# Patient Record
Sex: Male | Born: 1950 | Race: White | Hispanic: No | Marital: Single | Smoking: Never smoker
Health system: Southern US, Community
[De-identification: ages and names within clinical notes are randomized; demographics above are authoritative.]

## PROBLEM LIST (undated history)

## (undated) DIAGNOSIS — R7303 Prediabetes: Secondary | ICD-10-CM

## (undated) DIAGNOSIS — F329 Major depressive disorder, single episode, unspecified: Secondary | ICD-10-CM

## (undated) DIAGNOSIS — I9589 Other hypotension: Secondary | ICD-10-CM

## (undated) DIAGNOSIS — F988 Other specified behavioral and emotional disorders with onset usually occurring in childhood and adolescence: Secondary | ICD-10-CM

## (undated) DIAGNOSIS — M179 Osteoarthritis of knee, unspecified: Secondary | ICD-10-CM

## (undated) DIAGNOSIS — F32A Depression, unspecified: Secondary | ICD-10-CM

## (undated) DIAGNOSIS — I4891 Unspecified atrial fibrillation: Secondary | ICD-10-CM

## (undated) DIAGNOSIS — M1710 Unilateral primary osteoarthritis, unspecified knee: Secondary | ICD-10-CM

## (undated) DIAGNOSIS — G4730 Sleep apnea, unspecified: Secondary | ICD-10-CM

## (undated) DIAGNOSIS — I350 Nonrheumatic aortic (valve) stenosis: Secondary | ICD-10-CM

## (undated) DIAGNOSIS — I4892 Unspecified atrial flutter: Secondary | ICD-10-CM

## (undated) DIAGNOSIS — E7800 Pure hypercholesterolemia, unspecified: Secondary | ICD-10-CM

## (undated) DIAGNOSIS — J189 Pneumonia, unspecified organism: Secondary | ICD-10-CM

## (undated) DIAGNOSIS — J45909 Unspecified asthma, uncomplicated: Secondary | ICD-10-CM

## (undated) DIAGNOSIS — E010 Iodine-deficiency related diffuse (endemic) goiter: Secondary | ICD-10-CM

## (undated) DIAGNOSIS — Z87442 Personal history of urinary calculi: Secondary | ICD-10-CM

## (undated) DIAGNOSIS — I499 Cardiac arrhythmia, unspecified: Secondary | ICD-10-CM

## (undated) DIAGNOSIS — D696 Thrombocytopenia, unspecified: Secondary | ICD-10-CM

## (undated) DIAGNOSIS — K8020 Calculus of gallbladder without cholecystitis without obstruction: Secondary | ICD-10-CM

## (undated) DIAGNOSIS — I8390 Asymptomatic varicose veins of unspecified lower extremity: Secondary | ICD-10-CM

## (undated) DIAGNOSIS — I951 Orthostatic hypotension: Secondary | ICD-10-CM

## (undated) DIAGNOSIS — R918 Other nonspecific abnormal finding of lung field: Secondary | ICD-10-CM

## (undated) DIAGNOSIS — Z8711 Personal history of peptic ulcer disease: Secondary | ICD-10-CM

## (undated) HISTORY — DX: Unspecified atrial fibrillation: I48.91

## (undated) HISTORY — DX: Asymptomatic varicose veins of unspecified lower extremity: I83.90

## (undated) HISTORY — PX: KNEE ARTHROSCOPY: SHX127

## (undated) HISTORY — DX: Sleep apnea, unspecified: G47.30

## (undated) HISTORY — DX: Iodine-deficiency related diffuse (endemic) goiter: E01.0

## (undated) HISTORY — DX: Other specified behavioral and emotional disorders with onset usually occurring in childhood and adolescence: F98.8

## (undated) HISTORY — DX: Pure hypercholesterolemia, unspecified: E78.00

## (undated) HISTORY — DX: Thrombocytopenia, unspecified: D69.6

## (undated) HISTORY — PX: GASTRIC ROUX-EN-Y: SHX5262

## (undated) HISTORY — DX: Osteoarthritis of knee, unspecified: M17.9

## (undated) HISTORY — DX: Major depressive disorder, single episode, unspecified: F32.9

## (undated) HISTORY — PX: ULNAR NERVE TRANSPOSITION: SHX2595

## (undated) HISTORY — DX: Nonrheumatic aortic (valve) stenosis: I35.0

## (undated) HISTORY — DX: Unilateral primary osteoarthritis, unspecified knee: M17.10

## (undated) HISTORY — DX: Prediabetes: R73.03

## (undated) HISTORY — DX: Orthostatic hypotension: I95.1

## (undated) HISTORY — DX: Depression, unspecified: F32.A

## (undated) HISTORY — DX: Unspecified atrial flutter: I48.92

## (undated) HISTORY — PX: TONSILLECTOMY: SUR1361

## (undated) HISTORY — DX: Other nonspecific abnormal finding of lung field: R91.8

## (undated) HISTORY — DX: Calculus of gallbladder without cholecystitis without obstruction: K80.20

---

## 2005-07-24 DIAGNOSIS — F419 Anxiety disorder, unspecified: Secondary | ICD-10-CM

## 2005-07-24 HISTORY — DX: Anxiety disorder, unspecified: F41.9

## 2006-04-04 ENCOUNTER — Encounter: Admission: RE | Admit: 2006-04-04 | Discharge: 2006-04-04 | Payer: Self-pay | Admitting: Geriatric Medicine

## 2006-09-06 ENCOUNTER — Ambulatory Visit (HOSPITAL_COMMUNITY): Admission: RE | Admit: 2006-09-06 | Discharge: 2006-09-06 | Payer: Self-pay | Admitting: Geriatric Medicine

## 2006-12-12 ENCOUNTER — Encounter: Admission: RE | Admit: 2006-12-12 | Discharge: 2006-12-12 | Payer: Self-pay | Admitting: Geriatric Medicine

## 2007-05-21 ENCOUNTER — Encounter: Admission: RE | Admit: 2007-05-21 | Discharge: 2007-05-21 | Payer: Self-pay | Admitting: Geriatric Medicine

## 2008-09-23 ENCOUNTER — Ambulatory Visit: Payer: Self-pay | Admitting: Vascular Surgery

## 2008-11-11 ENCOUNTER — Ambulatory Visit: Payer: Self-pay | Admitting: Vascular Surgery

## 2008-11-18 ENCOUNTER — Ambulatory Visit: Payer: Self-pay | Admitting: Vascular Surgery

## 2008-11-25 ENCOUNTER — Ambulatory Visit: Payer: Self-pay | Admitting: Vascular Surgery

## 2008-12-02 ENCOUNTER — Ambulatory Visit: Payer: Self-pay | Admitting: Vascular Surgery

## 2009-05-20 ENCOUNTER — Inpatient Hospital Stay (HOSPITAL_COMMUNITY): Admission: EM | Admit: 2009-05-20 | Discharge: 2009-05-24 | Payer: Self-pay | Admitting: Emergency Medicine

## 2009-05-20 ENCOUNTER — Ambulatory Visit: Payer: Self-pay | Admitting: Cardiovascular Disease

## 2009-05-20 DIAGNOSIS — J939 Pneumothorax, unspecified: Secondary | ICD-10-CM

## 2009-05-20 HISTORY — DX: Pneumothorax, unspecified: J93.9

## 2009-05-21 ENCOUNTER — Encounter (INDEPENDENT_AMBULATORY_CARE_PROVIDER_SITE_OTHER): Payer: Self-pay | Admitting: General Surgery

## 2009-05-26 ENCOUNTER — Ambulatory Visit: Payer: Self-pay | Admitting: Cardiovascular Disease

## 2009-05-28 ENCOUNTER — Telehealth: Payer: Self-pay | Admitting: Cardiovascular Disease

## 2009-05-31 ENCOUNTER — Ambulatory Visit: Payer: Self-pay | Admitting: Cardiovascular Disease

## 2009-05-31 ENCOUNTER — Encounter (INDEPENDENT_AMBULATORY_CARE_PROVIDER_SITE_OTHER): Payer: Self-pay | Admitting: *Deleted

## 2009-05-31 DIAGNOSIS — G4733 Obstructive sleep apnea (adult) (pediatric): Secondary | ICD-10-CM | POA: Insufficient documentation

## 2009-05-31 DIAGNOSIS — Z8639 Personal history of other endocrine, nutritional and metabolic disease: Secondary | ICD-10-CM

## 2009-05-31 DIAGNOSIS — Z862 Personal history of diseases of the blood and blood-forming organs and certain disorders involving the immune mechanism: Secondary | ICD-10-CM | POA: Insufficient documentation

## 2009-05-31 DIAGNOSIS — F988 Other specified behavioral and emotional disorders with onset usually occurring in childhood and adolescence: Secondary | ICD-10-CM | POA: Insufficient documentation

## 2009-05-31 DIAGNOSIS — E785 Hyperlipidemia, unspecified: Secondary | ICD-10-CM | POA: Insufficient documentation

## 2009-05-31 DIAGNOSIS — K219 Gastro-esophageal reflux disease without esophagitis: Secondary | ICD-10-CM | POA: Insufficient documentation

## 2009-05-31 DIAGNOSIS — F32A Depression, unspecified: Secondary | ICD-10-CM | POA: Insufficient documentation

## 2009-05-31 DIAGNOSIS — F341 Dysthymic disorder: Secondary | ICD-10-CM | POA: Insufficient documentation

## 2009-10-21 ENCOUNTER — Inpatient Hospital Stay (HOSPITAL_COMMUNITY): Admission: EM | Admit: 2009-10-21 | Discharge: 2009-10-23 | Payer: Self-pay | Admitting: Emergency Medicine

## 2010-06-29 ENCOUNTER — Encounter (INDEPENDENT_AMBULATORY_CARE_PROVIDER_SITE_OTHER): Payer: Self-pay | Admitting: *Deleted

## 2010-08-23 NOTE — Miscellaneous (Signed)
Summary: update med  Clinical Lists Changes  Medications: Changed medication from ATENOLOL 50 MG TABS (ATENOLOL) Take one tablet by mouth daily to ATENOLOL 25 MG TABS (ATENOLOL) Take one tablet by mouth daily

## 2010-10-12 LAB — CBC
HCT: 31.1 % — ABNORMAL LOW (ref 39.0–52.0)
HCT: 33.3 % — ABNORMAL LOW (ref 39.0–52.0)
Hemoglobin: 10.4 g/dL — ABNORMAL LOW (ref 13.0–17.0)
Hemoglobin: 10.6 g/dL — ABNORMAL LOW (ref 13.0–17.0)
Hemoglobin: 11.2 g/dL — ABNORMAL LOW (ref 13.0–17.0)
Hemoglobin: 11.4 g/dL — ABNORMAL LOW (ref 13.0–17.0)
MCHC: 34.1 g/dL (ref 30.0–36.0)
MCHC: 34.2 g/dL (ref 30.0–36.0)
MCHC: 34.3 g/dL (ref 30.0–36.0)
MCV: 94.6 fL (ref 78.0–100.0)
MCV: 94.8 fL (ref 78.0–100.0)
Platelets: 202 10*3/uL (ref 150–400)
Platelets: 210 10*3/uL (ref 150–400)
Platelets: 218 10*3/uL (ref 150–400)
RBC: 3.21 MIL/uL — ABNORMAL LOW (ref 4.22–5.81)
RBC: 3.58 MIL/uL — ABNORMAL LOW (ref 4.22–5.81)
RDW: 14.2 % (ref 11.5–15.5)
RDW: 14.3 % (ref 11.5–15.5)
WBC: 6.3 10*3/uL (ref 4.0–10.5)
WBC: 6.6 10*3/uL (ref 4.0–10.5)
WBC: 6.7 10*3/uL (ref 4.0–10.5)
WBC: 6.9 10*3/uL (ref 4.0–10.5)

## 2010-10-12 LAB — BASIC METABOLIC PANEL
GFR calc non Af Amer: >60 mL/min (ref >60)
Potassium: 4.4 mEq/L (ref 3.5–5.1)
Sodium: 142 mEq/L (ref 135–145)

## 2010-10-12 LAB — COMPREHENSIVE METABOLIC PANEL
ALT: 20 U/L (ref 0–53)
CO2: 29 mEq/L (ref 19–32)
Calcium: 9.6 mg/dL (ref 8.4–10.5)
Chloride: 104 mEq/L (ref 96–112)
Creatinine, Ser: 0.9 mg/dL (ref 0.4–1.5)
GFR calc non Af Amer: >60 mL/min (ref >60)
Glucose, Bld: 87 mg/dL (ref 70–99)
Potassium: 4.8 mEq/L (ref 3.5–5.1)
Sodium: 138 mEq/L (ref 135–145)
Total Bilirubin: 0.7 mg/dL (ref 0.3–1.2)

## 2010-10-12 LAB — GLUCOSE, CAPILLARY
Glucose-Capillary: 80 mg/dL (ref 70–99)
Glucose-Capillary: 85 mg/dL (ref 70–99)

## 2010-10-12 LAB — CORTISOL: Cortisol, Plasma: 9.8 ug/dL

## 2010-10-12 LAB — PROTIME-INR: INR: 1.2 (ref 0.00–1.49)

## 2010-10-12 LAB — TSH
TSH: 1.492 u[IU]/mL (ref 0.350–4.500)
TSH: 1.736 u[IU]/mL (ref 0.350–4.500)

## 2010-10-16 LAB — HEMOCCULT GUIAC POC 1CARD (OFFICE): Fecal Occult Bld: POSITIVE

## 2010-10-16 LAB — ABO/RH: ABO/RH(D): O POS

## 2010-10-16 LAB — POCT I-STAT, CHEM 8
HCT: 39 % (ref 39.0–52.0)
Hemoglobin: 13.3 g/dL (ref 13.0–17.0)
Potassium: 4.2 mEq/L (ref 3.5–5.1)
Sodium: 139 mEq/L (ref 135–145)
TCO2: 30 mmol/L (ref 0–100)

## 2010-10-16 LAB — TYPE AND SCREEN
ABO/RH(D): O POS
Antibody Screen: NEGATIVE

## 2010-10-16 LAB — HEPATIC FUNCTION PANEL
ALT: 21 U/L (ref 0–53)
AST: 23 U/L (ref 0–37)
Albumin: 4.6 g/dL (ref 3.5–5.2)
Bilirubin, Direct: 0.1 mg/dL (ref 0.0–0.3)
Total Bilirubin: 0.6 mg/dL (ref 0.3–1.2)

## 2010-10-16 LAB — URINALYSIS, ROUTINE W REFLEX MICROSCOPIC
Nitrite: NEGATIVE
Specific Gravity, Urine: 1.024 (ref 1.005–1.030)
pH: 5.5 (ref 5.0–8.0)

## 2010-10-16 LAB — URINE MICROSCOPIC-ADD ON

## 2010-10-16 LAB — DIFFERENTIAL
Basophils Absolute: 0 10*3/uL (ref 0.0–0.1)
Basophils Relative: 0 % (ref 0–1)
Lymphocytes Relative: 24 % (ref 12–46)
Monocytes Absolute: 0.5 10*3/uL (ref 0.1–1.0)
Monocytes Relative: 6 % (ref 3–12)
Neutro Abs: 5.1 10*3/uL (ref 1.7–7.7)
Neutrophils Relative %: 69 % (ref 43–77)

## 2010-10-16 LAB — POCT CARDIAC MARKERS
CKMB, poc: <1 ng/mL — ABNORMAL LOW (ref 1.0–8.0)
Troponin i, poc: <0.05 ng/mL (ref 0.00–0.09)

## 2010-10-16 LAB — CBC
Hemoglobin: 12.2 g/dL — ABNORMAL LOW (ref 13.0–17.0)
RBC: 3.77 MIL/uL — ABNORMAL LOW (ref 4.22–5.81)

## 2010-10-16 LAB — PROTIME-INR: INR: 1.04 (ref 0.00–1.49)

## 2010-10-16 LAB — URINE CULTURE

## 2010-10-27 LAB — BASIC METABOLIC PANEL
BUN: 16 mg/dL (ref 6–23)
CO2: 29 mEq/L (ref 19–32)
Calcium: 9 mg/dL (ref 8.4–10.5)
Chloride: 102 mEq/L (ref 96–112)
Chloride: 107 mEq/L (ref 96–112)
Creatinine, Ser: 0.79 mg/dL (ref 0.4–1.5)
GFR calc Af Amer: >60 mL/min (ref >60)
GFR calc Af Amer: >60 mL/min (ref >60)
GFR calc non Af Amer: >60 mL/min (ref >60)
Glucose, Bld: 103 mg/dL — ABNORMAL HIGH (ref 70–99)
Potassium: 4 mEq/L (ref 3.5–5.1)
Sodium: 138 mEq/L (ref 135–145)

## 2010-10-27 LAB — CBC
HCT: 41.7 % (ref 39.0–52.0)
Hemoglobin: 14.4 g/dL (ref 13.0–17.0)
MCHC: 34.5 g/dL (ref 30.0–36.0)
Platelets: 161 10*3/uL (ref 150–400)
RBC: 4.43 MIL/uL (ref 4.22–5.81)
RBC: 4.98 MIL/uL (ref 4.22–5.81)
WBC: 7.7 10*3/uL (ref 4.0–10.5)

## 2010-10-27 LAB — DIFFERENTIAL
Eosinophils Absolute: 0.2 10*3/uL (ref 0.0–0.7)
Lymphocytes Relative: 22 % (ref 12–46)
Lymphs Abs: 1.7 10*3/uL (ref 0.7–4.0)
Monocytes Relative: 8 % (ref 3–12)
Neutro Abs: 5.3 10*3/uL (ref 1.7–7.7)
Neutrophils Relative %: 68 % (ref 43–77)

## 2010-12-06 NOTE — Consult Note (Signed)
NEW PATIENT CONSULTATION   CAREY, JOHNDROW  DOB:  1950/10/07                                       09/23/2008  ZOXWR#:[id number]   The patient presents today for evaluation of bilateral severe varicose  veins.  He is a very pleasant 60 year old gentleman with progressively  severe bilateral lower extremity varicosities.  He reports that these  have been present since his teens to 89s.  He has had progressive pain  over the past several years with more pain specifically around his left  medial calf and ankle and has had two episodes of bleeding from his  varicosities at the level of his ankle on the left.  He does have very  strong family history of venous varicosities in his family, does have a  history of arterial insufficiency with eventual amputation in his  mother.  He reports that he does have pain specifically over the  varicosity and also a heavy crampy, achy sensation over his calves with  prolonged standing. His job in Therapist, sports requires prolonged  standing and walking, and these activities are very difficult due to  bilateral leg pain and swelling. Mr. Bautch also reports housework,  yardwork and exercise are also very difficult for him secondary to leg  pain and swelling.  He does have swelling and does have changes of  chronic venous hypertension, more particularly in his left than his  right leg.  He does wear compression garments and has so for 2 years.  These were prescribed by Dr. Aldean Baker.  He elevates legs when  possible and does take nonsteroidal inflammatories for discomfort when  possible.   PAST HISTORY:  Negative for diabetes, hypertension, elevated cholesterol  or cardiac disease.   SOCIAL HISTORY:  He is single.  He works Therapist, sports.  He does  not smoke, drink alcohol.  He did have a gastric bypass in the last 1-2  years and has lost weight, down to 260 pounds, he is 6 feet 4 inches  tall.  He denies any cardiac,  pulmonary or GI difficulties.  He does  have a history of kidney stones in the past.  He does have occasional  dizziness, arthritis and depression and nervousness.   ALLERGIES:  Aspirin.   PHYSICAL EXAMINATION:  A well-developed, well-nourished white male  appearing stated age 29.  His radial and dorsalis pedis pulses are 2+  bilaterally.  He has huge bilateral venous varicosities, these are most  prominent in his right thigh and of both calves.  He does have  thickening and a hemosiderin deposit at the level of his left ankle.   He underwent a noninvasive vascular laboratory study in our office that  reveals gross reflux through his saphenous vein bilaterally. He does  have mild reflux in common femoral veins bilaterally, and he does have  reflux in his left femoral vein.  I had a long discussion with the  patient regarding his venous pathology.  He clearly has failed  conservative therapy.  I have recommended staged bilateral laser  ablation of his great saphenous vein and stab phlebectomy of his  multiple varicosities.  He reports that this procedure had been denied  by his insurance carrier a year ago.  He had seen Dr. Chapman Moss at [facility].  I am quite surprised at this.  He clearly has  gross reflux  with progressive stage IV venous hypertension.  He has also had two  episodes of bleeding secondary to his varicosities.  He understands the  procedure and we will attempt to assure insurance coverage for him and  then proceed with staged bilateral left then right leg laser ablation  with stab phlebectomy.   Larina Earthly, M.D.  Electronically Signed   TFE/MEDQ  D:  09/23/2008  T:  09/24/2008  Job:  [id number]   cc:   Hal T. Stoneking, M.D.  Nadara Mustard, MD

## 2010-12-06 NOTE — Assessment & Plan Note (Signed)
OFFICE VISIT   RAJIV, PARLATO  DOB:  [birth year]                                       12/02/2008  CHART#:[id number]   The patient presents today in 1 week followup of his right great  saphenous vein laser ablation and stab phlebectomy of multiple very  large tributary varicosities throughout his thigh and calf.  He has the  usual amount of soreness and bruising, and this is resolving.  He  underwent a duplex today and this reveals closure of his great saphenous  vein.  He does have an anterior branch that was not treated that does  remain obviously with flow.  This does not appear to be causing any  difficulty currently.  He does have some known reflux in his small  saphenous veins bilaterally with some tributaries off of these, which  are also is not causing any pain currently.  He will continue his usual  activities and will see [place] again in 6 weeks for final followup.  He will  wear his compression garment right leg for 1 additional week.   Larina Earthly, M.D.  Electronically Signed   TFE/MEDQ  D:  12/02/2008  T:  12/03/2008  Job:  [id number]   cc:   Hal T. Stoneking, M.D.

## 2010-12-06 NOTE — Assessment & Plan Note (Signed)
OFFICE VISIT   ARYEH, BUTTERFIELD  DOB:  June 14, 1951                                       11/18/2008  BJYNW#:[id number]   Patient presents today for followup of his left greater saphenous vein  laser ablation and stab phlebectomy of multiple tributary varicosities 1  week ago.  He did well with the procedure and had minimal discomfort  following.  He looks quite good today with the usual amount of bruising.   He underwent repeat duplex, and this shows closure of the saphenous vein  from the proximal calf up to the groin with no evidence of deep venous  thrombosis.  I am quite pleased with his initial result, as is the  patient.  He has severe hypertension of the right leg and wishes to  proceed as soon as possible.  We have scheduled him for Wednesday, May  5th, for right leg ablation and stab phlebectomy.   He had very large tributary varicosities which were removed with stab  phlebectomy technique, and he did have prolonged oozing from this sites  last week.  Patient understands this, and I explained to him that I  expect he will have similar prolonged oozing with this procedure as  well.  He understands this is simply treated with leg elevation and  compression, as we did last week.  He wishes to proceed on the 5th for  his right leg.   Larina Earthly, M.D.  Electronically Signed   TFE/MEDQ  D:  11/18/2008  T:  11/19/2008  Job:  [id number]   cc:   Hal T. Stoneking, M.D.

## 2010-12-06 NOTE — Procedures (Signed)
DUPLEX DEEP VENOUS EXAM - LOWER EXTREMITY   INDICATION:  Followup evaluation post right greater saphenous vein  ablation.   HISTORY:  Edema:  Mild right leg edema.  Trauma/Surgery:  Right greater saphenous vein laser ablation on  11/25/2008.  Left greater saphenous vein laser ablation on 11/11/2008.  Pain:  Right leg pain.  PE:  No.  Previous DVT:  No.  Anticoagulants:  No.  Other:   DUPLEX EXAM:                CFV   SFV   PopV  PTV    GSV                R  L  R  L  R  L  R   L  R  L  Thrombosis    0  0  0     0     0      +  Spontaneous   +  +  +     +     +      P  Phasic        +  +  +     +     +      0  Augmentation  +  +  +     +     +      0  Compressible  +  +  +     +     +      0  Competent     P  P  +     +     +      P   Legend:  + - yes  o - no  p - partial  D - decreased   IMPRESSION:  1. No evidence of right leg deep vein thrombus.  2. 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  3. The right greater saphenous vein is mostly thrombosed, however,      there is a small patent competent flow channel through the      partially thrombosed proximal greater saphenous vein to a      perforator 7 cm distal to the saphenofemoral junction.  4. The anterior branch of the greater saphenous vein is still patent      and incompetent.    _____________________________  Larina Earthly, M.D.   MC/MEDQ  D:  12/02/2008  T:  12/02/2008  Job:  [id number]

## 2010-12-06 NOTE — Procedures (Signed)
DUPLEX DEEP VENOUS EXAM - LOWER EXTREMITY   INDICATION:  Followup left greater saphenous vein ablation.   HISTORY:  Edema:  Trauma/Surgery:  Left greater saphenous vein ablation 11/11/2008.  Pain:  Right lower extremity.  PE:  No.  Previous DVT:  No.  Anticoagulants:  No.  Other:   DUPLEX EXAM:                CFV   SFV   PopV   PTV   GSV                R  L  R  L  R  L   R  L  R  L  Thrombosis    0  0     0     0      0     +  Spontaneous   +  +     +     +      +     0  Phasic        +  +     +     +      +     0  Augmentation  +  +     +     +      +     0  Compressible  +  +     +     +      +     0  Competent     0  0     D     0            0   Legend:  + - yes  o - no  p - partial  D - decreased   IMPRESSION:  1. No evidence of DVT in right common femoral vein or left lower      extremity.  2. Evidence of ablation without flow from groin to calf in left      greater saphenous vein.  Evidence of minimal flow at saphenofemoral      junction into small proximal lateral branch.  3. Evidence of thrombus in varicose veins noted on left lower      extremity.  4. Evidence of deep venous insufficiency noted in right common femoral      vein and left lower extremity.       _____________________________  Larina Earthly, M.D.   AS/MEDQ  D:  11/18/2008  T:  11/18/2008  Job:  [id number]

## 2010-12-06 NOTE — Procedures (Signed)
LOWER EXTREMITY VENOUS REFLUX EXAM   INDICATION:  Varicose veins.   EXAM:  Using color-flow imaging and pulse Doppler spectral analysis, the  bilateral common femoral, superficial femoral, popliteal, posterior  tibial, greater and lesser saphenous veins are evaluated.  There is  evidence suggesting deep venous insufficiency in the bilateral lower  extremities.   The bilateral saphenofemoral junctions are not competent.  The bilateral  GSV is not competent with the caliber as described below.   The right proximal short saphenous vein demonstrates competency.  The  left proximal short saphenous vein was not adequately visualized.   GSV Diameter (used if found to be incompetent only)                                            Right    Left  Proximal Greater Saphenous Vein           0.71 cm  0.98 cm  Proximal-to-mid-thigh                     0.73 cm  0.95 cm  Mid thigh                                 0.48 cm  1.13 cm  Mid-distal thigh                          0.42 cm  0.98 cm  Distal thigh                              0.42 cm  0.98 cm  Knee                                      0.36 cm  0.86 cm   IMPRESSION:  1. Bilateral greater saphenous vein reflux is identified with the      calibers as described above.  2. The bilateral greater saphenous veins are not tortuous.  3. The deep venous system is not competent at the bilateral common      femoral and left superficial femoral vein levels.  4. An incompetent perforator is noted at the left medial mid-to-distal      calf level, which communicates with the posterior tibial vein.       ___________________________________________  Larina Earthly, M.D.   CH/MEDQ  D:  09/23/2008  T:  09/23/2008  Job:  [id number]

## 2011-01-17 ENCOUNTER — Other Ambulatory Visit: Payer: Self-pay | Admitting: Geriatric Medicine

## 2011-01-17 DIAGNOSIS — N281 Cyst of kidney, acquired: Secondary | ICD-10-CM

## 2011-01-17 DIAGNOSIS — R911 Solitary pulmonary nodule: Secondary | ICD-10-CM

## 2011-01-17 DIAGNOSIS — E049 Nontoxic goiter, unspecified: Secondary | ICD-10-CM

## 2011-01-20 ENCOUNTER — Ambulatory Visit
Admission: RE | Admit: 2011-01-20 | Discharge: 2011-01-20 | Disposition: A | Discharge status: Home / Self Care | Payer: BC Managed Care – PPO | Source: Ambulatory Visit | Attending: Geriatric Medicine | Admitting: Geriatric Medicine

## 2011-01-20 DIAGNOSIS — N281 Cyst of kidney, acquired: Secondary | ICD-10-CM

## 2011-01-20 DIAGNOSIS — E049 Nontoxic goiter, unspecified: Secondary | ICD-10-CM

## 2011-01-20 DIAGNOSIS — R911 Solitary pulmonary nodule: Secondary | ICD-10-CM

## 2011-01-20 MED ORDER — IOHEXOL 300 MG/ML  SOLN
75.0000 mL | Freq: Once | INTRAMUSCULAR | Status: AC | PRN
  Administered 2011-01-20: 75 mL via INTRAVENOUS

## 2011-01-23 ENCOUNTER — Other Ambulatory Visit: Payer: Self-pay | Admitting: Geriatric Medicine

## 2011-01-23 DIAGNOSIS — D4100 Neoplasm of uncertain behavior of unspecified kidney: Secondary | ICD-10-CM

## 2011-01-26 ENCOUNTER — Other Ambulatory Visit: Payer: BC Managed Care – PPO

## 2011-01-26 ENCOUNTER — Ambulatory Visit
Admission: RE | Admit: 2011-01-26 | Discharge: 2011-01-26 | Disposition: A | Discharge status: Home / Self Care | Payer: BC Managed Care – PPO | Source: Ambulatory Visit | Attending: Geriatric Medicine | Admitting: Geriatric Medicine

## 2011-01-26 DIAGNOSIS — D4100 Neoplasm of uncertain behavior of unspecified kidney: Secondary | ICD-10-CM

## 2011-01-26 MED ORDER — IOHEXOL 300 MG/ML  SOLN
125.0000 mL | Freq: Once | INTRAMUSCULAR | Status: AC | PRN
  Administered 2011-01-26: 125 mL via INTRAVENOUS

## 2012-04-11 DIAGNOSIS — B009 Herpesviral infection, unspecified: Secondary | ICD-10-CM | POA: Insufficient documentation

## 2012-04-11 DIAGNOSIS — Z0000 Encounter for general adult medical examination without abnormal findings: Secondary | ICD-10-CM | POA: Insufficient documentation

## 2013-02-21 DIAGNOSIS — H40003 Preglaucoma, unspecified, bilateral: Secondary | ICD-10-CM | POA: Insufficient documentation

## 2013-02-21 DIAGNOSIS — H40009 Preglaucoma, unspecified, unspecified eye: Secondary | ICD-10-CM | POA: Insufficient documentation

## 2013-04-24 ENCOUNTER — Ambulatory Visit (HOSPITAL_COMMUNITY): Payer: BC Managed Care – PPO | Attending: Geriatric Medicine | Admitting: Radiology

## 2013-04-24 ENCOUNTER — Other Ambulatory Visit (HOSPITAL_COMMUNITY): Payer: Self-pay | Admitting: Interventional Cardiology

## 2013-04-24 DIAGNOSIS — G4733 Obstructive sleep apnea (adult) (pediatric): Secondary | ICD-10-CM | POA: Insufficient documentation

## 2013-04-24 DIAGNOSIS — I379 Nonrheumatic pulmonary valve disorder, unspecified: Secondary | ICD-10-CM | POA: Insufficient documentation

## 2013-04-24 DIAGNOSIS — I359 Nonrheumatic aortic valve disorder, unspecified: Secondary | ICD-10-CM

## 2013-04-24 DIAGNOSIS — I4891 Unspecified atrial fibrillation: Secondary | ICD-10-CM | POA: Insufficient documentation

## 2013-04-24 DIAGNOSIS — I080 Rheumatic disorders of both mitral and aortic valves: Secondary | ICD-10-CM | POA: Insufficient documentation

## 2013-04-24 DIAGNOSIS — R55 Syncope and collapse: Secondary | ICD-10-CM | POA: Insufficient documentation

## 2013-04-24 DIAGNOSIS — E785 Hyperlipidemia, unspecified: Secondary | ICD-10-CM | POA: Insufficient documentation

## 2013-04-24 NOTE — Progress Notes (Signed)
Echocardiogram performed.  

## 2013-04-25 NOTE — Progress Notes (Signed)
lmtrc

## 2014-08-03 DIAGNOSIS — K279 Peptic ulcer, site unspecified, unspecified as acute or chronic, without hemorrhage or perforation: Secondary | ICD-10-CM | POA: Insufficient documentation

## 2014-08-03 DIAGNOSIS — G4730 Sleep apnea, unspecified: Secondary | ICD-10-CM | POA: Insufficient documentation

## 2014-08-03 DIAGNOSIS — I480 Paroxysmal atrial fibrillation: Secondary | ICD-10-CM | POA: Insufficient documentation

## 2014-09-01 ENCOUNTER — Telehealth: Payer: Self-pay | Admitting: Interventional Cardiology

## 2014-09-01 NOTE — Telephone Encounter (Signed)
New Msg         Pt would like to speak with a nurse, no details given.      Please return call.

## 2014-09-01 NOTE — Telephone Encounter (Signed)
Spoke with pt and he stated that he would really like to be seen before his appointment in March. Pt concerned about Afib and states that he had an episode in October. Offered pt 09/02/14 at 2:30pm and pt was in agreement with this. Appt scheduled.

## 2014-09-02 ENCOUNTER — Ambulatory Visit (INDEPENDENT_AMBULATORY_CARE_PROVIDER_SITE_OTHER): Payer: BLUE CROSS/BLUE SHIELD | Admitting: Interventional Cardiology

## 2014-09-02 ENCOUNTER — Encounter: Payer: Self-pay | Admitting: Interventional Cardiology

## 2014-09-02 VITALS — BP 120/80 | HR 102 | Ht 76.0 in | Wt 305.0 lb

## 2014-09-02 DIAGNOSIS — I481 Persistent atrial fibrillation: Secondary | ICD-10-CM

## 2014-09-02 DIAGNOSIS — G4730 Sleep apnea, unspecified: Secondary | ICD-10-CM

## 2014-09-02 DIAGNOSIS — I4819 Other persistent atrial fibrillation: Secondary | ICD-10-CM

## 2014-09-02 DIAGNOSIS — I4891 Unspecified atrial fibrillation: Secondary | ICD-10-CM | POA: Insufficient documentation

## 2014-09-02 DIAGNOSIS — G4733 Obstructive sleep apnea (adult) (pediatric): Secondary | ICD-10-CM

## 2014-09-02 MED ORDER — METOPROLOL TARTRATE 25 MG PO TABS: 25.0000 mg | ORAL_TABLET | Freq: Two times a day (BID) | ORAL | Status: DC

## 2014-09-02 NOTE — Patient Instructions (Addendum)
Your physician has recommended you make the following change in your medication:  1) START taking Metoprolol Tartrate 25mg  twice daily  Your physician has requested that you have an echocardiogram. Echocardiography is a painless test that uses sound waves to create images of your heart. It provides your doctor with information about the size and shape of your heart and how well your heart's chambers and valves are working. This procedure takes approximately one hour. There are no restrictions for this procedure.  Your physician has recommended that you have a sleep study. This test records several body functions during sleep, including: brain activity, eye movement, oxygen and carbon dioxide blood levels, heart rate and rhythm, breathing rate and rhythm, the flow of air through your mouth and nose, snoring, body muscle movements, and chest and belly movement.  Your physician recommends that you schedule a follow-up appointment in: 2 weeks with EKG with Flex to check heart rhythm.

## 2014-09-02 NOTE — Progress Notes (Signed)
Patient ID: Francisco Padilla, male   DOB: 06/13/1951, 64 y.o.   MRN: [id number]    [place] [place], [place]  [address] Phone: [phone] Fax:  [phone]  Date:  09/02/2014   ID:  Francisco Padilla, DOB 19-Aug-1950, MRN [id number]  PCP:  Mathews Argyle, MD      History of Present Illness: Francisco Padilla is a 64 y.o. male who has a h/o PAF, at the time of GI bleed in 9/14.  He had a prior episode of GI bleeding years before.  He was not started on anticoagulation due to bleeding risk.  He has had gastric bypass in 2008, and was told to never take aspirin or NSAIDs.    The patient was told he was in AFib at the [facility] in 10/15.  He has had more episodes of AFib  He has seen Dr. Howell Rucks for GI issues.  The patient is hesitant to take a blood thinner due to the risk of bleeding.  His MD at [facility] Dr. Sharlette Dense, phone [phone], who did his surgery.  The patient has gained some weight back in the past few years.     Wt Readings from Last 3 Encounters:  09/02/14 305 lb (138.347 kg)  05/31/09 267 lb 8 oz (121.337 kg)     Past Medical History  Diagnosis Date  . Hypercholesterolemia   . Hypertension   . DJD (degenerative joint disease) of knee   . Varicose veins   . Depression   . ADD (attention deficit disorder)   . Coronary artery disease   . Nephrolithiasis   . Sleep apnea   . Atrial flutter   . Prediabetes   . Thrombocytopenia   . Pulmonary nodules   . Orthostatic hypotension   . Atrial fibrillation   . Thyromegaly   . Asymptomatic gallstones   . Aortic stenosis     Current Outpatient Prescriptions  Medication Sig Dispense Refill  . acetaminophen (TYLENOL) 500 MG tablet Take 500 mg by mouth every 6 (six) hours as needed for moderate pain.    Marland Kitchen atorvastatin (LIPITOR) 40 MG tablet Take 40 mg by mouth. ONCE A DAY    . buPROPion (WELLBUTRIN XL) 300 MG 24 hr tablet Take 300 mg by mouth. Q AM    . diphenhydramine-acetaminophen (TYLENOL  PM) 25-500 MG TABS Take 1 tablet by mouth at bedtime as needed.    Marland Kitchen LORazepam (ATIVAN) 0.5 MG tablet Take 0.5 mg by mouth every 6 (six) hours as needed.   0  . Multiple Vitamins-Minerals (ONE DAILY MULTIVITAMIN MEN) TABS Take by mouth. QD    . pantoprazole (PROTONIX) 40 MG tablet Take 40 mg by mouth 2 (two) times daily.     Marland Kitchen zolpidem (AMBIEN) 10 MG tablet Take 10 mg by mouth at bedtime.  0  . metoprolol tartrate (LOPRESSOR) 25 MG tablet Take 1 tablet (25 mg total) by mouth 2 (two) times daily. 60 tablet 6   No current facility-administered medications for this visit.    Allergies:    Allergies  Allergen Reactions  . Aspirin   . Nsaids   . Oxycodone Hcl     Social History:  The patient  reports that he has never smoked. He has never used smokeless tobacco.   Family History:  The patient's family history includes Heart disease in his father and mother.   ROS:  Please see the history of present illness.  No nausea,  vomiting.  No fevers, chills.  No focal weakness.  No dysuria.    All other systems reviewed and negative.   PHYSICAL EXAM: VS:  BP 120/80 mmHg  Pulse 102  Ht 6\' 4"  (1.93 m)  Wt 305 lb (138.347 kg)  BMI 37.14 kg/m2 General: Well developed, well nourished, in no acute distress HEENT: normal Neck: no JVD, no carotid bruits Cardiac:  normal S1, S2; RRR;  Lungs:  clear to auscultation bilaterally, no wheezing, rhonchi or rales Abd: soft, nontender, no hepatomegaly Ext: no edema Skin: warm and dry Neuro:   no focal abnormalities noted Psych: normal affect  EKG:  AFib with borderline rate control     ASSESSMENT AND PLAN:  1. AFib: Becoming more persistent.  Start metoprolol 25 mg BID.  He would benefit from anticoagulation for stroke reduction.  He cannot take aspirin.  I would like to check with his GI MD and weight loss surgeon whether or not they would approve Eliquis for stroke prevention.  Check reate control in 2 weeks.  Continue to use CPAP.  He would like a  sleep study since it has been 8 years since he was titrated.  Spoke with Dr. Laurena Spies at [facility] (on call with Dr. Erline Levine), who recommended consideration of EGD to r/o active ulcer prior to stating Eliquis.  Will check with Dr. Wynetta Emery.  2. Obesity: He has gained back a significant amount of weight after his gastric bypass surgery.   3. He does not have many RF for stroke in the setting of AFib. However, he will not take aspirin.  He would prefer to take something for stroke prevention if okay from a GI standpoint. 4. 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  Signed, Mina Marble, MD, [facility] 09/02/2014 5:30 PM

## 2014-09-07 ENCOUNTER — Ambulatory Visit (HOSPITAL_COMMUNITY): Payer: BLUE CROSS/BLUE SHIELD

## 2014-09-10 ENCOUNTER — Telehealth: Payer: Self-pay | Admitting: Interventional Cardiology

## 2014-09-10 NOTE — Telephone Encounter (Signed)
I left a message for the patient to call. 

## 2014-09-10 NOTE — Telephone Encounter (Signed)
F/U       Pt is returning call, states he doesn't need to have a call back.   He is aware of the procedure and knows how to prepare.

## 2014-09-10 NOTE — Telephone Encounter (Signed)
Noted  

## 2014-09-10 NOTE — Telephone Encounter (Signed)
New Msg         Pt calling to get instructions about Echo scheduled for tomorrow.   Pt request to speak with nurse.   Please return call.

## 2014-09-11 ENCOUNTER — Ambulatory Visit (HOSPITAL_COMMUNITY): Payer: BLUE CROSS/BLUE SHIELD | Attending: Interventional Cardiology | Admitting: Cardiology

## 2014-09-11 DIAGNOSIS — I10 Essential (primary) hypertension: Secondary | ICD-10-CM | POA: Diagnosis not present

## 2014-09-11 DIAGNOSIS — E785 Hyperlipidemia, unspecified: Secondary | ICD-10-CM | POA: Insufficient documentation

## 2014-09-11 DIAGNOSIS — E669 Obesity, unspecified: Secondary | ICD-10-CM | POA: Diagnosis not present

## 2014-09-11 DIAGNOSIS — I4891 Unspecified atrial fibrillation: Secondary | ICD-10-CM | POA: Diagnosis not present

## 2014-09-11 NOTE — Progress Notes (Signed)
Echo performed. 

## 2014-09-15 ENCOUNTER — Encounter (HOSPITAL_BASED_OUTPATIENT_CLINIC_OR_DEPARTMENT_OTHER): Payer: BLUE CROSS/BLUE SHIELD

## 2014-09-16 ENCOUNTER — Ambulatory Visit (INDEPENDENT_AMBULATORY_CARE_PROVIDER_SITE_OTHER): Payer: BLUE CROSS/BLUE SHIELD | Admitting: Physician Assistant

## 2014-09-16 ENCOUNTER — Encounter: Payer: Self-pay | Admitting: Physician Assistant

## 2014-09-16 VITALS — BP 102/78 | HR 71 | Ht 76.0 in | Wt 307.0 lb

## 2014-09-16 DIAGNOSIS — G4733 Obstructive sleep apnea (adult) (pediatric): Secondary | ICD-10-CM

## 2014-09-16 DIAGNOSIS — E785 Hyperlipidemia, unspecified: Secondary | ICD-10-CM

## 2014-09-16 DIAGNOSIS — I480 Paroxysmal atrial fibrillation: Secondary | ICD-10-CM

## 2014-09-16 NOTE — Patient Instructions (Signed)
FOLLOW UP WITH DR. VARANASI IN 1 MONTH  NO CHANGES WERE MADE TODAY

## 2014-09-16 NOTE — Progress Notes (Signed)
Cardiology Office Note   Date:  09/16/2014   ID:  Francisco Padilla, DOB [birth year], MRN [id number]  PCP:  Mathews Argyle, MD  Cardiologist:  Dr. Casandra Doffing     Chief Complaint  Patient presents with  . Atrial Fibrillation     History of Present Illness: Francisco Padilla is a 64 y.o. male with a hx of PAF, prior GI bleed, obesity s/p gastric bypass in 2008, HL, OSA.  Seen by Dr. Casandra Doffing 09/02/14.  He was in AFib.  Metoprolol was started.  Dr. Irish Lack was in touch with his GI MD and weight loss surgeon to see if anticoagulation can be considered.  He needs a FU EGD to r/o active ulcer prior to starting anticoagulation (Eliquis).    He returns for FU.  He is overall feeling better. He feels less rapid palpitations since starting metoprolol. He denies chest pain or significant shortness of breath. He denies syncope or near-syncope. He denies orthopnea or PND. He has chronic left ankle swelling. He has not spoken with his gastroenterologist yet.   Studies/Reports Reviewed Today:  Echocardiogram 09/11/14 - Left ventricle: EF 50% to 55%. Wall motion was normal - Left atrium: The atrium was severely dilated.    Past Medical History  Diagnosis Date  . Hypercholesterolemia   . Hypertension   . DJD (degenerative joint disease) of knee   . Varicose veins   . Depression   . ADD (attention deficit disorder)   . Coronary artery disease   . Nephrolithiasis   . Sleep apnea   . Atrial flutter   . Prediabetes   . Thrombocytopenia   . Pulmonary nodules   . Orthostatic hypotension   . Atrial fibrillation   . Thyromegaly   . Asymptomatic gallstones   . Aortic stenosis     History reviewed. No pertinent past surgical history.   Current Outpatient Prescriptions  Medication Sig Dispense Refill  . acetaminophen (TYLENOL) 500 MG tablet Take 500 mg by mouth every 6 (six) hours as needed for moderate pain.    Marland Kitchen atorvastatin (LIPITOR) 40 MG tablet Take 40 mg by mouth. ONCE A DAY     . buPROPion (WELLBUTRIN XL) 300 MG 24 hr tablet Take 300 mg by mouth. Q AM    . diphenhydramine-acetaminophen (TYLENOL PM) 25-500 MG TABS Take 1 tablet by mouth at bedtime as needed.    Marland Kitchen LORazepam (ATIVAN) 0.5 MG tablet Take 0.5 mg by mouth every 6 (six) hours as needed.   0  . metoprolol tartrate (LOPRESSOR) 25 MG tablet Take 1 tablet (25 mg total) by mouth 2 (two) times daily. 60 tablet 6  . Multiple Vitamins-Minerals (ONE DAILY MULTIVITAMIN MEN) TABS Take by mouth. QD    . pantoprazole (PROTONIX) 40 MG tablet Take 40 mg by mouth 2 (two) times daily.     Marland Kitchen zolpidem (AMBIEN) 10 MG tablet Take 10 mg by mouth at bedtime.  0   No current facility-administered medications for this visit.    Allergies:   Aspirin; Nsaids; and Oxycodone hcl    Social History:  The patient  reports that he has never smoked. He has never used smokeless tobacco.   Family History:  The patient's family history includes Heart attack in his father; Heart disease in his father and mother; Heart failure in his mother. There is no history of Stroke.    ROS:   Please see the history of present illness.   Review of Systems  Constitution: Positive for malaise/fatigue.  Psychiatric/Behavioral: Positive for depression.  All other systems reviewed and are negative.    PHYSICAL EXAM: VS:  BP 102/78 mmHg  Pulse 71  Ht 6\' 4"  (1.93 m)  Wt 307 lb (139.254 kg)  BMI 37.38 kg/m2    Wt Readings from Last 3 Encounters:  09/16/14 307 lb (139.254 kg)  09/02/14 305 lb (138.347 kg)  05/31/09 267 lb 8 oz (121.337 kg)     GEN: Well nourished, well developed, in no acute distress HEENT: normal Neck: no JVD, no masses Cardiac:  Normal S1/S2, irreg irreg rhythm; no murmur, no rubs or gallops, trace R and no L edema  Respiratory:  clear to auscultation bilaterally, no wheezing, rhonchi or rales. GI: soft, nontender, nondistended, + BS MS: no deformity or atrophy Skin: warm and dry  Neuro:  CNs II-XII intact, Strength and  sensation are intact Psych: Normal affect   EKG:  EKG is ordered today.  It demonstrates:   AFib, HR 71, no change from last tracing   Recent Labs: No results found for requested labs within last 365 days.    Lipid Panel No results found for: CHOL, TRIG, HDL, CHOLHDL, VLDL, LDLCALC, LDLDIRECT    ASSESSMENT AND PLAN:  1.  Persistent Atrial Fibrillation:  Heart rate is much better controlled.  CHADS2-VASc=0.  However, there was some concern about him not being able to take aspirin. Dr. Irish Lack was going to discuss with his gastroenterologist whether or not he could take Eliquis. He apparently needs to have a follow-up EGD. I will follow up with Dr. Irish Lack on the timing of all of this. Continue current dose of metoprolol.  Overall, he does not seem to be symptomatic with AFib. 2.  Hyperlipidemia: Continue taking Lipitor and follow-up with primary care, who follows this. 3.  OSA:  Continue CPAP.   Current medicines are reviewed at length with the patient today.  The patient does not have concerns regarding medicines.  The following changes have been made:  no change  Labs/ tests ordered today include:   Orders Placed This Encounter  Procedures  . EKG 12-Lead    Disposition:   FU with Dr. Casandra Doffing  in 1 month     Signed, Versie Starks, MHS 09/16/2014 2:48 PM    [address], [facility] [place], [place]  [address] Phone: [phone]; Fax: [phone]

## 2014-09-21 ENCOUNTER — Telehealth: Payer: Self-pay | Admitting: *Deleted

## 2014-09-21 NOTE — Telephone Encounter (Signed)
lmptcb to advise per Dr. Irish Lack and Richardson Dopp, PA to have pt see GI before Dr. Irish Lack 4/6.

## 2014-09-22 NOTE — Telephone Encounter (Signed)
ptcb and has been made aware per Dr. Irish Lack and Brynda Rim. PA to follow up with GI before his appt with Dr. Irish Lack 4/6. Pt verbalized understanding to this and is agreeable to Plan of care.

## 2014-09-29 ENCOUNTER — Ambulatory Visit: Payer: Self-pay | Admitting: Interventional Cardiology

## 2014-10-28 ENCOUNTER — Encounter: Payer: Self-pay | Admitting: Interventional Cardiology

## 2014-10-28 ENCOUNTER — Ambulatory Visit (INDEPENDENT_AMBULATORY_CARE_PROVIDER_SITE_OTHER): Payer: BLUE CROSS/BLUE SHIELD | Admitting: Interventional Cardiology

## 2014-10-28 DIAGNOSIS — I481 Persistent atrial fibrillation: Secondary | ICD-10-CM | POA: Diagnosis not present

## 2014-10-28 DIAGNOSIS — R031 Nonspecific low blood-pressure reading: Secondary | ICD-10-CM | POA: Insufficient documentation

## 2014-10-28 DIAGNOSIS — I4819 Other persistent atrial fibrillation: Secondary | ICD-10-CM

## 2014-10-28 NOTE — Patient Instructions (Signed)
Your physician recommends that you continue on your current medications as directed. Please refer to the Current Medication list given to you today.     

## 2014-10-29 NOTE — Progress Notes (Signed)
Patient ID: Francisco Padilla, male   DOB: 1951-07-01, 64 y.o.   MRN: [id number]     Cardiology Office Note   Date:  10/29/2014   ID:  Francisco Padilla, DOB April 05, 1951, MRN [id number]  PCP:  Mathews Argyle, MD    No chief complaint on file.  follow-up atrial fibrillation   Wt Readings from Last 3 Encounters:  10/28/14 300 lb (136.079 kg)  09/16/14 307 lb (139.254 kg)  09/02/14 305 lb (138.347 kg)       History of Present Illness: Francisco Padilla is a 64 y.o. male  with a hx of PAF, prior GI bleed, obesity s/p gastric bypass in 2008, HL, OSA. I spoke with his weight loss surgeon to see if anticoagulation can be considered. Per the [name] weight loss surgery program, He needs a FU EGD to r/o active ulcer prior to starting anticoagulation (Eliquis).    He returns for FU. He is overall feeling better since starting metoprolol. He denies chest pain or significant shortness of breath. He denies syncope or near-syncope. He denies orthopnea or PND. He has chronic left ankle swelling. He has not spoken with his gastroenterologist yet.  He will be seeing them in a couple of days. He is very reluctant to take any type of anticoagulation given his prior ulcers.  He reports issues with anxiety and stress due to his husband's stroke.  He is the primary caretaker.       Past Medical History  Diagnosis Date  . Hypercholesterolemia   . DJD (degenerative joint disease) of knee   . Varicose veins   . Depression   . ADD (attention deficit disorder)   . Nephrolithiasis   . Sleep apnea   . Atrial flutter   . Prediabetes   . Thrombocytopenia   . Pulmonary nodules   . Orthostatic hypotension   . Atrial fibrillation   . Thyromegaly   . Asymptomatic gallstones     No past surgical history on file.   Current Outpatient Prescriptions  Medication Sig Dispense Refill  . acetaminophen (TYLENOL) 500 MG tablet Take 500 mg by mouth every 6 (six) hours as needed for moderate pain.    Marland Kitchen atorvastatin  (LIPITOR) 40 MG tablet Take 40 mg by mouth. ONCE A DAY    . buPROPion (WELLBUTRIN XL) 300 MG 24 hr tablet Take 300 mg by mouth. Q AM    . diphenhydramine-acetaminophen (TYLENOL PM) 25-500 MG TABS Take 1 tablet by mouth at bedtime as needed.    Marland Kitchen LORazepam (ATIVAN) 0.5 MG tablet Take 0.5 mg by mouth every 6 (six) hours as needed.   0  . Melatonin 5 MG TABS Take 10 mg by mouth at bedtime.    . metoprolol tartrate (LOPRESSOR) 25 MG tablet Take 1 tablet (25 mg total) by mouth 2 (two) times daily. 60 tablet 6  . Multiple Vitamins-Minerals (ONE DAILY MULTIVITAMIN MEN) TABS Take by mouth. QD    . pantoprazole (PROTONIX) 40 MG tablet Take 40 mg by mouth 2 (two) times daily.     Marland Kitchen zolpidem (AMBIEN) 10 MG tablet Take 5 mg by mouth at bedtime.   0   No current facility-administered medications for this visit.    Allergies:   Aspirin; Nsaids; and Oxycodone hcl    Social History:  The patient  reports that he has never smoked. He has never used smokeless tobacco.   Family History:  The patient's family history includes Heart attack in his father; Heart disease in his  father and mother; Heart failure in his mother. There is no history of Stroke.    ROS:  Please see the history of present illness.   Otherwise, review of systems are positive for anxiety and stress.   All other systems are reviewed and negative.    PHYSICAL EXAM: VS:  BP 120/72 mmHg  Pulse 89  Ht 6\' 4"  (1.93 m)  Wt 300 lb (136.079 kg)  BMI 36.53 kg/m2  SpO2 99% , BMI Body mass index is 36.53 kg/(m^2). GEN: Well nourished, well developed, in no acute distress HEENT: normal Neck: no JVD, carotid bruits, or masses Cardiac: Irregularly irregular; no murmurs, rubs, or gallops,no edema  Respiratory:  clear to auscultation bilaterally, normal work of breathing GI: soft, nontender, nondistended, + BS MS: no deformity or atrophy Skin: warm and dry, no rash Neuro:  Strength and sensation are intact Psych: euthymic mood, full  affect    Recent Labs: No results found for requested labs within last 365 days.   Lipid Panel No results found for: CHOL, TRIG, HDL, CHOLHDL, VLDL, LDLCALC, LDLDIRECT   Other studies Reviewed: Additional studies/ records that were reviewed today with results demonstrating: none.   ASSESSMENT AND PLAN:  1. Atrial fibrillation: He is adequately rate controlled at this time. He does have a low chads score but given his obesity and the fact that he is approaching age 26, he is certainly at risk for more points on the chads vasc score.  We have no plans of restoring sinus rhythm given his lack of symptoms. He is very reluctant to try any type of anticoagulation or antiplatelet therapy. He is also very worried about potentially having a stroke from his atrial fibrillation. I have contacted Dr. Aline Brochure at [facility] regarding considering him for a watchman device, which can be used in patients who do not tolerate anticoagulation for stroke prevention in the setting of atrial fibrillation. We will refer the patient there. He has had other medical treatment at [facility] and is comfortable going there.   Current medicines are reviewed at length with the patient today.  The patient concerns regarding his medicines were addressed.  The following changes have been made:  No change  Labs/ tests ordered today include:  No orders of the defined types were placed in this encounter.    Recommend 150 minutes/week of aerobic exercise Low fat, low carb, high fiber diet recommended  Disposition:   FU in 3 months   Teresita Madura., MD  10/29/2014 5:48 PM    [facility] [address], [place], [place]  [address] Phone: [phone]; Fax: [phone]

## 2014-11-24 ENCOUNTER — Telehealth: Payer: Self-pay | Admitting: Interventional Cardiology

## 2014-11-24 NOTE — Telephone Encounter (Signed)
Francisco Padilla is calling about the referral to Dr Jerline Pain at [facility].  She will let me know what she finds out.

## 2014-11-24 NOTE — Telephone Encounter (Signed)
New Message  Pt called states that he was told that he would have a referral sent out to Dr. Kenton Kingfisher office with [facility] cardiology. Pt states that Dr. Kenton Kingfisher office states that they have not received the referral or records. Please assist.

## 2014-11-24 NOTE — Telephone Encounter (Signed)
Spoke with Collie Siad at Dr Harrison's office.  Per Collie Siad, the patient was offered several different appointment but he didn't want to be scheduled.  She also states Dr Aline Brochure doesn't do watchman devices. Spoke with patient who also states Dr Aline Brochure doesn't do watchman devices and he wants to know why he would be sent to be seen there.  Advised I will forward this to Dr Irish Lack for further clarification and he will receive a call back once Dr Irish Lack is back next week.

## 2014-11-27 NOTE — Telephone Encounter (Signed)
When I spoke to Dr. Aline Brochure, he told me that the Watchman device was done at [facility].  I am not sure if he does it or one of his partners does it.  THe point of the referral is to offer him stroke prevention since he is not a candidate for standard oral anticoagulation.

## 2014-11-30 NOTE — Telephone Encounter (Addendum)
Spoke with Collie Siad at Dr. Ruthe Mannan office and informed her of what Dr. Irish Lack had mentioned in the previous message. Collie Siad states that the problem is actually that the pt doesn't want to be seen. Collie Siad states that pt stated that he has to come to [name] to see a psychiatrist and was having some issues with a family member's health and that he didn't want to be seen right now if it wasn't urgent. Informed Collie Siad that I would call and speak with pt and she said that if pt agrees to come in to call her back and she will get him scheduled.

## 2014-11-30 NOTE — Telephone Encounter (Addendum)
Left message for Collie Siad from Dr. Ruthe Mannan office to call back- Number to Dr. Ruthe Mannan office [phone]

## 2014-11-30 NOTE — Telephone Encounter (Signed)
Spoke with pt in regards to seeing someone at [facility]. Pt states that the reason he was not making an appt at [facility] at this time was because he wanted to be sure that Dr. Irish Lack was aware of the fact that Dr. Aline Brochure doesn't do the Watchman devices and see if he still wanted him to proceed with this recommendation. Informed pt that Dr. Irish Lack spoke with Dr. Aline Brochure and that Dr. Aline Brochure had said the Watchman devices were done at [facility] but that he didn't know exactly which doctors did the procedures. Informed pt that Dr. Irish Lack would still like for pt to proceed with this. Pt verbalized understanding and was in agreement with this plan. Informed pt that I would contact Collie Siad at Dr. Ruthe Mannan office and let her know that he was agreeable and that she would contact him to make an appt. Called and spoke with Collie Siad and made her aware that pt is willing to proceed. Collie Siad states that she will speak with Dr. Aline Brochure and see if he wants to see the pt first or if he would like to go ahead and refer him over to someone that does the [place] devices. Collie Siad states that she will contact pt with information and to make an appt.

## 2014-11-30 NOTE — Telephone Encounter (Signed)
Follow Up       Returning [name]'s phone call.

## 2014-12-01 ENCOUNTER — Telehealth: Payer: Self-pay | Admitting: Interventional Cardiology

## 2014-12-01 NOTE — Telephone Encounter (Signed)
Spoke with Juliann Pulse at Dr. Leone Brand office and she states that she is aware of referral for pt and that she tried to call but had to leave a voicemail and pt had not returned call at this time. Informed Juliann Pulse that I had attempted as well and that if he called me back first that I would have him call her. Juliann Pulse verbalized understanding.

## 2014-12-01 NOTE — Telephone Encounter (Signed)
New message      The physician this pt needs to see is Dr Frederich Chick.  His number is [phone] and his assistant is Juliann Pulse.  She said you will know what this is about

## 2014-12-01 NOTE — Telephone Encounter (Signed)
Spoke with Francisco Padilla and informed him of Dr. Ruthe Mannan suggestion for him to see Dr. Meyer Cory. Informed Francisco Padilla that Juliann Pulse from Dr. Leone Brand office had attempted to reach him as well to schedule an appt. Francisco Padilla states that he did get that message and that he would call her right now to schedule an appt.

## 2014-12-01 NOTE — Telephone Encounter (Signed)
Follow Up     Pt returning [name]'s phone call.

## 2014-12-01 NOTE — Telephone Encounter (Signed)
Left message for pt to call back  °

## 2014-12-14 DIAGNOSIS — R7303 Prediabetes: Secondary | ICD-10-CM | POA: Insufficient documentation

## 2014-12-14 DIAGNOSIS — I10 Essential (primary) hypertension: Secondary | ICD-10-CM | POA: Insufficient documentation

## 2014-12-14 DIAGNOSIS — N200 Calculus of kidney: Secondary | ICD-10-CM | POA: Insufficient documentation

## 2014-12-14 DIAGNOSIS — Z8719 Personal history of other diseases of the digestive system: Secondary | ICD-10-CM | POA: Insufficient documentation

## 2014-12-14 DIAGNOSIS — I2510 Atherosclerotic heart disease of native coronary artery without angina pectoris: Secondary | ICD-10-CM | POA: Insufficient documentation

## 2014-12-14 DIAGNOSIS — D696 Thrombocytopenia, unspecified: Secondary | ICD-10-CM | POA: Insufficient documentation

## 2014-12-14 DIAGNOSIS — I350 Nonrheumatic aortic (valve) stenosis: Secondary | ICD-10-CM | POA: Insufficient documentation

## 2014-12-14 DIAGNOSIS — R918 Other nonspecific abnormal finding of lung field: Secondary | ICD-10-CM | POA: Insufficient documentation

## 2014-12-14 DIAGNOSIS — Y9389 Activity, other specified: Secondary | ICD-10-CM | POA: Insufficient documentation

## 2014-12-14 DIAGNOSIS — M1990 Unspecified osteoarthritis, unspecified site: Secondary | ICD-10-CM | POA: Insufficient documentation

## 2014-12-14 DIAGNOSIS — I4892 Unspecified atrial flutter: Secondary | ICD-10-CM | POA: Insufficient documentation

## 2014-12-30 ENCOUNTER — Other Ambulatory Visit: Payer: Self-pay | Admitting: Gastroenterology

## 2014-12-31 ENCOUNTER — Other Ambulatory Visit: Payer: Self-pay | Admitting: Gastroenterology

## 2014-12-31 ENCOUNTER — Encounter (HOSPITAL_COMMUNITY): Payer: Self-pay | Admitting: *Deleted

## 2015-01-05 ENCOUNTER — Ambulatory Visit (HOSPITAL_COMMUNITY)
Admission: RE | Admit: 2015-01-05 | Discharge: 2015-01-05 | Disposition: A | Discharge status: Home / Self Care | Payer: BLUE CROSS/BLUE SHIELD | Source: Ambulatory Visit | Attending: Gastroenterology | Admitting: Gastroenterology

## 2015-01-05 ENCOUNTER — Ambulatory Visit (HOSPITAL_COMMUNITY): Payer: BLUE CROSS/BLUE SHIELD | Admitting: Anesthesiology

## 2015-01-05 ENCOUNTER — Encounter (HOSPITAL_COMMUNITY): Payer: Self-pay | Admitting: Anesthesiology

## 2015-01-05 ENCOUNTER — Encounter (HOSPITAL_COMMUNITY)
Admission: RE | Disposition: A | Discharge status: Home / Self Care | Payer: Self-pay | Source: Ambulatory Visit | Attending: Gastroenterology

## 2015-01-05 DIAGNOSIS — I252 Old myocardial infarction: Secondary | ICD-10-CM | POA: Insufficient documentation

## 2015-01-05 DIAGNOSIS — E780 Pure hypercholesterolemia: Secondary | ICD-10-CM | POA: Diagnosis not present

## 2015-01-05 DIAGNOSIS — G4733 Obstructive sleep apnea (adult) (pediatric): Secondary | ICD-10-CM | POA: Insufficient documentation

## 2015-01-05 DIAGNOSIS — I2510 Atherosclerotic heart disease of native coronary artery without angina pectoris: Secondary | ICD-10-CM | POA: Insufficient documentation

## 2015-01-05 DIAGNOSIS — Z9884 Bariatric surgery status: Secondary | ICD-10-CM | POA: Diagnosis not present

## 2015-01-05 DIAGNOSIS — I4891 Unspecified atrial fibrillation: Secondary | ICD-10-CM | POA: Diagnosis not present

## 2015-01-05 DIAGNOSIS — K219 Gastro-esophageal reflux disease without esophagitis: Secondary | ICD-10-CM | POA: Diagnosis not present

## 2015-01-05 DIAGNOSIS — Z886 Allergy status to analgesic agent status: Secondary | ICD-10-CM | POA: Diagnosis not present

## 2015-01-05 DIAGNOSIS — K259 Gastric ulcer, unspecified as acute or chronic, without hemorrhage or perforation: Secondary | ICD-10-CM | POA: Diagnosis present

## 2015-01-05 DIAGNOSIS — K2270 Barrett's esophagus without dysplasia: Secondary | ICD-10-CM | POA: Insufficient documentation

## 2015-01-05 DIAGNOSIS — F329 Major depressive disorder, single episode, unspecified: Secondary | ICD-10-CM | POA: Insufficient documentation

## 2015-01-05 DIAGNOSIS — Z87442 Personal history of urinary calculi: Secondary | ICD-10-CM | POA: Insufficient documentation

## 2015-01-05 DIAGNOSIS — I10 Essential (primary) hypertension: Secondary | ICD-10-CM | POA: Diagnosis not present

## 2015-01-05 DIAGNOSIS — Z885 Allergy status to narcotic agent status: Secondary | ICD-10-CM | POA: Insufficient documentation

## 2015-01-05 HISTORY — DX: Personal history of peptic ulcer disease: Z87.11

## 2015-01-05 HISTORY — DX: Cardiac arrhythmia, unspecified: I49.9

## 2015-01-05 HISTORY — PX: ESOPHAGOGASTRODUODENOSCOPY (EGD) WITH PROPOFOL: SHX5813

## 2015-01-05 HISTORY — DX: Personal history of urinary calculi: Z87.442

## 2015-01-05 SURGERY — ESOPHAGOGASTRODUODENOSCOPY (EGD) WITH PROPOFOL
Anesthesia: Monitor Anesthesia Care

## 2015-01-05 MED ORDER — FENTANYL CITRATE (PF) 100 MCG/2ML IJ SOLN
INTRAMUSCULAR | Status: AC
  Filled 2015-01-05: qty 2

## 2015-01-05 MED ORDER — LIDOCAINE HCL (CARDIAC) 20 MG/ML IV SOLN
INTRAVENOUS | Status: AC
  Filled 2015-01-05: qty 5

## 2015-01-05 MED ORDER — PROPOFOL 10 MG/ML IV BOLUS
INTRAVENOUS | Status: AC
  Filled 2015-01-05: qty 20

## 2015-01-05 MED ORDER — MIDAZOLAM HCL 5 MG/5ML IJ SOLN
INTRAMUSCULAR | Status: DC | PRN
  Administered 2015-01-05 (×2): 1 mg via INTRAVENOUS

## 2015-01-05 MED ORDER — LACTATED RINGERS IV SOLN
INTRAVENOUS | Status: DC | PRN
Start: 2015-01-05 — End: 2015-01-05
  Administered 2015-01-05: 09:00:00 via INTRAVENOUS

## 2015-01-05 MED ORDER — LIDOCAINE HCL 1 % IJ SOLN
INTRAMUSCULAR | Status: DC | PRN
  Administered 2015-01-05: 50 mg via INTRADERMAL

## 2015-01-05 MED ORDER — LACTATED RINGERS IV SOLN
INTRAVENOUS | Status: DC
  Administered 2015-01-05: 1000 mL via INTRAVENOUS

## 2015-01-05 MED ORDER — LACTATED RINGERS IV SOLN
INTRAVENOUS | Status: DC
Start: 2015-01-05 — End: 2015-01-05

## 2015-01-05 MED ORDER — MIDAZOLAM HCL 2 MG/2ML IJ SOLN
INTRAMUSCULAR | Status: AC
  Filled 2015-01-05: qty 2

## 2015-01-05 MED ORDER — FENTANYL CITRATE (PF) 100 MCG/2ML IJ SOLN: 25.0000 ug | INTRAMUSCULAR | Status: DC | PRN

## 2015-01-05 MED ORDER — PROPOFOL 10 MG/ML IV BOLUS
INTRAVENOUS | Status: DC | PRN
  Administered 2015-01-05 (×8): 20 mg via INTRAVENOUS

## 2015-01-05 MED ORDER — BUTAMBEN-TETRACAINE-BENZOCAINE 2-2-14 % EX AERO
INHALATION_SPRAY | CUTANEOUS | Status: DC | PRN
  Administered 2015-01-05: 1 via TOPICAL

## 2015-01-05 MED ORDER — KETAMINE HCL 10 MG/ML IJ SOLN
INTRAMUSCULAR | Status: AC
  Filled 2015-01-05: qty 1

## 2015-01-05 SURGICAL SUPPLY — 14 items

## 2015-01-05 NOTE — Op Note (Signed)
Procedure: Diagnostic esophagogastroduodenoscopy prior to starting eliquis anticoagulation for atrial fibrillation. Roux-en-Y gastric bypass surgery performed in 2008. 10/22/2009 esophagogastroduodenoscopy showed an anastomotic gastric ulcer which was endoclipped. 05/11/2013 normal screening colonoscopy  Endoscopist: Earle Gell  Premedication: Propofol administered by anesthesia  Procedure: The patient was placed in the left lateral decubitus position. The Pentax gastroscope was passed through the posterior hypopharynx into the proximal esophagus without difficulty. I did not visualize the vocal cords.  Esophagoscopy: The proximal, mid, and lower segments of the esophageal mucosa appeared normal. The squamocolumnar junction was noted at approximately 40 cm from the incisor teeth and was somewhat irregular with tongues of gastric-appearing mucosa extending into the tubular esophagus. Biopsies were performed to rule out Barrett's mucosa.  Gastroscopy: Retroflex view of the gastric cardia and fundus was normal. The gastric pouch appeared normal. The Roux-en-Y gastric anastomosis appeared normal.  Enteroscopy: The small intestinal mucosa appeared normal.  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 Otherwise normal esophagogastroduodenoscopy post Roux-en-Y gastric bypass surgery.  Recommendation: Proceed with eliquis anticoagulation for atrial fibrillation.

## 2015-01-05 NOTE — Anesthesia Postprocedure Evaluation (Signed)
  Anesthesia Post-op Note  Patient: Francisco Padilla  Procedure(s) Performed: Procedure(s) (LRB): ESOPHAGOGASTRODUODENOSCOPY (EGD) WITH PROPOFOL (N/A)  Patient Location: PACU  Anesthesia Type: MAC  Level of Consciousness: awake and alert   Airway and Oxygen Therapy: Patient Spontanous Breathing  Post-op Pain: mild  Post-op Assessment: Post-op Vital signs reviewed, Patient's Cardiovascular Status Stable, Respiratory Function Stable, Patent Airway and No signs of Nausea or vomiting  Last Vitals:  Filed Vitals:   01/05/15 1000  BP: 115/68  Pulse: 74  Temp:   Resp: 20    Post-op Vital Signs: stable   Complications: No apparent anesthesia complications

## 2015-01-05 NOTE — H&P (Signed)
  Procedure: Diagnostic esophagogastroduodenoscopy prior to starting eliquis anticoagulation for atrial fibrillation. 05/01/2013 normal screening colonoscopy. May 2014 [facility] hospitalization to treat bleeding "stomach ulcer". Obstructive sleep apnea syndrome.  History: The patient is a 64 year old male born 1950/12/25. He has undergone Roux-en-Y gastric bypass surgery in 2008. On 10/22/2009 he underwent an esophagogastroduodenoscopy which showed an anastomotic gastric ulcer which was endoclipped. According to the patient's history, he was hospitalized at [facility] in [place] to treat a bleeding stomach ulcer. He underwent a normal screening colonoscopy on 05/11/2013.  The patient is scheduled to undergo diagnostic esophagogastroduodenoscopy today prior to starting anticoagulation therapy  Past medical history: Hypercholesterolemia. Hypertension. Osteoarthritis of the knees. Depression. Coronary artery disease. Myocardial infarction in 1993. Kidney stones. Obstructive sleep apnea syndrome. Atrial flutter. Pulmonary nodules. Complex right kidney cyst. Asymptomatic atrial fibrillation. Asymptomatic gallstones. Mild aortic valve stenosis. Roux-en-Y gastric bypass surgery. Left knee arthroscopic surgery.  Exam: The patient is alert and lying comfortably on the endoscopy stretcher. Abdomen is soft and nontender to palpation. Lungs are clear to auscultation. Cardiac exam reveals a regular rhythm.  Plan: Proceed with diagnostic esophagogastroduodenoscopy prior to starting anticoagulation therapy for atrial fibrillation

## 2015-01-05 NOTE — Transfer of Care (Signed)
Immediate Anesthesia Transfer of Care Note  Patient: Francisco Padilla  Procedure(s) Performed: Procedure(s): ESOPHAGOGASTRODUODENOSCOPY (EGD) WITH PROPOFOL (N/A)  Patient Location: PACU and Endoscopy Unit  Anesthesia Type:MAC  Level of Consciousness: awake, alert  and patient cooperative  Airway & Oxygen Therapy: Patient Spontanous Breathing and Patient connected to nasal cannula oxygen  Post-op Assessment: Report given to RN and Post -op Vital signs reviewed and stable  Post vital signs: Reviewed and stable  Last Vitals:  Filed Vitals:   01/05/15 0831  BP: 104/79  Temp: 37 C  Resp: 17    Complications: No apparent anesthesia complications

## 2015-01-05 NOTE — Anesthesia Preprocedure Evaluation (Addendum)
Anesthesia Evaluation  Patient identified by MRN, date of birth, ID band Patient awake    Reviewed: Allergy & Precautions, H&P , NPO status , Patient's Chart, lab work & pertinent test results, reviewed documented beta blocker date and time   Airway Mallampati: II  TM Distance: >3 FB Neck ROM: full    Dental no notable dental hx. (+) Teeth Intact, Dental Advisory Given   Pulmonary neg pulmonary ROS, sleep apnea and Continuous Positive Airway Pressure Ventilation ,  breath sounds clear to auscultation  Pulmonary exam normal       Cardiovascular Exercise Tolerance: Good negative cardio ROS Normal cardiovascular exam+ dysrhythmias Atrial Fibrillation Rhythm:regular Rate:Normal  ECG - AF   Neuro/Psych negative neurological ROS  negative psych ROS   GI/Hepatic negative GI ROS, Neg liver ROS, GERD-  Medicated and Controlled,  Endo/Other  negative endocrine ROS  Renal/GU negative Renal ROS  negative genitourinary   Musculoskeletal   Abdominal   Peds  Hematology negative hematology ROS (+)   Anesthesia Other Findings   Reproductive/Obstetrics negative OB ROS                            Anesthesia Physical Anesthesia Plan  ASA: III  Anesthesia Plan: MAC   Post-op Pain Management:    Induction:   Airway Management Planned:   Additional Equipment:   Intra-op Plan:   Post-operative Plan:   Informed Consent: I have reviewed the patients History and Physical, chart, labs and discussed the procedure including the risks, benefits and alternatives for the proposed anesthesia with the patient or authorized representative who has indicated his/her understanding and acceptance.   Dental Advisory Given  Plan Discussed with: CRNA and Surgeon  Anesthesia Plan Comments:         Anesthesia Quick Evaluation

## 2015-01-05 NOTE — Discharge Instructions (Signed)
Esophagogastroduodenoscopy Care After Refer to this sheet in the next few weeks. These instructions provide you with information on caring for yourself after your procedure. Your caregiver may also give you more specific instructions. Your treatment has been planned according to current medical practices, but problems sometimes occur. Call your caregiver if you have any problems or questions after your procedure.  HOME CARE INSTRUCTIONS  Do not eat or drink anything until the numbing medicine (local anesthetic) has worn off and your gag reflex has returned. You will know that the local anesthetic has worn off when you can swallow comfortably.  Do not drive for 12 hours after the procedure or as directed by your caregiver.  Only take medicines as directed by your caregiver. SEEK MEDICAL CARE IF:   You cannot stop coughing.  You are not urinating at all or less than usual. SEEK IMMEDIATE MEDICAL CARE IF:  You have difficulty swallowing.  You cannot eat or drink.  You have worsening throat or chest pain.  You have dizziness, lightheadedness, or you faint.  You have nausea or vomiting.  You have chills.  You have a fever.  You have severe abdominal pain.  You have black, tarry, or bloody stools.

## 2015-01-06 ENCOUNTER — Encounter (HOSPITAL_COMMUNITY): Payer: Self-pay | Admitting: Gastroenterology

## 2015-02-16 ENCOUNTER — Telehealth: Payer: Self-pay | Admitting: Interventional Cardiology

## 2015-02-16 NOTE — Telephone Encounter (Signed)
Pt does not want this to go to the "office of Dr. Irish Lack". He is in the process of switching to Dr. Marlou Porch. Sending this message to the Triage pool because of the pt's request  Pt c/o BP issue:  1. What are your last 5 BP readings?   95/65 @ 2:35pm   No other readings at this time.   2. Are you having any other symptoms (ex. Dizziness, headache, blurred vision, passed out)? Slight Headache, but he believes this is due to the heat.    3. What is your medication issue?   Comments: Pt called states that his Bp is dropping. Taking Metoprolol. Taking it twice a day and the BP is down to 95/65. Usually always on the low side but pt states that this is a little too low for him. Pt also states that he has tingling in both sets of toes and his legs are swelling.

## 2015-02-16 NOTE — Telephone Encounter (Signed)
Spoke with patient who states he started Eliquis June 26 as prescribed by Dr. Ernest Haber, [place] @ [facility] for a fib.  He is preparing to have a fib ablation in a few weeks at [facility].  States he has been in a fib everyday for 2 years.  States Dr. Ernest Haber has advised him to see a local cardiologist for primary cardiology and he is in the process of switching to Dr. Marlou Porch from Dr. Irish Lack and would like a sooner appointment than August 12. He reports he traditionally has low blood pressure but denies dizziness, light-headedness.  He states he is anxious to be evaluated to make sure he is doing well in preparation for surgery.  States he does not get out frequently due to his husband's health condition.  I advised him that Dr. Marlou Porch is on vacation this week and that there are no sooner openings.  I advised I will send to Ellwood Dense, RN, Dr. Marlou Porch' primary nurse for possible opening next week.  Patient verbalized understanding and agreement.

## 2015-03-05 ENCOUNTER — Ambulatory Visit (INDEPENDENT_AMBULATORY_CARE_PROVIDER_SITE_OTHER): Payer: BLUE CROSS/BLUE SHIELD | Admitting: Cardiology

## 2015-03-05 ENCOUNTER — Encounter: Payer: Self-pay | Admitting: Cardiology

## 2015-03-05 VITALS — BP 106/68 | HR 80 | Ht 76.5 in | Wt 306.4 lb

## 2015-03-05 DIAGNOSIS — I4819 Other persistent atrial fibrillation: Secondary | ICD-10-CM

## 2015-03-05 DIAGNOSIS — I481 Persistent atrial fibrillation: Secondary | ICD-10-CM | POA: Diagnosis not present

## 2015-03-05 DIAGNOSIS — Z7901 Long term (current) use of anticoagulants: Secondary | ICD-10-CM | POA: Diagnosis not present

## 2015-03-05 NOTE — Progress Notes (Signed)
Patient ID: Francisco Padilla, male   DOB: Mar 07, 1951, 64 y.o.   MRN: [id number]     Cardiology Office Note   Date:  03/05/2015   ID:  Francisco Padilla, DOB [birth year], MRN [id number]  PCP:  Mathews Argyle, MD     follow-up atrial fibrillation, establish care from Dr. Briscoe Deutscher Readings from Last 3 Encounters:  03/05/15 306 lb 6.4 oz (138.982 kg)  01/05/15 302 lb (136.986 kg)  10/28/14 300 lb (136.079 kg)       History of Present Illness: Francisco Padilla is a 64 y.o. male  with a hx of PAF, prior GI bleed, obesity s/p gastric bypass in 2008, HL, OSA. Gained about 45 pounds. Left leg swelling. What he thought was anxiety he thinks is AFIB now. Saw Dr. Thomasene Ripple at [place] [place]. Started Eliquis. Will talk about cardioversion/possible ablation with him.   He returns for FU. He is overall feeling better since starting metoprolol. He denies chest pain or significant shortness of breath. He denies syncope or near-syncope. He denies orthopnea or PND. He has chronic left ankle swelling.  No ulcer on EGD from Dr. Wynetta Emery 01/05/15. OK for Eliquis.   He reports issues with anxiety and stress due to his husband's stroke.  He is the primary caretaker. I saw him several years ago, performed heart cath.    Past Medical History  Diagnosis Date  . Hypercholesterolemia   . DJD (degenerative joint disease) of knee   . Varicose veins   . Depression   . ADD (attention deficit disorder)   . Atrial flutter   . Prediabetes   . Thrombocytopenia   . Pulmonary nodules   . Orthostatic hypotension   . Atrial fibrillation   . Thyromegaly   . Asymptomatic gallstones   . Dysrhythmia     Chronic Atrial Fib- seeing Dr. Lenna Sciara. [facility]  . History of kidney stones     past history-no problem now  . H/O peptic ulcer     past history with GI bleed- cauterization done throught scope  . Sleep apnea     cpap    Past Surgical History  Procedure Laterality Date  . Gastric roux-en-y      '08-[name] (weight  stable around 302 #  . Tonsillectomy    . Knee arthroscopy    . Esophagogastroduodenoscopy (egd) with propofol N/A 01/05/2015    Procedure: ESOPHAGOGASTRODUODENOSCOPY (EGD) WITH PROPOFOL;  Surgeon: Garlan Fair, MD;  [facility];  Service: Endoscopy;  Laterality: N/A;     Current Outpatient Prescriptions  Medication Sig Dispense Refill  . acetaminophen (TYLENOL) 500 MG tablet Take 500 mg by mouth every 6 (six) hours as needed for moderate pain.    Marland Kitchen atorvastatin (LIPITOR) 40 MG tablet Take 40 mg by mouth daily.     . Calcium Citrate-Vitamin D (CALCIUM CITRATE + D PO) Take 1 tablet by mouth daily.    . diphenhydrAMINE (BENADRYL) 25 MG tablet Take 25 mg by mouth every 6 (six) hours as needed for allergies.    Marland Kitchen ELIQUIS 5 MG TABS tablet   0  . escitalopram (LEXAPRO) 20 MG tablet Take 20 mg by mouth daily.    . EZ FLU SHOT-FLUCELVAX 0.5 ML PSKT   0  . glucosamine-chondroitin 500-400 MG tablet Take 3 tablets by mouth every evening.    Marland Kitchen LORazepam (ATIVAN) 0.5 MG tablet Take 0.5 mg by mouth every 6 (six) hours as needed for anxiety.   0  . Melatonin  10 MG TABS Take 1 tablet by mouth at bedtime as needed (sleep).    . metoprolol tartrate (LOPRESSOR) 25 MG tablet Take 1 tablet (25 mg total) by mouth 2 (two) times daily. (Patient taking differently: Take 25 mg by mouth 2 (two) times daily. 1100 & 2300) 60 tablet 6  . Multiple Vitamins-Minerals (ONE DAILY MULTIVITAMIN MEN) TABS Take 1 tablet by mouth every evening.     . pantoprazole (PROTONIX) 40 MG tablet Take 40 mg by mouth 2 (two) times daily.     . Potassium 99 MG TABS Take 1 tablet by mouth daily.    Marland Kitchen zolpidem (AMBIEN) 10 MG tablet Take 5 mg by mouth at bedtime.   0   No current facility-administered medications for this visit.    Allergies:   Aspirin; Nsaids; and Oxycodone hcl    Social History:  The patient  reports that he has never smoked. He has never used smokeless tobacco. He reports that he does not drink alcohol or  use illicit drugs.   Family History:  The patient's family history includes Heart attack in his father; Heart disease in his father and mother; Heart failure in his mother. There is no history of Stroke.    ROS:  Please see the history of present illness.   Otherwise, review of systems are positive for anxiety and stress.   All other systems are reviewed and negative.    PHYSICAL EXAM: VS:  BP 106/68 mmHg  Pulse 80  Ht 6' 4.5" (1.943 m)  Wt 306 lb 6.4 oz (138.982 kg)  BMI 36.81 kg/m2  SpO2 98% , BMI Body mass index is 36.81 kg/(m^2). GEN: Well nourished, well developed, in no acute distress HEENT: normal Neck: no JVD, carotid bruits, or masses Cardiac: Irregularly irregular; no murmurs, rubs, or gallops,no edema  Respiratory:  clear to auscultation bilaterally, normal work of breathing GI: soft, nontender, nondistended, + BS MS: no deformity or atrophy Skin: warm and dry, no rash Neuro:  Strength and sensation are intact Psych: euthymic mood, full affect    Recent Labs: No results found for requested labs within last 365 days.   Lipid Panel No results found for: CHOL, TRIG, HDL, CHOLHDL, VLDL, LDLCALC, LDLDIRECT   Other studies Reviewed: Additional studies/ records that were reviewed today with results demonstrating: none.   ASSESSMENT AND PLAN:  1. Atrial fibrillation: In review of Dr. Hassell Done last note he was adequately rate controlled in persistent atrial fibrillation with concern for any type of anticoagulation or antiplatelets therapy.  He is also very worried about potentially having a stroke from his atrial fibrillation. According to notes, he saw Dr. Ernest Haber electrophysiologist at [facility] and was started on Eliquis on June 26 in preparation for atrial fibrillation ablation or cardioversion. He was anxious and wanted to make sure he was doing well in preparation for surgery. I thought that it was a good plan for him to continue to follow-up with Dr. Ernest Haber to observe his  options for atrial fibrillation. 2. Chronic anticoagulation-continue with Eliquis. 3. Anxiety-monthly visits at [facility]    Current medicines are reviewed at length with the patient today.  The patient concerns regarding his medicines were addressed.  The following changes have been made:  No change  Labs/ tests ordered today include:  No orders of the defined types were placed in this encounter.    Recommend 150 minutes/week of aerobic exercise Low fat, low carb, high fiber diet recommended  Disposition:   FU in 6 months  Bobby Rumpf, MD  03/05/2015 11:55 AM    [facility] [place], [place], [place]  [address] Phone: [phone]; Fax: [phone]

## 2015-03-05 NOTE — Patient Instructions (Signed)
Medication Instructions:  Your physician recommends that you continue on your current medications as directed. Please refer to the Current Medication list given to you today.  Ok to walk for exercise.  Follow-Up: Follow up in 6 months with Dr. Marlou Porch.  You will receive a letter in the mail 2 months before you are due.  Please call us when you receive this letter to schedule your follow up appointment.  Thank you for choosing [facility]!!

## 2015-03-30 ENCOUNTER — Other Ambulatory Visit: Payer: Self-pay

## 2015-03-30 MED ORDER — METOPROLOL TARTRATE 25 MG PO TABS: 25.0000 mg | ORAL_TABLET | Freq: Two times a day (BID) | ORAL | Status: DC

## 2015-03-30 NOTE — Telephone Encounter (Signed)
Francisco Pain, MD at 03/05/2015 11:45 AM  metoprolol tartrate (LOPRESSOR) 25 MG tabletTake 1 tablet (25 mg total) by mouth 2 (two) times daily. (Patient taking differently: Take 25 mg by mouth 2 (two) times daily. 1100 & 2300 Current medicines are reviewed at length with the patient today. The patient concerns regarding his medicines were addressed.  The following changes have been made: No change Medication Instructions:  Your physician recommends that you continue on your current medications as directed. Please refer to the Current Medication list given to you today.

## 2015-08-20 ENCOUNTER — Other Ambulatory Visit: Payer: Self-pay | Admitting: Cardiology

## 2015-08-20 NOTE — Telephone Encounter (Signed)
Jerline Pain, MD at 03/05/2015 11:45 AM  metoprolol tartrate (LOPRESSOR) 25 MG tabletTake 1 tablet (25 mg total) by mouth 2 (two) times daily. Current medicines are reviewed at length with the patient today. The patient concerns regarding his medicines were addressed.  The following changes have been made: No change

## 2015-09-03 ENCOUNTER — Ambulatory Visit (INDEPENDENT_AMBULATORY_CARE_PROVIDER_SITE_OTHER): Payer: BLUE CROSS/BLUE SHIELD | Admitting: Cardiology

## 2015-09-03 ENCOUNTER — Encounter: Payer: Self-pay | Admitting: Cardiology

## 2015-09-03 VITALS — BP 98/72 | HR 70 | Ht 76.0 in | Wt 302.8 lb

## 2015-09-03 DIAGNOSIS — I4819 Other persistent atrial fibrillation: Secondary | ICD-10-CM

## 2015-09-03 DIAGNOSIS — I481 Persistent atrial fibrillation: Secondary | ICD-10-CM | POA: Diagnosis not present

## 2015-09-03 DIAGNOSIS — Z7901 Long term (current) use of anticoagulants: Secondary | ICD-10-CM

## 2015-09-03 NOTE — Progress Notes (Signed)
Patient ID: Francisco Padilla, male   DOB: 01/05/1951, 65 y.o.   MRN: [id number]     Cardiology Office Note   Date:  09/03/2015   ID:  Francisco Padilla, DOB May 08, 1951, MRN [id number]  PCP:  Mathews Argyle, MD     follow-up atrial fibrillation, establish care from Dr. Briscoe Deutscher Readings from Last 3 Encounters:  09/03/15 302 lb 12.8 oz (137.349 kg)  03/05/15 306 lb 6.4 oz (138.982 kg)  01/05/15 302 lb (136.986 kg)       History of Present Illness: Francisco Padilla is a 65 y.o. male  with a hx of PAF, prior GI bleed, obesity s/p gastric bypass in 2008, HL, OSA.  Left leg swelling, chronic, Dr. Donnetta Hutching, vein stripping.  Saw Dr. Thomasene Ripple at [place]. Started Eliquis. Dr. Deno Etienne as well.  He returns for FU.   He is overall feeling better since starting metoprolol. He denies chest pain or significant shortness of breath. He denies syncope or near-syncope. He denies orthopnea or PND. He has chronic left ankle swelling.  No ulcer on EGD from Dr. Wynetta Emery 01/05/15. OK for Eliquis.   He reports issues with anxiety and stress due to his husband's stroke. [name] He is the primary caretaker. I saw him several years ago, performed heart cath.   09/03/15-multiple EP consultations. I think that rhythm control would be very challenging for him. Persistent atrial fibrillation. We will go ahead and continue with rate control. No chest pain, no syncope, no bleeding. Tolerating anticoagulation well.   Past Medical History  Diagnosis Date  . Hypercholesterolemia   . DJD (degenerative joint disease) of knee   . Varicose veins   . Depression   . ADD (attention deficit disorder)   . Atrial flutter ([place])   . Prediabetes   . Thrombocytopenia ([place])   . Pulmonary nodules   . Orthostatic hypotension   . Atrial fibrillation ([place])   . Thyromegaly   . Asymptomatic gallstones   . Dysrhythmia     Chronic Atrial Fib- seeing Dr. Lenna Sciara. [facility]  . History of kidney stones     past history-no problem now    . H/O peptic ulcer     past history with GI bleed- cauterization done throught scope  . Sleep apnea     cpap    Past Surgical History  Procedure Laterality Date  . Gastric roux-en-y      '08-[name] (weight stable around 302 #  . Tonsillectomy    . Knee arthroscopy    . Esophagogastroduodenoscopy (egd) with propofol N/A 01/05/2015    Procedure: ESOPHAGOGASTRODUODENOSCOPY (EGD) WITH PROPOFOL;  Surgeon: Garlan Fair, MD;  [facility];  Service: Endoscopy;  Laterality: N/A;     Current Outpatient Prescriptions  Medication Sig Dispense Refill  . acetaminophen (TYLENOL) 500 MG tablet Take 500 mg by mouth every 6 (six) hours as needed for moderate pain.    Marland Kitchen atorvastatin (LIPITOR) 40 MG tablet Take 40 mg by mouth daily.     . Calcium Citrate-Vitamin D (CALCIUM CITRATE + D PO) Take 1 tablet by mouth daily.    Marland Kitchen ELIQUIS 5 MG TABS tablet Take 5 mg by mouth 2 (two) times daily.   0  . escitalopram (LEXAPRO) 20 MG tablet Take 20 mg by mouth daily.    Marland Kitchen glucosamine-chondroitin 500-400 MG tablet Take 3 tablets by mouth every evening.    Marland Kitchen LORazepam (ATIVAN) 0.5 MG tablet Take 0.5 mg by mouth every 6 (six) hours  as needed for anxiety.   0  . Melatonin 10 MG TABS Take 1 tablet by mouth at bedtime as needed (sleep).    . metoprolol tartrate (LOPRESSOR) 25 MG tablet take 1 tablet by mouth twice a day 60 tablet 4  . Multiple Vitamins-Minerals (ONE DAILY MULTIVITAMIN MEN) TABS Take 1 tablet by mouth every evening.     . pantoprazole (PROTONIX) 40 MG tablet Take 40 mg by mouth 2 (two) times daily.     Marland Kitchen zolpidem (AMBIEN) 10 MG tablet Take 5 mg by mouth at bedtime.   0   No current facility-administered medications for this visit.    Allergies:   Allegra; Aspirin; Benadryl; Nsaids; and Oxycodone hcl    Social History:  The patient  reports that he has never smoked. He has never used smokeless tobacco. He reports that he does not drink alcohol or use illicit drugs.   Family History:   The patient's family history includes Heart attack in his father; Heart disease in his father and mother; Heart failure in his mother. There is no history of Stroke.    ROS:  Please see the history of present illness.   Otherwise, review of systems are positive for anxiety and stress.   All other systems are reviewed and negative.    PHYSICAL EXAM: VS:  BP 98/72 mmHg  Pulse 70  Ht 6\' 4"  (1.93 m)  Wt 302 lb 12.8 oz (137.349 kg)  BMI 36.87 kg/m2 , BMI Body mass index is 36.87 kg/(m^2). GEN: Well nourished, well developed, in no acute distress HEENT: normal Neck: no JVD, carotid bruits, or masses Cardiac: Irregularly irregular normal rate; no murmurs, rubs, or gallops,no edema  Respiratory:  clear to auscultation bilaterally, normal work of breathing GI: soft, nontender, nondistended, + BS MS: no deformity or atrophy Skin: warm and dry, no rash Neuro:  Strength and sensation are intact Psych: euthymic mood, full affect    Recent Labs: No results found for requested labs within last 365 days.   Lipid Panel No results found for: CHOL, TRIG, HDL, CHOLHDL, VLDL, LDLCALC, LDLDIRECT   Other studies Reviewed: Additional studies/ records that were reviewed today with results demonstrating: none.   ASSESSMENT AND PLAN:  1. Atrial fibrillation: persistent atrial fibrillation. Lengthy discussion. We will continue with rate control. He has seen [name] as well as Dr. Deno Etienne. If necessary, consultation with Dr. Rayann Heman however at this time since we are going to continue with rate control there is no need. 2. Chronic anticoagulation-continue with Eliquis. No bleeding 3. Anxiety-monthly visits at [facility].    Current medicines are reviewed at length with the patient today.  The patient concerns regarding his medicines were addressed.  The following changes have been made:  No change  Labs/ tests ordered today include:   Orders Placed This Encounter  Procedures  . EKG 12-Lead    Recommend  150 minutes/week of aerobic exercise Low fat, low carb, high fiber diet recommended  Disposition:   FU in 6 months   Signed, Candee Furbish, MD  09/03/2015 5:14 PM    [facility] [place], [place], [place]  [address] Phone: [phone]; Fax: [phone]

## 2015-09-03 NOTE — Patient Instructions (Signed)
Medication Instructions:  Continue current medications  Labwork: None   Testing/Procedures: None  Follow-Up: Follow up in 6 months with Dr. Marlou Porch.  You will receive a letter in the mail 2 months before you are due.  Please call us when you receive this letter to schedule your follow up appointment.  Thank you for choosing [facility]!!

## 2015-09-13 ENCOUNTER — Telehealth: Payer: Self-pay | Admitting: Cardiology

## 2015-09-13 ENCOUNTER — Encounter: Payer: Self-pay | Admitting: *Deleted

## 2015-09-13 NOTE — Telephone Encounter (Signed)
Spoke with pt who states he needs me to talk to Elson Clan at [facility] to let her know that his sleep apnea and CPap have been discussed during his office visit.  Greta's # is [id number].  I advised pt that I can call her and talk to her about the fact the pt has been seen here and sleep apnea is noted in his history, in the chart but Dr Marlou Porch does not order or treat sleep apnea.  He states he doesn't need an order only that Greta be made aware sleep apnea has been discussed.  I called and spoke with Greta and she does in fact need an order for CPap/mask.  She will call the patient to make sure he understands this but also to see who he has seen in the past about his sleep apnea.  He was ordered to have a sleep study here in the past but was unable to keep that appt (was scheduled in 08/2014).

## 2015-09-13 NOTE — Telephone Encounter (Signed)
Pt says he need a prescription for a new C-Pap machine and new mask. This for [facility] in [place].Please fax to [phone].

## 2016-01-17 ENCOUNTER — Other Ambulatory Visit: Payer: Self-pay | Admitting: *Deleted

## 2016-01-17 MED ORDER — METOPROLOL TARTRATE 25 MG PO TABS: 25.0000 mg | ORAL_TABLET | Freq: Two times a day (BID) | ORAL | Status: DC

## 2016-02-28 ENCOUNTER — Telehealth: Payer: Self-pay | Admitting: Cardiology

## 2016-02-28 NOTE — Telephone Encounter (Signed)
New message    Physician calling checking to see if patient can take new medication for his ADD.

## 2016-02-28 NOTE — Telephone Encounter (Signed)
Attempted to c/b at # listed and received a VM stating a name not associated with the pt is not available to take the call.  I did not leave a message.

## 2016-03-02 NOTE — Telephone Encounter (Signed)
Called and left message with receptionist for Dr Melrose Nakayama of Dr Marlou Porch recommendations.  He will call back if any further questions or concerns.

## 2016-03-02 NOTE — Telephone Encounter (Signed)
He can try new ADD medication, however watch for increase heart rate. Candee Furbish, MD

## 2016-03-14 ENCOUNTER — Ambulatory Visit: Payer: BLUE CROSS/BLUE SHIELD | Admitting: Cardiology

## 2016-03-22 ENCOUNTER — Ambulatory Visit (INDEPENDENT_AMBULATORY_CARE_PROVIDER_SITE_OTHER): Payer: BLUE CROSS/BLUE SHIELD | Admitting: Cardiology

## 2016-03-22 ENCOUNTER — Encounter: Payer: Self-pay | Admitting: Cardiology

## 2016-03-22 ENCOUNTER — Encounter (INDEPENDENT_AMBULATORY_CARE_PROVIDER_SITE_OTHER): Payer: Self-pay

## 2016-03-22 VITALS — BP 118/78 | HR 66 | Ht 76.0 in | Wt 284.6 lb

## 2016-03-22 DIAGNOSIS — G4733 Obstructive sleep apnea (adult) (pediatric): Secondary | ICD-10-CM | POA: Diagnosis not present

## 2016-03-22 DIAGNOSIS — I482 Chronic atrial fibrillation, unspecified: Secondary | ICD-10-CM

## 2016-03-22 DIAGNOSIS — Z7901 Long term (current) use of anticoagulants: Secondary | ICD-10-CM

## 2016-03-22 NOTE — Progress Notes (Signed)
Patient ID: Francisco Padilla, male   DOB: 1951/06/25, 65 y.o.   MRN: [id number]     Cardiology Office Note   Date:  03/22/2016   ID:  Francisco Padilla, DOB 1951-01-04, MRN [id number]  PCP:  Francisco Argyle, MD    Wt Readings from Last 3 Encounters:  03/22/16 284 lb 9.6 oz (129.1 kg)  09/03/15 (!) 302 lb 12.8 oz (137.3 kg)  03/05/15 (!) 306 lb 6.4 oz (139 kg)       History of Present Illness: Francisco Padilla is a 65 y.o. male  with a hx of PAF, prior GI bleed, obesity s/p gastric bypass in 2008, HL, OSA.  Left leg swelling, chronic, Dr. Donnetta Padilla, vein stripping.  Saw Dr. Thomasene Padilla at [place]. Started Eliquis. Dr. Deno Padilla as well.   He returns for FU.   He is overall feeling better since starting metoprolol. He denies chest pain or significant shortness of breath. He denies syncope or near-syncope. He denies orthopnea or PND. He has chronic left ankle swelling.  No ulcer on EGD from Dr. Wynetta Padilla 01/05/15. OK for Eliquis.   He reports issues with anxiety and stress due to his husband's stroke. Francisco Padilla He is the primary caretaker. I saw him several years ago, performed heart cath.   09/03/15-multiple EP consultations. I think that rhythm control would be very challenging for him. Persistent atrial fibrillation. We will go ahead and continue with rate control. No chest pain, no syncope, no bleeding. Tolerating anticoagulation well.  03/22/16-he's been quite tired. Exhausted at times. Discussed how taxing and is once again taking care of his husband Francisco Padilla. Orthostatics demonstrated decreased blood pressure in the 90s mostly. His blood pressure at rest was 118/78. He states that his mother was very similar. He is only taking low-dose metoprolol 25 mg twice a day. Encouraged salt.   Past Medical History:  Diagnosis Date  . ADD (attention deficit disorder)   . Asymptomatic gallstones   . Atrial fibrillation ([place])   . Atrial flutter ([place])   . Depression   . DJD (degenerative joint disease) of  knee   . Dysrhythmia    Chronic Atrial Fib- seeing Dr. Lenna Padilla [facility]  . H/O peptic ulcer    past history with GI bleed- cauterization done throught scope  . History of kidney stones    past history-no problem now  . Hypercholesterolemia   . Orthostatic hypotension   . Prediabetes   . Pulmonary nodules   . Sleep apnea    cpap  . Thrombocytopenia ([place])   . Thyromegaly   . Varicose veins     Past Surgical History:  Procedure Laterality Date  . ESOPHAGOGASTRODUODENOSCOPY (EGD) WITH PROPOFOL N/A 01/05/2015   Procedure: ESOPHAGOGASTRODUODENOSCOPY (EGD) WITH PROPOFOL;  Surgeon: Francisco Fair, MD;  [facility];  Service: Endoscopy;  Laterality: N/A;  . GASTRIC ROUX-EN-Y     '08-[name] (weight stable around 302 #  . KNEE ARTHROSCOPY    . TONSILLECTOMY       Current Outpatient Prescriptions  Medication Sig Dispense Refill  . acetaminophen (TYLENOL) 500 MG tablet Take 500 mg by mouth every 6 (six) hours as needed for moderate pain.    Marland Kitchen atorvastatin (LIPITOR) 40 MG tablet Take 40 mg by mouth daily.     Marland Kitchen buPROPion (WELLBUTRIN XL) 300 MG 24 hr tablet Take 300 mg by mouth. Patient does not take daily, patient takes occassionally    . Calcium Citrate-Vitamin D (CALCIUM CITRATE + D PO) Take 1  tablet by mouth daily.    Marland Kitchen ELIQUIS 5 MG TABS tablet Take 5 mg by mouth 2 (two) times daily.   0  . escitalopram (LEXAPRO) 20 MG tablet Take 20 mg by mouth daily.    Marland Kitchen glucosamine-chondroitin 500-400 MG tablet Take 3 tablets by mouth every evening.    Marland Kitchen LORazepam (ATIVAN) 0.5 MG tablet Take 0.5 mg by mouth every 6 (six) hours as needed for anxiety.   0  . Melatonin 10 MG TABS Take 1 tablet by mouth at bedtime as needed (sleep).    . metoprolol tartrate (LOPRESSOR) 25 MG tablet Take 1 tablet (25 mg total) by mouth 2 (two) times daily. 180 tablet 1  . Multiple Vitamins-Minerals (ONE DAILY MULTIVITAMIN MEN) TABS Take 1 tablet by mouth every evening.     . pantoprazole (PROTONIX) 40  MG tablet Take 40 mg by mouth 2 (two) times daily.     Marland Kitchen zolpidem (AMBIEN) 10 MG tablet Take 5 mg by mouth at bedtime.   0   No current facility-administered medications for this visit.     Allergies:   Francisco Padilla [fexofenadine]; Aspirin; Benadryl [diphenhydramine hcl]; Nsaids; and Oxycodone hcl    Social History:  The patient  reports that he has never smoked. He has never used smokeless tobacco. He reports that he does not drink alcohol or use drugs.   Family History:  The patient's family history includes Heart attack in his father; Heart disease in his father and mother; Heart failure in his mother.    ROS:  Please see the history of present illness.   Otherwise, review of systems are positive for anxiety and stress.   All other systems are reviewed and negative.    PHYSICAL EXAM: VS:  BP 118/78   Pulse 66   Ht 6\' 4"  (1.93 m)   Wt 284 lb 9.6 oz (129.1 kg)   BMI 34.64 kg/m  , BMI Body mass index is 34.64 kg/m. GEN: Well nourished, well developed, in no acute distress  HEENT: normal  Neck: no JVD, carotid bruits, or masses Cardiac: Irregularly irregular normal rate; no murmurs, rubs, or gallops,no edema  Respiratory:  clear to auscultation bilaterally, normal work of breathing GI: soft, nontender, nondistended, + BS MS: no deformity or atrophy  Skin: warm and dry, no rash Neuro:  Strength and sensation are intact Psych: euthymic mood, full affect    Recent Labs: No results found for requested labs within last 8760 hours.   Lipid Panel No results found for: CHOL, TRIG, HDL, CHOLHDL, VLDL, LDLCALC, LDLDIRECT   Other studies Reviewed: Additional studies/ records that were reviewed today with results demonstrating: none.   ASSESSMENT AND PLAN:  1. Atrial fibrillation: persistent atrial fibrillation. Lengthy discussion. We will continue with rate control. He has seen [name] as well as Dr. Deno Padilla. If necessary, consultation with Dr. Rayann Padilla however at this time since we are  going to continue with rate control there is no need. Doing well with low-dose metoprolol. Continue. 2. Chronic anticoagulation-continue with Eliquis. No bleeding. Prior gastric ulcer. Doing well. PPI. 3. Anxiety-monthly visits at [facility].  4. Orthostatic hypotension - liberalize salt intake, fluids, pickles for instance. Interestingly, he remembers Dr. Felipa Eth giving his mother the same advice. 5. OSA - needs CPAP - Dr. Felipa Eth may wish to refer to Dr. Carlena Sax.    Current medicines are reviewed at length with the patient today.  The patient concerns regarding his medicines were addressed.  The following changes have been made:  No change  Labs/ tests ordered today include:   No orders of the defined types were placed in this encounter.   Disposition:   FU in 45months   Signed, Candee Furbish, MD  03/22/2016 5:04 PM    [facility] [place], [place], [place]  [address] Phone: [phone]; Fax: [phone]

## 2016-03-22 NOTE — Patient Instructions (Signed)
Medication Instructions:  The current medical regimen is effective;  continue present plan and medications.  Please increase your sodium and fluid intake to help with hypotension.  Follow-Up: Follow up in 1 year with Dr. Marlou Porch.  You will receive a letter in the mail 2 months before you are due.  Please call us when you receive this letter to schedule your follow up appointment.  If you need a refill on your cardiac medications before your next appointment, please call your pharmacy.  Thank you for choosing [facility]!!     Ask Dr Felipa Eth about seeing Dr Carlena Sax for sleep apnea

## 2016-04-23 ENCOUNTER — Other Ambulatory Visit: Payer: Self-pay | Admitting: Cardiology

## 2017-02-21 DIAGNOSIS — S3992XA Unspecified injury of lower back, initial encounter: Secondary | ICD-10-CM | POA: Diagnosis not present

## 2017-02-21 DIAGNOSIS — M5136 Other intervertebral disc degeneration, lumbar region: Secondary | ICD-10-CM | POA: Diagnosis not present

## 2017-02-21 DIAGNOSIS — M545 Low back pain: Secondary | ICD-10-CM | POA: Diagnosis not present

## 2017-02-21 DIAGNOSIS — M47896 Other spondylosis, lumbar region: Secondary | ICD-10-CM | POA: Diagnosis not present

## 2017-02-22 ENCOUNTER — Telehealth: Payer: Self-pay | Admitting: Cardiology

## 2017-02-22 NOTE — Telephone Encounter (Signed)
Returned call to patient. Patient refusing to talk about the symptoms noted below. Patient demanding to be seen by Dr. Marlou Porch in August for his yearly follow up. Patient refused to be seen by APP or waitlisted. I told the patient that I wanted more information about his symptoms and that I would try to find him with an appointment with APP on Dr. Marlou Porch team. Patient states that he is going to be seen by PCP tomorrow and that he will wait for next available with Dr. Marlou Porch. Offered 10/31. Patient did not want to be seen on Halloween. Offered 11/1 and patient agreed. Patient made aware that we would put him on the waitlist in case we can see him sooner.

## 2017-02-22 NOTE — Telephone Encounter (Signed)
New message    Pt is calling. Pt wants to see Dr. Marlou Porch only. Offered to schedule with APP and put on wait list. Pt said if he wanted to see APP he would see another doctor.  Pt c/o swelling: STAT is pt has developed SOB within 24 hours  1. How long have you been experiencing swelling? Months. Pt states his leg is getting bluer.  2. Where is the swelling located? Left leg  3.  Are you currently taking a "fluid pill"? No  4.  Are you currently SOB? No   5.  Have you traveled recently? No   Pt c/o Syncope: STAT if syncope occurred within 30 minutes and pt complains of lightheadedness High Priority if episode of passing out, completely, today or in last 24 hours   1. Did you pass out today? No    2. When is the last time you passed out? Last night  3. Has this occurred multiple times? Yes    4. Did you have any symptoms prior to passing out? Pt states he got up too quickly.

## 2017-02-26 DIAGNOSIS — I878 Other specified disorders of veins: Secondary | ICD-10-CM | POA: Diagnosis not present

## 2017-02-26 DIAGNOSIS — M549 Dorsalgia, unspecified: Secondary | ICD-10-CM | POA: Diagnosis not present

## 2017-02-26 DIAGNOSIS — I10 Essential (primary) hypertension: Secondary | ICD-10-CM | POA: Diagnosis not present

## 2017-02-26 DIAGNOSIS — I482 Chronic atrial fibrillation: Secondary | ICD-10-CM | POA: Diagnosis not present

## 2017-03-06 ENCOUNTER — Other Ambulatory Visit: Payer: Self-pay | Admitting: Geriatric Medicine

## 2017-03-06 ENCOUNTER — Ambulatory Visit
Admission: RE | Admit: 2017-03-06 | Discharge: 2017-03-06 | Disposition: A | Discharge status: Home / Self Care | Payer: BLUE CROSS/BLUE SHIELD | Source: Ambulatory Visit | Attending: Geriatric Medicine | Admitting: Geriatric Medicine

## 2017-03-06 DIAGNOSIS — M5441 Lumbago with sciatica, right side: Secondary | ICD-10-CM

## 2017-04-01 DIAGNOSIS — Z23 Encounter for immunization: Secondary | ICD-10-CM | POA: Diagnosis not present

## 2017-05-15 DIAGNOSIS — F410 Panic disorder [episodic paroxysmal anxiety] without agoraphobia: Secondary | ICD-10-CM | POA: Diagnosis not present

## 2017-05-15 DIAGNOSIS — F332 Major depressive disorder, recurrent severe without psychotic features: Secondary | ICD-10-CM | POA: Diagnosis not present

## 2017-05-15 DIAGNOSIS — F411 Generalized anxiety disorder: Secondary | ICD-10-CM | POA: Diagnosis not present

## 2017-05-15 DIAGNOSIS — F902 Attention-deficit hyperactivity disorder, combined type: Secondary | ICD-10-CM | POA: Diagnosis not present

## 2017-05-24 ENCOUNTER — Encounter: Payer: Self-pay | Admitting: Cardiology

## 2017-05-24 ENCOUNTER — Encounter (INDEPENDENT_AMBULATORY_CARE_PROVIDER_SITE_OTHER): Payer: Self-pay

## 2017-05-24 ENCOUNTER — Ambulatory Visit (INDEPENDENT_AMBULATORY_CARE_PROVIDER_SITE_OTHER): Payer: Medicare Other | Admitting: Cardiology

## 2017-05-24 VITALS — BP 116/68 | HR 76 | Ht 76.0 in | Wt 287.6 lb

## 2017-05-24 DIAGNOSIS — I482 Chronic atrial fibrillation, unspecified: Secondary | ICD-10-CM

## 2017-05-24 DIAGNOSIS — Z7901 Long term (current) use of anticoagulants: Secondary | ICD-10-CM | POA: Diagnosis not present

## 2017-05-24 DIAGNOSIS — I951 Orthostatic hypotension: Secondary | ICD-10-CM | POA: Diagnosis not present

## 2017-05-24 NOTE — Patient Instructions (Signed)

## 2017-05-24 NOTE — Progress Notes (Signed)
Patient ID: Francisco Padilla, male   DOB: 26-Jun-1951, 66 y.o.   MRN: [id number]     Cardiology Office Note   Date:  05/24/2017   ID:  Francisco Padilla, DOB 18-Oct-1950, MRN [id number]  PCP:  Lajean Manes, MD    Wt Readings from Last 3 Encounters:  05/24/17 287 lb 9.6 oz (130.5 kg)  03/22/16 284 lb 9.6 oz (129.1 kg)  09/03/15 (!) 302 lb 12.8 oz (137.3 kg)       History of Present Illness: Francisco Padilla is a 66 y.o. male  with a hx of PAF, prior GI bleed, obesity s/p gastric bypass in 2008, HL, OSA.  Left leg swelling, chronic, Dr. Donnetta Hutching, vein stripping.  Saw Dr. Thomasene Ripple at [place]. Started Eliquis. Dr. Deno Etienne as well.   He returns for FU.   He is overall feeling better since starting metoprolol. He denies chest pain or significant shortness of breath. He denies syncope or near-syncope. He denies orthopnea or PND. He has chronic left ankle swelling.  No ulcer on EGD from Dr. Wynetta Emery 01/05/15. OK for Eliquis.   He reports issues with anxiety and stress due to his husband's stroke. Francisco Padilla He is the primary caretaker. I saw him several years ago, performed heart cath.   09/03/15-multiple EP consultations. I think that rhythm control would be very challenging for him. Persistent atrial fibrillation. We will go ahead and continue with rate control. No chest pain, no syncope, no bleeding. Tolerating anticoagulation well.  03/22/16-he's been quite tired. Exhausted at times. Discussed how taxing and is once again taking care of his husband [name]. Orthostatics demonstrated decreased blood pressure in the 90s mostly. His blood pressure at rest was 118/78. He states that his mother was very similar. He is only taking low-dose metoprolol 25 mg twice a day. Encouraged salt.  05/24/17  - 3 months syncope, L1 fracture. 3 weeks ago husband broke hip. Afib is bad he states.   -Doing very well currently today.  No shortness of breath.  He does have chronic knee pain especially left.  Left leg swells more  because of inflammation.   Past Medical History:  Diagnosis Date  . ADD (attention deficit disorder)   . Asymptomatic gallstones   . Atrial fibrillation ([place])   . Atrial flutter ([place])   . Depression   . DJD (degenerative joint disease) of knee   . Dysrhythmia    Chronic Atrial Fib- seeing Dr. Lenna Sciara [facility]  . H/O peptic ulcer    past history with GI bleed- cauterization done throught scope  . History of kidney stones    past history-no problem now  . Hypercholesterolemia   . Orthostatic hypotension   . Prediabetes   . Pulmonary nodules   . Sleep apnea    cpap  . Thrombocytopenia ([place])   . Thyromegaly   . Varicose veins     Past Surgical History:  Procedure Laterality Date  . ESOPHAGOGASTRODUODENOSCOPY (EGD) WITH PROPOFOL N/A 01/05/2015   Procedure: ESOPHAGOGASTRODUODENOSCOPY (EGD) WITH PROPOFOL;  Surgeon: Garlan Fair, MD;  Location: [facility];  Service: Endoscopy;  Laterality: N/A;  . GASTRIC ROUX-EN-Y     '08-[name] (weight stable around 302 #  . KNEE ARTHROSCOPY    . TONSILLECTOMY       Current Outpatient Prescriptions  Medication Sig Dispense Refill  . acetaminophen (TYLENOL) 500 MG tablet Take 500 mg by mouth every 6 (six) hours as needed for moderate pain.    Marland Kitchen atorvastatin (LIPITOR) 40  MG tablet Take 40 mg by mouth daily.     . Calcium Citrate-Vitamin D (CALCIUM CITRATE + D PO) Take 1 tablet by mouth daily.    Marland Kitchen ELIQUIS 5 MG TABS tablet Take 5 mg by mouth 2 (two) times daily.   0  . escitalopram (LEXAPRO) 20 MG tablet Take 1.5 mg by mouth daily.     Marland Kitchen glucosamine-chondroitin 500-400 MG tablet Take 3 tablets by mouth every evening.    Marland Kitchen LORazepam (ATIVAN) 0.5 MG tablet Take 0.5 mg by mouth every 6 (six) hours as needed for anxiety.   0  . Melatonin 10 MG TABS Take 1 tablet by mouth at bedtime as needed (sleep).    . metoprolol tartrate (LOPRESSOR) 25 MG tablet take 1 tablet by mouth twice a day 180 tablet 3  . Multiple Vitamins-Minerals (ONE  DAILY MULTIVITAMIN MEN) TABS Take 1 tablet by mouth every evening.     . pantoprazole (PROTONIX) 40 MG tablet Take 40 mg by mouth 2 (two) times daily.     Marland Kitchen zolpidem (AMBIEN) 10 MG tablet Take 10 mg by mouth at bedtime.   0   No current facility-administered medications for this visit.     Allergies:   Allegra [fexofenadine]; Aspirin; Benadryl [diphenhydramine hcl]; Nsaids; and Oxycodone hcl    Social History:  The patient  reports that he has never smoked. He has never used smokeless tobacco. He reports that he does not drink alcohol or use drugs.   Family History:  The patient's family history includes Heart attack in his father; Heart disease in his father and mother; Heart failure in his mother.    ROS:  Please see the history of present illness.   Otherwise, review of systems are positive for anxiety and stress.   All other systems are reviewed and negative.    PHYSICAL EXAM: VS:  BP 116/68   Pulse 76   Ht 6\' 4"  (1.93 m)   Wt 287 lb 9.6 oz (130.5 kg)   BMI 35.01 kg/m  , BMI Body mass index is 35.01 kg/m. GEN: Well nourished, well developed, in no acute distress  HEENT: normal  Neck: no JVD, carotid bruits, or masses Cardiac: irreg; no murmurs, rubs, or gallops,no edema  Respiratory:  clear to auscultation bilaterally, normal work of breathing GI: soft, nontender, nondistended, + BS MS: no deformity or atrophy  Skin: warm and dry, no rash Neuro:  Alert and Oriented x 3, Strength and sensation are intact Psych: euthymic mood, full affect     Recent Labs: No results found for requested labs within last 8760 hours.   Lipid Panel No results found for: CHOL, TRIG, HDL, CHOLHDL, VLDL, LDLCALC, LDLDIRECT   Other studies Reviewed: Additional studies/ records that were reviewed today with results demonstrating: none.  EKG: Today 05/24/17-atrial fibrillation 76 with no other abnormalities.  ASSESSMENT AND PLAN:  1. Atrial fibrillation: Permanent atrial fibrillation.  Lengthy discussion again today. We will continue with rate control.  Heart rate is in the 70s.  He has seen [name] as well as Dr. Deno Etienne. If necessary, consultation with Dr. Rayann Heman however at this time since we are going to continue with rate control there is no need. Doing well with low-dose metoprolol. Continue. 2. Chronic anticoagulation-continue with Eliquis. No bleeding. Prior gastric ulcer. Doing well. PPI. 3. Anxiety-monthly visits at [facility]  Stable. 4. Orthostatic hypotension - liberalize salt intake, fluids, pickles for instance. Interestingly, he remembers Dr. Felipa Eth giving his mother the same advice.  He did  have a brief near syncope episode fell, fractured his     Current medicines are reviewed at length with the patient today.  The patient concerns regarding his medicines were addressed.  The following changes have been made:  No change  Labs/ tests ordered today include:   Orders Placed This Encounter  Procedures  . EKG 12-Lead    Disposition:   FU in 19months   Signed, Candee Furbish, MD  05/24/2017 4:48 PM    [facility] [place], [place], [place]  [address] Phone: [phone]; Fax: [phone]

## 2017-05-25 DIAGNOSIS — S32010D Wedge compression fracture of first lumbar vertebra, subsequent encounter for fracture with routine healing: Secondary | ICD-10-CM | POA: Diagnosis not present

## 2017-05-25 DIAGNOSIS — I482 Chronic atrial fibrillation: Secondary | ICD-10-CM | POA: Diagnosis not present

## 2017-06-04 DIAGNOSIS — I482 Chronic atrial fibrillation: Secondary | ICD-10-CM | POA: Diagnosis not present

## 2017-06-04 DIAGNOSIS — I10 Essential (primary) hypertension: Secondary | ICD-10-CM | POA: Diagnosis not present

## 2017-06-04 DIAGNOSIS — I872 Venous insufficiency (chronic) (peripheral): Secondary | ICD-10-CM | POA: Diagnosis not present

## 2017-06-04 DIAGNOSIS — M79671 Pain in right foot: Secondary | ICD-10-CM | POA: Diagnosis not present

## 2017-06-07 DIAGNOSIS — S32010D Wedge compression fracture of first lumbar vertebra, subsequent encounter for fracture with routine healing: Secondary | ICD-10-CM | POA: Diagnosis not present

## 2017-06-09 ENCOUNTER — Telehealth: Payer: Self-pay | Admitting: Cardiology

## 2017-06-09 DIAGNOSIS — R42 Dizziness and giddiness: Secondary | ICD-10-CM | POA: Diagnosis not present

## 2017-06-09 DIAGNOSIS — I959 Hypotension, unspecified: Secondary | ICD-10-CM | POA: Diagnosis not present

## 2017-06-09 NOTE — Telephone Encounter (Signed)
I was notified by the operator to call the patient. He has AF and chronic orthostatic hypotension. He reports that he noticed low blood pressures with SBP in the 80s this AM. He liberalized the salt in his diet, and his SBP was 120s this afternoon. This evening his SBP was 109.   He states that he has chronic lightheadedness. Yesterday he had a fall but did not hit his head. He has no current symptoms. He asks what to do about his current management.  He was encouraged to hold his BP medications this evening. He was instructed to come to the ED with any worsening lightheadedness or any other new or severe symptoms.

## 2017-06-12 ENCOUNTER — Ambulatory Visit (INDEPENDENT_AMBULATORY_CARE_PROVIDER_SITE_OTHER): Payer: Self-pay

## 2017-06-12 ENCOUNTER — Ambulatory Visit (INDEPENDENT_AMBULATORY_CARE_PROVIDER_SITE_OTHER): Payer: Medicare Other | Admitting: Orthopedic Surgery

## 2017-06-12 ENCOUNTER — Telehealth: Payer: Self-pay | Admitting: Cardiology

## 2017-06-12 ENCOUNTER — Encounter (INDEPENDENT_AMBULATORY_CARE_PROVIDER_SITE_OTHER): Payer: Self-pay | Admitting: Orthopedic Surgery

## 2017-06-12 DIAGNOSIS — G4733 Obstructive sleep apnea (adult) (pediatric): Secondary | ICD-10-CM | POA: Diagnosis not present

## 2017-06-12 DIAGNOSIS — M79671 Pain in right foot: Secondary | ICD-10-CM | POA: Diagnosis not present

## 2017-06-12 DIAGNOSIS — G8929 Other chronic pain: Secondary | ICD-10-CM

## 2017-06-12 DIAGNOSIS — M25552 Pain in left hip: Secondary | ICD-10-CM

## 2017-06-12 DIAGNOSIS — M25562 Pain in left knee: Secondary | ICD-10-CM

## 2017-06-12 DIAGNOSIS — M25561 Pain in right knee: Secondary | ICD-10-CM

## 2017-06-12 NOTE — Progress Notes (Signed)
Office Visit Note   Patient: Francisco Padilla           Date of Birth: 11/09/1950           MRN: [id number] Visit Date: 06/12/2017              Requested by: Lajean Manes, MD [address] [place], [place] [address] PCP: Lajean Manes, MD  Chief Complaint  Patient presents with  . Right Foot - Pain  . Left Knee - Pain  . Left Hip - Pain  . Right Knee - Pain      HPI: Patient is a 66 year old gentleman with multiple medical problems patient states that recently he fell and had a L1 compression fracture back in September.  Patient states that he is taking care of his husband has to do a lot of heavy lifting due to his husband having a stroke and he being the only caregiver.  Patient complains of knee pain back pain and left hip pain from a fall.  Patient states that his hip is feeling better he states his blood pressure is 76/56 and this has improved since he has discontinued his beta-blocker today.  Patient states that his heel pain is doing better after wearing new balance sneakers.  Assessment & Plan: Visit Diagnoses:  1. Chronic pain of right knee   2. Chronic pain of left knee   3. Pain in left hip   4. Pain of right heel     Plan: Both knees were injected without complications discussed we could continue with injections as needed.  Recommended that he use a ThermaCare heating belt for his lower back flareups.  Follow-Up Instructions: Return if symptoms worsen or fail to improve.   Ortho Exam  Patient is alert, oriented, no adenopathy, well-dressed, normal affect, normal respiratory effort. Examination patient has an antalgic gait.  He has decreased range of motion of both hips but this is not painful.  His external rotation bilaterally of 30 degrees internal rotation of 0 degrees.  He has a negative straight leg raise bilaterally.  He is maximally tender to palpation of medial joint line both knees collaterals and cruciates are stable in both knees.  His to  palpation.  Imaging: No results found. No images are attached to the encounter.  Labs: Lab Results  Component Value Date   REPTSTATUS 10/23/2009 FINAL 10/21/2009   CULT NO GROWTH 10/21/2009    @LABSALLVALUES (HGBA1)@  @BMI1 @  Orders:  Orders Placed This Encounter  Procedures  . XR Knee 1-2 Views Right  . XR Knee 1-2 Views Left  . XR HIP UNILAT W OR W/O PELVIS 2-3 VIEWS LEFT  . XR Os Calcis Right   No orders of the defined types were placed in this encounter.    Procedures: Large Joint Inj: bilateral knee on 06/12/2017 5:48 PM Indications: pain and diagnostic evaluation Details: 22 G 1.5 in needle, anteromedial approach  Arthrogram: No  Outcome: tolerated well, no immediate complications Procedure, treatment alternatives, risks and benefits explained, specific risks discussed. Consent was given by the patient. Immediately prior to procedure a time out was called to verify the correct patient, procedure, equipment, support staff and site/side marked as required. Patient was prepped and draped in the usual sterile fashion.      Clinical Data: No additional findings.  ROS:  All other systems negative, except as noted in the HPI. Review of Systems  Objective: Vital Signs: There were no vitals taken for this visit.  Specialty Comments:  No specialty comments available.  PMFS History: Patient Active Problem List   Diagnosis Date Noted  . Chronic anticoagulation 03/05/2015  . Arterial blood pressure decreased 10/28/2014  . Atrial fibrillation ([place]) 09/02/2014  . Morbid obesity ([place]) 09/02/2014  . AF (paroxysmal atrial fibrillation) ([place]) 08/03/2014  . Gastroduodenal ulcer 08/03/2014  . Apnea, sleep 08/03/2014  . Glaucoma suspect 02/21/2013  . Herpes 04/11/2012  . Routine general medical examination at a health care facility 04/11/2012  . DYSLIPIDEMIA 05/31/2009  . DEPRESSION/ANXIETY 05/31/2009  . ATTENTION DEFICIT DISORDER 05/31/2009  . Obstructive sleep  apnea 05/31/2009  . GERD 05/31/2009  . GOUT, HX OF 05/31/2009   Past Medical History:  Diagnosis Date  . ADD (attention deficit disorder)   . Asymptomatic gallstones   . Atrial fibrillation ([place])   . Atrial flutter ([place])   . Depression   . DJD (degenerative joint disease) of knee   . Dysrhythmia    Chronic Atrial Fib- seeing Dr. Lenna Sciara [facility]  . H/O peptic ulcer    past history with GI bleed- cauterization done throught scope  . History of kidney stones    past history-no problem now  . Hypercholesterolemia   . Orthostatic hypotension   . Prediabetes   . Pulmonary nodules   . Sleep apnea    cpap  . Thrombocytopenia ([place])   . Thyromegaly   . Varicose veins     Family History  Problem Relation Age of Onset  . Heart disease Mother   . Heart failure Mother   . Heart disease Father   . Heart attack Father   . Stroke Neg Hx     Past Surgical History:  Procedure Laterality Date  . ESOPHAGOGASTRODUODENOSCOPY (EGD) WITH PROPOFOL N/A 01/05/2015   Procedure: ESOPHAGOGASTRODUODENOSCOPY (EGD) WITH PROPOFOL;  Surgeon: Garlan Fair, MD;  Location: [facility];  Service: Endoscopy;  Laterality: N/A;  . GASTRIC ROUX-EN-Y     '08-[name] (weight stable around 302 #  . KNEE ARTHROSCOPY    . TONSILLECTOMY     Social History   Occupational History  . Not on file  Tobacco Use  . Smoking status: Never Smoker  . Smokeless tobacco: Never Used  Substance and Sexual Activity  . Alcohol use: No    Alcohol/week: 0.0 oz  . Drug use: No  . Sexual activity: Yes

## 2017-06-12 NOTE — Telephone Encounter (Signed)
Left message to c/b to discuss.  Reviewed information with Dr Marlou Porch who gives orders to continue to hold Metoprolol as long as BP is low but he needs to restart Lipitor.

## 2017-06-12 NOTE — Telephone Encounter (Signed)
New message   Patient states he is not taking his Lipitor or Metoprolol because too low BP. Does not plan on taking until Dr. Marlou Porch confirms he should take. Saturday  BP 80/53 went to ED after passing out. Please call  Pt c/o BP issue: STAT if pt c/o blurred vision, one-sided weakness or slurred speech  1. What are your last 5 BP readings? 119/67, 97/58, 99/59, 96/54, 115/73,   2. Are you having any other symptoms (ex. Dizziness, headache, blurred vision, passed out)? Passed out Saturday went to ED.  3. What is your BP issue? BP keeps dropping

## 2017-06-12 NOTE — Telephone Encounter (Signed)
Spoke with patient who is aware to continue to hold Metoprolol and monitor BP.  He is aware to restart Lipitor.  He will c/b if further issues or concerns.

## 2017-06-25 ENCOUNTER — Other Ambulatory Visit: Payer: Self-pay | Admitting: Cardiology

## 2017-07-06 DIAGNOSIS — H1851 Endothelial corneal dystrophy: Secondary | ICD-10-CM | POA: Diagnosis not present

## 2017-07-06 DIAGNOSIS — H2513 Age-related nuclear cataract, bilateral: Secondary | ICD-10-CM | POA: Diagnosis not present

## 2017-07-06 DIAGNOSIS — H16223 Keratoconjunctivitis sicca, not specified as Sjogren's, bilateral: Secondary | ICD-10-CM | POA: Diagnosis not present

## 2017-07-06 DIAGNOSIS — H40013 Open angle with borderline findings, low risk, bilateral: Secondary | ICD-10-CM | POA: Diagnosis not present

## 2017-07-06 DIAGNOSIS — H527 Unspecified disorder of refraction: Secondary | ICD-10-CM | POA: Diagnosis not present

## 2017-07-23 DIAGNOSIS — M542 Cervicalgia: Secondary | ICD-10-CM | POA: Diagnosis not present

## 2017-07-23 DIAGNOSIS — R0789 Other chest pain: Secondary | ICD-10-CM | POA: Diagnosis not present

## 2017-07-23 DIAGNOSIS — G44209 Tension-type headache, unspecified, not intractable: Secondary | ICD-10-CM | POA: Diagnosis not present

## 2017-07-31 DIAGNOSIS — G4733 Obstructive sleep apnea (adult) (pediatric): Secondary | ICD-10-CM | POA: Diagnosis not present

## 2017-08-16 DIAGNOSIS — I482 Chronic atrial fibrillation: Secondary | ICD-10-CM | POA: Diagnosis not present

## 2017-08-16 DIAGNOSIS — G4733 Obstructive sleep apnea (adult) (pediatric): Secondary | ICD-10-CM | POA: Diagnosis not present

## 2017-08-16 DIAGNOSIS — I10 Essential (primary) hypertension: Secondary | ICD-10-CM | POA: Diagnosis not present

## 2017-08-16 DIAGNOSIS — F900 Attention-deficit hyperactivity disorder, predominantly inattentive type: Secondary | ICD-10-CM | POA: Diagnosis not present

## 2017-08-16 DIAGNOSIS — Z79899 Other long term (current) drug therapy: Secondary | ICD-10-CM | POA: Diagnosis not present

## 2017-09-20 DIAGNOSIS — N390 Urinary tract infection, site not specified: Secondary | ICD-10-CM | POA: Diagnosis not present

## 2017-09-20 DIAGNOSIS — W19XXXA Unspecified fall, initial encounter: Secondary | ICD-10-CM | POA: Diagnosis not present

## 2017-09-20 DIAGNOSIS — S81811A Laceration without foreign body, right lower leg, initial encounter: Secondary | ICD-10-CM | POA: Diagnosis not present

## 2017-10-10 DIAGNOSIS — I10 Essential (primary) hypertension: Secondary | ICD-10-CM | POA: Diagnosis not present

## 2017-10-10 DIAGNOSIS — F439 Reaction to severe stress, unspecified: Secondary | ICD-10-CM | POA: Diagnosis not present

## 2017-10-10 DIAGNOSIS — S81811A Laceration without foreign body, right lower leg, initial encounter: Secondary | ICD-10-CM | POA: Diagnosis not present

## 2017-10-10 DIAGNOSIS — F900 Attention-deficit hyperactivity disorder, predominantly inattentive type: Secondary | ICD-10-CM | POA: Diagnosis not present

## 2017-10-10 DIAGNOSIS — I482 Chronic atrial fibrillation: Secondary | ICD-10-CM | POA: Diagnosis not present

## 2017-12-11 DIAGNOSIS — I482 Chronic atrial fibrillation: Secondary | ICD-10-CM | POA: Diagnosis not present

## 2017-12-11 DIAGNOSIS — I10 Essential (primary) hypertension: Secondary | ICD-10-CM | POA: Diagnosis not present

## 2017-12-11 DIAGNOSIS — D500 Iron deficiency anemia secondary to blood loss (chronic): Secondary | ICD-10-CM | POA: Diagnosis not present

## 2018-01-08 DIAGNOSIS — B0089 Other herpesviral infection: Secondary | ICD-10-CM | POA: Diagnosis not present

## 2018-02-26 DIAGNOSIS — K2270 Barrett's esophagus without dysplasia: Secondary | ICD-10-CM | POA: Diagnosis not present

## 2018-02-26 DIAGNOSIS — I10 Essential (primary) hypertension: Secondary | ICD-10-CM | POA: Diagnosis not present

## 2018-02-26 DIAGNOSIS — Z125 Encounter for screening for malignant neoplasm of prostate: Secondary | ICD-10-CM | POA: Diagnosis not present

## 2018-02-26 DIAGNOSIS — F3340 Major depressive disorder, recurrent, in remission, unspecified: Secondary | ICD-10-CM | POA: Diagnosis not present

## 2018-02-26 DIAGNOSIS — Z0000 Encounter for general adult medical examination without abnormal findings: Secondary | ICD-10-CM | POA: Diagnosis not present

## 2018-02-26 DIAGNOSIS — Z23 Encounter for immunization: Secondary | ICD-10-CM | POA: Diagnosis not present

## 2018-02-26 DIAGNOSIS — E7800 Pure hypercholesterolemia, unspecified: Secondary | ICD-10-CM | POA: Diagnosis not present

## 2018-02-26 DIAGNOSIS — I482 Chronic atrial fibrillation: Secondary | ICD-10-CM | POA: Diagnosis not present

## 2018-02-26 DIAGNOSIS — F900 Attention-deficit hyperactivity disorder, predominantly inattentive type: Secondary | ICD-10-CM | POA: Diagnosis not present

## 2018-02-26 DIAGNOSIS — Z79899 Other long term (current) drug therapy: Secondary | ICD-10-CM | POA: Diagnosis not present

## 2018-03-16 DIAGNOSIS — Z23 Encounter for immunization: Secondary | ICD-10-CM | POA: Diagnosis not present

## 2018-03-22 ENCOUNTER — Other Ambulatory Visit: Payer: Self-pay | Admitting: Gastroenterology

## 2018-03-22 DIAGNOSIS — R1314 Dysphagia, pharyngoesophageal phase: Secondary | ICD-10-CM | POA: Diagnosis not present

## 2018-03-22 DIAGNOSIS — K2270 Barrett's esophagus without dysplasia: Secondary | ICD-10-CM | POA: Diagnosis not present

## 2018-03-26 ENCOUNTER — Telehealth: Payer: Self-pay

## 2018-03-26 NOTE — Telephone Encounter (Signed)
   [facility] Pre-operative Risk Assessment    Request for surgical clearance:  1. What type of surgery is being performed? Endoscopy   2. When is this surgery scheduled?  04/18/18   3. What type of clearance is required (medical clearance vs. Pharmacy clearance to hold med vs. Both)?  Both  4. Are there any medications that need to be held prior to surgery and how long? Eliquis 1 day   5. Practice name and name of physician performing surgery?  [facility]/Dr Therisa Doyne   6. What is your office phone number [phone]    7.   What is your office fax number [phone]  8.   Anesthesia type (None, local, MAC, general) ? MAC   Frederik Schmidt 03/26/2018, 2:14 PM  _________________________________________________________________   (provider comments below)

## 2018-03-27 NOTE — Telephone Encounter (Signed)
Spoke with pt and he has been made aware that he needs to be seen in the office before we can clear him for his Endoscopy scheduled 04/18/18. Dr. Marlou Porch had an opening 03/29/18 @ 4 so pt took that appt.  Will fax over to [facility] to let them know.  Pt verbalized understanding.

## 2018-03-27 NOTE — Telephone Encounter (Signed)
   Primary Cardiologist:Mark Marlou Porch, MD  (Last seen 05/2017)  Chart reviewed as part of pre-operative protocol coverage. Because of Ruxin Ransome Romey's past medical history and time since last visit, he/she will require a follow-up visit in order to better assess preoperative cardiovascular risk.  Pre-op covering staff: - Please schedule appointment and call patient to inform them. - Please contact requesting surgeon's office via preferred method (i.e, phone, fax) to inform them of need for appointment prior to surgery.  [place], [place]  03/27/2018, 1:50 PM

## 2018-03-28 ENCOUNTER — Encounter: Payer: Self-pay | Admitting: Cardiology

## 2018-03-29 ENCOUNTER — Encounter: Payer: Self-pay | Admitting: Cardiology

## 2018-03-29 ENCOUNTER — Ambulatory Visit (INDEPENDENT_AMBULATORY_CARE_PROVIDER_SITE_OTHER): Payer: Medicare Other | Admitting: Cardiology

## 2018-03-29 VITALS — BP 112/80 | HR 89 | Ht 76.0 in | Wt 290.6 lb

## 2018-03-29 DIAGNOSIS — I482 Chronic atrial fibrillation, unspecified: Secondary | ICD-10-CM

## 2018-03-29 DIAGNOSIS — Z7901 Long term (current) use of anticoagulants: Secondary | ICD-10-CM

## 2018-03-29 DIAGNOSIS — I951 Orthostatic hypotension: Secondary | ICD-10-CM

## 2018-03-29 NOTE — Patient Instructions (Signed)

## 2018-03-29 NOTE — Progress Notes (Signed)
Patient ID: Francisco Padilla, male   DOB: Mar 20, 1951, 67 y.o.   MRN: [id number]     Cardiology Office Note   Date:  03/29/2018   ID:  Francisco Padilla, DOB 22-Jun-1951, MRN [id number]  PCP:  Lajean Manes, MD    Wt Readings from Last 3 Encounters:  03/29/18 290 lb 9.6 oz (131.8 kg)  05/24/17 287 lb 9.6 oz (130.5 kg)  03/22/16 284 lb 9.6 oz (129.1 kg)       History of Present Illness: Francisco Padilla is a 67 y.o. male  with a hx of PAF, prior GI bleed, obesity s/p gastric bypass in 2008, HL, OSA.  Left leg swelling, chronic, Dr. Donnetta Hutching, vein stripping.  Saw Dr. Thomasene Ripple at [facility]. Started Eliquis. Dr. Deno Etienne as well.   He returns for FU.   He is overall feeling better since starting metoprolol. He denies chest pain or significant shortness of breath. He denies syncope or near-syncope. He denies orthopnea or PND. He has chronic left ankle swelling.  No ulcer on EGD from Dr. Wynetta Emery 01/05/15. OK for Eliquis.   He reports issues with anxiety and stress due to his husband's stroke. [name] He is the primary caretaker. I saw him several years ago, performed heart cath.   09/03/15-multiple EP consultations. I think that rhythm control would be very challenging for him. Persistent atrial fibrillation. We will go ahead and continue with rate control. No chest pain, no syncope, no bleeding. Tolerating anticoagulation well.  03/22/16-he's been quite tired. Exhausted at times. Discussed how taxing and is once again taking care of his husband [name]. Orthostatics demonstrated decreased blood pressure in the 90s mostly. His blood pressure at rest was 118/78. He states that his mother was very similar. He is only taking low-dose metoprolol 25 mg twice a day. Encouraged salt.  05/24/17  - 3 months syncope, L1 fracture. 3 weeks ago husband broke hip. Afib is bad he states.   -Doing very well currently today.  No shortness of breath.  He does have chronic knee pain especially left.  Left leg swells more  because of inflammation.  03/29/2018  - Off of metoprolol. Syncope 4 times. Increased salt.  Rate is very well controlled without metoprolol 89 bpm.  No changes made.  [name] unfortunately had a rough few months from medical standpoint, UTI, underwent circumcision, had thrombocytopenia at one point.  Was then [facility] for a while.  They are both very appreciative of Dr. Felipa Eth.   Past Medical History:  Diagnosis Date  . ADD (attention deficit disorder)   . Asymptomatic gallstones   . Atrial fibrillation ([place])   . Atrial flutter ([place])   . Depression   . DJD (degenerative joint disease) of knee   . Dysrhythmia    Chronic Atrial Fib- seeing Dr. Lenna Sciara [facility]  . H/O peptic ulcer    past history with GI bleed- cauterization done throught scope  . History of kidney stones    past history-no problem now  . Hypercholesterolemia   . Orthostatic hypotension   . Prediabetes   . Pulmonary nodules   . Sleep apnea    cpap  . Thrombocytopenia ([place])   . Thyromegaly   . Varicose veins     Past Surgical History:  Procedure Laterality Date  . ESOPHAGOGASTRODUODENOSCOPY (EGD) WITH PROPOFOL N/A 01/05/2015   Procedure: ESOPHAGOGASTRODUODENOSCOPY (EGD) WITH PROPOFOL;  Surgeon: Garlan Fair, MD;  Location: [facility] END[facility];  Service: Endoscopy;  Laterality: N/A;  . GASTRIC ROUX-EN-Y     '  08-[name] (weight stable around 302 #  . KNEE ARTHROSCOPY    . TONSILLECTOMY       Current Outpatient Medications  Medication Sig Dispense Refill  . acetaminophen (TYLENOL) 500 MG tablet Take 500 mg by mouth every 6 (six) hours as needed for moderate pain.    Marland Kitchen atorvastatin (LIPITOR) 40 MG tablet Take 40 mg by mouth daily.     . Calcium Citrate-Vitamin D (CALCIUM CITRATE + D PO) Take 1 tablet by mouth daily.    Marland Kitchen ELIQUIS 5 MG TABS tablet Take 5 mg by mouth 2 (two) times daily.   0  . escitalopram (LEXAPRO) 20 MG tablet Take 1.5 mg by mouth daily.     Marland Kitchen LORazepam (ATIVAN) 0.5 MG tablet Take 0.5 mg  by mouth every 6 (six) hours as needed for anxiety.   0  . Melatonin 10 MG TABS Take 1 tablet by mouth at bedtime as needed (sleep).    . Multiple Vitamins-Minerals (ONE DAILY MULTIVITAMIN MEN) TABS Take 1 tablet by mouth every evening.     . pantoprazole (PROTONIX) 40 MG tablet Take 40 mg by mouth 2 (two) times daily.     Marland Kitchen zolpidem (AMBIEN) 10 MG tablet Take 10 mg by mouth at bedtime.   0   No current facility-administered medications for this visit.     Allergies:   Allegra [fexofenadine]; Aspirin; Benadryl [diphenhydramine hcl]; Nsaids; and Oxycodone hcl    Social History:  The patient  reports that he has never smoked. He has never used smokeless tobacco. He reports that he does not drink alcohol or use drugs.   Family History:  The patient's family history includes Heart attack in his father; Heart disease in his father and mother; Heart failure in his mother.    ROS:  Please see the history of present illness.      PHYSICAL EXAM: VS:  BP 112/80   Pulse 89   Ht 6\' 4"  (1.93 m)   Wt 290 lb 9.6 oz (131.8 kg)   SpO2 95%   BMI 35.37 kg/m  , BMI Body mass index is 35.37 kg/m. GEN: Well nourished, well developed, in no acute distress  HEENT: normal  Neck: no JVD, carotid bruits, or masses Cardiac: irreg irreg; no murmurs, rubs, or gallops,no edema  Respiratory:  clear to auscultation bilaterally, normal work of breathing GI: soft, nontender, nondistended, + BS MS: no deformity or atrophy  Skin: warm and dry, no rash Neuro:  Alert and Oriented x 3, Strength and sensation are intact Psych: euthymic mood, full affect      Recent Labs: No results found for requested labs within last 8760 hours.   Lipid Panel No results found for: CHOL, TRIG, HDL, CHOLHDL, VLDL, LDLCALC, LDLDIRECT   Other studies Reviewed: Additional studies/ records that were reviewed today with results demonstrating: none.  EKG: Today 03/29/2018-atrial fibrillation 89 nonspecific ST-T wave changes  personally viewed 05/24/17-atrial fibrillation 76 with no other abnormalities.  ASSESSMENT AND PLAN:  1. Atrial fibrillation: Permanent atrial fibrillation. Lengthy discussion again today. We will continue with rate control.  Heart rate is in the 80s without low-dose metoprolol which he did not tolerate because of hypotension.  He has seen [name] as well as Dr. Deno Etienne.  2. Chronic anticoagulation-continue with Eliquis. No bleeding. Prior gastric ulcer.  Going to have endoscopy done soon.  See below.  Doing well. 3. Anxiety-monthly visits at [facility] have been put on hold because of Jerome's health status.  Stable. 4. Orthostatic hypotension -  liberalize salt intake, fluids, pickles for instance. Interestingly, he remembers Dr. Felipa Eth giving his mother the same advice.  No changes.  He is off of metoprolol.  Blood pressure today 112/80.  If this were to get worse, we could consider Midrin.  I would like to continue to treat this conservatively.  We have given him recommendations for compression hose, [employer] and [employer]. 5. Pre-op - Dr. Therisa Doyne.  EGD for Barrett's esophagus.  He may proceed with low overall cardiac risk.  He does have well-controlled atrial fibrillation.  I would advise holding the Eliquis for 2 days prior for biopsy.  Resume Eliquis day after procedure or when instructed.    Orders Placed This Encounter  Procedures  . EKG 12-Lead    Disposition:   FU in 68months   Signed, Candee Furbish, MD  03/29/2018 5:24 PM    [facility] [address], [place], [place]  [address] Phone: [phone]; Fax: [phone]

## 2018-04-15 ENCOUNTER — Other Ambulatory Visit: Payer: Self-pay

## 2018-04-15 ENCOUNTER — Encounter (HOSPITAL_COMMUNITY): Payer: Self-pay | Admitting: *Deleted

## 2018-04-17 DIAGNOSIS — G4733 Obstructive sleep apnea (adult) (pediatric): Secondary | ICD-10-CM | POA: Diagnosis not present

## 2018-04-17 NOTE — H&P (Signed)
  67 year old male was seen in the office on 03/22/18 for Barrett's esophagus and was advised a repeat EGD. EGD from 6/16 prior to starting Eliquis for A. Fib showed Barrett's esophagus, Roux-en-Y gastric bypass and no evidence of dysplasia or metaplasia. Colonoscopy from 2014 was normal and was recommended to repeat in 10 years.. Labs from 02/26/18 showed a normal CBC and CMP. He reports mild acid reflux, is on omeprazole twice a day and occasionally has heartburn, has been avoiding caffeinated products, denies smoking, avoid alcohol and denies recent weight loss. He feels tired and complains of chronic fatigue as he is the primary caregiver office 89 year old husband. He reports regular bowel movements and denies blood in stool or black stools. He complains of difficulty swallowing but denies pain on swallowing.  Weight 289 pounds Height 75.5 BMI 35.64  Gen. Appearance, well-developed, well-nourished, no active distress, pleasant, tall Eyes: sclerae are normal, no pallor Cardiovascular: no murmurs, no peripheral edema Respiratory:Breath sounds even and unlabored Neuro: normal strength, normal gait Psych: normal affect  Assessment and treatment Barrett's esophagus Pharyngoesophageal dysphagia  EGD with biopsies of esophagitis and possible balloon dilatation ASA of 3-4 with be performed as an outpatient at [facility]. We will need to hold Eliquis for at least 24 hours prior to procedure, we get clearance from his cardiologist Dr. Marlou Porch and PCP Dr. Felipa Eth.   Ronnette Juniper, M.D.

## 2018-04-18 ENCOUNTER — Other Ambulatory Visit: Payer: Self-pay

## 2018-04-18 ENCOUNTER — Encounter (HOSPITAL_COMMUNITY)
Admission: RE | Disposition: A | Discharge status: Home / Self Care | Payer: Self-pay | Source: Ambulatory Visit | Attending: Gastroenterology

## 2018-04-18 ENCOUNTER — Encounter (HOSPITAL_COMMUNITY): Payer: Self-pay

## 2018-04-18 ENCOUNTER — Ambulatory Visit (HOSPITAL_COMMUNITY)
Admission: RE | Admit: 2018-04-18 | Discharge: 2018-04-18 | Disposition: A | Discharge status: Home / Self Care | Payer: Medicare Other | Source: Ambulatory Visit | Attending: Gastroenterology | Admitting: Gastroenterology

## 2018-04-18 ENCOUNTER — Ambulatory Visit (HOSPITAL_COMMUNITY): Payer: Medicare Other | Admitting: Anesthesiology

## 2018-04-18 DIAGNOSIS — G4730 Sleep apnea, unspecified: Secondary | ICD-10-CM | POA: Insufficient documentation

## 2018-04-18 DIAGNOSIS — Z9989 Dependence on other enabling machines and devices: Secondary | ICD-10-CM | POA: Insufficient documentation

## 2018-04-18 DIAGNOSIS — K2270 Barrett's esophagus without dysplasia: Secondary | ICD-10-CM | POA: Insufficient documentation

## 2018-04-18 DIAGNOSIS — Z9884 Bariatric surgery status: Secondary | ICD-10-CM | POA: Insufficient documentation

## 2018-04-18 DIAGNOSIS — R1310 Dysphagia, unspecified: Secondary | ICD-10-CM | POA: Insufficient documentation

## 2018-04-18 HISTORY — PX: ESOPHAGOGASTRODUODENOSCOPY: SHX5428

## 2018-04-18 HISTORY — DX: Pneumonia, unspecified organism: J18.9

## 2018-04-18 HISTORY — PX: BIOPSY: SHX5522

## 2018-04-18 HISTORY — DX: Unspecified asthma, uncomplicated: J45.909

## 2018-04-18 SURGERY — EGD (ESOPHAGOGASTRODUODENOSCOPY)
Anesthesia: Monitor Anesthesia Care

## 2018-04-18 MED ORDER — PROPOFOL 10 MG/ML IV BOLUS
INTRAVENOUS | Status: AC
  Filled 2018-04-18: qty 40

## 2018-04-18 MED ORDER — SODIUM CHLORIDE 0.9 % IV SOLN: INTRAVENOUS | Status: DC

## 2018-04-18 MED ORDER — LACTATED RINGERS IV SOLN
INTRAVENOUS | Status: DC
  Administered 2018-04-18: 10:00:00 via INTRAVENOUS

## 2018-04-18 MED ORDER — LIDOCAINE 2% (20 MG/ML) 5 ML SYRINGE
INTRAMUSCULAR | Status: DC | PRN
  Administered 2018-04-18: 30 mg via INTRAVENOUS

## 2018-04-18 MED ORDER — PROPOFOL 10 MG/ML IV BOLUS
INTRAVENOUS | Status: DC | PRN
  Administered 2018-04-18 (×3): 20 mg via INTRAVENOUS
  Administered 2018-04-18: 30 mg via INTRAVENOUS
  Administered 2018-04-18 (×4): 20 mg via INTRAVENOUS
  Administered 2018-04-18: 30 mg via INTRAVENOUS

## 2018-04-18 NOTE — Transfer of Care (Signed)
Immediate Anesthesia Transfer of Care Note  Patient: Francisco Padilla  Procedure(s) Performed: ESOPHAGOGASTRODUODENOSCOPY (EGD) (N/A ) BIOPSY  Patient Location: PACU and Endoscopy Unit  Anesthesia Type:MAC  Level of Consciousness: awake, alert  and oriented  Airway & Oxygen Therapy: Patient Spontanous Breathing and Patient connected to nasal cannula oxygen  Post-op Assessment: Report given to RN and Post -op Vital signs reviewed and stable  Post vital signs: Reviewed and stable  Last Vitals:  Vitals Value Taken Time  BP    Temp    Pulse 84 04/18/2018 11:38 AM  Resp 13 04/18/2018 11:38 AM  SpO2 100 % 04/18/2018 11:38 AM  Vitals shown include unvalidated device data.  Last Pain:  Vitals:   04/18/18 0938  TempSrc: Oral  PainSc: 0-No pain         Complications: No apparent anesthesia complications

## 2018-04-18 NOTE — Anesthesia Postprocedure Evaluation (Signed)
Anesthesia Post Note  Patient: Francisco Padilla  Procedure(s) Performed: ESOPHAGOGASTRODUODENOSCOPY (EGD) (N/A ) BIOPSY     Patient location during evaluation: PACU Anesthesia Type: MAC Level of consciousness: awake and alert Pain management: pain level controlled Vital Signs Assessment: post-procedure vital signs reviewed and stable Respiratory status: spontaneous breathing, nonlabored ventilation, respiratory function stable and patient connected to nasal cannula oxygen Cardiovascular status: stable and blood pressure returned to baseline Postop Assessment: no apparent nausea or vomiting Anesthetic complications: no    Last Vitals:  Vitals:   04/18/18 1150 04/18/18 1200  BP: 108/72 107/75  Pulse: 62 62  Resp: 14 17  Temp:    SpO2: 96% 100%    Last Pain:  Vitals:   04/18/18 1139  TempSrc: Oral  PainSc: 0-No pain                 Tiajuana Amass

## 2018-04-18 NOTE — Anesthesia Preprocedure Evaluation (Signed)
Anesthesia Evaluation  Patient identified by MRN, date of birth, ID band Patient awake    Reviewed: Allergy & Precautions, NPO status , Patient's Chart, lab work & pertinent test results  Airway Mallampati: II  TM Distance: >3 FB Neck ROM: Full    Dental   Pulmonary asthma , sleep apnea and Continuous Positive Airway Pressure Ventilation ,    breath sounds clear to auscultation       Cardiovascular + dysrhythmias Atrial Fibrillation  Rhythm:Regular Rate:Normal     Neuro/Psych Depression negative neurological ROS     GI/Hepatic Neg liver ROS, PUD, GERD  ,  Endo/Other  negative endocrine ROS  Renal/GU negative Renal ROS     Musculoskeletal  (+) Arthritis ,   Abdominal   Peds  Hematology negative hematology ROS (+)   Anesthesia Other Findings   Reproductive/Obstetrics                             Anesthesia Physical Anesthesia Plan  ASA: III  Anesthesia Plan: MAC   Post-op Pain Management:    Induction: Intravenous  PONV Risk Score and Plan: 1 and Propofol infusion, Ondansetron and Treatment may vary due to age or medical condition  Airway Management Planned: Natural Airway and Nasal Cannula  Additional Equipment:   Intra-op Plan:   Post-operative Plan:   Informed Consent: I have reviewed the patients History and Physical, chart, labs and discussed the procedure including the risks, benefits and alternatives for the proposed anesthesia with the patient or authorized representative who has indicated his/her understanding and acceptance.     Plan Discussed with: CRNA  Anesthesia Plan Comments:         Anesthesia Quick Evaluation

## 2018-04-18 NOTE — Discharge Instructions (Signed)

## 2018-04-18 NOTE — Op Note (Signed)
[facility] Patient Name: Francisco Padilla Procedure Date: 04/18/2018 MRN: [id number] Attending MD: Ronnette Juniper , MD Date of Birth: 1950/07/30 CSN: [id number] Age: 67 Admit Type: Inpatient Procedure:                Upper GI endoscopy Indications:              Surveillance for malignancy due to personal history                            of Barrett's esophagus, Dysphagia, Status post                            gastric bypass Providers:                Ronnette Juniper, MD, Cleda Daub, RN, Cherylynn Ridges,                            Technician, Karis Juba, CRNA Referring MD:              Medicines:                Monitored Anesthesia Care Complications:            No immediate complications. Estimated blood loss:                            Minimal. Estimated Blood Loss:     Estimated blood loss was minimal. Procedure:                Pre-Anesthesia Assessment:                           - Prior to the procedure, a History and Physical                            was performed, and patient medications and                            allergies were reviewed. The patient's tolerance of                            previous anesthesia was also reviewed. The risks                            and benefits of the procedure and the sedation                            options and risks were discussed with the patient.                            All questions were answered, and informed consent                            was obtained. Prior Anticoagulants: The patient has                            taken Eliquis (  apixaban), last dose was 3 days                            prior to procedure. ASA Grade Assessment: II - A                            patient with mild systemic disease. After reviewing                            the risks and benefits, the patient was deemed in                            satisfactory condition to undergo the procedure.                           After obtaining  informed consent, the endoscope was                            passed under direct vision. Throughout the                            procedure, the patient's blood pressure, pulse, and                            oxygen saturations were monitored continuously. The                            [id number]-H190 ([id number]) Olympus adult endoscope was                            introduced through the mouth, and advanced to the                            second part of duodenum. The upper GI endoscopy was                            accomplished without difficulty. The patient                            tolerated the procedure well. Scope In: Scope Out: Findings:      No endoscopic abnormality was evident in the esophagus to explain the       patient's complaint of dysphagia.      There were esophageal mucosal changes consistent with short-segment       Barrett's esophagus present in the lower third of the esophagus. The       maximum longitudinal extent of these mucosal changes was 1 cm in length.       Mucosa was biopsied with a cold forceps for histology in a targeted       manner at intervals of 1 cm from 36 to 37 cm from the incisors. One       specimen bottle was sent to pathology.      The cardia and gastric fundus were normal on retroflexion.      Evidence of a gastric bypass  was found. A gastric pouch with a 10 cm       length from the GE junction to the gastrojejunal anastomosis was found.       The staple line appeared intact. The gastrojejunal anastomosis was       characterized by healthy appearing mucosa. This was traversed. The       pouch-to-jejunum limb was characterized by healthy appearing mucosa.      The examined jejunum was normal. Impression:               - No endoscopic esophageal abnormality to explain                            patient's dysphagia.                           - Esophageal mucosal changes consistent with                            short-segment Barrett's  esophagus. Biopsied.                           - Gastric bypass with a pouch 10 cm in length and                            intact staple line. Gastrojejunal anastomosis                            characterized by healthy appearing mucosa.                           - Normal examined jejunum. Moderate Sedation:      Patient did not receive moderate sedation for this procedure, but       instead received monitored anesthesia care. Recommendation:           - Patient has a contact number available for                            emergencies. The signs and symptoms of potential                            delayed complications were discussed with the                            patient. Return to normal activities tomorrow.                            Written discharge instructions were provided to the                            patient.                           - Resume regular diet.                           - Resume Eliquis (apixaban) at prior dose tomorrow.                           -  Await pathology results.                           - Aggressive anti reflux measures such as weight                            loss, elevate head end of the bed during sleep,                            avoid or limit caffeinated products and space last                            meal of the day and bedtime by at least 3 hours. Procedure Code(s):        --- Professional ---                           [phone], Esophagogastroduodenoscopy, flexible,                            transoral; with biopsy, single or multiple Diagnosis Code(s):        --- Professional ---                           R13.10, Dysphagia, unspecified                           K22.70, Barrett's esophagus without dysplasia                           Z98.84, Bariatric surgery status CPT copyright 2017 [employer] Association. All rights reserved. The codes documented in this report are preliminary and upon coder review may  be revised to meet  current compliance requirements. Ronnette Juniper, MD 04/18/2018 11:34:28 AM This report has been signed electronically. Number of Addenda: 0

## 2018-04-18 NOTE — Brief Op Note (Signed)
04/18/2018  11:34 AM  PATIENT:  Francisco Padilla  67 y.o. male  PRE-OPERATIVE DIAGNOSIS:  Pharyngoesophageal dysphagia  POST-OPERATIVE DIAGNOSIS:  barretts esophagus  PROCEDURE:  Procedure(s): ESOPHAGOGASTRODUODENOSCOPY (EGD) (N/A) BIOPSY  SURGEON:  Surgeon(s) and Role:    Ronnette Juniper, MD - Primary  PHYSICIAN ASSISTANT:   ASSISTANTS:Patricia Ford,RN, Janie Billups, Tech ANESTHESIA:   none  EBL:  0 mL   BLOOD ADMINISTERED:none  DRAINS: none   LOCAL MEDICATIONS USED:  NONE  SPECIMEN:  Biopsy / Limited Resection  DISPOSITION OF SPECIMEN:  PATHOLOGY  COUNTS:  YES  TOURNIQUET:  * No tourniquets in log *  DICTATION: .Dragon Dictation  PLAN OF CARE: Discharge to home after PACU  PATIENT DISPOSITION:  PACU - hemodynamically stable.   Delay start of Pharmacological VTE agent (>24hrs) due to surgical blood loss or risk of bleeding: yes

## 2018-04-19 ENCOUNTER — Encounter (HOSPITAL_COMMUNITY): Payer: Self-pay | Admitting: Gastroenterology

## 2018-05-28 ENCOUNTER — Ambulatory Visit: Payer: Medicare Other | Admitting: Cardiology

## 2018-06-21 DIAGNOSIS — H40023 Open angle with borderline findings, high risk, bilateral: Secondary | ICD-10-CM | POA: Diagnosis not present

## 2018-06-21 DIAGNOSIS — H527 Unspecified disorder of refraction: Secondary | ICD-10-CM | POA: Diagnosis not present

## 2018-06-21 DIAGNOSIS — H16223 Keratoconjunctivitis sicca, not specified as Sjogren's, bilateral: Secondary | ICD-10-CM | POA: Diagnosis not present

## 2018-06-21 DIAGNOSIS — H2513 Age-related nuclear cataract, bilateral: Secondary | ICD-10-CM | POA: Diagnosis not present

## 2018-06-21 DIAGNOSIS — H1851 Endothelial corneal dystrophy: Secondary | ICD-10-CM | POA: Diagnosis not present

## 2018-07-03 DIAGNOSIS — E669 Obesity, unspecified: Secondary | ICD-10-CM | POA: Diagnosis not present

## 2018-07-03 DIAGNOSIS — M25561 Pain in right knee: Secondary | ICD-10-CM | POA: Diagnosis not present

## 2018-07-03 DIAGNOSIS — M170 Bilateral primary osteoarthritis of knee: Secondary | ICD-10-CM | POA: Diagnosis not present

## 2018-07-03 DIAGNOSIS — I10 Essential (primary) hypertension: Secondary | ICD-10-CM | POA: Diagnosis not present

## 2018-07-03 DIAGNOSIS — I482 Chronic atrial fibrillation, unspecified: Secondary | ICD-10-CM | POA: Diagnosis not present

## 2018-07-03 DIAGNOSIS — F411 Generalized anxiety disorder: Secondary | ICD-10-CM | POA: Diagnosis not present

## 2018-07-26 DIAGNOSIS — F3340 Major depressive disorder, recurrent, in remission, unspecified: Secondary | ICD-10-CM | POA: Diagnosis not present

## 2018-07-26 DIAGNOSIS — I10 Essential (primary) hypertension: Secondary | ICD-10-CM | POA: Diagnosis not present

## 2018-07-26 DIAGNOSIS — F900 Attention-deficit hyperactivity disorder, predominantly inattentive type: Secondary | ICD-10-CM | POA: Diagnosis not present

## 2018-07-26 DIAGNOSIS — I482 Chronic atrial fibrillation, unspecified: Secondary | ICD-10-CM | POA: Diagnosis not present

## 2018-11-06 DIAGNOSIS — I10 Essential (primary) hypertension: Secondary | ICD-10-CM | POA: Diagnosis not present

## 2018-11-06 DIAGNOSIS — F3340 Major depressive disorder, recurrent, in remission, unspecified: Secondary | ICD-10-CM | POA: Diagnosis not present

## 2018-11-06 DIAGNOSIS — I482 Chronic atrial fibrillation, unspecified: Secondary | ICD-10-CM | POA: Diagnosis not present

## 2018-11-06 DIAGNOSIS — F411 Generalized anxiety disorder: Secondary | ICD-10-CM | POA: Diagnosis not present

## 2018-11-15 DIAGNOSIS — F3340 Major depressive disorder, recurrent, in remission, unspecified: Secondary | ICD-10-CM | POA: Diagnosis not present

## 2018-11-15 DIAGNOSIS — I482 Chronic atrial fibrillation, unspecified: Secondary | ICD-10-CM | POA: Diagnosis not present

## 2018-11-15 DIAGNOSIS — I10 Essential (primary) hypertension: Secondary | ICD-10-CM | POA: Diagnosis not present

## 2018-11-15 DIAGNOSIS — E7800 Pure hypercholesterolemia, unspecified: Secondary | ICD-10-CM | POA: Diagnosis not present

## 2018-12-18 DIAGNOSIS — F3340 Major depressive disorder, recurrent, in remission, unspecified: Secondary | ICD-10-CM | POA: Diagnosis not present

## 2018-12-18 DIAGNOSIS — E7800 Pure hypercholesterolemia, unspecified: Secondary | ICD-10-CM | POA: Diagnosis not present

## 2018-12-18 DIAGNOSIS — I482 Chronic atrial fibrillation, unspecified: Secondary | ICD-10-CM | POA: Diagnosis not present

## 2018-12-18 DIAGNOSIS — I10 Essential (primary) hypertension: Secondary | ICD-10-CM | POA: Diagnosis not present

## 2019-01-10 DIAGNOSIS — I482 Chronic atrial fibrillation, unspecified: Secondary | ICD-10-CM | POA: Diagnosis not present

## 2019-01-10 DIAGNOSIS — I10 Essential (primary) hypertension: Secondary | ICD-10-CM | POA: Diagnosis not present

## 2019-01-10 DIAGNOSIS — F3340 Major depressive disorder, recurrent, in remission, unspecified: Secondary | ICD-10-CM | POA: Diagnosis not present

## 2019-01-10 DIAGNOSIS — E7800 Pure hypercholesterolemia, unspecified: Secondary | ICD-10-CM | POA: Diagnosis not present

## 2019-02-20 ENCOUNTER — Ambulatory Visit (INDEPENDENT_AMBULATORY_CARE_PROVIDER_SITE_OTHER): Payer: Medicare Other | Admitting: Podiatry

## 2019-02-20 ENCOUNTER — Other Ambulatory Visit: Payer: Self-pay

## 2019-02-20 DIAGNOSIS — M79674 Pain in right toe(s): Secondary | ICD-10-CM | POA: Diagnosis not present

## 2019-02-20 DIAGNOSIS — G629 Polyneuropathy, unspecified: Secondary | ICD-10-CM | POA: Diagnosis not present

## 2019-02-20 DIAGNOSIS — M79675 Pain in left toe(s): Secondary | ICD-10-CM

## 2019-02-20 DIAGNOSIS — I951 Orthostatic hypotension: Secondary | ICD-10-CM | POA: Insufficient documentation

## 2019-02-20 DIAGNOSIS — B351 Tinea unguium: Secondary | ICD-10-CM

## 2019-02-20 DIAGNOSIS — I959 Hypotension, unspecified: Secondary | ICD-10-CM | POA: Insufficient documentation

## 2019-02-20 MED ORDER — PREGABALIN 75 MG PO CAPS: 75.0000 mg | ORAL_CAPSULE | Freq: Two times a day (BID) | ORAL | 2 refills | Status: DC

## 2019-02-20 NOTE — Progress Notes (Signed)
Subjective:   Patient ID: Francisco Padilla, male   DOB: 68 y.o.   MRN: [id number]   HPI 68 year old male presents the office today for concerns of burning to both of his feet.  This is been ongoing for least 5 to 6 years.  Mostly at nighttime.  He does feel at times he feels unsteady on his feet.  He does have a history of restless leg syndrome that was treated previously.  Has a history of vein stripping with Dr. Donnetta Hutching.  He has seen his primary care physician for the same as well as adequate circulation.  He is also here for his nails be trimmed.  Is causing irritation.  No recent injury to his feet.  He is on Eliquis   Review of Systems  All other systems reviewed and are negative.  Past Medical History:  Diagnosis Date  . ADD (attention deficit disorder)   . Asthma as child  . Asymptomatic gallstones   . Atrial fibrillation ([place])   . Atrial flutter ([place])   . Depression   . DJD (degenerative joint disease) of knee   . Dysrhythmia    Chronic Atrial Fib- seeing Dr. Lenna Sciara [facility]  . H/O peptic ulcer    past history with GI bleed- cauterization done throught scope  . History of kidney stones    past history-no problem now  . Hypercholesterolemia   . Orthostatic hypotension   . Pneumonia as child  . Prediabetes    none now  . Pulmonary nodules   . Sleep apnea    cpap set on 13  . Thrombocytopenia ([place])    pt denies  . Thyromegaly   . Varicose veins     Past Surgical History:  Procedure Laterality Date  . BIOPSY  04/18/2018   Procedure: BIOPSY;  Surgeon: Ronnette Juniper, MD;  Location: [facility];  Service: Gastroenterology;;  . ESOPHAGOGASTRODUODENOSCOPY N/A 04/18/2018   Procedure: ESOPHAGOGASTRODUODENOSCOPY (EGD);  Surgeon: Ronnette Juniper, MD;  Location: [facility];  Service: Gastroenterology;  Laterality: N/A;  . ESOPHAGOGASTRODUODENOSCOPY (EGD) WITH PROPOFOL N/A 01/05/2015   Procedure: ESOPHAGOGASTRODUODENOSCOPY (EGD) WITH PROPOFOL;  Surgeon: Garlan Fair, MD;   Location: [facility];  Service: Endoscopy;  Laterality: N/A;  . GASTRIC ROUX-EN-Y     '08-[name] (weight stable around 302 #  . KNEE ARTHROSCOPY    . TONSILLECTOMY    . ULNAR NERVE TRANSPOSITION Right 15 yrs ago     Current Outpatient Medications:  .  acetaminophen (TYLENOL) 500 MG tablet, Take 500 mg by mouth every 6 (six) hours as needed for moderate pain., Disp: , Rfl:  .  atorvastatin (LIPITOR) 40 MG tablet, Take 40 mg by mouth at bedtime. , Disp: , Rfl:  .  Calcium Citrate-Vitamin D (CALCIUM CITRATE + D PO), Take 1 tablet by mouth daily., Disp: , Rfl:  .  DULoxetine (CYMBALTA) 30 MG capsule, Take by mouth daily., Disp: , Rfl:  .  ELIQUIS 5 MG TABS tablet, Take 5 mg by mouth 2 (two) times daily. , Disp: , Rfl: 0 .  escitalopram (LEXAPRO) 20 MG tablet, Take 30 mg by mouth at bedtime. , Disp: , Rfl:  .  LORazepam (ATIVAN) 0.5 MG tablet, Take 0.5 mg by mouth every 6 (six) hours as needed for anxiety. , Disp: , Rfl: 0 .  Melatonin 10 MG TABS, Take 1 tablet by mouth at bedtime as needed (sleep)., Disp: , Rfl:  .  Multiple Vitamins-Minerals (ONE DAILY MULTIVITAMIN MEN) TABS, Take 1 tablet by mouth every  evening. , Disp: , Rfl:  .  pantoprazole (PROTONIX) 40 MG tablet, Take 80 mg by mouth at bedtime. , Disp: , Rfl:  .  pregabalin (LYRICA) 75 MG capsule, Take 1 capsule (75 mg total) by mouth 2 (two) times daily., Disp: 60 capsule, Rfl: 2 .  zolpidem (AMBIEN) 10 MG tablet, Take 10 mg by mouth at bedtime. , Disp: , Rfl: 0  Allergies  Allergen Reactions  . Aspirin     Scar stomach(internal)  . Benadryl [Diphenhydramine Hcl]     Memory loss  . Nsaids     Scar stomach(internal)  . Oxycodone Hcl     hallucinations         Objective:  Physical Exam  General: AAO x3, NAD  Dermatological: Nails are dystrophic, discolored with yellow-brown discoloration.  He can become irritated inside shoes.  There is no edema, erythema or signs of infection.  Vascular: Dorsalis Pedis artery and  Posterior Tibial artery pedal pulses are 2/4 bilateral with immedate capillary fill time. Pedal hair growth present. No varicosities and no lower extremity edema present bilateral. There is no pain with calf compression, swelling, warmth, erythema.   Neruologic: Sensation is absent with implants monofilament, vibratory sensation.  Becomes present towards the knee.  Musculoskeletal: No gross boney pedal deformities bilateral. No pain, crepitus, or limitation noted with foot and ankle range of motion bilateral. Muscular strength 5/5 in all groups tested bilateral.  Gait: Unassisted, Nonantalgic.       Assessment:  Onychomycosis, on Eliquis; neuropathy     Plan:  -Treatment options discussed including all alternatives, risks, and complications -Etiology of symptoms were discussed -No debrided P01 without complications or bleeding -Discussed their treatment options for neuropathy.  After discussion he elected proceed with oral therapy.  Prescribed Lyrica discussed side effects. -Follow-up 3 months or sooner if needed  Trula Slade DPM

## 2019-02-27 DIAGNOSIS — I482 Chronic atrial fibrillation, unspecified: Secondary | ICD-10-CM | POA: Diagnosis not present

## 2019-02-27 DIAGNOSIS — E7800 Pure hypercholesterolemia, unspecified: Secondary | ICD-10-CM | POA: Diagnosis not present

## 2019-02-27 DIAGNOSIS — F3340 Major depressive disorder, recurrent, in remission, unspecified: Secondary | ICD-10-CM | POA: Diagnosis not present

## 2019-02-27 DIAGNOSIS — I10 Essential (primary) hypertension: Secondary | ICD-10-CM | POA: Diagnosis not present

## 2019-02-27 DIAGNOSIS — F331 Major depressive disorder, recurrent, moderate: Secondary | ICD-10-CM | POA: Diagnosis not present

## 2019-03-05 DIAGNOSIS — G609 Hereditary and idiopathic neuropathy, unspecified: Secondary | ICD-10-CM | POA: Diagnosis not present

## 2019-03-05 DIAGNOSIS — Z79899 Other long term (current) drug therapy: Secondary | ICD-10-CM | POA: Diagnosis not present

## 2019-03-05 DIAGNOSIS — Z125 Encounter for screening for malignant neoplasm of prostate: Secondary | ICD-10-CM | POA: Diagnosis not present

## 2019-03-05 DIAGNOSIS — I950 Idiopathic hypotension: Secondary | ICD-10-CM | POA: Diagnosis not present

## 2019-03-05 DIAGNOSIS — D6869 Other thrombophilia: Secondary | ICD-10-CM | POA: Diagnosis not present

## 2019-03-05 DIAGNOSIS — E7800 Pure hypercholesterolemia, unspecified: Secondary | ICD-10-CM | POA: Diagnosis not present

## 2019-03-05 DIAGNOSIS — F900 Attention-deficit hyperactivity disorder, predominantly inattentive type: Secondary | ICD-10-CM | POA: Diagnosis not present

## 2019-03-05 DIAGNOSIS — I482 Chronic atrial fibrillation, unspecified: Secondary | ICD-10-CM | POA: Diagnosis not present

## 2019-03-05 DIAGNOSIS — F331 Major depressive disorder, recurrent, moderate: Secondary | ICD-10-CM | POA: Diagnosis not present

## 2019-03-05 DIAGNOSIS — Z0000 Encounter for general adult medical examination without abnormal findings: Secondary | ICD-10-CM | POA: Diagnosis not present

## 2019-03-05 DIAGNOSIS — G4733 Obstructive sleep apnea (adult) (pediatric): Secondary | ICD-10-CM | POA: Diagnosis not present

## 2019-03-12 DIAGNOSIS — Z23 Encounter for immunization: Secondary | ICD-10-CM | POA: Diagnosis not present

## 2019-03-27 DIAGNOSIS — Z713 Dietary counseling and surveillance: Secondary | ICD-10-CM | POA: Diagnosis not present

## 2019-03-27 DIAGNOSIS — Z6830 Body mass index (BMI) 30.0-30.9, adult: Secondary | ICD-10-CM | POA: Diagnosis not present

## 2019-03-27 DIAGNOSIS — E669 Obesity, unspecified: Secondary | ICD-10-CM | POA: Diagnosis not present

## 2019-03-27 DIAGNOSIS — R7303 Prediabetes: Secondary | ICD-10-CM | POA: Diagnosis not present

## 2019-03-27 DIAGNOSIS — Z9884 Bariatric surgery status: Secondary | ICD-10-CM | POA: Diagnosis not present

## 2019-03-27 DIAGNOSIS — Z48815 Encounter for surgical aftercare following surgery on the digestive system: Secondary | ICD-10-CM | POA: Diagnosis not present

## 2019-04-22 DIAGNOSIS — G4733 Obstructive sleep apnea (adult) (pediatric): Secondary | ICD-10-CM | POA: Diagnosis not present

## 2019-05-01 ENCOUNTER — Encounter: Payer: Self-pay | Admitting: Cardiology

## 2019-05-01 ENCOUNTER — Other Ambulatory Visit: Payer: Self-pay

## 2019-05-01 ENCOUNTER — Telehealth (INDEPENDENT_AMBULATORY_CARE_PROVIDER_SITE_OTHER): Payer: Medicare Other | Admitting: Cardiology

## 2019-05-01 VITALS — Ht 76.0 in | Wt 245.0 lb

## 2019-05-01 DIAGNOSIS — M7989 Other specified soft tissue disorders: Secondary | ICD-10-CM

## 2019-05-01 DIAGNOSIS — I482 Chronic atrial fibrillation, unspecified: Secondary | ICD-10-CM

## 2019-05-01 DIAGNOSIS — I951 Orthostatic hypotension: Secondary | ICD-10-CM

## 2019-05-01 DIAGNOSIS — Z7901 Long term (current) use of anticoagulants: Secondary | ICD-10-CM

## 2019-05-01 NOTE — Patient Instructions (Signed)
Medication Instructions:  The current medical regimen is effective;  continue present plan and medications.  If you need a refill on your cardiac medications before your next appointment, please call your pharmacy.   Testing/Procedures: Your physician has requested that you have a lower extremity arterial exercise duplex. During this test, exercise and ultrasound are used to evaluate arterial blood flow in the legs. Allow one hour for this exam. There are no restrictions or special instructions.  This will be completed at out [facility] office on the 2nd floor.  Follow-Up: .Follow up in 6 months  with Dr. Marlou Porch.  You will receive a letter in the mail 2 months before you are due.  Please call us when you receive this letter to schedule your follow up appointment.  Thank you for choosing [facility]!!

## 2019-05-01 NOTE — Progress Notes (Signed)
Patient ID: Francisco Padilla, male   DOB: Jun 17, 1951, 68 y.o.   MRN: [id number]     Cardiology Office Note   Date:  05/01/2019   ID:  [name], DOB [birth year], MRN [id number]  PCP:  Lajean Manes, MD    Wt Readings from Last 3 Encounters:  05/01/19 245 lb (111.1 kg)  04/18/18 290 lb 9.6 oz (131.8 kg)  03/29/18 290 lb 9.6 oz (131.8 kg)       History of Present Illness: Francisco Padilla is a 68 y.o. male  with a hx of PAF, prior GI bleed, obesity s/p gastric bypass in 2008, HL, OSA.  Left leg swelling, chronic, Dr. Donnetta Hutching, vein stripping.  Saw Dr. Thomasene Ripple at [place]. Started Eliquis. Dr. Deno Etienne as well.   He returns for FU.   He is overall feeling better since starting metoprolol. He denies chest pain or significant shortness of breath. He denies syncope or near-syncope. He denies orthopnea or PND. He has chronic left ankle swelling.  No ulcer on EGD from Dr. Wynetta Emery 01/05/15. OK for Eliquis.   He reports issues with anxiety and stress due to his husband's stroke. Francisco Padilla He is the primary caretaker. I saw him several years ago, performed heart cath.   09/03/15-multiple EP consultations. I think that rhythm control would be very challenging for him. Persistent atrial fibrillation. We will go ahead and continue with rate control. No chest pain, no syncope, no bleeding. Tolerating anticoagulation well.  03/22/16-he's been quite tired. Exhausted at times. Discussed how taxing and is once again taking care of his husband [name]. Orthostatics demonstrated decreased blood pressure in the 90s mostly. His blood pressure at rest was 118/78. He states that his mother was very similar. He is only taking low-dose metoprolol 25 mg twice a day. Encouraged salt.  05/24/17  - 3 months syncope, L1 fracture. 3 weeks ago husband broke hip. Afib is bad he states.   -Doing very well currently today.  No shortness of breath.  He does have chronic knee pain especially left.  Left leg swells more because of  inflammation.  03/29/2018  - Off of metoprolol. Syncope 4 times. Increased salt.  Rate is very well controlled without metoprolol 89 bpm.  No changes made.  Francisco Padilla unfortunately had a rough few months from medical standpoint, UTI, underwent circumcision, had thrombocytopenia at one point.  Was then [facility] for a while.  They are both very appreciative of Dr. Felipa Eth.  05/01/19 - here for follow up of AFIB. Francisco Padilla on Eliquis, LV clot, fell, UTI. Dont have a safe place. Non verbal. Good cognitive . 31 yesterday. Waiting for placement. Doing the best he can. More clumsy. ?neuropathy. Working on bike soon.   Past Medical History:  Diagnosis Date  . ADD (attention deficit disorder)   . Asthma as child  . Asymptomatic gallstones   . Atrial fibrillation ([place])   . Atrial flutter ([place])   . Depression   . DJD (degenerative joint disease) of knee   . Dysrhythmia    Chronic Atrial Fib- seeing Dr. Lenna Sciara [facility]  . H/O peptic ulcer    past history with GI bleed- cauterization done throught scope  . History of kidney stones    past history-no problem now  . Hypercholesterolemia   . Orthostatic hypotension   . Pneumonia as child  . Prediabetes    none now  . Pulmonary nodules   . Sleep apnea    cpap set on 13  .  Thrombocytopenia ([place])    pt denies  . Thyromegaly   . Varicose veins     Past Surgical History:  Procedure Laterality Date  . BIOPSY  04/18/2018   Procedure: BIOPSY;  Surgeon: Ronnette Juniper, MD;  [facility];  Service: Gastroenterology;;  . ESOPHAGOGASTRODUODENOSCOPY N/A 04/18/2018   Procedure: ESOPHAGOGASTRODUODENOSCOPY (EGD);  Surgeon: Ronnette Juniper, MD;  Location: [facility];  Service: Gastroenterology;  Laterality: N/A;  . ESOPHAGOGASTRODUODENOSCOPY (EGD) WITH PROPOFOL N/A 01/05/2015   Procedure: ESOPHAGOGASTRODUODENOSCOPY (EGD) WITH PROPOFOL;  Surgeon: Garlan Fair, MD;  Location[facility];  Service: Endoscopy;  Laterality: N/A;  . GASTRIC  ROUX-EN-Y     '08-[name] (weight stable around 302 #  . KNEE ARTHROSCOPY    . TONSILLECTOMY    . ULNAR NERVE TRANSPOSITION Right 15 yrs ago     Current Outpatient Medications  Medication Sig Dispense Refill  . acetaminophen (TYLENOL) 500 MG tablet Take 500 mg by mouth every 6 (six) hours as needed for moderate pain.    Marland Kitchen atorvastatin (LIPITOR) 40 MG tablet Take 20 mg by mouth at bedtime.     . Calcium Citrate-Vitamin D (CALCIUM CITRATE + D PO) Take 1 tablet by mouth daily.    . DULoxetine (CYMBALTA) 30 MG capsule Take by mouth daily.    Marland Kitchen ELIQUIS 5 MG TABS tablet Take 5 mg by mouth 2 (two) times daily.   0  . fludrocortisone (FLORINEF) 0.1 MG tablet Take 100 mcg by mouth daily.    Marland Kitchen LORazepam (ATIVAN) 0.5 MG tablet Take 0.5 mg by mouth every 6 (six) hours as needed for anxiety.   0  . Melatonin 10 MG TABS Take 1 tablet by mouth at bedtime as needed (sleep).    . Multiple Vitamins-Minerals (ONE DAILY MULTIVITAMIN MEN) TABS Take 1 tablet by mouth every evening.     . pantoprazole (PROTONIX) 40 MG tablet Take 80 mg by mouth at bedtime.     . pregabalin (LYRICA) 75 MG capsule Take 1 capsule (75 mg total) by mouth 2 (two) times daily. 60 capsule 2  . zolpidem (AMBIEN) 10 MG tablet Take 10 mg by mouth at bedtime.   0   No current facility-administered medications for this visit.     Allergies:   Aspirin, Benadryl [diphenhydramine hcl], Nsaids, and Oxycodone hcl    Social History:  The patient  reports that he has never smoked. He has never used smokeless tobacco. He reports that he does not drink alcohol or use drugs.   Family History:  The patient's family history includes Heart attack in his father; Heart disease in his father and mother; Heart failure in his mother.    ROS:  Please see the history of present illness.      PHYSICAL EXAM: VS:  Ht 6\' 4"  (1.93 m)   Wt 245 lb (111.1 kg)   BMI 29.82 kg/m  , BMI Body mass index is 29.82 kg/m. Left leg is more edema. Clumsiness. Knee  pain. Alert, pleasant.   Recent Labs: No results found for requested labs within last 8760 hours.   Lipid Panel No results found for: CHOL, TRIG, HDL, CHOLHDL, VLDL, LDLCALC, LDLDIRECT   Other studies Reviewed: Additional studies/ records that were reviewed today with results demonstrating: none.  EKG:  03/29/2018-atrial fibrillation 89 nonspecific ST-T wave changes personally viewed 05/24/17-atrial fibrillation 76 with no other abnormalities.  ASSESSMENT AND PLAN:  1. Atrial fibrillation: Can feel it a little more now that he lost weight.  Permanent atrial fibrillation. Lengthy  discussion again today. We will continue with rate control.  Heart rate is in the 80s without low-dose metoprolol which he did not tolerate because of hypotension.  He has seen [name] as well as Dr. Deno Etienne.  2. Chronic anticoagulation-continue with Eliquis. No bleeding. Still doing well.  3. Anxiety-[facility]ly visits at [facility] have been put on hold because of Francisco Padilla's health status.  Stable. 4. Orthostatic hypotension - occasional.  liberalize salt intake, fluids, pickles for instance. Interestingly, he remembers Dr. Felipa Eth giving his mother the same advice.  No changes.  He is off of metoprolol.  Blood pressure previously 90. Now on Florinef.  We have given him recommendations for compression hose, wholesale and [place] previously. 5. Gastric bypass - [name] 2008, lost 50 pounds. Feels better. Vitamins.  6. Left edema leg - likely dependent. But unilateral. On eliquis. Would be unusual for clotting. Likely arthritis. Cortisone on that leg.  We will check lower extremity Dopplers to be sure given his marked unilateral nature 7. Neuropathy - working on it.  Bike.    Orders Placed This Encounter  Procedures  . VAS [place] LOWER EXTREMITY ARTERIAL DUPLEX    Disposition:   FU in 42months   Signed, Candee Furbish, MD  05/01/2019 5:36 PM    [facility] [place], [place], [place]  [address] Phone: [phone]; Fax: [phone]

## 2019-05-02 ENCOUNTER — Ambulatory Visit
Admission: RE | Admit: 2019-05-02 | Discharge: 2019-05-02 | Disposition: A | Discharge status: Home / Self Care | Payer: Medicare Other | Source: Ambulatory Visit | Attending: Geriatric Medicine | Admitting: Geriatric Medicine

## 2019-05-02 ENCOUNTER — Other Ambulatory Visit: Payer: Self-pay | Admitting: Geriatric Medicine

## 2019-05-02 DIAGNOSIS — F439 Reaction to severe stress, unspecified: Secondary | ICD-10-CM | POA: Diagnosis not present

## 2019-05-02 DIAGNOSIS — R600 Localized edema: Secondary | ICD-10-CM | POA: Diagnosis not present

## 2019-05-02 DIAGNOSIS — I482 Chronic atrial fibrillation, unspecified: Secondary | ICD-10-CM | POA: Diagnosis not present

## 2019-05-02 DIAGNOSIS — D6869 Other thrombophilia: Secondary | ICD-10-CM | POA: Diagnosis not present

## 2019-05-02 DIAGNOSIS — M7989 Other specified soft tissue disorders: Secondary | ICD-10-CM

## 2019-05-02 DIAGNOSIS — I10 Essential (primary) hypertension: Secondary | ICD-10-CM | POA: Diagnosis not present

## 2019-05-02 DIAGNOSIS — G609 Hereditary and idiopathic neuropathy, unspecified: Secondary | ICD-10-CM | POA: Diagnosis not present

## 2019-05-09 ENCOUNTER — Telehealth: Payer: Self-pay | Admitting: *Deleted

## 2019-05-09 DIAGNOSIS — M7989 Other specified soft tissue disorders: Secondary | ICD-10-CM

## 2019-05-09 NOTE — Telephone Encounter (Signed)
Per Dr Marlou Porch.  Pt had been ordered to have upcoming testing for the evaluation of lower extremity edema.  In review of his chart, this testing had been completed 10/9 at [facility] demonstrating "negative for deep venous thrombosis.  Greater saphenous vein containing echogenic material compatible with thrombus.  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 [address] [place] [phone]) for further evaluation and recommendation.  Left message for pt to c/b to discuss.

## 2019-05-09 NOTE — Telephone Encounter (Signed)
New message:     Patient returning your call back. Please call patient.

## 2019-05-09 NOTE — Telephone Encounter (Signed)
Spoke with patient who is aware of the referral to [facility].  Pt prefers Dr Donnetta Hutching since he has seen him in the past.  Advised I will put the referral in and the office will contact him for scheduling.

## 2019-05-13 ENCOUNTER — Encounter (HOSPITAL_COMMUNITY): Payer: Medicare Other

## 2019-05-19 DIAGNOSIS — B009 Herpesviral infection, unspecified: Secondary | ICD-10-CM | POA: Diagnosis not present

## 2019-05-22 ENCOUNTER — Encounter: Payer: Self-pay | Admitting: Podiatry

## 2019-05-22 ENCOUNTER — Other Ambulatory Visit: Payer: Self-pay

## 2019-05-22 ENCOUNTER — Ambulatory Visit (INDEPENDENT_AMBULATORY_CARE_PROVIDER_SITE_OTHER): Payer: Medicare Other | Admitting: Podiatry

## 2019-05-22 DIAGNOSIS — G629 Polyneuropathy, unspecified: Secondary | ICD-10-CM

## 2019-05-22 DIAGNOSIS — M79675 Pain in left toe(s): Secondary | ICD-10-CM

## 2019-05-22 DIAGNOSIS — M79674 Pain in right toe(s): Secondary | ICD-10-CM

## 2019-05-22 DIAGNOSIS — B351 Tinea unguium: Secondary | ICD-10-CM

## 2019-05-23 ENCOUNTER — Telehealth: Payer: Self-pay | Admitting: *Deleted

## 2019-05-23 NOTE — Progress Notes (Signed)
Subjective: 68 y.o. returns the office today for painful, elongated, thickened toenails which hecannot trim himself. Denies any redness or drainage around the nails.  He is interested in the Lyrica but he did not get it because it needs a prior authorization.  Denies any acute changes since last appointment and no new complaints today. Denies any systemic complaints such as fevers, chills, nausea, vomiting.   PCP: Lajean Manes, MD   Objective: AAO 3, NAD DP/PT pulses palpable, CRT less than 3 seconds Nails hypertrophic, dystrophic, elongated, brittle, discolored 10. There is tenderness overlying the nails 1-5 bilaterally. There is no surrounding erythema or drainage along the nail sites. No open lesions or pre-ulcerative lesions are identified. No other areas of tenderness bilateral lower extremities. No overlying edema, erythema, increased warmth. No pain with calf compression, swelling, warmth, erythema.  Assessment: Patient presents with symptomatic onychomycosis  Plan: -Treatment options including alternatives, risks, complications were discussed -Nails sharply debrided 10 without complication/bleeding. -We will try to get the correlation for Lyrica.  Again discussed side effects of this medication.  Discussed we can start taking 1 pill a day and titrate up to 2 if tolerated. -Discussed daily foot inspection. If there are any changes, to call the office immediately.  -Follow-up in 9 weeks or sooner if any problems are to arise. In the meantime, encouraged to call the office with any questions, concerns, changes symptoms.  Celesta Gentile, DPM

## 2019-05-23 NOTE — Telephone Encounter (Signed)
Lattie Haw- do you know anything about this or how I can complete it?

## 2019-05-23 NOTE — Telephone Encounter (Signed)
-----   Message from Trula Slade, DPM sent at 05/23/2019  7:02 AM EDT ----- Patient states that the lyrica I had prescribed at the last visit needed a prior authorization. Do you know anything about this? I just wanted to follow up on it.   Thanks.

## 2019-05-29 NOTE — Telephone Encounter (Signed)
Spoke with the pharmacist at [facility]) and pharmacist stated that Lyrica did not need a prior authorization if it was generic but did need Prior authorization if it was a brand name and stated to [facility] to go ahead and refill the Lyrica and called the patient to let him know. Francisco Padilla

## 2019-06-03 ENCOUNTER — Other Ambulatory Visit: Payer: Self-pay | Admitting: Internal Medicine

## 2019-06-03 ENCOUNTER — Other Ambulatory Visit: Payer: Self-pay | Admitting: Geriatric Medicine

## 2019-06-03 DIAGNOSIS — R1310 Dysphagia, unspecified: Secondary | ICD-10-CM | POA: Diagnosis not present

## 2019-06-03 DIAGNOSIS — R0781 Pleurodynia: Secondary | ICD-10-CM | POA: Diagnosis not present

## 2019-06-03 DIAGNOSIS — D6869 Other thrombophilia: Secondary | ICD-10-CM | POA: Diagnosis not present

## 2019-06-03 DIAGNOSIS — I700 Atherosclerosis of aorta: Secondary | ICD-10-CM | POA: Diagnosis not present

## 2019-06-03 DIAGNOSIS — I482 Chronic atrial fibrillation, unspecified: Secondary | ICD-10-CM | POA: Diagnosis not present

## 2019-06-03 DIAGNOSIS — I10 Essential (primary) hypertension: Secondary | ICD-10-CM | POA: Diagnosis not present

## 2019-06-03 DIAGNOSIS — F411 Generalized anxiety disorder: Secondary | ICD-10-CM | POA: Diagnosis not present

## 2019-06-03 DIAGNOSIS — N200 Calculus of kidney: Secondary | ICD-10-CM | POA: Diagnosis not present

## 2019-06-03 DIAGNOSIS — R1319 Other dysphagia: Secondary | ICD-10-CM

## 2019-06-06 ENCOUNTER — Encounter: Payer: Self-pay | Admitting: Cardiology

## 2019-06-06 ENCOUNTER — Other Ambulatory Visit: Payer: Self-pay

## 2019-06-06 ENCOUNTER — Ambulatory Visit (INDEPENDENT_AMBULATORY_CARE_PROVIDER_SITE_OTHER): Payer: Medicare Other | Admitting: Cardiology

## 2019-06-06 VITALS — BP 96/60 | HR 81 | Ht 76.0 in | Wt 243.0 lb

## 2019-06-06 DIAGNOSIS — I482 Chronic atrial fibrillation, unspecified: Secondary | ICD-10-CM | POA: Diagnosis not present

## 2019-06-06 DIAGNOSIS — M7989 Other specified soft tissue disorders: Secondary | ICD-10-CM

## 2019-06-06 DIAGNOSIS — I951 Orthostatic hypotension: Secondary | ICD-10-CM

## 2019-06-06 DIAGNOSIS — Z7901 Long term (current) use of anticoagulants: Secondary | ICD-10-CM | POA: Diagnosis not present

## 2019-06-06 NOTE — Progress Notes (Signed)
Patient ID: JANELLE HATHORNE, male   DOB: [birth year], 68 y.o.   MRN: [id number]     Cardiology Office Note   Date:  06/06/2019   ID:  ANGIE FAVRET, DOB 14-Aug-1950, MRN [id number]  PCP:  Lajean Manes, MD    Wt Readings from Last 3 Encounters:  06/06/19 243 lb (110.2 kg)  05/01/19 245 lb (111.1 kg)  04/18/18 290 lb 9.6 oz (131.8 kg)       History of Present Illness: BARAA MEADERS is a 68 y.o. male  with a hx of PAF, prior GI bleed, obesity s/p gastric bypass in 2008, HL, OSA.  Left leg swelling, chronic, Dr. Donnetta Hutching, vein stripping.  Saw Dr. Thomasene Ripple at [facility]. Started Eliquis. Dr. Deno Etienne as well.   He returns for FU.   He is overall feeling better since starting metoprolol. He denies chest pain or significant shortness of breath. He denies syncope or near-syncope. He denies orthopnea or PND. He has chronic left ankle swelling.  No ulcer on EGD from Dr. Wynetta Emery 01/05/15. OK for Eliquis.   He reports issues with anxiety and stress due to his husband's stroke. [name] He is the primary caretaker. I saw him several years ago, performed heart cath.   09/03/15-multiple EP consultations. I think that rhythm control would be very challenging for him. Persistent atrial fibrillation. We will go ahead and continue with rate control. No chest pain, no syncope, no bleeding. Tolerating anticoagulation well.  03/22/16-he's been quite tired. Exhausted at times. Discussed how taxing and is once again taking care of his husband [name]. Orthostatics demonstrated decreased blood pressure in the 90s mostly. His blood pressure at rest was 118/78. He states that his mother was very similar. He is only taking low-dose metoprolol 25 mg twice a day. Encouraged salt.  05/24/17  - 3 months syncope, L1 fracture. 3 weeks ago husband broke hip. Afib is bad he states.   -Doing very well currently today.  No shortness of breath.  He does have chronic knee pain especially left.  Left leg swells more because of  inflammation.  03/29/2018  - Off of metoprolol. Syncope 4 times. Increased salt.  Rate is very well controlled without metoprolol 89 bpm.  No changes made.  [name] unfortunately had a rough few months from medical standpoint, UTI, underwent circumcision, had thrombocytopenia at one point.  Was then [facility] for a while.  They are both very appreciative of Dr. Felipa Eth.  06/06/2019-here for permanent atrial fibrillation follow-up.  He is doing well.  Eliquis no bleeding.  [name] unfortunately was in the hospital at [facility] long.  He is also sustained a fall.  He will be seeing me as well in follow-up.   He lost a significant amount of weight, 50 pounds in 2 months using the bariatric diet plan. No syncope with his hypotension.  On low-dose fludrocortisone.    Past Medical History:  Diagnosis Date  . ADD (attention deficit disorder)   . Asthma as child  . Asymptomatic gallstones   . Atrial fibrillation ([place])   . Atrial flutter ([place])   . Depression   . DJD (degenerative joint disease) of knee   . Dysrhythmia    Chronic Atrial Fib- seeing Dr. Lenna Sciara [facility]  . H/O peptic ulcer    past history with GI bleed- cauterization done throught scope  . History of kidney stones    past history-no problem now  . Hypercholesterolemia   . Orthostatic hypotension   . Pneumonia as  child  . Prediabetes    none now  . Pulmonary nodules   . Sleep apnea    cpap set on 13  . Thrombocytopenia ([place])    pt denies  . Thyromegaly   . Varicose veins     Past Surgical History:  Procedure Laterality Date  . BIOPSY  04/18/2018   Procedure: BIOPSY;  Surgeon: Ronnette Juniper, MD;  Location: [facility] ENDOSCOPY;  Service: Gastroenterology;;  . ESOPHAGOGASTRODUODENOSCOPY N/A 04/18/2018   Procedure: ESOPHAGOGASTRODUODENOSCOPY (EGD);  Surgeon: Ronnette Juniper, MD;  Location: [facility];  Service: Gastroenterology;  Laterality: N/A;  . ESOPHAGOGASTRODUODENOSCOPY (EGD) WITH PROPOFOL N/A 01/05/2015   Procedure:  ESOPHAGOGASTRODUODENOSCOPY (EGD) WITH PROPOFOL;  Surgeon: Garlan Fair, MD;  Location: [facility] ENDOSCOPY;  Service: Endoscopy;  Laterality: N/A;  . GASTRIC ROUX-EN-Y     '08-[name] (weight stable around 302 #  . KNEE ARTHROSCOPY    . TONSILLECTOMY    . ULNAR NERVE TRANSPOSITION Right 15 yrs ago     Current Outpatient Medications  Medication Sig Dispense Refill  . acetaminophen (TYLENOL) 500 MG tablet Take 500 mg by mouth every 6 (six) hours as needed for moderate pain.    Marland Kitchen atorvastatin (LIPITOR) 40 MG tablet Take 20 mg by mouth at bedtime.     . Calcium Citrate-Vitamin D (CALCIUM CITRATE + D PO) Take 1 tablet by mouth daily.    . DULoxetine (CYMBALTA) 30 MG capsule Take by mouth daily.    Marland Kitchen ELIQUIS 5 MG TABS tablet Take 5 mg by mouth 2 (two) times daily.   0  . fludrocortisone (FLORINEF) 0.1 MG tablet Take 100 mcg by mouth daily.    Marland Kitchen LORazepam (ATIVAN) 0.5 MG tablet Take 0.5 mg by mouth every 6 (six) hours as needed for anxiety.   0  . Melatonin 10 MG TABS Take 1 tablet by mouth at bedtime as needed (sleep).    . Multiple Vitamins-Minerals (ONE DAILY MULTIVITAMIN MEN) TABS Take 1 tablet by mouth every evening.     . pantoprazole (PROTONIX) 40 MG tablet Take 80 mg by mouth at bedtime.     . pregabalin (LYRICA) 75 MG capsule Take 1 capsule (75 mg total) by mouth 2 (two) times daily. 60 capsule 2  . zolpidem (AMBIEN) 10 MG tablet Take 10 mg by mouth at bedtime.   0   No current facility-administered medications for this visit.     Allergies:   Aspirin, Benadryl [diphenhydramine hcl], Nsaids, and Oxycodone hcl    Social History:  The patient  reports that he has never smoked. He has never used smokeless tobacco. He reports that he does not drink alcohol or use drugs.   Family History:  The patient's family history includes Heart attack in his father; Heart disease in his father and mother; Heart failure in his mother.    ROS:  Please see the history of present illness.      PHYSICAL  EXAM: VS:  BP 96/60   Pulse 81   Ht 6\' 4"  (1.93 m)   Wt 243 lb (110.2 kg)   SpO2 97%   BMI 29.58 kg/m  , BMI Body mass index is 29.58 kg/m. GEN: Well nourished, well developed, in no acute distress  HEENT: normal  Neck: no JVD, carotid bruits, or masses Cardiac: irreg irreg; no murmurs, rubs, or gallops,no edema  Respiratory:  clear to auscultation bilaterally, normal work of breathing GI: soft, nontender, nondistended, + BS MS: no deformity or atrophy  Skin: warm and dry, no rash Neuro:  Alert and Oriented x 3, Strength and sensation are intact Psych: euthymic mood, full affect      Recent Labs: No results found for requested labs within last 8760 hours.   Lipid Panel No results found for: CHOL, TRIG, HDL, CHOLHDL, VLDL, LDLCALC, LDLDIRECT   Other studies Reviewed: Additional studies/ records that were reviewed today with results demonstrating: none.  EKG: Today 06/06/2019-atrial fibrillation 81 no other significant abnormalities.  03/29/2018-atrial fibrillation 89 nonspecific ST-T wave changes personally viewed 05/24/17-atrial fibrillation 76 with no other abnormalities.  ASSESSMENT AND PLAN:  1. Atrial fibrillation: Permanent atrial fibrillation. Lengthy discussion again today. We will continue with rate control.  Heart rate is in the 80s without low-dose metoprolol which he did not tolerate because of hypotension.  Doing well right now.  He has seen [name] as well as Dr. Deno Etienne.  No changes made 2. Chronic anticoagulation-continue with Eliquis. No bleeding. Prior gastric ulcer.  No evidence of bleeding.  Doing well.  Has had endoscopies in the past. 3. Anxiety-lorazepam.  Previous monthly visits at [facility] have been put on hold because of Jerome's health status.  Stable. 4. Orthostatic hypotension -now on low-dose fludrocortisone once a day.  Liberalize salt intake, fluids, pickles for instance. Interestingly, he remembers Dr. Felipa Eth giving his mother the same advice. Blood  pressure today 96/60.   Orders Placed This Encounter  Procedures  . EKG 12-Lead    Disposition:   FU in 16months   Signed, Candee Furbish, MD  06/06/2019 5:23 PM    [facility] [address], [place], [place]  [address] Phone: [phone]; Fax: [phone]

## 2019-06-06 NOTE — Patient Instructions (Signed)
Medication Instructions:  The current medical regimen is effective;  continue present plan and medications.  *If you need a refill on your cardiac medications before your next appointment, please call your pharmacy*  Follow-Up: At [facility], you and your health needs are our priority.  As part of our continuing mission to provide you with exceptional heart care, we have created designated Provider Care Teams.  These Care Teams include your primary Cardiologist (physician) and Advanced Practice Providers (APPs -  Physician Assistants and Nurse Practitioners) who all work together to provide you with the care you need, when you need it.  Your next appointment:   12 month(s)  The format for your next appointment:   In Person  Provider:   Mark Skains, MD   Thank you for choosing [facility]!!     

## 2019-06-11 ENCOUNTER — Other Ambulatory Visit: Payer: Medicare Other

## 2019-06-13 DIAGNOSIS — E7800 Pure hypercholesterolemia, unspecified: Secondary | ICD-10-CM | POA: Diagnosis not present

## 2019-06-13 DIAGNOSIS — I482 Chronic atrial fibrillation, unspecified: Secondary | ICD-10-CM | POA: Diagnosis not present

## 2019-06-13 DIAGNOSIS — F331 Major depressive disorder, recurrent, moderate: Secondary | ICD-10-CM | POA: Diagnosis not present

## 2019-06-13 DIAGNOSIS — F3340 Major depressive disorder, recurrent, in remission, unspecified: Secondary | ICD-10-CM | POA: Diagnosis not present

## 2019-06-13 DIAGNOSIS — I10 Essential (primary) hypertension: Secondary | ICD-10-CM | POA: Diagnosis not present

## 2019-06-16 ENCOUNTER — Ambulatory Visit
Admission: RE | Admit: 2019-06-16 | Discharge: 2019-06-16 | Disposition: A | Discharge status: Home / Self Care | Payer: Medicare Other | Source: Ambulatory Visit | Attending: Geriatric Medicine | Admitting: Geriatric Medicine

## 2019-06-16 DIAGNOSIS — R1319 Other dysphagia: Secondary | ICD-10-CM

## 2019-06-16 DIAGNOSIS — R1310 Dysphagia, unspecified: Secondary | ICD-10-CM

## 2019-06-26 DIAGNOSIS — H2513 Age-related nuclear cataract, bilateral: Secondary | ICD-10-CM | POA: Diagnosis not present

## 2019-06-26 DIAGNOSIS — H18513 Endothelial corneal dystrophy, bilateral: Secondary | ICD-10-CM | POA: Diagnosis not present

## 2019-06-26 DIAGNOSIS — H16223 Keratoconjunctivitis sicca, not specified as Sjogren's, bilateral: Secondary | ICD-10-CM | POA: Diagnosis not present

## 2019-06-26 DIAGNOSIS — H40023 Open angle with borderline findings, high risk, bilateral: Secondary | ICD-10-CM | POA: Diagnosis not present

## 2019-06-26 DIAGNOSIS — H5213 Myopia, bilateral: Secondary | ICD-10-CM | POA: Diagnosis not present

## 2019-06-27 ENCOUNTER — Other Ambulatory Visit: Payer: Self-pay

## 2019-06-27 DIAGNOSIS — R600 Localized edema: Secondary | ICD-10-CM

## 2019-07-01 ENCOUNTER — Ambulatory Visit (HOSPITAL_COMMUNITY)
Admission: RE | Admit: 2019-07-01 | Discharge: 2019-07-01 | Disposition: A | Discharge status: Home / Self Care | Payer: Medicare Other | Source: Ambulatory Visit | Attending: Family | Admitting: Family

## 2019-07-01 ENCOUNTER — Ambulatory Visit (INDEPENDENT_AMBULATORY_CARE_PROVIDER_SITE_OTHER): Payer: Medicare Other | Admitting: Vascular Surgery

## 2019-07-01 ENCOUNTER — Encounter: Payer: Self-pay | Admitting: Vascular Surgery

## 2019-07-01 ENCOUNTER — Other Ambulatory Visit: Payer: Self-pay

## 2019-07-01 VITALS — BP 126/85 | HR 90 | Temp 98.0°F | Resp 20 | Ht 76.0 in | Wt 244.1 lb

## 2019-07-01 DIAGNOSIS — R600 Localized edema: Secondary | ICD-10-CM | POA: Insufficient documentation

## 2019-07-01 DIAGNOSIS — I83893 Varicose veins of bilateral lower extremities with other complications: Secondary | ICD-10-CM

## 2019-07-01 NOTE — Progress Notes (Signed)
Vascular and Vein Specialist of [facility]  Patient name: Francisco Padilla MRN: [id number] DOB: July 22, 1951 Sex: male  REASON FOR CONSULT: Evaluation swelling and discomfort left greater than right leg  HPI: Francisco Padilla is a 68 y.o. male, who is here today for evaluation of venous pathology.  He is known to me from staged bilateral great saphenous vein ablation and tributary varicosity stab phlebectomy 10-1/2 years ago.  He reports that over the last several years he has had progressive swelling in his left leg.  He is here today for further follow-up.  He reports that he has gained back a great deal of weight.  He had undergone prior gastric bypass for obesity.  He reports that he got back on his appropriate bariatric diet and has lost weight which is made the swelling some better.  He still does have swelling in his left with minimal in his right leg.  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  Past Medical History:  Diagnosis Date   ADD (attention deficit disorder)    Asthma as child   Asymptomatic gallstones    Atrial fibrillation (H[facility])    Atrial flutter (H[facility])    Depression    DJD (degenerative joint disease) of knee    Dysrhythmia    Chronic Atrial Fib- seeing Dr. Lenna Sciara [facility]   H/O peptic ulcer    past history with GI bleed- cauterization done throught scope   History of kidney stones    past history-no problem now   Hypercholesterolemia    Orthostatic hypotension    Pneumonia as child   Prediabetes    none now   Pulmonary nodules    Sleep apnea    cpap set on 13   Thrombocytopenia (HCC)    pt denies   Thyromegaly    Varicose veins     Family History  Problem Relation Age of Onset   Heart disease Mother    Heart failure Mother    Heart disease Father    Heart attack Father    Stroke  Neg Hx     SOCIAL HISTORY: Social History   Socioeconomic History   Marital status: Single    Spouse name: Not on file   Number of children: Not on file   Years of education: Not on file   Highest education level: Not on file  Occupational History   Not on file  Social Needs   Financial resource strain: Not on file   Food insecurity    Worry: Not on file    Inability: Not on file   Transportation needs    Medical: Not on file    Non-medical: Not on file  Tobacco Use   Smoking status: Never Smoker   Smokeless tobacco: Never Used  Substance and Sexual Activity   Alcohol use: No    Alcohol/week: 0.0 standard drinks   Drug use: No   Sexual activity: Yes  Lifestyle   Physical activity    Days per week: Not on file    Minutes per session: Not on file   Stress: Not on file  Relationships   Social connections    Talks on phone: Not on file    Gets together: Not on file    Attends religious service: Not on file    Active member of club or organization: Not on file  Attends meetings of clubs or organizations: Not on file    Relationship status: Not on file   Intimate partner violence    Fear of current or ex partner: Not on file    Emotionally abused: Not on file    Physically abused: Not on file    Forced sexual activity: Not on file  Other Topics Concern   Not on file  Social History Narrative   Not on file    Allergies  Allergen Reactions   Aspirin     Scar stomach(internal)   Benadryl [Diphenhydramine Hcl]     Memory loss   Nsaids     Scar stomach(internal)   Oxycodone Hcl     hallucinations    Current Outpatient Medications  Medication Sig Dispense Refill   acetaminophen (TYLENOL) 500 MG tablet Take 500 mg by mouth every 6 (six) hours as needed for moderate pain.     atorvastatin (LIPITOR) 40 MG tablet Take 20 mg by mouth at bedtime.      Calcium Citrate-Vitamin D (CALCIUM CITRATE + D PO) Take 1 tablet by mouth daily.      DULoxetine (CYMBALTA) 30 MG capsule Take by mouth daily.     ELIQUIS 5 MG TABS tablet Take 5 mg by mouth 2 (two) times daily.   0   fludrocortisone (FLORINEF) 0.1 MG tablet Take 100 mcg by mouth daily.     LORazepam (ATIVAN) 0.5 MG tablet Take 0.5 mg by mouth every 6 (six) hours as needed for anxiety.   0   Melatonin 10 MG TABS Take 1 tablet by mouth at bedtime as needed (sleep).     Multiple Vitamins-Minerals (ONE DAILY MULTIVITAMIN MEN) TABS Take 1 tablet by mouth every evening.      pantoprazole (PROTONIX) 40 MG tablet Take 80 mg by mouth at bedtime.      pregabalin (LYRICA) 75 MG capsule Take 1 capsule (75 mg total) by mouth 2 (two) times daily. 60 capsule 2   zolpidem (AMBIEN) 10 MG tablet Take 10 mg by mouth at bedtime.   0   No current facility-administered medications for this visit.     REVIEW OF SYSTEMS:  [X]  denotes positive finding, [ ]  denotes negative finding Cardiac  Comments:  Chest pain or chest pressure:    Shortness of breath upon exertion:    Short of breath when lying flat:    Irregular heart rhythm: xx       Vascular    Pain in calf, thigh, or hip brought on by ambulation:    Pain in feet at night that wakes you up from your sleep:     Blood clot in your veins:    Leg swelling:  x       Pulmonary    Oxygen at home:    Productive cough:     Wheezing:         Neurologic    Sudden weakness in arms or legs:     Sudden numbness in arms or legs:     Sudden onset of difficulty speaking or slurred speech:    Temporary loss of vision in one eye:     Problems with dizziness:         Gastrointestinal    Blood in stool:     Vomited blood:         Genitourinary    Burning when urinating:     Blood in urine:        Psychiatric    Major  depression:         Hematologic    Bleeding problems:    Problems with blood clotting too easily:        Skin    Rashes or ulcers:        Constitutional    Fever or chills:      PHYSICAL EXAM: Vitals:    07/01/19 1408  BP: 126/85  Pulse: 90  Resp: 20  Temp: 98 F (36.7 C)  SpO2: 99%  Weight: 244 lb 1.6 oz (110.7 kg)  Height: 6\' 4"  (1.93 m)    GENERAL: The patient is a well-nourished male, in no acute distress. The vital signs are documented above. CARDIOVASCULAR: 2+ dorsalis pedis pulses bilaterally.  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 NEUROLOGIC: No focal weakness or paresthesias are detected. SKIN: There are no ulcers or rashes noted. PSYCHIATRIC: The patient has a normal affect.  DATA:  Reflux study today reveals closure of his right great saphenous vein.  He has a large anterior accessory great saphenous vein arising from his common femoral vein which is leading into the varicosities in his right thigh.  On his left leg he has had recanalization of his prior treated great saphenous vein.  This is quite enlarged with diameters up to 1 cm.  MEDICAL ISSUES: Had long discussion with the patient regarding these findings.  He has not been wearing compression garments.  We have fitted him today with thigh-high graduated compression garments for his left leg and instructed him on elevation as well.  We will see him again in 3 months.  He would be a candidate for ablation of his recanalized great saphenous vein.  I imaged this with SonoSite and there is some webbing but does appear that though it is patent and a laser fiber appears as though it should pass from below his knee to his saphenofemoral junction.  He will see Dr. Scot Dock or fields in 3 months for continued discussion   Rosetta Posner, MD [facility] and Ve[facility] Specialists of [facility] Tel [phone] Pager [phone]

## 2019-07-02 DIAGNOSIS — E669 Obesity, unspecified: Secondary | ICD-10-CM | POA: Diagnosis not present

## 2019-07-02 DIAGNOSIS — Z713 Dietary counseling and surveillance: Secondary | ICD-10-CM | POA: Diagnosis not present

## 2019-07-02 DIAGNOSIS — Z48815 Encounter for surgical aftercare following surgery on the digestive system: Secondary | ICD-10-CM | POA: Diagnosis not present

## 2019-07-02 DIAGNOSIS — R7303 Prediabetes: Secondary | ICD-10-CM | POA: Diagnosis not present

## 2019-07-02 DIAGNOSIS — Z9884 Bariatric surgery status: Secondary | ICD-10-CM | POA: Diagnosis not present

## 2019-07-24 DIAGNOSIS — I482 Chronic atrial fibrillation, unspecified: Secondary | ICD-10-CM | POA: Diagnosis not present

## 2019-07-24 DIAGNOSIS — I10 Essential (primary) hypertension: Secondary | ICD-10-CM | POA: Diagnosis not present

## 2019-07-24 DIAGNOSIS — E7800 Pure hypercholesterolemia, unspecified: Secondary | ICD-10-CM | POA: Diagnosis not present

## 2019-07-24 DIAGNOSIS — F331 Major depressive disorder, recurrent, moderate: Secondary | ICD-10-CM | POA: Diagnosis not present

## 2019-07-24 DIAGNOSIS — F3340 Major depressive disorder, recurrent, in remission, unspecified: Secondary | ICD-10-CM | POA: Diagnosis not present

## 2019-07-31 ENCOUNTER — Other Ambulatory Visit: Payer: Self-pay

## 2019-07-31 ENCOUNTER — Ambulatory Visit (INDEPENDENT_AMBULATORY_CARE_PROVIDER_SITE_OTHER): Payer: Medicare Other | Admitting: Podiatry

## 2019-07-31 DIAGNOSIS — G629 Polyneuropathy, unspecified: Secondary | ICD-10-CM | POA: Diagnosis not present

## 2019-07-31 DIAGNOSIS — M79675 Pain in left toe(s): Secondary | ICD-10-CM

## 2019-07-31 DIAGNOSIS — M79674 Pain in right toe(s): Secondary | ICD-10-CM

## 2019-07-31 DIAGNOSIS — B351 Tinea unguium: Secondary | ICD-10-CM | POA: Diagnosis not present

## 2019-07-31 NOTE — Progress Notes (Signed)
Subjective: 69 y.o. returns the office today for painful, elongated, thickened toenails which hecannot trim himself. Denies any redness or drainage around the nails.  Has been taking Lyrica 1 tablet at nighttime without any side effects. Denies any acute changes since last appointment and no new complaints today. Denies any systemic complaints such as fevers, chills, nausea, vomiting.   PCP: Lajean Manes, MD   Objective: AAO 3, NAD DP/PT pulses palpable, CRT less than 3 seconds Nails hypertrophic, dystrophic, elongated, brittle, discolored 10. There is tenderness overlying the nails 1-5 bilaterally. There is no surrounding erythema or drainage along the nail sites. No open lesions or pre-ulcerative lesions are identified. No other areas of tenderness bilateral lower extremities. No overlying edema, erythema, increased warmth. No pain with calf compression, swelling, warmth, erythema.  Assessment: Patient presents with symptomatic onychomycosis  Plan: -Treatment options including alternatives, risks, complications were discussed -Nails sharply debrided 10 without complication/bleeding. -Continue Lyrica.  He is taking 1 tablet at nighttime and doing well with this.  Continue current dose. -Discussed daily foot inspection. If there are any changes, to call the office immediately.  -Follow-up in 9 weeks or sooner if any problems are to arise. In the meantime, encouraged to call the office with any questions, concerns, changes symptoms.  Celesta Gentile, DPM

## 2019-08-18 DIAGNOSIS — B009 Herpesviral infection, unspecified: Secondary | ICD-10-CM | POA: Diagnosis not present

## 2019-09-15 DIAGNOSIS — N522 Drug-induced erectile dysfunction: Secondary | ICD-10-CM | POA: Diagnosis not present

## 2019-09-15 DIAGNOSIS — N209 Urinary calculus, unspecified: Secondary | ICD-10-CM | POA: Diagnosis not present

## 2019-09-16 DIAGNOSIS — I482 Chronic atrial fibrillation, unspecified: Secondary | ICD-10-CM | POA: Diagnosis not present

## 2019-09-16 DIAGNOSIS — F331 Major depressive disorder, recurrent, moderate: Secondary | ICD-10-CM | POA: Diagnosis not present

## 2019-09-16 DIAGNOSIS — E7800 Pure hypercholesterolemia, unspecified: Secondary | ICD-10-CM | POA: Diagnosis not present

## 2019-09-16 DIAGNOSIS — I10 Essential (primary) hypertension: Secondary | ICD-10-CM | POA: Diagnosis not present

## 2019-09-16 DIAGNOSIS — F3340 Major depressive disorder, recurrent, in remission, unspecified: Secondary | ICD-10-CM | POA: Diagnosis not present

## 2019-09-29 ENCOUNTER — Ambulatory Visit (INDEPENDENT_AMBULATORY_CARE_PROVIDER_SITE_OTHER): Payer: Medicare Other | Admitting: Podiatry

## 2019-09-29 ENCOUNTER — Other Ambulatory Visit: Payer: Self-pay

## 2019-09-29 DIAGNOSIS — M79674 Pain in right toe(s): Secondary | ICD-10-CM

## 2019-09-29 DIAGNOSIS — B351 Tinea unguium: Secondary | ICD-10-CM

## 2019-09-29 DIAGNOSIS — M79675 Pain in left toe(s): Secondary | ICD-10-CM | POA: Diagnosis not present

## 2019-09-29 DIAGNOSIS — G629 Polyneuropathy, unspecified: Secondary | ICD-10-CM

## 2019-09-29 NOTE — Progress Notes (Signed)
Subjective: 70 y.o. returns the office today for painful, elongated, thickened toenails which hecannot trim himself.  Overall doing well.  Some some issues with neuropathy but wants to hold off on any further Lyrica changes.  Denies any systemic complaints such as fevers, chills, nausea, vomiting.   PCP: Lajean Manes, MD   Objective: AAO 3, NAD DP/PT pulses palpable, CRT less than 3 seconds Nails hypertrophic, dystrophic, elongated, brittle, discolored 10. There is tenderness overlying the nails 1-5 bilaterally. There is no surrounding erythema or drainage along the nail sites. No pain with calf compression, swelling, warmth, erythema.  Assessment: Patient presents with symptomatic onychomycosis  Plan: -Treatment options including alternatives, risks, complications were discussed -Nails sharply debrided 10 without complication/bleeding. -Discussed daily foot inspection. If there are any changes, to call the office immediately.  -Follow-up in 9 weeks or sooner if any problems are to arise. In the meantime, encouraged to call the office with any questions, concerns, changes symptoms.  Celesta Gentile, DPM

## 2019-10-01 DIAGNOSIS — I482 Chronic atrial fibrillation, unspecified: Secondary | ICD-10-CM | POA: Diagnosis not present

## 2019-10-01 DIAGNOSIS — F900 Attention-deficit hyperactivity disorder, predominantly inattentive type: Secondary | ICD-10-CM | POA: Diagnosis not present

## 2019-10-01 DIAGNOSIS — D6869 Other thrombophilia: Secondary | ICD-10-CM | POA: Diagnosis not present

## 2019-10-01 DIAGNOSIS — I700 Atherosclerosis of aorta: Secondary | ICD-10-CM | POA: Diagnosis not present

## 2019-10-01 DIAGNOSIS — I10 Essential (primary) hypertension: Secondary | ICD-10-CM | POA: Diagnosis not present

## 2019-10-08 ENCOUNTER — Ambulatory Visit: Payer: Medicare Other | Admitting: Vascular Surgery

## 2019-10-16 DIAGNOSIS — F332 Major depressive disorder, recurrent severe without psychotic features: Secondary | ICD-10-CM | POA: Diagnosis not present

## 2019-10-16 DIAGNOSIS — F410 Panic disorder [episodic paroxysmal anxiety] without agoraphobia: Secondary | ICD-10-CM | POA: Diagnosis not present

## 2019-10-16 DIAGNOSIS — F902 Attention-deficit hyperactivity disorder, combined type: Secondary | ICD-10-CM | POA: Diagnosis not present

## 2019-10-16 DIAGNOSIS — F411 Generalized anxiety disorder: Secondary | ICD-10-CM | POA: Diagnosis not present

## 2019-10-22 DIAGNOSIS — I10 Essential (primary) hypertension: Secondary | ICD-10-CM | POA: Diagnosis not present

## 2019-10-22 DIAGNOSIS — E7800 Pure hypercholesterolemia, unspecified: Secondary | ICD-10-CM | POA: Diagnosis not present

## 2019-10-22 DIAGNOSIS — F3340 Major depressive disorder, recurrent, in remission, unspecified: Secondary | ICD-10-CM | POA: Diagnosis not present

## 2019-10-22 DIAGNOSIS — F331 Major depressive disorder, recurrent, moderate: Secondary | ICD-10-CM | POA: Diagnosis not present

## 2019-10-22 DIAGNOSIS — I482 Chronic atrial fibrillation, unspecified: Secondary | ICD-10-CM | POA: Diagnosis not present

## 2019-10-24 ENCOUNTER — Ambulatory Visit: Payer: Medicare Other | Admitting: Vascular Surgery

## 2019-10-30 DIAGNOSIS — F410 Panic disorder [episodic paroxysmal anxiety] without agoraphobia: Secondary | ICD-10-CM | POA: Diagnosis not present

## 2019-10-30 DIAGNOSIS — F332 Major depressive disorder, recurrent severe without psychotic features: Secondary | ICD-10-CM | POA: Diagnosis not present

## 2019-10-30 DIAGNOSIS — F902 Attention-deficit hyperactivity disorder, combined type: Secondary | ICD-10-CM | POA: Diagnosis not present

## 2019-10-30 DIAGNOSIS — F411 Generalized anxiety disorder: Secondary | ICD-10-CM | POA: Diagnosis not present

## 2019-11-10 DIAGNOSIS — D6869 Other thrombophilia: Secondary | ICD-10-CM | POA: Diagnosis not present

## 2019-11-10 DIAGNOSIS — I10 Essential (primary) hypertension: Secondary | ICD-10-CM | POA: Diagnosis not present

## 2019-11-10 DIAGNOSIS — F411 Generalized anxiety disorder: Secondary | ICD-10-CM | POA: Diagnosis not present

## 2019-11-10 DIAGNOSIS — F900 Attention-deficit hyperactivity disorder, predominantly inattentive type: Secondary | ICD-10-CM | POA: Diagnosis not present

## 2019-11-10 DIAGNOSIS — I482 Chronic atrial fibrillation, unspecified: Secondary | ICD-10-CM | POA: Diagnosis not present

## 2019-11-13 DIAGNOSIS — F411 Generalized anxiety disorder: Secondary | ICD-10-CM | POA: Diagnosis not present

## 2019-11-13 DIAGNOSIS — F902 Attention-deficit hyperactivity disorder, combined type: Secondary | ICD-10-CM | POA: Diagnosis not present

## 2019-11-13 DIAGNOSIS — F332 Major depressive disorder, recurrent severe without psychotic features: Secondary | ICD-10-CM | POA: Diagnosis not present

## 2019-11-13 DIAGNOSIS — F410 Panic disorder [episodic paroxysmal anxiety] without agoraphobia: Secondary | ICD-10-CM | POA: Diagnosis not present

## 2019-11-18 DIAGNOSIS — I10 Essential (primary) hypertension: Secondary | ICD-10-CM | POA: Diagnosis not present

## 2019-11-18 DIAGNOSIS — I482 Chronic atrial fibrillation, unspecified: Secondary | ICD-10-CM | POA: Diagnosis not present

## 2019-11-18 DIAGNOSIS — E7800 Pure hypercholesterolemia, unspecified: Secondary | ICD-10-CM | POA: Diagnosis not present

## 2019-11-18 DIAGNOSIS — F331 Major depressive disorder, recurrent, moderate: Secondary | ICD-10-CM | POA: Diagnosis not present

## 2019-11-18 DIAGNOSIS — F3340 Major depressive disorder, recurrent, in remission, unspecified: Secondary | ICD-10-CM | POA: Diagnosis not present

## 2019-11-27 DIAGNOSIS — F332 Major depressive disorder, recurrent severe without psychotic features: Secondary | ICD-10-CM | POA: Diagnosis not present

## 2019-11-27 DIAGNOSIS — F410 Panic disorder [episodic paroxysmal anxiety] without agoraphobia: Secondary | ICD-10-CM | POA: Diagnosis not present

## 2019-11-27 DIAGNOSIS — F902 Attention-deficit hyperactivity disorder, combined type: Secondary | ICD-10-CM | POA: Diagnosis not present

## 2019-11-27 DIAGNOSIS — F411 Generalized anxiety disorder: Secondary | ICD-10-CM | POA: Diagnosis not present

## 2019-12-01 ENCOUNTER — Ambulatory Visit: Payer: Medicare Other | Admitting: Podiatry

## 2019-12-02 DIAGNOSIS — R7303 Prediabetes: Secondary | ICD-10-CM | POA: Diagnosis not present

## 2019-12-03 DIAGNOSIS — F411 Generalized anxiety disorder: Secondary | ICD-10-CM | POA: Diagnosis not present

## 2019-12-03 DIAGNOSIS — F902 Attention-deficit hyperactivity disorder, combined type: Secondary | ICD-10-CM | POA: Diagnosis not present

## 2019-12-03 DIAGNOSIS — F410 Panic disorder [episodic paroxysmal anxiety] without agoraphobia: Secondary | ICD-10-CM | POA: Diagnosis not present

## 2019-12-03 DIAGNOSIS — F332 Major depressive disorder, recurrent severe without psychotic features: Secondary | ICD-10-CM | POA: Diagnosis not present

## 2019-12-10 ENCOUNTER — Ambulatory Visit: Payer: Medicare Other | Admitting: Vascular Surgery

## 2019-12-11 DIAGNOSIS — F902 Attention-deficit hyperactivity disorder, combined type: Secondary | ICD-10-CM | POA: Diagnosis not present

## 2019-12-11 DIAGNOSIS — F410 Panic disorder [episodic paroxysmal anxiety] without agoraphobia: Secondary | ICD-10-CM | POA: Diagnosis not present

## 2019-12-11 DIAGNOSIS — F411 Generalized anxiety disorder: Secondary | ICD-10-CM | POA: Diagnosis not present

## 2019-12-11 DIAGNOSIS — F332 Major depressive disorder, recurrent severe without psychotic features: Secondary | ICD-10-CM | POA: Diagnosis not present

## 2019-12-12 DIAGNOSIS — F331 Major depressive disorder, recurrent, moderate: Secondary | ICD-10-CM | POA: Diagnosis not present

## 2019-12-12 DIAGNOSIS — I10 Essential (primary) hypertension: Secondary | ICD-10-CM | POA: Diagnosis not present

## 2019-12-12 DIAGNOSIS — F3340 Major depressive disorder, recurrent, in remission, unspecified: Secondary | ICD-10-CM | POA: Diagnosis not present

## 2019-12-12 DIAGNOSIS — E7800 Pure hypercholesterolemia, unspecified: Secondary | ICD-10-CM | POA: Diagnosis not present

## 2019-12-12 DIAGNOSIS — I482 Chronic atrial fibrillation, unspecified: Secondary | ICD-10-CM | POA: Diagnosis not present

## 2019-12-18 DIAGNOSIS — F902 Attention-deficit hyperactivity disorder, combined type: Secondary | ICD-10-CM | POA: Diagnosis not present

## 2019-12-18 DIAGNOSIS — F332 Major depressive disorder, recurrent severe without psychotic features: Secondary | ICD-10-CM | POA: Diagnosis not present

## 2019-12-18 DIAGNOSIS — F410 Panic disorder [episodic paroxysmal anxiety] without agoraphobia: Secondary | ICD-10-CM | POA: Diagnosis not present

## 2019-12-18 DIAGNOSIS — F411 Generalized anxiety disorder: Secondary | ICD-10-CM | POA: Diagnosis not present

## 2019-12-25 DIAGNOSIS — F902 Attention-deficit hyperactivity disorder, combined type: Secondary | ICD-10-CM | POA: Diagnosis not present

## 2019-12-25 DIAGNOSIS — F410 Panic disorder [episodic paroxysmal anxiety] without agoraphobia: Secondary | ICD-10-CM | POA: Diagnosis not present

## 2019-12-25 DIAGNOSIS — F411 Generalized anxiety disorder: Secondary | ICD-10-CM | POA: Diagnosis not present

## 2019-12-25 DIAGNOSIS — F332 Major depressive disorder, recurrent severe without psychotic features: Secondary | ICD-10-CM | POA: Diagnosis not present

## 2020-01-01 DIAGNOSIS — F410 Panic disorder [episodic paroxysmal anxiety] without agoraphobia: Secondary | ICD-10-CM | POA: Diagnosis not present

## 2020-01-01 DIAGNOSIS — F902 Attention-deficit hyperactivity disorder, combined type: Secondary | ICD-10-CM | POA: Diagnosis not present

## 2020-01-01 DIAGNOSIS — F332 Major depressive disorder, recurrent severe without psychotic features: Secondary | ICD-10-CM | POA: Diagnosis not present

## 2020-01-01 DIAGNOSIS — F411 Generalized anxiety disorder: Secondary | ICD-10-CM | POA: Diagnosis not present

## 2020-01-06 DIAGNOSIS — F3340 Major depressive disorder, recurrent, in remission, unspecified: Secondary | ICD-10-CM | POA: Diagnosis not present

## 2020-01-06 DIAGNOSIS — F331 Major depressive disorder, recurrent, moderate: Secondary | ICD-10-CM | POA: Diagnosis not present

## 2020-01-06 DIAGNOSIS — I10 Essential (primary) hypertension: Secondary | ICD-10-CM | POA: Diagnosis not present

## 2020-01-06 DIAGNOSIS — E7800 Pure hypercholesterolemia, unspecified: Secondary | ICD-10-CM | POA: Diagnosis not present

## 2020-01-06 DIAGNOSIS — I482 Chronic atrial fibrillation, unspecified: Secondary | ICD-10-CM | POA: Diagnosis not present

## 2020-01-06 DIAGNOSIS — D6869 Other thrombophilia: Secondary | ICD-10-CM | POA: Diagnosis not present

## 2020-02-05 DIAGNOSIS — F902 Attention-deficit hyperactivity disorder, combined type: Secondary | ICD-10-CM | POA: Diagnosis not present

## 2020-02-05 DIAGNOSIS — F410 Panic disorder [episodic paroxysmal anxiety] without agoraphobia: Secondary | ICD-10-CM | POA: Diagnosis not present

## 2020-02-05 DIAGNOSIS — F411 Generalized anxiety disorder: Secondary | ICD-10-CM | POA: Diagnosis not present

## 2020-02-05 DIAGNOSIS — F332 Major depressive disorder, recurrent severe without psychotic features: Secondary | ICD-10-CM | POA: Diagnosis not present

## 2020-02-19 DIAGNOSIS — E7800 Pure hypercholesterolemia, unspecified: Secondary | ICD-10-CM | POA: Diagnosis not present

## 2020-02-19 DIAGNOSIS — F331 Major depressive disorder, recurrent, moderate: Secondary | ICD-10-CM | POA: Diagnosis not present

## 2020-02-19 DIAGNOSIS — I10 Essential (primary) hypertension: Secondary | ICD-10-CM | POA: Diagnosis not present

## 2020-02-19 DIAGNOSIS — F3340 Major depressive disorder, recurrent, in remission, unspecified: Secondary | ICD-10-CM | POA: Diagnosis not present

## 2020-02-19 DIAGNOSIS — I482 Chronic atrial fibrillation, unspecified: Secondary | ICD-10-CM | POA: Diagnosis not present

## 2020-02-23 DIAGNOSIS — F410 Panic disorder [episodic paroxysmal anxiety] without agoraphobia: Secondary | ICD-10-CM | POA: Diagnosis not present

## 2020-02-23 DIAGNOSIS — F411 Generalized anxiety disorder: Secondary | ICD-10-CM | POA: Diagnosis not present

## 2020-02-23 DIAGNOSIS — F902 Attention-deficit hyperactivity disorder, combined type: Secondary | ICD-10-CM | POA: Diagnosis not present

## 2020-02-23 DIAGNOSIS — F332 Major depressive disorder, recurrent severe without psychotic features: Secondary | ICD-10-CM | POA: Diagnosis not present

## 2020-03-04 DIAGNOSIS — F332 Major depressive disorder, recurrent severe without psychotic features: Secondary | ICD-10-CM | POA: Diagnosis not present

## 2020-03-04 DIAGNOSIS — F410 Panic disorder [episodic paroxysmal anxiety] without agoraphobia: Secondary | ICD-10-CM | POA: Diagnosis not present

## 2020-03-04 DIAGNOSIS — F902 Attention-deficit hyperactivity disorder, combined type: Secondary | ICD-10-CM | POA: Diagnosis not present

## 2020-03-04 DIAGNOSIS — F411 Generalized anxiety disorder: Secondary | ICD-10-CM | POA: Diagnosis not present

## 2020-03-10 DIAGNOSIS — I10 Essential (primary) hypertension: Secondary | ICD-10-CM | POA: Diagnosis not present

## 2020-03-10 DIAGNOSIS — I700 Atherosclerosis of aorta: Secondary | ICD-10-CM | POA: Diagnosis not present

## 2020-03-10 DIAGNOSIS — G4733 Obstructive sleep apnea (adult) (pediatric): Secondary | ICD-10-CM | POA: Diagnosis not present

## 2020-03-10 DIAGNOSIS — I350 Nonrheumatic aortic (valve) stenosis: Secondary | ICD-10-CM | POA: Diagnosis not present

## 2020-03-10 DIAGNOSIS — G609 Hereditary and idiopathic neuropathy, unspecified: Secondary | ICD-10-CM | POA: Diagnosis not present

## 2020-03-10 DIAGNOSIS — E7800 Pure hypercholesterolemia, unspecified: Secondary | ICD-10-CM | POA: Diagnosis not present

## 2020-03-10 DIAGNOSIS — F900 Attention-deficit hyperactivity disorder, predominantly inattentive type: Secondary | ICD-10-CM | POA: Diagnosis not present

## 2020-03-10 DIAGNOSIS — Z1389 Encounter for screening for other disorder: Secondary | ICD-10-CM | POA: Diagnosis not present

## 2020-03-10 DIAGNOSIS — Z23 Encounter for immunization: Secondary | ICD-10-CM | POA: Diagnosis not present

## 2020-03-10 DIAGNOSIS — I482 Chronic atrial fibrillation, unspecified: Secondary | ICD-10-CM | POA: Diagnosis not present

## 2020-03-10 DIAGNOSIS — K2270 Barrett's esophagus without dysplasia: Secondary | ICD-10-CM | POA: Diagnosis not present

## 2020-03-10 DIAGNOSIS — Z0000 Encounter for general adult medical examination without abnormal findings: Secondary | ICD-10-CM | POA: Diagnosis not present

## 2020-03-10 DIAGNOSIS — Z79899 Other long term (current) drug therapy: Secondary | ICD-10-CM | POA: Diagnosis not present

## 2020-03-15 DIAGNOSIS — I482 Chronic atrial fibrillation, unspecified: Secondary | ICD-10-CM | POA: Diagnosis not present

## 2020-03-15 DIAGNOSIS — E7800 Pure hypercholesterolemia, unspecified: Secondary | ICD-10-CM | POA: Diagnosis not present

## 2020-03-15 DIAGNOSIS — I10 Essential (primary) hypertension: Secondary | ICD-10-CM | POA: Diagnosis not present

## 2020-03-17 ENCOUNTER — Other Ambulatory Visit: Payer: Self-pay

## 2020-03-17 ENCOUNTER — Ambulatory Visit (INDEPENDENT_AMBULATORY_CARE_PROVIDER_SITE_OTHER): Payer: Medicare Other | Admitting: Vascular Surgery

## 2020-03-17 ENCOUNTER — Encounter: Payer: Self-pay | Admitting: Vascular Surgery

## 2020-03-17 VITALS — BP 138/95 | HR 86 | Temp 97.5°F | Resp 18 | Ht 76.0 in | Wt 251.0 lb

## 2020-03-17 DIAGNOSIS — I8393 Asymptomatic varicose veins of bilateral lower extremities: Secondary | ICD-10-CM

## 2020-03-17 DIAGNOSIS — I83813 Varicose veins of bilateral lower extremities with pain: Secondary | ICD-10-CM | POA: Diagnosis not present

## 2020-03-17 NOTE — Progress Notes (Signed)
Patient is a 69 year old male who returns for follow-up today.  He was seen by Dr. Donnetta Hutching in December 2020.  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  Patient is a somewhat poor historian and seems to have various gaps in his memory.  He is able to recall long-term events but short-term memory is not very good.  It is difficult to ask him questions as he sometimes struggles with answers.  He does occasionally have swelling in his left leg.  He cannot remember if the swelling is worse or better in the morning versus the evening.  He also complains of numbness and tingling in his feet which sounds consistent with neuropathy.  Previously he had bleeding from his varicose veins and has not had any episodes of bleeding recently.  He is on Eliquis for atrial fibrillation.  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  Past Medical History:  Diagnosis Date  . ADD (attention deficit disorder)   . Asthma as child  . Asymptomatic gallstones   . Atrial fibrillation ([place])   . Atrial flutter ([place])   . Depression   . DJD (degenerative joint disease) of knee   . Dysrhythmia    Chronic Atrial Fib- seeing Dr. Lenna [facility]  . H/O peptic ulcer    past history with GI bleed- cauterization done throught scope  . History of kidney stones    past history-no problem now  . Hypercholesterolemia   . Orthostatic hypotension   . Pneumonia as child  . Prediabetes    none now  . Pulmonary nodules   . Sleep apnea    cpap set on 13  . Thrombocytopenia ([place])    pt denies  . Thyromegaly   . Varicose veins     Past Surgical History:  Procedure Laterality Date  . BIOPSY  04/18/2018   Procedure: BIOPSY;  Surgeon: Ronnette Juniper, MD;  Location: [facility];  Service: Gastroenterology;;  . ESOPHAGOGASTRODUODENOSCOPY N/A 04/18/2018   Procedure: ESOPHAGOGASTRODUODENOSCOPY (EGD);  Surgeon: Ronnette Juniper, MD;  [facility]: [facility];  Service: Gastroenterology;  Laterality: N/A;  . ESOPHAGOGASTRODUODENOSCOPY (EGD) WITH PROPOFOL N/A 01/05/2015   Procedure: ESOPHAGOGASTRODUODENOSCOPY (EGD) WITH PROPOFOL;  Surgeon: Garlan Fair, MD;  Location: [facility];  Service: Endoscopy;  Laterality: N/A;  . GASTRIC ROUX-EN-Y     '08-[name] (weight stable around 302 #  . KNEE ARTHROSCOPY    . TONSILLECTOMY    . ULNAR NERVE TRANSPOSITION Right 15 yrs ago    Current Outpatient Medications on File Prior to Visit  Medication Sig Dispense Refill  . acetaminophen (TYLENOL) 500 MG tablet Take 500 mg by mouth every 6 (six) hours as needed for moderate pain.    Marland Kitchen atorvastatin (LIPITOR) 40 MG tablet Take 20 mg by mouth at bedtime.     . Calcium Citrate-Vitamin D (CALCIUM CITRATE + D PO) Take 1 tablet by mouth daily.    . DULoxetine (CYMBALTA) 30 MG capsule Take by mouth daily.    Marland Kitchen ELIQUIS 5 MG TABS tablet Take 5 mg by mouth 2 (two) times daily.   0  . fludrocortisone (FLORINEF) 0.1 MG tablet Take 100 mcg by mouth daily.    Marland Kitchen LORazepam (ATIVAN) 0.5 MG tablet Take 0.5 mg by mouth every 6 (six) hours as needed for anxiety.   0  . Melatonin 10 MG TABS Take 1 tablet by mouth at bedtime as needed (sleep).    Marland Kitchen  Multiple Vitamins-Minerals (ONE DAILY MULTIVITAMIN MEN) TABS Take 1 tablet by mouth every evening.     . pantoprazole (PROTONIX) 40 MG tablet Take 80 mg by mouth at bedtime.     . pregabalin (LYRICA) 75 MG capsule Take 1 capsule (75 mg total) by mouth 2 (two) times daily. 60 capsule 2  . zolpidem (AMBIEN) 10 MG tablet Take 10 mg by mouth at bedtime.   0   No current facility-administered medications on file prior to visit.    Allergies  Allergen Reactions  . Aspirin     Scar stomach(internal)  . Benadryl [Diphenhydramine Hcl]     Memory loss  . Nsaids     Scar stomach(internal)  . Oxycodone Hcl     hallucinations   Physical exam:  Vitals:   03/17/20 1302  BP: (!) 138/95  Pulse: 86  Resp: 18  Temp: (!) 97.5 F  (36.4 C)  TempSrc: Temporal  SpO2: 100%  Weight: 251 lb (113.9 kg)  Height: 6\' 4"  (1.93 m)   Extremities: 2+ dorsalis pedis pulse bilaterally, he has varicosities consistent with Dr. Donnetta Hutching previous exam mainly in the right medial thigh and left calf.  He does have some hemosiderin staining around the ankle bilaterally.  There is trace edema.  Data: I reviewed the patient's previous reflux exam which showed recanalization of the left greater saphenous vein with a 10 mm greater saphenous.  He also has an accessory saphenous on the right side leading to varicosities in the right leg.  Also repeated portions of his exam today with the SonoSite at the bedside.  He did have a dilated saphenous vein with a few webs in it but I agree with Dr. Donnetta Hutching the laser probably would pass through this.  Assessment: Patient with recurrent recanalization of left greater saphenous vein with some symptoms of leg swelling and skin changes.  However, a lot of the patient's symptoms also related to neuropathy and degenerative joint disease.  I have some concerns about the patient being able to make informed consent decisions due to his severe memory deficits.  Plan: Patient was given a prescription today for bilateral lower extremity compression stockings he was fitted for these and given the stockings while he was here at the office visit today.  He stated that he forgot for never picked up the compression stockings previously when he saw Dr. Donnetta Hutching in December.  Patient will follow up with me in 3 months time to see if he has had improvement in symptoms.  If we are going to consider an intervention at some point in the future we will probably have to do a little more research on whether or not he has able to consent.  Ruta Hinds, MD [facility] Office: [phone]

## 2020-03-18 DIAGNOSIS — F332 Major depressive disorder, recurrent severe without psychotic features: Secondary | ICD-10-CM | POA: Diagnosis not present

## 2020-03-18 DIAGNOSIS — F410 Panic disorder [episodic paroxysmal anxiety] without agoraphobia: Secondary | ICD-10-CM | POA: Diagnosis not present

## 2020-03-18 DIAGNOSIS — F902 Attention-deficit hyperactivity disorder, combined type: Secondary | ICD-10-CM | POA: Diagnosis not present

## 2020-03-18 DIAGNOSIS — F411 Generalized anxiety disorder: Secondary | ICD-10-CM | POA: Diagnosis not present

## 2020-03-29 DIAGNOSIS — Z23 Encounter for immunization: Secondary | ICD-10-CM | POA: Diagnosis not present

## 2020-04-01 DIAGNOSIS — F332 Major depressive disorder, recurrent severe without psychotic features: Secondary | ICD-10-CM | POA: Diagnosis not present

## 2020-04-01 DIAGNOSIS — F410 Panic disorder [episodic paroxysmal anxiety] without agoraphobia: Secondary | ICD-10-CM | POA: Diagnosis not present

## 2020-04-01 DIAGNOSIS — F902 Attention-deficit hyperactivity disorder, combined type: Secondary | ICD-10-CM | POA: Diagnosis not present

## 2020-04-01 DIAGNOSIS — F411 Generalized anxiety disorder: Secondary | ICD-10-CM | POA: Diagnosis not present

## 2020-04-15 DIAGNOSIS — F902 Attention-deficit hyperactivity disorder, combined type: Secondary | ICD-10-CM | POA: Diagnosis not present

## 2020-04-15 DIAGNOSIS — F410 Panic disorder [episodic paroxysmal anxiety] without agoraphobia: Secondary | ICD-10-CM | POA: Diagnosis not present

## 2020-04-15 DIAGNOSIS — F411 Generalized anxiety disorder: Secondary | ICD-10-CM | POA: Diagnosis not present

## 2020-04-15 DIAGNOSIS — F332 Major depressive disorder, recurrent severe without psychotic features: Secondary | ICD-10-CM | POA: Diagnosis not present

## 2020-04-28 DIAGNOSIS — F332 Major depressive disorder, recurrent severe without psychotic features: Secondary | ICD-10-CM | POA: Diagnosis not present

## 2020-04-28 DIAGNOSIS — F411 Generalized anxiety disorder: Secondary | ICD-10-CM | POA: Diagnosis not present

## 2020-04-28 DIAGNOSIS — F902 Attention-deficit hyperactivity disorder, combined type: Secondary | ICD-10-CM | POA: Diagnosis not present

## 2020-04-28 DIAGNOSIS — F410 Panic disorder [episodic paroxysmal anxiety] without agoraphobia: Secondary | ICD-10-CM | POA: Diagnosis not present

## 2020-05-03 DIAGNOSIS — G4733 Obstructive sleep apnea (adult) (pediatric): Secondary | ICD-10-CM | POA: Diagnosis not present

## 2020-05-13 DIAGNOSIS — F902 Attention-deficit hyperactivity disorder, combined type: Secondary | ICD-10-CM | POA: Diagnosis not present

## 2020-05-13 DIAGNOSIS — F410 Panic disorder [episodic paroxysmal anxiety] without agoraphobia: Secondary | ICD-10-CM | POA: Diagnosis not present

## 2020-05-13 DIAGNOSIS — F411 Generalized anxiety disorder: Secondary | ICD-10-CM | POA: Diagnosis not present

## 2020-05-13 DIAGNOSIS — F332 Major depressive disorder, recurrent severe without psychotic features: Secondary | ICD-10-CM | POA: Diagnosis not present

## 2020-05-21 DIAGNOSIS — I10 Essential (primary) hypertension: Secondary | ICD-10-CM | POA: Diagnosis not present

## 2020-05-21 DIAGNOSIS — E7800 Pure hypercholesterolemia, unspecified: Secondary | ICD-10-CM | POA: Diagnosis not present

## 2020-05-21 DIAGNOSIS — I482 Chronic atrial fibrillation, unspecified: Secondary | ICD-10-CM | POA: Diagnosis not present

## 2020-05-27 DIAGNOSIS — F411 Generalized anxiety disorder: Secondary | ICD-10-CM | POA: Diagnosis not present

## 2020-05-27 DIAGNOSIS — F410 Panic disorder [episodic paroxysmal anxiety] without agoraphobia: Secondary | ICD-10-CM | POA: Diagnosis not present

## 2020-05-27 DIAGNOSIS — F902 Attention-deficit hyperactivity disorder, combined type: Secondary | ICD-10-CM | POA: Diagnosis not present

## 2020-05-27 DIAGNOSIS — F332 Major depressive disorder, recurrent severe without psychotic features: Secondary | ICD-10-CM | POA: Diagnosis not present

## 2020-06-01 DIAGNOSIS — E7800 Pure hypercholesterolemia, unspecified: Secondary | ICD-10-CM | POA: Diagnosis not present

## 2020-06-01 DIAGNOSIS — I10 Essential (primary) hypertension: Secondary | ICD-10-CM | POA: Diagnosis not present

## 2020-06-10 DIAGNOSIS — F902 Attention-deficit hyperactivity disorder, combined type: Secondary | ICD-10-CM | POA: Diagnosis not present

## 2020-06-10 DIAGNOSIS — F332 Major depressive disorder, recurrent severe without psychotic features: Secondary | ICD-10-CM | POA: Diagnosis not present

## 2020-06-10 DIAGNOSIS — F411 Generalized anxiety disorder: Secondary | ICD-10-CM | POA: Diagnosis not present

## 2020-06-10 DIAGNOSIS — F410 Panic disorder [episodic paroxysmal anxiety] without agoraphobia: Secondary | ICD-10-CM | POA: Diagnosis not present

## 2020-06-23 ENCOUNTER — Ambulatory Visit (INDEPENDENT_AMBULATORY_CARE_PROVIDER_SITE_OTHER): Payer: Medicare Other | Admitting: Vascular Surgery

## 2020-06-23 ENCOUNTER — Encounter: Payer: Self-pay | Admitting: Vascular Surgery

## 2020-06-23 ENCOUNTER — Other Ambulatory Visit: Payer: Self-pay

## 2020-06-23 VITALS — BP 115/72 | HR 90 | Temp 97.9°F | Resp 18 | Ht 76.0 in | Wt 254.0 lb

## 2020-06-23 DIAGNOSIS — I83813 Varicose veins of bilateral lower extremities with pain: Secondary | ICD-10-CM

## 2020-06-23 NOTE — Progress Notes (Signed)
Patient is a 69 year old male who returns for follow-up today.  He was last seen August 2021.  At that time we were seeing him for recanalization of his left greater saphenous vein.  He previously had a laser ablation by Dr. Donnetta Hutching in the remote past.  He occasionally has some swelling in his left leg but thinks this has been improved with compression stockings.  He is on Eliquis for atrial fibrillation.  He has degenerative joint disease in both knees.  He is not really interested in having any vein intervention currently as he is taking care of a family member and cannot really afford any downtime unless he has need for an urgent or emergent procedure.  Review of systems: He has no shortness of breath.  He has no chest pain.  Physical exam:  Vitals:   06/23/20 1414  BP: 115/72  Pulse: 90  Resp: 18  Temp: 97.9 F (36.6 C)  TempSrc: Temporal  SpO2: 98%  Weight: 254 lb (115.2 kg)  Height: 6\' 4"  (1.93 m)    Extremities: 2+ dorsalis pedis pulse bilaterally, varicose veins as depicted below      Assessment: Patient with recanalization left greater saphenous vein symptoms symptoms of leg swelling and skin changes.  He is currently okay with his symptom relief with compression stockings.  Plan: Patient will follow up in 6 months time.  He currently is caring for a family member and thinks that this family member may pass on in the next 6 months and he may consider an intervention at that point.  Otherwise he will continue to wear his compression stockings and elevate his legs.  Ruta Hinds, MD [facility] [place] Office: [phone]

## 2020-06-24 DIAGNOSIS — F410 Panic disorder [episodic paroxysmal anxiety] without agoraphobia: Secondary | ICD-10-CM | POA: Diagnosis not present

## 2020-06-24 DIAGNOSIS — F411 Generalized anxiety disorder: Secondary | ICD-10-CM | POA: Diagnosis not present

## 2020-06-24 DIAGNOSIS — F902 Attention-deficit hyperactivity disorder, combined type: Secondary | ICD-10-CM | POA: Diagnosis not present

## 2020-06-24 DIAGNOSIS — F332 Major depressive disorder, recurrent severe without psychotic features: Secondary | ICD-10-CM | POA: Diagnosis not present

## 2020-06-28 DIAGNOSIS — H5213 Myopia, bilateral: Secondary | ICD-10-CM | POA: Diagnosis not present

## 2020-06-28 DIAGNOSIS — H2513 Age-related nuclear cataract, bilateral: Secondary | ICD-10-CM | POA: Diagnosis not present

## 2020-06-28 DIAGNOSIS — H18513 Endothelial corneal dystrophy, bilateral: Secondary | ICD-10-CM | POA: Diagnosis not present

## 2020-06-28 DIAGNOSIS — H401132 Primary open-angle glaucoma, bilateral, moderate stage: Secondary | ICD-10-CM | POA: Diagnosis not present

## 2020-06-28 DIAGNOSIS — H16223 Keratoconjunctivitis sicca, not specified as Sjogren's, bilateral: Secondary | ICD-10-CM | POA: Diagnosis not present

## 2020-06-29 ENCOUNTER — Ambulatory Visit (INDEPENDENT_AMBULATORY_CARE_PROVIDER_SITE_OTHER): Payer: Medicare Other | Admitting: Cardiology

## 2020-06-29 ENCOUNTER — Other Ambulatory Visit: Payer: Self-pay

## 2020-06-29 ENCOUNTER — Encounter: Payer: Self-pay | Admitting: Cardiology

## 2020-06-29 VITALS — BP 120/70 | HR 86 | Ht 76.0 in | Wt 257.0 lb

## 2020-06-29 DIAGNOSIS — I482 Chronic atrial fibrillation, unspecified: Secondary | ICD-10-CM | POA: Diagnosis not present

## 2020-06-29 DIAGNOSIS — I951 Orthostatic hypotension: Secondary | ICD-10-CM

## 2020-06-29 DIAGNOSIS — R600 Localized edema: Secondary | ICD-10-CM

## 2020-06-29 NOTE — Patient Instructions (Signed)
Medication Instructions:  The current medical regimen is effective;  continue present plan and medications.  *If you need a refill on your cardiac medications before your next appointment, please call your pharmacy*  Follow-Up: At [facility], you and your health needs are our priority.  As part of our continuing mission to provide you with exceptional heart care, we have created designated Provider Care Teams.  These Care Teams include your primary Cardiologist (physician) and Advanced Practice Providers (APPs -  Physician Assistants and Nurse Practitioners) who all work together to provide you with the care you need, when you need it.  We recommend signing up for the patient portal called "MyChart".  Sign up information is provided on this After Visit Summary.  MyChart is used to connect with patients for Virtual Visits (Telemedicine).  Patients are able to view lab/test results, encounter notes, upcoming appointments, etc.  Non-urgent messages can be sent to your provider as well.   To learn more about what you can do with MyChart, go to [email or web address].    Your next appointment:   12 month(s)  The format for your next appointment:   In Person  Provider:   Mark Skains, MD   Thank you for choosing [facility]!!      

## 2020-06-29 NOTE — Progress Notes (Signed)
Cardiology Office Note:    Date:  06/30/2020   ID:  Francisco Padilla, DOB 1950-12-06, MRN [id number]  PCP:  Francisco Manes, MD  [facility] Cardiologist:  Francisco Furbish, MD  [facility] Electrophysiologist:  None   Referring MD: Francisco Manes, MD     History of Present Illness:    Francisco Padilla is a 69 y.o. male follow-up of atrial fibrillation persistent.  Has prior GI bleed Gastric bypass 2008 Obstructive sleep apnea Vein stripping Saw Dr. Thomasene Padilla at [place]. Started Eliquis. Dr. Deno Padilla as well.  Continuing with rate control.  Francisco Padilla is his husband, has had a stroke, 10 years ago.  Takes care of him. The 10-year Francisco Padilla was hard emotionally for him.  Off of metoprolol.  Doing well.  No bleeding.  Syncope secondary to hypotension, on low-dose fludrocortisone.  Liberalize the salt. He has not had any major episodes in quite some time.    Past Medical History:  Diagnosis Date  . ADD (attention deficit disorder)   . Asthma as child  . Asymptomatic gallstones   . Atrial fibrillation ([place])   . Atrial flutter ([place])   . Depression   . DJD (degenerative joint disease) of knee   . Dysrhythmia    Chronic Atrial Fib- seeing Dr. Lenna [facility]  . H/O peptic ulcer    past history with GI bleed- cauterization done throught scope  . History of kidney stones    past history-no problem now  . Hypercholesterolemia   . Orthostatic hypotension   . Pneumonia as child  . Prediabetes    none now  . Pulmonary nodules   . Sleep apnea    cpap set on 13  . Thrombocytopenia ([place])    pt denies  . Thyromegaly   . Varicose veins     Past Surgical History:  Procedure Laterality Date  . BIOPSY  04/18/2018   Procedure: BIOPSY;  Surgeon: Francisco Juniper, MD;  [facility];  Service: Gastroenterology;;  . ESOPHAGOGASTRODUODENOSCOPY N/A 04/18/2018   Procedure: ESOPHAGOGASTRODUODENOSCOPY (EGD);  Surgeon: Francisco Juniper, MD;  [facility];  Service: Gastroenterology;   Laterality: N/A;  . ESOPHAGOGASTRODUODENOSCOPY (EGD) WITH PROPOFOL N/A 01/05/2015   Procedure: ESOPHAGOGASTRODUODENOSCOPY (EGD) WITH PROPOFOL;  Surgeon: Francisco Fair, MD;  [facility];  Service: Endoscopy;  Laterality: N/A;  . GASTRIC ROUX-EN-Y     '08-[name] (weight stable around 302 #  . KNEE ARTHROSCOPY    . TONSILLECTOMY    . ULNAR NERVE TRANSPOSITION Right 15 yrs ago    Current Medications: Current Meds  Medication Sig  . acetaminophen (TYLENOL) 500 MG tablet Take 500 mg by mouth every 6 (six) hours as needed for moderate pain.  Marland Kitchen atorvastatin (LIPITOR) 40 MG tablet Take 20 mg by mouth at bedtime.   . Calcium Citrate-Vitamin D (CALCIUM CITRATE + D PO) Take 1 tablet by mouth daily.  . DULoxetine (CYMBALTA) 30 MG capsule Take by mouth daily.  Marland Kitchen ELIQUIS 5 MG TABS tablet Take 5 mg by mouth 2 (two) times daily.   . fludrocortisone (FLORINEF) 0.1 MG tablet Take 100 mcg by mouth daily.  Marland Kitchen LORazepam (ATIVAN) 0.5 MG tablet Take 0.5 mg by mouth every 6 (six) hours as needed for anxiety.   . Melatonin 10 MG TABS Take 1 tablet by mouth at bedtime as needed (sleep).  . Multiple Vitamins-Minerals (ONE DAILY MULTIVITAMIN MEN) TABS Take 1 tablet by mouth every evening.   . pantoprazole (PROTONIX) 40 MG tablet Take 80 mg by  mouth at bedtime.   . pregabalin (LYRICA) 75 MG capsule Take 1 capsule (75 mg total) by mouth 2 (two) times daily.  . sertraline (ZOLOFT) 50 MG tablet Take 50 mg by mouth daily.  Marland Kitchen zolpidem (AMBIEN) 10 MG tablet Take 10 mg by mouth at bedtime.      Allergies:   Aspirin, Benadryl [diphenhydramine hcl], Nsaids, and Oxycodone hcl   Social History   Socioeconomic History  . Marital status: Single    Spouse name: Not on file  . Number of children: Not on file  . Years of education: Not on file  . Highest education level: Not on file  Occupational History  . Not on file  Tobacco Use  . Smoking status: Never Smoker  . Smokeless tobacco: Never Used  Vaping Use   . Vaping Use: Never used  Substance and Sexual Activity  . Alcohol use: No    Alcohol/week: 0.0 standard drinks  . Drug use: No  . Sexual activity: Yes  Other Topics Concern  . Not on file  Social History Narrative  . Not on file   Social Determinants of Health   Financial Resource Strain:   . Difficulty of Paying Living Expenses: Not on file  Food Insecurity:   . Worried About Charity fundraiser in the Last Year: Not on file  . Ran Out of Food in the Last Year: Not on file  Transportation Needs:   . Lack of Transportation (Medical): Not on file  . Lack of Transportation (Non-Medical): Not on file  Physical Activity:   . Days of Exercise per Week: Not on file  . Minutes of Exercise per Session: Not on file  Stress:   . Feeling of Stress : Not on file  Social Connections:   . Frequency of Communication with Friends and Family: Not on file  . Frequency of Social Gatherings with Friends and Family: Not on file  . Attends Religious Services: Not on file  . Active Member of Clubs or Organizations: Not on file  . Attends Archivist Meetings: Not on file  . Marital Status: Not on file     Family History: The patient's family history includes Heart attack in his father; Heart disease in his father and mother; Heart failure in his mother. There is no history of Stroke.  ROS:   Please see the history of present illness. Denies any fevers chills nausea vomiting syncope bleeding.  All other systems reviewed and are negative.  EKGs/Labs/Other Studies Reviewed:      EKG:  EKG is  ordered today.  The ekg ordered today demonstrates atrial fibrillation 86 with  Recent Labs: No results found for requested labs within last 8760 hours.  Recent Lipid Panel No results found for: CHOL, TRIG, HDL, CHOLHDL, VLDL, LDLCALC, LDLDIRECT   Physical Exam:    VS:  BP 120/70 (BP Location: Left Arm, Patient Position: Sitting, Cuff Size: Normal)   Pulse 86   Ht 6\' 4"  (1.93 m)   Wt  257 lb (116.6 kg)   SpO2 98%   BMI 31.28 kg/m     Wt Readings from Last 3 Encounters:  06/29/20 257 lb (116.6 kg)  06/23/20 254 lb (115.2 kg)  03/17/20 251 lb (113.9 kg)     GEN:  Well nourished, well developed in no acute distress HEENT: Normal NECK: No JVD; No carotid bruits LYMPHATICS: No lymphadenopathy CARDIAC: irreg irreg, no murmurs, rubs, gallops RESPIRATORY:  Clear to auscultation without rales, wheezing or rhonchi  ABDOMEN: Soft, non-tender, non-distended MUSCULOSKELETAL:  No edema; No deformity  SKIN: Warm and dry NEUROLOGIC:  Alert and oriented x 3 PSYCHIATRIC:  Normal affect   ASSESSMENT:    1. Chronic atrial fibrillation ([place])   2. Lower extremity edema   3. Orthostatic hypotension    PLAN:    In order of problems listed above:  Longstanding persistent atrial fibrillation -Overall maintaining rate control without AV nodal blocking agent.  No longer on metoprolol.  Heart rate today 86.  Excellent.  Chronic anticoagulation -Continue with Eliquis.  No signs of bleeding.  Creatinine 0.9 hemoglobin 15.4 from outside labs.  Excellent.  Orthostatic hypotension -Continuing with low-dose fludrocortisone.  Last potassium 4.1.  Understands to liberalize salt. Be careful with sildenafil.  Blood pressure has been adequate.  Lower extremity edema -venous insufficiency. Compression stockings excellent. Sometimes feels aching in his legs when he takes his compression hose off. Continues to follow with vascular.  1 year follow-up.     Medication Adjustments/Labs and Tests Ordered: Current medicines are reviewed at length with the patient today.  Concerns regarding medicines are outlined above.  Orders Placed This Encounter  Procedures  . EKG 12-Lead   No orders of the defined types were placed in this encounter.   Patient Instructions  Medication Instructions:  The current medical regimen is effective;  continue present plan and medications.  *If you need  a refill on your cardiac medications before your next appointment, please call your pharmacy*  Follow-Up: At [facility], you and your health needs are our priority.  As part of our continuing mission to provide you with exceptional heart care, we have created designated Provider Care Teams.  These Care Teams include your primary Cardiologist (physician) and Advanced Practice Providers (APPs -  Physician Assistants and Nurse Practitioners) who all work together to provide you with the care you need, when you need it.  We recommend signing up for the patient portal called "MyChart".  Sign up information is provided on this After Visit Summary.  MyChart is used to connect with patients for Virtual Visits (Telemedicine).  Patients are able to view lab/test results, encounter notes, upcoming appointments, etc.  Non-urgent messages can be sent to your provider as well.   To learn more about what you can do with MyChart, go to [email or web address].    Your next appointment:   12 month(s)  The format for your next appointment:   In Person  Provider:   Candee Furbish, MD   Thank you for choosing [facility]!!        Signed, Francisco Furbish, MD  06/30/2020 6:45 AM    [place]

## 2020-07-05 DIAGNOSIS — G609 Hereditary and idiopathic neuropathy, unspecified: Secondary | ICD-10-CM | POA: Diagnosis not present

## 2020-07-05 DIAGNOSIS — I10 Essential (primary) hypertension: Secondary | ICD-10-CM | POA: Diagnosis not present

## 2020-07-05 DIAGNOSIS — F411 Generalized anxiety disorder: Secondary | ICD-10-CM | POA: Diagnosis not present

## 2020-07-05 DIAGNOSIS — I482 Chronic atrial fibrillation, unspecified: Secondary | ICD-10-CM | POA: Diagnosis not present

## 2020-07-08 DIAGNOSIS — F332 Major depressive disorder, recurrent severe without psychotic features: Secondary | ICD-10-CM | POA: Diagnosis not present

## 2020-07-08 DIAGNOSIS — F411 Generalized anxiety disorder: Secondary | ICD-10-CM | POA: Diagnosis not present

## 2020-07-08 DIAGNOSIS — F902 Attention-deficit hyperactivity disorder, combined type: Secondary | ICD-10-CM | POA: Diagnosis not present

## 2020-07-08 DIAGNOSIS — F410 Panic disorder [episodic paroxysmal anxiety] without agoraphobia: Secondary | ICD-10-CM | POA: Diagnosis not present

## 2020-07-13 DIAGNOSIS — I482 Chronic atrial fibrillation, unspecified: Secondary | ICD-10-CM | POA: Diagnosis not present

## 2020-07-13 DIAGNOSIS — E7800 Pure hypercholesterolemia, unspecified: Secondary | ICD-10-CM | POA: Diagnosis not present

## 2020-07-13 DIAGNOSIS — I10 Essential (primary) hypertension: Secondary | ICD-10-CM | POA: Diagnosis not present

## 2020-07-29 DIAGNOSIS — F332 Major depressive disorder, recurrent severe without psychotic features: Secondary | ICD-10-CM | POA: Diagnosis not present

## 2020-07-29 DIAGNOSIS — F411 Generalized anxiety disorder: Secondary | ICD-10-CM | POA: Diagnosis not present

## 2020-07-29 DIAGNOSIS — F410 Panic disorder [episodic paroxysmal anxiety] without agoraphobia: Secondary | ICD-10-CM | POA: Diagnosis not present

## 2020-07-29 DIAGNOSIS — F902 Attention-deficit hyperactivity disorder, combined type: Secondary | ICD-10-CM | POA: Diagnosis not present

## 2020-08-20 DIAGNOSIS — I10 Essential (primary) hypertension: Secondary | ICD-10-CM | POA: Diagnosis not present

## 2020-08-20 DIAGNOSIS — E7800 Pure hypercholesterolemia, unspecified: Secondary | ICD-10-CM | POA: Diagnosis not present

## 2020-09-03 DIAGNOSIS — H25813 Combined forms of age-related cataract, bilateral: Secondary | ICD-10-CM | POA: Diagnosis not present

## 2020-09-03 DIAGNOSIS — H401111 Primary open-angle glaucoma, right eye, mild stage: Secondary | ICD-10-CM | POA: Diagnosis not present

## 2020-09-03 DIAGNOSIS — H401121 Primary open-angle glaucoma, left eye, mild stage: Secondary | ICD-10-CM | POA: Diagnosis not present

## 2020-09-03 DIAGNOSIS — H353131 Nonexudative age-related macular degeneration, bilateral, early dry stage: Secondary | ICD-10-CM | POA: Diagnosis not present

## 2020-09-09 DIAGNOSIS — F902 Attention-deficit hyperactivity disorder, combined type: Secondary | ICD-10-CM | POA: Diagnosis not present

## 2020-09-09 DIAGNOSIS — F332 Major depressive disorder, recurrent severe without psychotic features: Secondary | ICD-10-CM | POA: Diagnosis not present

## 2020-09-09 DIAGNOSIS — F410 Panic disorder [episodic paroxysmal anxiety] without agoraphobia: Secondary | ICD-10-CM | POA: Diagnosis not present

## 2020-09-09 DIAGNOSIS — F411 Generalized anxiety disorder: Secondary | ICD-10-CM | POA: Diagnosis not present

## 2020-09-21 ENCOUNTER — Ambulatory Visit: Payer: Self-pay

## 2020-09-21 ENCOUNTER — Encounter: Payer: Self-pay | Admitting: Orthopedic Surgery

## 2020-09-21 ENCOUNTER — Ambulatory Visit (INDEPENDENT_AMBULATORY_CARE_PROVIDER_SITE_OTHER): Payer: Medicare Other | Admitting: Orthopedic Surgery

## 2020-09-21 ENCOUNTER — Ambulatory Visit (INDEPENDENT_AMBULATORY_CARE_PROVIDER_SITE_OTHER): Payer: Medicare Other

## 2020-09-21 DIAGNOSIS — G8929 Other chronic pain: Secondary | ICD-10-CM | POA: Diagnosis not present

## 2020-09-21 DIAGNOSIS — M25561 Pain in right knee: Secondary | ICD-10-CM | POA: Diagnosis not present

## 2020-09-21 DIAGNOSIS — M25562 Pain in left knee: Secondary | ICD-10-CM

## 2020-09-21 NOTE — Progress Notes (Signed)
Office Visit Note   Patient: Francisco Padilla           Date of Birth: 01/04/1951           MRN: [id number] Visit Date: 09/21/2020              Requested by: Lajean Manes, MD 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  Patient is status post knee arthroscopy in 2005.  Patient complains of his knee giving way.  Assessment & Plan: Visit Diagnoses:  1. Chronic pain of both knees     Plan: Recommended a cane to minimize risk of falling with his knee giving way.  Recommended Voltaren gel both knees were injected.  Patient states he would like to proceed with surgery once he does not have the responsibility for caring for his significant other.  Follow-Up Instructions: No follow-ups on file.   Ortho Exam  Patient is alert, oriented, no adenopathy, well-dressed, normal affect, normal respiratory effort. Examination patient has varus alignment of both knees he has an antalgic gait he has tenderness to palpation of the medial joint line bilaterally with crepitation with range of motion.  Imaging: XR Knee 1-2 Views Left  Result Date: 09/21/2020 2 view radiographs of the left knee shows bone-on-bone contact medial joint line periarticular bony spurs.  XR Knee 1-2 Views Right  Result Date: 09/21/2020 2 view radiographs of the right knee shows bone-on-bone contact medial joint line varus alignment with periarticular bony spurs  No images are attached to the encounter.  Labs: Lab Results  Component Value Date   REPTSTATUS 10/23/2009 FINAL 10/21/2009   CULT NO GROWTH 10/21/2009     Lab Results  Component Value Date   ALBUMIN  4.1 10/22/2009   ALBUMIN 4.6 10/21/2009    No results found for: MG No results found for: VD25OH  No results found for: PREALBUMIN CBC EXTENDED Latest Ref Rng & Units 10/23/2009 10/23/2009 10/22/2009  WBC 4.0 - 10.5 K/uL 6.2 6.3 6.4  RBC 4.22 - 5.81 MIL/uL 3.21(L) 3.29(L) 3.30(L)  HGB 13.0 - 17.0 g/dL 10.4(L) 10.6(L) 10.7(L)  HCT 39.0 - 52.0 % 30.3(L) 31.1(L) 30.9(L)  PLT 150 - 400 K/uL 194 202 218  NEUTROABS 1.7 - 7.7 K/uL - - -  LYMPHSABS 0.7 - 4.0 K/uL - - -     There is no height or weight on file to calculate BMI.  Orders:  Orders Placed This Encounter  Procedures   XR Knee 1-2 Views Right   XR Knee 1-2 Views Left   No orders of the defined types were placed in this encounter.    Procedures: Large Joint Inj: bilateral knee on 09/21/2020 5:10 PM Indications: pain and diagnostic evaluation Details: 22 G 1.5 in needle, anteromedial approach  Arthrogram: No  Outcome: tolerated well, no immediate complications Procedure, treatment alternatives, risks and benefits explained, specific risks discussed. Consent was given by the patient. Immediately prior to procedure a time out was called to verify the correct patient, procedure, equipment, support staff and site/side marked as required. Patient was prepped and draped in the usual sterile fashion.  Clinical Data: No additional findings.  ROS:  All other systems negative, except as noted in the HPI. Review of Systems  Objective: Vital Signs: There were no vitals taken for this visit.  Specialty Comments:  No specialty comments available.  PMFS History: Patient Active Problem List   Diagnosis Date Noted   Hypotension 02/20/2019   Chronic anticoagulation 03/05/2015   Abnormal chest x-ray with multiple lung nodules 12/14/2014   AS (aortic stenosis) 12/14/2014   Atrial flutter by electrocardiogram ([place]) 12/14/2014   CAD (coronary artery disease) 12/14/2014   DJD (degenerative joint disease) 12/14/2014    Essential hypertension 12/14/2014   History of GI bleed 12/14/2014   Nephrolithiasis 12/14/2014   Other activity(E029.9) 12/14/2014   Prediabetes 12/14/2014   Thrombocytopenia ([place]) 12/14/2014   Arterial blood pressure decreased 10/28/2014   Atrial fibrillation ([place]) 09/02/2014   Morbid obesity ([place]) 09/02/2014   AF (paroxysmal atrial fibrillation) ([place]) 08/03/2014   Gastroduodenal ulcer 08/03/2014   Apnea, sleep 08/03/2014   Glaucoma suspect 02/21/2013   Glaucoma suspect of both eyes 02/21/2013   Herpes 04/11/2012   Routine general medical examination at a health care facility 04/11/2012   DYSLIPIDEMIA 05/31/2009   DEPRESSION/ANXIETY 05/31/2009   ATTENTION DEFICIT DISORDER 05/31/2009   Obstructive sleep apnea 05/31/2009   GERD 05/31/2009   GOUT, HX OF 05/31/2009   Past Medical History:  Diagnosis Date   ADD (attention deficit disorder)    Asthma as child   Asymptomatic gallstones    Atrial fibrillation (HCC)    Atrial flutter (HCC)    Depression    DJD (degenerative joint disease) of knee    Dysrhythmia    Chronic Atrial Fib- seeing Dr. Lenna Sciara [facility]   H/O peptic ulcer    past history with GI bleed- cauterization done throught scope   History of kidney stones    past history-no problem now   Hypercholesterolemia    Orthostatic hypotension    Pneumonia as child   Prediabetes    none now   Pulmonary nodules    Sleep apnea    cpap set on 13   Thrombocytopenia (HCC)    pt denies   Thyromegaly    Varicose veins     Family History  Problem Relation Age of Onset   Heart disease Mother    Heart failure Mother    Heart disease Father    Heart attack Father    Stroke Neg Hx     Past Surgical History:  Procedure Laterality Date   BIOPSY  04/18/2018   Procedure: BIOPSY;  Surgeon: Ronnette Juniper, MD;  Location: [facility];  Service: Gastroenterology;;   ESOPHAGOGASTRODUODENOSCOPY N/A 04/18/2018   Procedure:  ESOPHAGOGASTRODUODENOSCOPY (EGD);  Surgeon: Ronnette Juniper, MD;  [facility];  Service: Gastroenterology;  Laterality: N/A;   ESOPHAGOGASTRODUODENOSCOPY (EGD) WITH PROPOFOL N/A 01/05/2015   Procedure: ESOPHAGOGASTRODUODENOSCOPY (EGD) WITH PROPOFOL;  Surgeon: Garlan Fair, MD;  [facility];  Service: Endoscopy;  Laterality: N/A;   GASTRIC ROUX-EN-Y     '08-[name] (weight stable around 302 #   KNEE ARTHROSCOPY     TONSILLECTOMY     ULNAR NERVE TRANSPOSITION Right 15 yrs ago   Social History   Occupational History   Not on file  Tobacco Use   Smoking status: Never Smoker   Smokeless tobacco: Never Used  Vaping Use   Vaping Use: Never used  Substance and Sexual Activity   Alcohol use: No    Alcohol/week: 0.0 standard drinks   Drug use: No  Sexual activity: Yes

## 2020-09-30 DIAGNOSIS — F410 Panic disorder [episodic paroxysmal anxiety] without agoraphobia: Secondary | ICD-10-CM | POA: Diagnosis not present

## 2020-09-30 DIAGNOSIS — F332 Major depressive disorder, recurrent severe without psychotic features: Secondary | ICD-10-CM | POA: Diagnosis not present

## 2020-09-30 DIAGNOSIS — F902 Attention-deficit hyperactivity disorder, combined type: Secondary | ICD-10-CM | POA: Diagnosis not present

## 2020-09-30 DIAGNOSIS — F411 Generalized anxiety disorder: Secondary | ICD-10-CM | POA: Diagnosis not present

## 2020-10-05 DIAGNOSIS — I482 Chronic atrial fibrillation, unspecified: Secondary | ICD-10-CM | POA: Diagnosis not present

## 2020-10-05 DIAGNOSIS — E7800 Pure hypercholesterolemia, unspecified: Secondary | ICD-10-CM | POA: Diagnosis not present

## 2020-10-05 DIAGNOSIS — I10 Essential (primary) hypertension: Secondary | ICD-10-CM | POA: Diagnosis not present

## 2020-10-06 DIAGNOSIS — H35433 Paving stone degeneration of retina, bilateral: Secondary | ICD-10-CM | POA: Diagnosis not present

## 2020-10-06 DIAGNOSIS — H43813 Vitreous degeneration, bilateral: Secondary | ICD-10-CM | POA: Diagnosis not present

## 2020-10-06 DIAGNOSIS — H33311 Horseshoe tear of retina without detachment, right eye: Secondary | ICD-10-CM | POA: Diagnosis not present

## 2020-10-06 DIAGNOSIS — H353131 Nonexudative age-related macular degeneration, bilateral, early dry stage: Secondary | ICD-10-CM | POA: Diagnosis not present

## 2020-10-14 DIAGNOSIS — F411 Generalized anxiety disorder: Secondary | ICD-10-CM | POA: Diagnosis not present

## 2020-10-14 DIAGNOSIS — F902 Attention-deficit hyperactivity disorder, combined type: Secondary | ICD-10-CM | POA: Diagnosis not present

## 2020-10-14 DIAGNOSIS — F410 Panic disorder [episodic paroxysmal anxiety] without agoraphobia: Secondary | ICD-10-CM | POA: Diagnosis not present

## 2020-10-14 DIAGNOSIS — F332 Major depressive disorder, recurrent severe without psychotic features: Secondary | ICD-10-CM | POA: Diagnosis not present

## 2020-10-25 DIAGNOSIS — H33311 Horseshoe tear of retina without detachment, right eye: Secondary | ICD-10-CM | POA: Diagnosis not present

## 2020-10-28 DIAGNOSIS — F902 Attention-deficit hyperactivity disorder, combined type: Secondary | ICD-10-CM | POA: Diagnosis not present

## 2020-10-28 DIAGNOSIS — F332 Major depressive disorder, recurrent severe without psychotic features: Secondary | ICD-10-CM | POA: Diagnosis not present

## 2020-10-28 DIAGNOSIS — F410 Panic disorder [episodic paroxysmal anxiety] without agoraphobia: Secondary | ICD-10-CM | POA: Diagnosis not present

## 2020-10-28 DIAGNOSIS — F411 Generalized anxiety disorder: Secondary | ICD-10-CM | POA: Diagnosis not present

## 2020-11-02 DIAGNOSIS — F439 Reaction to severe stress, unspecified: Secondary | ICD-10-CM | POA: Diagnosis not present

## 2020-11-11 DIAGNOSIS — F410 Panic disorder [episodic paroxysmal anxiety] without agoraphobia: Secondary | ICD-10-CM | POA: Diagnosis not present

## 2020-11-11 DIAGNOSIS — F902 Attention-deficit hyperactivity disorder, combined type: Secondary | ICD-10-CM | POA: Diagnosis not present

## 2020-11-11 DIAGNOSIS — F332 Major depressive disorder, recurrent severe without psychotic features: Secondary | ICD-10-CM | POA: Diagnosis not present

## 2020-11-11 DIAGNOSIS — F411 Generalized anxiety disorder: Secondary | ICD-10-CM | POA: Diagnosis not present

## 2020-11-12 DIAGNOSIS — I8393 Asymptomatic varicose veins of bilateral lower extremities: Secondary | ICD-10-CM

## 2020-11-12 DIAGNOSIS — I350 Nonrheumatic aortic (valve) stenosis: Secondary | ICD-10-CM | POA: Diagnosis not present

## 2020-11-12 DIAGNOSIS — E7800 Pure hypercholesterolemia, unspecified: Secondary | ICD-10-CM | POA: Diagnosis not present

## 2020-11-12 DIAGNOSIS — I482 Chronic atrial fibrillation, unspecified: Secondary | ICD-10-CM | POA: Diagnosis not present

## 2020-11-12 DIAGNOSIS — I10 Essential (primary) hypertension: Secondary | ICD-10-CM | POA: Diagnosis not present

## 2020-11-12 DIAGNOSIS — I700 Atherosclerosis of aorta: Secondary | ICD-10-CM | POA: Diagnosis not present

## 2020-11-18 DIAGNOSIS — F410 Panic disorder [episodic paroxysmal anxiety] without agoraphobia: Secondary | ICD-10-CM | POA: Diagnosis not present

## 2020-11-18 DIAGNOSIS — F332 Major depressive disorder, recurrent severe without psychotic features: Secondary | ICD-10-CM | POA: Diagnosis not present

## 2020-11-18 DIAGNOSIS — F411 Generalized anxiety disorder: Secondary | ICD-10-CM | POA: Diagnosis not present

## 2020-11-18 DIAGNOSIS — F902 Attention-deficit hyperactivity disorder, combined type: Secondary | ICD-10-CM | POA: Diagnosis not present

## 2020-11-23 DIAGNOSIS — H31091 Other chorioretinal scars, right eye: Secondary | ICD-10-CM | POA: Diagnosis not present

## 2020-11-23 DIAGNOSIS — H353131 Nonexudative age-related macular degeneration, bilateral, early dry stage: Secondary | ICD-10-CM | POA: Diagnosis not present

## 2020-11-23 DIAGNOSIS — H35433 Paving stone degeneration of retina, bilateral: Secondary | ICD-10-CM | POA: Diagnosis not present

## 2020-11-23 DIAGNOSIS — H43813 Vitreous degeneration, bilateral: Secondary | ICD-10-CM | POA: Diagnosis not present

## 2020-11-25 DIAGNOSIS — F411 Generalized anxiety disorder: Secondary | ICD-10-CM | POA: Diagnosis not present

## 2020-11-25 DIAGNOSIS — F410 Panic disorder [episodic paroxysmal anxiety] without agoraphobia: Secondary | ICD-10-CM | POA: Diagnosis not present

## 2020-11-25 DIAGNOSIS — F902 Attention-deficit hyperactivity disorder, combined type: Secondary | ICD-10-CM | POA: Diagnosis not present

## 2020-11-25 DIAGNOSIS — F332 Major depressive disorder, recurrent severe without psychotic features: Secondary | ICD-10-CM | POA: Diagnosis not present

## 2020-12-09 DIAGNOSIS — F410 Panic disorder [episodic paroxysmal anxiety] without agoraphobia: Secondary | ICD-10-CM | POA: Diagnosis not present

## 2020-12-09 DIAGNOSIS — F902 Attention-deficit hyperactivity disorder, combined type: Secondary | ICD-10-CM | POA: Diagnosis not present

## 2020-12-09 DIAGNOSIS — F411 Generalized anxiety disorder: Secondary | ICD-10-CM | POA: Diagnosis not present

## 2020-12-09 DIAGNOSIS — F332 Major depressive disorder, recurrent severe without psychotic features: Secondary | ICD-10-CM | POA: Diagnosis not present

## 2020-12-17 DIAGNOSIS — E7800 Pure hypercholesterolemia, unspecified: Secondary | ICD-10-CM | POA: Diagnosis not present

## 2020-12-17 DIAGNOSIS — I10 Essential (primary) hypertension: Secondary | ICD-10-CM | POA: Diagnosis not present

## 2020-12-21 ENCOUNTER — Ambulatory Visit (INDEPENDENT_AMBULATORY_CARE_PROVIDER_SITE_OTHER): Payer: Medicare Other | Admitting: Orthopedic Surgery

## 2020-12-21 DIAGNOSIS — G8929 Other chronic pain: Secondary | ICD-10-CM

## 2020-12-21 DIAGNOSIS — R5383 Other fatigue: Secondary | ICD-10-CM | POA: Diagnosis not present

## 2020-12-23 DIAGNOSIS — F411 Generalized anxiety disorder: Secondary | ICD-10-CM | POA: Diagnosis not present

## 2020-12-23 DIAGNOSIS — F332 Major depressive disorder, recurrent severe without psychotic features: Secondary | ICD-10-CM | POA: Diagnosis not present

## 2020-12-23 DIAGNOSIS — F902 Attention-deficit hyperactivity disorder, combined type: Secondary | ICD-10-CM | POA: Diagnosis not present

## 2020-12-23 DIAGNOSIS — F410 Panic disorder [episodic paroxysmal anxiety] without agoraphobia: Secondary | ICD-10-CM | POA: Diagnosis not present

## 2021-01-06 DIAGNOSIS — F902 Attention-deficit hyperactivity disorder, combined type: Secondary | ICD-10-CM | POA: Diagnosis not present

## 2021-01-06 DIAGNOSIS — F410 Panic disorder [episodic paroxysmal anxiety] without agoraphobia: Secondary | ICD-10-CM | POA: Diagnosis not present

## 2021-01-06 DIAGNOSIS — F332 Major depressive disorder, recurrent severe without psychotic features: Secondary | ICD-10-CM | POA: Diagnosis not present

## 2021-01-06 DIAGNOSIS — F411 Generalized anxiety disorder: Secondary | ICD-10-CM | POA: Diagnosis not present

## 2021-01-11 ENCOUNTER — Encounter: Payer: Self-pay | Admitting: Orthopedic Surgery

## 2021-01-11 DIAGNOSIS — M25562 Pain in left knee: Secondary | ICD-10-CM

## 2021-01-11 DIAGNOSIS — M25561 Pain in right knee: Secondary | ICD-10-CM | POA: Diagnosis not present

## 2021-01-11 DIAGNOSIS — G8929 Other chronic pain: Secondary | ICD-10-CM

## 2021-01-11 NOTE — Progress Notes (Signed)
Office Visit Note   Patient: Francisco Padilla           Date of Birth: [birth year]           MRN: [id number] Visit Date: 12/21/2020              Requested by: Lajean Manes, MD [address] [place],  [place] [address] PCP: Lajean Manes, MD  Chief Complaint  Patient presents with   Left Knee - Follow-up    S/p injection bilateral knees 09/21/20   Right Knee - Follow-up      HPI: Patient is a 70 year old gentleman who presents in follow-up for osteoarthritis both knees status post injections approximately 3 months ago.  Patient states he recently tripped on a curb and had blunt trauma to both knees with abrasions.  Patient states he has started exercising at the [facility].  He is currently wearing knee-high compression socks.  Assessment & Plan: Visit Diagnoses:  1. Chronic pain of both knees     Plan: Plan for repeat injection both knees continue with exercising follow-up in 2 months  Follow-Up Instructions: Return in about 2 months (around 02/20/2021).   Ortho Exam  Patient is alert, oriented, no adenopathy, well-dressed, normal affect, normal respiratory effort. Examination patient has healed abrasions on both knees he has crepitation with range of motion of both knees is tender to palpation over the medial and lateral joint line collaterals and cruciates are stable.  Imaging: No results found. No images are attached to the encounter.  Labs: Lab Results  Component Value Date   REPTSTATUS 10/23/2009 FINAL 10/21/2009   CULT NO GROWTH 10/21/2009     Lab Results  Component Value Date   ALBUMIN 4.1 10/22/2009   ALBUMIN 4.6 10/21/2009    No results found for: MG No results found for: VD25OH  No results found for: PREALBUMIN CBC EXTENDED Latest Ref Rng & Units 10/23/2009 10/23/2009 10/22/2009  WBC 4.0 - 10.5 K/uL 6.2 6.3 6.4  RBC 4.22 - 5.81 MIL/uL 3.21(L) 3.29(L) 3.30(L)  HGB 13.0 - 17.0 g/dL 10.4(L) 10.6(L) 10.7(L)  HCT 39.0 - 52.0 % 30.3(L) 31.1(L) 30.9(L)   PLT 150 - 400 K/uL 194 202 218  NEUTROABS 1.7 - 7.7 K/uL - - -  LYMPHSABS 0.7 - 4.0 K/uL - - -     There is no height or weight on file to calculate BMI.  Orders:  No orders of the defined types were placed in this encounter.  No orders of the defined types were placed in this encounter.    Procedures: Large Joint Inj: bilateral knee on 01/11/2021 12:35 PM Indications: pain and diagnostic evaluation Details: 22 G 1.5 in needle, anteromedial approach  Arthrogram: No  Outcome: tolerated well, no immediate complications Procedure, treatment alternatives, risks and benefits explained, specific risks discussed. Consent was given by the patient. Immediately prior to procedure a time out was called to verify the correct patient, procedure, equipment, support staff and site/side marked as required. Patient was prepped and draped in the usual sterile fashion.     Clinical Data: No additional findings.  ROS:  All other systems negative, except as noted in the HPI. Review of Systems  Objective: Vital Signs: There were no vitals taken for this visit.  Specialty Comments:  No specialty comments available.  PMFS History: Patient Active Problem List   Diagnosis Date Noted   Hypotension 02/20/2019   Chronic anticoagulation 03/05/2015   Abnormal chest x-ray with multiple lung nodules 12/14/2014  AS (aortic stenosis) 12/14/2014   Atrial flutter by electrocardiogram ([place]) 12/14/2014   CAD (coronary artery disease) 12/14/2014   DJD (degenerative joint disease) 12/14/2014   Essential hypertension 12/14/2014   History of GI bleed 12/14/2014   Nephrolithiasis 12/14/2014   Other activity(E029.9) 12/14/2014   Prediabetes 12/14/2014   Thrombocytopenia ([place]) 12/14/2014   Arterial blood pressure decreased 10/28/2014   Atrial fibrillation ([place]) 09/02/2014   Morbid obesity ([place]) 09/02/2014   AF (paroxysmal atrial fibrillation) ([place]) 08/03/2014   Gastroduodenal ulcer 08/03/2014    Apnea, sleep 08/03/2014   Glaucoma suspect 02/21/2013   Glaucoma suspect of both eyes 02/21/2013   Herpes 04/11/2012   Routine general medical examination at a health care facility 04/11/2012   DYSLIPIDEMIA 05/31/2009   DEPRESSION/ANXIETY 05/31/2009   ATTENTION DEFICIT DISORDER 05/31/2009   Obstructive sleep apnea 05/31/2009   GERD 05/31/2009   GOUT, HX OF 05/31/2009   Past Medical History:  Diagnosis Date   ADD (attention deficit disorder)    Asthma as child   Asymptomatic gallstones    Atrial fibrillation (HCC)    Atrial flutter (HCC)    Depression    DJD (degenerative joint disease) of knee    Dysrhythmia    Chronic Atrial Fib- seeing Dr. Lenna Sciara [facility]   H/O peptic ulcer    past history with GI bleed- cauterization done throught scope   History of kidney stones    past history-no problem now   Hypercholesterolemia    Orthostatic hypotension    Pneumonia as child   Prediabetes    none now   Pulmonary nodules    Sleep apnea    cpap set on 13   Thrombocytopenia (HCC)    pt denies   Thyromegaly    Varicose veins     Family History  Problem Relation Age of Onset   Heart disease Mother    Heart failure Mother    Heart disease Father    Heart attack Father    Stroke Neg Hx     Past Surgical History:  Procedure Laterality Date   BIOPSY  04/18/2018   Procedure: BIOPSY;  Surgeon: Ronnette Juniper, MD;  [facility];  Service: Gastroenterology;;   ESOPHAGOGASTRODUODENOSCOPY N/A 04/18/2018   Procedure: ESOPHAGOGASTRODUODENOSCOPY (EGD);  Surgeon: Ronnette Juniper, MD;  [facility];  Service: Gastroenterology;  Laterality: N/A;   ESOPHAGOGASTRODUODENOSCOPY (EGD) WITH PROPOFOL N/A 01/05/2015   Procedure: ESOPHAGOGASTRODUODENOSCOPY (EGD) WITH PROPOFOL;  Surgeon: Garlan Fair, MD;  [facility];  Service: Endoscopy;  Laterality: N/A;   GASTRIC ROUX-EN-Y     '08-[name] (weight stable around 302 #   KNEE ARTHROSCOPY     TONSILLECTOMY      ULNAR NERVE TRANSPOSITION Right 15 yrs ago   Social History   Occupational History   Not on file  Tobacco Use   Smoking status: Never   Smokeless tobacco: Never  Vaping Use   Vaping Use: Never used  Substance and Sexual Activity   Alcohol use: No    Alcohol/week: 0.0 standard drinks   Drug use: No   Sexual activity: Yes

## 2021-01-20 DIAGNOSIS — I10 Essential (primary) hypertension: Secondary | ICD-10-CM | POA: Diagnosis not present

## 2021-01-20 DIAGNOSIS — F411 Generalized anxiety disorder: Secondary | ICD-10-CM | POA: Diagnosis not present

## 2021-01-20 DIAGNOSIS — F410 Panic disorder [episodic paroxysmal anxiety] without agoraphobia: Secondary | ICD-10-CM | POA: Diagnosis not present

## 2021-01-20 DIAGNOSIS — F902 Attention-deficit hyperactivity disorder, combined type: Secondary | ICD-10-CM | POA: Diagnosis not present

## 2021-01-20 DIAGNOSIS — E7800 Pure hypercholesterolemia, unspecified: Secondary | ICD-10-CM | POA: Diagnosis not present

## 2021-01-20 DIAGNOSIS — F332 Major depressive disorder, recurrent severe without psychotic features: Secondary | ICD-10-CM | POA: Diagnosis not present

## 2021-01-28 DIAGNOSIS — R1084 Generalized abdominal pain: Secondary | ICD-10-CM | POA: Diagnosis not present

## 2021-01-28 DIAGNOSIS — Z87442 Personal history of urinary calculi: Secondary | ICD-10-CM | POA: Diagnosis not present

## 2021-01-28 DIAGNOSIS — N5201 Erectile dysfunction due to arterial insufficiency: Secondary | ICD-10-CM | POA: Diagnosis not present

## 2021-02-01 DIAGNOSIS — Z20822 Contact with and (suspected) exposure to covid-19: Secondary | ICD-10-CM | POA: Diagnosis not present

## 2021-02-03 DIAGNOSIS — F332 Major depressive disorder, recurrent severe without psychotic features: Secondary | ICD-10-CM | POA: Diagnosis not present

## 2021-02-03 DIAGNOSIS — F411 Generalized anxiety disorder: Secondary | ICD-10-CM | POA: Diagnosis not present

## 2021-02-03 DIAGNOSIS — F410 Panic disorder [episodic paroxysmal anxiety] without agoraphobia: Secondary | ICD-10-CM | POA: Diagnosis not present

## 2021-02-03 DIAGNOSIS — F902 Attention-deficit hyperactivity disorder, combined type: Secondary | ICD-10-CM | POA: Diagnosis not present

## 2021-02-04 DIAGNOSIS — N200 Calculus of kidney: Secondary | ICD-10-CM | POA: Diagnosis not present

## 2021-02-07 DIAGNOSIS — H401121 Primary open-angle glaucoma, left eye, mild stage: Secondary | ICD-10-CM | POA: Diagnosis not present

## 2021-02-07 DIAGNOSIS — H401111 Primary open-angle glaucoma, right eye, mild stage: Secondary | ICD-10-CM | POA: Diagnosis not present

## 2021-02-21 ENCOUNTER — Encounter: Payer: Self-pay | Admitting: Orthopedic Surgery

## 2021-02-21 ENCOUNTER — Other Ambulatory Visit: Payer: Self-pay

## 2021-02-21 ENCOUNTER — Ambulatory Visit (INDEPENDENT_AMBULATORY_CARE_PROVIDER_SITE_OTHER): Payer: Medicare Other | Admitting: Orthopedic Surgery

## 2021-02-21 VITALS — Ht 76.0 in | Wt 257.0 lb

## 2021-02-21 DIAGNOSIS — G8929 Other chronic pain: Secondary | ICD-10-CM

## 2021-02-21 DIAGNOSIS — M25561 Pain in right knee: Secondary | ICD-10-CM | POA: Diagnosis not present

## 2021-02-21 DIAGNOSIS — M25562 Pain in left knee: Secondary | ICD-10-CM | POA: Diagnosis not present

## 2021-02-21 NOTE — Progress Notes (Signed)
Office Visit Note   Patient: Francisco Padilla           Date of Birth: March 23, 1951           MRN: [id number] Visit Date: 02/21/2021              Requested by: Lajean Manes, MD [address] [place],  [place] [address] PCP: Lajean Manes, MD  Chief Complaint  Patient presents with   Right Knee - Follow-up, Pain   Left Knee - Follow-up, Pain      HPI: Patient is a 70 year old gentleman who presents in follow-up for osteoarthritis both knees he does complain of bowlegged deformity.  Patient states that with exercise at the [facility] with cycling pool therapy and weight therapy he is getting better.  Assessment & Plan: Visit Diagnoses:  1. Chronic pain of both knees     Plan: Discussed that he could try Voltaren gel on the knees topically as needed do not feel that this should affect his GI system.  Plan to follow-up if a injection is necessary or when he is ready to schedule surgery.  Discussed that he could follow-up with vascular vein surgery to see if any additional vascular procedures are necessary for the venous insufficiency.  Patient will need discharge to short-term skilled nursing.  Follow-Up Instructions: Return if symptoms worsen or fail to improve.   Ortho Exam  Patient is alert, oriented, no adenopathy, well-dressed, normal affect, normal respiratory effort. Examination patient has varus alignment of both knees there is minimal swelling he is wearing thigh-high compression stockings the swelling is worse in the left lower extremity than the right lower extremity secondary to the venous insufficiency.  Imaging: No results found. No images are attached to the encounter.  Labs: Lab Results  Component Value Date   REPTSTATUS 10/23/2009 FINAL 10/21/2009   CULT NO GROWTH 10/21/2009     Lab Results  Component Value Date   ALBUMIN 4.1 10/22/2009   ALBUMIN 4.6 10/21/2009    No results found for: MG No results found for: VD25OH  No results found for:  PREALBUMIN CBC EXTENDED Latest Ref Rng & Units 10/23/2009 10/23/2009 10/22/2009  WBC 4.0 - 10.5 K/uL 6.2 6.3 6.4  RBC 4.22 - 5.81 MIL/uL 3.21(L) 3.29(L) 3.30(L)  HGB 13.0 - 17.0 g/dL 10.4(L) 10.6(L) 10.7(L)  HCT 39.0 - 52.0 % 30.3(L) 31.1(L) 30.9(L)  PLT 150 - 400 K/uL 194 202 218  NEUTROABS 1.7 - 7.7 K/uL - - -  LYMPHSABS 0.7 - 4.0 K/uL - - -     Body mass index is 31.28 kg/m.  Orders:  No orders of the defined types were placed in this encounter.  No orders of the defined types were placed in this encounter.    Procedures: No procedures performed  Clinical Data: No additional findings.  ROS:  All other systems negative, except as noted in the HPI. Review of Systems  Objective: Vital Signs: Ht '6\' 4"'$  (1.93 m)   Wt 257 lb (116.6 kg)   BMI 31.28 kg/m   Specialty Comments:  No specialty comments available.  PMFS History: Patient Active Problem List   Diagnosis Date Noted   Hypotension 02/20/2019   Chronic anticoagulation 03/05/2015   Abnormal chest x-ray with multiple lung nodules 12/14/2014   AS (aortic stenosis) 12/14/2014   Atrial flutter by electrocardiogram ([place]) 12/14/2014   CAD (coronary artery disease) 12/14/2014   DJD (degenerative joint disease) 12/14/2014   Essential hypertension 12/14/2014   History of  GI bleed 12/14/2014   Nephrolithiasis 12/14/2014   Other activity(E029.9) 12/14/2014   Prediabetes 12/14/2014   Thrombocytopenia ([place]) 12/14/2014   Arterial blood pressure decreased 10/28/2014   Atrial fibrillation ([place]) 09/02/2014   Morbid obesity ([place]) 09/02/2014   AF (paroxysmal atrial fibrillation) ([place]) 08/03/2014   Gastroduodenal ulcer 08/03/2014   Apnea, sleep 08/03/2014   Glaucoma suspect 02/21/2013   Glaucoma suspect of both eyes 02/21/2013   Herpes 04/11/2012   Routine general medical examination at a health care facility 04/11/2012   DYSLIPIDEMIA 05/31/2009   DEPRESSION/ANXIETY 05/31/2009   ATTENTION DEFICIT DISORDER 05/31/2009    Obstructive sleep apnea 05/31/2009   GERD 05/31/2009   GOUT, HX OF 05/31/2009   Past Medical History:  Diagnosis Date   ADD (attention deficit disorder)    Asthma as child   Asymptomatic gallstones    Atrial fibrillation (HCC)    Atrial flutter (HCC)    Depression    DJD (degenerative joint disease) of knee    Dysrhythmia    Chronic Atrial Fib- seeing Dr. Lenna Sciara [facility]   H/O peptic ulcer    past history with GI bleed- cauterization done throught scope   History of kidney stones    past history-no problem now   Hypercholesterolemia    Orthostatic hypotension    Pneumonia as child   Prediabetes    none now   Pulmonary nodules    Sleep apnea    cpap set on 13   Thrombocytopenia (HCC)    pt denies   Thyromegaly    Varicose veins     Family History  Problem Relation Age of Onset   Heart disease Mother    Heart failure Mother    Heart disease Father    Heart attack Father    Stroke Neg Hx     Past Surgical History:  Procedure Laterality Date   BIOPSY  04/18/2018   Procedure: BIOPSY;  Surgeon: Ronnette Juniper, MD;  [facility];  Service: Gastroenterology;;   ESOPHAGOGASTRODUODENOSCOPY N/A 04/18/2018   Procedure: ESOPHAGOGASTRODUODENOSCOPY (EGD);  Surgeon: Ronnette Juniper, MD;  [facility];  Service: Gastroenterology;  Laterality: N/A;   ESOPHAGOGASTRODUODENOSCOPY (EGD) WITH PROPOFOL N/A 01/05/2015   Procedure: ESOPHAGOGASTRODUODENOSCOPY (EGD) WITH PROPOFOL;  Surgeon: Garlan Fair, MD;  [facility];  Service: Endoscopy;  Laterality: N/A;   GASTRIC ROUX-EN-Y     '08-[name] (weight stable around 302 #   KNEE ARTHROSCOPY     TONSILLECTOMY     ULNAR NERVE TRANSPOSITION Right 15 yrs ago   Social History   Occupational History   Not on file  Tobacco Use   Smoking status: Never   Smokeless tobacco: Never  Vaping Use   Vaping Use: Never used  Substance and Sexual Activity   Alcohol use: No    Alcohol/week: 0.0 standard drinks   Drug  use: No   Sexual activity: Yes

## 2021-02-24 DIAGNOSIS — F410 Panic disorder [episodic paroxysmal anxiety] without agoraphobia: Secondary | ICD-10-CM | POA: Diagnosis not present

## 2021-02-24 DIAGNOSIS — F332 Major depressive disorder, recurrent severe without psychotic features: Secondary | ICD-10-CM | POA: Diagnosis not present

## 2021-02-24 DIAGNOSIS — F411 Generalized anxiety disorder: Secondary | ICD-10-CM | POA: Diagnosis not present

## 2021-02-24 DIAGNOSIS — F902 Attention-deficit hyperactivity disorder, combined type: Secondary | ICD-10-CM | POA: Diagnosis not present

## 2021-03-03 DIAGNOSIS — F411 Generalized anxiety disorder: Secondary | ICD-10-CM | POA: Diagnosis not present

## 2021-03-03 DIAGNOSIS — F332 Major depressive disorder, recurrent severe without psychotic features: Secondary | ICD-10-CM | POA: Diagnosis not present

## 2021-03-03 DIAGNOSIS — F410 Panic disorder [episodic paroxysmal anxiety] without agoraphobia: Secondary | ICD-10-CM | POA: Diagnosis not present

## 2021-03-03 DIAGNOSIS — F902 Attention-deficit hyperactivity disorder, combined type: Secondary | ICD-10-CM | POA: Diagnosis not present

## 2021-03-10 DIAGNOSIS — F410 Panic disorder [episodic paroxysmal anxiety] without agoraphobia: Secondary | ICD-10-CM | POA: Diagnosis not present

## 2021-03-10 DIAGNOSIS — F332 Major depressive disorder, recurrent severe without psychotic features: Secondary | ICD-10-CM | POA: Diagnosis not present

## 2021-03-10 DIAGNOSIS — F902 Attention-deficit hyperactivity disorder, combined type: Secondary | ICD-10-CM | POA: Diagnosis not present

## 2021-03-10 DIAGNOSIS — F411 Generalized anxiety disorder: Secondary | ICD-10-CM | POA: Diagnosis not present

## 2021-03-13 IMAGING — RF DG ESOPHAGUS
2 series · 11 of 11 positions shown · non-contrast
Comparison: None.

CLINICAL DATA: Esophageal dysphagia, with sensation of food
sticking in throat and upper chest. Previous gastric bypass surgery.

EXAM:
ESOPHOGRAM / BARIUM SWALLOW / BARIUM TABLET STUDY
TECHNIQUE: Combined double contrast and single contrast examination performed
using effervescent crystals, thick barium liquid, and thin barium
liquid. The patient was observed with fluoroscopy swallowing a 13 mm
barium sulphate tablet.
FLUOROSCOPY TIME:  Fluoroscopy Time:  2 minutes 30 seconds
Radiation Exposure Index (if provided by the fluoroscopic device):
226 mGy
Number of Acquired Spot Images: 0

[Series 1: sequence · 4 of 21 frames shown]
[frame 4/21]
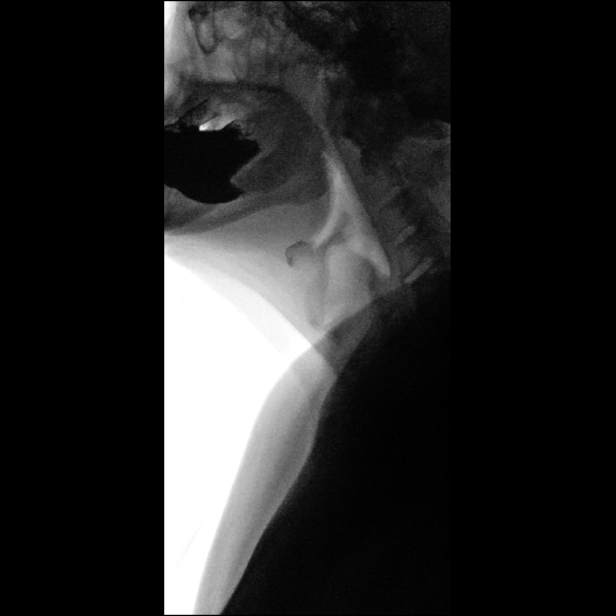
[frame 11/21]
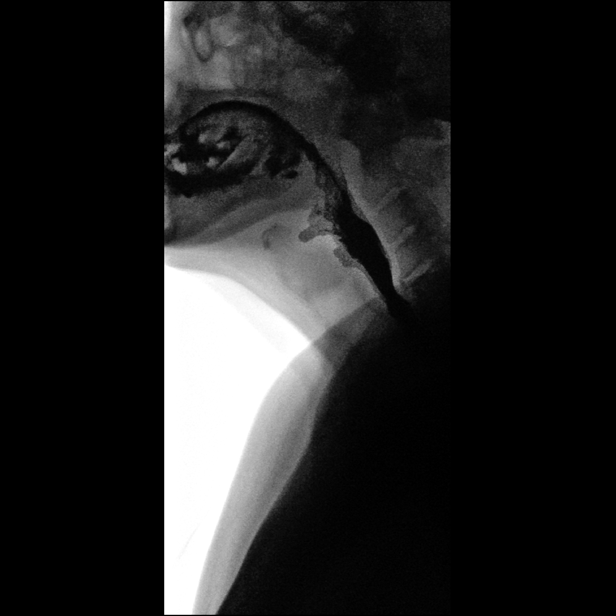
[frame 12/21]
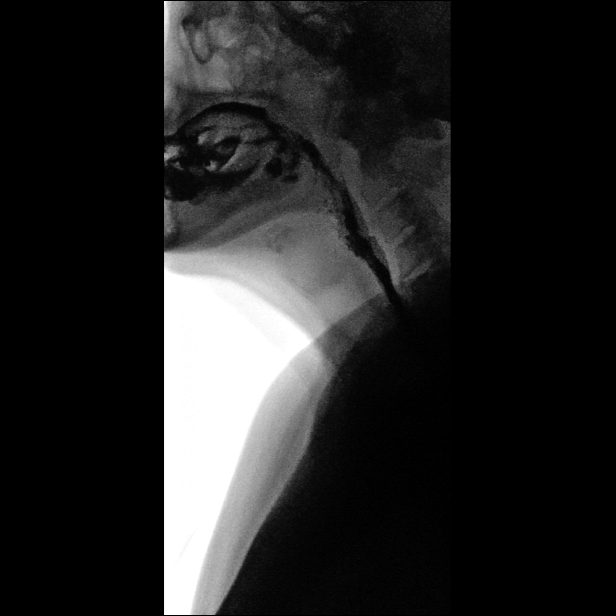
[frame 18/21]
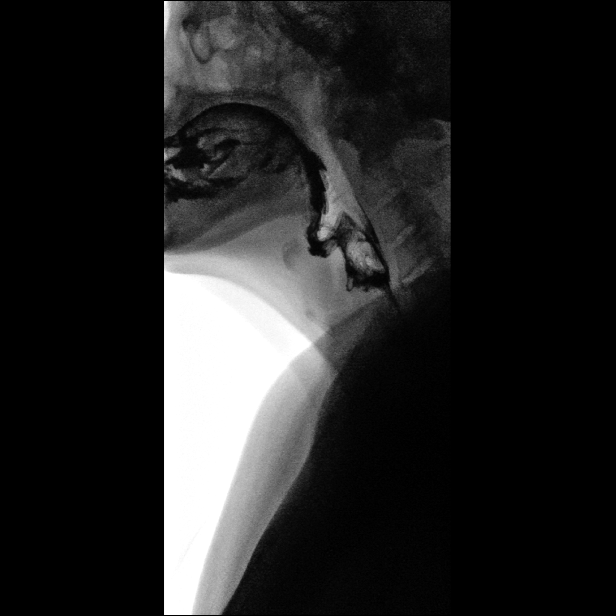

[Series 2: one shot · 7 of 7 slices shown]
[im 1/7]
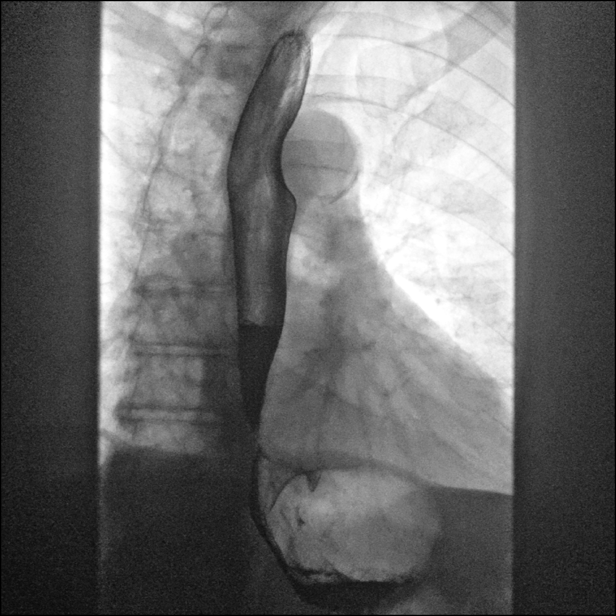
[im 2/7]
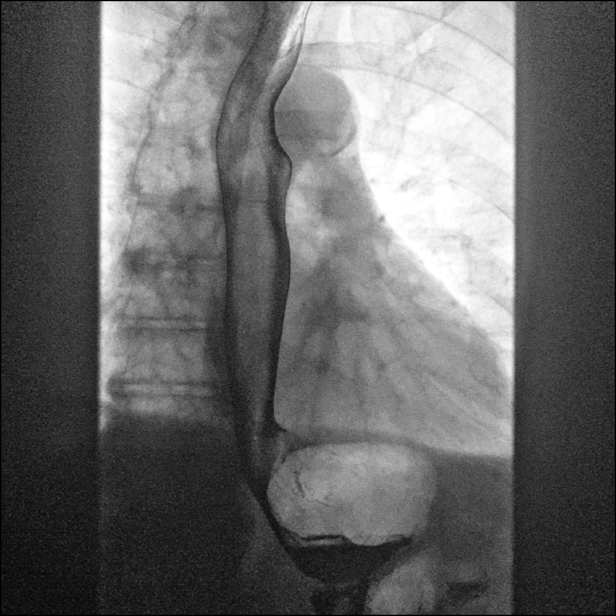
[im 3/7]
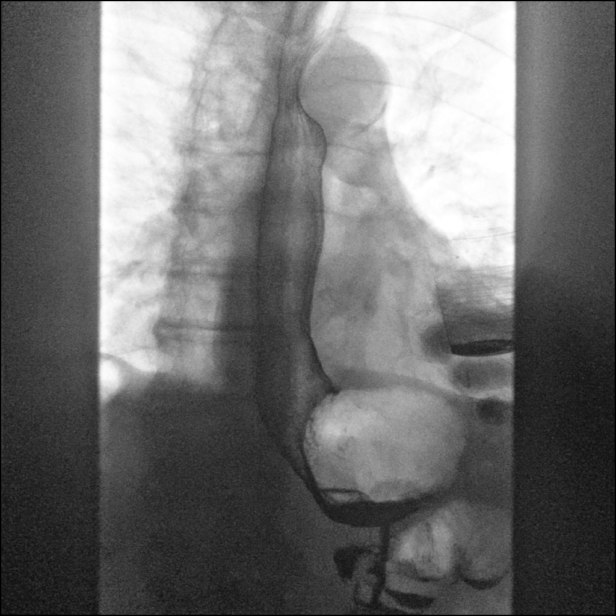
[im 4/7]
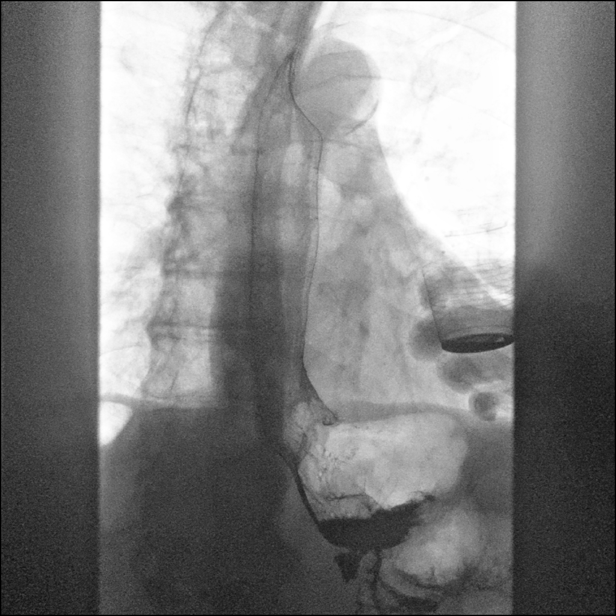
[im 5/7]
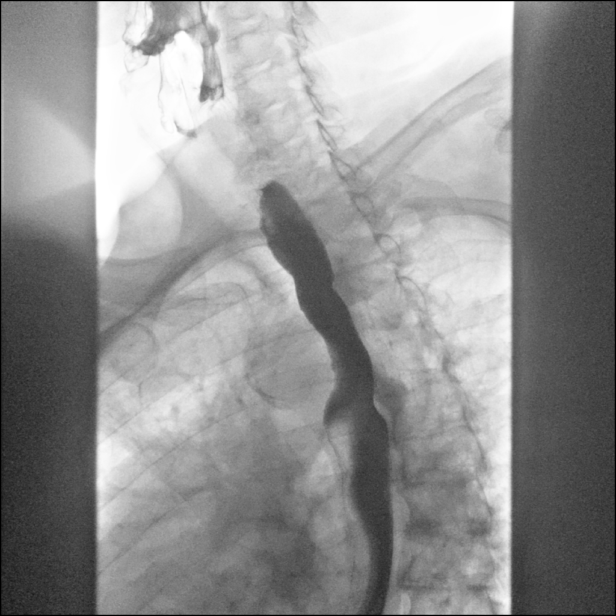
[im 6/7]
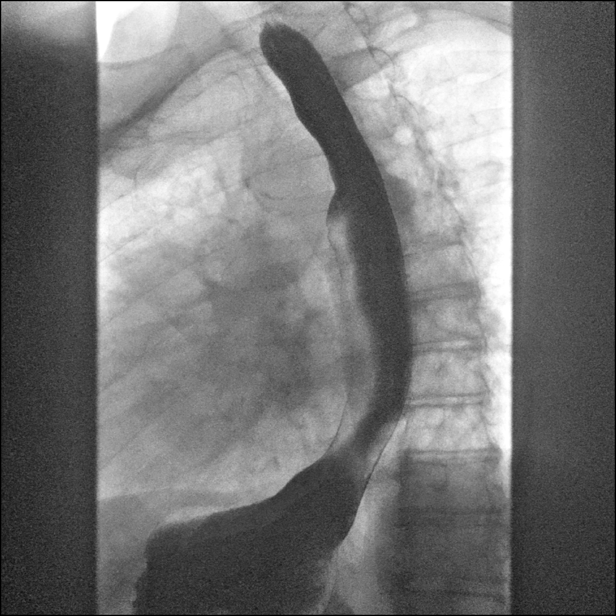
[im 7/7]
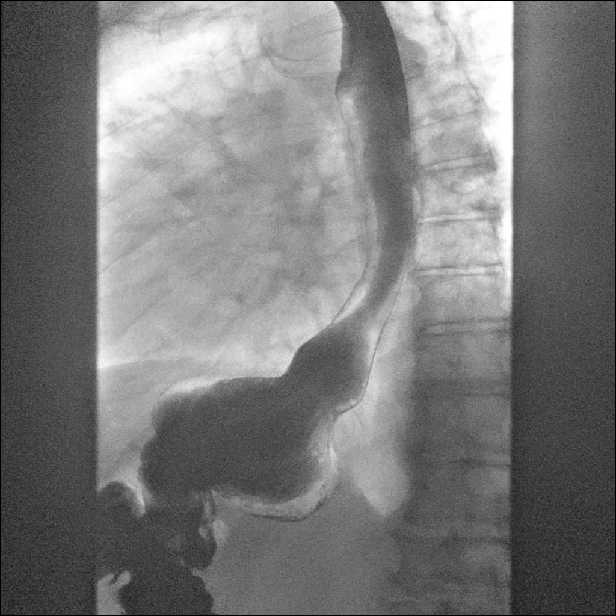

[11 of 11 positions shown; findings below may reference images not displayed]

FINDINGS: Swallowing appears normal. No evidence of vestibular penetration or
aspiration.

No evidence of esophageal mass or stricture. No findings of
esophagitis noted. Esophageal motility is within normal limits.

Postop changes from gastric bypass surgery noted. No evidence of
hiatal hernia or gastroesophgeal reflux. An ingested 13mm barium
tablet passed freely through the esophagus, and into the gastric
pouch.
IMPRESSION: Negative esophagram.

## 2021-03-14 DIAGNOSIS — Z0000 Encounter for general adult medical examination without abnormal findings: Secondary | ICD-10-CM | POA: Diagnosis not present

## 2021-03-14 DIAGNOSIS — E7800 Pure hypercholesterolemia, unspecified: Secondary | ICD-10-CM | POA: Diagnosis not present

## 2021-03-14 DIAGNOSIS — F411 Generalized anxiety disorder: Secondary | ICD-10-CM | POA: Diagnosis not present

## 2021-03-14 DIAGNOSIS — Z79899 Other long term (current) drug therapy: Secondary | ICD-10-CM | POA: Diagnosis not present

## 2021-03-14 DIAGNOSIS — G4733 Obstructive sleep apnea (adult) (pediatric): Secondary | ICD-10-CM | POA: Diagnosis not present

## 2021-03-14 DIAGNOSIS — G609 Hereditary and idiopathic neuropathy, unspecified: Secondary | ICD-10-CM | POA: Diagnosis not present

## 2021-03-14 DIAGNOSIS — F900 Attention-deficit hyperactivity disorder, predominantly inattentive type: Secondary | ICD-10-CM | POA: Diagnosis not present

## 2021-03-14 DIAGNOSIS — I482 Chronic atrial fibrillation, unspecified: Secondary | ICD-10-CM | POA: Diagnosis not present

## 2021-03-14 DIAGNOSIS — D6869 Other thrombophilia: Secondary | ICD-10-CM | POA: Diagnosis not present

## 2021-03-14 DIAGNOSIS — I10 Essential (primary) hypertension: Secondary | ICD-10-CM | POA: Diagnosis not present

## 2021-03-14 DIAGNOSIS — I350 Nonrheumatic aortic (valve) stenosis: Secondary | ICD-10-CM | POA: Diagnosis not present

## 2021-03-14 DIAGNOSIS — I700 Atherosclerosis of aorta: Secondary | ICD-10-CM | POA: Diagnosis not present

## 2021-03-14 DIAGNOSIS — Z1389 Encounter for screening for other disorder: Secondary | ICD-10-CM | POA: Diagnosis not present

## 2021-03-14 DIAGNOSIS — K2270 Barrett's esophagus without dysplasia: Secondary | ICD-10-CM | POA: Diagnosis not present

## 2021-03-23 DIAGNOSIS — N5201 Erectile dysfunction due to arterial insufficiency: Secondary | ICD-10-CM | POA: Diagnosis not present

## 2021-03-23 DIAGNOSIS — N400 Enlarged prostate without lower urinary tract symptoms: Secondary | ICD-10-CM | POA: Diagnosis not present

## 2021-03-23 DIAGNOSIS — N209 Urinary calculus, unspecified: Secondary | ICD-10-CM | POA: Diagnosis not present

## 2021-03-24 DIAGNOSIS — F411 Generalized anxiety disorder: Secondary | ICD-10-CM | POA: Diagnosis not present

## 2021-03-24 DIAGNOSIS — F332 Major depressive disorder, recurrent severe without psychotic features: Secondary | ICD-10-CM | POA: Diagnosis not present

## 2021-03-24 DIAGNOSIS — F410 Panic disorder [episodic paroxysmal anxiety] without agoraphobia: Secondary | ICD-10-CM | POA: Diagnosis not present

## 2021-03-30 DIAGNOSIS — F410 Panic disorder [episodic paroxysmal anxiety] without agoraphobia: Secondary | ICD-10-CM | POA: Diagnosis not present

## 2021-03-30 DIAGNOSIS — F411 Generalized anxiety disorder: Secondary | ICD-10-CM | POA: Diagnosis not present

## 2021-03-30 DIAGNOSIS — F332 Major depressive disorder, recurrent severe without psychotic features: Secondary | ICD-10-CM | POA: Diagnosis not present

## 2021-03-30 DIAGNOSIS — F902 Attention-deficit hyperactivity disorder, combined type: Secondary | ICD-10-CM | POA: Diagnosis not present

## 2021-04-07 DIAGNOSIS — F902 Attention-deficit hyperactivity disorder, combined type: Secondary | ICD-10-CM | POA: Diagnosis not present

## 2021-04-07 DIAGNOSIS — F411 Generalized anxiety disorder: Secondary | ICD-10-CM | POA: Diagnosis not present

## 2021-04-07 DIAGNOSIS — F332 Major depressive disorder, recurrent severe without psychotic features: Secondary | ICD-10-CM | POA: Diagnosis not present

## 2021-04-07 DIAGNOSIS — F410 Panic disorder [episodic paroxysmal anxiety] without agoraphobia: Secondary | ICD-10-CM | POA: Diagnosis not present

## 2021-04-13 DIAGNOSIS — F411 Generalized anxiety disorder: Secondary | ICD-10-CM | POA: Diagnosis not present

## 2021-04-13 DIAGNOSIS — F902 Attention-deficit hyperactivity disorder, combined type: Secondary | ICD-10-CM | POA: Diagnosis not present

## 2021-04-13 DIAGNOSIS — F410 Panic disorder [episodic paroxysmal anxiety] without agoraphobia: Secondary | ICD-10-CM | POA: Diagnosis not present

## 2021-04-13 DIAGNOSIS — F332 Major depressive disorder, recurrent severe without psychotic features: Secondary | ICD-10-CM | POA: Diagnosis not present

## 2021-04-15 ENCOUNTER — Telehealth: Payer: Self-pay | Admitting: Orthopedic Surgery

## 2021-04-15 NOTE — Telephone Encounter (Signed)
Patient called. He would like to go ahead with surgery. His call back number is [phone]

## 2021-04-18 NOTE — Telephone Encounter (Signed)
Please see message below. Wants to proceed with knee surgery will need blue sheet

## 2021-04-19 ENCOUNTER — Telehealth: Payer: Self-pay | Admitting: Orthopedic Surgery

## 2021-04-19 NOTE — Telephone Encounter (Signed)
Pt called wanting to go ahead with getting a knee replacement and he states both knees need to be done but whichever knee Dr.Duda thinks is worst off he can do first.Pt also states if Dr.Duda suggest doing both at the same time he would be ok with that. Pt also wanted to discuss being set up at a facility after surgery for recovery. Lastly the pt states he prefers [facility] but if there's anywhere Dr. Sharol Given prefers or can get him in sooner he would be fine with that.   [phone]

## 2021-04-19 NOTE — Telephone Encounter (Signed)
Please review this pt chart he needs bilateral knee replacement and states that whatever knee you decide to do first is fine and then Malachy Mood will need a blue sheet.

## 2021-04-20 NOTE — Telephone Encounter (Signed)
I called and sw pt to advise of below. He has cards appt next week and will seek clearance for this joint replacement surgery advised will call to schedule and call back with any other questions.

## 2021-04-21 DIAGNOSIS — B001 Herpesviral vesicular dermatitis: Secondary | ICD-10-CM | POA: Diagnosis not present

## 2021-04-21 DIAGNOSIS — F411 Generalized anxiety disorder: Secondary | ICD-10-CM | POA: Diagnosis not present

## 2021-04-21 DIAGNOSIS — F410 Panic disorder [episodic paroxysmal anxiety] without agoraphobia: Secondary | ICD-10-CM | POA: Diagnosis not present

## 2021-04-21 DIAGNOSIS — F332 Major depressive disorder, recurrent severe without psychotic features: Secondary | ICD-10-CM | POA: Diagnosis not present

## 2021-04-21 DIAGNOSIS — F902 Attention-deficit hyperactivity disorder, combined type: Secondary | ICD-10-CM | POA: Diagnosis not present

## 2021-04-26 ENCOUNTER — Other Ambulatory Visit: Payer: Self-pay

## 2021-04-26 ENCOUNTER — Ambulatory Visit (INDEPENDENT_AMBULATORY_CARE_PROVIDER_SITE_OTHER): Payer: Medicare Other | Admitting: Cardiology

## 2021-04-26 VITALS — BP 110/84 | HR 76 | Ht 76.0 in | Wt 227.0 lb

## 2021-04-26 DIAGNOSIS — Z7901 Long term (current) use of anticoagulants: Secondary | ICD-10-CM | POA: Diagnosis not present

## 2021-04-26 DIAGNOSIS — Z01810 Encounter for preprocedural cardiovascular examination: Secondary | ICD-10-CM

## 2021-04-26 DIAGNOSIS — I482 Chronic atrial fibrillation, unspecified: Secondary | ICD-10-CM | POA: Diagnosis not present

## 2021-04-26 DIAGNOSIS — I951 Orthostatic hypotension: Secondary | ICD-10-CM

## 2021-04-26 DIAGNOSIS — I4811 Longstanding persistent atrial fibrillation: Secondary | ICD-10-CM

## 2021-04-26 NOTE — Assessment & Plan Note (Signed)
He may proceed with knee replacement, vascular surgery with low overall cardiac risk.  Hold the Eliquis for 2 days prior to procedure and resume as soon as possible to help reduce overall stroke risk.  He is able to complete greater than 4 METS of activity without any difficulty.  Dr. Sharol Given, Dr. Oneida Alar.

## 2021-04-26 NOTE — Progress Notes (Signed)
Cardiology Office Note:    Date:  04/26/2021   ID:  Francisco Padilla, DOB [birth year], MRN [id number]  PCP:  Lajean Manes, MD  [facility] Cardiologist:  Candee Furbish, MD  [facility] Electrophysiologist:  None   Referring MD: Lajean Manes, MD   History of Present Illness:    Francisco Padilla is a 70 y.o. male here for the follow-up of atrial fibrillation, AS, coronary artery disease, and hypertension.  Has prior GI bleed Gastric bypass 2008 Obstructive sleep apnea Vein stripping Saw Dr. Thomasene Ripple at [place]. Started Eliquis. Dr. Deno Etienne as well.  Continuing with rate control.  Francisco Padilla is his husband, has had a stroke, 10 years ago.  Takes care of him. The 10-year Anu Stagner was hard emotionally for him.  Off of metoprolol.  Doing well.  No bleeding.  Syncope secondary to hypotension, on low-dose fludrocortisone.  Liberalize the salt. He had not had any major episodes in quite some time.  Today: Unfortunately, in September 2022 his husband, Francisco Padilla, passed away.  He shared with me his obituary.  He reports his left LE is still larger than his right due to edema.  He has seen Dr. Oneida Alar with vascular.  Prior to that he was seeing Dr. Donnetta Hutching.  For exercise he has been cycling regularly. He joined the [employer] this past June and is enjoying his workouts. Occasionally he feels an ache in his chest that he does not attribute to exercise. It may be due to stress.  He denies any palpitations, or shortness of breath. No lightheadedness, headaches, syncope, orthopnea, or PND. Also has no exertional symptoms. Endorses easy bleeding when it occurs.  Since his bariatric surgery, he has noticed that his abdominal skin has been increasingly less elastic and loose.  Past Medical History:  Diagnosis Date   ADD (attention deficit disorder)    Asthma as child   Asymptomatic gallstones    Atrial fibrillation (HCC)    Atrial flutter (HCC)    Depression    DJD (degenerative joint disease) of knee     Dysrhythmia    Chronic Atrial Fib- seeing Dr. Lenna [facility]   H/O peptic ulcer    past history with GI bleed- cauterization done throught scope   History of kidney stones    past history-no problem now   Hypercholesterolemia    Orthostatic hypotension    Pneumonia as child   Prediabetes    none now   Pulmonary nodules    Sleep apnea    cpap set on 13   Thrombocytopenia ([place])    pt denies   Thyromegaly    Varicose veins     Past Surgical History:  Procedure Laterality Date   BIOPSY  04/18/2018   Procedure: BIOPSY;  Surgeon: Ronnette Juniper, MD;  Location: [facility];  Service: Gastroenterology;;   ESOPHAGOGASTRODUODENOSCOPY N/A 04/18/2018   Procedure: ESOPHAGOGASTRODUODENOSCOPY (EGD);  Surgeon: Ronnette Juniper, MD;  Location: [facility];  Service: Gastroenterology;  Laterality: N/A;   ESOPHAGOGASTRODUODENOSCOPY (EGD) WITH PROPOFOL N/A 01/05/2015   Procedure: ESOPHAGOGASTRODUODENOSCOPY (EGD) WITH PROPOFOL;  Surgeon: Garlan Fair, MD;  Location: [facility] ENDOSCOPY;  Service: Endoscopy;  Laterality: N/A;   GASTRIC ROUX-EN-Y     '08-[name] (weight stable around 302 #   KNEE ARTHROSCOPY     TONSILLECTOMY     ULNAR NERVE TRANSPOSITION Right 15 yrs ago    Current Medications: Current Meds  Medication Sig   acetaminophen (TYLENOL) 500 MG tablet Take 500 mg by mouth every 6 (six) hours as  needed for moderate pain.   atorvastatin (LIPITOR) 20 MG tablet Take 20 mg by mouth daily.   Calcium Citrate-Vitamin D (CALCIUM CITRATE + D PO) Take 1 tablet by mouth daily.   DULoxetine (CYMBALTA) 30 MG capsule Take by mouth daily.   ELIQUIS 5 MG TABS tablet Take 5 mg by mouth 2 (two) times daily.    fludrocortisone (FLORINEF) 0.1 MG tablet Take 100 mcg by mouth daily.   gabapentin (NEURONTIN) 100 MG capsule Take 200 mg by mouth at bedtime.   latanoprost (XALATAN) 0.005 % ophthalmic solution Place 1 drop into both eyes at bedtime.   LORazepam (ATIVAN) 0.5 MG tablet Take 0.5 mg by mouth every 6  (six) hours as needed for anxiety.    Multiple Vitamins-Minerals (ONE DAILY MULTIVITAMIN MEN) TABS Take 1 tablet by mouth every evening.    pantoprazole (PROTONIX) 40 MG tablet Take 80 mg by mouth at bedtime.    pregabalin (LYRICA) 75 MG capsule Take 1 capsule (75 mg total) by mouth 2 (two) times daily.   sertraline (ZOLOFT) 50 MG tablet Take 50 mg by mouth daily.   zolpidem (AMBIEN) 10 MG tablet Take 10 mg by mouth at bedtime.    [DISCONTINUED] atorvastatin (LIPITOR) 40 MG tablet Take 20 mg by mouth at bedtime.      Allergies:   Aspirin, Benadryl [diphenhydramine hcl], Nsaids, and Oxycodone hcl   Social History   Socioeconomic History   Marital status: Single    Spouse name: Not on file   Number of children: Not on file   Years of education: Not on file   Highest education level: Not on file  Occupational History   Not on file  Tobacco Use   Smoking status: Never   Smokeless tobacco: Never  Vaping Use   Vaping Use: Never used  Substance and Sexual Activity   Alcohol use: No    Alcohol/week: 0.0 standard drinks   Drug use: No   Sexual activity: Yes  Other Topics Concern   Not on file  Social History Narrative   Not on file   Social Determinants of Health   Financial Resource Strain: Not on file  Food Insecurity: Not on file  Transportation Needs: Not on file  Physical Activity: Not on file  Stress: Not on file  Social Connections: Not on file     Family History: The patient's family history includes Heart attack in his father; Heart disease in his father and mother; Heart failure in his mother. There is no history of Stroke.  ROS:   Please see the history of present illness. (+) Left LE edema (+) Easy bleed All other systems are reviewed and negative.    EKGs/Labs/Other Studies Reviewed:    LE Venous Reflux 07/01/2019: Summary:  Right: No evidence of DVT, SVT, or Baker's cyst.  The sapheno-femoral junction is in competent.  The AAGSV is incompetent and  appears to give rise to the symptomatic  varicosities. The thigh segment of the GSV is non compressible in the  thigh but reconstitutes distally where it is incompetent.  No evidence of SSV reflux.  The common femoral vein is incompetent.  Left: No evidence of DVT, SVT, or Baker's cyst.  The sapheno-femoral junction is incompetent.  The GSV demonstrates reflux from the SFJ into the calf.  No evidence of SSV reflux.  The common femoral vein demonstrates reflux.  Left LE Doppler 05/02/2019: IMPRESSION: Negative for deep venous thrombosis.   Greater saphenous vein containing echogenic material compatible with thrombus.  Findings are suggestive of superficial thrombophlebitis.  EKG:  EKG is personally reviewed and interpreted.  04/26/2021: Atrial fibrillation. Rate 76 bpm. 06/29/2020: atrial fibrillation 86 with  Recent Labs: No results found for requested labs within last 8760 hours.  Recent Lipid Panel No results found for: CHOL, TRIG, HDL, CHOLHDL, VLDL, LDLCALC, LDLDIRECT   Physical Exam:     VS:  BP 110/84   Pulse 76   Ht 6\' 4"  (1.93 m)   Wt 227 lb (103 kg)   BMI 27.63 kg/m     Wt Readings from Last 3 Encounters:  04/26/21 227 lb (103 kg)  02/21/21 257 lb (116.6 kg)  06/29/20 257 lb (116.6 kg)     GEN: Well nourished, well developed in no acute distress HEENT: Normal NECK: No JVD; No carotid bruits LYMPHATICS: No lymphadenopathy CARDIAC: Irregularly irregular, no murmurs, rubs, gallops RESPIRATORY:  Clear to auscultation without rales, wheezing or rhonchi  ABDOMEN: Soft, non-tender, non-distended MUSCULOSKELETAL: Chronic lower extremity edema left greater than right, varicosities No deformity  SKIN: Warm and dry NEUROLOGIC:  Alert and oriented x 3 PSYCHIATRIC:  Normal affect   ASSESSMENT:    1. Chronic atrial fibrillation ([place])   2. Longstanding persistent atrial fibrillation ([place])   3. Orthostatic hypotension   4. Chronic anticoagulation   5. Preop  cardiovascular exam     PLAN:    In order of problems listed above: Longstanding persistent atrial fibrillation (HCC) Continue with good rate control.  He is not on an AV nodal blocking agent.  Heart rate today 76 bpm.  Overall doing well.  Continue with Eliquis.  Orthostatic hypotension Continue with low-dose fludrocortisone.  Prior potassium 3.9 creatinine 0.85.  Continue to liberalize salt intake, fluids.  Be very careful at the gym, [employer] when using the sauna.  He is felt a little bit "woozy ".  I would try to avoid.  Chronic anticoagulation Continue with Eliquis 5 mg twice a day.  Refills as needed for medical management chronic anticoagulation.  No bleeding complications.  He will obviously need to hold these medications prior to procedures for 2 days.  Preop cardiovascular exam He may proceed with knee replacement, vascular surgery with low overall cardiac risk.  Hold the Eliquis for 2 days prior to procedure and resume as soon as possible to help reduce overall stroke risk.  He is able to complete greater than 4 METS of activity without any difficulty.  Dr. Sharol Given, Dr. Oneida Alar.    1 year follow-up.  Medication Adjustments/Labs and Tests Ordered: Current medicines are reviewed at length with the patient today.  Concerns regarding medicines are outlined above.   Orders Placed This Encounter  Procedures   EKG 12-Lead    No orders of the defined types were placed in this encounter.  Patient Instructions  Medication Instructions:  The current medical regimen is effective;  continue present plan and medications.  *If you need a refill on your cardiac medications before your next appointment, please call your pharmacy*  Follow-Up: At [facility], you and your health needs are our priority.  As part of our continuing mission to provide you with exceptional heart care, we have created designated Provider Care Teams.  These Care Teams include your primary Cardiologist (physician)  and Advanced Practice Providers (APPs -  Physician Assistants and Nurse Practitioners) who all work together to provide you with the care you need, when you need it.  We recommend signing up for the patient portal called "MyChart".  Sign up information  is provided on this After Visit Summary.  MyChart is used to connect with patients for Virtual Visits (Telemedicine).  Patients are able to view lab/test results, encounter notes, upcoming appointments, etc.  Non-urgent messages can be sent to your provider as well.   To learn more about what you can do with MyChart, go to [email or web address].    Your next appointment:   1 year(s)  The format for your next appointment:   In Person  Provider:   Candee Furbish, MD   Thank you for choosing [facility]!!     I,Mathew Stumpf,acting as a scribe for Candee Furbish, MD.,have documented all relevant documentation on the behalf of Candee Furbish, MD,as directed by  Candee Furbish, MD while in the presence of Candee Furbish, MD.  I, Candee Furbish, MD, have reviewed all documentation for this visit. The documentation on 04/26/21 for the exam, diagnosis, procedures, and orders are all accurate and complete.   Signed, Candee Furbish, MD  04/26/2021 10:01 AM    [facility]

## 2021-04-26 NOTE — Patient Instructions (Signed)
Medication Instructions:  The current medical regimen is effective;  continue present plan and medications.  *If you need a refill on your cardiac medications before your next appointment, please call your pharmacy*  Follow-Up: At [facility], you and your health needs are our priority.  As part of our continuing mission to provide you with exceptional heart care, we have created designated Provider Care Teams.  These Care Teams include your primary Cardiologist (physician) and Advanced Practice Providers (APPs -  Physician Assistants and Nurse Practitioners) who all work together to provide you with the care you need, when you need it.  We recommend signing up for the patient portal called "MyChart".  Sign up information is provided on this After Visit Summary.  MyChart is used to connect with patients for Virtual Visits (Telemedicine).  Patients are able to view lab/test results, encounter notes, upcoming appointments, etc.  Non-urgent messages can be sent to your provider as well.   To learn more about what you can do with MyChart, go to [email or web address].    Your next appointment:   1 year(s)  The format for your next appointment:   In Person  Provider:   Mark Skains, MD   Thank you for choosing [facility]!!    

## 2021-04-26 NOTE — Assessment & Plan Note (Signed)
Continue with Eliquis 5 mg twice a day.  Refills as needed for medical management chronic anticoagulation.  No bleeding complications.  He will obviously need to hold these medications prior to procedures for 2 days.

## 2021-04-26 NOTE — Assessment & Plan Note (Signed)
Continue with good rate control.  He is not on an AV nodal blocking agent.  Heart rate today 76 bpm.  Overall doing well.  Continue with Eliquis.

## 2021-04-26 NOTE — Assessment & Plan Note (Signed)
Continue with low-dose fludrocortisone.  Prior potassium 3.9 creatinine 0.85.  Continue to liberalize salt intake, fluids.  Be very careful at the gym, [employer] when using the sauna.  He is felt a little bit "woozy ".  I would try to avoid.

## 2021-05-05 DIAGNOSIS — F902 Attention-deficit hyperactivity disorder, combined type: Secondary | ICD-10-CM | POA: Diagnosis not present

## 2021-05-05 DIAGNOSIS — F411 Generalized anxiety disorder: Secondary | ICD-10-CM | POA: Diagnosis not present

## 2021-05-05 DIAGNOSIS — F410 Panic disorder [episodic paroxysmal anxiety] without agoraphobia: Secondary | ICD-10-CM | POA: Diagnosis not present

## 2021-05-05 DIAGNOSIS — F332 Major depressive disorder, recurrent severe without psychotic features: Secondary | ICD-10-CM | POA: Diagnosis not present

## 2021-05-06 DIAGNOSIS — F331 Major depressive disorder, recurrent, moderate: Secondary | ICD-10-CM | POA: Diagnosis not present

## 2021-05-06 DIAGNOSIS — F411 Generalized anxiety disorder: Secondary | ICD-10-CM | POA: Diagnosis not present

## 2021-05-13 ENCOUNTER — Telehealth: Payer: Self-pay | Admitting: Orthopedic Surgery

## 2021-05-13 NOTE — Telephone Encounter (Signed)
Pt called asking for a CB to discuss when he may be getting scheduled for surgery.   [phone]

## 2021-05-17 ENCOUNTER — Telehealth: Payer: Self-pay | Admitting: Orthopedic Surgery

## 2021-05-17 NOTE — Telephone Encounter (Signed)
Pt called requesting a call back from Autumn F. Concerning his surgery date. Informed pt surgery scheduler was out last week and she is getting back to pt as soon as she can. Unsure of what he need from Autumn F. Please call pt at [phone].

## 2021-05-18 NOTE — Telephone Encounter (Signed)
This pt is sch for left total knee 06/03/21 please see below.

## 2021-05-19 ENCOUNTER — Ambulatory Visit: Payer: Medicare Other | Admitting: Orthopedic Surgery

## 2021-05-19 ENCOUNTER — Other Ambulatory Visit: Payer: Self-pay

## 2021-05-19 DIAGNOSIS — F332 Major depressive disorder, recurrent severe without psychotic features: Secondary | ICD-10-CM | POA: Diagnosis not present

## 2021-05-19 DIAGNOSIS — F410 Panic disorder [episodic paroxysmal anxiety] without agoraphobia: Secondary | ICD-10-CM | POA: Diagnosis not present

## 2021-05-19 DIAGNOSIS — F411 Generalized anxiety disorder: Secondary | ICD-10-CM | POA: Diagnosis not present

## 2021-05-19 DIAGNOSIS — F902 Attention-deficit hyperactivity disorder, combined type: Secondary | ICD-10-CM | POA: Diagnosis not present

## 2021-05-20 ENCOUNTER — Other Ambulatory Visit: Payer: Self-pay

## 2021-05-20 DIAGNOSIS — I83813 Varicose veins of bilateral lower extremities with pain: Secondary | ICD-10-CM

## 2021-05-25 DIAGNOSIS — I700 Atherosclerosis of aorta: Secondary | ICD-10-CM | POA: Diagnosis not present

## 2021-05-25 DIAGNOSIS — N209 Urinary calculus, unspecified: Secondary | ICD-10-CM | POA: Diagnosis not present

## 2021-05-25 DIAGNOSIS — K8020 Calculus of gallbladder without cholecystitis without obstruction: Secondary | ICD-10-CM | POA: Diagnosis not present

## 2021-05-25 DIAGNOSIS — N200 Calculus of kidney: Secondary | ICD-10-CM | POA: Diagnosis not present

## 2021-05-25 DIAGNOSIS — N522 Drug-induced erectile dysfunction: Secondary | ICD-10-CM | POA: Diagnosis not present

## 2021-05-25 DIAGNOSIS — K5730 Diverticulosis of large intestine without perforation or abscess without bleeding: Secondary | ICD-10-CM | POA: Diagnosis not present

## 2021-05-31 ENCOUNTER — Encounter (HOSPITAL_COMMUNITY)
Admission: RE | Admit: 2021-05-31 | Discharge: 2021-05-31 | Disposition: A | Discharge status: Home / Self Care | Payer: Medicare Other | Source: Ambulatory Visit | Attending: Orthopedic Surgery | Admitting: Orthopedic Surgery

## 2021-05-31 ENCOUNTER — Other Ambulatory Visit: Payer: Self-pay

## 2021-05-31 ENCOUNTER — Encounter (HOSPITAL_COMMUNITY): Payer: Self-pay

## 2021-05-31 VITALS — BP 128/92 | HR 82 | Temp 98.0°F | Resp 18 | Ht 75.0 in | Wt 234.1 lb

## 2021-05-31 DIAGNOSIS — E7800 Pure hypercholesterolemia, unspecified: Secondary | ICD-10-CM | POA: Diagnosis not present

## 2021-05-31 DIAGNOSIS — Z20822 Contact with and (suspected) exposure to covid-19: Secondary | ICD-10-CM | POA: Insufficient documentation

## 2021-05-31 DIAGNOSIS — I480 Paroxysmal atrial fibrillation: Secondary | ICD-10-CM

## 2021-05-31 DIAGNOSIS — Z01812 Encounter for preprocedural laboratory examination: Secondary | ICD-10-CM | POA: Insufficient documentation

## 2021-05-31 DIAGNOSIS — I4891 Unspecified atrial fibrillation: Secondary | ICD-10-CM | POA: Diagnosis not present

## 2021-05-31 DIAGNOSIS — Z01818 Encounter for other preprocedural examination: Secondary | ICD-10-CM

## 2021-05-31 LAB — BASIC METABOLIC PANEL
Anion gap: 9 (ref 5–15)
BUN: 28 mg/dL — ABNORMAL HIGH (ref 8–23)
CO2: 28 mmol/L (ref 22–32)
Calcium: 9.7 mg/dL (ref 8.9–10.3)
Chloride: 101 mmol/L (ref 98–111)
Creatinine, Ser: 0.79 mg/dL (ref 0.61–1.24)
GFR, Estimated: >60 mL/min (ref >60)
Glucose, Bld: 105 mg/dL — ABNORMAL HIGH (ref 70–99)
Potassium: 3.6 mmol/L (ref 3.5–5.1)
Sodium: 138 mmol/L (ref 135–145)

## 2021-05-31 LAB — SURGICAL PCR SCREEN
MRSA, PCR: NEGATIVE
Staphylococcus aureus: POSITIVE — AB

## 2021-05-31 LAB — CBC
HCT: 41.8 % (ref 39.0–52.0)
Hemoglobin: 14 g/dL (ref 13.0–17.0)
MCH: 30.8 pg (ref 26.0–34.0)
MCHC: 33.5 g/dL (ref 30.0–36.0)
MCV: 91.9 fL (ref 80.0–100.0)
Platelets: 188 10*3/uL (ref 150–400)
RBC: 4.55 MIL/uL (ref 4.22–5.81)
RDW: 13.2 % (ref 11.5–15.5)
WBC: 6.8 10*3/uL (ref 4.0–10.5)
nRBC: 0 % (ref 0.0–0.2)

## 2021-05-31 NOTE — Progress Notes (Signed)
PCP - Dr. Lajean Manes Cardiologist - Dr. Candee Furbish- clearance obtained  PPM/ICD - denies   Chest x-ray - 10/21/2009 EKG - 04/26/21 Stress Test - 07/11/06 ECHO - 09/11/14 Cardiac Cath - denies  Sleep Study - pt says he has had sleep apnea since he was a child CPAP - yes  DM- denies  Blood Thinner Instructions: pt instructed to hold Eliquis 11/9 Aspirin Instructions: n/a  ERAS Protcol - yes PRE-SURGERY Ensure- given  COVID TEST- 05/31/21 at PAT   Anesthesia review: yes, cardiac hx  Patient denies shortness of breath, fever, cough and chest pain at PAT appointment   All instructions explained to the patient, with a verbal understanding of the material. Patient agrees to go over the instructions while at home for a better understanding. Patient also instructed to wear a mask in public after being tested for COVID-19. The opportunity to ask questions was provided.

## 2021-05-31 NOTE — Pre-Procedure Instructions (Addendum)
Surgical Instructions    Your procedure is scheduled on Friday, November 11th.  Report to [facility] Main Entrance "A" at 05:30 A.M., then check in with the Admitting office.  Call this number if you have problems the morning of surgery:  [phone]   If you have any questions prior to your surgery date call [phone]: Open Monday-Friday 8am-4pm    Remember:  Do not eat after midnight the night before your surgery  You may drink clear liquids until 04:30 AM the morning of your surgery.   Clear liquids allowed are: Water, Non-Citrus Juices (without pulp), Carbonated Beverages, Clear Tea, Black Coffee Only, and Gatorade  Patient Instructions  The night before surgery:  No food after midnight. ONLY clear liquids after midnight  The day of surgery (if you do NOT have diabetes):  Drink ONE (1) Pre-Surgery Clear Ensure by 04:30 AM the morning of surgery. Drink in one sitting. Do not sip.  This drink was given to you during your hospital  pre-op appointment visit.  Nothing else to drink after completing the  Pre-Surgery Clear Ensure.         If you have questions, please contact your surgeon's office.     Take these medicines the morning of surgery with A SIP OF WATER  fludrocortisone (FLORINEF)  LORazepam (ATIVAN)   If needed: acetaminophen (TYLENOL) Polyethyl Glycol-Propyl Glycol (SYSTANE OP)  Follow your surgeon's instructions about when to stop taking Eliquis. If no instructions were given, contact your surgeon.    As of today, STOP taking any Aspirin (unless otherwise instructed by your surgeon) Aleve, Naproxen, Ibuprofen, Motrin, Advil, Goody's, BC's, all herbal medications, fish oil, and all vitamins.                     Do NOT Smoke (Tobacco/Vaping) or drink Alcohol 24 hours prior to your procedure.  If you use a CPAP at night, you may bring all equipment for your overnight stay.   Contacts, glasses, piercing's, hearing aid's, dentures or partials may not be  worn into surgery, please bring cases for these belongings.    For patients admitted to the hospital, discharge time will be determined by your treatment team.   Patients discharged the day of surgery will not be allowed to drive home, and someone needs to stay with them for 24 hours.  NO VISITORS WILL BE ALLOWED IN PRE-OP WHERE PATIENTS GET READY FOR SURGERY.  ONLY 1 SUPPORT PERSON MAY BE PRESENT IN THE WAITING ROOM WHILE YOU ARE IN SURGERY.  IF YOU ARE TO BE ADMITTED, ONCE YOU ARE IN YOUR ROOM YOU WILL BE ALLOWED TWO (2) VISITORS.  Minor children may have two parents present. Special consideration for safety and communication needs will be reviewed on a case by case basis.   Special instructions:   [facility]- Preparing For Surgery  Before surgery, you can play an important role. Because skin is not sterile, your skin needs to be as free of germs as possible. You can reduce the number of germs on your skin by washing with CHG (chlorahexidine gluconate) Soap before surgery.  CHG is an antiseptic cleaner which kills germs and bonds with the skin to continue killing germs even after washing.    Oral Hygiene is also important to reduce your risk of infection.  Remember - BRUSH YOUR TEETH THE MORNING OF SURGERY WITH YOUR REGULAR TOOTHPASTE  Please do not use if you have an allergy to CHG or antibacterial soaps. If your skin  becomes reddened/irritated stop using the CHG.  Do not shave (including legs and underarms) for at least 48 hours prior to first CHG shower. It is OK to shave your face.  Please follow these instructions carefully.   Shower the NIGHT BEFORE SURGERY and the MORNING OF SURGERY  If you chose to wash your hair, wash your hair first as usual with your normal shampoo.  After you shampoo, rinse your hair and body thoroughly to remove the shampoo.  Use CHG Soap as you would any other liquid soap. You can apply CHG directly to the skin and wash gently with a scrungie or a clean  washcloth.   Apply the CHG Soap to your body ONLY FROM THE NECK DOWN.  Do not use on open wounds or open sores. Avoid contact with your eyes, ears, mouth and genitals (private parts). Wash Face and genitals (private parts)  with your normal soap.   Wash thoroughly, paying special attention to the area where your surgery will be performed.  Thoroughly rinse your body with warm water from the neck down.  DO NOT shower/wash with your normal soap after using and rinsing off the CHG Soap.  Pat yourself dry with a CLEAN TOWEL.  Wear CLEAN PAJAMAS to bed the night before surgery  Place CLEAN SHEETS on your bed the night before your surgery  DO NOT SLEEP WITH PETS.   Day of Surgery: Shower with CHG soap. Do not wear jewelry Do not wear lotions, powders, colognes, or deodorant. Men may shave face and neck. Do not bring valuables to the hospital. [facility] is not responsible for any belongings or valuables. Wear Clean/Comfortable clothing the morning of surgery Remember to brush your teeth WITH YOUR REGULAR TOOTHPASTE.   Please read over the following fact sheets that you were given.   3 days prior to your procedure or After your COVID test   You are not required to quarantine however you are required to wear a well-fitting mask when you are out and around people not in your household. If your mask becomes wet or soiled, replace with a new one.   Wash your hands often with soap and water for 20 seconds or clean your hands with an alcohol-based hand sanitizer that contains at least 60% alcohol.   Do not share personal items.   Notify your provider:  o if you are in close contact with someone who has COVID  o or if you develop a fever of 100.4 or greater, sneezing, cough, sore throat, shortness of breath or body aches.

## 2021-06-01 ENCOUNTER — Ambulatory Visit (HOSPITAL_COMMUNITY)
Admission: RE | Admit: 2021-06-01 | Discharge: 2021-06-01 | Disposition: A | Discharge status: Home / Self Care | Payer: Medicare Other | Source: Ambulatory Visit | Attending: Vascular Surgery | Admitting: Vascular Surgery

## 2021-06-01 ENCOUNTER — Telehealth: Payer: Self-pay | Admitting: Orthopedic Surgery

## 2021-06-01 ENCOUNTER — Encounter: Payer: Self-pay | Admitting: Vascular Surgery

## 2021-06-01 ENCOUNTER — Telehealth: Payer: Self-pay | Admitting: *Deleted

## 2021-06-01 ENCOUNTER — Ambulatory Visit (INDEPENDENT_AMBULATORY_CARE_PROVIDER_SITE_OTHER): Payer: Medicare Other | Admitting: Vascular Surgery

## 2021-06-01 VITALS — BP 115/84 | HR 93 | Temp 98.2°F | Resp 20 | Ht 75.0 in | Wt 227.8 lb

## 2021-06-01 DIAGNOSIS — I83813 Varicose veins of bilateral lower extremities with pain: Secondary | ICD-10-CM | POA: Diagnosis not present

## 2021-06-01 LAB — SARS CORONAVIRUS 2 (TAT 6-24 HRS): SARS Coronavirus 2: NEGATIVE

## 2021-06-01 NOTE — Telephone Encounter (Signed)
Pt called and he need to speak to Dr. Sharol Given or Autumn F. As soon possible. Pt concerns of his facility he will be staying at after surgery. He received a concerning phone call and need to know. His surgery Friday and this done taken care of of tomorrow please. Pt is going to call 8 am and ask for me. Pt phone number is [phone]. Pt is asking to stay at [facility].

## 2021-06-01 NOTE — Progress Notes (Signed)
Anesthesia Chart Review:  Case: [id number] Date/Time: 06/03/21 0715   Procedure: LEFT TOTAL KNEE ARTHROPLASTY (Left: Knee)   Anesthesia type: Choice   Pre-op diagnosis: Osteoarthritis Left Knee   Location: [facility] OR ROOM 05 / [facility]   Surgeons: Newt Minion, MD       DISCUSSION: Patient is a 70 year old male scheduled for the above procedure.   History includes never smoker, hypercholesterolemia, afib/aflutter (new onset 04/2009), prediabetes (history of), pulmonary nodules (12/12/06 chest CTA, stable 01/20/11 chest CT and felt c/w benign etiology), OSA (uses CPAP), PUD (s/p endo clip anastomotic gastric ulcer 10/22/09 and on 12/15/12 for small bowel ulcer), left pneumothorax (following fall from ladder 05/20/09), thyromegaly, orthostatic hypotension (on Florinef), ADD, varicose veins (right GSV laser ablation 11/25/08, left GSV laser ablation 11/11/08 with recanalization). 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  He had mild intermittent thrombocytopenia in 2011, 2014.  Cardiology visit with preoperative evaluation by Dr. Marlou Porch on 04/26/21. Afib with good rate control without AV nodal blocking agent. Tolerating Eliquis. Note mentions patient's husband passed away recently in Apr 16, 2021. One year follow-up planned. For preoperative evaluation he wrote, "Preop cardiovascular exam He may proceed with knee replacement, vascular surgery with low overall cardiac risk.  Hold the Eliquis for 2 days prior to procedure and resume as soon as possible to help reduce overall stroke risk.  He is able to complete greater than 4 METS of activity without any difficulty."  Patient is on Elqius 5 mg BID for afib. Cardiology had requested to only hold for two days for surgery. Chart for anesthesia review on 06/01/21, and this is documented as start of Elqius hold for surgery. Anesthesia type is posted as Choice. Anesthesia team to evaluate on the day of surgery. He is on Florinef for orthostatic hypotension.  Presurgical COVID-19 test  negative on 05/31/21.   VS: BP (!) 128/92   Pulse 82   Temp 36.7 C (Oral)   Resp 18   Ht 6\' 3"  (1.905 m)   Wt 106.2 kg   SpO2 100%   BMI 29.26 kg/m    PROVIDERS: Lajean Manes, MD is PCP  - Candee Furbish, MD is cardiologist. He [facility] EP evaluation by Gabriel Carina, MD 06/11/15 and Orion Crook, MD on 12/16/14.  Ruta Hinds, MD (now retired) was Hydrographic surveyor. Last visit 06/23/20 for recanalization of left GSV. Patient was caring for a family member at that time, and opted for conservative management with compression stockings and may consider procedural intervention in the future. Six month follow-up recommended.    LABS: Labs reviewed: Acceptable for surgery. Last A1c seen 5.7% 07/08/19 ([place]). Random glucose 105 05/31/21.  (all labs ordered are listed, but only abnormal results are displayed)  Labs Reviewed  SURGICAL PCR SCREEN - Abnormal; Notable for the following components:      Result Value   Staphylococcus aureus POSITIVE (*)    All other components within normal limits  BASIC METABOLIC PANEL - Abnormal; Notable for the following components:   Glucose, Bld 105 (*)    BUN 28 (*)    All other components within normal limits  SARS CORONAVIRUS 2 (TAT 6-24 HRS)  CBC    EKG: 04/26/21: Afib at 76 bpm   CV: Echo 09/11/14: Study Conclusions  - Left ventricle: The cavity size was normal. Wall thickness was    normal. Systolic function was normal. The estimated ejection    fraction was in the range of 50% to 55%. Wall motion was normal;  there were no regional wall motion abnormalities.  - Left atrium: The atrium was severely dilated.  - Atrial septum: No defect or patent foramen ovale was identified.   Remote history of stress echo 07/11/06.   Past Medical History:  Diagnosis Date   ADD (attention deficit disorder)    Anxiety 2007   Asthma as child   Asymptomatic gallstones    Atrial fibrillation (HCC)    Atrial flutter  (HCC)    Depression    DJD (degenerative joint disease) of knee    Dysrhythmia    Chronic Atrial Fib- seeing Dr. Lenna Sciara [facility]   H/O peptic ulcer    past history with GI bleed- cauterization done throught scope   History of kidney stones    pt claims urology discovered a kidney stone last week 05/25/21   Hypercholesterolemia    Orthostatic hypotension    Pneumonia as child   Pneumothorax on left 05/20/2009   Prediabetes    none now   Pulmonary nodules    Sleep apnea    cpap set on 13   Thrombocytopenia ([place])    pt denies   Thyromegaly    Varicose veins     Past Surgical History:  Procedure Laterality Date   BIOPSY  04/18/2018   Procedure: BIOPSY;  Surgeon: Ronnette Juniper, MD;  Location: [facility];  Service: Gastroenterology;;   ESOPHAGOGASTRODUODENOSCOPY N/A 04/18/2018   Procedure: ESOPHAGOGASTRODUODENOSCOPY (EGD);  Surgeon: Ronnette Juniper, MD;  Location: [facility];  Service: Gastroenterology;  Laterality: N/A;   ESOPHAGOGASTRODUODENOSCOPY (EGD) WITH PROPOFOL N/A 01/05/2015   Procedure: ESOPHAGOGASTRODUODENOSCOPY (EGD) WITH PROPOFOL;  Surgeon: Garlan Fair, MD;  Location: [facility]OS[facility];  Service: Endoscopy;  Laterality: N/A;   GASTRIC ROUX-EN-Y     '08-[name] (weight stable around 302 #   KNEE ARTHROSCOPY     TONSILLECTOMY     ULNAR NERVE TRANSPOSITION Right 15 yrs ago    MEDICATIONS:  acetaminophen (TYLENOL) 650 MG CR tablet   atorvastatin (LIPITOR) 20 MG tablet   Calcium Citrate-Vitamin D (CALCIUM CITRATE + D PO)   Cyanocobalamin (B-12 SL)   diphenhydrAMINE (BENADRYL) 25 MG tablet   ELIQUIS 5 MG TABS tablet   fludrocortisone (FLORINEF) 0.1 MG tablet   gabapentin (NEURONTIN) 100 MG capsule   latanoprost (XALATAN) 0.005 % ophthalmic solution   LORazepam (ATIVAN) 1 MG tablet   Multiple Vitamins-Minerals (ONE DAILY MULTIVITAMIN MEN) TABS   pantoprazole (PROTONIX) 40 MG tablet   Polyethyl Glycol-Propyl Glycol (SYSTANE OP)   Probiotic Product (PROBIOTIC PO)    sertraline (ZOLOFT) 100 MG tablet   SUPER B COMPLEX/C PO   zolpidem (AMBIEN) 10 MG tablet   No current facility-administered medications for this encounter.    Myra Gianotti, PA-C Surgical Short Stay/Anesthesiology [facility] Phone [phone] [facility] Phone [phone] 06/01/2021 10:40 AM

## 2021-06-01 NOTE — Telephone Encounter (Signed)
RNCM call to patient to speak to him regarding his upcoming Left total knee arthroplasty with Dr. Sharol Given on this Friday, 06/03/21. Patient is an Ortho bundle patient through [facility]. He, at first, was agreeable to case management. After a lengthy conversation regarding post op needs and discharge plan, the patient informed he had no one to help him at home as his husband has recently passed away and no other family to assist. I explained that he could go to a SNF rehab for STR after surgery and he states he has been talking with someone at [facility] in [facility] and he would be going there for his rehab. Explained that to best of CM's knowledge, this is an Inpatient Rehab facility such as [facility], and a diagnosis of a single joint replacement does not always meet criteria for inpatient rehab hospitals. He became extremely frustrated during the conversation and began stating that he wouldn't have surgery if he couldn't go there for rehab. "Where am I supposed to go?" CM attempted to explain multiple times that patients usually go to a SNF for STR and he began screaming that I was just repeating myself. I informed I would reach out to an admission liaison there at [facility] to see if they have spoken to him about his pending surgery and desire to go to their facility. I was able to speak with an admission's coordinator, who confirmed a single joint replacement typically does not meet requirements for admission to their facility. Explained that patient is under impression he will be coming to their facility after his surgery. I attempted to reach back out to the patient, who answered the phone while at another facility. I asked if I could call him back when he was done and he screamed, "Am I having surgery or not". I did request he speak more respectfully as this CM is attempted to help him with his discharge needs. Patient then asked if CM would contact him back in a very different voice (sing-songy)  asking that CM call him back in 10 minutes. CM did attempt to contact patient back, but received his VM. Left VM on his cell number stating he would have to reach out to [facility] if he would like to confirm if he can go there after surgery. CM will not be following patient at this time as a bundle as this would not be a respectful working relationship. Patient updated that CM will not be contacting him again prior to surgery. Dr. Sharol Given is aware.

## 2021-06-01 NOTE — Progress Notes (Signed)
Patient ID: Francisco Padilla, male   DOB: [birth year], 70 y.o.   MRN: [id number]  Reason for Consult: No chief complaint on file.   Referred by Lajean Manes, MD  Subjective:     HPI:  Francisco Padilla is a 70 y.o. male has a history of recanalization of his left greater saphenous vein after previous laser ablation.  He also has treatment of his right greater saphenous vein in the past.  He does have degenerative joint disease of both knees and is currently scheduled for knee replacement on Friday of this week.  He does wear thigh-high compression stockings he has been very consistent with this.  He was previously taking care of his husband who recently passed away and recently had a funeral.  He has never had any known blood clots.  He does have a strong family history of varicosities including in his mother who had multiple procedures for stripping.  He has known atrial fibrillation.  He does have heaviness and pain of his bilateral legs particularly below the knees and tenderness of his varicosities.  He also has associated ankle swelling bilaterally.  He walks without assistance.  He is on Eliquis for atrial fibrillation.  Past Medical History:  Diagnosis Date   ADD (attention deficit disorder)    Anxiety 2007   Asthma as child   Asymptomatic gallstones    Atrial fibrillation (HCC)    Atrial flutter (HCC)    Depression    DJD (degenerative joint disease) of knee    Dysrhythmia    Chronic Atrial Fib- seeing Dr. Lenna Sciara [facility]   H/O peptic ulcer    past history with GI bleed- cauterization done throught scope   History of kidney stones    pt claims urology discovered a kidney stone last week 05/25/21   Hypercholesterolemia    Orthostatic hypotension    Pneumonia as child   Pneumothorax on left 05/20/2009   Prediabetes    none now   Pulmonary nodules    Sleep apnea    cpap set on 13   Thrombocytopenia (HCC)    pt denies   Thyromegaly    Varicose veins    Family History   Problem Relation Age of Onset   Heart disease Mother    Heart failure Mother    Heart disease Father    Heart attack Father    Stroke Neg Hx    Past Surgical History:  Procedure Laterality Date   BIOPSY  04/18/2018   Procedure: BIOPSY;  Surgeon: Ronnette Juniper, MD;  Location: [facility];  Service: Gastroenterology;;   ESOPHAGOGASTRODUODENOSCOPY N/A 04/18/2018   Procedure: ESOPHAGOGASTRODUODENOSCOPY (EGD);  Surgeon: Ronnette Juniper, MD;  Location: [facility];  Service: Gastroenterology;  Laterality: N/A;   ESOPHAGOGASTRODUODENOSCOPY (EGD) WITH PROPOFOL N/A 01/05/2015   Procedure: ESOPHAGOGASTRODUODENOSCOPY (EGD) WITH PROPOFOL;  Surgeon: Garlan Fair, MD;  Location: [facility];  Service: Endoscopy;  Laterality: N/A;   GASTRIC ROUX-EN-Y     '08-[name] (weight stable around 302 #   KNEE ARTHROSCOPY     TONSILLECTOMY     ULNAR NERVE TRANSPOSITION Right 15 yrs ago    Short Social History:  Social History   Tobacco Use   Smoking status: Never   Smokeless tobacco: Never  Substance Use Topics   Alcohol use: No    Alcohol/week: 0.0 standard drinks    Allergies  Allergen Reactions   Aspirin     Avoid due to bariatric surgery   Nsaids     Avoid  due to bariatric surgery   Oxycodone Hcl     Hallucinations, agitation     Current Outpatient Medications  Medication Sig Dispense Refill   acetaminophen (TYLENOL) 650 MG CR tablet Take 1,300 mg by mouth every 8 (eight) hours as needed for pain.     atorvastatin (LIPITOR) 20 MG tablet Take 20 mg by mouth at bedtime.     Calcium Citrate-Vitamin D (CALCIUM CITRATE + D PO) Take 1 tablet by mouth 2 (two) times daily.     Cyanocobalamin (B-12 SL) Place 1 tablet under the tongue daily.     diphenhydrAMINE (BENADRYL) 25 MG tablet Take 12.5-25 mg by mouth at bedtime.     ELIQUIS 5 MG TABS tablet Take 5 mg by mouth 2 (two) times daily.   0   fludrocortisone (FLORINEF) 0.1 MG tablet Take 0.1 mg by mouth daily.     gabapentin (NEURONTIN) 100 MG  capsule Take 200 mg by mouth at bedtime.     latanoprost (XALATAN) 0.005 % ophthalmic solution Place 1 drop into both eyes at bedtime.     LORazepam (ATIVAN) 1 MG tablet Take 0.5 mg by mouth every 4 (four) hours.     Multiple Vitamins-Minerals (ONE DAILY MULTIVITAMIN MEN) TABS Take 1 tablet by mouth 2 (two) times daily.     pantoprazole (PROTONIX) 40 MG tablet Take 80 mg by mouth at bedtime.      Polyethyl Glycol-Propyl Glycol (SYSTANE OP) Place 1 drop into both eyes daily as needed (dry eyes).     Probiotic Product (PROBIOTIC PO) Take 1 capsule by mouth daily.     sertraline (ZOLOFT) 100 MG tablet Take 100 mg by mouth at bedtime.     SUPER B COMPLEX/C PO Take 1 tablet by mouth daily.     zolpidem (AMBIEN) 10 MG tablet Take 5-10 mg by mouth at bedtime.  0   No current facility-administered medications for this visit.    Review of Systems  Constitutional:  Constitutional negative. HENT: HENT negative.  Eyes: Eyes negative.  Respiratory: Respiratory negative.  Cardiovascular: Positive for leg swelling.  GI: Gastrointestinal negative.  Musculoskeletal: Positive for leg pain and joint pain.  Skin: Skin negative.  Neurological: Neurological negative. Hematologic: Hematologic/lymphatic negative.  Psychiatric: Psychiatric negative.       Objective:  Objective  Vitals:   06/01/21 1558  BP: 115/84  Pulse: 93  Resp: 20  Temp: 98.2 F (36.8 C)  SpO2: 96%     Physical Exam HENT:     Head: Normocephalic.     Nose:     Comments: Wearing a mask Eyes:     Pupils: Pupils are equal, round, and reactive to light.  Cardiovascular:     Rate and Rhythm: Rhythm irregular.     Pulses: Normal pulses.  Abdominal:     General: Abdomen is flat.     Palpations: Abdomen is soft.  Musculoskeletal:     Cervical back: Normal range of motion.     Right lower leg: Edema present.     Left lower leg: Edema present.     Comments: Bilateral medial thighs and legs with significant varicosities.   Skin:    General: Skin is warm and dry.     Capillary Refill: Capillary refill takes less than 2 seconds.  Neurological:     General: No focal deficit present.     Mental Status: He is alert.  Psychiatric:        Mood and Affect: Mood normal.  Behavior: Behavior normal.    Data: LEFT          Reflux NoRefluxReflux TimeDiameter cmsComments                                        Yes                                                 +--------------+---------+------+-----------+------------+-----------------  ----+  CFV                     yes   >1 second                                     +--------------+---------+------+-----------+------------+-----------------  ----+  FV mid                  yes   >1 second                                     +--------------+---------+------+-----------+------------+-----------------  ----+  Popliteal     no                                                            +--------------+---------+------+-----------+------------+-----------------  ----+  GSV at [facility]              yes    >500 ms      1.15                            +--------------+---------+------+-----------+------------+-----------------  ----+  GSV prox thigh          yes    >500 ms      0.75                            +--------------+---------+------+-----------+------------+-----------------  ----+  GSV mid thigh           yes    >500 ms      1.46    small area of  chronic                                                      thrombus                +--------------+---------+------+-----------+------------+-----------------  ----+  GSV dist thigh          yes    >500 ms      1.11    small area of  chronic  thrombus                +--------------+---------+------+-----------+------------+-----------------  ----+  GSV at knee              yes    >500 ms      0.70                            +--------------+---------+------+-----------+------------+-----------------  ----+  GSV prox calf           yes    >500 ms      0.74                            +--------------+---------+------+-----------+------------+-----------------  ----+  SSV Pop Fossa no                            0.52                            +--------------+---------+------+-----------+------------+-----------------  ----+  SSV prox calf           yes    >500 ms      0.39                            +--------------+---------+------+-----------+------------+-----------------  ----+  SSV mid calf  no                            0.39                            +--------------+---------+------+-----------+------------+-----------------  ----+    Summary:  Left:  - No evidence of deep vein thrombosis seen in the left lower extremity,  from the common femoral through the popliteal veins.     - Venous reflux is noted in the left common femoral vein.  - Venous reflux is noted in the left sapheno-femoral junction.  - Venous reflux is noted in the left greater saphenous vein in the thigh.  - Venous reflux is noted in the left greater saphenous vein in the calf.  - Venous reflux is noted in the left femoral vein.  - Venous reflux is noted in the mid left short saphenous vein.      Assessment/Plan:     70 year old male with C3 venous disease with pain and discomfort in his bilateral legs but also needs left knee replacement.  I have discussed the importance of continuing his thigh-high compression stockings.  He will follow-up in 3 months for further evaluation at which time hopefully he has recovered from his knee replacement and we can discuss his options moving forward.     Waynetta Sandy MD [facility]

## 2021-06-01 NOTE — Anesthesia Preprocedure Evaluation (Addendum)
Anesthesia Evaluation  Patient identified by MRN, date of birth, ID band Patient awake    Reviewed: Allergy & Precautions, NPO status , Patient's Chart, lab work & pertinent test results  Airway Mallampati: II  TM Distance: >3 FB Neck ROM: Full    Dental no notable dental hx. (+) Teeth Intact, Dental Advisory Given,    Pulmonary sleep apnea and Continuous Positive Airway Pressure Ventilation ,    Pulmonary exam normal breath sounds clear to auscultation       Cardiovascular + CAD  Normal cardiovascular exam+ dysrhythmias Atrial Fibrillation  Rhythm:Regular Rate:Normal  EF55%   Neuro/Psych Anxiety negative neurological ROS     GI/Hepatic PUD, GERD  ,  Endo/Other    Renal/GU Lab Results      Component                Value               Date                      CREATININE               0.79                05/31/2021                BUN                      28 (H)              05/31/2021                NA                       138                 05/31/2021                K                        3.6                 05/31/2021                CL                       101                 05/31/2021                CO2                      28                  05/31/2021                Musculoskeletal  (+) Arthritis ,   Abdominal   Peds  Hematology Lab Results      Component                Value               Date                      WBC  6.8                 05/31/2021                HGB                      14.0                05/31/2021                HCT                      41.8                05/31/2021                MCV                      91.9                05/31/2021                PLT                      188                 05/31/2021              Anesthesia Other Findings All: NSAIDS   Reproductive/Obstetrics                            Anesthesia  Physical Anesthesia Plan  ASA: 2  Anesthesia Plan: General   Post-op Pain Management:  Regional for Post-op pain   Induction:   PONV Risk Score and Plan: 2 and Treatment may vary due to age or medical condition, Midazolam and Ondansetron  Airway Management Planned: Oral ETT  Additional Equipment: None  Intra-op Plan:   Post-operative Plan: Extubation in OR  Informed Consent: I have reviewed the patients History and Physical, chart, labs and discussed the procedure including the risks, benefits and alternatives for the proposed anesthesia with the patient or authorized representative who has indicated his/her understanding and acceptance.     Dental advisory given  Plan Discussed with: CRNA  Anesthesia Plan Comments: (See PAT note written 06/01/2021 by Myra Gianotti, PA-C. On Eliquis for afib, Florinef for orthostatic hypotension.   L adductor Canal  Block + Sp vs GA CHECK LAST ELIQUIS DOSE if > 72 Hours Then Spinal    )      Anesthesia Quick Evaluation

## 2021-06-02 ENCOUNTER — Encounter (HOSPITAL_COMMUNITY): Payer: Self-pay | Admitting: Orthopedic Surgery

## 2021-06-02 MED ORDER — TRANEXAMIC ACID 1000 MG/10ML IV SOLN
2000.0000 mg | INTRAVENOUS | Status: DC
  Filled 2021-06-02 (×2): qty 20

## 2021-06-02 NOTE — Telephone Encounter (Signed)
Per our conversation.

## 2021-06-03 ENCOUNTER — Ambulatory Visit (HOSPITAL_COMMUNITY): Payer: Medicare Other | Admitting: Vascular Surgery

## 2021-06-03 ENCOUNTER — Telehealth: Payer: Self-pay | Admitting: Orthopedic Surgery

## 2021-06-03 ENCOUNTER — Encounter (HOSPITAL_COMMUNITY)
Admission: AD | Disposition: A | Discharge status: Skilled Nursing Facility | Payer: Self-pay | Source: Home / Self Care | Attending: Orthopedic Surgery

## 2021-06-03 ENCOUNTER — Encounter (HOSPITAL_COMMUNITY): Payer: Self-pay | Admitting: Orthopedic Surgery

## 2021-06-03 ENCOUNTER — Ambulatory Visit (HOSPITAL_COMMUNITY): Payer: Medicare Other | Admitting: Certified Registered"

## 2021-06-03 ENCOUNTER — Inpatient Hospital Stay (HOSPITAL_COMMUNITY)
Admission: AD | Admit: 2021-06-03 | Discharge: 2021-06-10 | DRG: 470 | Disposition: A | Discharge status: Skilled Nursing Facility | Payer: Medicare Other | Attending: Orthopedic Surgery | Admitting: Orthopedic Surgery

## 2021-06-03 ENCOUNTER — Other Ambulatory Visit: Payer: Self-pay

## 2021-06-03 DIAGNOSIS — Z888 Allergy status to other drugs, medicaments and biological substances status: Secondary | ICD-10-CM | POA: Diagnosis not present

## 2021-06-03 DIAGNOSIS — G8918 Other acute postprocedural pain: Secondary | ICD-10-CM | POA: Diagnosis not present

## 2021-06-03 DIAGNOSIS — Z886 Allergy status to analgesic agent status: Secondary | ICD-10-CM

## 2021-06-03 DIAGNOSIS — I2510 Atherosclerotic heart disease of native coronary artery without angina pectoris: Secondary | ICD-10-CM | POA: Diagnosis not present

## 2021-06-03 DIAGNOSIS — Z885 Allergy status to narcotic agent status: Secondary | ICD-10-CM | POA: Diagnosis not present

## 2021-06-03 DIAGNOSIS — R031 Nonspecific low blood-pressure reading: Secondary | ICD-10-CM | POA: Diagnosis not present

## 2021-06-03 DIAGNOSIS — Z8249 Family history of ischemic heart disease and other diseases of the circulatory system: Secondary | ICD-10-CM | POA: Diagnosis not present

## 2021-06-03 DIAGNOSIS — Z96652 Presence of left artificial knee joint: Secondary | ICD-10-CM

## 2021-06-03 DIAGNOSIS — I4811 Longstanding persistent atrial fibrillation: Secondary | ICD-10-CM | POA: Diagnosis present

## 2021-06-03 DIAGNOSIS — Z8711 Personal history of peptic ulcer disease: Secondary | ICD-10-CM

## 2021-06-03 DIAGNOSIS — M1712 Unilateral primary osteoarthritis, left knee: Secondary | ICD-10-CM | POA: Diagnosis not present

## 2021-06-03 DIAGNOSIS — M25762 Osteophyte, left knee: Secondary | ICD-10-CM | POA: Diagnosis not present

## 2021-06-03 DIAGNOSIS — G4733 Obstructive sleep apnea (adult) (pediatric): Secondary | ICD-10-CM | POA: Diagnosis not present

## 2021-06-03 DIAGNOSIS — I480 Paroxysmal atrial fibrillation: Secondary | ICD-10-CM | POA: Diagnosis not present

## 2021-06-03 DIAGNOSIS — Z87442 Personal history of urinary calculi: Secondary | ICD-10-CM

## 2021-06-03 DIAGNOSIS — N201 Calculus of ureter: Secondary | ICD-10-CM | POA: Diagnosis not present

## 2021-06-03 DIAGNOSIS — D696 Thrombocytopenia, unspecified: Secondary | ICD-10-CM | POA: Diagnosis not present

## 2021-06-03 HISTORY — PX: TOTAL KNEE ARTHROPLASTY: SHX125

## 2021-06-03 SURGERY — ARTHROPLASTY, KNEE, TOTAL
Anesthesia: General | Site: Knee | Laterality: Left

## 2021-06-03 MED ORDER — TRANEXAMIC ACID 1000 MG/10ML IV SOLN
INTRAVENOUS | Status: DC | PRN
  Administered 2021-06-03: 2000 mg via TOPICAL

## 2021-06-03 MED ORDER — SUGAMMADEX SODIUM 200 MG/2ML IV SOLN
INTRAVENOUS | Status: DC | PRN
  Administered 2021-06-03: 200 mg via INTRAVENOUS

## 2021-06-03 MED ORDER — TRANEXAMIC ACID-NACL 1000-0.7 MG/100ML-% IV SOLN
1000.0000 mg | INTRAVENOUS | Status: AC
  Administered 2021-06-03: 1000 mg via INTRAVENOUS

## 2021-06-03 MED ORDER — PHENOL 1.4 % MT LIQD: 1.0000 | OROMUCOSAL | Status: DC | PRN

## 2021-06-03 MED ORDER — FENTANYL CITRATE (PF) 250 MCG/5ML IJ SOLN
INTRAMUSCULAR | Status: AC
  Filled 2021-06-03: qty 5

## 2021-06-03 MED ORDER — PROPOFOL 10 MG/ML IV BOLUS
INTRAVENOUS | Status: AC
  Filled 2021-06-03: qty 20

## 2021-06-03 MED ORDER — ORAL CARE MOUTH RINSE: 15.0000 mL | Freq: Once | OROMUCOSAL | Status: AC

## 2021-06-03 MED ORDER — SERTRALINE HCL 100 MG PO TABS
100.0000 mg | ORAL_TABLET | Freq: Every day | ORAL | Status: DC
  Administered 2021-06-03 – 2021-06-09 (×7): 100 mg via ORAL
  Filled 2021-06-03 (×7): qty 1

## 2021-06-03 MED ORDER — CEFAZOLIN SODIUM-DEXTROSE 2-4 GM/100ML-% IV SOLN
2.0000 g | Freq: Four times a day (QID) | INTRAVENOUS | Status: AC
  Administered 2021-06-03 (×2): 2 g via INTRAVENOUS
  Filled 2021-06-03 (×2): qty 100

## 2021-06-03 MED ORDER — SODIUM CHLORIDE 0.9 % IV SOLN: INTRAVENOUS | Status: DC

## 2021-06-03 MED ORDER — ONDANSETRON HCL 4 MG/2ML IJ SOLN
INTRAMUSCULAR | Status: DC | PRN
  Administered 2021-06-03: 4 mg via INTRAVENOUS

## 2021-06-03 MED ORDER — EPHEDRINE 5 MG/ML INJ
INTRAVENOUS | Status: AC
  Filled 2021-06-03: qty 5

## 2021-06-03 MED ORDER — DEXAMETHASONE SODIUM PHOSPHATE 10 MG/ML IJ SOLN
INTRAMUSCULAR | Status: DC | PRN
  Administered 2021-06-03: 4 mg via INTRAVENOUS

## 2021-06-03 MED ORDER — PANTOPRAZOLE SODIUM 40 MG PO TBEC
80.0000 mg | DELAYED_RELEASE_TABLET | Freq: Every day | ORAL | Status: DC
  Administered 2021-06-03 – 2021-06-09 (×7): 80 mg via ORAL
  Filled 2021-06-03 (×7): qty 2

## 2021-06-03 MED ORDER — LATANOPROST 0.005 % OP SOLN
1.0000 [drp] | Freq: Every day | OPHTHALMIC | Status: DC
  Administered 2021-06-04 – 2021-06-09 (×6): 1 [drp] via OPHTHALMIC
  Filled 2021-06-03 (×2): qty 2.5

## 2021-06-03 MED ORDER — TRANEXAMIC ACID-NACL 1000-0.7 MG/100ML-% IV SOLN
1000.0000 mg | Freq: Once | INTRAVENOUS | Status: AC
  Administered 2021-06-03: 1000 mg via INTRAVENOUS
  Filled 2021-06-03: qty 100

## 2021-06-03 MED ORDER — ACETAMINOPHEN 10 MG/ML IV SOLN
INTRAVENOUS | Status: DC | PRN
  Administered 2021-06-03: 1000 mg via INTRAVENOUS

## 2021-06-03 MED ORDER — LIDOCAINE 2% (20 MG/ML) 5 ML SYRINGE
INTRAMUSCULAR | Status: AC
  Filled 2021-06-03: qty 5

## 2021-06-03 MED ORDER — BISACODYL 10 MG RE SUPP: 10.0000 mg | Freq: Every day | RECTAL | Status: DC | PRN

## 2021-06-03 MED ORDER — FENTANYL CITRATE (PF) 250 MCG/5ML IJ SOLN
INTRAMUSCULAR | Status: DC | PRN
  Administered 2021-06-03: 100 ug via INTRAVENOUS
  Administered 2021-06-03 (×2): 50 ug via INTRAVENOUS

## 2021-06-03 MED ORDER — LACTATED RINGERS IV SOLN: INTRAVENOUS | Status: DC | PRN

## 2021-06-03 MED ORDER — CHLORHEXIDINE GLUCONATE 0.12 % MT SOLN
OROMUCOSAL | Status: AC
  Administered 2021-06-03: 15 mL via OROMUCOSAL
  Filled 2021-06-03: qty 15

## 2021-06-03 MED ORDER — APIXABAN 5 MG PO TABS: 5.0000 mg | ORAL_TABLET | Freq: Two times a day (BID) | ORAL | Status: DC

## 2021-06-03 MED ORDER — TRANEXAMIC ACID-NACL 1000-0.7 MG/100ML-% IV SOLN
INTRAVENOUS | Status: AC
  Filled 2021-06-03: qty 100

## 2021-06-03 MED ORDER — AMISULPRIDE (ANTIEMETIC) 5 MG/2ML IV SOLN: 10.0000 mg | Freq: Once | INTRAVENOUS | Status: DC | PRN

## 2021-06-03 MED ORDER — LACTATED RINGERS IV SOLN: INTRAVENOUS | Status: DC

## 2021-06-03 MED ORDER — SODIUM CHLORIDE 0.9 % IR SOLN
Status: DC | PRN
  Administered 2021-06-03: 3000 mL

## 2021-06-03 MED ORDER — LORAZEPAM 0.5 MG PO TABS
0.5000 mg | ORAL_TABLET | ORAL | Status: DC
  Administered 2021-06-03 – 2021-06-10 (×43): 0.5 mg via ORAL
  Filled 2021-06-03 (×43): qty 1

## 2021-06-03 MED ORDER — CHLORHEXIDINE GLUCONATE 0.12 % MT SOLN: 15.0000 mL | Freq: Once | OROMUCOSAL | Status: AC

## 2021-06-03 MED ORDER — PANTOPRAZOLE SODIUM 40 MG PO TBEC: 40.0000 mg | DELAYED_RELEASE_TABLET | Freq: Every day | ORAL | Status: DC

## 2021-06-03 MED ORDER — HYDROMORPHONE HCL 1 MG/ML IJ SOLN
0.2500 mg | INTRAMUSCULAR | Status: DC | PRN
  Administered 2021-06-03 (×4): 0.5 mg via INTRAVENOUS

## 2021-06-03 MED ORDER — ROCURONIUM BROMIDE 10 MG/ML (PF) SYRINGE
PREFILLED_SYRINGE | INTRAVENOUS | Status: AC
  Filled 2021-06-03: qty 10

## 2021-06-03 MED ORDER — KETOROLAC TROMETHAMINE 15 MG/ML IJ SOLN
15.0000 mg | Freq: Four times a day (QID) | INTRAMUSCULAR | Status: AC | PRN
  Administered 2021-06-03: 15 mg via INTRAVENOUS

## 2021-06-03 MED ORDER — ONDANSETRON HCL 4 MG/2ML IJ SOLN: 4.0000 mg | Freq: Four times a day (QID) | INTRAMUSCULAR | Status: DC | PRN

## 2021-06-03 MED ORDER — ACETAMINOPHEN 10 MG/ML IV SOLN
INTRAVENOUS | Status: AC
  Filled 2021-06-03: qty 100

## 2021-06-03 MED ORDER — MENTHOL 3 MG MT LOZG: 1.0000 | LOZENGE | OROMUCOSAL | Status: DC | PRN

## 2021-06-03 MED ORDER — ONDANSETRON HCL 4 MG PO TABS: 4.0000 mg | ORAL_TABLET | Freq: Four times a day (QID) | ORAL | Status: DC | PRN

## 2021-06-03 MED ORDER — MIDAZOLAM HCL 2 MG/2ML IJ SOLN
INTRAMUSCULAR | Status: DC | PRN
  Administered 2021-06-03: 2 mg via INTRAVENOUS

## 2021-06-03 MED ORDER — PROPOFOL 10 MG/ML IV BOLUS
INTRAVENOUS | Status: DC | PRN
  Administered 2021-06-03: 150 mg via INTRAVENOUS

## 2021-06-03 MED ORDER — POLYETHYLENE GLYCOL 3350 17 G PO PACK: 17.0000 g | PACK | Freq: Every day | ORAL | Status: DC | PRN

## 2021-06-03 MED ORDER — DOCUSATE SODIUM 100 MG PO CAPS
100.0000 mg | ORAL_CAPSULE | Freq: Two times a day (BID) | ORAL | Status: DC
  Administered 2021-06-03 – 2021-06-10 (×14): 100 mg via ORAL
  Filled 2021-06-03 (×14): qty 1

## 2021-06-03 MED ORDER — METHOCARBAMOL 500 MG PO TABS
500.0000 mg | ORAL_TABLET | Freq: Four times a day (QID) | ORAL | Status: DC | PRN
  Administered 2021-06-03 – 2021-06-09 (×10): 500 mg via ORAL
  Filled 2021-06-03 (×9): qty 1

## 2021-06-03 MED ORDER — ROCURONIUM BROMIDE 10 MG/ML (PF) SYRINGE
PREFILLED_SYRINGE | INTRAVENOUS | Status: DC | PRN
  Administered 2021-06-03: 80 mg via INTRAVENOUS

## 2021-06-03 MED ORDER — ATORVASTATIN CALCIUM 10 MG PO TABS
20.0000 mg | ORAL_TABLET | Freq: Every day | ORAL | Status: DC
  Administered 2021-06-03 – 2021-06-09 (×7): 20 mg via ORAL
  Filled 2021-06-03 (×7): qty 2

## 2021-06-03 MED ORDER — HYDROCODONE-ACETAMINOPHEN 5-325 MG PO TABS
ORAL_TABLET | ORAL | Status: AC
  Filled 2021-06-03: qty 2

## 2021-06-03 MED ORDER — ALUM & MAG HYDROXIDE-SIMETH 200-200-20 MG/5ML PO SUSP: 30.0000 mL | ORAL | Status: DC | PRN

## 2021-06-03 MED ORDER — METHOCARBAMOL 1000 MG/10ML IJ SOLN
500.0000 mg | Freq: Four times a day (QID) | INTRAVENOUS | Status: DC | PRN
  Filled 2021-06-03: qty 5

## 2021-06-03 MED ORDER — OXYCODONE HCL 5 MG/5ML PO SOLN: 5.0000 mg | Freq: Once | ORAL | Status: AC | PRN

## 2021-06-03 MED ORDER — METOCLOPRAMIDE HCL 5 MG PO TABS: 5.0000 mg | ORAL_TABLET | Freq: Three times a day (TID) | ORAL | Status: DC | PRN

## 2021-06-03 MED ORDER — ROPIVACAINE HCL 7.5 MG/ML IJ SOLN
INTRAMUSCULAR | Status: DC | PRN
  Administered 2021-06-03: 20 mL via PERINEURAL

## 2021-06-03 MED ORDER — LIDOCAINE 2% (20 MG/ML) 5 ML SYRINGE
INTRAMUSCULAR | Status: DC | PRN
  Administered 2021-06-03: 100 mg via INTRAVENOUS

## 2021-06-03 MED ORDER — METHOCARBAMOL 500 MG PO TABS
ORAL_TABLET | ORAL | Status: AC
  Filled 2021-06-03: qty 1

## 2021-06-03 MED ORDER — MORPHINE SULFATE (PF) 2 MG/ML IV SOLN
0.5000 mg | INTRAVENOUS | Status: DC | PRN
  Administered 2021-06-03: 1 mg via INTRAVENOUS
  Administered 2021-06-04: 0.5 mg via INTRAVENOUS
  Administered 2021-06-07: 1 mg via INTRAVENOUS
  Filled 2021-06-03 (×3): qty 1

## 2021-06-03 MED ORDER — KETOROLAC TROMETHAMINE 15 MG/ML IJ SOLN
INTRAMUSCULAR | Status: AC
  Filled 2021-06-03: qty 1

## 2021-06-03 MED ORDER — 0.9 % SODIUM CHLORIDE (POUR BTL) OPTIME
TOPICAL | Status: DC | PRN
  Administered 2021-06-03: 1000 mL

## 2021-06-03 MED ORDER — ONDANSETRON HCL 4 MG/2ML IJ SOLN
INTRAMUSCULAR | Status: AC
  Filled 2021-06-03: qty 2

## 2021-06-03 MED ORDER — HYDROMORPHONE HCL 1 MG/ML IJ SOLN
INTRAMUSCULAR | Status: AC
  Filled 2021-06-03: qty 1

## 2021-06-03 MED ORDER — METOCLOPRAMIDE HCL 5 MG/ML IJ SOLN: 5.0000 mg | Freq: Three times a day (TID) | INTRAMUSCULAR | Status: DC | PRN

## 2021-06-03 MED ORDER — POVIDONE-IODINE 10 % EX SWAB
2.0000 "application " | Freq: Once | CUTANEOUS | Status: AC
  Administered 2021-06-03: 2 via TOPICAL

## 2021-06-03 MED ORDER — HYDROCODONE-ACETAMINOPHEN 7.5-325 MG PO TABS
1.0000 | ORAL_TABLET | ORAL | Status: DC | PRN
  Administered 2021-06-03 – 2021-06-04 (×3): 2 via ORAL
  Administered 2021-06-04: 1 via ORAL
  Administered 2021-06-04 – 2021-06-10 (×27): 2 via ORAL
  Filled 2021-06-03 (×27): qty 2
  Filled 2021-06-03: qty 1
  Filled 2021-06-03 (×3): qty 2

## 2021-06-03 MED ORDER — APIXABAN 5 MG PO TABS
5.0000 mg | ORAL_TABLET | Freq: Two times a day (BID) | ORAL | Status: DC
  Administered 2021-06-04 – 2021-06-10 (×13): 5 mg via ORAL
  Filled 2021-06-03 (×13): qty 1

## 2021-06-03 MED ORDER — ONDANSETRON HCL 4 MG/2ML IJ SOLN: 4.0000 mg | Freq: Once | INTRAMUSCULAR | Status: DC | PRN

## 2021-06-03 MED ORDER — CEFAZOLIN SODIUM-DEXTROSE 2-4 GM/100ML-% IV SOLN
2.0000 g | INTRAVENOUS | Status: AC
  Administered 2021-06-03: 2 g via INTRAVENOUS

## 2021-06-03 MED ORDER — DEXMEDETOMIDINE (PRECEDEX) IN NS 20 MCG/5ML (4 MCG/ML) IV SYRINGE
PREFILLED_SYRINGE | INTRAVENOUS | Status: DC | PRN
  Administered 2021-06-03: 8 ug via INTRAVENOUS
  Administered 2021-06-03: 4 ug via INTRAVENOUS

## 2021-06-03 MED ORDER — FLUDROCORTISONE ACETATE 0.1 MG PO TABS
0.1000 mg | ORAL_TABLET | Freq: Every day | ORAL | Status: DC
  Administered 2021-06-04 – 2021-06-10 (×7): 0.1 mg via ORAL
  Filled 2021-06-03 (×8): qty 1

## 2021-06-03 MED ORDER — OXYCODONE HCL 5 MG PO TABS
5.0000 mg | ORAL_TABLET | Freq: Once | ORAL | Status: AC | PRN
  Administered 2021-06-03: 5 mg via ORAL

## 2021-06-03 MED ORDER — CEFAZOLIN SODIUM-DEXTROSE 2-4 GM/100ML-% IV SOLN
INTRAVENOUS | Status: AC
  Filled 2021-06-03: qty 100

## 2021-06-03 MED ORDER — ACETAMINOPHEN 325 MG PO TABS: 325.0000 mg | ORAL_TABLET | Freq: Four times a day (QID) | ORAL | Status: DC | PRN

## 2021-06-03 MED ORDER — MIDAZOLAM HCL 2 MG/2ML IJ SOLN
INTRAMUSCULAR | Status: AC
  Filled 2021-06-03: qty 2

## 2021-06-03 MED ORDER — GABAPENTIN 100 MG PO CAPS
200.0000 mg | ORAL_CAPSULE | Freq: Every day | ORAL | Status: DC
  Administered 2021-06-03 – 2021-06-09 (×7): 200 mg via ORAL
  Filled 2021-06-03 (×7): qty 2

## 2021-06-03 MED ORDER — OXYCODONE HCL 5 MG PO TABS
ORAL_TABLET | ORAL | Status: AC
  Filled 2021-06-03: qty 2

## 2021-06-03 MED ORDER — POLYETHYL GLYCOL-PROPYL GLYCOL 0.4-0.3 % OP GEL: Freq: Every day | OPHTHALMIC | Status: DC | PRN

## 2021-06-03 MED ORDER — DIPHENHYDRAMINE HCL 12.5 MG/5ML PO ELIX
12.5000 mg | ORAL_SOLUTION | ORAL | Status: DC | PRN
  Administered 2021-06-04: 25 mg via ORAL
  Filled 2021-06-03: qty 10

## 2021-06-03 MED ORDER — ACETAMINOPHEN 10 MG/ML IV SOLN: 1000.0000 mg | Freq: Once | INTRAVENOUS | Status: DC | PRN

## 2021-06-03 MED ORDER — DEXMEDETOMIDINE (PRECEDEX) IN NS 20 MCG/5ML (4 MCG/ML) IV SYRINGE
PREFILLED_SYRINGE | INTRAVENOUS | Status: AC
  Filled 2021-06-03: qty 5

## 2021-06-03 MED ORDER — HYDROCODONE-ACETAMINOPHEN 5-325 MG PO TABS
1.0000 | ORAL_TABLET | ORAL | Status: DC | PRN
  Administered 2021-06-03: 2 via ORAL
  Administered 2021-06-04 – 2021-06-08 (×2): 1 via ORAL
  Administered 2021-06-08 (×3): 2 via ORAL
  Administered 2021-06-08: 1 via ORAL
  Filled 2021-06-03: qty 1
  Filled 2021-06-03 (×2): qty 2
  Filled 2021-06-03: qty 1
  Filled 2021-06-03 (×2): qty 2

## 2021-06-03 MED ORDER — ZOLPIDEM TARTRATE 5 MG PO TABS
5.0000 mg | ORAL_TABLET | Freq: Every evening | ORAL | Status: DC | PRN
  Administered 2021-06-04 – 2021-06-09 (×6): 5 mg via ORAL
  Filled 2021-06-03 (×6): qty 1

## 2021-06-03 SURGICAL SUPPLY — 57 items
BAG COUNTER SPONGE SURGICOUNT (BAG) ×2 IMPLANT
BLADE SAGITTAL 25.0X1.19X90 (BLADE) ×2 IMPLANT
BLADE SAW SGTL 13X75X1.27 (BLADE) ×2 IMPLANT
BLADE SURG 21 STRL SS (BLADE) ×4 IMPLANT
BNDG COHESIVE 6X5 TAN NS LF (GAUZE/BANDAGES/DRESSINGS) ×2 IMPLANT
BNDG COHESIVE 6X5 TAN STRL LF (GAUZE/BANDAGES/DRESSINGS) ×4 IMPLANT
BNDG GAUZE ELAST 4 BULKY (GAUZE/BANDAGES/DRESSINGS) ×2 IMPLANT
BOWL SMART MIX CTS (DISPOSABLE) ×2 IMPLANT
COMP FEM CR PS 12 LT (Joint) ×2 IMPLANT
COMP PATELLAR 10X35 METAL (Joint) ×2 IMPLANT
COMP TIB TM PS H LT (Joint) ×2 IMPLANT
COMPONENT FEM CR PS 12 LT (Joint) ×1 IMPLANT
COMPONENT PATELLAR 10X35 METAL (Joint) ×1 IMPLANT
COMPONET TIB TM PS H LT (Joint) ×1 IMPLANT
COOLER ICEMAN CLASSIC (MISCELLANEOUS) ×2 IMPLANT
COVER SURGICAL LIGHT HANDLE (MISCELLANEOUS) ×2 IMPLANT
CUFF TOURN SGL QUICK 34 (TOURNIQUET CUFF) ×2
CUFF TOURN SGL QUICK 42 (TOURNIQUET CUFF) IMPLANT
CUFF TRNQT CYL 34X4.125X (TOURNIQUET CUFF) ×1 IMPLANT
DRAPE EXTREMITY T 121X128X90 (DISPOSABLE) ×2 IMPLANT
DRAPE HALF SHEET 40X57 (DRAPES) ×4 IMPLANT
DRAPE U-SHAPE 47X51 STRL (DRAPES) ×2 IMPLANT
DRSG ADAPTIC 3X8 NADH LF (GAUZE/BANDAGES/DRESSINGS) ×2 IMPLANT
DRSG EMULSION OIL 3X3 NADH (GAUZE/BANDAGES/DRESSINGS) ×2 IMPLANT
DRSG PAD ABDOMINAL 8X10 ST (GAUZE/BANDAGES/DRESSINGS) ×2 IMPLANT
DURAPREP 26ML APPLICATOR (WOUND CARE) ×2 IMPLANT
ELECT REM PT RETURN 9FT ADLT (ELECTROSURGICAL) ×2
ELECTRODE REM PT RTRN 9FT ADLT (ELECTROSURGICAL) ×1 IMPLANT
FACESHIELD WRAPAROUND (MASK) ×2 IMPLANT
GAUZE SPONGE 4X4 12PLY STRL (GAUZE/BANDAGES/DRESSINGS) ×2 IMPLANT
GAUZE SPONGE 4X4 12PLY STRL LF (GAUZE/BANDAGES/DRESSINGS) ×2 IMPLANT
GLOVE SURG ORTHO LTX SZ9 (GLOVE) ×2 IMPLANT
GLOVE SURG UNDER POLY LF SZ9 (GLOVE) ×2 IMPLANT
GOWN STRL REUS W/ TWL XL LVL3 (GOWN DISPOSABLE) ×2 IMPLANT
GOWN STRL REUS W/TWL XL LVL3 (GOWN DISPOSABLE) ×4
HANDPIECE INTERPULSE COAX TIP (DISPOSABLE) ×2
HDLS TROCR DRIL PIN KNEE 75 (PIN) ×2
KIT BASIN OR (CUSTOM PROCEDURE TRAY) ×2 IMPLANT
KIT TURNOVER KIT B (KITS) ×2 IMPLANT
LINER TIB ASF PS GH/12 LT (Liner) ×2 IMPLANT
MANIFOLD NEPTUNE II (INSTRUMENTS) ×2 IMPLANT
NS IRRIG 1000ML POUR BTL (IV SOLUTION) ×2 IMPLANT
PACK TOTAL JOINT (CUSTOM PROCEDURE TRAY) ×2 IMPLANT
PAD ARMBOARD 7.5X6 YLW CONV (MISCELLANEOUS) ×2 IMPLANT
PAD COLD SHLDR WRAP-ON (PAD) ×2 IMPLANT
PIN DRILL HDLS TROCAR 75 4PK (PIN) ×1 IMPLANT
SCREW FEMALE HEX FIX 25X2.5 (ORTHOPEDIC DISPOSABLE SUPPLIES) ×2 IMPLANT
SET HNDPC FAN SPRY TIP SCT (DISPOSABLE) ×1 IMPLANT
STAPLER VISISTAT 35W (STAPLE) ×2 IMPLANT
SUCTION FRAZIER HANDLE 10FR (MISCELLANEOUS)
SUCTION TUBE FRAZIER 10FR DISP (MISCELLANEOUS) IMPLANT
SUT VIC AB 0 CT1 27 (SUTURE) ×2
SUT VIC AB 0 CT1 27XBRD ANBCTR (SUTURE) ×1 IMPLANT
SUT VIC AB 1 CTX 36 (SUTURE)
SUT VIC AB 1 CTX36XBRD ANBCTR (SUTURE) IMPLANT
TOWEL GREEN STERILE (TOWEL DISPOSABLE) ×2 IMPLANT
TOWEL GREEN STERILE FF (TOWEL DISPOSABLE) ×2 IMPLANT

## 2021-06-03 NOTE — Discharge Instructions (Addendum)
INSTRUCTIONS AFTER JOINT REPLACEMENT   Remove items at home which could result in a fall. This includes throw rugs or furniture in walking pathways ICE to the affected joint every three hours while awake for 30 minutes at a time, for at least the first 3-5 days, and then as needed for pain and swelling.  Continue to use ice for pain and swelling. You may notice swelling that will progress down to the foot and ankle.  This is normal after surgery.  Elevate your leg when you are not up walking on it.   Continue to use the breathing machine you got in the hospital (incentive spirometer) which will help keep your temperature down.  It is common for your temperature to cycle up and down following surgery, especially at night when you are not up moving around and exerting yourself.  The breathing machine keeps your lungs expanded and your temperature down.   DIET:  As you were doing prior to hospitalization, we recommend a well-balanced diet.  DRESSING / WOUND CARE / SHOWERING  You may change your dressing 3-5 days after surgery.  Then change the dressing every day with sterile gauze.  Please use good hand washing techniques before changing the dressing.  Do not use any lotions or creams on the incision until instructed by your surgeon.  ACTIVITY  Increase activity slowly as tolerated, but follow the weight bearing instructions below.   No driving for 6 weeks or until further direction given by your physician.  You cannot drive while taking narcotics.  No lifting or carrying greater than 10 lbs. until further directed by your surgeon. Avoid periods of inactivity such as sitting longer than an hour when not asleep. This helps prevent blood clots.  You may return to work once you are authorized by your doctor.     WEIGHT BEARING   Weight bearing as tolerated with assist device (walker, cane, etc) as directed, use it as long as suggested by your surgeon or therapist, typically at least 4-6  weeks.   EXERCISES  Results after joint replacement surgery are often greatly improved when you follow the exercise, range of motion and muscle strengthening exercises prescribed by your doctor. Safety measures are also important to protect the joint from further injury. Any time any of these exercises cause you to have increased pain or swelling, decrease what you are doing until you are comfortable again and then slowly increase them. If you have problems or questions, call your caregiver or physical therapist for advice.   Rehabilitation is important following a joint replacement. After just a few days of immobilization, the muscles of the leg can become weakened and shrink (atrophy).  These exercises are designed to build up the tone and strength of the thigh and leg muscles and to improve motion. Often times heat used for twenty to thirty minutes before working out will loosen up your tissues and help with improving the range of motion but do not use heat for the first two weeks following surgery (sometimes heat can increase post-operative swelling).   These exercises can be done on a training (exercise) mat, on the floor, on a table or on a bed. Use whatever works the best and is most comfortable for you.    Use music or television while you are exercising so that the exercises are a pleasant break in your day. This will make your life better with the exercises acting as a break in your routine that you can look forward   to.   Perform all exercises about fifteen times, three times per day or as directed.  You should exercise both the operative leg and the other leg as well.  Exercises include:   Quad Sets - Tighten up the muscle on the front of the thigh (Quad) and hold for 5-10 seconds.   Straight Leg Raises - With your knee straight (if you were given a brace, keep it on), lift the leg to 60 degrees, hold for 3 seconds, and slowly lower the leg.  Perform this exercise against resistance later as  your leg gets stronger.  Leg Slides: Lying on your back, slowly slide your foot toward your buttocks, bending your knee up off the floor (only go as far as is comfortable). Then slowly slide your foot back down until your leg is flat on the floor again.  Angel Wings: Lying on your back spread your legs to the side as far apart as you can without causing discomfort.  Hamstring Strength:  Lying on your back, push your heel against the floor with your leg straight by tightening up the muscles of your buttocks.  Repeat, but this time bend your knee to a comfortable angle, and push your heel against the floor.  You may put a pillow under the heel to make it more comfortable if necessary.   A rehabilitation program following joint replacement surgery can speed recovery and prevent re-injury in the future due to weakened muscles. Contact your doctor or a physical therapist for more information on knee rehabilitation.    CONSTIPATION  Constipation is defined medically as fewer than three stools per week and severe constipation as less than one stool per week.  Even if you have a regular bowel pattern at home, your normal regimen is likely to be disrupted due to multiple reasons following surgery.  Combination of anesthesia, postoperative narcotics, change in appetite and fluid intake all can affect your bowels.   YOU MUST use at least one of the following options; they are listed in order of increasing strength to get the job done.  They are all available over the counter, and you may need to use some, POSSIBLY even all of these options:    Drink plenty of fluids (prune juice may be helpful) and high fiber foods Colace 100 mg by mouth twice a day  Senokot for constipation as directed and as needed Dulcolax (bisacodyl), take with full glass of water  Miralax (polyethylene glycol) once or twice a day as needed.  If you have tried all these things and are unable to have a bowel movement in the first 3-4 days  after surgery call either your surgeon or your primary doctor.    If you experience loose stools or diarrhea, hold the medications until you stool forms back up.  If your symptoms do not get better within 1 week or if they get worse, check with your doctor.  If you experience "the worst abdominal pain ever" or develop nausea or vomiting, please contact the office immediately for further recommendations for treatment.   ITCHING:  If you experience itching with your medications, try taking only a single pain pill, or even half a pain pill at a time.  You can also use Benadryl over the counter for itching or also to help with sleep.   TED HOSE STOCKINGS:  Use stockings on both legs until for at least 2 weeks or as directed by physician office. They may be removed at night for sleeping.  MEDICATIONS:  See your medication summary on the "After Visit Summary" that nursing will review with you.  You may have some home medications which will be placed on hold until you complete the course of blood thinner medication.  It is important for you to complete the blood thinner medication as prescribed.  PRECAUTIONS:  If you experience chest pain or shortness of breath - call 911 immediately for transfer to the hospital emergency department.   If you develop a fever greater that 101 F, purulent drainage from wound, increased redness or drainage from wound, foul odor from the wound/dressing, or calf pain - CONTACT YOUR SURGEON.                                                   FOLLOW-UP APPOINTMENTS:  If you do not already have a post-op appointment, please call the office for an appointment to be seen by your surgeon.  Guidelines for how soon to be seen are listed in your "After Visit Summary", but are typically between 1-4 weeks after surgery.  OTHER INSTRUCTIONS:   Knee Replacement:  Do not place pillow under knee, focus on keeping the knee straight while resting. CPM instructions: 0-90 degrees, 2 hours in the  morning, 2 hours in the afternoon, and 2 hours in the evening. Place foam block, curve side up under heel at all times except when in CPM or when walking.  DO NOT modify, tear, cut, or change the foam block in any way.  POST-OPERATIVE OPIOID TAPER INSTRUCTIONS: It is important to wean off of your opioid medication as soon as possible. If you do not need pain medication after your surgery it is ok to stop day one. Opioids include: Codeine, Hydrocodone(Norco, Vicodin), Oxycodone(Percocet, oxycontin) and hydromorphone amongst others.  Long term and even short term use of opiods can cause: Increased pain response Dependence Constipation Depression Respiratory depression And more.  Withdrawal symptoms can include Flu like symptoms Nausea, vomiting And more Techniques to manage these symptoms Hydrate well Eat regular healthy meals Stay active Use relaxation techniques(deep breathing, meditating, yoga) Do Not substitute Alcohol to help with tapering If you have been on opioids for less than two weeks and do not have pain than it is ok to stop all together.  Plan to wean off of opioids This plan should start within one week post op of your joint replacement. Maintain the same interval or time between taking each dose and first decrease the dose.  Cut the total daily intake of opioids by one tablet each day Next start to increase the time between doses. The last dose that should be eliminated is the evening dose.   MAKE SURE YOU:  Understand these instructions.  Get help right away if you are not doing well or get worse.    Thank you for letting us be a part of your medical care team.  It is a privilege we respect greatly.  We hope these instructions will help you stay on track for a fast and full recovery!    ==================================================================================================  Information on my medicine - ELIQUIS (apixaban)  Why was Eliquis  prescribed for you? Eliquis was prescribed for you to reduce the risk of a blood clot forming that can cause a stroke if you have a medical condition called atrial fibrillation (a type of irregular heartbeat).  What  do You need to know about Eliquis ? Take your Eliquis TWICE DAILY - one tablet in the morning and one tablet in the evening with or without food. If you have difficulty swallowing the tablet whole please discuss with your pharmacist how to take the medication safely.  Take Eliquis exactly as prescribed by your doctor and DO NOT stop taking Eliquis without talking to the doctor who prescribed the medication.  Stopping may increase your risk of developing a stroke.  Refill your prescription before you run out.  After discharge, you should have regular check-up appointments with your healthcare provider that is prescribing your Eliquis.  In the future your dose may need to be changed if your kidney function or weight changes by a significant amount or as you get older.  What do you do if you miss a dose? If you miss a dose, take it as soon as you remember on the same day and resume taking twice daily.  Do not take more than one dose of ELIQUIS at the same time to make up a missed dose.  Important Safety Information A possible side effect of Eliquis is bleeding. You should call your healthcare provider right away if you experience any of the following: Bleeding from an injury or your nose that does not stop. Unusual colored urine (red or dark brown) or unusual colored stools (red or black). Unusual bruising for unknown reasons. A serious fall or if you hit your head (even if there is no bleeding).  Some medicines may interact with Eliquis and might increase your risk of bleeding or clotting while on Eliquis. To help avoid this, consult your healthcare provider or pharmacist prior to using any new prescription or non-prescription medications, including herbals, vitamins,  non-steroidal anti-inflammatory drugs (NSAIDs) and supplements.  This website has more information on Eliquis (apixaban): http://www.eliquis.com/eliquis/home

## 2021-06-03 NOTE — Telephone Encounter (Signed)
Pt called requesting a call back. Pt states he spoke to [facility] and they are asking for a referral to be sent to their facility. Pt is asking for a call back at [phone].

## 2021-06-03 NOTE — Progress Notes (Addendum)
  Pt called because his BP was low.  He has not yet had florinef.  He has just been back to room for an hour.  He will resume eliquis tomorrow.  Reviewed this information with him.  He had no further questions.   I did ask RN to give florinef now.   Cecilie Kicks, FNP-C At [facility]   [phone] 06/03/2021.

## 2021-06-03 NOTE — Anesthesia Postprocedure Evaluation (Signed)
Anesthesia Post Note  Patient: Francisco Padilla  Procedure(s) Performed: LEFT TOTAL KNEE ARTHROPLASTY (Left: Knee)     Patient location during evaluation: PACU Anesthesia Type: General Level of consciousness: awake and alert Pain management: pain level controlled Vital Signs Assessment: post-procedure vital signs reviewed and stable Respiratory status: spontaneous breathing, nonlabored ventilation, respiratory function stable and patient connected to nasal cannula oxygen Cardiovascular status: blood pressure returned to baseline and stable Postop Assessment: no apparent nausea or vomiting Anesthetic complications: no   No notable events documented.  Last Vitals:  Vitals:   06/03/21 1330 06/03/21 1336  BP:  101/72  Pulse: 96 85  Resp: 15 10  Temp:    SpO2: 99% 97%    Last Pain:  Vitals:   06/03/21 1206  TempSrc:   PainSc: Anna

## 2021-06-03 NOTE — Op Note (Signed)
DATE OF SURGERY:  06/03/2021  TIME: 9:08 AM  PATIENT NAME:  Francisco Padilla    AGE: 70 y.o.    PRE-OPERATIVE DIAGNOSIS:  Osteoarthritis Left Knee  POST-OPERATIVE DIAGNOSIS:  Osteoarthritis Left Knee  PROCEDURE:  Procedure(s): LEFT TOTAL KNEE ARTHROPLASTY  SURGEON: Meridee Score    OPERATIVE IMPLANTS: Zimmer Persona  Femur size 12, Tibia size H  2- Peg fixed bearing, Patella size 35 1-peg oval button, with a 12 mm polyethylene insert medial congruent.  @ENCIMAGES @     PREOPERATIVE INDICATIONS:   Francisco Padilla is a 70 y.o. year old male with end stage degenerative arthritis of the knee who failed conservative treatment and elected for Total Knee Arthroplasty.   The risks, benefits, and alternatives were discussed at length including but not limited to the risks of infection, bleeding, nerve injury, stiffness, blood clots, the need for revision surgery, cardiopulmonary complications, among others, and they were willing to proceed.  OPERATIVE DESCRIPTION:  The patient was brought to the operative room and placed in a supine position.  Anesthesia was administered.  IV antibiotics were given.  IV TXA was initiated.  The lower extremity was prepped and draped in the usual sterile fashion.  Charlie Pitter was used to cover all exposed skin. Time out was performed.    Anterior quadriceps tendon splitting approach was performed.  The patella was everted and osteophytes were removed.  The anterior horn of the medial and lateral meniscus was removed.   The distal femur was opened with the drill and the intramedullary distal femoral cutting jig was utilized, set at 5 degrees valgus resecting 9 mm off the distal femur.  Care was taken to protect the collateral ligaments.  Then the extramedullary tibial cutting jig was utilized set for 3 degree posterior slope.  Care was taken during the cut to protect the medial and collateral ligaments.  The proximal tibia was removed along with the posterior horns  of the menisci.  The PCL was sacrificed.    The extensor gap was measured and was approximately 12 mm.    The distal femoral sizing jig was applied, taking care to avoid notching.  Then the 4-in-1 cutting jig was applied and the anterior and posterior femur was cut, along with the chamfer cuts.  All posterior osteophytes were removed.  The flexion gap was then measured and was symmetric with the extension gap.  The distal femoral preparation using the appropriate jig to prepare the box.  The patella was then measured, and cut with the saw.    The proximal tibia sized and prepared accordingly with the reamer and the punch, and then all components were trialed with the poly insert.  The knee was found to have stable balance and full motion.  The knee was irrigated with normal saline, topical 2 g TXA was used to soak the wound.  The above named components were then press fit into place.  The final polyethylene component was placed.  The knee was then taken through a range of motion and the patella tracked well and the knee irrigated copiously and the parapatellar and subcutaneous tissue closed with vicryl, and skin closed with staples..  A sterile dressing was applied and patient  was taken to the PACU in stable  condition.  There were no complications.  Total tourniquet time was 22 minutes.

## 2021-06-03 NOTE — Anesthesia Procedure Notes (Signed)
Procedure Name: Intubation Date/Time: 06/03/2021 7:42 AM Performed by: Annamary Carolin, CRNA Pre-anesthesia Checklist: Patient identified, Emergency Drugs available, Suction available and Patient being monitored Patient Re-evaluated:Patient Re-evaluated prior to induction Oxygen Delivery Method: Circle System Utilized Preoxygenation: Pre-oxygenation with 100% oxygen Induction Type: IV induction Ventilation: Mask ventilation without difficulty Laryngoscope Size: Mac and 4 Grade View: Grade I Tube type: Oral Number of attempts: 1 Airway Equipment and Method: Stylet Placement Confirmation: ETT inserted through vocal cords under direct vision, positive ETCO2 and breath sounds checked- equal and bilateral Secured at: 24 cm Tube secured with: Tape Dental Injury: Teeth and Oropharynx as per pre-operative assessment  Comments: With ease

## 2021-06-03 NOTE — Anesthesia Procedure Notes (Signed)
Anesthesia Regional Block: Adductor canal block   Pre-Anesthetic Checklist: , timeout performed,  Correct Patient, Correct Site, Correct Laterality,  Correct Procedure, Correct Position, site marked,  Risks and benefits discussed,  Surgical consent,  Pre-op evaluation,  At surgeon's request and post-op pain management  Laterality: Lower and Left  Prep: chloraprep       Needles:  Injection technique: Single-shot  Needle Type: Echogenic Needle     Needle Length: 9cm  Needle Gauge: 22     Additional Needles:   Procedures:,,,, ultrasound used (permanent image in chart),,    Narrative:  Start time: 06/03/2021 7:20 AM End time: 06/03/2021 7:25 AM Injection made incrementally with aspirations every 5 mL.  Performed by: Personally  Anesthesiologist: Barnet Glasgow, MD  Additional Notes: Block assessed prior to surgery. Pt tolerated procedure well.

## 2021-06-03 NOTE — Transfer of Care (Signed)
Immediate Anesthesia Transfer of Care Note  Patient: Francisco Padilla  Procedure(s) Performed: LEFT TOTAL KNEE ARTHROPLASTY (Left: Knee)  Patient Location: PACU  Anesthesia Type:General and Regional  Level of Consciousness: awake, alert , oriented and patient cooperative  Airway & Oxygen Therapy: Patient Spontanous Breathing and Patient connected to face mask oxygen  Post-op Assessment: Report given to RN, Post -op Vital signs reviewed and stable and Patient moving all extremities X 4  Post vital signs: Reviewed and stable  Last Vitals:  Vitals Value Taken Time  BP 96/71 06/03/21 0906  Temp    Pulse 91 06/03/21 0909  Resp 17 06/03/21 0909  SpO2 97 % 06/03/21 0909  Vitals shown include unvalidated device data.  Last Pain:  Vitals:   06/03/21 0627  TempSrc:   PainSc: 3       Patients Stated Pain Goal: 1 (90/30/14 9969)  Complications: No notable events documented.

## 2021-06-03 NOTE — Evaluation (Signed)
Physical Therapy Evaluation Patient Details Name: Francisco Padilla MRN: [id number] DOB: [birth year] Today's Date: 06/03/2021  History of Present Illness  70 y.o. male admitted on 06/03/21 for L TKA, WBAT post op.  Pt with significant PMH ofADD, anxiety, A fib, A flutter, DJD, orthostatic hypotension, pre diabetes, gastric bypass, R ulnar nerve transposition.  Clinical Impression  Pt was able to walk a short distance across the room to the recliner chair with min to min guard assist and RW.  He did get a bit lightheaded once seated with a touch of nausea BP 91/58, so I reclined him more and encouraged ankle pumps.  Knee exercise handout given and started to review.   PT to follow acutely for deficits listed below.        Recommendations for follow up therapy are one component of a multi-disciplinary discharge planning process, led by the attending physician.  Recommendations may be updated based on patient status, additional functional criteria and insurance authorization.  Follow Up Recommendations Follow physician's recommendations for discharge plan and follow up therapies    Assistance Recommended at Discharge Set up Supervision/Assistance  Functional Status Assessment Patient has had a recent decline in their functional status and demonstrates the ability to make significant improvements in function in a reasonable and predictable amount of time.  Equipment Recommendations  Rolling walker (2 wheels)    Recommendations for Other Services       Precautions / Restrictions Precautions Precautions: Knee Precaution Booklet Issued: Yes (comment) Precaution Comments: knee exercise handout given Restrictions Weight Bearing Restrictions: No LLE Weight Bearing: Weight bearing as tolerated      Mobility  Bed Mobility Overal bed mobility: Modified Independent                  Transfers Overall transfer level: Needs assistance Equipment used: Rolling walker (2 wheels) Transfers:  Sit to/from Stand;Bed to chair/wheelchair/BSC Sit to Stand: Min guard   Step pivot transfers: Min assist       General transfer comment: Min assist cues for WBAT, heavy reliance on hands and weight shift to the right to support painful L leg    Ambulation/Gait Ambulation/Gait assistance: Min guard Gait Distance (Feet): 8 Feet Assistive device: Rolling walker (2 wheels) Gait Pattern/deviations: Step-to pattern;Antalgic Gait velocity: decreased Gait velocity interpretation: <1.31 ft/sec, indicative of household ambulator   General Gait Details: pt with moderately antalgic gait pattern, able to support painful knee with arms and weight shift to the right.  Stairs            Wheelchair Mobility    Modified Rankin (Stroke Patients Only)       Balance Overall balance assessment: Needs assistance Sitting-balance support: Feet supported;No upper extremity supported Sitting balance-Leahy Scale: Good     Standing balance support: Bilateral upper extremity supported Standing balance-Leahy Scale: Poor Standing balance comment: needs support of RW and min guard assist from PT                             Pertinent Vitals/Pain Pain Assessment: Faces Faces Pain Scale: Hurts even more Pain Location: L knee Pain Descriptors / Indicators: Grimacing;Guarding Pain Intervention(s): Limited activity within patient's tolerance;Monitored during session;Repositioned    Home Living Family/patient expects to be discharged to:: Other (Comment) (post acute rehab) Living Arrangements: Alone                 Additional Comments: wears glasses for reading    Prior  Function Prior Level of Function : Independent/Modified Independent                     Hand Dominance   Dominant Hand: Right    Extremity/Trunk Assessment   Upper Extremity Assessment Upper Extremity Assessment: Overall WFL for tasks assessed    Lower Extremity Assessment Lower Extremity  Assessment: LLE deficits/detail LLE Deficits / Details: left leg with normal post op pain and weakness, ankle 3/5, knee 2/5 hip flexion 3-/5    Cervical / Trunk Assessment Cervical / Trunk Assessment: Normal  Communication   Communication: No difficulties  Cognition Arousal/Alertness: Awake/alert Behavior During Therapy: Anxious Overall Cognitive Status: Within Functional Limits for tasks assessed                                          General Comments General comments (skin integrity, edema, etc.): Pt a bit lightheaded in the chair at end of session BPs 91/58.  Leaned the chair back more and encouraged ankle pumps.    Exercises Total Joint Exercises Ankle Circles/Pumps: AROM;Both;20 reps Quad Sets: AROM;Left;10 reps Towel Squeeze: AROM;Both;10 reps Heel Slides: AAROM;Left;10 reps Long Arc Quad: AROM;AAROM;Left;10 reps   Assessment/Plan    PT Assessment Patient needs continued PT services  PT Problem List Decreased strength;Decreased range of motion;Decreased activity tolerance;Decreased balance;Decreased mobility;Decreased knowledge of use of DME;Pain       PT Treatment Interventions DME instruction;Stair training;Gait training;Functional mobility training;Therapeutic activities;Therapeutic exercise;Balance training;Patient/family education;Manual techniques;Modalities    PT Goals (Current goals can be found in the Care Plan section)  Acute Rehab PT Goals Patient Stated Goal: to go to post acute rehab for a couple of weeks to get mobile enough to feel safe to be home alone. PT Goal Formulation: With patient Time For Goal Achievement: 06/17/21 Potential to Achieve Goals: Good    Frequency 7X/week   Barriers to discharge Decreased caregiver support      Co-evaluation               AM-PAC PT "6 Clicks" Mobility  Outcome Measure Help needed turning from your back to your side while in a flat bed without using bedrails?: None Help needed moving  from lying on your back to sitting on the side of a flat bed without using bedrails?: None Help needed moving to and from a bed to a chair (including a wheelchair)?: A Little Help needed standing up from a chair using your arms (e.g., wheelchair or bedside chair)?: A Little Help needed to walk in hospital room?: A Little Help needed climbing 3-5 steps with a railing? : A Little 6 Click Score: 20    End of Session   Activity Tolerance: Patient limited by pain Patient left: in chair;with call bell/phone within reach   PT Visit Diagnosis: Muscle weakness (generalized) (M62.81);Difficulty in walking, not elsewhere classified (R26.2);Pain Pain - Right/Left: Left Pain - part of body: Knee    Time: 9357-0177 PT Time Calculation (min) (ACUTE ONLY): 42 min   Charges:   PT Evaluation $PT Eval Moderate Complexity: 1 Mod PT Treatments $Gait Training: 8-22 mins $Therapeutic Exercise: 8-22 mins   Verdene Lennert, PT, DPT  Acute Rehabilitation Ortho Tech Supervisor [phone] pager [phone]) [phone] office

## 2021-06-03 NOTE — H&P (Signed)
TOTAL KNEE ADMISSION H&P  Patient is being admitted for left total knee arthroplasty.  Subjective:  Chief Complaint:left knee pain.  HPI: Francisco Padilla, 70 y.o. male, has a history of pain and functional disability in the left knee due to arthritis and has failed non-surgical conservative treatments for greater than 12 weeks to includeNSAID's and/or analgesics, corticosteriod injections, flexibility and strengthening excercises, use of assistive devices, weight reduction as appropriate, and activity modification.  Onset of symptoms was gradual, starting 8 years ago with gradually worsening course since that time. The patient noted no past surgery on the left knee(s).  Patient currently rates pain in the left knee(s) at 8 out of 10 with activity. Patient has night pain, worsening of pain with activity and weight bearing, pain that interferes with activities of daily living, pain with passive range of motion, crepitus, and joint swelling.  Patient has evidence of subchondral cysts, subchondral sclerosis, periarticular osteophytes, joint subluxation, and joint space narrowing by imaging studies. This patient has had avascular necrosis of the knee. There is no active infection.  Patient Active Problem List   Diagnosis Date Noted   Longstanding persistent atrial fibrillation ([place]) 04/26/2021   Preop cardiovascular exam 04/26/2021   Orthostatic hypotension 02/20/2019   Chronic anticoagulation 03/05/2015   Abnormal chest x-ray with multiple lung nodules 12/14/2014   AS (aortic stenosis) 12/14/2014   Atrial flutter by electrocardiogram ([place]) 12/14/2014   CAD (coronary artery disease) 12/14/2014   DJD (degenerative joint disease) 12/14/2014   History of GI bleed 12/14/2014   Nephrolithiasis 12/14/2014   Other activity(E029.9) 12/14/2014   Prediabetes 12/14/2014   Thrombocytopenia ([place]) 12/14/2014   Arterial blood pressure decreased 10/28/2014   Atrial fibrillation ([place]) 09/02/2014   Morbid obesity  ([place]) 09/02/2014   AF (paroxysmal atrial fibrillation) ([place]) 08/03/2014   Gastroduodenal ulcer 08/03/2014   Apnea, sleep 08/03/2014   Glaucoma suspect 02/21/2013   Glaucoma suspect of both eyes 02/21/2013   Herpes 04/11/2012   Routine general medical examination at a health care facility 04/11/2012   DYSLIPIDEMIA 05/31/2009   DEPRESSION/ANXIETY 05/31/2009   ATTENTION DEFICIT DISORDER 05/31/2009   Obstructive sleep apnea 05/31/2009   GERD 05/31/2009   GOUT, HX OF 05/31/2009   Past Medical History:  Diagnosis Date   ADD (attention deficit disorder)    Anxiety 2007   Asthma as child   Asymptomatic gallstones    Atrial fibrillation ([place])    Atrial flutter ([place])    Depression    DJD (degenerative joint disease) of knee    Dysrhythmia    Chronic Atrial Fib- seeing Dr. Lenna Sciara [facility]   H/O peptic ulcer    past history with GI bleed- cauterization done throught scope   History of kidney stones    pt claims urology discovered a kidney stone last week 05/25/21   Hypercholesterolemia    Orthostatic hypotension    Pneumonia as child   Pneumothorax on left 05/20/2009   Prediabetes    none now   Pulmonary nodules    Sleep apnea    cpap set on 13   Thrombocytopenia ([place])    pt denies   Thyromegaly    Varicose veins     Past Surgical History:  Procedure Laterality Date   BIOPSY  04/18/2018   Procedure: BIOPSY;  Surgeon: Ronnette Juniper, MD;  Location: [facility]COPY;  Service: Gastroenterology;;   ESOPHAGOGASTRODUODENOSCOPY N/A 04/18/2018   Procedure: ESOPHAGOGASTRODUODENOSCOPY (EGD);  Surgeon: Ronnette Juniper, MD;  Location: [facility];  Service: Gastroenterology;  Laterality: N/A;  ESOPHAGOGASTRODUODENOSCOPY (EGD) WITH PROPOFOL N/A 01/05/2015   Procedure: ESOPHAGOGASTRODUODENOSCOPY (EGD) WITH PROPOFOL;  Surgeon: Garlan Fair, MD;  Location: [facility];  Service: Endoscopy;  Laterality: N/A;   GASTRIC ROUX-EN-Y     '08-[name] (weight stable around 302 #   KNEE ARTHROSCOPY      TONSILLECTOMY     ULNAR NERVE TRANSPOSITION Right 15 yrs ago    Current Facility-Administered Medications  Medication Dose Route Frequency Provider Last Rate Last Admin   ceFAZolin (ANCEF) 2-4 GM/100ML-% IVPB            ceFAZolin (ANCEF) IVPB 2g/100 mL premix  2 g Intravenous On Call to OR Suzan Slick, NP       lactated ringers infusion   Intravenous Continuous Hodierne, Adam, MD       tranexamic acid (CYKLOKAPRON) 1000MG /154mL IVPB            tranexamic acid (CYKLOKAPRON) 2,000 mg in sodium chloride 0.9 % 50 mL Topical Application  6,295 mg Topical To OR Newt Minion, MD       tranexamic acid (CYKLOKAPRON) IVPB 1,000 mg  1,000 mg Intravenous To OR Suzan Slick, NP       Allergies  Allergen Reactions   Aspirin     Avoid due to bariatric surgery   Nsaids     Avoid due to bariatric surgery   Oxycodone Hcl     Hallucinations, agitation     Social History   Tobacco Use   Smoking status: Never   Smokeless tobacco: Never  Substance Use Topics   Alcohol use: No    Alcohol/week: 0.0 standard drinks    Family History  Problem Relation Age of Onset   Heart disease Mother    Heart failure Mother    Heart disease Father    Heart attack Father    Stroke Neg Hx      Review of Systems  All other systems reviewed and are negative.  Objective:  Physical Exam  Vital signs in last 24 hours: Temp:  [98.4 F (36.9 C)] 98.4 F (36.9 C) (11/11 0623) Pulse Rate:  [78] 78 (11/11 0623) Resp:  [18] 18 (11/11 0623) BP: (126)/(81) 126/81 (11/11 0623) SpO2:  [98 %] 98 % (11/11 0623) Weight:  [103.4 kg] 103.4 kg (11/11 0623)  Labs:   Estimated body mass index is 28.5 kg/m as calculated from the following:   Height as of this encounter: 6\' 3"  (1.905 m).   Weight as of this encounter: 103.4 kg.   Imaging Review Plain radiographs demonstrate moderate degenerative joint disease of the left knee(s). The overall alignment ismild varus. The bone quality appears to be adequate  for age and reported activity level.      Assessment/Plan:  End stage arthritis, left knee   The patient history, physical examination, clinical judgment of the provider and imaging studies are consistent with end stage degenerative joint disease of the left knee(s) and total knee arthroplasty is deemed medically necessary. The treatment options including medical management, injection therapy arthroscopy and arthroplasty were discussed at length. The risks and benefits of total knee arthroplasty were presented and reviewed. The risks due to aseptic loosening, infection, stiffness, patella tracking problems, thromboembolic complications and other imponderables were discussed. The patient acknowledged the explanation, agreed to proceed with the plan and consent was signed. Patient is being admitted for inpatient treatment for surgery, pain control, PT, OT, prophylactic antibiotics, VTE prophylaxis, progressive ambulation and ADL's and discharge planning. The patient is planning to  be discharged to skilled nursing facility     Patient's anticipated LOS is less than 2 midnights, meeting these requirements: - Younger than 49 - Lives within 1 hour of care - Has a competent adult at home to recover with post-op recover - NO history of  - Chronic pain requiring opiods  - Diabetes  - Coronary Artery Disease  - Heart failure  - Heart attack  - Stroke  - DVT/VTE  - Cardiac arrhythmia  - Respiratory Failure/COPD  - Renal failure  - Anemia  - Advanced Liver disease

## 2021-06-04 MED ORDER — ENSURE MAX PROTEIN PO LIQD
11.0000 [oz_av] | Freq: Every day | ORAL | Status: DC
  Administered 2021-06-04 – 2021-06-10 (×6): 11 [oz_av] via ORAL
  Filled 2021-06-04: qty 330

## 2021-06-04 NOTE — Progress Notes (Signed)
Physical Therapy Treatment Patient Details Name: Francisco Padilla MRN: [id number] DOB: January 05, 1951 Today's Date: 06/04/2021   History of Present Illness 70 y.o. male admitted on 06/03/21 for L TKA, WBAT post op.  Pt with significant PMH ofADD, anxiety, A fib, A flutter, DJD, orthostatic hypotension, pre diabetes, gastric bypass, R ulnar nerve transposition.    PT Comments    Pt is progressing well with his gait and mobility, able to make it a short distance into the hallway with min assist overall and RW.  He was able to complete more of his HEP in the recliner chair at the end of his session and has ~55 degrees of knee flexion.  Propped with heel roll while up to encourage L knee extension.  PT will continue to follow acutely for safe mobility progression.  Recommendations for follow up therapy are one component of a multi-disciplinary discharge planning process, led by the attending physician.  Recommendations may be updated based on patient status, additional functional criteria and insurance authorization.  Follow Up Recommendations  Follow physician's recommendations for discharge plan and follow up therapies     Assistance Recommended at Discharge Set up Supervision/Assistance  Equipment Recommendations  Rolling walker (2 wheels)    Recommendations for Other Services       Precautions / Restrictions Precautions Precautions: Knee Precaution Booklet Issued: Yes (comment) Precaution Comments: knee exercise handout given, knee precaution and WBAT status reviewed Restrictions Weight Bearing Restrictions: No LLE Weight Bearing: Weight bearing as tolerated     Mobility  Bed Mobility Overal bed mobility: Needs Assistance Bed Mobility: Supine to Sit     Supine to sit: Min assist     General bed mobility comments: Min assist to help progress L leg over the side of the bed.    Transfers Overall transfer level: Needs assistance Equipment used: Rolling walker (2  wheels) Transfers: Sit to/from Stand Sit to Stand: Min assist;From elevated surface           General transfer comment: Min assist from elevated bed to RW, cues for safe hand placement during transitions, uncontrolled descent to sit down in lower recliner chair.    Ambulation/Gait Ambulation/Gait assistance: Min assist Gait Distance (Feet): 75 Feet Assistive device: Rolling walker (2 wheels) Gait Pattern/deviations: Step-to pattern;Antalgic Gait velocity: decreased Gait velocity interpretation: <1.8 ft/sec, indicate of risk for recurrent falls   General Gait Details: Pt needs min assist due to knee instability on L during stance, compensates well with bil UE support on RW and using correct/safest LE sequence (which requred occasional cues from PT to consistently perform).   Stairs             Wheelchair Mobility    Modified Rankin (Stroke Patients Only)       Balance Overall balance assessment: Needs assistance Sitting-balance support: No upper extremity supported;Feet supported Sitting balance-Leahy Scale: Good     Standing balance support: Bilateral upper extremity supported Standing balance-Leahy Scale: Poor Standing balance comment: needs support of RW and min guard assist from PT                            Cognition Arousal/Alertness: Awake/alert Behavior During Therapy: Anxious Overall Cognitive Status: Within Functional Limits for tasks assessed  Exercises Total Joint Exercises Ankle Circles/Pumps: AROM;Both;20 reps Quad Sets: AROM;Left;10 reps Towel Squeeze: AROM;Both;10 reps Short Arc QuadSinclair Ship;Left;10 reps Heel Slides: AAROM;Left;10 reps Hip ABduction/ADduction: AAROM;Left;10 reps Straight Leg Raises: AAROM;Left;10 reps    General Comments        Pertinent Vitals/Pain Pain Assessment: Faces Faces Pain Scale: Hurts whole lot Pain Location: L knee Pain Descriptors /  Indicators: Grimacing;Guarding Pain Intervention(s): Limited activity within patient's tolerance;Monitored during session;Repositioned    Home Living                          Prior Function            PT Goals (current goals can now be found in the care plan section) Acute Rehab PT Goals Patient Stated Goal: to go to post acute rehab for a couple of weeks to get mobile enough to feel safe to be home alone. Progress towards PT goals: Progressing toward goals    Frequency    7X/week      PT Plan Current plan remains appropriate    Co-evaluation              AM-PAC PT "6 Clicks" Mobility   Outcome Measure  Help needed turning from your back to your side while in a flat bed without using bedrails?: None Help needed moving from lying on your back to sitting on the side of a flat bed without using bedrails?: A Little Help needed moving to and from a bed to a chair (including a wheelchair)?: A Little Help needed standing up from a chair using your arms (e.g., wheelchair or bedside chair)?: A Little Help needed to walk in hospital room?: A Little Help needed climbing 3-5 steps with a railing? : A Lot 6 Click Score: 18    End of Session Equipment Utilized During Treatment: Gait belt Activity Tolerance: Patient limited by pain Patient left: in chair;with call bell/phone within reach   PT Visit Diagnosis: Muscle weakness (generalized) (M62.81);Difficulty in walking, not elsewhere classified (R26.2);Pain Pain - Right/Left: Left Pain - part of body: Knee     Time: [phone] (not charged one unit of time for chatting/social) PT Time Calculation (min) (ACUTE ONLY): 73 min  Charges:  $Gait Training: 23-37 mins $Therapeutic Exercise: 23-37 mins                    Verdene Lennert, PT, DPT  Acute Rehabilitation Ortho Tech Supervisor [phone] pager [phone]) [phone] office

## 2021-06-04 NOTE — NC FL2 (Deleted)
[place] LEVEL OF CARE SCREENING TOOL     IDENTIFICATION  Patient Name: Francisco Padilla Birthdate: March 24, 1951 Sex: male Admission Date (Current Location): 06/03/2021  [facility] and [place] Number:  Herbalist and Address:  [facility], [place] [address] [place], [place] [address]      Provider Number: [id number]  Attending Physician Name and Address:  Newt Minion, MD  Relative Name and Phone Number:  newsome,carol Denman George)   [phone]    Current Level of Care: Hospital Recommended Level of Care: [place] Prior Approval Number:    Date Approved/Denied:   PASRR Number: [id number]  Discharge Plan: SNF    Current Diagnoses: Patient Active Problem List   Diagnosis Date Noted   Total knee replacement status, 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   Unilateral primary osteoarthritis, left knee    Longstanding persistent atrial fibrillation ([place]) 04/26/2021   Preop cardiovascular exam 04/26/2021   Orthostatic hypotension 02/20/2019   Chronic anticoagulation 03/05/2015   Abnormal chest x-ray with multiple lung nodules 12/14/2014   AS (aortic stenosis) 12/14/2014   Atrial flutter by electrocardiogram ([place]) 12/14/2014   CAD (coronary artery disease) 12/14/2014   DJD (degenerative joint disease) 12/14/2014   History of GI bleed 12/14/2014   Nephrolithiasis 12/14/2014   Other activity(E029.9) 12/14/2014   Prediabetes 12/14/2014   Thrombocytopenia ([place]) 12/14/2014   Arterial blood pressure decreased 10/28/2014   Atrial fibrillation ([place]) 09/02/2014   Morbid obesity ([place]) 09/02/2014   AF (paroxysmal atrial fibrillation) ([place]) 08/03/2014   Gastroduodenal ulcer 08/03/2014   Apnea, sleep 08/03/2014   Glaucoma suspect 02/21/2013   Glaucoma suspect of both eyes 02/21/2013   Herpes 04/11/2012   Routine general medical examination at a health care facility 04/11/2012   DYSLIPIDEMIA 05/31/2009   DEPRESSION/ANXIETY 05/31/2009   ATTENTION  DEFICIT DISORDER 05/31/2009   Obstructive sleep apnea 05/31/2009   GERD 05/31/2009   GOUT, HX OF 05/31/2009    Orientation RESPIRATION BLADDER Height & Weight     Self, Time, Place, Situation  Normal Continent Weight: 228 lb (103.4 kg) Height:  6\' 3"  (190.5 cm)  BEHAVIORAL SYMPTOMS/MOOD NEUROLOGICAL BOWEL NUTRITION STATUS      Continent Diet (See DC Summary)  AMBULATORY STATUS COMMUNICATION OF NEEDS Skin   Limited Assist Verbally Surgical wounds (L Leg incision)                       Personal Care Assistance Level of Assistance  Bathing, Feeding, Dressing Bathing Assistance: Limited assistance Feeding assistance: Independent Dressing Assistance: Limited assistance     Functional Limitations Info  Sight, Hearing, Speech Sight Info: Adequate Hearing Info: Adequate Speech Info: Adequate    SPECIAL CARE FACTORS FREQUENCY  PT (By licensed PT), OT (By licensed OT)     PT Frequency: 5x a week OT Frequency: 5x a week            Contractures Contractures Info: Not present    Additional Factors Info  Code Status, Allergies Code Status Info: Full Allergies Info: Aspirin   Nsaids   Oxycodone Hcl           Current Medications (06/04/2021):  This is the current hospital active medication list Current Facility-Administered Medications  Medication Dose Route Frequency Provider Last Rate Last Admin   0.9 %  sodium chloride infusion   Intravenous Continuous Newt Minion, MD 10 mL/hr at 06/03/21 1704 New Bag at 06/03/21 1704   acetaminophen (TYLENOL) tablet 325-650 mg  325-650  mg Oral Q6H PRN Newt Minion, MD       alum & mag hydroxide-simeth (MAALOX/MYLANTA) 200-200-20 MG/5ML suspension 30 mL  30 mL Oral Q4H PRN Newt Minion, MD       apixaban Arne Cleveland) tablet 5 mg  5 mg Oral BID Skeet Simmer, RPH   5 mg at 06/04/21 1610   atorvastatin (LIPITOR) tablet 20 mg  20 mg Oral QHS Newt Minion, MD   20 mg at 06/03/21 2131   bisacodyl (DULCOLAX) suppository 10 mg  10  mg Rectal Daily PRN Newt Minion, MD       diphenhydrAMINE (BENADRYL) 12.5 MG/5ML elixir 12.5-25 mg  12.5-25 mg Oral Q4H PRN Newt Minion, MD       docusate sodium (COLACE) capsule 100 mg  100 mg Oral BID Newt Minion, MD   100 mg at 06/04/21 9604   fludrocortisone (FLORINEF) tablet 0.1 mg  0.1 mg Oral Daily Newt Minion, MD   0.1 mg at 06/04/21 5409   gabapentin (NEURONTIN) capsule 200 mg  200 mg Oral QHS Newt Minion, MD   200 mg at 06/03/21 2131   HYDROcodone-acetaminophen (NORCO) 7.5-325 MG per tablet 1-2 tablet  1-2 tablet Oral Q4H PRN Newt Minion, MD   1 tablet at 06/04/21 1217   HYDROcodone-acetaminophen (NORCO/VICODIN) 5-325 MG per tablet 1-2 tablet  1-2 tablet Oral Q4H PRN Newt Minion, MD   1 tablet at 06/04/21 1021   ketorolac (TORADOL) 15 MG/ML injection 15 mg  15 mg Intravenous Q6H PRN Newt Minion, MD   15 mg at 06/03/21 0937   latanoprost (XALATAN) 0.005 % ophthalmic solution 1 drop  1 drop Both Eyes QHS Newt Minion, MD   1 drop at 06/04/21 0026   LORazepam (ATIVAN) tablet 0.5 mg  0.5 mg Oral Q4H Newt Minion, MD   0.5 mg at 06/04/21 1217   menthol-cetylpyridinium (CEPACOL) lozenge 3 mg  1 lozenge Oral PRN Newt Minion, MD       Or   phenol (CHLORASEPTIC) mouth spray 1 spray  1 spray Mouth/Throat PRN Newt Minion, MD       methocarbamol (ROBAXIN) tablet 500 mg  500 mg Oral Q6H PRN Newt Minion, MD   500 mg at 06/03/21 0920   Or   methocarbamol (ROBAXIN) 500 mg in dextrose 5 % 50 mL IVPB  500 mg Intravenous Q6H PRN Newt Minion, MD       metoCLOPramide (REGLAN) tablet 5-10 mg  5-10 mg Oral Q8H PRN Newt Minion, MD       Or   metoCLOPramide (REGLAN) injection 5-10 mg  5-10 mg Intravenous Q8H PRN Newt Minion, MD       morphine 2 MG/ML injection 0.5-1 mg  0.5-1 mg Intravenous Q2H PRN Newt Minion, MD   1 mg at 06/03/21 2307   ondansetron (ZOFRAN) tablet 4 mg  4 mg Oral Q6H PRN Newt Minion, MD       Or   ondansetron [facility]) injection 4 mg  4 mg  Intravenous Q6H PRN Newt Minion, MD       pantoprazole (PROTONIX) EC tablet 80 mg  80 mg Oral QHS Newt Minion, MD   80 mg at 06/03/21 2131   polyethylene glycol (MIRALAX / GLYCOLAX) packet 17 g  17 g Oral Daily PRN Newt Minion, MD       sertraline (ZOLOFT) tablet 100 mg  100  mg Oral QHS Newt Minion, MD   100 mg at 06/03/21 2132   zolpidem (AMBIEN) tablet 5 mg  5 mg Oral QHS PRN Newt Minion, MD         Discharge Medications: Please see discharge summary for a list of discharge medications.  Relevant Imaging Results:  Relevant Lab Results:   Additional Information SSN: [id number]  Reece Agar, [place]

## 2021-06-04 NOTE — TOC Initial Note (Addendum)
Transition of Care [facility]) - Initial/Assessment Note    Patient Details  Name: Francisco Padilla MRN: [id number] Date of Birth: 05/16/1951  [facility]) CM/SW Contact:    Bartholomew Crews, RN Phone Number: [phone] 06/04/2021, 12:23 PM  Clinical Narrative:                  Spoke with patient at the bedside to discuss plans for transition home. PTA home alone.   Patient is familiar with [facility] inpatient rehab and is interested in going there for rehab for a couple weeks before returning home. Discussed the difference between inpatient rehab and skilled nursing facility. Patient verbalized understanding and is agreeable to being faxed out to SNFs in [facility] as an alternative to inpatient rehab.   Update: Spoke with [place] at [facility]. Patient had reached out to [facility] about rehab at their facilty earlier. However, per Medicare guidelines patient is not eligible for inpatient rehab.   TOC folowing for transition needs.    Expected Discharge Plan: IP Rehab Facility Barriers to Discharge: Continued Medical Work up   Patient Goals and CMS Choice Patient states their goals for this hospitalization and ongoing recovery are:: rehab for a couple weeks then return home CMS Medicare.gov Compare Post Acute Care list provided to:: Patient Choice offered to / list presented to : Patient  Expected Discharge Plan and Services Expected Discharge Plan: [place] In-house Referral: Clinical Social Work Discharge Planning Services: CM Consult Post Acute Care Choice: IP Rehab Living arrangements for the past 2 months: Single Family Home                                      Prior Living Arrangements/Services Living arrangements for the past 2 months: Single Family Home Lives with:: Self Patient language and need for interpreter reviewed:: Yes        Need for Family Participation in Patient Care: Yes (Comment) Care giver support system in  place?: No (comment)   Criminal Activity/Legal Involvement Pertinent to Current Situation/Hospitalization: No - Comment as needed  Activities of Daily Living Home Assistive Devices/Equipment: (P) Grab bars in shower ADL Screening (condition at time of admission) Patient's cognitive ability adequate to safely complete daily activities?: (P) Yes Is the patient deaf or have difficulty hearing?: (P) No Does the patient have difficulty seeing, even when wearing glasses/contacts?: (P) No Does the patient have difficulty concentrating, remembering, or making decisions?: (P) Yes Patient able to express need for assistance with ADLs?: (P) Yes Does the patient have difficulty dressing or bathing?: (P) No Independently performs ADLs?: (P) Yes (appropriate for developmental age) Does the patient have difficulty walking or climbing stairs?: (P) Yes Weakness of Legs: (P) Both Weakness of Arms/Hands: (P) None  Permission Sought/Granted                  Emotional Assessment Appearance:: Appears stated age Attitude/Demeanor/Rapport: Engaged Affect (typically observed): Accepting Orientation: : Oriented to Self, Oriented to Place, Oriented to  Time, Oriented to Situation Alcohol / Substance Use: Not Applicable Psych Involvement: No (comment)  Admission diagnosis:  Total knee replacement status, left [Z96.652] Patient Active Problem List   Diagnosis Date Noted   Total knee replacement status, 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   Unilateral primary osteoarthritis, left knee    Longstanding persistent atrial fibrillation ([place]) 04/26/2021   Preop cardiovascular exam 04/26/2021   Orthostatic hypotension 02/20/2019  Chronic anticoagulation 03/05/2015   Abnormal chest x-ray with multiple lung nodules 12/14/2014   AS (aortic stenosis) 12/14/2014   Atrial flutter by electrocardiogram ([place]) 12/14/2014   CAD (coronary artery disease) 12/14/2014   DJD (degenerative joint disease) 12/14/2014   History of GI bleed  12/14/2014   Nephrolithiasis 12/14/2014   Other activity(E029.9) 12/14/2014   Prediabetes 12/14/2014   Thrombocytopenia ([place]) 12/14/2014   Arterial blood pressure decreased 10/28/2014   Atrial fibrillation ([place]) 09/02/2014   Morbid obesity ([place]) 09/02/2014   AF (paroxysmal atrial fibrillation) ([place]) 08/03/2014   Gastroduodenal ulcer 08/03/2014   Apnea, sleep 08/03/2014   Glaucoma suspect 02/21/2013   Glaucoma suspect of both eyes 02/21/2013   Herpes 04/11/2012   Routine general medical examination at a health care facility 04/11/2012   DYSLIPIDEMIA 05/31/2009   DEPRESSION/ANXIETY 05/31/2009   ATTENTION DEFICIT DISORDER 05/31/2009   Obstructive sleep apnea 05/31/2009   GERD 05/31/2009   GOUT, HX OF 05/31/2009   PCP:  Lajean Manes, MD Pharmacy:   [facility], [place] [address] Phone: [phone] Fax: [phone]  [id number] [place], [place] - [address] [place] [address] Phone: [phone] Fax: [phone]     Social Determinants of Health (SDOH) Interventions    Readmission Risk Interventions No flowsheet data found.

## 2021-06-04 NOTE — Progress Notes (Signed)
Patient ID: Francisco Padilla, male   DOB: 12/01/1950, 70 y.o.   MRN: [id number] Patient is postoperative day 1 left total knee arthroplasty.  He appears to be progressing well with therapy.  Patient expressed interest for discharge to rehab facility.  Patient prefers [facility].

## 2021-06-04 NOTE — NC FL2 (Signed)
[place] LEVEL OF CARE SCREENING TOOL     IDENTIFICATION  Patient Name: Francisco Padilla Birthdate: 08/30/1950 Sex: male Admission Date (Current Location): 06/03/2021  [facility] and [place] Number:  Herbalist and Address:  The [place]. [facility], [place] [address], [place], [place] [address]      Provider Number: [id number]  Attending Physician Name and Address:  Newt Minion, MD  Relative Name and Phone Number:  newsome,carol Denman George)   [phone]    Current Level of Care: Hospital Recommended Level of Care: [place] Prior Approval Number:    Date Approved/Denied:   PASRR Number: [id number]  Discharge Plan: SNF    Current Diagnoses: Patient Active Problem List   Diagnosis Date Noted   Total knee replacement status, 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   Unilateral primary osteoarthritis, left knee    Longstanding persistent atrial fibrillation ([place]) 04/26/2021   Preop cardiovascular exam 04/26/2021   Orthostatic hypotension 02/20/2019   Chronic anticoagulation 03/05/2015   Abnormal chest x-ray with multiple lung nodules 12/14/2014   AS (aortic stenosis) 12/14/2014   Atrial flutter by electrocardiogram ([place]) 12/14/2014   CAD (coronary artery disease) 12/14/2014   DJD (degenerative joint disease) 12/14/2014   History of GI bleed 12/14/2014   Nephrolithiasis 12/14/2014   Other activity(E029.9) 12/14/2014   Prediabetes 12/14/2014   Thrombocytopenia ([place]) 12/14/2014   Arterial blood pressure decreased 10/28/2014   Atrial fibrillation ([place]) 09/02/2014   Morbid obesity ([place]) 09/02/2014   AF (paroxysmal atrial fibrillation) ([place]) 08/03/2014   Gastroduodenal ulcer 08/03/2014   Apnea, sleep 08/03/2014   Glaucoma suspect 02/21/2013   Glaucoma suspect of both eyes 02/21/2013   Herpes 04/11/2012   Routine general medical examination at a health care facility 04/11/2012   DYSLIPIDEMIA 05/31/2009   DEPRESSION/ANXIETY 05/31/2009   ATTENTION  DEFICIT DISORDER 05/31/2009   Obstructive sleep apnea 05/31/2009   GERD 05/31/2009   GOUT, HX OF 05/31/2009    Orientation RESPIRATION BLADDER Height & Weight     Self, Time, Place, Situation  Normal Continent Weight: 228 lb (103.4 kg) Height:  6\' 3"  (190.5 cm)  BEHAVIORAL SYMPTOMS/MOOD NEUROLOGICAL BOWEL NUTRITION STATUS      Continent Diet (See DC Summary)  AMBULATORY STATUS COMMUNICATION OF NEEDS Skin   Limited Assist Verbally Surgical wounds (L Leg incision)                       Personal Care Assistance Level of Assistance  Bathing, Feeding, Dressing Bathing Assistance: Limited assistance Feeding assistance: Independent Dressing Assistance: Limited assistance     Functional Limitations Info  Sight, Hearing, Speech Sight Info: Adequate Hearing Info: Adequate Speech Info: Adequate    SPECIAL CARE FACTORS FREQUENCY  PT (By licensed PT), OT (By licensed OT)     PT Frequency: 5x a week OT Frequency: 5x a week            Contractures Contractures Info: Not present    Additional Factors Info  Code Status, Allergies Code Status Info: Full Allergies Info: Aspirin   Nsaids   Oxycodone Hcl           Current Medications (06/04/2021):  This is the current hospital active medication list Current Facility-Administered Medications  Medication Dose Route Frequency Provider Last Rate Last Admin   0.9 %  sodium chloride infusion   Intravenous Continuous Newt Minion, MD 10 mL/hr at 06/03/21 1704 New Bag at 06/03/21 1704   acetaminophen (TYLENOL) tablet 325-650 mg  325-650  mg Oral Q6H PRN Newt Minion, MD       alum & mag hydroxide-simeth (MAALOX/MYLANTA) 200-200-20 MG/5ML suspension 30 mL  30 mL Oral Q4H PRN Newt Minion, MD       apixaban Arne Cleveland) tablet 5 mg  5 mg Oral BID Skeet Simmer, RPH   5 mg at 06/04/21 0165   atorvastatin (LIPITOR) tablet 20 mg  20 mg Oral QHS Newt Minion, MD   20 mg at 06/03/21 2131   bisacodyl (DULCOLAX) suppository 10 mg  10  mg Rectal Daily PRN Newt Minion, MD       diphenhydrAMINE (BENADRYL) 12.5 MG/5ML elixir 12.5-25 mg  12.5-25 mg Oral Q4H PRN Newt Minion, MD       docusate sodium (COLACE) capsule 100 mg  100 mg Oral BID Newt Minion, MD   100 mg at 06/04/21 5374   fludrocortisone (FLORINEF) tablet 0.1 mg  0.1 mg Oral Daily Newt Minion, MD   0.1 mg at 06/04/21 8270   gabapentin (NEURONTIN) capsule 200 mg  200 mg Oral QHS Newt Minion, MD   200 mg at 06/03/21 2131   HYDROcodone-acetaminophen (NORCO) 7.5-325 MG per tablet 1-2 tablet  1-2 tablet Oral Q4H PRN Newt Minion, MD   1 tablet at 06/04/21 1217   HYDROcodone-acetaminophen (NORCO/VICODIN) 5-325 MG per tablet 1-2 tablet  1-2 tablet Oral Q4H PRN Newt Minion, MD   1 tablet at 06/04/21 1021   ketorolac (TORADOL) 15 MG/ML injection 15 mg  15 mg Intravenous Q6H PRN Newt Minion, MD   15 mg at 06/03/21 0937   latanoprost (XALATAN) 0.005 % ophthalmic solution 1 drop  1 drop Both Eyes QHS Newt Minion, MD   1 drop at 06/04/21 0026   LORazepam (ATIVAN) tablet 0.5 mg  0.5 mg Oral Q4H Newt Minion, MD   0.5 mg at 06/04/21 1217   menthol-cetylpyridinium (CEPACOL) lozenge 3 mg  1 lozenge Oral PRN Newt Minion, MD       Or   phenol (CHLORASEPTIC) mouth spray 1 spray  1 spray Mouth/Throat PRN Newt Minion, MD       methocarbamol (ROBAXIN) tablet 500 mg  500 mg Oral Q6H PRN Newt Minion, MD   500 mg at 06/03/21 0920   Or   methocarbamol (ROBAXIN) 500 mg in dextrose 5 % 50 mL IVPB  500 mg Intravenous Q6H PRN Newt Minion, MD       metoCLOPramide (REGLAN) tablet 5-10 mg  5-10 mg Oral Q8H PRN Newt Minion, MD       Or   metoCLOPramide (REGLAN) injection 5-10 mg  5-10 mg Intravenous Q8H PRN Newt Minion, MD       morphine 2 MG/ML injection 0.5-1 mg  0.5-1 mg Intravenous Q2H PRN Newt Minion, MD   1 mg at 06/03/21 2307   ondansetron (ZOFRAN) tablet 4 mg  4 mg Oral Q6H PRN Newt Minion, MD       Or   ondansetron [facility]) injection 4 mg  4 mg  Intravenous Q6H PRN Newt Minion, MD       pantoprazole (PROTONIX) EC tablet 80 mg  80 mg Oral QHS Newt Minion, MD   80 mg at 06/03/21 2131   polyethylene glycol (MIRALAX / GLYCOLAX) packet 17 g  17 g Oral Daily PRN Newt Minion, MD       sertraline (ZOLOFT) tablet 100 mg  100  mg Oral QHS Newt Minion, MD   100 mg at 06/03/21 2132   zolpidem (AMBIEN) tablet 5 mg  5 mg Oral QHS PRN Newt Minion, MD         Discharge Medications: Please see discharge summary for a list of discharge medications.  Relevant Imaging Results:  Relevant Lab Results:   Additional Information SSN: [id number]  Reece Agar, [place]

## 2021-06-04 NOTE — TOC Progression Note (Signed)
Transition of Care [facility]) - Progression Note    Patient Details  Name: Francisco Padilla MRN: [id number] Date of Birth: 03-Jun-1951  Transition of Care [facility]) CM/SW [place], [place] Phone Number: 06/04/2021, 2:08 PM  Clinical Narrative:     CSW received a consult from [facility] to fax pt out to other SNF in [facility].  CSW faxed pt out to [place] ([facility]), [facility],  [facility], [place].  TOC will continue to assist with disposition planning.  Expected Discharge Plan: IP Rehab Facility Barriers to Discharge: Continued Medical Work up  Expected Discharge Plan and Services Expected Discharge Plan: [place] In-house Referral: Clinical Social Work Discharge Planning Services: CM Consult Post Acute Care Choice: IP Rehab Living arrangements for the past 2 months: Single Family Home                                       Social Determinants of Health (SDOH) Interventions    Readmission Risk Interventions No flowsheet data found.

## 2021-06-04 NOTE — TOC Progression Note (Signed)
Transition of Care [facility]) - Progression Note    Patient Details  Name: Francisco Padilla MRN: [id number] Date of Birth: [birth year]  Transition [facility]) CM/SW Contact  Bartholomew Crews, RN Phone Number: [phone] 06/04/2021, 3:45 PM  Clinical Narrative:     Spoke with patient at the bedside this afternoon. Discussed that a single joint does not meet medicare guidelines for inpatient rehabilitation. Patient considering paying out of pocket for rehabilitation.   Discussed choice for skilled nursing facilities. Reviewed skilled nursing compare on [email or web address] Additional facilities contacted for referrals - pending.   Expected Discharge Plan: IP Rehab Facility Barriers to Discharge: Continued Medical Work up  Expected Discharge Plan and Services Expected Discharge Plan: [place] In-house Referral: Clinical Social Work Discharge Planning Services: CM Consult Post Acute Care Choice: IP Rehab Living arrangements for the past 2 months: Single Family Home                                       Social Determinants of Health (SDOH) Interventions    Readmission Risk Interventions No flowsheet data found.

## 2021-06-04 NOTE — Care Management Obs Status (Signed)
[place] NOTIFICATION   Patient Details  Name: Francisco Padilla MRN: [id number] Date of Birth: [birth year]   Medicare Observation Status Notification Given:  Yes    Bartholomew Crews, RN 06/04/2021, 12:09 PM

## 2021-06-05 DIAGNOSIS — K5730 Diverticulosis of large intestine without perforation or abscess without bleeding: Secondary | ICD-10-CM | POA: Diagnosis not present

## 2021-06-05 DIAGNOSIS — I350 Nonrheumatic aortic (valve) stenosis: Secondary | ICD-10-CM | POA: Diagnosis not present

## 2021-06-05 DIAGNOSIS — J45909 Unspecified asthma, uncomplicated: Secondary | ICD-10-CM | POA: Diagnosis not present

## 2021-06-05 DIAGNOSIS — Z96652 Presence of left artificial knee joint: Secondary | ICD-10-CM | POA: Diagnosis not present

## 2021-06-05 DIAGNOSIS — M109 Gout, unspecified: Secondary | ICD-10-CM | POA: Diagnosis not present

## 2021-06-05 DIAGNOSIS — M1712 Unilateral primary osteoarthritis, left knee: Secondary | ICD-10-CM | POA: Diagnosis present

## 2021-06-05 DIAGNOSIS — G4733 Obstructive sleep apnea (adult) (pediatric): Secondary | ICD-10-CM | POA: Diagnosis present

## 2021-06-05 DIAGNOSIS — Z885 Allergy status to narcotic agent status: Secondary | ICD-10-CM | POA: Diagnosis not present

## 2021-06-05 DIAGNOSIS — N5089 Other specified disorders of the male genital organs: Secondary | ICD-10-CM | POA: Diagnosis not present

## 2021-06-05 DIAGNOSIS — N1330 Unspecified hydronephrosis: Secondary | ICD-10-CM | POA: Diagnosis not present

## 2021-06-05 DIAGNOSIS — R7303 Prediabetes: Secondary | ICD-10-CM | POA: Diagnosis not present

## 2021-06-05 DIAGNOSIS — F332 Major depressive disorder, recurrent severe without psychotic features: Secondary | ICD-10-CM | POA: Diagnosis not present

## 2021-06-05 DIAGNOSIS — F410 Panic disorder [episodic paroxysmal anxiety] without agoraphobia: Secondary | ICD-10-CM | POA: Diagnosis not present

## 2021-06-05 DIAGNOSIS — I959 Hypotension, unspecified: Secondary | ICD-10-CM | POA: Diagnosis not present

## 2021-06-05 DIAGNOSIS — H40009 Preglaucoma, unspecified, unspecified eye: Secondary | ICD-10-CM | POA: Diagnosis not present

## 2021-06-05 DIAGNOSIS — F900 Attention-deficit hyperactivity disorder, predominantly inattentive type: Secondary | ICD-10-CM | POA: Diagnosis not present

## 2021-06-05 DIAGNOSIS — Z8711 Personal history of peptic ulcer disease: Secondary | ICD-10-CM | POA: Diagnosis not present

## 2021-06-05 DIAGNOSIS — M1990 Unspecified osteoarthritis, unspecified site: Secondary | ICD-10-CM | POA: Diagnosis not present

## 2021-06-05 DIAGNOSIS — M25762 Osteophyte, left knee: Secondary | ICD-10-CM | POA: Diagnosis present

## 2021-06-05 DIAGNOSIS — N200 Calculus of kidney: Secondary | ICD-10-CM | POA: Diagnosis not present

## 2021-06-05 DIAGNOSIS — R031 Nonspecific low blood-pressure reading: Secondary | ICD-10-CM | POA: Diagnosis not present

## 2021-06-05 DIAGNOSIS — N201 Calculus of ureter: Secondary | ICD-10-CM | POA: Diagnosis present

## 2021-06-05 DIAGNOSIS — I4892 Unspecified atrial flutter: Secondary | ICD-10-CM | POA: Diagnosis not present

## 2021-06-05 DIAGNOSIS — I4811 Longstanding persistent atrial fibrillation: Secondary | ICD-10-CM | POA: Diagnosis present

## 2021-06-05 DIAGNOSIS — Z886 Allergy status to analgesic agent status: Secondary | ICD-10-CM | POA: Diagnosis not present

## 2021-06-05 DIAGNOSIS — F418 Other specified anxiety disorders: Secondary | ICD-10-CM | POA: Diagnosis not present

## 2021-06-05 DIAGNOSIS — K219 Gastro-esophageal reflux disease without esophagitis: Secondary | ICD-10-CM | POA: Diagnosis not present

## 2021-06-05 DIAGNOSIS — Z87442 Personal history of urinary calculi: Secondary | ICD-10-CM | POA: Diagnosis not present

## 2021-06-05 DIAGNOSIS — F411 Generalized anxiety disorder: Secondary | ICD-10-CM | POA: Diagnosis not present

## 2021-06-05 DIAGNOSIS — F5101 Primary insomnia: Secondary | ICD-10-CM | POA: Diagnosis not present

## 2021-06-05 DIAGNOSIS — E785 Hyperlipidemia, unspecified: Secondary | ICD-10-CM | POA: Diagnosis not present

## 2021-06-05 DIAGNOSIS — Z888 Allergy status to other drugs, medicaments and biological substances status: Secondary | ICD-10-CM | POA: Diagnosis not present

## 2021-06-05 DIAGNOSIS — I2510 Atherosclerotic heart disease of native coronary artery without angina pectoris: Secondary | ICD-10-CM | POA: Diagnosis present

## 2021-06-05 DIAGNOSIS — Z8249 Family history of ischemic heart disease and other diseases of the circulatory system: Secondary | ICD-10-CM | POA: Diagnosis not present

## 2021-06-05 DIAGNOSIS — Z471 Aftercare following joint replacement surgery: Secondary | ICD-10-CM | POA: Diagnosis not present

## 2021-06-05 DIAGNOSIS — F902 Attention-deficit hyperactivity disorder, combined type: Secondary | ICD-10-CM | POA: Diagnosis not present

## 2021-06-05 NOTE — Plan of Care (Signed)

## 2021-06-05 NOTE — Progress Notes (Signed)
Physical Therapy Treatment Patient Details Name: Francisco Padilla MRN: [id number] DOB: 07-27-1950 Today's Date: 06/05/2021   History of Present Illness 70 y.o. male admitted on 06/03/21 for L TKA, WBAT post op.  Pt with significant PMH ofADD, anxiety, A fib, A flutter, DJD, orthostatic hypotension, pre diabetes, gastric bypass, R ulnar nerve transposition.    PT Comments    Continuing work on functional mobility and activity tolerance;  Session focused on progressive amb, and pt showing improving L stance stability; reinforced knee precautions, pt receptive  Recommendations for follow up therapy are one component of a multi-disciplinary discharge planning process, led by the attending physician.  Recommendations may be updated based on patient status, additional functional criteria and insurance authorization.  Follow Up Recommendations  Follow physician's recommendations for discharge plan and follow up therapies     Assistance Recommended at Discharge Set up Supervision/Assistance  Equipment Recommendations  Rolling walker (2 wheels)    Recommendations for Other Services       Precautions / Restrictions Precautions Precautions: Knee Precaution Booklet Issued: Yes (comment) Precaution Comments: Pt educated to not allow any pillow or bolster under knee for healing with optimal range of motion.  Restrictions LLE Weight Bearing: Weight bearing as tolerated     Mobility  Bed Mobility Overal bed mobility: Needs Assistance Bed Mobility: Supine to Sit     Supine to sit: Min assist     General bed mobility comments: Min assist to help progress L leg over the side of the bed.    Transfers Overall transfer level: Needs assistance Equipment used: Rolling walker (2 wheels) Transfers: Sit to/from Stand Sit to Stand: Min assist;From elevated surface           General transfer comment: Min assist from elevated bed to RW, cues for safe hand placement during transitions,  uncontrolled descent to sit down in lower recliner chair.    Ambulation/Gait Ambulation/Gait assistance: Min assist Gait Distance (Feet): 100 Feet Assistive device: Rolling walker (2 wheels) Gait Pattern/deviations: Step-through pattern (emerging) Gait velocity: decreased     General Gait Details: Cues to stand tall on LLE, and focus on pushing heel into the floor during L loading response into stance to activate quad for better knee control   Stairs             Wheelchair Mobility    Modified Rankin (Stroke Patients Only)       Balance     Sitting balance-Leahy Scale: Good       Standing balance-Leahy Scale: Poor Standing balance comment: needs support of RW and min guard assist from PT                            Cognition Arousal/Alertness: Awake/alert Behavior During Therapy: [facility] for tasks assessed/performed;Anxious Overall Cognitive Status: Within Functional Limits for tasks assessed                                          Exercises Total Joint Exercises Ankle Circles/Pumps: AROM;Both;20 reps Quad Sets: AROM;Left;10 reps Straight Leg Raises: AAROM;Left;10 reps    General Comments        Pertinent Vitals/Pain Pain Assessment: Faces Faces Pain Scale: Hurts little more Pain Location: L knee Pain Descriptors / Indicators: Grimacing;Guarding Pain Intervention(s): Monitored during session;Premedicated before session    Home Living  Prior Function            PT Goals (current goals can now be found in the care plan section) Acute Rehab PT Goals Patient Stated Goal: to go to post acute rehab for a couple of weeks to get mobile enough to feel safe to be home alone. PT Goal Formulation: With patient Time For Goal Achievement: 06/17/21 Potential to Achieve Goals: Good Progress towards PT goals: Progressing toward goals    Frequency    7X/week      PT Plan Current plan  remains appropriate    Co-evaluation              AM-PAC PT "6 Clicks" Mobility   Outcome Measure  Help needed turning from your back to your side while in a flat bed without using bedrails?: None Help needed moving from lying on your back to sitting on the side of a flat bed without using bedrails?: A Little Help needed moving to and from a bed to a chair (including a wheelchair)?: A Little Help needed standing up from a chair using your arms (e.g., wheelchair or bedside chair)?: A Little Help needed to walk in hospital room?: A Little Help needed climbing 3-5 steps with a railing? : A Lot 6 Click Score: 18    End of Session Equipment Utilized During Treatment: Gait belt Activity Tolerance: Patient tolerated treatment well Patient left: in chair;with call bell/phone within reach;with chair alarm set Nurse Communication: Mobility status PT Visit Diagnosis: Muscle weakness (generalized) (M62.81);Difficulty in walking, not elsewhere classified (R26.2);Pain Pain - Right/Left: Left Pain - part of body: Knee     Time: 9292-4462 PT Time Calculation (min) (ACUTE ONLY): 47 min  Charges:  $Gait Training: 23-37 mins $Therapeutic Activity: 8-22 mins                     Roney Marion, PT  Acute Rehabilitation Services Pager [phone] Office [phone]    [name] 06/05/2021, 5:33 PM

## 2021-06-05 NOTE — Progress Notes (Addendum)
Subjective: 2 Days Post-Op Procedure(s) (LRB): LEFT TOTAL KNEE ARTHROPLASTY (Left) Patient reports pain as mild.    Objective: Vital signs in last 24 hours: Temp:  [97.8 F (36.6 C)-98.7 F (37.1 C)] 98.7 F (37.1 C) (11/13 0855) Pulse Rate:  [82-102] 95 (11/13 0855) Resp:  [18] 18 (11/13 0445) BP: (108-151)/(75-88) 108/75 (11/13 0855) SpO2:  [98 %-100 %] 100 % (11/13 0855)  Intake/Output from previous day: 11/12 0701 - 11/13 0700 In: 1019.3 [P.O.:600; I.V.:219.3; IV Piggyback:200] Out: 1750 [Urine:1750] Intake/Output this shift: No intake/output data recorded.  No results for input(s): HGB in the last 72 hours. No results for input(s): WBC, RBC, HCT, PLT in the last 72 hours. No results for input(s): NA, K, CL, CO2, BUN, CREATININE, GLUCOSE, CALCIUM in the last 72 hours. No results for input(s): LABPT, INR in the last 72 hours.  Neurologically intact Neurovascular intact Sensation intact distally Intact pulses distally Dorsiflexion/Plantar flexion intact Incision: dressing C/D/I No cellulitis present Compartment soft   Assessment/Plan: 2 Days Post-Op Procedure(s) (LRB): LEFT TOTAL KNEE ARTHROPLASTY (Left) Advance diet Up with therapy D/C IV fluids Discharge to SNF- once bed available.  He does not have any support at home.  [facility] [place] WBAT LLE    Anticipated LOS equal to or greater than 2 midnights due to - Age 24 and older with one or more of the following:  - Obesity  - Expected need for hospital services (PT, OT, Nursing) required for safe  discharge  - Anticipated need for postoperative skilled nursing care or inpatient rehab  - hx of a-fibb OR   - Unanticipated findings during/Post Surgery: Slow post-op progression: GI, pain control, mobility  - Patient is a high risk of re-admission due to: Barriers to post-acute care (logistical, no family support in home)   Aundra Dubin 06/05/2021, 9:48 AM

## 2021-06-06 ENCOUNTER — Inpatient Hospital Stay (HOSPITAL_COMMUNITY): Payer: Medicare Other

## 2021-06-06 ENCOUNTER — Encounter (HOSPITAL_COMMUNITY): Payer: Self-pay | Admitting: Orthopedic Surgery

## 2021-06-06 MED ORDER — IOHEXOL 9 MG/ML PO SOLN
500.0000 mL | ORAL | Status: AC
  Administered 2021-06-06 (×2): 500 mL via ORAL

## 2021-06-06 MED ORDER — PHENYLEPHRINE HCL-NACL 20-0.9 MG/250ML-% IV SOLN
INTRAVENOUS | Status: AC
  Filled 2021-06-06: qty 500

## 2021-06-06 MED ORDER — IOHEXOL 300 MG/ML  SOLN
100.0000 mL | Freq: Once | INTRAMUSCULAR | Status: AC | PRN
  Administered 2021-06-06: 100 mL via INTRAVENOUS

## 2021-06-06 NOTE — Progress Notes (Signed)
Patient ID: Francisco Padilla, male   DOB: 1950-08-07, 70 y.o.   MRN: [id number] Patient is postoperative day 3 left total knee arthroplasty.  Has had a CT scan ordered by Dr. Diona Fanti to follow-up for kidney stones.  Patient states that he would like to discharge to skilled nursing facility in [place].  Patient was able to ambulate to the end of the hall with therapy yesterday.

## 2021-06-06 NOTE — Telephone Encounter (Signed)
This message was from Friday. The pt has since spoken with social worker at the hospital and has been advised that [facility] is an IP rehab and his total joint replacement does not meet the qualifications of admission and that he can pay out of pocket for this is that is what he is wanting to do. This was discussed with pt in the hospital on saturday.

## 2021-06-06 NOTE — Progress Notes (Signed)
I am [facility] Urologist. He as a soft tissue mass associated w/ Lt seminal vesical--found on CT to evaluate Lt ureteral stone ~ 1 week ago. Radiologist recommended contrasted CT. I communicated my desire to obtain CT while pt in hospital for orthopedic surgery (pt request). I put order in.

## 2021-06-06 NOTE — TOC Progression Note (Addendum)
Transition of Care [facility]) - Progression Note    Patient Details  Name: Francisco Padilla MRN: [id number] Date of Birth: Feb 17, 1951  [facility] CM/SW Contact  Bartholomew Crews, RN Phone Number: [phone] 06/06/2021, 12:18 PM  Clinical Narrative:     Spoke with patient at the bedside to discuss transition planning. Patient stated that prior to admission he spoke with Ashok Norris at [facility] about getting rehab following his knee surgery. Patient stated that Ca[facility] Griffins requested referral be sent in order to review his case for medical necessity and bed availability. Patient requesting referral be sent for consideration. Spoke with rehab hospital liason, Denyse Amass, who requested referral be sent. After reviewing case, Denyse Amass confirmed that patient does not meet criteria/medical necessity for inpatient rehab.   Discussed SNF bed offers - patient asked about [facility]. Called [facility] for fax number for referral [phone], and left voice mail for admissions director, Baird Lyons, [phone].   Update 1415: Provided medicare.gov rating information for facilities offering beds. Patient asked about going to [facility] as private - spoke with Denyse Amass who stated that typically private pay still follows insurance guidelines; however, she will check to see if this might be an option.   Expected Discharge Plan: IP Rehab Facility Barriers to Discharge: Continued Medical Work up  Expected Discharge Plan and Services Expected Discharge Plan: [place] In-house Referral: Clinical Social Work Discharge Planning Services: CM Consult Post Acute Care Choice: IP Rehab Living arrangements for the past 2 months: Single Family Home                                       Social Determinants of Health (SDOH) Interventions    Readmission Risk Interventions No flowsheet data found.

## 2021-06-06 NOTE — Progress Notes (Signed)
Physical Therapy Treatment Patient Details Name: Francisco Padilla MRN: [id number] DOB: [birth year] Today's Date: 06/06/2021   History of Present Illness 70 y.o. male admitted on 06/03/21 for L TKA, WBAT post op.  Pt with significant PMH ofADD, anxiety, A fib, A flutter, DJD, orthostatic hypotension, pre diabetes, gastric bypass, R ulnar nerve transposition.    PT Comments    Continuing work on functional mobility and activity tolerance;  Noting improving smoothness of steps with smoother progression of RW during amb; very motivated to recover well   Recommendations for follow up therapy are one component of a multi-disciplinary discharge planning process, led by the attending physician.  Recommendations may be updated based on patient status, additional functional criteria and insurance authorization.  Follow Up Recommendations  Follow physician's recommendations for discharge plan and follow up therapies     Assistance Recommended at Discharge Set up Supervision/Assistance  Equipment Recommendations  Rolling walker (2 wheels)    Recommendations for Other Services       Precautions / Restrictions Precautions Precautions: Knee Precaution Booklet Issued: Yes (comment) Precaution Comments: Pt educated to not allow any pillow or bolster under knee for healing with optimal range of motion.  Restrictions LLE Weight Bearing: Weight bearing as tolerated     Mobility  Bed Mobility Overal bed mobility: Needs Assistance Bed Mobility: Supine to Sit     Supine to sit: Min assist     General bed mobility comments: Min assist to help progress L leg over the side of the bed.    Transfers Overall transfer level: Needs assistance Equipment used: Rolling walker (2 wheels) Transfers: Sit to/from Stand Sit to Stand: Min assist;From elevated surface           General transfer comment: Min assist from elevated bed to RW, cues for safe hand placement during transitions, better control of  descent to sit down in lower recliner chair.    Ambulation/Gait Ambulation/Gait assistance: Min assist Gait Distance (Feet): 100 Feet Assistive device: Rolling walker (2 wheels) Gait Pattern/deviations: Step-through pattern (emerging) Gait velocity: decreased     General Gait Details: Cues to stand tall on LLE, and focus on pushing heel into the floor during L loading response into stance to activate quad for better knee control   Stairs             Wheelchair Mobility    Modified Rankin (Stroke Patients Only)       Balance                                            Cognition Arousal/Alertness: Awake/alert Behavior During Therapy: WFL for tasks assessed/performed;Anxious Overall Cognitive Status: Within Functional Limits for tasks assessed                                          Exercises Total Joint Exercises Long Arc Quad: AROM;AAROM;Left;10 reps Knee Flexion: AROM;AAROM;10 reps;Seated (including self-AAROM with RLE assisting LLE into further knee flexion)    General Comments        Pertinent Vitals/Pain Pain Assessment: Faces Faces Pain Scale: Hurts even more Pain Location: L knee with ROM therex Pain Descriptors / Indicators: Grimacing;Guarding Pain Intervention(s): Monitored during session    Home Living  Prior Function            PT Goals (current goals can now be found in the care plan section) Acute Rehab PT Goals Patient Stated Goal: to go to post acute rehab for a couple of weeks to get mobile enough to feel safe to be home alone. PT Goal Formulation: With patient Time For Goal Achievement: 06/17/21 Potential to Achieve Goals: Good Progress towards PT goals: Progressing toward goals    Frequency    7X/week      PT Plan Current plan remains appropriate    Co-evaluation              AM-PAC PT "6 Clicks" Mobility   Outcome Measure  Help needed  turning from your back to your side while in a flat bed without using bedrails?: None Help needed moving from lying on your back to sitting on the side of a flat bed without using bedrails?: A Little Help needed moving to and from a bed to a chair (including a wheelchair)?: A Little Help needed standing up from a chair using your arms (e.g., wheelchair or bedside chair)?: A Little Help needed to walk in hospital room?: A Little Help needed climbing 3-5 steps with a railing? : A Lot 6 Click Score: 18    End of Session Equipment Utilized During Treatment: Gait belt Activity Tolerance: Patient tolerated treatment well Patient left: in chair;with call bell/phone within reach;with chair alarm set Nurse Communication: Mobility status PT Visit Diagnosis: Muscle weakness (generalized) (M62.81);Difficulty in walking, not elsewhere classified (R26.2);Pain Pain - Right/Left: Left Pain - part of body: Knee     Time: 1222-1303 PT Time Calculation (min) (ACUTE ONLY): 41 min  Charges:  $Gait Training: 8-22 mins $Therapeutic Exercise: 8-22 mins $Therapeutic Activity: 8-22 mins                     Roney Marion, PT  Acute Rehabilitation Services Pager [phone] Office [phone]    [name] 06/06/2021, 4:33 PM

## 2021-06-07 NOTE — Progress Notes (Signed)
Patient ID: Francisco Padilla, male   DOB: 21-Dec-1950, 70 y.o.   MRN: [id number] Patient is postoperative day 4 total knee arthroplasty he is ambulated down the hall he is done strength training he has not done stair training yet.  Patient wishes to pursue [facility] for rehab in [place].

## 2021-06-07 NOTE — Progress Notes (Signed)
Physical Therapy Treatment Patient Details Name: Francisco Padilla MRN: [id number] DOB: 02-07-1951 Today's Date: 06/07/2021   History of Present Illness 70 y.o. male admitted on 06/03/21 for L TKA, WBAT post op.  Pt with significant PMH ofADD, anxiety, A fib, A flutter, DJD, orthostatic hypotension, pre diabetes, gastric bypass, R ulnar nerve transposition.    PT Comments    Continuing work on functional mobility and activity tolerance;  Very nice progress, with very little L knee buckling in stance; Worked on stair training per pt request, and he managed a flight of steps with one rail on the R (both hands on rail), slow, but steady;   Took time for a conversation re: dc plans; pt recognizes the barriers we are running into re: getting to SNF for post-acute rehab; he has still consistently verbalized that post-acute rehab is his plan, and he would like to exhaust all options for SNF before switching gears and changing the plan to dc home; He reports he is nervous about "forgetting something" if he is alone at home; I emphasized his progress, and expressed confidence in his walking and overall mobility; Discussed with TOC RN as well; Will place OT consult for continuing work on ADLs  Recommendations for follow up therapy are one component of a multi-disciplinary discharge planning process, led by the attending physician.  Recommendations may be updated based on patient status, additional functional criteria and insurance authorization.  Follow Up Recommendations  Follow physician's recommendations for discharge plan and follow up therapies     Assistance Recommended at Discharge Set up Supervision/Assistance  Equipment Recommendations  Rolling walker (2 wheels)    Recommendations for Other Services       Precautions / Restrictions Precautions Precautions: Knee Precaution Booklet Issued: Yes (comment) Precaution Comments:Pt educated to not allow any pillow or bolster under knee for healing  with optimal range of motion.  Restrictions Weight Bearing Restrictions: No LLE Weight Bearing: Weight bearing as tolerated     Mobility  Bed Mobility Overal bed mobility: Needs Assistance Bed Mobility: Supine to Sit     Supine to sit: Min guard     General bed mobility comments: Cues for technique, but no physical assist needed    Transfers Overall transfer level: Needs assistance Equipment used: Rolling walker (2 wheels) Transfers: Sit to/from Stand Sit to Stand: Min assist;From elevated surface           General transfer comment: Min assist to steady RW during power up to stand (tends to push up with R hand from bed adn hold to RW with L hand); Needs cues to step LLE forwad a little before going to sit; otherwise, L knee hurts too much with descent, and he gets a bit "stuck" -- needed assist to reposition with L foot midway on teh way to sitting    Ambulation/Gait Ambulation/Gait assistance: Min guard (with physical contact) Gait Distance (Feet): 100 Feet (x2) Assistive device: Rolling walker (2 wheels) Gait Pattern/deviations: Step-through pattern (emerging)       General Gait Details: Cues to stand tall on LLE, and focus on pushing heel into the floor during L loading response into stance to activate quad for better knee control; no overt knee buckling with amb today   Stairs Stairs: Yes Stairs assistance: Min guard (with physical contact) Stair Management: One rail Right;Step to pattern;Forwards (both hands on rails) Number of Stairs: 12 General stair comments: Step-by-step cues for sequence and options for hand placement; moving slowly, but able to manage the  flight   Wheelchair Mobility    Modified Rankin (Stroke Patients Only)       Balance     Sitting balance-Leahy Scale: Good       Standing balance-Leahy Scale:  (approaching fair)                              Cognition Arousal/Alertness: Awake/alert Behavior During Therapy:  WFL for tasks assessed/performed;Anxious Overall Cognitive Status: Within Functional Limits for tasks assessed                                          Exercises Total Joint Exercises Quad Sets: AROM;Left;10 reps Straight Leg Raises: AROM;10 reps;Left    General Comments General comments (skin integrity, edema, etc.): Reported lightheadedness towards teh end of session, BP in recliner with feet up 104/86, HR 102, MAP 92; this looks improved from earlier session when he was dizzy and BP was 91/58; notified RN and NT, and they will consider getting orthostatic BPs later      Pertinent Vitals/Pain Pain Assessment: Faces Faces Pain Scale: Hurts little more Pain Location: L knee with ROM therex Pain Descriptors / Indicators: Grimacing;Guarding Pain Intervention(s): Monitored during session    Home Living                          Prior Function            PT Goals (current goals can now be found in the care plan section) Acute Rehab PT Goals Patient Stated Goal: to go to post acute rehab for a couple of weeks to get mobile enough to feel safe to be home alone. PT Goal Formulation: With patient Time For Goal Achievement: 06/17/21 Potential to Achieve Goals: Good Progress towards PT goals: Progressing toward goals    Frequency    7X/week      PT Plan Current plan remains appropriate (See discussion in PT comments)    Co-evaluation              AM-PAC PT "6 Clicks" Mobility   Outcome Measure  Help needed turning from your back to your side while in a flat bed without using bedrails?: None Help needed moving from lying on your back to sitting on the side of a flat bed without using bedrails?: None Help needed moving to and from a bed to a chair (including a wheelchair)?: A Little Help needed standing up from a chair using your arms (e.g., wheelchair or bedside chair)?: A Little Help needed to walk in hospital room?: A Little Help needed  climbing 3-5 steps with a railing? : A Little 6 Click Score: 20    End of Session Equipment Utilized During Treatment: Gait belt Activity Tolerance: Patient tolerated treatment well Patient left: in chair;with call bell/phone within reach;with chair alarm set Nurse Communication: Mobility status;Other (comment) (Reports of lightheadedness) PT Visit Diagnosis: Muscle weakness (generalized) (M62.81);Difficulty in walking, not elsewhere classified (R26.2);Pain Pain - Right/Left: Left Pain - part of body: Knee     Time: 2575-0518 PT Time Calculation (min) (ACUTE ONLY): 62 min  Charges:  $Gait Training: 23-37 mins $Therapeutic Exercise: 8-22 mins                     Roney Marion, [place] Pager [phone]  Office [place] 06/07/2021, 11:51 AM

## 2021-06-08 NOTE — Care Management Important Message (Signed)
Important Message  Patient Details  Name: Francisco Padilla MRN: [id number] Date of Birth: 21-Dec-1950   Medicare Important Message Given:        Orbie Pyo 06/08/2021, 3:17 PM

## 2021-06-08 NOTE — Evaluation (Signed)
Occupational Therapy Evaluation Patient Details Name: Francisco Padilla MRN: [id number] DOB: 09/28/1950 Today's Date: 06/08/2021   History of Present Illness 70 y.o. male admitted on 06/03/21 for L TKA, WBAT post op.  Pt with significant PMH ofADD, anxiety, A fib, A flutter, DJD, orthostatic hypotension, pre diabetes, gastric bypass, R ulnar nerve transposition.   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 Pt min guard for seated ADLs, mod-max A for standing ADLs. Requires mod A for bed mobility, unable to assist in bringing LLE to EOB. Pt with mild dizziness upon standing but quickly resided. Min A for transfers with RW. Pt very anxious throughout session requiring mod cuing and reassurance with mobility. Pt presenting with decreased activity tolerance, balance, strength, ROM, and pain at this time, will continue to follow acutely. Pt to benefit from SNF at d/c due to decreased support at home.     Recommendations for follow up therapy are one component of a multi-disciplinary discharge planning process, led by the attending physician.  Recommendations may be updated based on patient status, additional functional criteria and insurance authorization.   Follow Up Recommendations  Skilled nursing-short term rehab (<3 hours/day)    Assistance Recommended at Discharge Intermittent Supervision/Assistance  Functional Status Assessment  Patient has had a recent decline in their functional status and demonstrates the ability to make significant improvements in function in a reasonable and predictable amount of time.  Equipment Recommendations  None recommended by OT    Recommendations for Other Services PT consult     Precautions / Restrictions Precautions Precautions: Knee Restrictions Weight Bearing Restrictions: No LLE Weight Bearing: Weight bearing as tolerated      Mobility Bed Mobility Overal bed mobility: Needs  Assistance Bed Mobility: Supine to Sit     Supine to sit: Mod assist;HOB elevated     General bed mobility comments: cues for technique, needs assistance bringing LLE off of bed    Transfers Overall transfer level: Needs assistance Equipment used: Rolling walker (2 wheels) Transfers: Sit to/from Stand Sit to Stand: Min assist;From elevated surface           General transfer comment: pt reported mild dizziness, reported it has subsided when once standing for a few minutes      Balance Overall balance assessment: Needs assistance Sitting-balance support: No upper extremity supported;Feet supported Sitting balance-Leahy Scale: Good     Standing balance support: Bilateral upper extremity supported Standing balance-Leahy Scale: Poor                             ADL either performed or assessed with clinical judgement   ADL Overall ADL's : Needs assistance/impaired Eating/Feeding: Set up;Sitting   Grooming: Min guard;Standing   Upper Body Bathing: Min guard;Sitting   Lower Body Bathing: Sitting/lateral leans;Maximal assistance   Upper Body Dressing : Set up;Bed level Upper Body Dressing Details (indicate cue type and reason): long sitting in bed Lower Body Dressing: Maximal assistance;Sitting/lateral leans   Toilet Transfer: Minimal assistance;Rolling walker (2 wheels) Toilet Transfer Details (indicate cue type and reason): simulated at chair Toileting- Clothing Manipulation and Hygiene: Moderate assistance;Sit to/from stand   Tub/ Banker: Maximal assistance;+2 for physical assistance;Rolling walker (2 wheels)   Functional mobility during ADLs: Minimal assistance General ADL Comments: very anxious throughout, needing reminders/cuing     Vision   Vision Assessment?: No apparent visual deficits     Perception  Praxis      Pertinent Vitals/Pain Pain Assessment: Faces Pain Score: 3  Faces Pain Scale: Hurts little more Pain Location:  LLE Pain Descriptors / Indicators: Grimacing;Guarding Pain Intervention(s): Limited activity within patient's tolerance;Monitored during session;Premedicated before session;Repositioned     Hand Dominance     Extremity/Trunk Assessment Upper Extremity Assessment Upper Extremity Assessment: Overall WFL for tasks assessed   Lower Extremity Assessment Lower Extremity Assessment: Defer to PT evaluation   Cervical / Trunk Assessment Cervical / Trunk Assessment: Normal   Communication Communication Communication: No difficulties   Cognition Arousal/Alertness: Awake/alert Behavior During Therapy: WFL for tasks assessed/performed;Anxious Overall Cognitive Status: Within Functional Limits for tasks assessed                                       General Comments       Exercises     Shoulder Instructions      Home Living Family/patient expects to be discharged to:: Skilled nursing facility Living Arrangements: Alone                               Additional Comments: wears glasses for reading      Prior Functioning/Environment Prior Level of Function : Independent/Modified Independent                        OT Problem List: Decreased strength;Decreased range of motion;Decreased activity tolerance;Impaired balance (sitting and/or standing);Pain;Decreased coordination      OT Treatment/Interventions: Self-care/ADL training;Therapeutic exercise;Therapeutic activities;Balance training;Patient/family education;DME and/or AE instruction    OT Goals(Current goals can be found in the care plan section) Acute Rehab OT Goals Patient Stated Goal: go to rehab OT Goal Formulation: With patient Time For Goal Achievement: 06/22/21 Potential to Achieve Goals: Good ADL Goals Pt Will Perform Lower Body Dressing: with min assist;sitting/lateral leans;with adaptive equipment Pt Will Transfer to Toilet: with supervision;regular height toilet Pt Will  Perform Toileting - Clothing Manipulation and hygiene: with supervision;sit to/from stand;with adaptive equipment Pt Will Perform Tub/Shower Transfer: ambulating;rolling walker;with min guard assist  OT Frequency: Min 2X/week   Barriers to D/C: Decreased caregiver support  pt requesting postacute rehab       Co-evaluation              AM-PAC OT "6 Clicks" Daily Activity     Outcome Measure Help from another person eating meals?: None Help from another person taking care of personal grooming?: A Little Help from another person toileting, which includes using toliet, bedpan, or urinal?: A Little Help from another person bathing (including washing, rinsing, drying)?: A Lot Help from another person to put on and taking off regular upper body clothing?: None Help from another person to put on and taking off regular lower body clothing?: A Lot 6 Click Score: 18   End of Session Equipment Utilized During Treatment: Gait belt;Rolling walker (2 wheels) Nurse Communication: Mobility status;Other (comment) (notified nurse pt is up in chair)  Activity Tolerance: Patient tolerated treatment well;Patient limited by pain Patient left: in chair;with chair alarm set  OT Visit Diagnosis: Unsteadiness on feet (R26.81);Other abnormalities of gait and mobility (R26.89);Muscle weakness (generalized) (M62.81);Pain                Time: 5003-7048 OT Time Calculation (min): 63 min Charges:  OT General Charges $OT Visit: 1 Visit OT  Evaluation $OT Eval Low Complexity: 1 Low OT Treatments $Self Care/Home Management : 38-52 mins  Lynnda Child, OTD, OTR/L Acute Rehab [phone]) [phone]   Kaylyn Lim 06/08/2021, 10:13 AM

## 2021-06-08 NOTE — Progress Notes (Signed)
Physical Therapy Treatment Patient Details Name: Francisco Padilla MRN: [id number] DOB: [birth year] Today's Date: 06/08/2021   History of Present Illness 70 y.o. male admitted on 06/03/21 for L TKA, WBAT post op.  Pt with significant PMH ofADD, anxiety, A fib, A flutter, DJD, orthostatic hypotension, pre diabetes, gastric bypass, R ulnar nerve transposition.    PT Comments    Continuing work on functional mobility and activity tolerance;  Pt is engaged in session, participating well (even in pain), and clearly wants to recover well; session focused on therex (in addition to amb), and pt worked hard;   While his AMPAC score may seem indicative of aiming at home, it doesn't take into account the extra time and cueing for safety he needs; He demonstrates some carryover of education, but is taking more time and need for reinforcement of cueing; Discussed with OT, and both PT and OT heartily agree with pt's wishes for post-acute rehab for recovery and to maximize independence and safety with mobility and ADLs prior to dc home alone   Recommendations for follow up therapy are one component of a multi-disciplinary discharge planning process, led by the attending physician.  Recommendations may be updated based on patient status, additional functional criteria and insurance authorization.  Follow Up Recommendations  Follow physician's recommendations for discharge plan and follow up therapies     Assistance Recommended at Discharge Set up Supervision/Assistance  Equipment Recommendations  Rolling walker (2 wheels)    Recommendations for Other Services       Precautions / Restrictions Precautions Precautions: Knee Precaution Booklet Issued: Yes (comment) Precaution Comments: Pt educated to not allow any pillow or bolster under knee for healing with optimal range of motion.  Restrictions LLE Weight Bearing: Weight bearing as tolerated     Mobility  Bed Mobility Overal bed mobility: Needs  Assistance Bed Mobility: Supine to Sit     Supine to sit: Min guard     General bed mobility comments: Encouraged pt to problem-solve getting to EOB himself; "since you don't have motion restrictions, there's not really a wrong way to get up"    Transfers Overall transfer level: Needs assistance Equipment used: Rolling walker (2 wheels) Transfers: Sit to/from Stand Sit to Stand: Min guard           General transfer comment: pt reported mild dizziness, reported it has subsided when once standing for a few minutes; Once back inthe room, pt set the RW to the side before squaring off with recliner; cued for safer RW proximity, as well as to pre-position LLE in front for less pain with transition to sit    Ambulation/Gait Ambulation/Gait assistance: Min guard Gait Distance (Feet): 100 Feet Assistive device: Rolling walker (2 wheels) Gait Pattern/deviations: Step-through pattern (emerging) Gait velocity: decreased     General Gait Details: Cues to stand tall on LLE, and focus on pushing heel into the floor during L loading response into stance to activate quad for better knee control; no overt knee buckling with amb today   Stairs             Wheelchair Mobility    Modified Rankin (Stroke Patients Only)       Balance     Sitting balance-Leahy Scale: Good     Standing balance support: Bilateral upper extremity supported Standing balance-Leahy Scale: Poor Standing balance comment: needs support of RW and min guard assist from PT  Cognition Arousal/Alertness: Awake/alert Behavior During Therapy: WFL for tasks assessed/performed;Anxious Overall Cognitive Status: Within Functional Limits for tasks assessed                                          Exercises Total Joint Exercises Ankle Circles/Pumps: AROM;Both;20 reps Quad Sets: AROM;Left;10 reps Short Arc QuadSinclair Ship;Left;10 reps Heel Slides:  AAROM;Left;10 reps Straight Leg Raises: AROM;10 reps;Left Goniometric ROM: approx 1-79 deg    General Comments        Pertinent Vitals/Pain Pain Assessment: Faces Faces Pain Scale: Hurts whole lot Pain Location: L knee with flexion therex Pain Descriptors / Indicators: Grimacing;Guarding Pain Intervention(s): Monitored during session    Home Living                          Prior Function            PT Goals (current goals can now be found in the care plan section) Acute Rehab PT Goals Patient Stated Goal: to go to post acute rehab for a couple of weeks to get mobile enough to feel safe to be home alone. PT Goal Formulation: With patient Time For Goal Achievement: 06/17/21 Potential to Achieve Goals: Good Progress towards PT goals: Progressing toward goals    Frequency    7X/week      PT Plan Current plan remains appropriate    Co-evaluation              AM-PAC PT "6 Clicks" Mobility   Outcome Measure  Help needed turning from your back to your side while in a flat bed without using bedrails?: None Help needed moving from lying on your back to sitting on the side of a flat bed without using bedrails?: None Help needed moving to and from a bed to a chair (including a wheelchair)?: A Little Help needed standing up from a chair using your arms (e.g., wheelchair or bedside chair)?: A Little Help needed to walk in hospital room?: A Little Help needed climbing 3-5 steps with a railing? : A Little 6 Click Score: 20    End of Session Equipment Utilized During Treatment: Gait belt Activity Tolerance: Patient tolerated treatment well Patient left: in chair;with call bell/phone within reach;with chair alarm set Nurse Communication: Mobility status PT Visit Diagnosis: Muscle weakness (generalized) (M62.81);Difficulty in walking, not elsewhere classified (R26.2);Pain Pain - Right/Left: Left Pain - part of body: Knee     Time: 8563-1497 PT Time  Calculation (min) (ACUTE ONLY): 44 min  Charges:  $Gait Training: 8-22 mins $Therapeutic Exercise: 8-22 mins $Therapeutic Activity: 8-22 mins                     Roney Marion, PT  Acute Rehabilitation Services Pager [phone] Office [place] 06/08/2021, 3:59 PM

## 2021-06-09 MED ORDER — HYDROCODONE-ACETAMINOPHEN 5-325 MG PO TABS: 1.0000 | ORAL_TABLET | ORAL | 0 refills | Status: DC | PRN

## 2021-06-09 NOTE — Progress Notes (Signed)
Physical Therapy Treatment Patient Details Name: Francisco Padilla MRN: [id number] DOB: 1951/01/30 Today's Date: 06/09/2021   History of Present Illness 70 y.o. male admitted on 06/03/21 for L TKA, WBAT post op.  Pt with significant PMH ofADD, anxiety, A fib, A flutter, DJD, orthostatic hypotension, pre diabetes, gastric bypass, R ulnar nerve transposition.    PT Comments    Continuing work on functional mobility and activity tolerance, with good progress noted with L knee stability and ROM; Good participation in therex aimed at gravity assisted flexion, and knee extension against gravity;   With gait, noting pt tends to be quite hesitant at first, and quite concerned with sequence and tending to step-to; once out of room and a bit more warmed up, his gait pattern smooths out to more step-through; nice, stable knee in stance, with no knee buckling;   Took time to describe qualifiers for going to Comprehensive Acute Inpatient Rehab, and we discussed medical complexity as the sticking point; Pt asked if his vascular problems, urology problems, and other health issues might factor in -- the answer to this is more dependent on his doctors' opinions; Discussed with Marya Amsler, LCSW  Recommendations for follow up therapy are one component of a multi-disciplinary discharge planning process, led by the attending physician.  Recommendations may be updated based on patient status, additional functional criteria and insurance authorization.  Follow Up Recommendations  Follow physician's recommendations for discharge plan and follow up therapies     Assistance Recommended at Discharge Set up Supervision/Assistance  Equipment Recommendations  Rolling walker (2 wheels)    Recommendations for Other Services       Precautions / Restrictions Precautions Precautions: Knee Precaution Booklet Issued: Yes (comment) Precaution Comments: Pt educated to not allow any pillow or bolster under knee for healing with  optimal range of motion.  Restrictions LLE Weight Bearing: Weight bearing as tolerated     Mobility  Bed Mobility Overal bed mobility: Needs Assistance Bed Mobility: Supine to Sit     Supine to sit: Min guard     General bed mobility comments: Encouraged pt to problem-solve getting to EOB himself; "since you don't have motion restrictions, there's not really a wrong way to get up"    Transfers Overall transfer level: Needs assistance Equipment used: Rolling walker (2 wheels) Transfers: Sit to/from Stand Sit to Stand: Min guard           General transfer comment: Overall good rise, noting dependence on UEs on RW to push up to full stand; pt set the RW to the side before squaring off with recliner; cued for safer RW proximity, as well as to pre-position LLE in front for less pain with transition to sit    Ambulation/Gait Ambulation/Gait assistance: Min guard Gait Distance (Feet): 120 Feet Assistive device: Rolling walker (2 wheels) Gait Pattern/deviations: Step-through pattern (when cued to step-through) Gait velocity: decreased     General Gait Details: Cues to stand tall on LLE, and focus on pushing heel into the floor during L loading response into stance to activate quad for better knee control; no overt knee buckling with amb today   Stairs             Wheelchair Mobility    Modified Rankin (Stroke Patients Only)       Balance     Sitting balance-Leahy Scale: Good     Standing balance support: Bilateral upper extremity supported Standing balance-Leahy Scale: Poor  Cognition Arousal/Alertness: Awake/alert Behavior During Therapy: WFL for tasks assessed/performed;Anxious Overall Cognitive Status: Within Functional Limits for tasks assessed                                          Exercises Total Joint Exercises Long Arc Quad: AROM;AAROM;Left;10 reps Knee Flexion: AROM;AAROM;10  reps;Seated (including self-AAROM with RLE assisting LLE into further knee flexion) Goniometric ROM: approx 0-91 deg    General Comments General comments (skin integrity, edema, etc.): Pt was able to discuss his concerns re: dc planning while performing small tasks like getiing up to EOB, or performing long arc quad LLE; tried to address all/most of his concerns      Pertinent Vitals/Pain Pain Assessment: Faces Faces Pain Scale: Hurts even more Pain Location: L knee with flexion therex Pain Descriptors / Indicators: Grimacing;Guarding Pain Intervention(s): Monitored during session    Home Living                          Prior Function            PT Goals (current goals can now be found in the care plan section) Acute Rehab PT Goals Patient Stated Goal: to go to post acute rehab for a couple of weeks to get mobile enough to feel safe to be home alone. PT Goal Formulation: With patient Time For Goal Achievement: 06/17/21 Potential to Achieve Goals: Good Progress towards PT goals: Progressing toward goals    Frequency    7X/week      PT Plan Current plan remains appropriate    Co-evaluation              AM-PAC PT "6 Clicks" Mobility   Outcome Measure  Help needed turning from your back to your side while in a flat bed without using bedrails?: None Help needed moving from lying on your back to sitting on the side of a flat bed without using bedrails?: None Help needed moving to and from a bed to a chair (including a wheelchair)?: A Little Help needed standing up from a chair using your arms (e.g., wheelchair or bedside chair)?: A Little Help needed to walk in hospital room?: A Little Help needed climbing 3-5 steps with a railing? : A Little 6 Click Score: 20    End of Session Equipment Utilized During Treatment: Gait belt Activity Tolerance: Patient tolerated treatment well Patient left: in chair;with call bell/phone within reach;with chair alarm  set Nurse Communication: Mobility status PT Visit Diagnosis: Muscle weakness (generalized) (M62.81);Difficulty in walking, not elsewhere classified (R26.2);Pain Pain - Right/Left: Left Pain - part of body: Knee     Time: 9326-7124 PT Time Calculation (min) (ACUTE ONLY): 40 min  Charges:  $Gait Training: 8-22 mins $Therapeutic Exercise: 8-22 mins $Therapeutic Activity: 8-22 mins                     Roney Marion, PT  Acute Rehabilitation Services Pager [phone] Office [place] 06/09/2021, 12:39 PM

## 2021-06-09 NOTE — TOC Progression Note (Addendum)
Transition of Care [facility]) - Progression Note    Patient Details  Name: Francisco Padilla MRN: [id number] Date of Birth: June 17, 1951  Transition [facility] Care [facility]) CM/SW Contact  Joanne Chars, LCSW Phone Number: 06/09/2021, 11:15 AM  Clinical Narrative:   CSW spoke with pt again about bed offers.  Pt again very difficult to focus in conversation.  CSW shared that a decision will need to be made soon.  Pt states that he is in excellent shape and needs a facility that will provide 3 hours rehab per day.  CSW again reviewed notes in epic with pt, shared that pt is not being recommended for inpatient rehab where he would get 3 hours per day, pt again with multiple questions as to why this is so.  Holly from PT happened to be nearby and CSW was able to get info from her that pt is not eligible for CIR based on several factors including lack of medical complexity.  CSW shared this information with pt and he would like further details as to why this is so.  (CSW later communicated with Earnest Bailey who will discuss during her PT session.)  Pt called someone at [facility], asking about referral there.  Per TOC handoff, attempt was made to fax referral to [facility] but could not get the fax number.  While in the room, CSW received call from [facility], admissions at [place].  [phone].  She is able tor review a referral, please fax to [phone].  This was done, awaiting response.   1300: [place] can offer a bed, would be able to take pt tomorrow..  CSW spoke with pt who did agree to accept this bed offer.       Expected Discharge Plan: IP Rehab Facility Barriers to Discharge: Continued Medical Work up  Expected Discharge Plan and Services Expected Discharge Plan: [place] In-house Referral: Clinical Social Work Discharge Planning Services: CM Consult Post Acute Care Choice: IP Rehab Living arrangements for the past 2 months: Single Family Home                                        Social Determinants of Health (SDOH) Interventions    Readmission Risk Interventions No flowsheet data found.

## 2021-06-09 NOTE — TOC Progression Note (Signed)
Transition of Care [facility]) - Progression Note    Patient Details  Name: Francisco Padilla MRN: [id number] Date of Birth: [birth year]  Transition of [facility]) CM/SW Contact  Joanne Chars, LCSW Phone Number: 06/09/2021, 11:11 AM  Clinical Narrative:   CSW spoke to pt about bed offers for SNF.  Provided choice document and highlighted the 3 current offers.  Pt asking about referral to [facility] in [place].  CSW read epic note to pt from 11/14 where referral was sent and pt did not meet criteria for inpatient rehab.  Pt had multiple questions about why this was so.  CSW attempted to refocus pt back on current bed offers, pt also saying that he needs facility in [place], not [place].    Expected Discharge Plan: IP Rehab Facility Barriers to Discharge: Continued Medical Work up  Expected Discharge Plan and Services Expected Discharge Plan: [place] In-house Referral: Clinical Social Work Discharge Planning Services: CM Consult Post Acute Care Choice: IP Rehab Living arrangements for the past 2 months: Single Family Home                                       Social Determinants of Health (SDOH) Interventions    Readmission Risk Interventions No flowsheet data found.

## 2021-06-09 NOTE — Discharge Summary (Signed)
Discharge Diagnoses:  Active Problems:   Unilateral primary osteoarthritis, left knee   Total knee replacement status, left   Surgeries: Procedure(s): LEFT TOTAL KNEE ARTHROPLASTY on 06/03/2021    Consultants:   Discharged Condition: Improved  Hospital Course: Francisco Padilla is an 70 y.o. male who was admitted 06/03/2021 with a chief complaint of osteoarthritis left knee, with a final diagnosis of Osteoarthritis Left Knee.  Patient was brought to the operating room on 06/03/2021 and underwent Procedure(s): LEFT TOTAL KNEE ARTHROPLASTY.    Patient was given perioperative antibiotics:  Anti-infectives (From admission, onward)    Start     Dose/Rate Route Frequency Ordered Stop   06/03/21 1700  ceFAZolin (ANCEF) IVPB 2g/100 mL premix        2 g 200 mL/hr over 30 Minutes Intravenous Every 6 hours 06/03/21 1549 06/03/21 2342   06/03/21 0616  ceFAZolin (ANCEF) 2-4 GM/100ML-% IVPB       Note to Pharmacy: Odelia Gage   : cabinet override      06/03/21 0616 06/03/21 0751   06/03/21 0615  ceFAZolin (ANCEF) IVPB 2g/100 mL premix        2 g 200 mL/hr over 30 Minutes Intravenous On call to O.R. 06/03/21 1601 06/03/21 0744     .  Patient was given sequential compression devices, early ambulation, and aspirin for DVT prophylaxis.  Recent vital signs: Patient Vitals for the past 24 hrs:  BP Temp Temp src Pulse Resp SpO2  06/09/21 0739 (!) 138/96 97.9 F (36.6 C) Oral 89 16 100 %  06/09/21 0636 (!) 114/91 97.9 F (36.6 C) -- 84 17 --  06/09/21 0623 (!) 114/91 -- -- 84 17 --  06/09/21 0557 (!) 126/94 97.9 F (36.6 C) Oral (!) 111 16 100 %  06/09/21 0557 (!) 114/91 97.9 F (36.6 C) -- 84 17 --  06/08/21 2107 (!) 127/95 97.9 F (36.6 C) Oral (!) 102 16 100 %  06/08/21 1252 107/76 98 F (36.7 C) -- 98 18 99 %  .  Recent laboratory studies: No results found.  Discharge Medications:   Allergies as of 06/09/2021       Reactions   Aspirin    Avoid due to bariatric surgery    Nsaids    Avoid due to bariatric surgery   Oxycodone Hcl    Hallucinations, agitation         Medication List     TAKE these medications    acetaminophen 650 MG CR tablet Commonly known as: TYLENOL Take 1,300 mg by mouth every 8 (eight) hours as needed for pain.   atorvastatin 20 MG tablet Commonly known as: LIPITOR Take 20 mg by mouth at bedtime.   B-12 SL Place 1 tablet under the tongue daily.   CALCIUM CITRATE + D PO Take 1 tablet by mouth 2 (two) times daily.   diphenhydrAMINE 25 MG tablet Commonly known as: BENADRYL Take 12.5-25 mg by mouth at bedtime.   Eliquis 5 MG Tabs tablet Generic drug: apixaban Take 5 mg by mouth 2 (two) times daily.   fludrocortisone 0.1 MG tablet Commonly known as: FLORINEF Take 0.1 mg by mouth daily.   gabapentin 100 MG capsule Commonly known as: NEURONTIN Take 200 mg by mouth at bedtime.   HYDROcodone-acetaminophen 5-325 MG tablet Commonly known as: NORCO/VICODIN Take 1 tablet by mouth every 4 (four) hours as needed.   latanoprost 0.005 % ophthalmic solution Commonly known as: XALATAN Place 1 drop into both eyes at bedtime.   LORazepam  1 MG tablet Commonly known as: ATIVAN Take 0.5 mg by mouth every 4 (four) hours.   One Daily Multivitamin Men Tabs Take 1 tablet by mouth 2 (two) times daily.   pantoprazole 40 MG tablet Commonly known as: PROTONIX Take 80 mg by mouth at bedtime.   PROBIOTIC PO Take 1 capsule by mouth daily.   sertraline 100 MG tablet Commonly known as: ZOLOFT Take 100 mg by mouth at bedtime.   SUPER B COMPLEX/C PO Take 1 tablet by mouth daily.   SYSTANE OP Place 1 drop into both eyes daily as needed (dry eyes).   zolpidem 10 MG tablet Commonly known as: AMBIEN Take 5-10 mg by mouth at bedtime.        Diagnostic Studies: CT PELVIS W CONTRAST  Result Date: 06/06/2021 CLINICAL DATA:  Inpatient. Indeterminate left seminal vesicle region soft tissue mass on recent unenhanced CT. EXAM: CT  PELVIS WITH CONTRAST TECHNIQUE: Multidetector CT imaging of the pelvis was performed using the standard protocol following the bolus administration of intravenous contrast. CONTRAST:  165mL OMNIPAQUE IOHEXOL 300 MG/ML  SOLN COMPARISON:  05/25/2021 unenhanced CT abdomen/pelvis. FINDINGS: Urinary Tract: Normal bladder. Normal caliber ureters. Exophytic homogeneous 4.7 cm hypodense anterior interpolar left renal cortical lesion (series 3/image 1), not substantially changed since 01/26/2011 CT abdomen study, where it was characterized as a benign complex cyst. Nonobstructing stones in the visualized lower kidneys bilaterally, largest 6 mm in the lower left kidney. Previously visualized mild-to-moderate left hydronephrosis has improved and is now minimal. No UPJ or ureteral stones on today's scan. Bowel: Moderate sigmoid diverticulosis. Moderate diffuse colorectal stool. No dilated pelvic small bowel loops. No bowel wall thickening. Vascular/Lymphatic: Atherosclerotic nonaneurysmal visualized abdominal aorta. Mildly dilated 2.2 cm diameter left common iliac artery. No pathologically enlarged pelvic lymph nodes. Reproductive: Top-normal size prostate. Mildly asymmetrically enlarged homogeneous left seminal vesicle, which measures 3.5 x 1.9 cm (series 3/image 38), significantly decreased from 5.5 x 3.1 cm on 05/25/2021 unenhanced CT abdomen/pelvis study using similar measurement technique. The right seminal vesicle is asymmetrically atrophic, unchanged. Other: No pelvic ascites or pneumoperitoneum. No focal fluid collections. Musculoskeletal: No aggressive appearing focal osseous lesions. Marked degenerative disc disease in the visualized lower lumbar spine, most prominent at L3-4. IMPRESSION: 1. Mildly asymmetrically enlarged homogeneous left seminal vesicle, significantly decreased in size since 05/25/2021 unenhanced CT abdomen/pelvis study. Findings are currently within normal limits. No discrete mass or fluid  collection. 2. No pelvic lymphadenopathy. 3. Nonobstructing bilateral nephrolithiasis. Previously visualized left hydroureteronephrosis has improved since 05/25/2021 CT, with residual minimal left hydronephrosis in the visualized left kidney. No residual UPJ or ureteral stones on today's pelvic CT study. 4. Moderate sigmoid diverticulosis. Moderate colorectal stool volume, suggesting constipation. 5. Aortic Atherosclerosis (ICD10-I70.0). Electronically Signed   By: Ilona Sorrel M.D.   On: 06/06/2021 12:22   VAS [place] LOWER EXTREMITY VENOUS REFLUX  Result Date: 06/01/2021  Lower Venous Reflux Study Patient Name:  Francisco Padilla  Date of Exam:   06/01/2021 Medical Rec #: [id number]      Accession #:    [id number] Date of Birth: 1951/06/18      Patient Gender: M Patient Age:   74 years Exam Location:  [facility] Procedure:      VAS [place] LOWER EXTREMITY VENOUS REFLUX Referring Phys: Servando Snare --------------------------------------------------------------------------------  Indications: Varicosities, and Swelling.  Risk Factors: Previous bilateral great saphenous vein ablation: Right 11/25/2008, Left 11/11/2008. Comparison Study: 07/01/2019: Lt- No evidence of deep vein thrombosis. Common  femoral vein reflux. GSV reflux from the SFJ into the calf. No                   evidence of SSV reflux. Performing Technologist: Ivan Croft  Examination Guidelines: A complete evaluation includes B-mode imaging, spectral Doppler, color Doppler, and power Doppler as needed of all accessible portions of each vessel. Bilateral testing is considered an integral part of a complete examination. Limited examinations for reoccurring indications may be performed as noted. The reflux portion of the exam is performed with the patient in reverse Trendelenburg. Significant venous reflux is defined as >500 ms in the superficial venous system, and >1 second in the deep venous system.   +--------------+---------+------+-----------+------------+---------------------+ LEFT          Reflux NoRefluxReflux TimeDiameter cmsComments                                      Yes                                               +--------------+---------+------+-----------+------------+---------------------+ CFV                     yes   >1 second                                   +--------------+---------+------+-----------+------------+---------------------+ FV mid                  yes   >1 second                                   +--------------+---------+------+-----------+------------+---------------------+ Popliteal     no                                                          +--------------+---------+------+-----------+------------+---------------------+ GSV at SFJ              yes    >500 ms      1.15                          +--------------+---------+------+-----------+------------+---------------------+ GSV prox thigh          yes    >500 ms      0.75                          +--------------+---------+------+-----------+------------+---------------------+ GSV mid thigh           yes    >500 ms      1.46    small area of chronic                                                     thrombus              +--------------+---------+------+-----------+------------+---------------------+  GSV dist thigh          yes    >500 ms      1.11    small area of chronic                                                     thrombus              +--------------+---------+------+-----------+------------+---------------------+ GSV at knee             yes    >500 ms      0.70                          +--------------+---------+------+-----------+------------+---------------------+ GSV prox calf           yes    >500 ms      0.74                          +--------------+---------+------+-----------+------------+---------------------+ SSV  Pop Fossa no                            0.52                          +--------------+---------+------+-----------+------------+---------------------+ SSV prox calf           yes    >500 ms      0.39                          +--------------+---------+------+-----------+------------+---------------------+ SSV mid calf  no                            0.39                          +--------------+---------+------+-----------+------------+---------------------+   Summary: Left: - No evidence of deep vein thrombosis seen in the left lower extremity, from the common femoral through the popliteal veins.  - Venous reflux is noted in the left common femoral vein. - Venous reflux is noted in the left sapheno-femoral junction. - Venous reflux is noted in the left greater saphenous vein in the thigh. - Venous reflux is noted in the left greater saphenous vein in the calf. - Venous reflux is noted in the left femoral vein. - Venous reflux is noted in the mid left short saphenous vein.  *See table(s) above for measurements and observations. Electronically signed by Servando Snare MD on 06/01/2021 at 3:59:56 PM.    Final     Patient benefited maximally from their hospital stay and there were no complications.     Disposition: Discharge disposition: 03-Skilled Nursing Facility      Discharge Instructions     Call MD / Call 911   Complete by: As directed    If you experience chest pain or shortness of breath, CALL 911 and be transported to the hospital emergency room.  If you develope a fever above 101 F, pus (white drainage) or increased drainage or redness at the wound, or calf pain, call your surgeon's office.   Constipation Prevention   Complete by: As directed  Drink plenty of fluids.  Prune juice may be helpful.  You may use a stool softener, such as Colace (over the counter) 100 mg twice a day.  Use MiraLax (over the counter) for constipation as needed.   Diet - low sodium heart healthy    Complete by: As directed    Increase activity slowly as tolerated   Complete by: As directed    Post-operative opioid taper instructions:   Complete by: As directed    POST-OPERATIVE OPIOID TAPER INSTRUCTIONS: It is important to wean off of your opioid medication as soon as possible. If you do not need pain medication after your surgery it is ok to stop day one. Opioids include: Codeine, Hydrocodone(Norco, Vicodin), Oxycodone(Percocet, oxycontin) and hydromorphone amongst others.  Long term and even short term use of opiods can cause: Increased pain response Dependence Constipation Depression Respiratory depression And more.  Withdrawal symptoms can include Flu like symptoms Nausea, vomiting And more Techniques to manage these symptoms Hydrate well Eat regular healthy meals Stay active Use relaxation techniques(deep breathing, meditating, yoga) Do Not substitute Alcohol to help with tapering If you have been on opioids for less than two weeks and do not have pain than it is ok to stop all together.  Plan to wean off of opioids This plan should start within one week post op of your joint replacement. Maintain the same interval or time between taking each dose and first decrease the dose.  Cut the total daily intake of opioids by one tablet each day Next start to increase the time between doses. The last dose that should be eliminated is the evening dose.          Follow-up Information     Newt Minion, MD Follow up in 2 week(s).   Specialty: Orthopedic Surgery Contact information: [address] [place] [address] [phone]                  Signed: Newt Minion 06/09/2021, 12:28 PM

## 2021-06-09 NOTE — Care Management Important Message (Signed)
Important Message  Patient Details  Name: Francisco Padilla MRN: [id number] Date of Birth: Dec 03, 1950   Medicare Important Message Given:  Yes     Joetta Manners 06/09/2021, 2:24 PM

## 2021-06-09 NOTE — Care Management Important Message (Signed)
Important Message  Patient Details  Name: Francisco Padilla MRN: [id number] Date of Birth: [birth year]   Medicare Important Message Given:  Yes     Joetta Manners 06/09/2021, 2:14 PM

## 2021-06-10 DIAGNOSIS — Z96652 Presence of left artificial knee joint: Secondary | ICD-10-CM | POA: Diagnosis not present

## 2021-06-10 DIAGNOSIS — F900 Attention-deficit hyperactivity disorder, predominantly inattentive type: Secondary | ICD-10-CM | POA: Diagnosis not present

## 2021-06-10 DIAGNOSIS — F5101 Primary insomnia: Secondary | ICD-10-CM | POA: Diagnosis not present

## 2021-06-10 DIAGNOSIS — Z471 Aftercare following joint replacement surgery: Secondary | ICD-10-CM | POA: Diagnosis not present

## 2021-06-10 DIAGNOSIS — G629 Polyneuropathy, unspecified: Secondary | ICD-10-CM | POA: Diagnosis not present

## 2021-06-10 DIAGNOSIS — G4733 Obstructive sleep apnea (adult) (pediatric): Secondary | ICD-10-CM | POA: Diagnosis not present

## 2021-06-10 DIAGNOSIS — I4811 Longstanding persistent atrial fibrillation: Secondary | ICD-10-CM | POA: Diagnosis not present

## 2021-06-10 DIAGNOSIS — Z9884 Bariatric surgery status: Secondary | ICD-10-CM | POA: Diagnosis not present

## 2021-06-10 DIAGNOSIS — I4892 Unspecified atrial flutter: Secondary | ICD-10-CM | POA: Diagnosis not present

## 2021-06-10 DIAGNOSIS — I959 Hypotension, unspecified: Secondary | ICD-10-CM | POA: Diagnosis not present

## 2021-06-10 DIAGNOSIS — H40009 Preglaucoma, unspecified, unspecified eye: Secondary | ICD-10-CM | POA: Diagnosis not present

## 2021-06-10 DIAGNOSIS — M1990 Unspecified osteoarthritis, unspecified site: Secondary | ICD-10-CM | POA: Diagnosis not present

## 2021-06-10 DIAGNOSIS — Z7901 Long term (current) use of anticoagulants: Secondary | ICD-10-CM | POA: Diagnosis not present

## 2021-06-10 DIAGNOSIS — I350 Nonrheumatic aortic (valve) stenosis: Secondary | ICD-10-CM | POA: Diagnosis not present

## 2021-06-10 DIAGNOSIS — I4891 Unspecified atrial fibrillation: Secondary | ICD-10-CM | POA: Diagnosis not present

## 2021-06-10 DIAGNOSIS — E785 Hyperlipidemia, unspecified: Secondary | ICD-10-CM | POA: Diagnosis not present

## 2021-06-10 DIAGNOSIS — J45909 Unspecified asthma, uncomplicated: Secondary | ICD-10-CM | POA: Diagnosis not present

## 2021-06-10 DIAGNOSIS — F32A Depression, unspecified: Secondary | ICD-10-CM | POA: Diagnosis not present

## 2021-06-10 DIAGNOSIS — F419 Anxiety disorder, unspecified: Secondary | ICD-10-CM | POA: Diagnosis not present

## 2021-06-10 DIAGNOSIS — M109 Gout, unspecified: Secondary | ICD-10-CM | POA: Diagnosis not present

## 2021-06-10 DIAGNOSIS — I4819 Other persistent atrial fibrillation: Secondary | ICD-10-CM | POA: Diagnosis not present

## 2021-06-10 DIAGNOSIS — K219 Gastro-esophageal reflux disease without esophagitis: Secondary | ICD-10-CM | POA: Diagnosis not present

## 2021-06-10 DIAGNOSIS — F418 Other specified anxiety disorders: Secondary | ICD-10-CM | POA: Diagnosis not present

## 2021-06-10 DIAGNOSIS — R7303 Prediabetes: Secondary | ICD-10-CM | POA: Diagnosis not present

## 2021-06-10 DIAGNOSIS — I2510 Atherosclerotic heart disease of native coronary artery without angina pectoris: Secondary | ICD-10-CM | POA: Diagnosis not present

## 2021-06-10 NOTE — TOC Progression Note (Signed)
Transition of Care [facility]) - Initial/Assessment Note    Patient Details  Name: Francisco Padilla MRN: [id number] Date of Birth: Jun 02, 1951  Transition of [facility]) CM/SW Contact:    Milinda Antis, LCSWA Phone Number: 06/10/2021, 8:48 AM  Clinical Narrative:                 CSW faxed discharge summary to [facility].  Expected Discharge Plan: IP Rehab Facility Barriers to Discharge: Continued Medical Work up   Patient Goals and CMS Choice Patient states their goals for this hospitalization and ongoing recovery are:: rehab for a couple weeks then return home CMS Medicare.gov Compare Post Acute Care list provided to:: Patient Choice offered to / list presented to : Patient  Expected Discharge Plan and Services Expected Discharge Plan: [place] In-house Referral: Clinical Social Work Discharge Planning Services: CM Consult Post Acute Care Choice: IP Rehab Living arrangements for the past 2 months: Single Family Home Expected Discharge Date: 06/10/21                                    Prior Living Arrangements/Services Living arrangements for the past 2 months: Single Family Home Lives with:: Self Patient language and need for interpreter reviewed:: Yes        Need for Family Participation in Patient Care: Yes (Comment) Care giver support system in place?: No (comment)   Criminal Activity/Legal Involvement Pertinent to Current Situation/Hospitalization: No - Comment as needed  Activities of Daily Living Home Assistive Devices/Equipment: (P) Grab bars in shower ADL Screening (condition at time of admission) Patient's cognitive ability adequate to safely complete daily activities?: (P) Yes Is the patient deaf or have difficulty hearing?: (P) No Does the patient have difficulty seeing, even when wearing glasses/contacts?: (P) No Does the patient have difficulty concentrating, remembering, or making decisions?: (P) Yes Patient able to express need for  assistance with ADLs?: (P) Yes Does the patient have difficulty dressing or bathing?: (P) No Independently performs ADLs?: (P) Yes (appropriate for developmental age) Does the patient have difficulty walking or climbing stairs?: (P) Yes Weakness of Legs: (P) Both Weakness of Arms/Hands: (P) None  Permission Sought/Granted                  Emotional Assessment Appearance:: Appears stated age Attitude/Demeanor/Rapport: Engaged Affect (typically observed): Accepting Orientation: : Oriented to Self, Oriented to Place, Oriented to  Time, Oriented to Situation Alcohol / Substance Use: Not Applicable Psych Involvement: No (comment)  Admission diagnosis:  Total knee replacement status, left [Z96.652] Patient Active Problem List   Diagnosis Date Noted   Total knee replacement status, 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   Unilateral primary osteoarthritis, left knee    Longstanding persistent atrial fibrillation ([place]) 04/26/2021   Preop cardiovascular exam 04/26/2021   Orthostatic hypotension 02/20/2019   Chronic anticoagulation 03/05/2015   Abnormal chest x-ray with multiple lung nodules 12/14/2014   AS (aortic stenosis) 12/14/2014   Atrial flutter by electrocardiogram ([place]) 12/14/2014   CAD (coronary artery disease) 12/14/2014   DJD (degenerative joint disease) 12/14/2014   History of GI bleed 12/14/2014   Nephrolithiasis 12/14/2014   Other activity(E029.9) 12/14/2014   Prediabetes 12/14/2014   Thrombocytopenia ([place]) 12/14/2014   Arterial blood pressure decreased 10/28/2014   Atrial fibrillation ([place]) 09/02/2014   Morbid obesity ([place]) 09/02/2014   AF (paroxysmal atrial fibrillation) ([place]) 08/03/2014   Gastroduodenal ulcer 08/03/2014   Apnea, sleep 08/03/2014  Glaucoma suspect 02/21/2013   Glaucoma suspect of both eyes 02/21/2013   Herpes 04/11/2012   Routine general medical examination at a health care facility 04/11/2012   DYSLIPIDEMIA 05/31/2009   DEPRESSION/ANXIETY 05/31/2009    ATTENTION DEFICIT DISORDER 05/31/2009   Obstructive sleep apnea 05/31/2009   GERD 05/31/2009   GOUT, HX OF 05/31/2009   PCP:  Lajean Manes, MD Pharmacy:   [id number], [place] [address] Phone: [phone] Fax: [phone]  [id number] [place], [place] - [address] [place] [address] Phone: [phone] Fax: [phone]     Social Determinants of Health (SDOH) Interventions    Readmission Risk Interventions No flowsheet data found.

## 2021-06-10 NOTE — TOC Transition Note (Signed)
Transition of Care [facility]) - CM/SW Discharge Note   Patient Details  Name: IMANOL BIHL MRN: [id number] Date of Birth: 10/23/1950  [facility]) CM/SW Contact:  Milinda Antis, [place] Phone Number: 06/10/2021, 11:40 AM   Clinical Narrative:    Patient will DC to: [facility] Anticipated DC date: 06/10/2021 Transport by: Corey Harold   Per MD patient ready for DC to SNF. RN to call report prior to discharge [phone] Room 8136. RN, patient, and facility notified of DC. Discharge Summary and FL2 sent to facility. DC packet on chart. Ambulance transport requested for patient.   CSW will sign off for now as social work intervention is no longer needed. Please consult [place] again if new needs arise.       Barriers to Discharge: Continued Medical Work up   Patient Goals and CMS Choice Patient states their goals for this hospitalization and ongoing recovery are:: rehab for a couple weeks then return home CMS Medicare.gov Compare Post Acute Care list provided to:: Patient Choice offered to / list presented to : Patient  Discharge Placement                       Discharge Plan and Services In-house Referral: Clinical Social Work Discharge Planning Services: CM Consult Post Acute Care Choice: IP Rehab                               Social Determinants of Health (SDOH) Interventions     Readmission Risk Interventions No flowsheet data found.

## 2021-06-10 NOTE — Plan of Care (Deleted)
  Problem: Education: Goal: Knowledge of General Education information will improve Description: Including pain rating scale, medication(s)/side effects and non-pharmacologic comfort measures 06/10/2021 1314 by Cherisa Brucker L, LPN Outcome: Adequate for Discharge 06/10/2021 1313 by Eustace Quail, LPN Outcome: Adequate for Discharge   Problem: Health Behavior/Discharge Planning: Goal: Ability to manage health-related needs will improve 06/10/2021 1314 by Larwence Tu L, LPN Outcome: Adequate for Discharge 06/10/2021 1313 by Eustace Quail, LPN Outcome: Adequate for Discharge   Problem: Clinical Measurements: Goal: Ability to maintain clinical measurements within normal limits will improve 06/10/2021 1314 by Nastassja Witkop L, LPN Outcome: Adequate for Discharge 06/10/2021 1313 by Oris Drone L, LPN Outcome: Adequate for Discharge Goal: Will remain free from infection 06/10/2021 1314 by Lilyona Richner L, LPN Outcome: Adequate for Discharge 06/10/2021 1313 by Oris Drone L, LPN Outcome: Adequate for Discharge Goal: Diagnostic test results will improve 06/10/2021 1314 by Ellaina Schuler L, LPN Outcome: Adequate for Discharge 06/10/2021 1313 by Oris Drone L, LPN Outcome: Adequate for Discharge Goal: Respiratory complications will improve 06/10/2021 1314 by Oris Drone L, LPN Outcome: Adequate for Discharge 06/10/2021 1313 by Eustace Quail, LPN Outcome: Adequate for Discharge Goal: Cardiovascular complication will be avoided 06/10/2021 1314 by Tai Skelly L, LPN Outcome: Adequate for Discharge 06/10/2021 1313 by Cleburne Savini, Lawanna Kobus, LPN Outcome: Adequate for Discharge   Problem: Activity: Goal: Risk for activity intolerance will decrease 06/10/2021 1314 by Tymothy Cass L, LPN Outcome: Adequate for Discharge 06/10/2021 1313 by Oris Drone L, LPN Outcome: Adequate for Discharge   Problem: Nutrition: Goal: Adequate  nutrition will be maintained 06/10/2021 1314 by Oris Drone L, LPN Outcome: Adequate for Discharge 06/10/2021 1313 by Eustace Quail, LPN Outcome: Adequate for Discharge   Problem: Coping: Goal: Level of anxiety will decrease 06/10/2021 1314 by Katalin Colledge L, LPN Outcome: Adequate for Discharge 06/10/2021 1313 by Eustace Quail, LPN Outcome: Adequate for Discharge   Problem: Elimination: Goal: Will not experience complications related to bowel motility 06/10/2021 1314 by Oris Drone L, LPN Outcome: Adequate for Discharge 06/10/2021 1313 by Eustace Quail, LPN Outcome: Adequate for Discharge Goal: Will not experience complications related to urinary retention 06/10/2021 1314 by Darsh Vandevoort L, LPN Outcome: Adequate for Discharge 06/10/2021 1313 by Oris Drone L, LPN Outcome: Adequate for Discharge   Problem: Pain Managment: Goal: General experience of comfort will improve 06/10/2021 1314 by Chamille Werntz L, LPN Outcome: Adequate for Discharge 06/10/2021 1313 by Eustace Quail, LPN Outcome: Adequate for Discharge   Problem: Safety: Goal: Ability to remain free from injury will improve 06/10/2021 1314 by Clint Biello L, LPN Outcome: Adequate for Discharge 06/10/2021 1313 by Eustace Quail, LPN Outcome: Adequate for Discharge   Problem: Skin Integrity: Goal: Risk for impaired skin integrity will decrease 06/10/2021 1314 by Epifania Littrell L, LPN Outcome: Adequate for Discharge 06/10/2021 1313 by Nafeesah Lapaglia, Lawanna Kobus, LPN Outcome: Adequate for Discharge

## 2021-06-10 NOTE — Plan of Care (Deleted)
  Problem: Education: Goal: Knowledge of General Education information will improve Description: Including pain rating scale, medication(s)/side effects and non-pharmacologic comfort measures 06/10/2021 1314 by Jennifr Gaeta L, LPN Outcome: Adequate for Discharge 06/10/2021 1313 by Eustace Quail, LPN Outcome: Adequate for Discharge   Problem: Health Behavior/Discharge Planning: Goal: Ability to manage health-related needs will improve 06/10/2021 1314 by Clevester Helzer L, LPN Outcome: Adequate for Discharge 06/10/2021 1313 by Eustace Quail, LPN Outcome: Adequate for Discharge   Problem: Clinical Measurements: Goal: Ability to maintain clinical measurements within normal limits will improve 06/10/2021 1314 by Taziah Difatta L, LPN Outcome: Adequate for Discharge 06/10/2021 1313 by Oris Drone L, LPN Outcome: Adequate for Discharge Goal: Will remain free from infection 06/10/2021 1314 by Arriona Prest L, LPN Outcome: Adequate for Discharge 06/10/2021 1313 by Oris Drone L, LPN Outcome: Adequate for Discharge Goal: Diagnostic test results will improve 06/10/2021 1314 by Marletta Bousquet L, LPN Outcome: Adequate for Discharge 06/10/2021 1313 by Oris Drone L, LPN Outcome: Adequate for Discharge Goal: Respiratory complications will improve 06/10/2021 1314 by Oris Drone L, LPN Outcome: Adequate for Discharge 06/10/2021 1313 by Eustace Quail, LPN Outcome: Adequate for Discharge Goal: Cardiovascular complication will be avoided 06/10/2021 1314 by Shirin Echeverry L, LPN Outcome: Adequate for Discharge 06/10/2021 1313 by Alta Goding, Lawanna Kobus, LPN Outcome: Adequate for Discharge   Problem: Activity: Goal: Risk for activity intolerance will decrease 06/10/2021 1314 by Lashunta Frieden L, LPN Outcome: Adequate for Discharge 06/10/2021 1313 by Oris Drone L, LPN Outcome: Adequate for Discharge   Problem: Nutrition: Goal: Adequate  nutrition will be maintained 06/10/2021 1314 by Oris Drone L, LPN Outcome: Adequate for Discharge 06/10/2021 1313 by Eustace Quail, LPN Outcome: Adequate for Discharge   Problem: Coping: Goal: Level of anxiety will decrease 06/10/2021 1314 by Brylin Stanislawski L, LPN Outcome: Adequate for Discharge 06/10/2021 1313 by Eustace Quail, LPN Outcome: Adequate for Discharge   Problem: Elimination: Goal: Will not experience complications related to bowel motility 06/10/2021 1314 by Oris Drone L, LPN Outcome: Adequate for Discharge 06/10/2021 1313 by Eustace Quail, LPN Outcome: Adequate for Discharge Goal: Will not experience complications related to urinary retention 06/10/2021 1314 by Betsie Peckman L, LPN Outcome: Adequate for Discharge 06/10/2021 1313 by Oris Drone L, LPN Outcome: Adequate for Discharge   Problem: Pain Managment: Goal: General experience of comfort will improve 06/10/2021 1314 by Gao Mitnick L, LPN Outcome: Adequate for Discharge 06/10/2021 1313 by Eustace Quail, LPN Outcome: Adequate for Discharge   Problem: Safety: Goal: Ability to remain free from injury will improve 06/10/2021 1314 by Harrison Zetina L, LPN Outcome: Adequate for Discharge 06/10/2021 1313 by Eustace Quail, LPN Outcome: Adequate for Discharge   Problem: Skin Integrity: Goal: Risk for impaired skin integrity will decrease 06/10/2021 1314 by Amiree No L, LPN Outcome: Adequate for Discharge 06/10/2021 1313 by Ashliegh Parekh, Lawanna Kobus, LPN Outcome: Adequate for Discharge

## 2021-06-10 NOTE — Progress Notes (Signed)
PT Cancellation Note  Patient Details Name: Francisco Padilla MRN: [id number] DOB: 03/25/1951   Cancelled Treatment:    Reason Eval/Treat Not Completed: Patient declined, no reason specified (pt declined mobility at this time stating he has to get affairs in order for D/C)   [name] 06/10/2021, 7:24 AM [name], PT Acute Rehabilitation Services Pager: [phone] Office: [phone]

## 2021-06-10 NOTE — Progress Notes (Signed)
PT Cancellation Note  Patient Details Name: Francisco Padilla MRN: [id number] DOB: May 29, 1951   Cancelled Treatment:    Reason Eval/Treat Not Completed: Other (comment) (NT sought out this therapist as pt requesting return however, on arrival pt eating breakfast)   Francisco Padilla B Francisco Padilla 06/10/2021, 9:46 AM Francisco Padilla, PT Acute Rehabilitation Services Pager: [phone] Office: [phone]

## 2021-06-10 NOTE — Plan of Care (Signed)

## 2021-06-10 NOTE — Progress Notes (Signed)
Physical Therapy Treatment Patient Details Name: Francisco Padilla MRN: [id number] DOB: 1951/02/25 Today's Date: 06/10/2021   History of Present Illness 70 y.o. male admitted on 06/03/21 for L TKA, WBAT post op.  Pt with significant PMH ofADD, anxiety, A fib, A flutter, DJD, orthostatic hypotension, pre diabetes, gastric bypass, R ulnar nerve transposition.    PT Comments    Pt continues to progress with mobility and gait well with need for cues as pt easily distracted from task. Pt lives alone and continues to feel unsafe with mobility without assist. Pt educated for knee ROM, precautions, HEP and transfer progression. Encouraged OOB for meals and walking with staff.     Recommendations for follow up therapy are one component of a multi-disciplinary discharge planning process, led by the attending physician.  Recommendations may be updated based on patient status, additional functional criteria and insurance authorization.  Follow Up Recommendations  Follow physician's recommendations for discharge plan and follow up therapies     Assistance Recommended at Discharge Set up Supervision/Assistance  Equipment Recommendations  Rolling walker (2 wheels)    Recommendations for Other Services       Precautions / Restrictions Precautions Precautions: Knee Restrictions LLE Weight Bearing: Weight bearing as tolerated     Mobility  Bed Mobility Overal bed mobility: Modified Independent                  Transfers Overall transfer level: Needs assistance   Transfers: Sit to/from Stand Sit to Stand: Supervision           General transfer comment: cues for hand placement as well as working toward planted foot for increased squat with transitions rather than extending knee. Pt able to maintain knee flexion with extension only after completing transfer    Ambulation/Gait Ambulation/Gait assistance: Min guard Gait Distance (Feet): 240 Feet Assistive device: Rolling walker (2  wheels) Gait Pattern/deviations: Step-through pattern;Decreased stride length   Gait velocity interpretation: 1.31 - 2.62 ft/sec, indicative of limited community ambulator   General Gait Details: initial step to pattern for first 15 feet with cues for heel strike, terminal knee extension on left and swing through. Pt able to complete swing through with cues for looking up throughout remainder of gait   Stairs             Wheelchair Mobility    Modified Rankin (Stroke Patients Only)       Balance Overall balance assessment: Needs assistance   Sitting balance-Leahy Scale: Good Sitting balance - Comments: EOB without support   Standing balance support: Bilateral upper extremity supported Standing balance-Leahy Scale: Poor Standing balance comment: RW for standing and gait                            Cognition Arousal/Alertness: Awake/alert Behavior During Therapy: WFL for tasks assessed/performed Overall Cognitive Status: Within Functional Limits for tasks assessed                                          Exercises Total Joint Exercises Straight Leg Raises: AROM;10 reps;Left;Supine Long Arc Quad: AROM;Left;Seated;15 reps Knee Flexion: AROM;Left;Seated;15 reps Goniometric ROM: 4-90 Standing Hip Extension: AROM;Left;Standing;5 reps    General Comments        Pertinent Vitals/Pain Pain Score: 7  Pain Location: L knee with flexion Pain Descriptors / Indicators: Aching Pain Intervention(s): Limited  activity within patient's tolerance;Monitored during session;Premedicated before session;Repositioned    Home Living                          Prior Function            PT Goals (current goals can now be found in the care plan section) Progress towards PT goals: Progressing toward goals    Frequency    7X/week      PT Plan Current plan remains appropriate    Co-evaluation              AM-PAC PT "6 Clicks"  Mobility   Outcome Measure  Help needed turning from your back to your side while in a flat bed without using bedrails?: None Help needed moving from lying on your back to sitting on the side of a flat bed without using bedrails?: None Help needed moving to and from a bed to a chair (including a wheelchair)?: A Little Help needed standing up from a chair using your arms (e.g., wheelchair or bedside chair)?: A Little Help needed to walk in hospital room?: A Little Help needed climbing 3-5 steps with a railing? : A Little 6 Click Score: 20    End of Session   Activity Tolerance: Patient tolerated treatment well Patient left: in chair;with call bell/phone within reach Nurse Communication: Mobility status PT Visit Diagnosis: Muscle weakness (generalized) (M62.81);Difficulty in walking, not elsewhere classified (R26.2);Pain Pain - Right/Left: Left Pain - part of body: Knee     Time: 1007-1036 PT Time Calculation (min) (ACUTE ONLY): 29 min  Charges:  $Gait Training: 8-22 mins $Therapeutic Exercise: 8-22 mins                     Virdell Hoiland P, PT Acute Rehabilitation Services Pager: [phone] Office: [place] 06/10/2021, 10:48 AM

## 2021-06-10 NOTE — Discharge Summary (Signed)
Discharge Diagnoses:  Active Problems:   Unilateral primary osteoarthritis, left knee   Total knee replacement status, left   Surgeries: Procedure(s): LEFT TOTAL KNEE ARTHROPLASTY on 06/03/2021    Consultants:   Discharged Condition: Improved  Hospital Course: Francisco Padilla is an 70 y.o. male who was admitted 06/03/2021 with a chief complaint of osteoarthritis left knee, with a final diagnosis of Osteoarthritis Left Knee.  Patient was brought to the operating room on 06/03/2021 and underwent Procedure(s): LEFT TOTAL KNEE ARTHROPLASTY.    Patient was given perioperative antibiotics:  Anti-infectives (From admission, onward)    Start     Dose/Rate Route Frequency Ordered Stop   06/03/21 1700  ceFAZolin (ANCEF) IVPB 2g/100 mL premix        2 g 200 mL/hr over 30 Minutes Intravenous Every 6 hours 06/03/21 1549 06/03/21 2342   06/03/21 0616  ceFAZolin (ANCEF) 2-4 GM/100ML-% IVPB       Note to Pharmacy: Odelia Gage   : cabinet override      06/03/21 0616 06/03/21 0751   06/03/21 0615  ceFAZolin (ANCEF) IVPB 2g/100 mL premix        2 g 200 mL/hr over 30 Minutes Intravenous On call to O.R. 06/03/21 7494 06/03/21 0744     .  Patient was given sequential compression devices, early ambulation, and aspirin for DVT prophylaxis.  Recent vital signs: Patient Vitals for the past 24 hrs:  BP Temp Temp src Pulse Resp SpO2  06/10/21 0511 121/89 98.3 F (36.8 C) Oral 96 17 98 %  06/09/21 2134 118/80 98.2 F (36.8 C) Oral 79 18 98 %  06/09/21 1437 130/77 98.2 F (36.8 C) Oral 91 17 99 %  06/09/21 0739 (!) 138/96 97.9 F (36.6 C) Oral 89 16 100 %  .  Recent laboratory studies: No results found.  Discharge Medications:   Allergies as of 06/10/2021       Reactions   Aspirin    Avoid due to bariatric surgery   Nsaids    Avoid due to bariatric surgery   Oxycodone Hcl    Hallucinations, agitation         Medication List     TAKE these medications    acetaminophen 650 MG  CR tablet Commonly known as: TYLENOL Take 1,300 mg by mouth every 8 (eight) hours as needed for pain.   atorvastatin 20 MG tablet Commonly known as: LIPITOR Take 20 mg by mouth at bedtime.   B-12 SL Place 1 tablet under the tongue daily.   CALCIUM CITRATE + D PO Take 1 tablet by mouth 2 (two) times daily.   diphenhydrAMINE 25 MG tablet Commonly known as: BENADRYL Take 12.5-25 mg by mouth at bedtime.   Eliquis 5 MG Tabs tablet Generic drug: apixaban Take 5 mg by mouth 2 (two) times daily.   fludrocortisone 0.1 MG tablet Commonly known as: FLORINEF Take 0.1 mg by mouth daily.   gabapentin 100 MG capsule Commonly known as: NEURONTIN Take 200 mg by mouth at bedtime.   HYDROcodone-acetaminophen 5-325 MG tablet Commonly known as: NORCO/VICODIN Take 1 tablet by mouth every 4 (four) hours as needed.   latanoprost 0.005 % ophthalmic solution Commonly known as: XALATAN Place 1 drop into both eyes at bedtime.   LORazepam 1 MG tablet Commonly known as: ATIVAN Take 0.5 mg by mouth every 4 (four) hours.   One Daily Multivitamin Men Tabs Take 1 tablet by mouth 2 (two) times daily.   pantoprazole 40 MG tablet Commonly known  as: PROTONIX Take 80 mg by mouth at bedtime.   PROBIOTIC PO Take 1 capsule by mouth daily.   sertraline 100 MG tablet Commonly known as: ZOLOFT Take 100 mg by mouth at bedtime.   SUPER B COMPLEX/C PO Take 1 tablet by mouth daily.   SYSTANE OP Place 1 drop into both eyes daily as needed (dry eyes).   zolpidem 10 MG tablet Commonly known as: AMBIEN Take 5-10 mg by mouth at bedtime.        Diagnostic Studies: CT PELVIS W CONTRAST  Result Date: 06/06/2021 CLINICAL DATA:  Inpatient. Indeterminate left seminal vesicle region soft tissue mass on recent unenhanced CT. EXAM: CT PELVIS WITH CONTRAST TECHNIQUE: Multidetector CT imaging of the pelvis was performed using the standard protocol following the bolus administration of intravenous contrast.  CONTRAST:  114mL OMNIPAQUE IOHEXOL 300 MG/ML  SOLN COMPARISON:  05/25/2021 unenhanced CT abdomen/pelvis. FINDINGS: Urinary Tract: Normal bladder. Normal caliber ureters. Exophytic homogeneous 4.7 cm hypodense anterior interpolar left renal cortical lesion (series 3/image 1), not substantially changed since 01/26/2011 CT abdomen study, where it was characterized as a benign complex cyst. Nonobstructing stones in the visualized lower kidneys bilaterally, largest 6 mm in the lower left kidney. Previously visualized mild-to-moderate left hydronephrosis has improved and is now minimal. No UPJ or ureteral stones on today's scan. Bowel: Moderate sigmoid diverticulosis. Moderate diffuse colorectal stool. No dilated pelvic small bowel loops. No bowel wall thickening. Vascular/Lymphatic: Atherosclerotic nonaneurysmal visualized abdominal aorta. Mildly dilated 2.2 cm diameter left common iliac artery. No pathologically enlarged pelvic lymph nodes. Reproductive: Top-normal size prostate. Mildly asymmetrically enlarged homogeneous left seminal vesicle, which measures 3.5 x 1.9 cm (series 3/image 38), significantly decreased from 5.5 x 3.1 cm on 05/25/2021 unenhanced CT abdomen/pelvis study using similar measurement technique. The right seminal vesicle is asymmetrically atrophic, unchanged. Other: No pelvic ascites or pneumoperitoneum. No focal fluid collections. Musculoskeletal: No aggressive appearing focal osseous lesions. Marked degenerative disc disease in the visualized lower lumbar spine, most prominent at L3-4. IMPRESSION: 1. Mildly asymmetrically enlarged homogeneous left seminal vesicle, significantly decreased in size since 05/25/2021 unenhanced CT abdomen/pelvis study. Findings are currently within normal limits. No discrete mass or fluid collection. 2. No pelvic lymphadenopathy. 3. Nonobstructing bilateral nephrolithiasis. Previously visualized left hydroureteronephrosis has improved since 05/25/2021 CT, with  residual minimal left hydronephrosis in the visualized left kidney. No residual UPJ or ureteral stones on today's pelvic CT study. 4. Moderate sigmoid diverticulosis. Moderate colorectal stool volume, suggesting constipation. 5. Aortic Atherosclerosis (ICD10-I70.0). Electronically Signed   By: Ilona Sorrel M.D.   On: 06/06/2021 12:22   VAS [place] LOWER EXTREMITY VENOUS REFLUX  Result Date: 06/01/2021  Lower Venous Reflux Study Patient Name:  Francisco Padilla  Date of Exam:   06/01/2021 Medical Rec #: [id number]      Accession #:    [id number] Date of Birth: 05-Apr-1951      Patient Gender: M Patient Age:   40 years Exam Location:  [facility] Procedure:      VAS [place] LOWER EXTREMITY VENOUS REFLUX Referring Phys: Servando Snare --------------------------------------------------------------------------------  Indications: Varicosities, and Swelling.  Risk Factors: Previous bilateral great saphenous vein ablation: Right 11/25/2008, Left 11/11/2008. Comparison Study: 07/01/2019: Lt- No evidence of deep vein thrombosis. Common                   femoral vein reflux. GSV reflux from the SFJ into the calf. No  evidence of SSV reflux. Performing Technologist: Ivan Croft  Examination Guidelines: A complete evaluation includes B-mode imaging, spectral Doppler, color Doppler, and power Doppler as needed of all accessible portions of each vessel. Bilateral testing is considered an integral part of a complete examination. Limited examinations for reoccurring indications may be performed as noted. The reflux portion of the exam is performed with the patient in reverse Trendelenburg. Significant venous reflux is defined as >500 ms in the superficial venous system, and >1 second in the deep venous system.  +--------------+---------+------+-----------+------------+---------------------+ LEFT          Reflux NoRefluxReflux TimeDiameter cmsComments                                      Yes                                                +--------------+---------+------+-----------+------------+---------------------+ CFV                     yes   >1 second                                   +--------------+---------+------+-----------+------------+---------------------+ FV mid                  yes   >1 second                                   +--------------+---------+------+-----------+------------+---------------------+ Popliteal     no                                                          +--------------+---------+------+-----------+------------+---------------------+ GSV at SFJ              yes    >500 ms      1.15                          +--------------+---------+------+-----------+------------+---------------------+ GSV prox thigh          yes    >500 ms      0.75                          +--------------+---------+------+-----------+------------+---------------------+ GSV mid thigh           yes    >500 ms      1.46    small area of chronic                                                     thrombus              +--------------+---------+------+-----------+------------+---------------------+ GSV dist thigh          yes    >500 ms  1.11    small area of chronic                                                     thrombus              +--------------+---------+------+-----------+------------+---------------------+ GSV at knee             yes    >500 ms      0.70                          +--------------+---------+------+-----------+------------+---------------------+ GSV prox calf           yes    >500 ms      0.74                          +--------------+---------+------+-----------+------------+---------------------+ SSV Pop Fossa no                            0.52                          +--------------+---------+------+-----------+------------+---------------------+ SSV prox calf           yes    >500 ms       0.39                          +--------------+---------+------+-----------+------------+---------------------+ SSV mid calf  no                            0.39                          +--------------+---------+------+-----------+------------+---------------------+   Summary: Left: - No evidence of deep vein thrombosis seen in the left lower extremity, from the common femoral through the popliteal veins.  - Venous reflux is noted in the left common femoral vein. - Venous reflux is noted in the left sapheno-femoral junction. - Venous reflux is noted in the left greater saphenous vein in the thigh. - Venous reflux is noted in the left greater saphenous vein in the calf. - Venous reflux is noted in the left femoral vein. - Venous reflux is noted in the mid left short saphenous vein.  *See table(s) above for measurements and observations. Electronically signed by Servando Snare MD on 06/01/2021 at 3:59:56 PM.    Final     Patient benefited maximally from their hospital stay and there were no complications.     Disposition: Discharge disposition: 03-Skilled Nursing Facility      Discharge Instructions     Call MD / Call 911   Complete by: As directed    If you experience chest pain or shortness of breath, CALL 911 and be transported to the hospital emergency room.  If you develope a fever above 101 F, pus (white drainage) or increased drainage or redness at the wound, or calf pain, call your surgeon's office.   Call MD / Call 911   Complete by: As directed    If you experience chest pain or shortness of breath, CALL 911 and be transported to the hospital emergency room.  If you develope a fever above 101 F, pus (white drainage) or increased drainage or redness at the wound, or calf pain, call your surgeon's office.   Constipation Prevention   Complete by: As directed    Drink plenty of fluids.  Prune juice may be helpful.  You may use a stool softener, such as Colace (over the counter) 100 mg  twice a day.  Use MiraLax (over the counter) for constipation as needed.   Constipation Prevention   Complete by: As directed    Drink plenty of fluids.  Prune juice may be helpful.  You may use a stool softener, such as Colace (over the counter) 100 mg twice a day.  Use MiraLax (over the counter) for constipation as needed.   Diet - low sodium heart healthy   Complete by: As directed    Diet - low sodium heart healthy   Complete by: As directed    Increase activity slowly as tolerated   Complete by: As directed    Increase activity slowly as tolerated   Complete by: As directed    Post-operative opioid taper instructions:   Complete by: As directed    POST-OPERATIVE OPIOID TAPER INSTRUCTIONS: It is important to wean off of your opioid medication as soon as possible. If you do not need pain medication after your surgery it is ok to stop day one. Opioids include: Codeine, Hydrocodone(Norco, Vicodin), Oxycodone(Percocet, oxycontin) and hydromorphone amongst others.  Long term and even short term use of opiods can cause: Increased pain response Dependence Constipation Depression Respiratory depression And more.  Withdrawal symptoms can include Flu like symptoms Nausea, vomiting And more Techniques to manage these symptoms Hydrate well Eat regular healthy meals Stay active Use relaxation techniques(deep breathing, meditating, yoga) Do Not substitute Alcohol to help with tapering If you have been on opioids for less than two weeks and do not have pain than it is ok to stop all together.  Plan to wean off of opioids This plan should start within one week post op of your joint replacement. Maintain the same interval or time between taking each dose and first decrease the dose.  Cut the total daily intake of opioids by one tablet each day Next start to increase the time between doses. The last dose that should be eliminated is the evening dose.      Post-operative opioid taper  instructions:   Complete by: As directed    POST-OPERATIVE OPIOID TAPER INSTRUCTIONS: It is important to wean off of your opioid medication as soon as possible. If you do not need pain medication after your surgery it is ok to stop day one. Opioids include: Codeine, Hydrocodone(Norco, Vicodin), Oxycodone(Percocet, oxycontin) and hydromorphone amongst others.  Long term and even short term use of opiods can cause: Increased pain response Dependence Constipation Depression Respiratory depression And more.  Withdrawal symptoms can include Flu like symptoms Nausea, vomiting And more Techniques to manage these symptoms Hydrate well Eat regular healthy meals Stay active Use relaxation techniques(deep breathing, meditating, yoga) Do Not substitute Alcohol to help with tapering If you have been on opioids for less than two weeks and do not have pain than it is ok to stop all together.  Plan to wean off of opioids This plan should start within one week post op of your joint replacement. Maintain the same interval or time between taking each dose and first decrease the dose.  Cut the total daily intake of opioids by one tablet each day Next start  to increase the time between doses. The last dose that should be eliminated is the evening dose.          Follow-up Information     Newt Minion, MD Follow up in 2 week(s).   Specialty: Orthopedic Surgery Contact information: [address] [place] [address] [phone]                  Signed: Newt Minion 06/10/2021, 7:35 AM

## 2021-06-13 DIAGNOSIS — I4891 Unspecified atrial fibrillation: Secondary | ICD-10-CM | POA: Diagnosis not present

## 2021-06-13 DIAGNOSIS — F419 Anxiety disorder, unspecified: Secondary | ICD-10-CM | POA: Diagnosis not present

## 2021-06-13 DIAGNOSIS — I959 Hypotension, unspecified: Secondary | ICD-10-CM | POA: Diagnosis not present

## 2021-06-13 DIAGNOSIS — Z7901 Long term (current) use of anticoagulants: Secondary | ICD-10-CM | POA: Diagnosis not present

## 2021-06-13 DIAGNOSIS — G629 Polyneuropathy, unspecified: Secondary | ICD-10-CM | POA: Diagnosis not present

## 2021-06-13 DIAGNOSIS — F32A Depression, unspecified: Secondary | ICD-10-CM | POA: Diagnosis not present

## 2021-06-13 DIAGNOSIS — Z96652 Presence of left artificial knee joint: Secondary | ICD-10-CM | POA: Diagnosis not present

## 2021-06-13 DIAGNOSIS — F5101 Primary insomnia: Secondary | ICD-10-CM | POA: Diagnosis not present

## 2021-06-13 DIAGNOSIS — E785 Hyperlipidemia, unspecified: Secondary | ICD-10-CM | POA: Diagnosis not present

## 2021-06-13 DIAGNOSIS — Z9884 Bariatric surgery status: Secondary | ICD-10-CM | POA: Diagnosis not present

## 2021-06-17 DIAGNOSIS — I959 Hypotension, unspecified: Secondary | ICD-10-CM | POA: Diagnosis not present

## 2021-06-17 DIAGNOSIS — I4891 Unspecified atrial fibrillation: Secondary | ICD-10-CM | POA: Diagnosis not present

## 2021-06-17 DIAGNOSIS — Z96652 Presence of left artificial knee joint: Secondary | ICD-10-CM | POA: Diagnosis not present

## 2021-06-20 ENCOUNTER — Telehealth: Payer: Self-pay | Admitting: Orthopedic Surgery

## 2021-06-20 DIAGNOSIS — I4819 Other persistent atrial fibrillation: Secondary | ICD-10-CM | POA: Diagnosis not present

## 2021-06-20 DIAGNOSIS — Z96652 Presence of left artificial knee joint: Secondary | ICD-10-CM | POA: Diagnosis not present

## 2021-06-20 DIAGNOSIS — I959 Hypotension, unspecified: Secondary | ICD-10-CM | POA: Diagnosis not present

## 2021-06-20 DIAGNOSIS — F419 Anxiety disorder, unspecified: Secondary | ICD-10-CM | POA: Diagnosis not present

## 2021-06-20 NOTE — Telephone Encounter (Signed)
Pt called stating he had surgery 06/03/21 and has some questions about the stiches coming out. He would like a CB please.   [phone]

## 2021-06-20 NOTE — Telephone Encounter (Signed)
Triage received a call from SNF and pt will be worked into the clinic tomorrow afternoon with Dr. Sharol Given for post op eval and staple removal.

## 2021-06-21 ENCOUNTER — Ambulatory Visit (INDEPENDENT_AMBULATORY_CARE_PROVIDER_SITE_OTHER): Payer: Medicare Other

## 2021-06-21 ENCOUNTER — Ambulatory Visit (INDEPENDENT_AMBULATORY_CARE_PROVIDER_SITE_OTHER): Payer: Medicare Other | Admitting: Orthopedic Surgery

## 2021-06-21 ENCOUNTER — Other Ambulatory Visit: Payer: Self-pay

## 2021-06-21 DIAGNOSIS — Z96652 Presence of left artificial knee joint: Secondary | ICD-10-CM

## 2021-06-22 ENCOUNTER — Encounter: Payer: Self-pay | Admitting: Orthopedic Surgery

## 2021-06-22 NOTE — Progress Notes (Signed)
Office Visit Note   Patient: Francisco Padilla           Date of Birth: [birth year]           MRN: [id number] Visit Date: 06/21/2021              Requested by: Lajean Manes, MD [address] [place] [address] [place],  [place] [address] PCP: Lajean Manes, MD  Chief Complaint  Patient presents with   Left Knee - Routine Post Op    06/03/21 left total knee replacement       HPI: Patient is a 70 year old gentleman who presents 2 weeks status post left total knee arthroplasty.  Patient is currently in skilled nursing in [place].  Assessment & Plan: Visit Diagnoses:  1. History of left knee replacement     Plan: Continue with therapy and strengthening.  Staples are harvested Steri-Strips applied.  Recommended a size extra extra-large compression stocking to be worn around-the-clock for the left leg.  Follow-Up Instructions: Return in about 4 weeks (around 07/19/2021).   Ortho Exam  Patient is alert, oriented, no adenopathy, well-dressed, normal affect, normal respiratory effort. Examination the incision is well-healed staples are harvested Steri-Strips applied.  Patient does have increased swelling in the left lower extremity calf measures 43 cm in circumference right calf is 36 cm in circumference.  Imaging: No results found. No images are attached to the encounter.  Labs: Lab Results  Component Value Date   REPTSTATUS 10/23/2009 FINAL 10/21/2009   CULT NO GROWTH 10/21/2009     Lab Results  Component Value Date   ALBUMIN 4.1 10/22/2009   ALBUMIN 4.6 10/21/2009    No results found for: MG No results found for: VD25OH  No results found for: PREALBUMIN CBC EXTENDED Latest Ref Rng & Units 05/31/2021 10/23/2009 10/23/2009  WBC 4.0 - 10.5 K/uL 6.8 6.2 6.3  RBC 4.22 - 5.81 MIL/uL 4.55 3.21(L) 3.29(L)  HGB 13.0 - 17.0 g/dL 14.0 10.4(L) 10.6(L)  HCT 39.0 - 52.0 % 41.8 30.3(L) 31.1(L)  PLT 150 - 400 K/uL 188 194 202  NEUTROABS 1.7 - 7.7 K/uL - - -  LYMPHSABS 0.7 - 4.0  K/uL - - -     There is no height or weight on file to calculate BMI.  Orders:  Orders Placed This Encounter  Procedures   XR Knee 1-2 Views Left   No orders of the defined types were placed in this encounter.    Procedures: No procedures performed  Clinical Data: No additional findings.  ROS:  All other systems negative, except as noted in the HPI. Review of Systems  Objective: Vital Signs: There were no vitals taken for this visit.  Specialty Comments:  No specialty comments available.  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   Unilateral primary osteoarthritis, left knee    Longstanding persistent atrial fibrillation ([place]) 04/26/2021   Preop cardiovascular exam 04/26/2021   Orthostatic hypotension 02/20/2019   Chronic anticoagulation 03/05/2015   Abnormal chest x-ray with multiple lung nodules 12/14/2014   AS (aortic stenosis) 12/14/2014   Atrial flutter by electrocardiogram ([place]) 12/14/2014   CAD (coronary artery disease) 12/14/2014   DJD (degenerative joint disease) 12/14/2014   History of GI bleed 12/14/2014   Nephrolithiasis 12/14/2014   Other activity(E029.9) 12/14/2014   Prediabetes 12/14/2014   Thrombocytopenia ([place]) 12/14/2014   Arterial blood pressure decreased 10/28/2014   Atrial fibrillation ([place]) 09/02/2014   Morbid  obesity ([place]) 09/02/2014   AF (paroxysmal atrial fibrillation) ([place]) 08/03/2014   Gastroduodenal ulcer 08/03/2014   Apnea, sleep 08/03/2014   Glaucoma suspect 02/21/2013   Glaucoma suspect of both eyes 02/21/2013   Herpes 04/11/2012   Routine general medical examination at a health care facility 04/11/2012   DYSLIPIDEMIA 05/31/2009   DEPRESSION/ANXIETY 05/31/2009   ATTENTION DEFICIT DISORDER 05/31/2009   Obstructive sleep apnea 05/31/2009   GERD 05/31/2009   GOUT, HX OF 05/31/2009   Past Medical History:  Diagnosis Date   ADD (attention deficit  disorder)    Anxiety 2007   Asthma as child   Asymptomatic gallstones    Atrial fibrillation ([facility])    Atrial flutter ([facility])    Depression    DJD (degenerative joint disease) of knee    Dysrhythmia    Chronic Atrial Fib- seeing Dr. Lenna Sciara [facility]   H/O peptic ulcer    past history with GI bleed- cauterization done throught scope   History of kidney stones    pt claims urology discovered a kidney stone last week 05/25/21   Hypercholesterolemia    Orthostatic hypotension    Pneumonia as child   Pneumothorax on left 05/20/2009   Prediabetes    none now   Pulmonary nodules    Sleep apnea    cpap set on 13   Thrombocytopenia ([facility])    pt denies   Thyromegaly    Varicose veins     Family History  Problem Relation Age of Onset   Heart disease Mother    Heart failure Mother    Heart disease Father    Heart attack Father    Stroke Neg Hx     Past Surgical History:  Procedure Laterality Date   BIOPSY  04/18/2018   Procedure: BIOPSY;  Surgeon: Ronnette Juniper, MD;  Location: [facility];  Service: Gastroenterology;;   ESOPHAGOGASTRODUODENOSCOPY N/A 04/18/2018   Procedure: ESOPHAGOGASTRODUODENOSCOPY (EGD);  Surgeon: Ronnette Juniper, MD;  Location: [facility];  Service: Gastroenterology;  Laterality: N/A;   ESOPHAGOGASTRODUODENOSCOPY (EGD) WITH PROPOFOL N/A 01/05/2015   Procedure: ESOPHAGOGASTRODUODENOSCOPY (EGD) WITH PROPOFOL;  Surgeon: Garlan Fair, MD;  Location: [facility];  Service: Endoscopy;  Laterality: N/A;   GASTRIC ROUX-EN-Y     '08-[name] (weight stable around 302 #   KNEE ARTHROSCOPY     TONSILLECTOMY     TOTAL KNEE ARTHROPLASTY Left 06/03/2021   Procedure: LEFT TOTAL KNEE ARTHROPLASTY;  Surgeon: Newt Minion, MD;  Location: [facility];  Service: Orthopedics;  Laterality: Left;   ULNAR NERVE TRANSPOSITION Right 15 yrs ago   Social History   Occupational History   Not on file  Tobacco Use   Smoking status: Never   Smokeless tobacco: Never  Vaping Use   Vaping  Use: Never used  Substance and Sexual Activity   Alcohol use: No    Alcohol/week: 0.0 standard drinks   Drug use: Never   Sexual activity: Yes

## 2021-06-23 DIAGNOSIS — F419 Anxiety disorder, unspecified: Secondary | ICD-10-CM | POA: Diagnosis not present

## 2021-06-23 DIAGNOSIS — K219 Gastro-esophageal reflux disease without esophagitis: Secondary | ICD-10-CM | POA: Diagnosis not present

## 2021-06-23 DIAGNOSIS — Z471 Aftercare following joint replacement surgery: Secondary | ICD-10-CM | POA: Diagnosis not present

## 2021-06-23 DIAGNOSIS — I4819 Other persistent atrial fibrillation: Secondary | ICD-10-CM | POA: Diagnosis not present

## 2021-06-23 DIAGNOSIS — H40009 Preglaucoma, unspecified, unspecified eye: Secondary | ICD-10-CM | POA: Diagnosis not present

## 2021-06-23 DIAGNOSIS — I350 Nonrheumatic aortic (valve) stenosis: Secondary | ICD-10-CM | POA: Diagnosis not present

## 2021-06-23 DIAGNOSIS — F418 Other specified anxiety disorders: Secondary | ICD-10-CM | POA: Diagnosis not present

## 2021-06-23 DIAGNOSIS — F5101 Primary insomnia: Secondary | ICD-10-CM | POA: Diagnosis not present

## 2021-06-23 DIAGNOSIS — J45909 Unspecified asthma, uncomplicated: Secondary | ICD-10-CM | POA: Diagnosis not present

## 2021-06-23 DIAGNOSIS — R7303 Prediabetes: Secondary | ICD-10-CM | POA: Diagnosis not present

## 2021-06-23 DIAGNOSIS — M109 Gout, unspecified: Secondary | ICD-10-CM | POA: Diagnosis not present

## 2021-06-23 DIAGNOSIS — M1990 Unspecified osteoarthritis, unspecified site: Secondary | ICD-10-CM | POA: Diagnosis not present

## 2021-06-23 DIAGNOSIS — F900 Attention-deficit hyperactivity disorder, predominantly inattentive type: Secondary | ICD-10-CM | POA: Diagnosis not present

## 2021-06-23 DIAGNOSIS — I4811 Longstanding persistent atrial fibrillation: Secondary | ICD-10-CM | POA: Diagnosis not present

## 2021-06-23 DIAGNOSIS — Z96652 Presence of left artificial knee joint: Secondary | ICD-10-CM | POA: Diagnosis not present

## 2021-06-23 DIAGNOSIS — E785 Hyperlipidemia, unspecified: Secondary | ICD-10-CM | POA: Diagnosis not present

## 2021-06-23 DIAGNOSIS — G4733 Obstructive sleep apnea (adult) (pediatric): Secondary | ICD-10-CM | POA: Diagnosis not present

## 2021-06-23 DIAGNOSIS — I4892 Unspecified atrial flutter: Secondary | ICD-10-CM | POA: Diagnosis not present

## 2021-06-23 DIAGNOSIS — I2510 Atherosclerotic heart disease of native coronary artery without angina pectoris: Secondary | ICD-10-CM | POA: Diagnosis not present

## 2021-06-23 DIAGNOSIS — I959 Hypotension, unspecified: Secondary | ICD-10-CM | POA: Diagnosis not present

## 2021-06-24 ENCOUNTER — Telehealth: Payer: Self-pay

## 2021-06-24 NOTE — Telephone Encounter (Signed)
Patient called triage. He had surgery on his knee with Dr.Duda on 06/03/2021. He states that all of his steri strips have now fallen off. He wants to confirm that Dr.Duda is okay with him applying vasoline to his incision area. Call back [phone] Thanks!

## 2021-06-24 NOTE — Telephone Encounter (Signed)
I called and he states that he is applying Vaseline to his incision because his skin is dry. He wanted to know if he could wear his compression sock and advised this would be ok.

## 2021-06-27 ENCOUNTER — Telehealth: Payer: Self-pay | Admitting: Orthopedic Surgery

## 2021-06-27 ENCOUNTER — Other Ambulatory Visit: Payer: Self-pay | Admitting: Orthopedic Surgery

## 2021-06-27 NOTE — Telephone Encounter (Signed)
Pt called requesting a referral for physical therapy for left knee be sent to [facility]. Fax number is [phone]. Pt phone number is [phone].

## 2021-06-27 NOTE — Telephone Encounter (Signed)
Referral order faxed to [facility] at fax # provided.

## 2021-06-28 ENCOUNTER — Other Ambulatory Visit: Payer: Self-pay | Admitting: Orthopedic Surgery

## 2021-06-28 ENCOUNTER — Telehealth: Payer: Self-pay | Admitting: Orthopedic Surgery

## 2021-06-28 DIAGNOSIS — Z96652 Presence of left artificial knee joint: Secondary | ICD-10-CM

## 2021-06-28 NOTE — Telephone Encounter (Signed)
Referral sent to our in house PT dept.

## 2021-06-28 NOTE — Telephone Encounter (Signed)
Pt called requesting a new referral be sent for physical therapy. Pt states [facility] stated to have a 3 wk awaiting period. Pt is asking to go to where Dr. Sharol Given recommended. [facility]. Fax number [phone]. Pt phone number is [phone].

## 2021-06-29 ENCOUNTER — Ambulatory Visit (INDEPENDENT_AMBULATORY_CARE_PROVIDER_SITE_OTHER): Payer: Medicare Other | Admitting: Physical Therapy

## 2021-06-29 ENCOUNTER — Other Ambulatory Visit: Payer: Self-pay

## 2021-06-29 ENCOUNTER — Encounter: Payer: Self-pay | Admitting: Physical Therapy

## 2021-06-29 DIAGNOSIS — R2689 Other abnormalities of gait and mobility: Secondary | ICD-10-CM

## 2021-06-29 DIAGNOSIS — M25662 Stiffness of left knee, not elsewhere classified: Secondary | ICD-10-CM | POA: Diagnosis not present

## 2021-06-29 DIAGNOSIS — M25562 Pain in left knee: Secondary | ICD-10-CM

## 2021-06-29 DIAGNOSIS — R600 Localized edema: Secondary | ICD-10-CM

## 2021-06-29 NOTE — Therapy (Signed)
[facility] Physical Therapy [address] [place], [place], [address] Phone: [phone]   Fax:  [phone]  Physical Therapy Evaluation  Patient Details  Name: Francisco Padilla MRN: [id number] Date of Birth: 1950/11/27 Referring Provider (PT): Newt Minion, MD   Encounter Date: 06/29/2021   PT End of Session - 06/29/21 1507     Visit Number 1    Number of Visits 16    Date for PT Re-Evaluation 08/24/21    Authorization Type Medicare/BCBS    Progress Note Due on Visit 10    PT Start Time 1432    PT Stop Time 0263    PT Time Calculation (min) 34 min    Activity Tolerance Patient tolerated treatment well    Behavior During Therapy [facility] for tasks assessed/performed             Past Medical History:  Diagnosis Date   ADD (attention deficit disorder)    Anxiety 2007   Asthma as child   Asymptomatic gallstones    Atrial fibrillation ([place])    Atrial flutter (HCC)    Depression    DJD (degenerative joint disease) of knee    Dysrhythmia    Chronic Atrial Fib- seeing Dr. Lenna Sciara [facility]   H/O peptic ulcer    past history with GI bleed- cauterization done throught scope   History of kidney stones    pt claims urology discovered a kidney stone last week 05/25/21   Hypercholesterolemia    Orthostatic hypotension    Pneumonia as child   Pneumothorax on left 05/20/2009   Prediabetes    none now   Pulmonary nodules    Sleep apnea    cpap set on 13   Thrombocytopenia ([place])    pt denies   Thyromegaly    Varicose veins     Past Surgical History:  Procedure Laterality Date   BIOPSY  04/18/2018   Procedure: BIOPSY;  Surgeon: Ronnette Juniper, MD;  Location: [facility];  Service: Gastroenterology;;   ESOPHAGOGASTRODUODENOSCOPY N/A 04/18/2018   Procedure: ESOPHAGOGASTRODUODENOSCOPY (EGD);  Surgeon: Ronnette Juniper, MD;  Location: [facility];  Service: Gastroenterology;  Laterality: N/A;   ESOPHAGOGASTRODUODENOSCOPY (EGD) WITH PROPOFOL N/A 01/05/2015    Procedure: ESOPHAGOGASTRODUODENOSCOPY (EGD) WITH PROPOFOL;  Surgeon: Garlan Fair, MD;  Location: [facility];  Service: Endoscopy;  Laterality: N/A;   GASTRIC ROUX-EN-Y     '08-[name] (weight stable around 302 #   KNEE ARTHROSCOPY     TONSILLECTOMY     TOTAL KNEE ARTHROPLASTY Left 06/03/2021   Procedure: LEFT TOTAL KNEE ARTHROPLASTY;  Surgeon: Newt Minion, MD;  Location: [place];  Service: Orthopedics;  Laterality: Left;   ULNAR NERVE TRANSPOSITION Right 15 yrs ago    There were no vitals filed for this visit.    Subjective Assessment - 06/29/21 1435     Subjective Pt is a 70 y/o male who presents to [facility] s/p Lt TKA on 06/03/21, with ST[facility]SNF following.  He is amb without an AD at this time.    Limitations Standing;Walking    Patient Stated Goals improve mobility and pain    Currently in Pain? Yes    Pain Score 6     Pain Location Knee    Pain Orientation Left    Pain Descriptors / Indicators Aching;Discomfort    Pain Type Acute pain;Surgical pain    Pain Onset More than a month ago    Pain Frequency Intermittent    Aggravating Factors  walking, bending knee  Pain Relieving Factors rest, ice, tylenol                [facility] PT Assessment - 06/29/21 1439       Assessment   Medical Diagnosis Z96.652 (ICD-10-CM) - History of left knee replacement    Referring Provider (PT) Newt Minion, MD    Onset Date/Surgical Date 06/03/21    Hand Dominance Right    Next MD Visit 07/28/21    Prior Therapy SNF      Precautions   Precautions None      Restrictions   Weight Bearing Restrictions No      Balance Screen   Has the patient fallen in the past 6 months Yes    How many times? 1    Has the patient had a decrease in activity level because of a fear of falling?  No    Is the patient reluctant to leave their home because of a fear of falling?  No      Home Environment   Living Environment Private residence    [name]   husband recently passed away    Type of [place] entrance    [place] Two level;Bed/bath upstairs    Alternate Level Stairs-Number of Steps 14    Alternate Level Stairs-Rails Right    Home Equipment Walker - 2 wheels      Prior Function   Level of Independence Independent    Vocation Retired    Leisure "I go to [employer]." go shopping, started at [employer] in June (cycling)      Cognition   Overall Cognitive Status Within Functional Limits for tasks assessed      Observation/Other Assessments   Focus on Therapeutic Outcomes (FOTO)  34 (predicted 60)      ROM / Strength   AROM / PROM / Strength AROM;PROM;Strength      AROM   AROM Assessment Site Knee    Right/Left Knee Left    Left Knee Extension -12    Left Knee Flexion 102      PROM   PROM Assessment Site Knee    Right/Left Knee Left    Left Knee Extension 0    Left Knee Flexion 112      Strength   Overall Strength Comments Lt knee 3-/5      Palpation   Palpation comment swelling present in LLE      Ambulation/Gait   Gait Pattern Decreased stance time - left;Decreased step length - right;Decreased hip/knee flexion - left;Antalgic                        Objective measurements completed on examination: See above findings.       [facility] Adult PT Treatment/Exercise - 06/29/21 1439       Exercises   Exercises Other Exercises    Other Exercises  see pt instructions - pt performed PRN for understanding                     PT Education - 06/29/21 1507     Education Details HEP    Person(s) Educated Patient    Methods Explanation;Demonstration;Handout    Comprehension Verbalized understanding;Returned demonstration;Need further instruction              PT Short Term Goals - 06/29/21 1508       PT SHORT TERM GOAL #1   Title Independent with  initial HEP    Time 4    Period Weeks    Status New    Target Date 07/27/21      PT SHORT TERM GOAL #2   Title Lt knee AROM improved 0-105 for  improved function    Time 4    Period Weeks    Status New    Target Date 07/27/21               PT Long Term Goals - 06/29/21 1513       PT LONG TERM GOAL #1   Title Independent with final HEP    Time 8    Period Weeks    Status New    Target Date 08/24/21      PT LONG TERM GOAL #2   Title Lt knee AROM improved 0-110 for improved function    Time 8    Period Weeks    Status New    Target Date 08/24/21      PT LONG TERM GOAL #3   Title Amb independently without significant deviations for improved function    Time 8    Period Weeks    Status New    Target Date 08/24/21      PT LONG TERM GOAL #4   Title Report pain < 3/10 for improved function    Time 8    Period Weeks    Status New    Target Date 08/24/21      PT LONG TERM GOAL #5   Title FOTO score improved to 60 for improved function    Time 8    Period Weeks    Status New    Target Date 08/24/21                    Plan - 06/29/21 1430     Clinical Impression Statement Pt is a 70 y/o male who presents to [facility] s/p Lt TKA on 06/03/21.  He demonstrates decreased strength, ROM, increased edema and pain with gait abnormalities affecting functional mobility.  Pt will benefit from PT to address deficits listed.    Personal Factors and Comorbidities Comorbidity 3+    Comorbidities ADD, anxiety, deptression, a-fib    Examination-Activity Limitations Locomotion Level;Transfers;Sit;Sleep;Squat;Stairs;Stand;Lift    Examination-Participation Restrictions Cleaning;Yard Work;Community Activity;Driving;Shop    Stability/Clinical Decision Making Evolving/Moderate complexity    Clinical Decision Making Moderate    Rehab Potential Good    PT Frequency 2x / week    PT Duration 8 weeks    PT Treatment/Interventions ADLs/Self Care Home Management;Cryotherapy;Electrical Stimulation;Moist Heat;Balance training;Therapeutic exercise;Therapeutic activities;Functional mobility training;Stair training;Gait  training;Neuromuscular re-education;Patient/family education;Scar mobilization;Manual techniques;Passive range of motion;Dry needling;Taping;Vasopneumatic Device    PT Next Visit Plan review HEP, maximize ROM, quad strengthening, leg press    PT Home Exercise Plan Access Code: [email or web address]    Consulted and Agree with Plan of Care Patient             Patient will benefit from skilled therapeutic intervention in order to improve the following deficits and impairments:  Abnormal gait, Pain, Decreased mobility, Decreased range of motion, Decreased endurance, Decreased strength, Impaired flexibility, Decreased balance  Visit Diagnosis: Acute pain of left knee - Plan: PT plan of care cert/re-cert  Stiffness of left knee, not elsewhere classified - Plan: PT plan of care cert/re-cert  Other abnormalities of gait and mobility - Plan: PT plan of care cert/re-cert  Localized edema - Plan: PT plan of care cert/re-cert  Problem List Patient Active Problem List   Diagnosis Date Noted   Total knee replacement status, 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   Unilateral primary osteoarthritis, left knee    Longstanding persistent atrial fibrillation ([place]) 04/26/2021   Preop cardiovascular exam 04/26/2021   Orthostatic hypotension 02/20/2019   Chronic anticoagulation 03/05/2015   Abnormal chest x-ray with multiple lung nodules 12/14/2014   AS (aortic stenosis) 12/14/2014   Atrial flutter by electrocardiogram ([place]) 12/14/2014   CAD (coronary artery disease) 12/14/2014   DJD (degenerative joint disease) 12/14/2014   History of GI bleed 12/14/2014   Nephrolithiasis 12/14/2014   Other activity(E029.9) 12/14/2014   Prediabetes 12/14/2014   Thrombocytopenia ([place]) 12/14/2014   Arterial blood pressure decreased 10/28/2014   Atrial fibrillation ([place]) 09/02/2014   Morbid obesity ([place]) 09/02/2014   AF (paroxysmal atrial fibrillation) ([place]) 08/03/2014   Gastroduodenal ulcer 08/03/2014   Apnea, sleep 08/03/2014    Glaucoma suspect 02/21/2013   Glaucoma suspect of both eyes 02/21/2013   Herpes 04/11/2012   Routine general medical examination at a health care facility 04/11/2012   DYSLIPIDEMIA 05/31/2009   DEPRESSION/ANXIETY 05/31/2009   ATTENTION DEFICIT DISORDER 05/31/2009   Obstructive sleep apnea 05/31/2009   GERD 05/31/2009   GOUT, HX OF 05/31/2009      Laureen Abrahams, PT, DPT 06/29/21 3:16 PM     [facility] [address] [place], [place], [address] Phone: [phone]   Fax:  [phone]  Name: CHEVY SWEIGERT MRN: [id number] Date of Birth: 11/19/1950

## 2021-06-29 NOTE — Patient Instructions (Signed)
Access Code: [email or web address] URL: [email or web address] Date: 06/29/2021 Prepared by: Faustino Congress  Exercises Seated Knee Extension AROM - 5-10 x daily - 7 x weekly - 1 sets - 5-10 reps - 5 sec hold Quad Setting and Stretching - 5-10 x daily - 7 x weekly - 1 sets - 5-10 reps - 5 sec hold Supine Heel Slide with Strap - 5-10 x daily - 7 x weekly - 1 sets - 5-10 reps Seated Knee Flexion AAROM - 5-10 x daily - 7 x weekly - 1 sets - 5-10 reps - 10 sec hold

## 2021-06-30 ENCOUNTER — Encounter: Payer: Self-pay | Admitting: Physical Therapy

## 2021-06-30 ENCOUNTER — Ambulatory Visit (INDEPENDENT_AMBULATORY_CARE_PROVIDER_SITE_OTHER): Payer: Medicare Other | Admitting: Physical Therapy

## 2021-06-30 DIAGNOSIS — R2689 Other abnormalities of gait and mobility: Secondary | ICD-10-CM | POA: Diagnosis not present

## 2021-06-30 DIAGNOSIS — R600 Localized edema: Secondary | ICD-10-CM

## 2021-06-30 DIAGNOSIS — F332 Major depressive disorder, recurrent severe without psychotic features: Secondary | ICD-10-CM | POA: Diagnosis not present

## 2021-06-30 DIAGNOSIS — M25662 Stiffness of left knee, not elsewhere classified: Secondary | ICD-10-CM

## 2021-06-30 DIAGNOSIS — F411 Generalized anxiety disorder: Secondary | ICD-10-CM | POA: Diagnosis not present

## 2021-06-30 DIAGNOSIS — F902 Attention-deficit hyperactivity disorder, combined type: Secondary | ICD-10-CM | POA: Diagnosis not present

## 2021-06-30 DIAGNOSIS — F410 Panic disorder [episodic paroxysmal anxiety] without agoraphobia: Secondary | ICD-10-CM | POA: Diagnosis not present

## 2021-06-30 DIAGNOSIS — M25562 Pain in left knee: Secondary | ICD-10-CM

## 2021-06-30 NOTE — Therapy (Signed)
[facility] [address] [place], [place], [address] Phone: [phone]   Fax:  [phone]  Physical Therapy Treatment  Patient Details  Name: Francisco Padilla MRN: [id number] Date of Birth: 05-08-1951 Referring Provider (PT): Newt Minion, MD   Encounter Date: 06/30/2021   PT End of Session - 06/30/21 0841     Visit Number 2    Number of Visits 16    Date for PT Re-Evaluation 08/24/21    Authorization Type Medicare/BCBS    Progress Note Due on Visit 10    PT Start Time 0800    PT Stop Time 0842    PT Time Calculation (min) 42 min    Activity Tolerance Patient tolerated treatment well    Behavior During Therapy [facility] for tasks assessed/performed             Past Medical History:  Diagnosis Date   ADD (attention deficit disorder)    Anxiety 2007   Asthma as child   Asymptomatic gallstones    Atrial fibrillation (HCC)    Atrial flutter (HCC)    Depression    DJD (degenerative joint disease) of knee    Dysrhythmia    Chronic Atrial Fib- seeing Dr. Lenna Sciara [facility]   H/O peptic ulcer    past history with GI bleed- cauterization done throught scope   History of kidney stones    pt claims urology discovered a kidney stone last week 05/25/21   Hypercholesterolemia    Orthostatic hypotension    Pneumonia as child   Pneumothorax on left 05/20/2009   Prediabetes    none now   Pulmonary nodules    Sleep apnea    cpap set on 13   Thrombocytopenia ([place])    pt denies   Thyromegaly    Varicose veins     Past Surgical History:  Procedure Laterality Date   BIOPSY  04/18/2018   Procedure: BIOPSY;  Surgeon: Ronnette Juniper, MD;  Location: [facility];  Service: Gastroenterology;;   ESOPHAGOGASTRODUODENOSCOPY N/A 04/18/2018   Procedure: ESOPHAGOGASTRODUODENOSCOPY (EGD);  Surgeon: Ronnette Juniper, MD;  Location: [facility];  Service: Gastroenterology;  Laterality: N/A;   ESOPHAGOGASTRODUODENOSCOPY (EGD) WITH PROPOFOL N/A 01/05/2015    Procedure: ESOPHAGOGASTRODUODENOSCOPY (EGD) WITH PROPOFOL;  Surgeon: Garlan Fair, MD;  Location: [facility];  Service: Endoscopy;  Laterality: N/A;   GASTRIC ROUX-EN-Y     '08-[name] (weight stable around 302 #   KNEE ARTHROSCOPY     TONSILLECTOMY     TOTAL KNEE ARTHROPLASTY Left 06/03/2021   Procedure: LEFT TOTAL KNEE ARTHROPLASTY;  Surgeon: Newt Minion, MD;  Location: [facility];  Service: Orthopedics;  Laterality: Left;   ULNAR NERVE TRANSPOSITION Right 15 yrs ago    There were no vitals filed for this visit.   Subjective Assessment - 06/30/21 0759     Subjective knee is stiff from the drive; did a lot of walking yesterday so knee was a little more painful last night    Limitations Standing;Walking    Patient Stated Goals improve mobility and pain    Currently in Pain? Yes    Pain Score 2     Pain Location Knee    Pain Orientation Left    Pain Descriptors / Indicators Aching;Discomfort    Pain Type Acute pain;Surgical pain    Pain Onset More than a month ago    Pain Frequency Intermittent    Aggravating Factors  walking, bending knee    Pain Relieving Factors rest, ice, tylenol  [facility] Adult PT Treatment/Exercise - 06/30/21 0805       Exercises   Exercises Knee/Hip      Knee/Hip Exercises: Stretches   Knee: Self-Stretch Limitations LLE 10 x 10 sec; overpressure from RLE      Knee/Hip Exercises: Aerobic   Recumbent Bike seat 11; partial revolutions x 8 min      Knee/Hip Exercises: Seated   Long Arc Quad Left;3 sets;10 reps;Weights    Long Arc Quad Weight 3 lbs.    Long Arc Quad Limitations 5 sec hold      Knee/Hip Exercises: Supine   Quad Sets Both;10 reps    Quad Sets Limitations 5 sec hold; cues for Lt quad activation  -  improved when performing bil                     PT Education - 06/30/21 0840     Education Details discussed redness, and possiblitity of stiches working out of skin v/s  dissolving, POC and goals    Person(s) Educated Patient    Methods Explanation    Comprehension Verbalized understanding              PT Short Term Goals - 06/30/21 0859       PT SHORT TERM GOAL #1   Title Independent with initial HEP    Time 4    Period Weeks    Status On-going    Target Date 07/27/21      PT SHORT TERM GOAL #2   Title Lt knee AROM improved 0-105 for improved function    Time 4    Period Weeks    Status On-going    Target Date 07/27/21               PT Long Term Goals - 06/29/21 1513       PT LONG TERM GOAL #1   Title Independent with final HEP    Time 8    Period Weeks    Status New    Target Date 08/24/21      PT LONG TERM GOAL #2   Title Lt knee AROM improved 0-110 for improved function    Time 8    Period Weeks    Status New    Target Date 08/24/21      PT LONG TERM GOAL #3   Title Amb independently without significant deviations for improved function    Time 8    Period Weeks    Status New    Target Date 08/24/21      PT LONG TERM GOAL #4   Title Report pain < 3/10 for improved function    Time 8    Period Weeks    Status New    Target Date 08/24/21      PT LONG TERM GOAL #5   Title FOTO score improved to 60 for improved function    Time 8    Period Weeks    Status New    Target Date 08/24/21                   Plan - 06/30/21 0859     Clinical Impression Statement Pt's session today focused on review of HEP which he needs min cues for as well as continued work on ROM.  He's demonstrated an improvement in 6 deg for flexion since eval yesterday, and is progressing well with PT.  All goals ongoing at this time.  Will continue to  benefit from PT to maximize function.    Personal Factors and Comorbidities Comorbidity 3+    Comorbidities ADD, anxiety, deptression, a-fib    Examination-Activity Limitations Locomotion Level;Transfers;Sit;Sleep;Squat;Stairs;Stand;Lift    Examination-Participation Restrictions  Cleaning;Yard Work;Community Activity;Driving;Shop    Stability/Clinical Decision Making Evolving/Moderate complexity    Rehab Potential Good    PT Frequency 2x / week    PT Duration 8 weeks    PT Treatment/Interventions ADLs/Self Care Home Management;Cryotherapy;Electrical Stimulation;Moist Heat;Balance training;Therapeutic exercise;Therapeutic activities;Functional mobility training;Stair training;Gait training;Neuromuscular re-education;Patient/family education;Scar mobilization;Manual techniques;Passive range of motion;Dry needling;Taping;Vasopneumatic Device    PT Next Visit Plan maximize ROM, quad strengthening, leg press, bike    PT Home Exercise Plan Access Code: [email or web address]    Consulted and Agree with Plan of Care Patient             Patient will benefit from skilled therapeutic intervention in order to improve the following deficits and impairments:  Abnormal gait, Pain, Decreased mobility, Decreased range of motion, Decreased endurance, Decreased strength, Impaired flexibility, Decreased balance  Visit Diagnosis: Acute pain of left knee  Stiffness of left knee, not elsewhere classified  Other abnormalities of gait and mobility  Localized edema     Problem List Patient Active Problem List   Diagnosis Date Noted   Total knee replacement status, 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   Unilateral primary osteoarthritis, left knee    Longstanding persistent atrial fibrillation ([place]) 04/26/2021   Preop cardiovascular exam 04/26/2021   Orthostatic hypotension 02/20/2019   Chronic anticoagulation 03/05/2015   Abnormal chest x-ray with multiple lung nodules 12/14/2014   AS (aortic stenosis) 12/14/2014   Atrial flutter by electrocardiogram ([place]) 12/14/2014   CAD (coronary artery disease) 12/14/2014   DJD (degenerative joint disease) 12/14/2014   History of GI bleed 12/14/2014   Nephrolithiasis 12/14/2014   Other activity(E029.9) 12/14/2014   Prediabetes 12/14/2014   Thrombocytopenia ([place])  12/14/2014   Arterial blood pressure decreased 10/28/2014   Atrial fibrillation ([place]) 09/02/2014   Morbid obesity ([place]) 09/02/2014   AF (paroxysmal atrial fibrillation) ([place]) 08/03/2014   Gastroduodenal ulcer 08/03/2014   Apnea, sleep 08/03/2014   Glaucoma suspect 02/21/2013   Glaucoma suspect of both eyes 02/21/2013   Herpes 04/11/2012   Routine general medical examination at a health care facility 04/11/2012   DYSLIPIDEMIA 05/31/2009   DEPRESSION/ANXIETY 05/31/2009   ATTENTION DEFICIT DISORDER 05/31/2009   Obstructive sleep apnea 05/31/2009   GERD 05/31/2009   GOUT, HX OF 05/31/2009      Laureen Abrahams, PT, DPT 06/30/21 10:36 AM     [facility] Physical Therapy [address] [place], [place], [address] Phone: [phone]   Fax:  [phone]  Name: Francisco Padilla MRN: [id number] Date of Birth: [birth year]

## 2021-07-01 DIAGNOSIS — I482 Chronic atrial fibrillation, unspecified: Secondary | ICD-10-CM | POA: Diagnosis not present

## 2021-07-01 DIAGNOSIS — D6869 Other thrombophilia: Secondary | ICD-10-CM | POA: Diagnosis not present

## 2021-07-01 DIAGNOSIS — K2270 Barrett's esophagus without dysplasia: Secondary | ICD-10-CM | POA: Diagnosis not present

## 2021-07-01 DIAGNOSIS — I10 Essential (primary) hypertension: Secondary | ICD-10-CM | POA: Diagnosis not present

## 2021-07-01 DIAGNOSIS — R634 Abnormal weight loss: Secondary | ICD-10-CM | POA: Diagnosis not present

## 2021-07-01 DIAGNOSIS — F331 Major depressive disorder, recurrent, moderate: Secondary | ICD-10-CM | POA: Diagnosis not present

## 2021-07-04 ENCOUNTER — Ambulatory Visit (INDEPENDENT_AMBULATORY_CARE_PROVIDER_SITE_OTHER): Payer: Medicare Other | Admitting: Physical Therapy

## 2021-07-04 ENCOUNTER — Other Ambulatory Visit: Payer: Self-pay

## 2021-07-04 ENCOUNTER — Encounter: Payer: Self-pay | Admitting: Physical Therapy

## 2021-07-04 DIAGNOSIS — M25562 Pain in left knee: Secondary | ICD-10-CM

## 2021-07-04 DIAGNOSIS — M25662 Stiffness of left knee, not elsewhere classified: Secondary | ICD-10-CM | POA: Diagnosis not present

## 2021-07-04 DIAGNOSIS — R2689 Other abnormalities of gait and mobility: Secondary | ICD-10-CM

## 2021-07-04 DIAGNOSIS — R600 Localized edema: Secondary | ICD-10-CM

## 2021-07-04 NOTE — Therapy (Signed)
[facility] Physical Therapy [address] [place], [place], [address] Phone: [phone]   Fax:  [phone]  Physical Therapy Treatment  Patient Details  Name: Francisco Padilla MRN: [id number] Date of Birth: 01/03/1951 Referring Provider (PT): Newt Minion, MD   Encounter Date: 07/04/2021   PT End of Session - 07/04/21 1540     Visit Number 3    Number of Visits 16    Date for PT Re-Evaluation 08/24/21    Authorization Type Medicare/BCBS    Progress Note Due on Visit 10    PT Start Time 1530    PT Stop Time 1625    PT Time Calculation (min) 55 min    Activity Tolerance Patient tolerated treatment well    Behavior During Therapy [facility] for tasks assessed/performed             Past Medical History:  Diagnosis Date   ADD (attention deficit disorder)    Anxiety 2007   Asthma as child   Asymptomatic gallstones    Atrial fibrillation ([place])    Atrial flutter (HCC)    Depression    DJD (degenerative joint disease) of knee    Dysrhythmia    Chronic Atrial Fib- seeing Dr. Lenna Sciara [facility]   H/O peptic ulcer    past history with GI bleed- cauterization done throught scope   History of kidney stones    pt claims urology discovered a kidney stone last week 05/25/21   Hypercholesterolemia    Orthostatic hypotension    Pneumonia as child   Pneumothorax on left 05/20/2009   Prediabetes    none now   Pulmonary nodules    Sleep apnea    cpap set on 13   Thrombocytopenia ([place])    pt denies   Thyromegaly    Varicose veins     Past Surgical History:  Procedure Laterality Date   BIOPSY  04/18/2018   Procedure: BIOPSY;  Surgeon: Ronnette Juniper, MD;  Location: [facility];  Service: Gastroenterology;;   ESOPHAGOGASTRODUODENOSCOPY N/A 04/18/2018   Procedure: ESOPHAGOGASTRODUODENOSCOPY (EGD);  Surgeon: Ronnette Juniper, MD;  Location: [facility];  Service: Gastroenterology;  Laterality: N/A;   ESOPHAGOGASTRODUODENOSCOPY (EGD) WITH PROPOFOL N/A 01/05/2015    Procedure: ESOPHAGOGASTRODUODENOSCOPY (EGD) WITH PROPOFOL;  Surgeon: Garlan Fair, MD;  Location: [facility];  Service: Endoscopy;  Laterality: N/A;   GASTRIC ROUX-EN-Y     '08-[name] (weight stable around 302 #   KNEE ARTHROSCOPY     TONSILLECTOMY     TOTAL KNEE ARTHROPLASTY Left 06/03/2021   Procedure: LEFT TOTAL KNEE ARTHROPLASTY;  Surgeon: Newt Minion, MD;  Location: [place];  Service: Orthopedics;  Laterality: Left;   ULNAR NERVE TRANSPOSITION Right 15 yrs ago    There were no vitals filed for this visit.   Subjective Assessment - 07/04/21 1530     Subjective He did his exercises but not 100%. He walked a lot.  He saw Dr. Felipa Eth who is not worried about his weight.    Limitations Standing;Walking    Patient Stated Goals improve mobility and pain    Currently in Pain? Yes    Pain Score 4    since last PT lowest 0/10 - highest 7/10   Pain Location Knee    Pain Orientation Left    Pain Descriptors / Indicators Aching;Other (Comment)   jumping at bedtime   Pain Type Surgical pain;Acute pain    Pain Onset More than a month ago    Pain Frequency Intermittent  Aggravating Factors  exercising especially bending knee    Pain Relieving Factors rest, ice, tylenol    Effect of Pain on Daily Activities walking                               [facility] Adult PT Treatment/Exercise - 07/04/21 1530       Exercises   Exercises Knee/Hip      Knee/Hip Exercises: Stretches   Active Hamstring Stretch Left;2 reps;30 seconds    Quad Stretch Left;2 reps;30 seconds    Quad Stretch Limitations supine hooklying strap knee flexion    Gastroc Stretch Left;2 reps;30 seconds    Gastroc Stretch Limitations standing step heel depression      Knee/Hip Exercises: Aerobic   Recumbent Bike SciFit Seat 14 full revolutions level 1 9 min      Knee/Hip Exercises: Seated   Sit to [employer] 1 set;10 reps;with UE support   using seat of chair no armrests     Knee/Hip Exercises: Supine    Quad Sets Left;AROM;1 set;10 reps   5 sec hold   Quad Sets Limitations ankle on towel roll with leg press then quad set      Modalities   Modalities Vasopneumatic      Vasopneumatic   Number Minutes Vasopneumatic  10 minutes    Vasopnuematic Location  Knee    Vasopneumatic Pressure Medium    Vasopneumatic Temperature  34      Manual Therapy   Manual therapy comments seated knee flexion with manual traction & tibial internal rotation                     PT Education - 07/04/21 1618     Education Details updated Access Code: [email or web address] to include muscle stretches    Person(s) Educated Patient    Methods Explanation;Demonstration;Tactile cues;Verbal cues;Handout    Comprehension Verbalized understanding;Returned demonstration;Verbal cues required;Tactile cues required;Need further instruction              PT Short Term Goals - 06/30/21 0859       PT SHORT TERM GOAL #1   Title Independent with initial HEP    Time 4    Period Weeks    Status On-going    Target Date 07/27/21      PT SHORT TERM GOAL #2   Title Lt knee AROM improved 0-105 for improved function    Time 4    Period Weeks    Status On-going    Target Date 07/27/21               PT Long Term Goals - 06/29/21 1513       PT LONG TERM GOAL #1   Title Independent with final HEP    Time 8    Period Weeks    Status New    Target Date 08/24/21      PT LONG TERM GOAL #2   Title Lt knee AROM improved 0-110 for improved function    Time 8    Period Weeks    Status New    Target Date 08/24/21      PT LONG TERM GOAL #3   Title Amb independently without significant deviations for improved function    Time 8    Period Weeks    Status New    Target Date 08/24/21      PT LONG TERM GOAL #4   Title Report pain <  3/10 for improved function    Time 8    Period Weeks    Status New    Target Date 08/24/21      PT LONG TERM GOAL #5   Title FOTO score improved to 60 for improved function     Time 8    Period Weeks    Status New    Target Date 08/24/21                   Plan - 07/04/21 1541     Clinical Impression Statement PT added muscle stretches of gastroc, hamstrings & quads to HEP and he appears to understand. PROM knee flexion improved to 110* today.  He continues to benefit from PT to maximize function.    Personal Factors and Comorbidities Comorbidity 3+    Comorbidities ADD, anxiety, deptression, a-fib    Examination-Activity Limitations Locomotion Level;Transfers;Sit;Sleep;Squat;Stairs;Stand;Lift    Examination-Participation Restrictions Cleaning;Yard Work;Community Activity;Driving;Shop    Stability/Clinical Decision Making Evolving/Moderate complexity    Rehab Potential Good    PT Frequency 2x / week    PT Duration 8 weeks    PT Treatment/Interventions ADLs/Self Care Home Management;Cryotherapy;Electrical Stimulation;Moist Heat;Balance training;Therapeutic exercise;Therapeutic activities;Functional mobility training;Stair training;Gait training;Neuromuscular re-education;Patient/family education;Scar mobilization;Manual techniques;Passive range of motion;Dry needling;Taping;Vasopneumatic Device    PT Next Visit Plan maximize ROM, quad strengthening, leg press, bike    PT Home Exercise Plan Access Code: [email or web address]    Consulted and Agree with Plan of Care Patient             Patient will benefit from skilled therapeutic intervention in order to improve the following deficits and impairments:  Abnormal gait, Pain, Decreased mobility, Decreased range of motion, Decreased endurance, Decreased strength, Impaired flexibility, Decreased balance  Visit Diagnosis: Acute pain of left knee  Stiffness of left knee, not elsewhere classified  Other abnormalities of gait and mobility  Localized edema     Problem List Patient Active Problem List   Diagnosis Date Noted   Total knee replacement status, 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   Unilateral primary osteoarthritis,  left knee    Longstanding persistent atrial fibrillation ([place]) 04/26/2021   Preop cardiovascular exam 04/26/2021   Orthostatic hypotension 02/20/2019   Chronic anticoagulation 03/05/2015   Abnormal chest x-ray with multiple lung nodules 12/14/2014   AS (aortic stenosis) 12/14/2014   Atrial flutter by electrocardiogram ([place]) 12/14/2014   CAD (coronary artery disease) 12/14/2014   DJD (degenerative joint disease) 12/14/2014   History of GI bleed 12/14/2014   Nephrolithiasis 12/14/2014   Other activity(E029.9) 12/14/2014   Prediabetes 12/14/2014   Thrombocytopenia ([place]) 12/14/2014   Arterial blood pressure decreased 10/28/2014   Atrial fibrillation ([place]) 09/02/2014   Morbid obesity ([place]) 09/02/2014   AF (paroxysmal atrial fibrillation) ([place]) 08/03/2014   Gastroduodenal ulcer 08/03/2014   Apnea, sleep 08/03/2014   Glaucoma suspect 02/21/2013   Glaucoma suspect of both eyes 02/21/2013   Herpes 04/11/2012   Routine general medical examination at a health care facility 04/11/2012   DYSLIPIDEMIA 05/31/2009   DEPRESSION/ANXIETY 05/31/2009   ATTENTION DEFICIT DISORDER 05/31/2009   Obstructive sleep apnea 05/31/2009   GERD 05/31/2009   GOUT, HX OF 05/31/2009    Jamey Reas, PT, DPT 07/04/2021, 4:31 PM  [facility] Physical Therapy [address] [place], [place], [address] Phone: [phone]   Fax:  [phone]  Name: Francisco Padilla MRN: [id number] Date of Birth: [birth year]

## 2021-07-04 NOTE — Patient Instructions (Signed)
Access Code: [email or web address] URL: [email or web address] Date: 07/04/2021 Prepared by: Jamey Reas  Exercises Verbal review  Seated Knee Extension AROM - 5-10 x daily - 7 x weekly - 1 sets - 5-10 reps - 5 sec hold Combine next 2 exercises Quad Setting and Stretching - 5-10 x daily - 7 x weekly - 1 sets - 5-10 reps - 5 sec hold Supine Leg Press - 5-10 x daily - 7 x weekly - 1 sets - 5-10 reps - 5 seconds hold  Supine Heel Slide with Strap - 5-10 x daily - 7 x weekly - 1 sets - 5-10 reps Seated Knee Flexion AAROM - 5-10 x daily - 7 x weekly - 1 sets - 5-10 reps - 10 sec hold  Added to HEP Supine Quadriceps Stretch with Strap on Table - 2-3 x daily - 7 x weekly - 1 sets - 2-3 reps - 30 seconds hold Standing Gastroc Stretch on Step with Counter Support - 2-3 x daily - 7 x weekly - 1 sets - 2-3 reps - 30 seconds hold Seated Hamstring Stretch with Strap - 2-3 x daily - 7 x weekly - 1 sets - 2-3 reps - 30 seconds hold

## 2021-07-05 DIAGNOSIS — R1310 Dysphagia, unspecified: Secondary | ICD-10-CM | POA: Diagnosis not present

## 2021-07-05 DIAGNOSIS — K2270 Barrett's esophagus without dysplasia: Secondary | ICD-10-CM | POA: Diagnosis not present

## 2021-07-06 ENCOUNTER — Telehealth: Payer: Self-pay | Admitting: *Deleted

## 2021-07-06 NOTE — Telephone Encounter (Signed)
° °  Pre-operative Risk Assessment    Patient Name: Francisco Padilla  DOB: Dec 08, 1950 MRN: [id number]      Request for Surgical Clearance    Procedure:   ENDOSCOPY  Date of Surgery:  Clearance 10/06/21                                 Surgeon:  DR. [facility] Surgeon's Group or Practice Name:   [facility] Phone number:   [phone] Fax number:   [phone]   Type of Clearance Requested:   - Pharmacy:  Hold Apixaban (Eliquis) NOT MENTIONED ON CLEARANCE HOW MANY DAYS    Type of Anesthesia:   PROPOFOL   Additional requests/questions:      Astrid Divine   07/06/2021, 8:54 AM

## 2021-07-06 NOTE — Telephone Encounter (Signed)
° °  Name: Francisco Padilla  DOB: 11/03/1950  MRN: [id number]   Primary Cardiologist: Candee Furbish, MD  Chart reviewed as part of pre-operative protocol coverage. Patient was contacted 07/06/2021 in reference to pre-operative risk assessment for pending surgery as outlined below.  EVERARDO VORIS was last seen 04/2021 by Dr. Marlou Porch, history reviewed. At that time he was cleared for unrelated surgeries. Reached out to patient and got VM to discuss any new issues. LMTCB.  Charlie Pitter, PA-C 07/06/2021, 3:55 PM

## 2021-07-06 NOTE — Telephone Encounter (Signed)
Clinical pharmacist to review Eliquis 

## 2021-07-06 NOTE — Telephone Encounter (Signed)
Patient with diagnosis of afib on Eliquis for anticoagulation.    Procedure: endoscopy Date of procedure: 10/06/21  CHA2DS2-VASc Score = 2  This indicates a 2.2% annual risk of stroke. The patient's score is based upon: CHF History: 0 HTN History: 0 Diabetes History: 0 Stroke History: 0 Vascular Disease History: 1 Age Score: 1 Gender Score: 0  HTN listed in office notes but BP consistently normal and on no BP meds, will not count.  CrCl >17mL/min Platelet count 188K  Per office protocol, patient can hold Eliquis for 1-2 days prior to procedure.

## 2021-07-07 ENCOUNTER — Other Ambulatory Visit: Payer: Self-pay

## 2021-07-07 ENCOUNTER — Encounter: Payer: Self-pay | Admitting: Physical Therapy

## 2021-07-07 ENCOUNTER — Ambulatory Visit (INDEPENDENT_AMBULATORY_CARE_PROVIDER_SITE_OTHER): Payer: Medicare Other | Admitting: Physical Therapy

## 2021-07-07 DIAGNOSIS — R2689 Other abnormalities of gait and mobility: Secondary | ICD-10-CM | POA: Diagnosis not present

## 2021-07-07 DIAGNOSIS — R600 Localized edema: Secondary | ICD-10-CM

## 2021-07-07 DIAGNOSIS — M25662 Stiffness of left knee, not elsewhere classified: Secondary | ICD-10-CM | POA: Diagnosis not present

## 2021-07-07 DIAGNOSIS — M25562 Pain in left knee: Secondary | ICD-10-CM | POA: Diagnosis not present

## 2021-07-07 NOTE — Telephone Encounter (Signed)
Patient is returning call regarding his clearance.

## 2021-07-07 NOTE — Telephone Encounter (Signed)
° °  Patient Name: Francisco Padilla  DOB: 10/28/1950 MRN: [id number]  Primary Cardiologist: Candee Furbish, MD  Chart reviewed as part of pre-operative protocol coverage. Pt returned call. The patient affirms he has been doing well without any new cardiac symptoms. Therefore, based on ACC/AHA guidelines, the patient would be at acceptable risk for the planned procedure without further cardiovascular testing. The patient was advised that if he develops new symptoms prior to surgery to contact our office to arrange for a follow-up visit, and he verbalized understanding.  Per pharmD, Per office protocol, patient can hold Eliquis for 1-2 days prior to procedure.    Will route this bundled recommendation to requesting provider via Epic fax function. Please call with questions.  Charlie Pitter, PA-C 07/07/2021, 4:34 PM

## 2021-07-07 NOTE — Therapy (Signed)
[facility] Physical Therapy [address] [place], [place], [address] Phone: [phone]   Fax:  [phone]  Physical Therapy Treatment  Patient Details  Name: Francisco Padilla MRN: [id number] Date of Birth: 29-Sep-1950 Referring Provider (PT): Newt Minion, MD   Encounter Date: 07/07/2021   PT End of Session - 07/07/21 0929     Visit Number 4    Number of Visits 16    Date for PT Re-Evaluation 08/24/21    Authorization Type Medicare/BCBS    Progress Note Due on Visit 10    PT Start Time 0845    PT Stop Time 0938    PT Time Calculation (min) 53 min    Activity Tolerance Patient tolerated treatment well    Behavior During Therapy [facility] for tasks assessed/performed             Past Medical History:  Diagnosis Date   ADD (attention deficit disorder)    Anxiety 2007   Asthma as child   Asymptomatic gallstones    Atrial fibrillation ([place])    Atrial flutter (HCC)    Depression    DJD (degenerative joint disease) of knee    Dysrhythmia    Chronic Atrial Fib- seeing Dr. Lenna Sciara [facility]   H/O peptic ulcer    past history with GI bleed- cauterization done throught scope   History of kidney stones    pt claims urology discovered a kidney stone last week 05/25/21   Hypercholesterolemia    Orthostatic hypotension    Pneumonia as child   Pneumothorax on left 05/20/2009   Prediabetes    none now   Pulmonary nodules    Sleep apnea    cpap set on 13   Thrombocytopenia ([place])    pt denies   Thyromegaly    Varicose veins     Past Surgical History:  Procedure Laterality Date   BIOPSY  04/18/2018   Procedure: BIOPSY;  Surgeon: Ronnette Juniper, MD;  Location: [facility];  Service: Gastroenterology;;   ESOPHAGOGASTRODUODENOSCOPY N/A 04/18/2018   Procedure: ESOPHAGOGASTRODUODENOSCOPY (EGD);  Surgeon: Ronnette Juniper, MD;  Location: [facility];  Service: Gastroenterology;  Laterality: N/A;   ESOPHAGOGASTRODUODENOSCOPY (EGD) WITH PROPOFOL N/A 01/05/2015    Procedure: ESOPHAGOGASTRODUODENOSCOPY (EGD) WITH PROPOFOL;  Surgeon: Garlan Fair, MD;  [facility]: [facility];  Service: Endoscopy;  Laterality: N/A;   GASTRIC ROUX-EN-Y     '08-[name] (weight stable around 302 #   KNEE ARTHROSCOPY     TONSILLECTOMY     TOTAL KNEE ARTHROPLASTY Left 06/03/2021   Procedure: LEFT TOTAL KNEE ARTHROPLASTY;  Surgeon: Newt Minion, MD;  Location: [place];  Service: Orthopedics;  Laterality: Left;   ULNAR NERVE TRANSPOSITION Right 15 yrs ago    There were no vitals filed for this visit.   Subjective Assessment - 07/07/21 0845     Subjective He went to [facility] & rode bike which may have been over doing it.    Limitations Standing;Walking    Patient Stated Goals improve mobility and pain    Currently in Pain? Yes    Pain Score 4     Pain Location Knee    Pain Orientation Left    Pain Descriptors / Indicators Aching;Sore    Pain Type Surgical pain;Acute pain    Pain Onset More than a month ago    Pain Frequency Intermittent    Aggravating Factors  exercising especially bending knee    Pain Relieving Factors rest, ice, tylenol    Effect of Pain on  Daily Activities walking                [facility] PT Assessment - 07/07/21 0845       Assessment   Medical Diagnosis Z96.652 (ICD-10-CM) - History of left knee replacement    Referring Provider (PT) Newt Minion, MD    Onset Date/Surgical Date 06/03/21      AROM   Left Knee Flexion 103   supineheel slide     PROM   Left Knee Extension 0    Left Knee Flexion 110   seated                          [facility] Adult PT Treatment/Exercise - 07/07/21 0845       Exercises   Exercises Knee/Hip      Knee/Hip Exercises: Stretches   Active Hamstring Stretch Left;2 reps;30 seconds    Active Hamstring Stretch Limitations seated LE ext w/strap DF & trunk flexion    Quad Stretch Left;2 reps;30 seconds    Quad Stretch Limitations supine hooklying LE over edge with strap knee flexion    Gastroc  Stretch Left;2 reps;30 seconds    Gastroc Stretch Limitations standing step heel depression      Knee/Hip Exercises: Aerobic   Recumbent Bike seat 15 level 2 full revolutions 10 min      Knee/Hip Exercises: Standing   Forward Step Up Both;1 set;5 reps;Hand Hold: 1;Step Height: 6"    Forward Step Up Limitations demo & verbal cues on proper technique    Step Down Both;1 set;5 reps;Hand Hold: 1;Step Height: 6"    Step Down Limitations demo & verbal cues on proper technique      Knee/Hip Exercises: Seated   Sit to Sand 1 set;10 reps;with UE support   using seat of chair no armrests, demo & verbal cues on technique using BLEs     Knee/Hip Exercises: Supine   Quad Sets Left;AROM;1 set;10 reps   5 sec hold   Quad Sets Limitations ankle on towel roll with leg press then quad set    Other Supine Knee/Hip Exercises Elevation A-Z with ankle 1 rep      Modalities   Modalities Vasopneumatic      Vasopneumatic   Number Minutes Vasopneumatic  10 minutes    Vasopnuematic Location  Knee    Vasopneumatic Pressure Medium    Vasopneumatic Temperature  34      Manual Therapy   Manual therapy comments seated knee flexion with manual traction & tibial internal rotation and supine knee ext with femoral int rot                       PT Short Term Goals - 06/30/21 0859       PT SHORT TERM GOAL #1   Title Independent with initial HEP    Time 4    Period Weeks    Status On-going    Target Date 07/27/21      PT SHORT TERM GOAL #2   Title Lt knee AROM improved 0-105 for improved function    Time 4    Period Weeks    Status On-going    Target Date 07/27/21               PT Long Term Goals - 06/29/21 1513       PT LONG TERM GOAL #1   Title Independent with final HEP    Time  8    Period Weeks    Status New    Target Date 08/24/21      PT LONG TERM GOAL #2   Title Lt knee AROM improved 0-110 for improved function    Time 8    Period Weeks    Status New    Target  Date 08/24/21      PT LONG TERM GOAL #3   Title Amb independently without significant deviations for improved function    Time 8    Period Weeks    Status New    Target Date 08/24/21      PT LONG TERM GOAL #4   Title Report pain < 3/10 for improved function    Time 8    Period Weeks    Status New    Target Date 08/24/21      PT LONG TERM GOAL #5   Title FOTO score improved to 60 for improved function    Time 8    Period Weeks    Status New    Target Date 08/24/21                   Plan - 07/07/21 0929     Clinical Impression Statement PT introduced proper technique for step ups & step downs which he improved with instruction & repetition.  His knee mobility is improving with skilled PT.    Personal Factors and Comorbidities Comorbidity 3+    Comorbidities ADD, anxiety, deptression, a-fib    Examination-Activity Limitations Locomotion Level;Transfers;Sit;Sleep;Squat;Stairs;Stand;Lift    Examination-Participation Restrictions Cleaning;Yard Work;Community Activity;Driving;Shop    Stability/Clinical Decision Making Evolving/Moderate complexity    Rehab Potential Good    PT Frequency 2x / week    PT Duration 8 weeks    PT Treatment/Interventions ADLs/Self Care Home Management;Cryotherapy;Electrical Stimulation;Moist Heat;Balance training;Therapeutic exercise;Therapeutic activities;Functional mobility training;Stair training;Gait training;Neuromuscular re-education;Patient/family education;Scar mobilization;Manual techniques;Passive range of motion;Dry needling;Taping;Vasopneumatic Device    PT Next Visit Plan maximize ROM, quad strengthening, leg press, bike    PT Home Exercise Plan Access Code: [email or web address]    Consulted and Agree with Plan of Care Patient             Patient will benefit from skilled therapeutic intervention in order to improve the following deficits and impairments:  Abnormal gait, Pain, Decreased mobility, Decreased range of motion, Decreased  endurance, Decreased strength, Impaired flexibility, Decreased balance  Visit Diagnosis: Acute pain of left knee  Stiffness of left knee, not elsewhere classified  Other abnormalities of gait and mobility  Localized edema     Problem List Patient Active Problem List   Diagnosis Date Noted   Total knee replacement status, 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   Unilateral primary osteoarthritis, left knee    Longstanding persistent atrial fibrillation ([place]) 04/26/2021   Preop cardiovascular exam 04/26/2021   Orthostatic hypotension 02/20/2019   Chronic anticoagulation 03/05/2015   Abnormal chest x-ray with multiple lung nodules 12/14/2014   AS (aortic stenosis) 12/14/2014   Atrial flutter by electrocardiogram ([place]) 12/14/2014   CAD (coronary artery disease) 12/14/2014   DJD (degenerative joint disease) 12/14/2014   History of GI bleed 12/14/2014   Nephrolithiasis 12/14/2014   Other activity(E029.9) 12/14/2014   Prediabetes 12/14/2014   Thrombocytopenia ([place]) 12/14/2014   Arterial blood pressure decreased 10/28/2014   Atrial fibrillation ([place]) 09/02/2014   Morbid obesity ([place]) 09/02/2014   AF (paroxysmal atrial fibrillation) ([place]) 08/03/2014   Gastroduodenal ulcer 08/03/2014   Apnea, sleep 08/03/2014   Glaucoma suspect 02/21/2013   Glaucoma suspect  of both eyes 02/21/2013   Herpes 04/11/2012   Routine general medical examination at a health care facility 04/11/2012   DYSLIPIDEMIA 05/31/2009   DEPRESSION/ANXIETY 05/31/2009   ATTENTION DEFICIT DISORDER 05/31/2009   Obstructive sleep apnea 05/31/2009   GERD 05/31/2009   GOUT, HX OF 05/31/2009    Jamey Reas, PT, DPT 07/07/2021, 2:05 PM  [facility] Physical Therapy [address] [place], [place], [address] Phone: [phone]   Fax:  [phone]  Name: Francisco Padilla MRN: [id number] Date of Birth: 1951/03/31

## 2021-07-12 ENCOUNTER — Ambulatory Visit (INDEPENDENT_AMBULATORY_CARE_PROVIDER_SITE_OTHER): Payer: Medicare Other | Admitting: Physical Therapy

## 2021-07-12 ENCOUNTER — Other Ambulatory Visit: Payer: Self-pay

## 2021-07-12 ENCOUNTER — Encounter: Payer: Self-pay | Admitting: Physical Therapy

## 2021-07-12 DIAGNOSIS — M25662 Stiffness of left knee, not elsewhere classified: Secondary | ICD-10-CM | POA: Diagnosis not present

## 2021-07-12 DIAGNOSIS — R600 Localized edema: Secondary | ICD-10-CM

## 2021-07-12 DIAGNOSIS — M25562 Pain in left knee: Secondary | ICD-10-CM

## 2021-07-12 DIAGNOSIS — R2689 Other abnormalities of gait and mobility: Secondary | ICD-10-CM

## 2021-07-12 NOTE — Therapy (Signed)
[facility] [address] [place] [address], [place], [address] Phone: [phone]   Fax:  [phone]  Physical Therapy Treatment  Patient Details  Name: Francisco Padilla MRN: [id number] Date of Birth: 04/08/1951 Referring Provider (PT): Newt Minion, MD   Encounter Date: 07/12/2021   PT End of Session - 07/12/21 1345     Visit Number 5    Number of Visits 16    Date for PT Re-Evaluation 08/24/21    Authorization Type Medicare/BCBS    Progress Note Due on Visit 10    PT Start Time 2703    PT Stop Time 1430    PT Time Calculation (min) 45 min    Activity Tolerance Patient tolerated treatment well    Behavior During Therapy [facility] for tasks assessed/performed             Past Medical History:  Diagnosis Date   ADD (attention deficit disorder)    Anxiety 2007   Asthma as child   Asymptomatic gallstones    Atrial fibrillation (HCC)    Atrial flutter (HCC)    Depression    DJD (degenerative joint disease) of knee    Dysrhythmia    Chronic Atrial Fib- seeing Dr. Lenna Sciara [facility]   H/O peptic ulcer    past history with GI bleed- cauterization done throught scope   History of kidney stones    pt claims urology discovered a kidney stone last week 05/25/21   Hypercholesterolemia    Orthostatic hypotension    Pneumonia as child   Pneumothorax on left 05/20/2009   Prediabetes    none now   Pulmonary nodules    Sleep apnea    cpap set on 13   Thrombocytopenia ([place])    pt denies   Thyromegaly    Varicose veins     Past Surgical History:  Procedure Laterality Date   BIOPSY  04/18/2018   Procedure: BIOPSY;  Surgeon: Ronnette Juniper, MD;  Location: [facility];  Service: Gastroenterology;;   ESOPHAGOGASTRODUODENOSCOPY N/A 04/18/2018   Procedure: ESOPHAGOGASTRODUODENOSCOPY (EGD);  Surgeon: Ronnette Juniper, MD;  Location: [facility];  Service: Gastroenterology;  Laterality: N/A;   ESOPHAGOGASTRODUODENOSCOPY (EGD) WITH PROPOFOL N/A 01/05/2015    Procedure: ESOPHAGOGASTRODUODENOSCOPY (EGD) WITH PROPOFOL;  Surgeon: Garlan Fair, MD;  Location: [facility];  Service: Endoscopy;  Laterality: N/A;   GASTRIC ROUX-EN-Y     '08-[name] (weight stable around 302 #   KNEE ARTHROSCOPY     TONSILLECTOMY     TOTAL KNEE ARTHROPLASTY Left 06/03/2021   Procedure: LEFT TOTAL KNEE ARTHROPLASTY;  Surgeon: Newt Minion, MD;  Location: [place];  Service: Orthopedics;  Laterality: Left;   ULNAR NERVE TRANSPOSITION Right 15 yrs ago    There were no vitals filed for this visit.   Subjective Assessment - 07/12/21 1345     Subjective He has been going to [facility].  He is also doing his exercises.    Limitations Standing;Walking    Patient Stated Goals improve mobility and pain    Currently in Pain? Yes    Pain Score 3    in last week, lowest 0/10 - highest 8/10 (for instance)   Pain Location Knee    Pain Orientation Left    Pain Descriptors / Indicators Aching;Sore    Pain Type Surgical pain    Pain Onset More than a month ago    Pain Frequency Intermittent    Aggravating Factors  bending knee    Pain Relieving Factors ice, resting knee,  Effect of Pain on Daily Activities sleep                               [facility] Adult PT Treatment/Exercise - 07/12/21 0845       Self-Care   Self-Care ADL's    ADL's PT instructed in using pillows in corners of bed to "tent" sheets off feet. pt verbalized understanding.      Neuro Re-ed    Neuro Re-ed Details  foam beam tandem stance 30 sec LLE in front & 30 sec LLE in back.      Exercises   Exercises Knee/Hip      Knee/Hip Exercises: Stretches   Active Hamstring Stretch Left;2 reps;30 seconds    Active Hamstring Stretch Limitations seated LE ext w/strap DF & trunk flexion    Gastroc Stretch Left;2 reps;30 seconds    Gastroc Stretch Limitations standing step heel depression      Knee/Hip Exercises: Aerobic   Recumbent Bike seat 15 level 3 full revolutions 10 min      Knee/Hip  Exercises: Machines for Strengthening   Total Gym Leg Press Shuttle BLEs 125# 10 reps 3 sets      Knee/Hip Exercises: Standing   Heel Raises Both;1 set;10 reps;3 seconds   alt heel raises & toe raises   Heel Raises Limitations //bars for light UE support.    Forward Step Up Both;1 set;5 reps;Hand Hold: 2;Step Height: 6"    Forward Step Up Limitations demo & verbal cues on proper technique    Step Down Both;1 set;5 reps;Step Height: 6";Hand Hold: 2    Step Down Limitations demo & verbal cues on proper technique      Knee/Hip Exercises: Supine   Other Supine Knee/Hip Exercises Elevation A-Z with ankle 1 rep      Modalities   Modalities Vasopneumatic      Vasopneumatic   Number Minutes Vasopneumatic  10 minutes    Vasopnuematic Location  Knee    Vasopneumatic Pressure Medium    Vasopneumatic Temperature  34      Manual Therapy   Manual therapy comments seated knee flexion with manual traction & tibial internal rotation and supine knee ext with femoral int rot                       PT Short Term Goals - 06/30/21 0859       PT SHORT TERM GOAL #1   Title Independent with initial HEP    Time 4    Period Weeks    Status On-going    Target Date 07/27/21      PT SHORT TERM GOAL #2   Title Lt knee AROM improved 0-105 for improved function    Time 4    Period Weeks    Status On-going    Target Date 07/27/21               PT Long Term Goals - 06/29/21 1513       PT LONG TERM GOAL #1   Title Independent with final HEP    Time 8    Period Weeks    Status New    Target Date 08/24/21      PT LONG TERM GOAL #2   Title Lt knee AROM improved 0-110 for improved function    Time 8    Period Weeks    Status New    Target Date 08/24/21  PT LONG TERM GOAL #3   Title Amb independently without significant deviations for improved function    Time 8    Period Weeks    Status New    Target Date 08/24/21      PT LONG TERM GOAL #4   Title Report pain <  3/10 for improved function    Time 8    Period Weeks    Status New    Target Date 08/24/21      PT LONG TERM GOAL #5   Title FOTO score improved to 60 for improved function    Time 8    Period Weeks    Status New    Target Date 08/24/21                   Plan - 07/12/21 1355     Clinical Impression Statement PT used tweezers to remove suture that had come to surface.  His strength appears to be improving. He continues to benefit from skilled PT.    Personal Factors and Comorbidities Comorbidity 3+    Comorbidities ADD, anxiety, deptression, a-fib    Examination-Activity Limitations Locomotion Level;Transfers;Sit;Sleep;Squat;Stairs;Stand;Lift    Examination-Participation Restrictions Cleaning;Yard Work;Community Activity;Driving;Shop    Stability/Clinical Decision Making Evolving/Moderate complexity    Rehab Potential Good    PT Frequency 2x / week    PT Duration 8 weeks    PT Treatment/Interventions ADLs/Self Care Home Management;Cryotherapy;Electrical Stimulation;Moist Heat;Balance training;Therapeutic exercise;Therapeutic activities;Functional mobility training;Stair training;Gait training;Neuromuscular re-education;Patient/family education;Scar mobilization;Manual techniques;Passive range of motion;Dry needling;Taping;Vasopneumatic Device    PT Next Visit Plan check ROM, manual therapy & exercise to increase ROM, quad strengthening, leg press, bike, vaso to end. educate on stairs.    PT Home Exercise Plan Access Code: [email or web address]    Consulted and Agree with Plan of Care Patient             Patient will benefit from skilled therapeutic intervention in order to improve the following deficits and impairments:  Abnormal gait, Pain, Decreased mobility, Decreased range of motion, Decreased endurance, Decreased strength, Impaired flexibility, Decreased balance  Visit Diagnosis: Acute pain of left knee  Stiffness of left knee, not elsewhere classified  Other abnormalities  of gait and mobility  Localized edema     Problem List Patient Active Problem List   Diagnosis Date Noted   Total knee replacement status, 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   Unilateral primary osteoarthritis, left knee    Longstanding persistent atrial fibrillation ([place]) 04/26/2021   Preop cardiovascular exam 04/26/2021   Orthostatic hypotension 02/20/2019   Chronic anticoagulation 03/05/2015   Abnormal chest x-ray with multiple lung nodules 12/14/2014   AS (aortic stenosis) 12/14/2014   Atrial flutter by electrocardiogram ([place]) 12/14/2014   CAD (coronary artery disease) 12/14/2014   DJD (degenerative joint disease) 12/14/2014   History of GI bleed 12/14/2014   Nephrolithiasis 12/14/2014   Other activity(E029.9) 12/14/2014   Prediabetes 12/14/2014   Thrombocytopenia ([place]) 12/14/2014   Arterial blood pressure decreased 10/28/2014   Atrial fibrillation ([place]) 09/02/2014   Morbid obesity ([place]) 09/02/2014   AF (paroxysmal atrial fibrillation) ([place]) 08/03/2014   Gastroduodenal ulcer 08/03/2014   Apnea, sleep 08/03/2014   Glaucoma suspect 02/21/2013   Glaucoma suspect of both eyes 02/21/2013   Herpes 04/11/2012   Routine general medical examination at a health care facility 04/11/2012   DYSLIPIDEMIA 05/31/2009   DEPRESSION/ANXIETY 05/31/2009   ATTENTION DEFICIT DISORDER 05/31/2009   Obstructive sleep apnea 05/31/2009   GERD 05/31/2009   GOUT, HX OF 05/31/2009  Jamey Reas, PT, DPT 07/12/2021, 2:32 PM  [facility] [address] [place], [place], [address] Phone: [phone]   Fax:  [phone]  Name: Francisco Padilla MRN: [id number] Date of Birth: 01/15/1951

## 2021-07-14 ENCOUNTER — Ambulatory Visit (INDEPENDENT_AMBULATORY_CARE_PROVIDER_SITE_OTHER): Payer: Medicare Other | Admitting: Rehabilitative and Restorative Service Providers"

## 2021-07-14 ENCOUNTER — Encounter: Payer: Self-pay | Admitting: Rehabilitative and Restorative Service Providers"

## 2021-07-14 ENCOUNTER — Other Ambulatory Visit: Payer: Self-pay

## 2021-07-14 DIAGNOSIS — F410 Panic disorder [episodic paroxysmal anxiety] without agoraphobia: Secondary | ICD-10-CM | POA: Diagnosis not present

## 2021-07-14 DIAGNOSIS — M25662 Stiffness of left knee, not elsewhere classified: Secondary | ICD-10-CM | POA: Diagnosis not present

## 2021-07-14 DIAGNOSIS — R600 Localized edema: Secondary | ICD-10-CM

## 2021-07-14 DIAGNOSIS — M25562 Pain in left knee: Secondary | ICD-10-CM | POA: Diagnosis not present

## 2021-07-14 DIAGNOSIS — R2689 Other abnormalities of gait and mobility: Secondary | ICD-10-CM | POA: Diagnosis not present

## 2021-07-14 DIAGNOSIS — F411 Generalized anxiety disorder: Secondary | ICD-10-CM | POA: Diagnosis not present

## 2021-07-14 DIAGNOSIS — F332 Major depressive disorder, recurrent severe without psychotic features: Secondary | ICD-10-CM | POA: Diagnosis not present

## 2021-07-14 DIAGNOSIS — F902 Attention-deficit hyperactivity disorder, combined type: Secondary | ICD-10-CM | POA: Diagnosis not present

## 2021-07-14 NOTE — Therapy (Signed)
[facility] [address] [place], [place], [address] Phone: [phone]   Fax:  [phone]  Physical Therapy Treatment  Patient Details  Name: Francisco Padilla MRN: [id number] Date of Birth: 08-19-1950 Referring Provider (PT): Newt Minion, MD   Encounter Date: 07/14/2021   PT End of Session - 07/14/21 1206     Visit Number 6    Number of Visits 16    Date for PT Re-Evaluation 08/24/21    Authorization Type Medicare/BCBS    Progress Note Due on Visit 10    PT Start Time 1140    PT Stop Time 1232    PT Time Calculation (min) 52 min    Activity Tolerance Patient tolerated treatment well    Behavior During Therapy [facility] for tasks assessed/performed             Past Medical History:  Diagnosis Date   ADD (attention deficit disorder)    Anxiety 2007   Asthma as child   Asymptomatic gallstones    Atrial fibrillation ([place])    Atrial flutter (HCC)    Depression    DJD (degenerative joint disease) of knee    Dysrhythmia    Chronic Atrial Fib- seeing Dr. Lenna Sciara [facility]   H/O peptic ulcer    past history with GI bleed- cauterization done throught scope   History of kidney stones    pt claims urology discovered a kidney stone last week 05/25/21   Hypercholesterolemia    Orthostatic hypotension    Pneumonia as child   Pneumothorax on left 05/20/2009   Prediabetes    none now   Pulmonary nodules    Sleep apnea    cpap set on 13   Thrombocytopenia ([place])    pt denies   Thyromegaly    Varicose veins     Past Surgical History:  Procedure Laterality Date   BIOPSY  04/18/2018   Procedure: BIOPSY;  Surgeon: Ronnette Juniper, MD;  Location: [facility];  Service: Gastroenterology;;   ESOPHAGOGASTRODUODENOSCOPY N/A 04/18/2018   Procedure: ESOPHAGOGASTRODUODENOSCOPY (EGD);  Surgeon: Ronnette Juniper, MD;  Location: [facility];  Service: Gastroenterology;  Laterality: N/A;   ESOPHAGOGASTRODUODENOSCOPY (EGD) WITH PROPOFOL N/A 01/05/2015    Procedure: ESOPHAGOGASTRODUODENOSCOPY (EGD) WITH PROPOFOL;  Surgeon: Garlan Fair, MD;  [facility]: [facility];  Service: Endoscopy;  Laterality: N/A;   GASTRIC ROUX-EN-Y     '08-[name] (weight stable around 302 #   KNEE ARTHROSCOPY     TONSILLECTOMY     TOTAL KNEE ARTHROPLASTY Left 06/03/2021   Procedure: LEFT TOTAL KNEE ARTHROPLASTY;  Surgeon: Newt Minion, MD;  Location: [place];  Service: Orthopedics;  Laterality: Left;   ULNAR NERVE TRANSPOSITION Right 15 yrs ago    There were no vitals filed for this visit.   Subjective Assessment - 07/14/21 1151     Subjective Pt. indicated he did stairs at [employer] and noted last night was achy and more trouble.    Limitations Standing;Walking    Patient Stated Goals improve mobility and pain    Currently in Pain? Yes    Pain Score 2     Pain Location Knee    Pain Orientation Left    Pain Descriptors / Indicators Aching;Sore    Pain Onset More than a month ago    Pain Frequency Intermittent    Aggravating Factors  achy after stairs, nighttime pain    Pain Relieving Factors ice, rest  [facility] PT Assessment - 07/14/21 0001       Assessment   Medical Diagnosis Z96.652 (ICD-10-CM) - History of left knee replacement                           [facility] Adult PT Treatment/Exercise - 07/14/21 0001       Knee/Hip Exercises: Stretches   Active Hamstring Stretch Limitations verbal review for HEP    Gastroc Stretch 30 seconds;3 reps;Left   runner stretch on incline board Lt leg posterior     Knee/Hip Exercises: Aerobic   Recumbent Bike seat 15 level 3 full revolutions 10 min      Knee/Hip Exercises: Machines for Strengthening   Total Gym Leg Press bilateral 125 lbs x20, Lt leg 50 lbs 2 x 10      Knee/Hip Exercises: Standing   Forward Step Up Step Height: 4";Left;Hand Hold: 2   2 x 10 c TKE focus on step prior to Rt leg touching     Vasopneumatic   Number Minutes Vasopneumatic  10 minutes     Vasopnuematic Location  Knee    Vasopneumatic Pressure Medium    Vasopneumatic Temperature  34                       PT Short Term Goals - 06/30/21 0859       PT SHORT TERM GOAL #1   Title Independent with initial HEP    Time 4    Period Weeks    Status On-going    Target Date 07/27/21      PT SHORT TERM GOAL #2   Title Lt knee AROM improved 0-105 for improved function    Time 4    Period Weeks    Status On-going    Target Date 07/27/21               PT Long Term Goals - 06/29/21 1513       PT LONG TERM GOAL #1   Title Independent with final HEP    Time 8    Period Weeks    Status New    Target Date 08/24/21      PT LONG TERM GOAL #2   Title Lt knee AROM improved 0-110 for improved function    Time 8    Period Weeks    Status New    Target Date 08/24/21      PT LONG TERM GOAL #3   Title Amb independently without significant deviations for improved function    Time 8    Period Weeks    Status New    Target Date 08/24/21      PT LONG TERM GOAL #4   Title Report pain < 3/10 for improved function    Time 8    Period Weeks    Status New    Target Date 08/24/21      PT LONG TERM GOAL #5   Title FOTO score improved to 60 for improved function    Time 8    Period Weeks    Status New    Target Date 08/24/21                   Plan - 07/14/21 1224     Clinical Impression Statement Fair loading on Lt leg in step up activity.  Continued focus on progressive strengthening and extension improvement.    Personal Factors and Comorbidities Comorbidity  3+    Comorbidities ADD, anxiety, deptression, a-fib    Examination-Activity Limitations Locomotion Level;Transfers;Sit;Sleep;Squat;Stairs;Stand;Lift    Examination-Participation Restrictions Cleaning;Yard Work;Community Activity;Driving;Shop    Stability/Clinical Decision Making Evolving/Moderate complexity    Rehab Potential Good    PT Frequency 2x / week    PT Duration 8 weeks    PT  Treatment/Interventions ADLs/Self Care Home Management;Cryotherapy;Electrical Stimulation;Moist Heat;Balance training;Therapeutic exercise;Therapeutic activities;Functional mobility training;Stair training;Gait training;Neuromuscular re-education;Patient/family education;Scar mobilization;Manual techniques;Passive range of motion;Dry needling;Taping;Vasopneumatic Device    PT Next Visit Plan Progress strengthening and step up toleranc.e    PT Home Exercise Plan Access Code: [email or web address]    Consulted and Agree with Plan of Care Patient             Patient will benefit from skilled therapeutic intervention in order to improve the following deficits and impairments:  Abnormal gait, Pain, Decreased mobility, Decreased range of motion, Decreased endurance, Decreased strength, Impaired flexibility, Decreased balance  Visit Diagnosis: Acute pain of left knee  Stiffness of left knee, not elsewhere classified  Other abnormalities of gait and mobility  Localized edema     Problem List Patient Active Problem List   Diagnosis Date Noted   Total knee replacement status, 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   Unilateral primary osteoarthritis, left knee    Longstanding persistent atrial fibrillation ([place]) 04/26/2021   Preop cardiovascular exam 04/26/2021   Orthostatic hypotension 02/20/2019   Chronic anticoagulation 03/05/2015   Abnormal chest x-ray with multiple lung nodules 12/14/2014   AS (aortic stenosis) 12/14/2014   Atrial flutter by electrocardiogram ([place]) 12/14/2014   CAD (coronary artery disease) 12/14/2014   DJD (degenerative joint disease) 12/14/2014   History of GI bleed 12/14/2014   Nephrolithiasis 12/14/2014   Other activity(E029.9) 12/14/2014   Prediabetes 12/14/2014   Thrombocytopenia ([place]) 12/14/2014   Arterial blood pressure decreased 10/28/2014   Atrial fibrillation ([place]) 09/02/2014   Morbid obesity ([place]) 09/02/2014   AF (paroxysmal atrial fibrillation) ([place]) 08/03/2014   Gastroduodenal  ulcer 08/03/2014   Apnea, sleep 08/03/2014   Glaucoma suspect 02/21/2013   Glaucoma suspect of both eyes 02/21/2013   Herpes 04/11/2012   Routine general medical examination at a health care facility 04/11/2012   DYSLIPIDEMIA 05/31/2009   DEPRESSION/ANXIETY 05/31/2009   ATTENTION DEFICIT DISORDER 05/31/2009   Obstructive sleep apnea 05/31/2009   GERD 05/31/2009   GOUT, HX OF 05/31/2009   Scot Jun, PT, DPT, OCS, ATC 07/14/21  12:28 PM    [facility] [address] [place], [place], [address] Phone: [phone]   Fax:  [phone]  Name: BORUCH MANUELE MRN: [id number] Date of Birth: May 09, 1951

## 2021-07-19 ENCOUNTER — Other Ambulatory Visit: Payer: Self-pay

## 2021-07-19 ENCOUNTER — Encounter: Payer: Self-pay | Admitting: Rehabilitative and Restorative Service Providers"

## 2021-07-19 ENCOUNTER — Ambulatory Visit (INDEPENDENT_AMBULATORY_CARE_PROVIDER_SITE_OTHER): Payer: Medicare Other | Admitting: Rehabilitative and Restorative Service Providers"

## 2021-07-19 VITALS — BP 103/67

## 2021-07-19 DIAGNOSIS — R600 Localized edema: Secondary | ICD-10-CM

## 2021-07-19 DIAGNOSIS — R2689 Other abnormalities of gait and mobility: Secondary | ICD-10-CM

## 2021-07-19 DIAGNOSIS — M25562 Pain in left knee: Secondary | ICD-10-CM | POA: Diagnosis not present

## 2021-07-19 DIAGNOSIS — M25662 Stiffness of left knee, not elsewhere classified: Secondary | ICD-10-CM | POA: Diagnosis not present

## 2021-07-19 NOTE — Therapy (Signed)
[facility] Physical Therapy [address] [place], [place], [address] Phone: [phone]   Fax:  [phone]  Physical Therapy Treatment  Patient Details  Name: Francisco Padilla MRN: [id number] Date of Birth: 27-Oct-1950 Referring Provider (PT): Newt Minion, MD   Encounter Date: 07/19/2021   PT End of Session - 07/19/21 0902     Visit Number 7    Number of Visits 16    Date for PT Re-Evaluation 08/24/21    Authorization Type Medicare/BCBS    Progress Note Due on Visit 10    PT Start Time 0842    PT Stop Time 0935    PT Time Calculation (min) 53 min    Activity Tolerance Patient limited by pain    Behavior During Therapy [facility] for tasks assessed/performed             Past Medical History:  Diagnosis Date   ADD (attention deficit disorder)    Anxiety 2007   Asthma as child   Asymptomatic gallstones    Atrial fibrillation ([place])    Atrial flutter ([place])    Depression    DJD (degenerative joint disease) of knee    Dysrhythmia    Chronic Atrial Fib- seeing Dr. Lenna Sciara [facility]   H/O peptic ulcer    past history with GI bleed- cauterization done throught scope   History of kidney stones    pt claims urology discovered a kidney stone last week 05/25/21   Hypercholesterolemia    Orthostatic hypotension    Pneumonia as child   Pneumothorax on left 05/20/2009   Prediabetes    none now   Pulmonary nodules    Sleep apnea    cpap set on 13   Thrombocytopenia ([place])    pt denies   Thyromegaly    Varicose veins     Past Surgical History:  Procedure Laterality Date   BIOPSY  04/18/2018   Procedure: BIOPSY;  Surgeon: Ronnette Juniper, MD;  Location: [facility];  Service: Gastroenterology;;   ESOPHAGOGASTRODUODENOSCOPY N/A 04/18/2018   Procedure: ESOPHAGOGASTRODUODENOSCOPY (EGD);  Surgeon: Ronnette Juniper, MD;  Location: [facility];  Service: Gastroenterology;  Laterality: N/A;   ESOPHAGOGASTRODUODENOSCOPY (EGD) WITH PROPOFOL N/A 01/05/2015    Procedure: ESOPHAGOGASTRODUODENOSCOPY (EGD) WITH PROPOFOL;  Surgeon: Garlan Fair, MD;  Location: [facility];  Service: Endoscopy;  Laterality: N/A;   GASTRIC ROUX-EN-Y     '08-[name] (weight stable around 302 #   KNEE ARTHROSCOPY     TONSILLECTOMY     TOTAL KNEE ARTHROPLASTY Left 06/03/2021   Procedure: LEFT TOTAL KNEE ARTHROPLASTY;  Surgeon: Newt Minion, MD;  Location: [place];  Service: Orthopedics;  Laterality: Left;   ULNAR NERVE TRANSPOSITION Right 15 yrs ago    Vitals:   07/19/21 0858  BP: 103/67     Subjective Assessment - 07/19/21 0847     Subjective Pt. indicated losing heat and power that led to a very cold house.  Pt. indicated aches increased and had difficulty moving.  Feeling better now and has moved some.  No pain upon arrival today.  Pt. indicated he had a low LBP    Limitations Standing;Walking    Patient Stated Goals improve mobility and pain    Pain Onset More than a month ago                               [facility] Adult PT Treatment/Exercise - 07/19/21 0001  Neuro Re-ed    Neuro Re-ed Details  tandem stance on foam 1 min x 2 bilateral, alt toe tapping slow to treadmill about 8 inch height x 12 bilateral      Knee/Hip Exercises: Stretches   Gastroc Stretch 30 seconds;Both;5 reps   incline board     Knee/Hip Exercises: Aerobic   Recumbent Bike seat 15 level 3 full revolutions 10 min      Knee/Hip Exercises: Machines for Strengthening   Total Gym Leg Press bilateral 100 lbs 2 x 10, Lt leg 37 lbs 2 x 10      Knee/Hip Exercises: Seated   Long Arc Quad Left;2 sets;10 reps    Long Arc Quad Weight 3 lbs.      Vasopneumatic   Number Minutes Vasopneumatic  10 minutes    Vasopnuematic Location  Knee    Vasopneumatic Pressure Medium    Vasopneumatic Temperature  34                       PT Short Term Goals - 07/19/21 0859       PT SHORT TERM GOAL #1   Title Independent with initial HEP    Time 4    Period Weeks     Status Achieved    Target Date 07/27/21      PT SHORT TERM GOAL #2   Title Lt knee AROM improved 0-105 for improved function    Time 4    Period Weeks    Status On-going    Target Date 07/27/21               PT Long Term Goals - 06/29/21 1513       PT LONG TERM GOAL #1   Title Independent with final HEP    Time 8    Period Weeks    Status New    Target Date 08/24/21      PT LONG TERM GOAL #2   Title Lt knee AROM improved 0-110 for improved function    Time 8    Period Weeks    Status New    Target Date 08/24/21      PT LONG TERM GOAL #3   Title Amb independently without significant deviations for improved function    Time 8    Period Weeks    Status New    Target Date 08/24/21      PT LONG TERM GOAL #4   Title Report pain < 3/10 for improved function    Time 8    Period Weeks    Status New    Target Date 08/24/21      PT LONG TERM GOAL #5   Title FOTO score improved to 60 for improved function    Time 8    Period Weeks    Status New    Target Date 08/24/21                   Plan - 07/19/21 0859     Clinical Impression Statement Adjustements made in clinic activity to reduce load/difficulty on select activity due to presentation today.  No specific negatives in movement reported, just general ache and increased difficulty c similar actiivty from last visit.  Plan to resume progression as tolerated.    Personal Factors and Comorbidities Comorbidity 3+    Comorbidities ADD, anxiety, deptression, a-fib    Examination-Activity Limitations Locomotion Level;Transfers;Sit;Sleep;Squat;Stairs;Stand;Lift    Examination-Participation Restrictions Cleaning;Yard Work;Community Activity;Driving;Shop  Stability/Clinical Decision Making Evolving/Moderate complexity    Rehab Potential Good    PT Frequency 2x / week    PT Duration 8 weeks    PT Treatment/Interventions ADLs/Self Care Home Management;Cryotherapy;Electrical Stimulation;Moist Heat;Balance  training;Therapeutic exercise;Therapeutic activities;Functional mobility training;Stair training;Gait training;Neuromuscular re-education;Patient/family education;Scar mobilization;Manual techniques;Passive range of motion;Dry needling;Taping;Vasopneumatic Device    PT Next Visit Plan Resume progression in strength and functional movement as tolerated. Long term goal and objective data asessment    PT Home Exercise Plan Access Code: [email or web address]    Consulted and Agree with Plan of Care Patient             Patient will benefit from skilled therapeutic intervention in order to improve the following deficits and impairments:  Abnormal gait, Pain, Decreased mobility, Decreased range of motion, Decreased endurance, Decreased strength, Impaired flexibility, Decreased balance  Visit Diagnosis: Acute pain of left knee  Stiffness of left knee, not elsewhere classified  Other abnormalities of gait and mobility  Localized edema     Problem List Patient Active Problem List   Diagnosis Date Noted   Total knee replacement status, 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   Unilateral primary osteoarthritis, left knee    Longstanding persistent atrial fibrillation ([place]) 04/26/2021   Preop cardiovascular exam 04/26/2021   Orthostatic hypotension 02/20/2019   Chronic anticoagulation 03/05/2015   Abnormal chest x-ray with multiple lung nodules 12/14/2014   AS (aortic stenosis) 12/14/2014   Atrial flutter by electrocardiogram ([place]) 12/14/2014   CAD (coronary artery disease) 12/14/2014   DJD (degenerative joint disease) 12/14/2014   History of GI bleed 12/14/2014   Nephrolithiasis 12/14/2014   Other activity(E029.9) 12/14/2014   Prediabetes 12/14/2014   Thrombocytopenia ([place]) 12/14/2014   Arterial blood pressure decreased 10/28/2014   Atrial fibrillation ([place]) 09/02/2014   Morbid obesity ([place]) 09/02/2014   AF (paroxysmal atrial fibrillation) ([place]) 08/03/2014   Gastroduodenal ulcer 08/03/2014   Apnea, sleep 08/03/2014    Glaucoma suspect 02/21/2013   Glaucoma suspect of both eyes 02/21/2013   Herpes 04/11/2012   Routine general medical examination at a health care facility 04/11/2012   DYSLIPIDEMIA 05/31/2009   DEPRESSION/ANXIETY 05/31/2009   ATTENTION DEFICIT DISORDER 05/31/2009   Obstructive sleep apnea 05/31/2009   GERD 05/31/2009   GOUT, HX OF 05/31/2009    Scot Jun, PT, DPT, OCS, ATC 07/19/21  9:27 AM    [facility] Physical Therapy [address] [place], [place], [address] Phone: [phone]   Fax:  [phone]  Name: Francisco Padilla MRN: [id number] Date of Birth: Mar 02, 1951

## 2021-07-21 ENCOUNTER — Encounter: Payer: Medicare Other | Admitting: Rehabilitative and Restorative Service Providers"

## 2021-07-27 ENCOUNTER — Ambulatory Visit (INDEPENDENT_AMBULATORY_CARE_PROVIDER_SITE_OTHER): Payer: Medicare Other | Admitting: Physical Therapy

## 2021-07-27 ENCOUNTER — Other Ambulatory Visit: Payer: Self-pay

## 2021-07-27 ENCOUNTER — Encounter: Payer: Self-pay | Admitting: Physical Therapy

## 2021-07-27 DIAGNOSIS — M25662 Stiffness of left knee, not elsewhere classified: Secondary | ICD-10-CM

## 2021-07-27 DIAGNOSIS — M25562 Pain in left knee: Secondary | ICD-10-CM

## 2021-07-27 DIAGNOSIS — N5201 Erectile dysfunction due to arterial insufficiency: Secondary | ICD-10-CM | POA: Diagnosis not present

## 2021-07-27 DIAGNOSIS — R2689 Other abnormalities of gait and mobility: Secondary | ICD-10-CM

## 2021-07-27 DIAGNOSIS — R600 Localized edema: Secondary | ICD-10-CM | POA: Diagnosis not present

## 2021-07-27 DIAGNOSIS — N209 Urinary calculus, unspecified: Secondary | ICD-10-CM | POA: Diagnosis not present

## 2021-07-27 NOTE — Therapy (Signed)
[facility] Physical Therapy [address] [place], [place], [address] Phone: [phone]   Fax:  [phone]  Physical Therapy Treatment  Patient Details  Name: Francisco Padilla MRN: [id number] Date of Birth: 1951/04/06 Referring Provider (PT): Newt Minion, MD   Encounter Date: 07/27/2021   PT End of Session - 07/27/21 1342     Visit Number 8    Number of Visits 16    Date for PT Re-Evaluation 08/24/21    Authorization Type Medicare/BCBS    Progress Note Due on Visit 10    PT Start Time 7096    PT Stop Time 1342    PT Time Calculation (min) 43 min    Activity Tolerance Patient limited by pain    Behavior During Therapy [facility] for tasks assessed/performed             Past Medical History:  Diagnosis Date   ADD (attention deficit disorder)    Anxiety 2007   Asthma as child   Asymptomatic gallstones    Atrial fibrillation ([place])    Atrial flutter ([place])    Depression    DJD (degenerative joint disease) of knee    Dysrhythmia    Chronic Atrial Fib- seeing Dr. Lenna Sciara [facility]   H/O peptic ulcer    past history with GI bleed- cauterization done throught scope   History of kidney stones    pt claims urology discovered a kidney stone last week 05/25/21   Hypercholesterolemia    Orthostatic hypotension    Pneumonia as child   Pneumothorax on left 05/20/2009   Prediabetes    none now   Pulmonary nodules    Sleep apnea    cpap set on 13   Thrombocytopenia ([place])    pt denies   Thyromegaly    Varicose veins     Past Surgical History:  Procedure Laterality Date   BIOPSY  04/18/2018   Procedure: BIOPSY;  Surgeon: Ronnette Juniper, MD;  [facility];  Service: Gastroenterology;;   ESOPHAGOGASTRODUODENOSCOPY N/A 04/18/2018   Procedure: ESOPHAGOGASTRODUODENOSCOPY (EGD);  Surgeon: Ronnette Juniper, MD;  Location: [facility];  Service: Gastroenterology;  Laterality: N/A;   ESOPHAGOGASTRODUODENOSCOPY (EGD) WITH PROPOFOL N/A 01/05/2015   Procedure:  ESOPHAGOGASTRODUODENOSCOPY (EGD) WITH PROPOFOL;  Surgeon: Garlan Fair, MD;  Location: [facility];  Service: Endoscopy;  Laterality: N/A;   GASTRIC ROUX-EN-Y     '08-[name] (weight stable around 302 #   KNEE ARTHROSCOPY     TONSILLECTOMY     TOTAL KNEE ARTHROPLASTY Left 06/03/2021   Procedure: LEFT TOTAL KNEE ARTHROPLASTY;  Surgeon: Newt Minion, MD;  Location: [place];  Service: Orthopedics;  Laterality: Left;   ULNAR NERVE TRANSPOSITION Right 15 yrs ago    There were no vitals filed for this visit.   Subjective Assessment - 07/27/21 1303     Subjective doing well, overall the pain is improving    Limitations Standing;Walking    Patient Stated Goals improve mobility and pain    Currently in Pain? Yes    Pain Score 2     Pain Location Knee    Pain Orientation Left    Pain Descriptors / Indicators Aching;Sore    Pain Type Surgical pain    Pain Onset More than a month ago    Pain Frequency Intermittent    Aggravating Factors  stairs, nighttime pain    Pain Relieving Factors ice, rest                [facility] PT  Assessment - 07/27/21 1322       Assessment   Medical Diagnosis Z96.652 (ICD-10-CM) - History of left knee replacement    Referring Provider (PT) Newt Minion, MD      AROM   Left Knee Extension 0    Left Knee Flexion 117                           [facility] Adult PT Treatment/Exercise - 07/27/21 1305       Knee/Hip Exercises: Aerobic   Recumbent Bike seat 15, L6 x 10 min      Knee/Hip Exercises: Machines for Strengthening   Total Gym Leg Press bil 75# 3x10; then LLE only 37# 3x10      Knee/Hip Exercises: Seated   Long Arc Quad Left;10 reps;3 sets    Long Arc Quad Weight 5 lbs.                       PT Short Term Goals - 07/27/21 1342       PT SHORT TERM GOAL #1   Title Independent with initial HEP    Time 4    Period Weeks    Status Achieved    Target Date 07/27/21      PT SHORT TERM GOAL #2   Title Lt knee AROM  improved 0-105 for improved function    Time 4    Period Weeks    Status Achieved    Target Date 07/27/21               PT Long Term Goals - 07/27/21 1342       PT LONG TERM GOAL #1   Title Independent with final HEP    Time 8    Period Weeks    Status On-going    Target Date 08/24/21      PT LONG TERM GOAL #2   Title Lt knee AROM improved 0-110 for improved function    Time 8    Period Weeks    Status Achieved    Target Date 08/24/21      PT LONG TERM GOAL #3   Title Amb independently without significant deviations for improved function    Time 8    Period Weeks    Status On-going    Target Date 08/24/21      PT LONG TERM GOAL #4   Title Report pain < 3/10 for improved function    Time 8    Period Weeks    Status On-going    Target Date 08/24/21      PT LONG TERM GOAL #5   Title FOTO score improved to 60 for improved function    Time 8    Period Weeks    Status On-going    Target Date 08/24/21                   Plan - 07/27/21 1343     Clinical Impression Statement Pt has met ROM goals at this time and is doing well with PT.  Plan to focus mainly on strengthening/balance and maintain ROM.  Will continue to benefit from PT to maxiimze function.    Personal Factors and Comorbidities Comorbidity 3+    Comorbidities ADD, anxiety, deptression, a-fib    Examination-Activity Limitations Locomotion Level;Transfers;Sit;Sleep;Squat;Stairs;Stand;Lift    Examination-Participation Restrictions Cleaning;Yard Work;Community Activity;Driving;Shop    Stability/Clinical Decision Making Evolving/Moderate complexity    Rehab Potential Good  PT Frequency 2x / week    PT Duration 8 weeks    PT Treatment/Interventions ADLs/Self Care Home Management;Cryotherapy;Electrical Stimulation;Moist Heat;Balance training;Therapeutic exercise;Therapeutic activities;Functional mobility training;Stair training;Gait training;Neuromuscular re-education;Patient/family  education;Scar mobilization;Manual techniques;Passive range of motion;Dry needling;Taping;Vasopneumatic Device    PT Next Visit Plan Resume progression in strength and functional movement as tolerated.  balance exercises, update HEP    PT Home Exercise Plan Access Code: [email or web address]    Consulted and Agree with Plan of Care Patient             Patient will benefit from skilled therapeutic intervention in order to improve the following deficits and impairments:  Abnormal gait, Pain, Decreased mobility, Decreased range of motion, Decreased endurance, Decreased strength, Impaired flexibility, Decreased balance  Visit Diagnosis: Acute pain of left knee  Stiffness of left knee, not elsewhere classified  Other abnormalities of gait and mobility  Localized edema     Problem List Patient Active Problem List   Diagnosis Date Noted   Total knee replacement status, 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   Unilateral primary osteoarthritis, left knee    Longstanding persistent atrial fibrillation ([place]) 04/26/2021   Preop cardiovascular exam 04/26/2021   Orthostatic hypotension 02/20/2019   Chronic anticoagulation 03/05/2015   Abnormal chest x-ray with multiple lung nodules 12/14/2014   AS (aortic stenosis) 12/14/2014   Atrial flutter by electrocardiogram ([place]) 12/14/2014   CAD (coronary artery disease) 12/14/2014   DJD (degenerative joint disease) 12/14/2014   History of GI bleed 12/14/2014   Nephrolithiasis 12/14/2014   Other activity(E029.9) 12/14/2014   Prediabetes 12/14/2014   Thrombocytopenia ([place]) 12/14/2014   Arterial blood pressure decreased 10/28/2014   Atrial fibrillation ([place]) 09/02/2014   Morbid obesity ([place]) 09/02/2014   AF (paroxysmal atrial fibrillation) ([place]) 08/03/2014   Gastroduodenal ulcer 08/03/2014   Apnea, sleep 08/03/2014   Glaucoma suspect 02/21/2013   Glaucoma suspect of both eyes 02/21/2013   Herpes 04/11/2012   Routine general medical examination at a health care facility  04/11/2012   DYSLIPIDEMIA 05/31/2009   DEPRESSION/ANXIETY 05/31/2009   ATTENTION DEFICIT DISORDER 05/31/2009   Obstructive sleep apnea 05/31/2009   GERD 05/31/2009   GOUT, HX OF 05/31/2009      Laureen Abrahams, PT, DPT 07/27/21 1:44 PM    [facility] Physical [facility] [address] [place], [place], [address] Phone: [phone]   Fax:  [phone]  Name: Francisco Padilla MRN: [id number] Date of Birth: 19-Aug-1950

## 2021-07-28 ENCOUNTER — Telehealth: Payer: Self-pay | Admitting: Cardiology

## 2021-07-28 ENCOUNTER — Ambulatory Visit (INDEPENDENT_AMBULATORY_CARE_PROVIDER_SITE_OTHER): Payer: Medicare Other | Admitting: Orthopedic Surgery

## 2021-07-28 ENCOUNTER — Other Ambulatory Visit: Payer: Self-pay | Admitting: Urology

## 2021-07-28 ENCOUNTER — Encounter: Payer: Medicare Other | Admitting: Orthopedic Surgery

## 2021-07-28 DIAGNOSIS — Z96652 Presence of left artificial knee joint: Secondary | ICD-10-CM

## 2021-07-28 NOTE — Telephone Encounter (Signed)
° °  Pre-operative Risk Assessment    Patient Name: Francisco Padilla  DOB: [birth year] MRN: [id number]      Request for Surgical Clearance    Procedure:   Lithotripsy  Date of Surgery:  Clearance 08/11/21                                 Surgeon:  Dr. Rexene Alberts Surgeon's Group or Practice Name:  [facility] Phone number:  [phone] Fax number:  [phone]   Type of Clearance Requested:   - Pharmacy:  Hold Apixaban (Eliquis) Need to hold Eliquis 48  hours prior.    Type of Anesthesia:  Local    Additional requests/questions:    Marquita Palms   07/28/2021, 3:32 PM

## 2021-07-28 NOTE — Telephone Encounter (Signed)
Patient with diagnosis of afib on Eliquis for anticoagulation.    Procedure: lithotripsy Date of procedure: 08/11/21  CHA2DS2-VASc Score = 2  This indicates a 2.2% annual risk of stroke. The patient's score is based upon: CHF History: 0 HTN History: 0 Diabetes History: 0 Stroke History: 0 Vascular Disease History: 1 Age Score: 1 Gender Score: 0  HTN listed in office notes but BP consistently normal and on no BP meds, will not count  CrCl >141mL/min Platelet count 188K  Per office protocol, patient can hold Eliquis for 2 days prior to procedure as requested.

## 2021-07-28 NOTE — Telephone Encounter (Signed)
Will route to pharm re: anticoag then pt will need call. Last OV 04/2021. Cleared for unrelated surgery via phone in 06/2021.

## 2021-07-29 ENCOUNTER — Other Ambulatory Visit: Payer: Self-pay

## 2021-07-29 ENCOUNTER — Encounter: Payer: Self-pay | Admitting: Physical Therapy

## 2021-07-29 ENCOUNTER — Ambulatory Visit (INDEPENDENT_AMBULATORY_CARE_PROVIDER_SITE_OTHER): Payer: Medicare Other | Admitting: Physical Therapy

## 2021-07-29 DIAGNOSIS — M25662 Stiffness of left knee, not elsewhere classified: Secondary | ICD-10-CM | POA: Diagnosis not present

## 2021-07-29 DIAGNOSIS — R600 Localized edema: Secondary | ICD-10-CM | POA: Diagnosis not present

## 2021-07-29 DIAGNOSIS — M25562 Pain in left knee: Secondary | ICD-10-CM

## 2021-07-29 DIAGNOSIS — R2689 Other abnormalities of gait and mobility: Secondary | ICD-10-CM | POA: Diagnosis not present

## 2021-07-29 NOTE — Therapy (Signed)
[facility] Physical Therapy [address] [place], [place], [address] Phone: [phone]   Fax:  [phone]  Physical Therapy Treatment  Patient Details  Name: Francisco Padilla MRN: [id number] Date of Birth: Mar 21, 1951 Referring Provider (PT): Newt Minion, MD   Encounter Date: 07/29/2021   PT End of Session - 07/29/21 1142     Visit Number 9    Number of Visits 16    Date for PT Re-Evaluation 08/24/21    Authorization Type Medicare/BCBS    Progress Note Due on Visit 10    PT Start Time 1100    PT Stop Time 1140    PT Time Calculation (min) 40 min    Activity Tolerance Patient limited by pain    Behavior During Therapy [facility] for tasks assessed/performed             Past Medical History:  Diagnosis Date   ADD (attention deficit disorder)    Anxiety 2007   Asthma as child   Asymptomatic gallstones    Atrial fibrillation (HCC)    Atrial flutter (HCC)    Depression    DJD (degenerative joint disease) of knee    Dysrhythmia    Chronic Atrial Fib- seeing Dr. Lenna Sciara [facility]   H/O peptic ulcer    past history with GI bleed- cauterization done throught scope   History of kidney stones    pt claims urology discovered a kidney stone last week 05/25/21   Hypercholesterolemia    Orthostatic hypotension    Pneumonia as child   Pneumothorax on left 05/20/2009   Prediabetes    none now   Pulmonary nodules    Sleep apnea    cpap set on 13   Thrombocytopenia ([place])    pt denies   Thyromegaly    Varicose veins     Past Surgical History:  Procedure Laterality Date   BIOPSY  04/18/2018   Procedure: BIOPSY;  Surgeon: Ronnette Juniper, MD;  Location: [facility];  Service: Gastroenterology;;   ESOPHAGOGASTRODUODENOSCOPY N/A 04/18/2018   Procedure: ESOPHAGOGASTRODUODENOSCOPY (EGD);  Surgeon: Ronnette Juniper, MD;  Location: [facility];  Service: Gastroenterology;  Laterality: N/A;   ESOPHAGOGASTRODUODENOSCOPY (EGD) WITH PROPOFOL N/A 01/05/2015   Procedure:  ESOPHAGOGASTRODUODENOSCOPY (EGD) WITH PROPOFOL;  Surgeon: Garlan Fair, MD;  Location: [facility];  Service: Endoscopy;  Laterality: N/A;   GASTRIC ROUX-EN-Y     '08-[name] (weight stable around 302 #   KNEE ARTHROSCOPY     TONSILLECTOMY     TOTAL KNEE ARTHROPLASTY Left 06/03/2021   Procedure: LEFT TOTAL KNEE ARTHROPLASTY;  Surgeon: Newt Minion, MD;  Location: [place];  Service: Orthopedics;  Laterality: Left;   ULNAR NERVE TRANSPOSITION Right 15 yrs ago    There were no vitals filed for this visit.   Subjective Assessment - 07/29/21 1103     Subjective MD visit went well - he's pleased with progress.    Limitations Standing;Walking    Patient Stated Goals improve mobility and pain    Currently in Pain? Yes    Pain Score 3     Pain Location Knee    Pain Orientation Left    Pain Descriptors / Indicators Sore    Pain Type Surgical pain    Pain Onset More than a month ago    Pain Frequency Intermittent    Aggravating Factors  stairs, sore after exercise    Pain Relieving Factors ice, rest  [facility] Adult PT Treatment/Exercise - 07/29/21 1106       Knee/Hip Exercises: Aerobic   Recumbent Bike seat 15, L6 x 10 min      Knee/Hip Exercises: Machines for Strengthening   Total Gym Leg Press bil 75# 3x15; then LLE only 37# 2x15                 Balance Exercises - 07/29/21 1132       Balance Exercises: Standing   Tandem Stance Eyes open;Upper extremity support 1;30 secs    SLS Eyes open;Foam/compliant surface;5 reps;10 secs   LLE   Other Standing Exercises side stepping at angles with min/mod cues for sequencing and technique                  PT Short Term Goals - 07/27/21 1342       PT SHORT TERM GOAL #1   Title Independent with initial HEP    Time 4    Period Weeks    Status Achieved    Target Date 07/27/21      PT SHORT TERM GOAL #2   Title Lt knee AROM improved 0-105 for improved function    Time  4    Period Weeks    Status Achieved    Target Date 07/27/21               PT Long Term Goals - 07/27/21 1342       PT LONG TERM GOAL #1   Title Independent with final HEP    Time 8    Period Weeks    Status On-going    Target Date 08/24/21      PT LONG TERM GOAL #2   Title Lt knee AROM improved 0-110 for improved function    Time 8    Period Weeks    Status Achieved    Target Date 08/24/21      PT LONG TERM GOAL #3   Title Amb independently without significant deviations for improved function    Time 8    Period Weeks    Status On-going    Target Date 08/24/21      PT LONG TERM GOAL #4   Title Report pain < 3/10 for improved function    Time 8    Period Weeks    Status On-going    Target Date 08/24/21      PT LONG TERM GOAL #5   Title FOTO score improved to 60 for improved function    Time 8    Period Weeks    Status On-going    Target Date 08/24/21                   Plan - 07/29/21 1142     Clinical Impression Statement Pt tolerated session well today with noticable difficulty with balance activities.  Will continue to benefit from PT to maximize function.  Plan to continue with strengthening and balance activities.    Personal Factors and Comorbidities Comorbidity 3+    Comorbidities ADD, anxiety, deptression, a-fib    Examination-Activity Limitations Locomotion Level;Transfers;Sit;Sleep;Squat;Stairs;Stand;Lift    Examination-Participation Restrictions Cleaning;Yard Work;Community Activity;Driving;Shop    Stability/Clinical Decision Making Evolving/Moderate complexity    Rehab Potential Good    PT Frequency 2x / week    PT Duration 8 weeks    PT Treatment/Interventions ADLs/Self Care Home Management;Cryotherapy;Electrical Stimulation;Moist Heat;Balance training;Therapeutic exercise;Therapeutic activities;Functional mobility training;Stair training;Gait training;Neuromuscular re-education;Patient/family education;Scar mobilization;Manual  techniques;Passive range of motion;Dry needling;Taping;Vasopneumatic Device  PT Next Visit Plan Resume progression in strength and functional movement as tolerated.  balance exercises, update HEP, 10th visit PN    PT Home Exercise Plan Access Code: [email or web address]    Consulted and Agree with Plan of Care Patient             Patient will benefit from skilled therapeutic intervention in order to improve the following deficits and impairments:  Abnormal gait, Pain, Decreased mobility, Decreased range of motion, Decreased endurance, Decreased strength, Impaired flexibility, Decreased balance  Visit Diagnosis: Acute pain of left knee  Stiffness of left knee, not elsewhere classified  Other abnormalities of gait and mobility  Localized edema     Problem List Patient Active Problem List   Diagnosis Date Noted   Total knee replacement status, 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   Unilateral primary osteoarthritis, left knee    Longstanding persistent atrial fibrillation ([place]) 04/26/2021   Preop cardiovascular exam 04/26/2021   Orthostatic hypotension 02/20/2019   Chronic anticoagulation 03/05/2015   Abnormal chest x-ray with multiple lung nodules 12/14/2014   AS (aortic stenosis) 12/14/2014   Atrial flutter by electrocardiogram ([place]) 12/14/2014   CAD (coronary artery disease) 12/14/2014   DJD (degenerative joint disease) 12/14/2014   History of GI bleed 12/14/2014   Nephrolithiasis 12/14/2014   Other activity(E029.9) 12/14/2014   Prediabetes 12/14/2014   Thrombocytopenia ([place]) 12/14/2014   Arterial blood pressure decreased 10/28/2014   Atrial fibrillation ([place]) 09/02/2014   Morbid obesity ([place]) 09/02/2014   AF (paroxysmal atrial fibrillation) ([place]) 08/03/2014   Gastroduodenal ulcer 08/03/2014   Apnea, sleep 08/03/2014   Glaucoma suspect 02/21/2013   Glaucoma suspect of both eyes 02/21/2013   Herpes 04/11/2012   Routine general medical examination at a health care facility 04/11/2012    DYSLIPIDEMIA 05/31/2009   DEPRESSION/ANXIETY 05/31/2009   ATTENTION DEFICIT DISORDER 05/31/2009   Obstructive sleep apnea 05/31/2009   GERD 05/31/2009   GOUT, HX OF 05/31/2009      Laureen Abrahams, PT, DPT 07/29/21 11:44 AM     [facility] Physical Therapy [address] [place], [place], [address] Phone: [phone]   Fax:  [phone]  Name: TAYLOR LEVICK MRN: [id number] Date of Birth: [birth year]

## 2021-07-29 NOTE — Telephone Encounter (Signed)
° °  Primary Cardiologist: Candee Furbish, MD  Clinical pharmacist have reviewed past medical history and medications for Francisco Padilla.  The following recommendations have been made.    Patient with diagnosis of afib on Eliquis for anticoagulation.     Procedure: lithotripsy Date of procedure: 08/11/21   CHA2DS2-VASc Score = 2  This indicates a 2.2% annual risk of stroke. The patient's score is based upon: CHF History: 0 HTN History: 0 Diabetes History: 0 Stroke History: 0 Vascular Disease History: 1 Age Score: 1 Gender Score: 0  HTN listed in office notes but BP consistently normal and on no BP meds, will not count   CrCl >117mL/min Platelet count 188K   Per office protocol, patient can hold Eliquis for 2 days prior to procedure as requested.  I will route this recommendation to the requesting party via Epic fax function and remove from pre-op pool.  Please call with questions.  Francisco Padilla. Luretha Eberly NP-C    07/29/2021, 10:44 AM [address] Office [phone] Fax [phone]

## 2021-08-01 ENCOUNTER — Ambulatory Visit (INDEPENDENT_AMBULATORY_CARE_PROVIDER_SITE_OTHER): Payer: Medicare Other | Admitting: Physical Therapy

## 2021-08-01 ENCOUNTER — Encounter: Payer: Self-pay | Admitting: Physical Therapy

## 2021-08-01 ENCOUNTER — Other Ambulatory Visit: Payer: Self-pay

## 2021-08-01 DIAGNOSIS — M25662 Stiffness of left knee, not elsewhere classified: Secondary | ICD-10-CM | POA: Diagnosis not present

## 2021-08-01 DIAGNOSIS — R600 Localized edema: Secondary | ICD-10-CM

## 2021-08-01 DIAGNOSIS — M25562 Pain in left knee: Secondary | ICD-10-CM | POA: Diagnosis not present

## 2021-08-01 DIAGNOSIS — R2689 Other abnormalities of gait and mobility: Secondary | ICD-10-CM | POA: Diagnosis not present

## 2021-08-01 NOTE — Therapy (Signed)
[place], [place], [address] Phone: [phone]   Fax:  [phone]  Physical Therapy Treatment & 10th Visit Progress Note  Patient Details  Name: Francisco Padilla MRN: [id number] Date of Birth: 1951-05-05 Referring Provider (PT): Newt Minion, MD   Encounter Date: 08/01/2021  Progress Note Reporting Period 06/29/2021 to 08/01/2021  See note below for Objective Data and Assessment of Progress/Goals.       PT End of Session - 08/01/21 1306     Visit Number 10    Number of Visits 16    Date for PT Re-Evaluation 08/24/21    Authorization Type Medicare/BCBS    Progress Note Due on Visit 10    PT Start Time 1300    PT Stop Time 1341    PT Time Calculation (min) 41 min    Activity Tolerance Patient limited by pain    Behavior During Therapy [facility] for tasks assessed/performed             Past Medical History:  Diagnosis Date   ADD (attention deficit disorder)    Anxiety 2007   Asthma as child   Asymptomatic gallstones    Atrial fibrillation (HCC)    Atrial flutter (HCC)    Depression    DJD (degenerative joint disease) of knee    Dysrhythmia    Chronic Atrial Fib- seeing Dr. Lenna Sciara [facility]   H/O peptic ulcer    past history with GI bleed- cauterization done throught scope   History of kidney stones    pt claims urology discovered a kidney stone last week 05/25/21   Hypercholesterolemia    Orthostatic hypotension    Pneumonia as child   Pneumothorax on left 05/20/2009   Prediabetes    none now   Pulmonary nodules    Sleep apnea    cpap set on 13   Thrombocytopenia ([place])    pt denies   Thyromegaly    Varicose veins     Past Surgical History:  Procedure Laterality Date   BIOPSY  04/18/2018   Procedure: BIOPSY;  Surgeon: Ronnette Juniper, MD;  [facility]: [facility];  Service: Gastroenterology;;   ESOPHAGOGASTRODUODENOSCOPY N/A 04/18/2018   Procedure: ESOPHAGOGASTRODUODENOSCOPY (EGD);  Surgeon:  Ronnette Juniper, MD;  [facility];  Service: Gastroenterology;  Laterality: N/A;   ESOPHAGOGASTRODUODENOSCOPY (EGD) WITH PROPOFOL N/A 01/05/2015   Procedure: ESOPHAGOGASTRODUODENOSCOPY (EGD) WITH PROPOFOL;  Surgeon: Garlan Fair, MD;  [facility];  Service: Endoscopy;  Laterality: N/A;   GASTRIC ROUX-EN-Y     '08-[name] (weight stable around 302 #   KNEE ARTHROSCOPY     TONSILLECTOMY     TOTAL KNEE ARTHROPLASTY Left 06/03/2021   Procedure: LEFT TOTAL KNEE ARTHROPLASTY;  Surgeon: Newt Minion, MD;  Location: [facility];  Service: Orthopedics;  Laterality: Left;   ULNAR NERVE TRANSPOSITION Right 15 yrs ago    There were no vitals filed for this visit.   Subjective Assessment - 08/01/21 1300     Subjective He went to [facility] yesterday.  He added leg press to routine and did 80# 15reps 3 sets without issues.    Limitations Standing;Walking    Patient Stated Goals improve mobility and pain    Currently in Pain? Yes    Pain Score 2     Pain Location Knee    Pain Orientation Left    Pain Descriptors / Indicators Aching    Pain Onset More than a month ago    Pain Frequency Intermittent  Aggravating Factors  cold weather    Pain Relieving Factors tylenol                [facility] PT Assessment - 08/01/21 0001       Assessment   Medical Diagnosis Z96.652 (ICD-10-CM) - History of left knee replacement    Referring Provider (PT) Newt Minion, MD      PROM   Left Knee Flexion 124                           [facility] Adult PT Treatment/Exercise - 08/01/21 1300       Ambulation/Gait   Stairs Yes    Stairs Assistance 5: Supervision    Stairs Assistance Details (indicate cue type and reason) demo & verbal cues on technique    Stair Management Technique One rail Right;Alternating pattern;Forwards    Number of Stairs 11    Height of Stairs 7      Neuro Re-ed    Neuro Re-ed Details  tandem stance on foam 1 min bilateral; LLE on foam Y reaching RLE 5 reps  ea.      Knee/Hip Exercises: Aerobic   Recumbent Bike seat 15, L6 x 10 min      Knee/Hip Exercises: Machines for Strengthening   Total Gym Leg Press bil 81# 3x15; then LLE only 43# 2x15                       PT Short Term Goals - 07/27/21 1342       PT SHORT TERM GOAL #1   Title Independent with initial HEP    Time 4    Period Weeks    Status Achieved    Target Date 07/27/21      PT SHORT TERM GOAL #2   Title Lt knee AROM improved 0-105 for improved function    Time 4    Period Weeks    Status Achieved    Target Date 07/27/21               PT Long Term Goals - 07/27/21 1342       PT LONG TERM GOAL #1   Title Independent with final HEP    Time 8    Period Weeks    Status On-going    Target Date 08/24/21      PT LONG TERM GOAL #2   Title Lt knee AROM improved 0-110 for improved function    Time 8    Period Weeks    Status Achieved    Target Date 08/24/21      PT LONG TERM GOAL #3   Title Amb independently without significant deviations for improved function    Time 8    Period Weeks    Status On-going    Target Date 08/24/21      PT LONG TERM GOAL #4   Title Report pain < 3/10 for improved function    Time 8    Period Weeks    Status On-going    Target Date 08/24/21      PT LONG TERM GOAL #5   Title FOTO score improved to 60 for improved function    Time 8    Period Weeks    Status On-going    Target Date 08/24/21                   Plan - 08/01/21 1306  Clinical Impression Statement 10visit Pt. has attended 10 visits overall during course of treatment.  See objective data for updated information.  Pt. has made good gains to this point c mild defiicts noted in Rt knee AROM and movement coordination.  Pt. may continue to benefit from skilled PT services to continue progression towards reaching established goals and reduced difficulty due to presentation.    Personal Factors and Comorbidities Comorbidity 3+     Comorbidities ADD, anxiety, deptression, a-fib    Examination-Activity Limitations Locomotion Level;Transfers;Sit;Sleep;Squat;Stairs;Stand;Lift    Examination-Participation Restrictions Cleaning;Yard Work;Community Activity;Driving;Shop    Stability/Clinical Decision Making Evolving/Moderate complexity    Rehab Potential Good    PT Frequency 2x / week    PT Duration 8 weeks    PT Treatment/Interventions ADLs/Self Care Home Management;Cryotherapy;Electrical Stimulation;Moist Heat;Balance training;Therapeutic exercise;Therapeutic activities;Functional mobility training;Stair training;Gait training;Neuromuscular re-education;Patient/family education;Scar mobilization;Manual techniques;Passive range of motion;Dry needling;Taping;Vasopneumatic Device    PT Next Visit Plan Resume progression in strength and functional movement as tolerated.  balance exercises, update HE    PT Home Exercise Plan Access Code: [email or web address]    Consulted and Agree with Plan of Care Patient             Patient will benefit from skilled therapeutic intervention in order to improve the following deficits and impairments:  Abnormal gait, Pain, Decreased mobility, Decreased range of motion, Decreased endurance, Decreased strength, Impaired flexibility, Decreased balance  Visit Diagnosis: Acute pain of left knee  Stiffness of left knee, not elsewhere classified  Other abnormalities of gait and mobility  Localized edema     Problem List Patient Active Problem List   Diagnosis Date Noted   Total knee replacement status, 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   Unilateral primary osteoarthritis, left knee    Longstanding persistent atrial fibrillation ([place]) 04/26/2021   Preop cardiovascular exam 04/26/2021   Orthostatic hypotension 02/20/2019   Chronic anticoagulation 03/05/2015   Abnormal chest x-ray with multiple lung nodules 12/14/2014   AS (aortic stenosis) 12/14/2014   Atrial flutter by electrocardiogram ([place]) 12/14/2014   CAD  (coronary artery disease) 12/14/2014   DJD (degenerative joint disease) 12/14/2014   History of GI bleed 12/14/2014   Nephrolithiasis 12/14/2014   Other activity(E029.9) 12/14/2014   Prediabetes 12/14/2014   Thrombocytopenia ([place]) 12/14/2014   Arterial blood pressure decreased 10/28/2014   Atrial fibrillation ([place]) 09/02/2014   Morbid obesity ([place]) 09/02/2014   AF (paroxysmal atrial fibrillation) ([place]) 08/03/2014   Gastroduodenal ulcer 08/03/2014   Apnea, sleep 08/03/2014   Glaucoma suspect 02/21/2013   Glaucoma suspect of both eyes 02/21/2013   Herpes 04/11/2012   Routine general medical examination at a health care facility 04/11/2012   DYSLIPIDEMIA 05/31/2009   DEPRESSION/ANXIETY 05/31/2009   ATTENTION DEFICIT DISORDER 05/31/2009   Obstructive sleep apnea 05/31/2009   GERD 05/31/2009   GOUT, HX OF 05/31/2009    Jamey Reas, PT, DPT 08/01/2021, 1:56 PM  [facility] Physical Therapy [address] [place], [place], [address] Phone: [phone]   Fax:  [phone]  Name: SAIR FAULCON MRN: [id number] Date of Birth: [birth year]

## 2021-08-04 ENCOUNTER — Encounter: Payer: Self-pay | Admitting: Physical Therapy

## 2021-08-04 ENCOUNTER — Ambulatory Visit (INDEPENDENT_AMBULATORY_CARE_PROVIDER_SITE_OTHER): Payer: Medicare Other | Admitting: Physical Therapy

## 2021-08-04 ENCOUNTER — Other Ambulatory Visit: Payer: Self-pay

## 2021-08-04 DIAGNOSIS — F410 Panic disorder [episodic paroxysmal anxiety] without agoraphobia: Secondary | ICD-10-CM | POA: Diagnosis not present

## 2021-08-04 DIAGNOSIS — F902 Attention-deficit hyperactivity disorder, combined type: Secondary | ICD-10-CM | POA: Diagnosis not present

## 2021-08-04 DIAGNOSIS — R2689 Other abnormalities of gait and mobility: Secondary | ICD-10-CM | POA: Diagnosis not present

## 2021-08-04 DIAGNOSIS — F332 Major depressive disorder, recurrent severe without psychotic features: Secondary | ICD-10-CM | POA: Diagnosis not present

## 2021-08-04 DIAGNOSIS — M25562 Pain in left knee: Secondary | ICD-10-CM | POA: Diagnosis not present

## 2021-08-04 DIAGNOSIS — F411 Generalized anxiety disorder: Secondary | ICD-10-CM | POA: Diagnosis not present

## 2021-08-04 DIAGNOSIS — M25662 Stiffness of left knee, not elsewhere classified: Secondary | ICD-10-CM

## 2021-08-04 NOTE — Therapy (Signed)
[facility] [address] [place], [place], [address] Phone: [phone]   Fax:  [phone]  Physical Therapy Treatment  Patient Details  Name: MD SMOLA MRN: [id number] Date of Birth: 15-Feb-1951 Referring Provider (PT): Newt Minion, MD   Encounter Date: 08/04/2021   PT End of Session - 08/04/21 1304     Visit Number 11    Number of Visits 16    Date for PT Re-Evaluation 08/24/21    Authorization Type Medicare/BCBS    Progress Note Due on Visit 20    PT Start Time 1259    PT Stop Time 1343    PT Time Calculation (min) 44 min    Activity Tolerance Patient limited by pain    Behavior During Therapy [facility] for tasks assessed/performed             Past Medical History:  Diagnosis Date   ADD (attention deficit disorder)    Anxiety 2007   Asthma as child   Asymptomatic gallstones    Atrial fibrillation ([place])    Atrial flutter ([place])    Depression    DJD (degenerative joint disease) of knee    Dysrhythmia    Chronic Atrial Fib- seeing Dr. Lenna Sciara [facility]   H/O peptic ulcer    past history with GI bleed- cauterization done throught scope   History of kidney stones    pt claims urology discovered a kidney stone last week 05/25/21   Hypercholesterolemia    Orthostatic hypotension    Pneumonia as child   Pneumothorax on left 05/20/2009   Prediabetes    none now   Pulmonary nodules    Sleep apnea    cpap set on 13   Thrombocytopenia ([place])    pt denies   Thyromegaly    Varicose veins     Past Surgical History:  Procedure Laterality Date   BIOPSY  04/18/2018   Procedure: BIOPSY;  Surgeon: Ronnette Juniper, MD;  Location: [facility];  Service: Gastroenterology;;   ESOPHAGOGASTRODUODENOSCOPY N/A 04/18/2018   Procedure: ESOPHAGOGASTRODUODENOSCOPY (EGD);  Surgeon: Ronnette Juniper, MD;  Location: [facility];  Service: Gastroenterology;  Laterality: N/A;   ESOPHAGOGASTRODUODENOSCOPY (EGD) WITH PROPOFOL N/A 01/05/2015    Procedure: ESOPHAGOGASTRODUODENOSCOPY (EGD) WITH PROPOFOL;  Surgeon: Garlan Fair, MD;  Location: [facility];  Service: Endoscopy;  Laterality: N/A;   GASTRIC ROUX-EN-Y     '08-[name] (weight stable around 302 #   KNEE ARTHROSCOPY     TONSILLECTOMY     TOTAL KNEE ARTHROPLASTY Left 06/03/2021   Procedure: LEFT TOTAL KNEE ARTHROPLASTY;  Surgeon: Newt Minion, MD;  Location: [facility];  Service: Orthopedics;  Laterality: Left;   ULNAR NERVE TRANSPOSITION Right 15 yrs ago    There were no vitals filed for this visit.   Subjective Assessment - 08/04/21 1302     Subjective He started feeling some ache / discomfort in knee at front & back which he associates with leg press and anterior lower leg.    Limitations Standing;Walking    Patient Stated Goals improve mobility and pain    Currently in Pain? Yes    Pain Score 1     Pain Location Knee    Pain Orientation Left;Anterior;Posterior    Pain Descriptors / Indicators Aching    Pain Type Surgical pain    Pain Onset More than a month ago    Pain Frequency Intermittent    Aggravating Factors  leg press    Pain Relieving Factors tylenol  Equality Adult PT Treatment/Exercise - 08/04/21 1259       Ambulation/Gait   Stairs Yes    Stairs Assistance 5: Supervision    Stairs Assistance Details (indicate cue type and reason) verbal cues on technique    Stair Management Technique One rail Right;Alternating pattern;Forwards    Number of Stairs 11    Height of Stairs 7      Neuro Re-ed    Neuro Re-ed Details  crossways stance on foam beam with head turns;  tandem stance on foam 30 sec 2 sets bilateral; LLE on foam Y reaching RLE 5 reps ea.      Knee/Hip Exercises: Aerobic   Recumbent Bike seat 12, L5 x 10 min      Knee/Hip Exercises: Machines for Strengthening   Cybex Knee Extension BLEs 20# 15 reps,  LLE 10# 10 reps 2 sets    Cybex Knee Flexion LLE 20# 15 reps 2 sets    Total Gym Leg Press  --      Knee/Hip Exercises: Standing   Heel Raises Both;1 set;10 reps;3 seconds    Heel Raises Limitations alternating toe raises 3 sec hold      Knee/Hip Exercises: Seated   Sit to Sand 1 set;10 reps;without UE support                       PT Short Term Goals - 07/27/21 1342       PT SHORT TERM GOAL #1   Title Independent with initial HEP    Time 4    Period Weeks    Status Achieved    Target Date 07/27/21      PT SHORT TERM GOAL #2   Title Lt knee AROM improved 0-105 for improved function    Time 4    Period Weeks    Status Achieved    Target Date 07/27/21               PT Long Term Goals - 07/27/21 1342       PT LONG TERM GOAL #1   Title Independent with final HEP    Time 8    Period Weeks    Status On-going    Target Date 08/24/21      PT LONG TERM GOAL #2   Title Lt knee AROM improved 0-110 for improved function    Time 8    Period Weeks    Status Achieved    Target Date 08/24/21      PT LONG TERM GOAL #3   Title Amb independently without significant deviations for improved function    Time 8    Period Weeks    Status On-going    Target Date 08/24/21      PT LONG TERM GOAL #4   Title Report pain < 3/10 for improved function    Time 8    Period Weeks    Status On-going    Target Date 08/24/21      PT LONG TERM GOAL #5   Title FOTO score improved to 60 for improved function    Time 8    Period Weeks    Status On-going    Target Date 08/24/21                   Plan - 08/04/21 1304     Clinical Impression Statement Patient improved his ability to negotiate stairs using an alternating pattern.  PT worked on standing balance activities  with noted improved knee control.  Pt reported leg press seemed to bother his knee so switched to knee ext & flex which he tolerated well.    Personal Factors and Comorbidities Comorbidity 3+    Comorbidities ADD, anxiety, deptression, a-fib    Examination-Activity Limitations  Locomotion Level;Transfers;Sit;Sleep;Squat;Stairs;Stand;Lift    Examination-Participation Restrictions Cleaning;Yard Work;Community Activity;Driving;Shop    Stability/Clinical Decision Making Evolving/Moderate complexity    Rehab Potential Good    PT Frequency 2x / week    PT Duration 8 weeks    PT Treatment/Interventions ADLs/Self Care Home Management;Cryotherapy;Electrical Stimulation;Moist Heat;Balance training;Therapeutic exercise;Therapeutic activities;Functional mobility training;Stair training;Gait training;Neuromuscular re-education;Patient/family education;Scar mobilization;Manual techniques;Passive range of motion;Dry needling;Taping;Vasopneumatic Device    PT Next Visit Plan continue progression in strength and functional movement as tolerated.  balance exercises    PT Home Exercise Plan Access Code: [email or web address]    Consulted and Agree with Plan of Care Patient             Patient will benefit from skilled therapeutic intervention in order to improve the following deficits and impairments:  Abnormal gait, Pain, Decreased mobility, Decreased range of motion, Decreased endurance, Decreased strength, Impaired flexibility, Decreased balance  Visit Diagnosis: Acute pain of left knee  Stiffness of left knee, not elsewhere classified  Other abnormalities of gait and mobility     Problem List Patient Active Problem List   Diagnosis Date Noted   Total knee replacement status, 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   Unilateral primary osteoarthritis, left knee    Longstanding persistent atrial fibrillation ([place]) 04/26/2021   Preop cardiovascular exam 04/26/2021   Orthostatic hypotension 02/20/2019   Chronic anticoagulation 03/05/2015   Abnormal chest x-ray with multiple lung nodules 12/14/2014   AS (aortic stenosis) 12/14/2014   Atrial flutter by electrocardiogram ([place]) 12/14/2014   CAD (coronary artery disease) 12/14/2014   DJD (degenerative joint disease) 12/14/2014   History of GI bleed  12/14/2014   Nephrolithiasis 12/14/2014   Other activity(E029.9) 12/14/2014   Prediabetes 12/14/2014   Thrombocytopenia ([place]) 12/14/2014   Arterial blood pressure decreased 10/28/2014   Atrial fibrillation ([place]) 09/02/2014   Morbid obesity ([place]) 09/02/2014   AF (paroxysmal atrial fibrillation) ([place]) 08/03/2014   Gastroduodenal ulcer 08/03/2014   Apnea, sleep 08/03/2014   Glaucoma suspect 02/21/2013   Glaucoma suspect of both eyes 02/21/2013   Herpes 04/11/2012   Routine general medical examination at a health care facility 04/11/2012   DYSLIPIDEMIA 05/31/2009   DEPRESSION/ANXIETY 05/31/2009   ATTENTION DEFICIT DISORDER 05/31/2009   Obstructive sleep apnea 05/31/2009   GERD 05/31/2009   GOUT, HX OF 05/31/2009    Jamey Reas, PT, DPT 08/04/2021, 1:46 PM  [facility] [address] [place], [place], [address] Phone: [phone]   Fax:  [phone]  Name: NORMAL RECINOS MRN: [id number] Date of Birth: Apr 25, 1951

## 2021-08-08 ENCOUNTER — Encounter: Payer: Self-pay | Admitting: Physical Therapy

## 2021-08-08 ENCOUNTER — Ambulatory Visit (INDEPENDENT_AMBULATORY_CARE_PROVIDER_SITE_OTHER): Payer: Medicare Other | Admitting: Physical Therapy

## 2021-08-08 ENCOUNTER — Other Ambulatory Visit: Payer: Self-pay

## 2021-08-08 DIAGNOSIS — R2689 Other abnormalities of gait and mobility: Secondary | ICD-10-CM | POA: Diagnosis not present

## 2021-08-08 DIAGNOSIS — R600 Localized edema: Secondary | ICD-10-CM | POA: Diagnosis not present

## 2021-08-08 DIAGNOSIS — M25562 Pain in left knee: Secondary | ICD-10-CM | POA: Diagnosis not present

## 2021-08-08 DIAGNOSIS — M25662 Stiffness of left knee, not elsewhere classified: Secondary | ICD-10-CM

## 2021-08-08 NOTE — Progress Notes (Signed)
Talked with patient. Instructions given. Arrival time 0800. Clear liquids until 0600 meds and hx reviewed.to stop Eliquis tonight . Friend will be the driver.will bring Cpap

## 2021-08-08 NOTE — Progress Notes (Signed)
Patient called to ask if it was okay for him to get COVID booster today prior to upcoming procedure on Thursday. Per Dr. Diona Fanti, okay to receive vaccine today. Pt notified of information and verbalized understanding and appreciation.

## 2021-08-08 NOTE — Therapy (Signed)
[facility] [address] [place], [place], [address] Phone: [phone]   Fax:  [phone]  Physical Therapy Treatment  Patient Details  Name: Francisco Padilla MRN: [id number] Date of Birth: Feb 13, 1951 Referring Provider (PT): Newt Minion, MD   Encounter Date: 08/08/2021   PT End of Session - 08/08/21 1427     Visit Number 12    Number of Visits 16    Date for PT Re-Evaluation 08/24/21    Authorization Type Medicare/BCBS    Progress Note Due on Visit 20    PT Start Time 1345    PT Stop Time 1427    PT Time Calculation (min) 42 min    Activity Tolerance Patient limited by pain    Behavior During Therapy [facility] for tasks assessed/performed             Past Medical History:  Diagnosis Date   ADD (attention deficit disorder)    Anxiety 2007   Asthma as child   Asymptomatic gallstones    Atrial fibrillation ([place])    Atrial flutter ([place])    Depression    DJD (degenerative joint disease) of knee    Dysrhythmia    Chronic Atrial Fib- seeing Dr. Lenna Sci[facility]   H/O peptic ulcer    past history with GI bleed- cauterization done throught scope   History of kidney stones    pt claims urology discovered a kidney stone last week 05/25/21   Hypercholesterolemia    Orthostatic hypotension    Pneumonia as child   Pneumothorax on left 05/20/2009   Prediabetes    none now   Pulmonary nodules    Sleep apnea    cpap set on 13   Thrombocytopenia ([place])    pt denies   Thyromegaly    Varicose veins     Past Surgical History:  Procedure Laterality Date   BIOPSY  04/18/2018   Procedure: BIOPSY;  Surgeon: Ronnette Juniper, MD;  Location: [facility];  Service: Gastroenterology;;   ESOPHAGOGASTRODUODENOSCOPY N/A 04/18/2018   Procedure: ESOPHAGOGASTRODUODENOSCOPY (EGD);  Surgeon: Ronnette Juniper, MD;  Location: [facility];  Service: Gastroenterology;  Laterality: N/A;   ESOPHAGOGASTRODUODENOSCOPY (EGD) WITH PROPOFOL N/A 01/05/2015    Procedure: ESOPHAGOGASTRODUODENOSCOPY (EGD) WITH PROPOFOL;  Surgeon: Garlan Fair, MD;  Location: [facility];  Service: Endoscopy;  Laterality: N/A;   GASTRIC ROUX-EN-Y     '08-[name] (weight stable around 302 #   KNEE ARTHROSCOPY     TONSILLECTOMY     TOTAL KNEE ARTHROPLASTY Left 06/03/2021   Procedure: LEFT TOTAL KNEE ARTHROPLASTY;  Surgeon: Newt Minion, MD;  Location: [facility];  Service: Orthopedics;  Laterality: Left;   ULNAR NERVE TRANSPOSITION Right 15 yrs ago    There were no vitals filed for this visit.   Subjective Assessment - 08/08/21 1345     Subjective doing well, missed a step the other day and stepped hard on his Lt leg, knee held up and no pain    Limitations Standing;Walking    Patient Stated Goals improve mobility and pain    Currently in Pain? No/denies                               [facility] Adult PT Treatment/Exercise - 08/08/21 1351       Knee/Hip Exercises: Aerobic   Recumbent Bike seat 12, L6 x 10 min      Knee/Hip Exercises: Machines for Strengthening   Cybex Knee  Extension BLEs 20# 2x15 reps,  LLE 10# x15 reps    Total Gym Leg Press LLE only 50# 2x15      Knee/Hip Exercises: Standing   SLS LLE on compliant surface 5x10 sec with intermittent UE support; LLE with Rt toe touch behind alternating 5# kettlebell holds 3x10                       PT Short Term Goals - 07/27/21 1342       PT SHORT TERM GOAL #1   Title Independent with initial HEP    Time 4    Period Weeks    Status Achieved    Target Date 07/27/21      PT SHORT TERM GOAL #2   Title Lt knee AROM improved 0-105 for improved function    Time 4    Period Weeks    Status Achieved    Target Date 07/27/21               PT Long Term Goals - 07/27/21 1342       PT LONG TERM GOAL #1   Title Independent with final HEP    Time 8    Period Weeks    Status On-going    Target Date 08/24/21      PT LONG TERM GOAL #2   Title Lt knee AROM improved  0-110 for improved function    Time 8    Period Weeks    Status Achieved    Target Date 08/24/21      PT LONG TERM GOAL #3   Title Amb independently without significant deviations for improved function    Time 8    Period Weeks    Status On-going    Target Date 08/24/21      PT LONG TERM GOAL #4   Title Report pain < 3/10 for improved function    Time 8    Period Weeks    Status On-going    Target Date 08/24/21      PT LONG TERM GOAL #5   Title FOTO score improved to 60 for improved function    Time 8    Period Weeks    Status On-going    Target Date 08/24/21                   Plan - 08/08/21 1427     Clinical Impression Statement Pt tolerated session well today working on balance and strengthening exercises.  Will continue to benefit from PT to maximize function.  Anticipate nearing d/c from PT.    Personal Factors and Comorbidities Comorbidity 3+    Comorbidities ADD, anxiety, deptression, a-fib    Examination-Activity Limitations Locomotion Level;Transfers;Sit;Sleep;Squat;Stairs;Stand;Lift    Examination-Participation Restrictions Cleaning;Yard Work;Community Activity;Driving;Shop    Stability/Clinical Decision Making Evolving/Moderate complexity    Rehab Potential Good    PT Frequency 2x / week    PT Duration 8 weeks    PT Treatment/Interventions ADLs/Self Care Home Management;Cryotherapy;Electrical Stimulation;Moist Heat;Balance training;Therapeutic exercise;Therapeutic activities;Functional mobility training;Stair training;Gait training;Neuromuscular re-education;Patient/family education;Scar mobilization;Manual techniques;Passive range of motion;Dry needling;Taping;Vasopneumatic Device    PT Next Visit Plan continue progression in strength and functional movement as tolerated.  balance exercises    PT Home Exercise Plan Access Code: [email or web address]    Consulted and Agree with Plan of Care Patient             Patient will benefit from skilled therapeutic  intervention in order to improve the  following deficits and impairments:  Abnormal gait, Pain, Decreased mobility, Decreased range of motion, Decreased endurance, Decreased strength, Impaired flexibility, Decreased balance  Visit Diagnosis: Acute pain of left knee  Stiffness of left knee, not elsewhere classified  Other abnormalities of gait and mobility  Localized edema     Problem List Patient Active Problem List   Diagnosis Date Noted   Total knee replacement status, 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   Unilateral primary osteoarthritis, left knee    Longstanding persistent atrial fibrillation ([place]) 04/26/2021   Preop cardiovascular exam 04/26/2021   Orthostatic hypotension 02/20/2019   Chronic anticoagulation 03/05/2015   Abnormal chest x-ray with multiple lung nodules 12/14/2014   AS (aortic stenosis) 12/14/2014   Atrial flutter by electrocardiogram ([place]) 12/14/2014   CAD (coronary artery disease) 12/14/2014   DJD (degenerative joint disease) 12/14/2014   History of GI bleed 12/14/2014   Nephrolithiasis 12/14/2014   Other activity(E029.9) 12/14/2014   Prediabetes 12/14/2014   Thrombocytopenia ([place]) 12/14/2014   Arterial blood pressure decreased 10/28/2014   Atrial fibrillation ([place]) 09/02/2014   Morbid obesity ([place]) 09/02/2014   AF (paroxysmal atrial fibrillation) ([place]) 08/03/2014   Gastroduodenal ulcer 08/03/2014   Apnea, sleep 08/03/2014   Glaucoma suspect 02/21/2013   Glaucoma suspect of both eyes 02/21/2013   Herpes 04/11/2012   Routine general medical examination at a health care facility 04/11/2012   DYSLIPIDEMIA 05/31/2009   DEPRESSION/ANXIETY 05/31/2009   ATTENTION DEFICIT DISORDER 05/31/2009   Obstructive sleep apnea 05/31/2009   GERD 05/31/2009   GOUT, HX OF 05/31/2009      Laureen Abrahams, PT, DPT 08/08/21 2:29 PM     [facility] [address] [place], [place], [address] Phone: [phone]   Fax:  [phone]  Name:  Francisco Padilla MRN: [id number] Date of Birth: February 15, 1951

## 2021-08-10 ENCOUNTER — Encounter: Payer: Self-pay | Admitting: Physical Therapy

## 2021-08-10 ENCOUNTER — Ambulatory Visit (INDEPENDENT_AMBULATORY_CARE_PROVIDER_SITE_OTHER): Payer: Medicare Other | Admitting: Physical Therapy

## 2021-08-10 ENCOUNTER — Other Ambulatory Visit: Payer: Self-pay

## 2021-08-10 DIAGNOSIS — M25562 Pain in left knee: Secondary | ICD-10-CM | POA: Diagnosis not present

## 2021-08-10 DIAGNOSIS — M25662 Stiffness of left knee, not elsewhere classified: Secondary | ICD-10-CM

## 2021-08-10 DIAGNOSIS — R600 Localized edema: Secondary | ICD-10-CM

## 2021-08-10 DIAGNOSIS — R2689 Other abnormalities of gait and mobility: Secondary | ICD-10-CM

## 2021-08-10 NOTE — Therapy (Signed)
[facility] [address] [place], [place], [address] Phone: [phone]   Fax:  [phone]  Physical Therapy Treatment  Patient Details  Name: Francisco Padilla MRN: [id number] Date of Birth: 12-21-1950 Referring Provider (PT): Newt Minion, MD   Encounter Date: 08/10/2021   PT End of Session - 08/10/21 1425     Visit Number 13    Number of Visits 16    Date for PT Re-Evaluation 08/24/21    Authorization Type Medicare/BCBS    Progress Note Due on Visit 93    PT Start Time 1346    PT Stop Time 4132    PT Time Calculation (min) 39 min    Activity Tolerance Patient limited by pain    Behavior During Therapy [facility] for tasks assessed/performed             Past Medical History:  Diagnosis Date   ADD (attention deficit disorder)    Anxiety 2007   Asthma as child   Asymptomatic gallstones    Atrial fibrillation ([place])    Atrial flutter (HCC)    Depression    DJD (degenerative joint disease) of knee    Dysrhythmia    Chronic Atrial Fib- seeing Dr. Lenna [facility]   H/O peptic ulcer    past history with GI bleed- cauterization done throught scope   History of kidney stones    pt claims urology discovered a kidney stone last week 05/25/21   Hypercholesterolemia    Orthostatic hypotension    Pneumonia as child   Pneumothorax on left 05/20/2009   Prediabetes    none now   Pulmonary nodules    Sleep apnea    cpap set on 13   Thrombocytopenia ([place])    pt denies   Thyromegaly    Varicose veins     Past Surgical History:  Procedure Laterality Date   BIOPSY  04/18/2018   Procedure: BIOPSY;  Surgeon: Ronnette Juniper, MD;  Location: [facility];  Service: Gastroenterology;;   ESOPHAGOGASTRODUODENOSCOPY N/A 04/18/2018   Procedure: ESOPHAGOGASTRODUODENOSCOPY (EGD);  Surgeon: Ronnette Juniper, MD;  Location: [facility];  Service: Gastroenterology;  Laterality: N/A;   ESOPHAGOGASTRODUODENOSCOPY (EGD) WITH PROPOFOL N/A 01/05/2015    Procedure: ESOPHAGOGASTRODUODENOSCOPY (EGD) WITH PROPOFOL;  Surgeon: Garlan Fair, MD;  [facility]: [facility];  Service: Endoscopy;  Laterality: N/A;   GASTRIC ROUX-EN-Y     '08-[name] (weight stable around 302 #   KNEE ARTHROSCOPY     TONSILLECTOMY     TOTAL KNEE ARTHROPLASTY Left 06/03/2021   Procedure: LEFT TOTAL KNEE ARTHROPLASTY;  Surgeon: Newt Minion, MD;  Location: [place];  Service: Orthopedics;  Laterality: Left;   ULNAR NERVE TRANSPOSITION Right 15 yrs ago    There were no vitals filed for this visit.   Subjective Assessment - 08/10/21 1352     Subjective has lithotripsy tomorrow, knee is doing well    Limitations Standing;Walking    Patient Stated Goals improve mobility and pain    Currently in Pain? No/denies                               [facility] Adult PT Treatment/Exercise - 08/10/21 1354       Knee/Hip Exercises: Aerobic   Recumbent Bike seat 12, L6 x 10 min      Knee/Hip Exercises: Machines for Strengthening   Cybex Knee Extension BLEs 20# 2x10 reps,  LLE 10# x15 reps  Balance Exercises - 08/10/21 1401       Balance Exercises: Standing   SLS Eyes open;Solid surface;Intermittent upper extremity support;3 reps;30 secs    Tandem Gait Forward;Upper extremity support;4 reps;Foam/compliant surface    Sidestepping Foam/compliant support;4 reps                  PT Short Term Goals - 07/27/21 1342       PT SHORT TERM GOAL #1   Title Independent with initial HEP    Time 4    Period Weeks    Status Achieved    Target Date 07/27/21      PT SHORT TERM GOAL #2   Title Lt knee AROM improved 0-105 for improved function    Time 4    Period Weeks    Status Achieved    Target Date 07/27/21               PT Long Term Goals - 07/27/21 1342       PT LONG TERM GOAL #1   Title Independent with final HEP    Time 8    Period Weeks    Status On-going    Target Date 08/24/21      PT LONG TERM GOAL #2    Title Lt knee AROM improved 0-110 for improved function    Time 8    Period Weeks    Status Achieved    Target Date 08/24/21      PT LONG TERM GOAL #3   Title Amb independently without significant deviations for improved function    Time 8    Period Weeks    Status On-going    Target Date 08/24/21      PT LONG TERM GOAL #4   Title Report pain < 3/10 for improved function    Time 8    Period Weeks    Status On-going    Target Date 08/24/21      PT LONG TERM GOAL #5   Title FOTO score improved to 60 for improved function    Time 8    Period Weeks    Status On-going    Target Date 08/24/21                   Plan - 08/10/21 1425     Clinical Impression Statement Progressing well with PT, still needing intermittent UE support with balance activities.  Will continue to benefit from PT to maximize function.    Personal Factors and Comorbidities Comorbidity 3+    Comorbidities ADD, anxiety, deptression, a-fib    Examination-Activity Limitations Locomotion Level;Transfers;Sit;Sleep;Squat;Stairs;Stand;Lift    Examination-Participation Restrictions Cleaning;Yard Work;Community Activity;Driving;Shop    Stability/Clinical Decision Making Evolving/Moderate complexity    Rehab Potential Good    PT Frequency 2x / week    PT Duration 8 weeks    PT Treatment/Interventions ADLs/Self Care Home Management;Cryotherapy;Electrical Stimulation;Moist Heat;Balance training;Therapeutic exercise;Therapeutic activities;Functional mobility training;Stair training;Gait training;Neuromuscular re-education;Patient/family education;Scar mobilization;Manual techniques;Passive range of motion;Dry needling;Taping;Vasopneumatic Device    PT Next Visit Plan continue progression in strength and functional movement as tolerated.  balance exercises; review/update HEP    PT Home Exercise Plan Access Code: [email or web address]    Consulted and Agree with Plan of Care Patient             Patient will benefit from  skilled therapeutic intervention in order to improve the following deficits and impairments:  Abnormal gait, Pain, Decreased mobility, Decreased range of motion, Decreased endurance, Decreased strength, Impaired  flexibility, Decreased balance  Visit Diagnosis: Acute pain of left knee  Stiffness of left knee, not elsewhere classified  Other abnormalities of gait and mobility  Localized edema     Problem List Patient Active Problem List   Diagnosis Date Noted   Total knee replacement status, 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   Unilateral primary osteoarthritis, left knee    Longstanding persistent atrial fibrillation ([place]) 04/26/2021   Preop cardiovascular exam 04/26/2021   Orthostatic hypotension 02/20/2019   Chronic anticoagulation 03/05/2015   Abnormal chest x-ray with multiple lung nodules 12/14/2014   AS (aortic stenosis) 12/14/2014   Atrial flutter by electrocardiogram ([place]) 12/14/2014   CAD (coronary artery disease) 12/14/2014   DJD (degenerative joint disease) 12/14/2014   History of GI bleed 12/14/2014   Nephrolithiasis 12/14/2014   Other activity(E029.9) 12/14/2014   Prediabetes 12/14/2014   Thrombocytopenia ([place]) 12/14/2014   Arterial blood pressure decreased 10/28/2014   Atrial fibrillation ([place]) 09/02/2014   Morbid obesity ([place]) 09/02/2014   AF (paroxysmal atrial fibrillation) ([place]) 08/03/2014   Gastroduodenal ulcer 08/03/2014   Apnea, sleep 08/03/2014   Glaucoma suspect 02/21/2013   Glaucoma suspect of both eyes 02/21/2013   Herpes 04/11/2012   Routine general medical examination at a health care facility 04/11/2012   DYSLIPIDEMIA 05/31/2009   DEPRESSION/ANXIETY 05/31/2009   ATTENTION DEFICIT DISORDER 05/31/2009   Obstructive sleep apnea 05/31/2009   GERD 05/31/2009   GOUT, HX OF 05/31/2009      Laureen Abrahams, PT, DPT 08/10/21 2:27 PM      [facility] [address] [place], [place], [address] Phone: [phone]   Fax:   [phone]  Name: Francisco Padilla MRN: [id number] Date of Birth: 1950-09-30

## 2021-08-11 ENCOUNTER — Encounter (HOSPITAL_BASED_OUTPATIENT_CLINIC_OR_DEPARTMENT_OTHER)
Admission: RE | Disposition: A | Discharge status: Home / Self Care | Payer: Self-pay | Source: Home / Self Care | Attending: Urology

## 2021-08-11 ENCOUNTER — Ambulatory Visit (HOSPITAL_COMMUNITY): Payer: Medicare Other

## 2021-08-11 ENCOUNTER — Encounter (HOSPITAL_BASED_OUTPATIENT_CLINIC_OR_DEPARTMENT_OTHER): Payer: Self-pay | Admitting: Urology

## 2021-08-11 ENCOUNTER — Other Ambulatory Visit: Payer: Self-pay

## 2021-08-11 ENCOUNTER — Ambulatory Visit (HOSPITAL_BASED_OUTPATIENT_CLINIC_OR_DEPARTMENT_OTHER)
Admission: RE | Admit: 2021-08-11 | Discharge: 2021-08-11 | Disposition: A | Discharge status: Home / Self Care | Payer: Medicare Other | Attending: Urology | Admitting: Urology

## 2021-08-11 DIAGNOSIS — Z87442 Personal history of urinary calculi: Secondary | ICD-10-CM | POA: Diagnosis not present

## 2021-08-11 DIAGNOSIS — N200 Calculus of kidney: Secondary | ICD-10-CM | POA: Diagnosis not present

## 2021-08-11 DIAGNOSIS — N2889 Other specified disorders of kidney and ureter: Secondary | ICD-10-CM | POA: Diagnosis not present

## 2021-08-11 DIAGNOSIS — M47816 Spondylosis without myelopathy or radiculopathy, lumbar region: Secondary | ICD-10-CM | POA: Diagnosis not present

## 2021-08-11 HISTORY — PX: EXTRACORPOREAL SHOCK WAVE LITHOTRIPSY: SHX1557

## 2021-08-11 SURGERY — LITHOTRIPSY, ESWL
Anesthesia: LOCAL | Laterality: Left

## 2021-08-11 MED ORDER — DIAZEPAM 5 MG PO TABS
ORAL_TABLET | ORAL | Status: AC
  Filled 2021-08-11: qty 2

## 2021-08-11 MED ORDER — CIPROFLOXACIN HCL 500 MG PO TABS
500.0000 mg | ORAL_TABLET | ORAL | Status: AC
Start: 2021-08-11 — End: 2021-08-11
  Administered 2021-08-11: 500 mg via ORAL

## 2021-08-11 MED ORDER — DOCUSATE SODIUM 100 MG PO CAPS: 100.0000 mg | ORAL_CAPSULE | Freq: Every day | ORAL | 0 refills | Status: AC | PRN

## 2021-08-11 MED ORDER — DIAZEPAM 5 MG PO TABS
10.0000 mg | ORAL_TABLET | ORAL | Status: AC
Start: 2021-08-11 — End: 2021-08-11
  Administered 2021-08-11: 10 mg via ORAL

## 2021-08-11 MED ORDER — DIPHENHYDRAMINE HCL 25 MG PO CAPS
ORAL_CAPSULE | ORAL | Status: AC
  Filled 2021-08-11: qty 1

## 2021-08-11 MED ORDER — SODIUM CHLORIDE 0.9 % IV SOLN: INTRAVENOUS | Status: DC

## 2021-08-11 MED ORDER — OXYCODONE-ACETAMINOPHEN 5-325 MG PO TABS
1.0000 | ORAL_TABLET | ORAL | 0 refills | Status: AC | PRN
Start: 2021-08-11 — End: 2021-08-26

## 2021-08-11 MED ORDER — DIPHENHYDRAMINE HCL 25 MG PO CAPS
25.0000 mg | ORAL_CAPSULE | ORAL | Status: AC
Start: 2021-08-11 — End: 2021-08-11
  Administered 2021-08-11: 25 mg via ORAL

## 2021-08-11 MED ORDER — CIPROFLOXACIN HCL 500 MG PO TABS
ORAL_TABLET | ORAL | Status: AC
  Filled 2021-08-11: qty 1

## 2021-08-11 NOTE — H&P (Signed)
Urology Preoperative H&P   Chief Complaint: Left renal stone  History of Present Illness: Francisco Padilla is a 71 y.o. male with left renal stone. Denies fevers, chills, dysuria.    Past Medical History:  Diagnosis Date   ADD (attention deficit disorder)    Anxiety 2007   Asthma as child   Asymptomatic gallstones    Atrial fibrillation (HCC)    Atrial flutter (HCC)    Depression    DJD (degenerative joint disease) of knee    Dysrhythmia    Chronic Atrial Fib- seeing Dr. Lenna Sci[facility]   H/O peptic ulcer    past history with GI bleed- cauterization done throught scope   History of kidney stones    pt claims urology discovered a kidney stone last week 05/25/21   Hypercholesterolemia    Orthostatic hypotension    Pneumonia as child   Pneumothorax on left 05/20/2009   Prediabetes    none now   Pulmonary nodules    Sleep apnea    cpap set on 13   Thrombocytopenia ([place])    pt denies   Thyromegaly    Varicose veins     Past Surgical History:  Procedure Laterality Date   BIOPSY  04/18/2018   Procedure: BIOPSY;  Surgeon: Ronnette Juniper, MD;  [facility];  Service: Gastroenterology;;   ESOPHAGOGASTRODUODENOSCOPY N/A 04/18/2018   Procedure: ESOPHAGOGASTRODUODENOSCOPY (EGD);  Surgeon: Ronnette Juniper, MD;  Location: [facility];  Service: Gastroenterology;  Laterality: N/A;   ESOPHAGOGASTRODUODENOSCOPY (EGD) WITH PROPOFOL N/A 01/05/2015   Procedure: ESOPHAGOGASTRODUODENOSCOPY (EGD) WITH PROPOFOL;  Surgeon: Garlan Fair, MD;  [facility];  Service: Endoscopy;  Laterality: N/A;   GASTRIC ROUX-EN-Y     '08-[name] (weight stable around 302 #   KNEE ARTHROSCOPY     TONSILLECTOMY     TOTAL KNEE ARTHROPLASTY Left 06/03/2021   Procedure: LEFT TOTAL KNEE ARTHROPLASTY;  Surgeon: Newt Minion, MD;  Location: [place];  Service: Orthopedics;  Laterality: Left;   ULNAR NERVE TRANSPOSITION Right 15 yrs ago    Allergies:  Allergies  Allergen Reactions   Aspirin      Avoid due to bariatric surgery   Nsaids     Avoid due to bariatric surgery   Oxycodone Hcl     Hallucinations, agitation     Family History  Problem Relation Age of Onset   Heart disease Mother    Heart failure Mother    Heart disease Father    Heart attack Father    Stroke Neg Hx     Social History:  reports that he has never smoked. He has never used smokeless tobacco. He reports that he does not drink alcohol and does not use drugs.  ROS: A complete review of systems was performed.  All systems are negative except for pertinent findings as noted.  Physical Exam:  Vital signs in last 24 hours: Temp:  [97.5 F (36.4 C)] 97.5 F (36.4 C) (01/19 0905) Pulse Rate:  [79] 79 (01/19 0905) Resp:  [17] 17 (01/19 0905) BP: (139)/(98) 139/98 (01/19 0905) SpO2:  [100 %] 100 % (01/19 0905) Weight:  [103.4 kg] 103.4 kg (01/19 0905) Constitutional:  Alert and oriented, No acute distress Cardiovascular: Regular rate and rhythm Respiratory: Normal respiratory effort, Lungs clear bilaterally GI: Abdomen is soft, nontender, nondistended, no abdominal masses GU: No CVA tenderness Lymphatic: No lymphadenopathy Neurologic: Grossly intact, no focal deficits Psychiatric: Normal mood and affect  Laboratory Data:  No results for input(s): WBC, HGB, HCT,  PLT in the last 72 hours.  No results for input(s): NA, K, CL, GLUCOSE, BUN, CALCIUM, CREATININE in the last 72 hours.  Invalid input(s): CO3   No results found for this or any previous visit (from the past 24 hour(s)). No results found for this or any previous visit (from the past 240 hour(s)).  Renal Function: No results for input(s): CREATININE in the last 168 hours. CrCl cannot be calculated (Patient's most recent lab result is older than the maximum 21 days allowed.).  Radiologic Imaging: No results found.  I independently reviewed the above imaging studies.  Assessment and Plan Francisco Padilla is a 71 y.o. male with left  renal stone here for left ESWL.  The risks, benefits and alternatives of left ESWL was discussed with the patient. I described the risks which include arrhythmia, kidney contusion, kidney hemorrhage, need for transfusion, back discomfort, flank ecchymosis, flank abrasion, inability to fracture the stone, inability to pass stone fragments, Steinstrasse, infection associated with obstructing stones, need for an alternative surgical procedure and possible need for repeat shockwave lithotripsy.  The patient voices understanding and wishes to proceed.       Matt R. Aayla Marrocco MD 08/11/2021, 10:28 AM  [facility] Pager: [phone]): [phone]

## 2021-08-11 NOTE — Op Note (Signed)
[facility] Operative Note  Treating Physician: Khayman Kirsch, MD  Pre-op diagnosis: Left renal stone  Post-op diagnosis: Same   Procedure: Left ESWL  See [facility] OP note scanned into chart. Also because of the size, density, location and other factors that cannot be anticipated I feel this will likely be a staged procedure. This fact supersedes any indication in the scanned [facility] operative note to the contrary.  Matt R. Oluwatosin Higginson MD [facility]  Pager: [phone]   

## 2021-08-11 NOTE — Discharge Instructions (Addendum)

## 2021-08-12 ENCOUNTER — Encounter (HOSPITAL_BASED_OUTPATIENT_CLINIC_OR_DEPARTMENT_OTHER): Payer: Self-pay | Admitting: Urology

## 2021-08-12 DIAGNOSIS — H18513 Endothelial corneal dystrophy, bilateral: Secondary | ICD-10-CM | POA: Diagnosis not present

## 2021-08-12 DIAGNOSIS — H5213 Myopia, bilateral: Secondary | ICD-10-CM | POA: Diagnosis not present

## 2021-08-12 DIAGNOSIS — H2513 Age-related nuclear cataract, bilateral: Secondary | ICD-10-CM | POA: Diagnosis not present

## 2021-08-12 DIAGNOSIS — H401132 Primary open-angle glaucoma, bilateral, moderate stage: Secondary | ICD-10-CM | POA: Diagnosis not present

## 2021-08-12 DIAGNOSIS — H16223 Keratoconjunctivitis sicca, not specified as Sjogren's, bilateral: Secondary | ICD-10-CM | POA: Diagnosis not present

## 2021-08-15 ENCOUNTER — Encounter: Payer: Self-pay | Admitting: Physical Therapy

## 2021-08-15 ENCOUNTER — Telehealth: Payer: Self-pay | Admitting: Physical Therapy

## 2021-08-15 ENCOUNTER — Other Ambulatory Visit: Payer: Self-pay

## 2021-08-15 ENCOUNTER — Ambulatory Visit (INDEPENDENT_AMBULATORY_CARE_PROVIDER_SITE_OTHER): Payer: Medicare Other | Admitting: Physical Therapy

## 2021-08-15 DIAGNOSIS — M25662 Stiffness of left knee, not elsewhere classified: Secondary | ICD-10-CM

## 2021-08-15 DIAGNOSIS — M25562 Pain in left knee: Secondary | ICD-10-CM | POA: Diagnosis not present

## 2021-08-15 DIAGNOSIS — R600 Localized edema: Secondary | ICD-10-CM | POA: Diagnosis not present

## 2021-08-15 DIAGNOSIS — R2689 Other abnormalities of gait and mobility: Secondary | ICD-10-CM | POA: Diagnosis not present

## 2021-08-15 NOTE — Telephone Encounter (Signed)
Pt has lithotripsy on Thursday 08/11/21.  He wants to know if he needs to take any antibiotics following the procedure.  His urologist deferred this to you.  Thanks- [name]

## 2021-08-15 NOTE — Telephone Encounter (Signed)
Please see below. Pt is having procedure on Thursday can you advise?

## 2021-08-15 NOTE — Therapy (Signed)
[facility] [address] [place], [place], [address] Phone: [phone]   Fax:  [phone]  Physical Therapy Treatment  Patient Details  Name: Francisco Padilla MRN: [id number] Date of Birth: 1951/04/30 Referring Provider (PT): Newt Minion, MD   Encounter Date: 08/15/2021   PT End of Session - 08/15/21 1425     Visit Number 14    Number of Visits 16    Date for PT Re-Evaluation 08/24/21    Authorization Type Medicare/BCBS    Progress Note Due on Visit 65    PT Start Time 1341    PT Stop Time 4818    PT Time Calculation (min) 41 min    Activity Tolerance Patient limited by pain    Behavior During Therapy [facility] for tasks assessed/performed             Past Medical History:  Diagnosis Date   ADD (attention deficit disorder)    Anxiety 2007   Asthma as child   Asymptomatic gallstones    Atrial fibrillation ([place])    Atrial flutter ([place])    Depression    DJD (degenerative joint disease) of knee    Dysrhythmia    Chronic Atrial Fib- seeing Dr. Lenna Sciara [facility]   H/O peptic ulcer    past history with GI bleed- cauterization done throught scope   History of kidney stones    pt claims urology discovered a kidney stone last week 05/25/21   Hypercholesterolemia    Orthostatic hypotension    Pneumonia as child   Pneumothorax on left 05/20/2009   Prediabetes    none now   Pulmonary nodules    Sleep apnea    cpap set on 13   Thrombocytopenia ([place])    pt denies   Thyromegaly    Varicose veins     Past Surgical History:  Procedure Laterality Date   BIOPSY  04/18/2018   Procedure: BIOPSY;  Surgeon: Ronnette Juniper, MD;  [facility];  Service: Gastroenterology;;   ESOPHAGOGASTRODUODENOSCOPY N/A 04/18/2018   Procedure: ESOPHAGOGASTRODUODENOSCOPY (EGD);  Surgeon: Ronnette Juniper, MD;  Location: [facility];  Service: Gastroenterology;  Laterality: N/A;   ESOPHAGOGASTRODUODENOSCOPY (EGD) WITH PROPOFOL N/A 01/05/2015    Procedure: ESOPHAGOGASTRODUODENOSCOPY (EGD) WITH PROPOFOL;  Surgeon: Garlan Fair, MD;  [facility];  Service: Endoscopy;  Laterality: N/A;   EXTRACORPOREAL SHOCK WAVE LITHOTRIPSY Left 08/11/2021   Procedure: LEFT EXTRACORPOREAL SHOCK WAVE LITHOTRIPSY ([facility]);  Surgeon: Janith Lima, MD;  Location: [facility];  Service: Urology;  Laterality: Left;   GASTRIC ROUX-EN-Y     '08-[name] (weight stable around 302 #   KNEE ARTHROSCOPY     TONSILLECTOMY     TOTAL KNEE ARTHROPLASTY Left 06/03/2021   Procedure: LEFT TOTAL KNEE ARTHROPLASTY;  Surgeon: Newt Minion, MD;  Location: [facility];  Service: Orthopedics;  Laterality: Left;   ULNAR NERVE TRANSPOSITION Right 15 yrs ago    There were no vitals filed for this visit.   Subjective Assessment - 08/15/21 1344     Subjective didn't do much over weekend due to lithotripsy, but feeling better now.  plans to get back into the gym    Limitations Standing;Walking    Patient Stated Goals improve mobility and pain    Currently in Pain? No/denies                               [facility] Adult PT Treatment/Exercise - 08/15/21  1347       Knee/Hip Exercises: Stretches   Passive Hamstring Stretch Left;3 reps;30 seconds      Knee/Hip Exercises: Aerobic   Recumbent Bike L6 x 10 min      Knee/Hip Exercises: Machines for Strengthening   Cybex Knee Extension BLEs 20# 2x10 reps, bil concentric; LLE only eccentric      Knee/Hip Exercises: Standing   Other Standing Knee Exercises LLE single limb deadlift with 12# KB 2x10                       PT Short Term Goals - 07/27/21 1342       PT SHORT TERM GOAL #1   Title Independent with initial HEP    Time 4    Period Weeks    Status Achieved    Target Date 07/27/21      PT SHORT TERM GOAL #2   Title Lt knee AROM improved 0-105 for improved function    Time 4    Period Weeks    Status Achieved    Target Date 07/27/21               PT  Long Term Goals - 07/27/21 1342       PT LONG TERM GOAL #1   Title Independent with final HEP    Time 8    Period Weeks    Status On-going    Target Date 08/24/21      PT LONG TERM GOAL #2   Title Lt knee AROM improved 0-110 for improved function    Time 8    Period Weeks    Status Achieved    Target Date 08/24/21      PT LONG TERM GOAL #3   Title Amb independently without significant deviations for improved function    Time 8    Period Weeks    Status On-going    Target Date 08/24/21      PT LONG TERM GOAL #4   Title Report pain < 3/10 for improved function    Time 8    Period Weeks    Status On-going    Target Date 08/24/21      PT LONG TERM GOAL #5   Title FOTO score improved to 60 for improved function    Time 8    Period Weeks    Status On-going    Target Date 08/24/21                   Plan - 08/15/21 1426     Clinical Impression Statement Continue to work on strengthening and balance, and anticipate d/c next 1-2 weeks.  Will continue to benefit from PT to maximize function.    Personal Factors and Comorbidities Comorbidity 3+    Comorbidities ADD, anxiety, deptression, a-fib    Examination-Activity Limitations Locomotion Level;Transfers;Sit;Sleep;Squat;Stairs;Stand;Lift    Examination-Participation Restrictions Cleaning;Yard Work;Community Activity;Driving;Shop    Stability/Clinical Decision Making Evolving/Moderate complexity    Rehab Potential Good    PT Frequency 2x / week    PT Duration 8 weeks    PT Treatment/Interventions ADLs/Self Care Home Management;Cryotherapy;Electrical Stimulation;Moist Heat;Balance training;Therapeutic exercise;Therapeutic activities;Functional mobility training;Stair training;Gait training;Neuromuscular re-education;Patient/family education;Scar mobilization;Manual techniques;Passive range of motion;Dry needling;Taping;Vasopneumatic Device    PT Next Visit Plan continue progression in strength and functional movement  as tolerated.  balance exercises; review/update HEP    PT Home Exercise Plan Access Code: [email or web address]    Consulted and Agree with Plan of Care  Patient             Patient will benefit from skilled therapeutic intervention in order to improve the following deficits and impairments:  Abnormal gait, Pain, Decreased mobility, Decreased range of motion, Decreased endurance, Decreased strength, Impaired flexibility, Decreased balance  Visit Diagnosis: Acute pain of left knee  Stiffness of left knee, not elsewhere classified  Other abnormalities of gait and mobility  Localized edema     Problem List Patient Active Problem List   Diagnosis Date Noted   Total knee replacement status, 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   Unilateral primary osteoarthritis, left knee    Longstanding persistent atrial fibrillation ([place]) 04/26/2021   Preop cardiovascular exam 04/26/2021   Orthostatic hypotension 02/20/2019   Chronic anticoagulation 03/05/2015   Abnormal chest x-ray with multiple lung nodules 12/14/2014   AS (aortic stenosis) 12/14/2014   Atrial flutter by electrocardiogram ([place]) 12/14/2014   CAD (coronary artery disease) 12/14/2014   DJD (degenerative joint disease) 12/14/2014   History of GI bleed 12/14/2014   Nephrolithiasis 12/14/2014   Other activity(E029.9) 12/14/2014   Prediabetes 12/14/2014   Thrombocytopenia ([place]) 12/14/2014   Arterial blood pressure decreased 10/28/2014   Atrial fibrillation ([place]) 09/02/2014   Morbid obesity ([place]) 09/02/2014   AF (paroxysmal atrial fibrillation) ([place]) 08/03/2014   Gastroduodenal ulcer 08/03/2014   Apnea, sleep 08/03/2014   Glaucoma suspect 02/21/2013   Glaucoma suspect of both eyes 02/21/2013   Herpes 04/11/2012   Routine general medical examination at a health care facility 04/11/2012   DYSLIPIDEMIA 05/31/2009   DEPRESSION/ANXIETY 05/31/2009   ATTENTION DEFICIT DISORDER 05/31/2009   Obstructive sleep apnea 05/31/2009   GERD 05/31/2009   GOUT, HX OF  05/31/2009      Laureen Abrahams, PT, DPT 08/15/21 2:27 PM    [facility] [address] [place], [place], [address] Phone: [phone]   Fax:  [phone]  Name: Francisco Padilla MRN: [id number] Date of Birth: 05/13/1951

## 2021-08-16 NOTE — Telephone Encounter (Signed)
Does not need to take a course of abx for this

## 2021-08-16 NOTE — Telephone Encounter (Signed)
I called pt to advise that he does not need to take abx following procedure. He will call with any other questions.

## 2021-08-17 ENCOUNTER — Encounter: Payer: Self-pay | Admitting: Physical Therapy

## 2021-08-17 ENCOUNTER — Ambulatory Visit (INDEPENDENT_AMBULATORY_CARE_PROVIDER_SITE_OTHER): Payer: Medicare Other | Admitting: Physical Therapy

## 2021-08-17 ENCOUNTER — Other Ambulatory Visit: Payer: Self-pay

## 2021-08-17 DIAGNOSIS — M25662 Stiffness of left knee, not elsewhere classified: Secondary | ICD-10-CM | POA: Diagnosis not present

## 2021-08-17 DIAGNOSIS — R2689 Other abnormalities of gait and mobility: Secondary | ICD-10-CM | POA: Diagnosis not present

## 2021-08-17 DIAGNOSIS — R600 Localized edema: Secondary | ICD-10-CM | POA: Diagnosis not present

## 2021-08-17 DIAGNOSIS — M25562 Pain in left knee: Secondary | ICD-10-CM | POA: Diagnosis not present

## 2021-08-17 DIAGNOSIS — H401111 Primary open-angle glaucoma, right eye, mild stage: Secondary | ICD-10-CM | POA: Diagnosis not present

## 2021-08-17 DIAGNOSIS — H353131 Nonexudative age-related macular degeneration, bilateral, early dry stage: Secondary | ICD-10-CM | POA: Diagnosis not present

## 2021-08-17 DIAGNOSIS — H401122 Primary open-angle glaucoma, left eye, moderate stage: Secondary | ICD-10-CM | POA: Diagnosis not present

## 2021-08-17 DIAGNOSIS — H25813 Combined forms of age-related cataract, bilateral: Secondary | ICD-10-CM | POA: Diagnosis not present

## 2021-08-17 NOTE — Therapy (Signed)
[facility] [address] [place], [place], [address] Phone: [phone]   Fax:  [phone]  Physical Therapy Treatment  Patient Details  Name: Francisco Padilla MRN: [id number] Date of Birth: Jan 10, 1951 Referring Provider (PT): Newt Minion, MD   Encounter Date: 08/17/2021   PT End of Session - 08/17/21 1323     Visit Number 15    Number of Visits 16    Date for PT Re-Evaluation 08/24/21    Authorization Type Medicare/BCBS    Progress Note Due on Visit 20    PT Start Time 1300    PT Stop Time 1344    PT Time Calculation (min) 44 min    Activity Tolerance Patient limited by pain    Behavior During Therapy [facility] for tasks assessed/performed             Past Medical History:  Diagnosis Date   ADD (attention deficit disorder)    Anxiety 2007   Asthma as child   Asymptomatic gallstones    Atrial fibrillation (HCC)    Atrial flutter (HCC)    Depression    DJD (degenerative joint disease) of knee    Dysrhythmia    Chronic Atrial Fib- seeing Dr. Lenna Sciara [facility]   H/O peptic ulcer    past history with GI bleed- cauterization done throught scope   History of kidney stones    pt claims urology discovered a kidney stone last week 05/25/21   Hypercholesterolemia    Orthostatic hypotension    Pneumonia as child   Pneumothorax on left 05/20/2009   Prediabetes    none now   Pulmonary nodules    Sleep apnea    cpap set on 13   Thrombocytopenia ([place])    pt denies   Thyromegaly    Varicose veins     Past Surgical History:  Procedure Laterality Date   BIOPSY  04/18/2018   Procedure: BIOPSY;  Surgeon: Ronnette Juniper, MD;  [facility];  Service: Gastroenterology;;   ESOPHAGOGASTRODUODENOSCOPY N/A 04/18/2018   Procedure: ESOPHAGOGASTRODUODENOSCOPY (EGD);  Surgeon: Ronnette Juniper, MD;  [facility];  Service: Gastroenterology;  Laterality: N/A;   ESOPHAGOGASTRODUODENOSCOPY (EGD) WITH PROPOFOL N/A 01/05/2015    Procedure: ESOPHAGOGASTRODUODENOSCOPY (EGD) WITH PROPOFOL;  Surgeon: Garlan Fair, MD;  [facility];  Service: Endoscopy;  Laterality: N/A;   EXTRACORPOREAL SHOCK WAVE LITHOTRIPSY Left 08/11/2021   Procedure: LEFT EXTRACORPOREAL SHOCK WAVE LITHOTRIPSY (ESWL);  Surgeon: Janith Lima, MD;  Location: [facility];  Service: Urology;  Laterality: Left;   GASTRIC ROUX-EN-Y     '08-[name] (weight stable around 302 #   KNEE ARTHROSCOPY     TONSILLECTOMY     TOTAL KNEE ARTHROPLASTY Left 06/03/2021   Procedure: LEFT TOTAL KNEE ARTHROPLASTY;  Surgeon: Newt Minion, MD;  Location: [place] [facility];  Service: Orthopedics;  Laterality: Left;   ULNAR NERVE TRANSPOSITION Right 15 yrs ago    There were no vitals filed for this visit.   Subjective Assessment - 08/17/21 1258     Subjective just came from eye doctor so eyes are dilated    Limitations Standing;Walking    Patient Stated Goals improve mobility and pain    Currently in Pain? Yes    Pain Score 2     Pain Location Knee    Pain Orientation Left    Pain Descriptors / Indicators Aching    Pain Type Surgical pain;Acute pain    Pain Onset More than a month ago  Pain Frequency Intermittent    Aggravating Factors  stairs    Pain Relieving Factors tylenol                               [facility] Adult PT Treatment/Exercise - 08/17/21 1302       Knee/Hip Exercises: Aerobic   Recumbent Bike L7 x 10 min      Knee/Hip Exercises: Machines for Strengthening   Cybex Knee Extension BLEs 20# 2x10 reps, bil concentric; LLE only eccentric    Total Gym Leg Press LLE only 75# 2x15      Knee/Hip Exercises: Standing   Heel Raises 3 sets;10 reps    Step Down Limitations heel tap with LLE on 4" step 3x10                       PT Short Term Goals - 07/27/21 1342       PT SHORT TERM GOAL #1   Title Independent with initial HEP    Time 4    Period Weeks    Status Achieved    Target Date 07/27/21       PT SHORT TERM GOAL #2   Title Lt knee AROM improved 0-105 for improved function    Time 4    Period Weeks    Status Achieved    Target Date 07/27/21               PT Long Term Goals - 08/17/21 1342       PT LONG TERM GOAL #1   Title Independent with final HEP    Time 8    Period Weeks    Status On-going    Target Date 08/24/21      PT LONG TERM GOAL #2   Title Lt knee AROM improved 0-110 for improved function    Time 8    Period Weeks    Status Achieved    Target Date 08/24/21      PT LONG TERM GOAL #3   Title Amb independently without significant deviations for improved function    Time 8    Period Weeks    Status Achieved    Target Date 08/24/21      PT LONG TERM GOAL #4   Title Report pain < 3/10 for improved function    Time 8    Period Weeks    Status Achieved    Target Date 08/24/21      PT LONG TERM GOAL #5   Title FOTO score improved to 60 for improved function    Time 8    Period Weeks    Status On-going    Target Date 08/24/21                   Plan - 08/17/21 1343     Clinical Impression Statement Pt has met 3/5 LTGs and plan to assess the remaining goals.  Plan for d/c next visit.    Personal Factors and Comorbidities Comorbidity 3+    Comorbidities ADD, anxiety, deptression, a-fib    Examination-Activity Limitations Locomotion Level;Transfers;Sit;Sleep;Squat;Stairs;Stand;Lift    Examination-Participation Restrictions Cleaning;Yard Work;Community Activity;Driving;Shop    Stability/Clinical Decision Making Evolving/Moderate complexity    Rehab Potential Good    PT Frequency 2x / week    PT Duration 8 weeks    PT Treatment/Interventions ADLs/Self Care Home Management;Cryotherapy;Electrical Stimulation;Moist Heat;Balance training;Therapeutic exercise;Therapeutic activities;Functional mobility training;Stair training;Gait training;Neuromuscular re-education;Patient/family  education;Scar mobilization;Manual techniques;Passive  range of motion;Dry needling;Taping;Vasopneumatic Device    PT Next Visit Plan check goals and d/c PT    PT Home Exercise Plan Access Code: [email or web address]    Consulted and Agree with Plan of Care Patient             Patient will benefit from skilled therapeutic intervention in order to improve the following deficits and impairments:  Abnormal gait, Pain, Decreased mobility, Decreased range of motion, Decreased endurance, Decreased strength, Impaired flexibility, Decreased balance  Visit Diagnosis: Acute pain of left knee  Stiffness of left knee, not elsewhere classified  Other abnormalities of gait and mobility  Localized edema     Problem List Patient Active Problem List   Diagnosis Date Noted   Total knee replacement status, 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   Unilateral primary osteoarthritis, left knee    Longstanding persistent atrial fibrillation ([place]) 04/26/2021   Preop cardiovascular exam 04/26/2021   Orthostatic hypotension 02/20/2019   Chronic anticoagulation 03/05/2015   Abnormal chest x-ray with multiple lung nodules 12/14/2014   AS (aortic stenosis) 12/14/2014   Atrial flutter by electrocardiogram ([place]) 12/14/2014   CAD (coronary artery disease) 12/14/2014   DJD (degenerative joint disease) 12/14/2014   History of GI bleed 12/14/2014   Nephrolithiasis 12/14/2014   Other activity(E029.9) 12/14/2014   Prediabetes 12/14/2014   Thrombocytopenia ([place]) 12/14/2014   Arterial blood pressure decreased 10/28/2014   Atrial fibrillation ([place]) 09/02/2014   Morbid obesity ([place]) 09/02/2014   AF (paroxysmal atrial fibrillation) ([place]) 08/03/2014   Gastroduodenal ulcer 08/03/2014   Apnea, sleep 08/03/2014   Glaucoma suspect 02/21/2013   Glaucoma suspect of both eyes 02/21/2013   Herpes 04/11/2012   Routine general medical examination at a health care facility 04/11/2012   DYSLIPIDEMIA 05/31/2009   DEPRESSION/ANXIETY 05/31/2009   ATTENTION DEFICIT DISORDER 05/31/2009   Obstructive sleep  apnea 05/31/2009   GERD 05/31/2009   GOUT, HX OF 05/31/2009       Laureen Abrahams, PT, DPT 08/17/21 1:46 PM     [facility] [address] [place], [place], [address] Phone: [phone]   Fax:  [phone]  Name: Francisco Padilla MRN: [id number] Date of Birth: [birth year]

## 2021-08-18 DIAGNOSIS — F411 Generalized anxiety disorder: Secondary | ICD-10-CM | POA: Diagnosis not present

## 2021-08-18 DIAGNOSIS — F902 Attention-deficit hyperactivity disorder, combined type: Secondary | ICD-10-CM | POA: Diagnosis not present

## 2021-08-18 DIAGNOSIS — F332 Major depressive disorder, recurrent severe without psychotic features: Secondary | ICD-10-CM | POA: Diagnosis not present

## 2021-08-18 DIAGNOSIS — F410 Panic disorder [episodic paroxysmal anxiety] without agoraphobia: Secondary | ICD-10-CM | POA: Diagnosis not present

## 2021-08-22 ENCOUNTER — Ambulatory Visit (INDEPENDENT_AMBULATORY_CARE_PROVIDER_SITE_OTHER): Payer: Medicare Other | Admitting: Physical Therapy

## 2021-08-22 ENCOUNTER — Other Ambulatory Visit: Payer: Self-pay

## 2021-08-22 ENCOUNTER — Encounter: Payer: Self-pay | Admitting: Physical Therapy

## 2021-08-22 DIAGNOSIS — R2689 Other abnormalities of gait and mobility: Secondary | ICD-10-CM

## 2021-08-22 DIAGNOSIS — M25562 Pain in left knee: Secondary | ICD-10-CM

## 2021-08-22 DIAGNOSIS — R600 Localized edema: Secondary | ICD-10-CM | POA: Diagnosis not present

## 2021-08-22 DIAGNOSIS — M25662 Stiffness of left knee, not elsewhere classified: Secondary | ICD-10-CM | POA: Diagnosis not present

## 2021-08-22 NOTE — Therapy (Signed)
[facility] [address] [place], [place], [address] Phone: [phone]   Fax:  [phone]  Physical Therapy Treatment/Discharge Summary  Patient Details  Name: Francisco Padilla MRN: [id number] Date of Birth: 01/04/1951 Referring Provider (PT): Newt Minion, MD   Encounter Date: 08/22/2021   PT End of Session - 08/22/21 1344     Visit Number 16    Number of Visits 16    Date for PT Re-Evaluation 08/24/21    Authorization Type Medicare/BCBS    Progress Note Due on Visit 89    PT Start Time 1259    PT Stop Time 1340    PT Time Calculation (min) 41 min    Activity Tolerance Patient limited by pain    Behavior During Therapy [facility] for tasks assessed/performed             Past Medical History:  Diagnosis Date   ADD (attention deficit disorder)    Anxiety 2007   Asthma as child   Asymptomatic gallstones    Atrial fibrillation ([place])    Atrial flutter ([place])    Depression    DJD (degenerative joint disease) of knee    Dysrhythmia    Chronic Atrial Fib- seeing Dr. Lenna Sciara [facility]   H/O peptic ulcer    past history with GI bleed- cauterization done throught scope   History of kidney stones    pt claims urology discovered a kidney stone last week 05/25/21   Hypercholesterolemia    Orthostatic hypotension    Pneumonia as child   Pneumothorax on left 05/20/2009   Prediabetes    none now   Pulmonary nodules    Sleep apnea    cpap set on 13   Thrombocytopenia ([place])    pt denies   Thyromegaly    Varicose veins     Past Surgical History:  Procedure Laterality Date   BIOPSY  04/18/2018   Procedure: BIOPSY;  Surgeon: Ronnette Juniper, MD;  [facility];  Service: Gastroenterology;;   ESOPHAGOGASTRODUODENOSCOPY N/A 04/18/2018   Procedure: ESOPHAGOGASTRODUODENOSCOPY (EGD);  Surgeon: Ronnette Juniper, MD;  [facility];  Service: Gastroenterology;  Laterality: N/A;   ESOPHAGOGASTRODUODENOSCOPY (EGD) WITH PROPOFOL N/A  01/05/2015   Procedure: ESOPHAGOGASTRODUODENOSCOPY (EGD) WITH PROPOFOL;  Surgeon: Garlan Fair, MD;  [facility];  Service: Endoscopy;  Laterality: N/A;   EXTRACORPOREAL SHOCK WAVE LITHOTRIPSY Left 08/11/2021   Procedure: LEFT EXTRACORPOREAL SHOCK WAVE LITHOTRIPSY (ESWL);  Surgeon: Janith Lima, MD;  Location: [facility];  Service: Urology;  Laterality: Left;   GASTRIC ROUX-EN-Y     '08-[name] (weight stable around 302 #   KNEE ARTHROSCOPY     TONSILLECTOMY     TOTAL KNEE ARTHROPLASTY Left 06/03/2021   Procedure: LEFT TOTAL KNEE ARTHROPLASTY;  Surgeon: Newt Minion, MD;  Location: [place];  Service: Orthopedics;  Laterality: Left;   ULNAR NERVE TRANSPOSITION Right 15 yrs ago    There were no vitals filed for this visit.   Subjective Assessment - 08/22/21 1301     Subjective ready for d/c today from PT    Limitations Standing;Walking    Patient Stated Goals improve mobility and pain    Currently in Pain? No/denies                [facility] PT Assessment - 08/22/21 1323       Assessment   Medical Diagnosis Z96.652 (ICD-10-CM) - History of left knee replacement    Referring Provider (PT) Newt Minion,  MD      Observation/Other Assessments   Focus on Therapeutic Outcomes (FOTO)  1                           [place] Adult PT Treatment/Exercise - 08/22/21 1303       Exercises   Other Exercises  reviewed HEP and adjustments made - added gym program to HEP as pt is back to gym      Knee/Hip Exercises: Aerobic   Recumbent Bike L7 x 10 min                     PT Education - 08/22/21 1323     Education Details HEP, stretches and gym program    Person(s) Educated Patient    Methods Explanation;Demonstration;Handout    Comprehension Verbalized understanding;Returned demonstration              PT Short Term Goals - 07/27/21 1342       PT SHORT TERM GOAL #1   Title Independent with initial HEP    Time 4     Period Weeks    Status Achieved    Target Date 07/27/21      PT SHORT TERM GOAL #2   Title Lt knee AROM improved 0-105 for improved function    Time 4    Period Weeks    Status Achieved    Target Date 07/27/21               PT Long Term Goals - 08/22/21 1344       PT LONG TERM GOAL #1   Title Independent with final HEP    Time 8    Period Weeks    Status Achieved    Target Date 08/24/21      PT LONG TERM GOAL #2   Title Lt knee AROM improved 0-110 for improved function    Time 8    Period Weeks    Status Achieved    Target Date 08/24/21      PT LONG TERM GOAL #3   Title Amb independently without significant deviations for improved function    Time 8    Period Weeks    Status Achieved    Target Date 08/24/21      PT LONG TERM GOAL #4   Title Report pain < 3/10 for improved function    Time 8    Period Weeks    Status Achieved    Target Date 08/24/21      PT LONG TERM GOAL #5   Title FOTO score improved to 60 for improved function    Time 8    Period Weeks    Status Achieved    Target Date 08/24/21                   Plan - 08/22/21 1344     Clinical Impression Statement Pt has met all goals and is ready for d/c from PT.  Has extensive HEP and is active with community fitness.  Will d/c PT today.    Personal Factors and Comorbidities Comorbidity 3+    Comorbidities ADD, anxiety, deptression, a-fib    Examination-Activity Limitations Locomotion Level;Transfers;Sit;Sleep;Squat;Stairs;Stand;Lift    Examination-Participation Restrictions Cleaning;Yard Work;Community Activity;Driving;Shop    Stability/Clinical Decision Making Evolving/Moderate complexity    Rehab Potential Good    PT Frequency 2x / week    PT Duration 8 weeks    PT Treatment/Interventions  ADLs/Self Care Home Management;Cryotherapy;Electrical Stimulation;Moist Heat;Balance training;Therapeutic exercise;Therapeutic activities;Functional mobility training;Stair training;Gait  training;Neuromuscular re-education;Patient/family education;Scar mobilization;Manual techniques;Passive range of motion;Dry needling;Taping;Vasopneumatic Device    PT Next Visit Plan d/c PT today    PT Home Exercise Plan Access Code: [email or web address]    Consulted and Agree with Plan of Care Patient             Patient will benefit from skilled therapeutic intervention in order to improve the following deficits and impairments:  Abnormal gait, Pain, Decreased mobility, Decreased range of motion, Decreased endurance, Decreased strength, Impaired flexibility, Decreased balance  Visit Diagnosis: Acute pain of left knee  Stiffness of left knee, not elsewhere classified  Other abnormalities of gait and mobility  Localized edema     Problem List Patient Active Problem List   Diagnosis Date Noted   Total knee replacement status, 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   Unilateral primary osteoarthritis, left knee    Longstanding persistent atrial fibrillation ([place]) 04/26/2021   Preop cardiovascular exam 04/26/2021   Orthostatic hypotension 02/20/2019   Chronic anticoagulation 03/05/2015   Abnormal chest x-ray with multiple lung nodules 12/14/2014   AS (aortic stenosis) 12/14/2014   Atrial flutter by electrocardiogram ([place]) 12/14/2014   CAD (coronary artery disease) 12/14/2014   DJD (degenerative joint disease) 12/14/2014   History of GI bleed 12/14/2014   Nephrolithiasis 12/14/2014   Other activity(E029.9) 12/14/2014   Prediabetes 12/14/2014   Thrombocytopenia ([place]) 12/14/2014   Arterial blood pressure decreased 10/28/2014   Atrial fibrillation ([place]) 09/02/2014   Morbid obesity ([place]) 09/02/2014   AF (paroxysmal atrial fibrillation) ([place]) 08/03/2014   Gastroduodenal ulcer 08/03/2014   Apnea, sleep 08/03/2014   Glaucoma suspect 02/21/2013   Glaucoma suspect of both eyes 02/21/2013   Herpes 04/11/2012   Routine general medical examination at a health care facility 04/11/2012   DYSLIPIDEMIA 05/31/2009    DEPRESSION/ANXIETY 05/31/2009   ATTENTION DEFICIT DISORDER 05/31/2009   Obstructive sleep apnea 05/31/2009   GERD 05/31/2009   GOUT, HX OF 05/31/2009      Laureen Abrahams, PT, DPT 08/22/21 1:45 PM    [facility] [address] [place], [place], [address] Phone: [phone]   Fax:  [phone]  Name: Francisco Padilla MRN: [id number] Date of Birth: 10-12-1950      PHYSICAL THERAPY DISCHARGE SUMMARY  Visits from Start of Care: 16  Current functional level related to goals / functional outcomes: See above   Remaining deficits: See above   Education / Equipment: HEP   Patient agrees to discharge. Patient goals were met. Patient is being discharged due to meeting the stated rehab goals.   Laureen Abrahams, PT, DPT 08/22/21 1:46 PM  [facility] [address] [place], [place], [address] Phone: [phone]   Fax:  [phone]

## 2021-08-22 NOTE — Patient Instructions (Signed)
Access Code: [email or web address] URL: [email or web address] Date: 08/22/2021 Prepared by: Faustino Congress  Exercises Supine Quadriceps Stretch with Strap on Table - 2-3 x daily - 7 x weekly - 1 sets - 2-3 reps - 30 seconds hold Standing Gastroc Stretch on Step with Counter Support - 2-3 x daily - 7 x weekly - 1 sets - 2-3 reps - 30 seconds hold Seated Hamstring Stretch with Strap - 2-3 x daily - 7 x weekly - 1 sets - 2-3 reps - 30 seconds hold Mini Squat with Counter Support - 1 x daily - 7 x weekly - 3 sets - 10 reps Half Deadlift with Kettlebell - 1 x daily - 7 x weekly - 3 sets - 10 reps Full Leg Press - 1 x daily - 7 x weekly - 3 sets - 10 reps Single Leg Knee Extension with Weight Machine - 1 x daily - 7 x weekly - 3 sets - 10 reps Single Leg Hamstring Curl with Weight Machine - 1 x daily - 7 x weekly - 3 sets - 10 reps Standing Single Leg Stance with Unilateral Counter Support - 1 x daily - 7 x weekly - 1 sets - 3 reps - 10 sec hold

## 2021-08-24 ENCOUNTER — Encounter: Payer: Medicare Other | Admitting: Physical Therapy

## 2021-08-28 ENCOUNTER — Encounter: Payer: Self-pay | Admitting: Orthopedic Surgery

## 2021-08-28 NOTE — Progress Notes (Signed)
Office Visit Note   Patient: Francisco Padilla           Date of Birth: 07-04-1951           MRN: [id number] Visit Date: 07/28/2021              Requested by: Newt Minion, MD [place],  [place] [address] PCP: Lajean Manes, MD  Chief Complaint  Patient presents with   Left Knee - Routine Post Op    06/03/21 left total knee replacement       HPI: Patient is a 71 year old gentleman who presents the status post a left total knee arthroplasty on November 11.  He is using double extra-large compression stockings he is in physical therapy he states he is walking well feels great he is 2 months out from surgery.  Assessment & Plan: Visit Diagnoses:  1. History of left knee replacement     Plan: Patient will continue with physical therapy upstairs.  Follow-Up Instructions: Return if symptoms worsen or fail to improve.   Ortho Exam  Patient is alert, oriented, no adenopathy, well-dressed, normal affect, normal respiratory effort. Examination patient has full extension he is walking well there is no swelling no cellulitis no signs of infection.  Imaging: No results found. No images are attached to the encounter.  Labs: Lab Results  Component Value Date   REPTSTATUS 10/23/2009 FINAL 10/21/2009   CULT NO GROWTH 10/21/2009     Lab Results  Component Value Date   ALBUMIN 4.1 10/22/2009   ALBUMIN 4.6 10/21/2009    No results found for: MG No results found for: VD25OH  No results found for: PREALBUMIN CBC EXTENDED Latest Ref Rng & Units 05/31/2021 10/23/2009 10/23/2009  WBC 4.0 - 10.5 K/uL 6.8 6.2 6.3  RBC 4.22 - 5.81 MIL/uL 4.55 3.21(L) 3.29(L)  HGB 13.0 - 17.0 g/dL 14.0 10.4(L) 10.6(L)  HCT 39.0 - 52.0 % 41.8 30.3(L) 31.1(L)  PLT 150 - 400 K/uL 188 194 202  NEUTROABS 1.7 - 7.7 K/uL - - -  LYMPHSABS 0.7 - 4.0 K/uL - - -     There is no height or weight on file to calculate BMI.  Orders:  No orders of the defined types were placed in this  encounter.  No orders of the defined types were placed in this encounter.    Procedures: No procedures performed  Clinical Data: No additional findings.  ROS:  All other systems negative, except as noted in the HPI. Review of Systems  Objective: Vital Signs: There were no vitals taken for this visit.  Specialty Comments:  No specialty comments available.  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   Unilateral primary osteoarthritis, left knee    Longstanding persistent atrial fibrillation ([place]) 04/26/2021   Preop cardiovascular exam 04/26/2021   Orthostatic hypotension 02/20/2019   Chronic anticoagulation 03/05/2015   Abnormal chest x-ray with multiple lung nodules 12/14/2014   AS (aortic stenosis) 12/14/2014   Atrial flutter by electrocardiogram ([place]) 12/14/2014   CAD (coronary artery disease) 12/14/2014   DJD (degenerative joint disease) 12/14/2014   History of GI bleed 12/14/2014   Nephrolithiasis 12/14/2014   Other activity(E029.9) 12/14/2014   Prediabetes 12/14/2014   Thrombocytopenia ([place]) 12/14/2014   Arterial blood pressure decreased 10/28/2014   Atrial fibrillation ([place]) 09/02/2014   Morbid obesity ([place]) 09/02/2014   AF (paroxysmal atrial fibrillation) ([place]) 08/03/2014   Gastroduodenal ulcer 08/03/2014  Apnea, sleep 08/03/2014   Glaucoma suspect 02/21/2013   Glaucoma suspect of both eyes 02/21/2013   Herpes 04/11/2012   Routine general medical examination at a health care facility 04/11/2012   DYSLIPIDEMIA 05/31/2009   DEPRESSION/ANXIETY 05/31/2009   ATTENTION DEFICIT DISORDER 05/31/2009   Obstructive sleep apnea 05/31/2009   GERD 05/31/2009   GOUT, HX OF 05/31/2009   Past Medical History:  Diagnosis Date   ADD (attention deficit disorder)    Anxiety 2007   Asthma as child   Asymptomatic gallstones    Atrial fibrillation ([facility])    Atrial flutter ([facility])    Depression     DJD (degenerative joint disease) of knee    Dysrhythmia    Chronic Atrial Fib- seeing Dr. Lenna Sci[facility]   H/O peptic ulcer    past history with GI bleed- cauterization done throught scope   History of kidney stones    pt claims urology discovered a kidney stone last week 05/25/21   Hypercholesterolemia    Orthostatic hypotension    Pneumonia as child   Pneumothorax on left 05/20/2009   Prediabetes    none now   Pulmonary nodules    Sleep apnea    cpap set on 13   Thrombocytopenia (H[facility])    pt denies   Thyromegaly    Varicose veins     Family History  Problem Relation Age of Onset   Heart disease Mother    Heart failure Mother    Heart disease Father    Heart attack Father    Stroke Neg Hx     Past Surgical History:  Procedure Laterality Date   BIOPSY  04/18/2018   Procedure: BIOPSY;  Surgeon: Ronnette Juniper, MD;  [facility];  Service: Gastroenterology;;   ESOPHAGOGASTRODUODENOSCOPY N/A 04/18/2018   Procedure: ESOPHAGOGASTRODUODENOSCOPY (EGD);  Surgeon: Ronnette Juniper, MD;  [facility];  Service: Gastroenterology;  Laterality: N/A;   ESOPHAGOGASTRODUODENOSCOPY (EGD) WITH PROPOFOL N/A 01/05/2015   Procedure: ESOPHAGOGASTRODUODENOSCOPY (EGD) WITH PROPOFOL;  Surgeon: Garlan Fair, MD;  [facility];  Service: Endoscopy;  Laterality: N/A;   EXTRACORPOREAL SHOCK WAVE LITHOTRIPSY Left 08/11/2021   Procedure: LEFT EXTRACORPOREAL SHOCK WAVE LITHOTRIPSY (ESWL);  Surgeon: Janith Lima, MD;  Location[facility];  Service: Urology;  Laterality: Left;   GASTRIC ROUX-EN-Y     '08-[name] (weight stable around 302 #   KNEE ARTHROSCOPY     TONSILLECTOMY     TOTAL KNEE ARTHROPLASTY Left 06/03/2021   Procedure: LEFT TOTAL KNEE ARTHROPLASTY;  Surgeon: Newt Minion, MD;  Location: [place];  Service: Orthopedics;  Laterality: Left;   ULNAR NERVE TRANSPOSITION Right 15 yrs ago   Social History   Occupational History   Not on file   Tobacco Use   Smoking status: Never   Smokeless tobacco: Never  Vaping Use   Vaping Use: Never used  Substance and Sexual Activity   Alcohol use: No    Alcohol/week: 0.0 standard drinks   Drug use: Never   Sexual activity: Yes

## 2021-08-30 DIAGNOSIS — N201 Calculus of ureter: Secondary | ICD-10-CM | POA: Diagnosis not present

## 2021-09-01 DIAGNOSIS — F902 Attention-deficit hyperactivity disorder, combined type: Secondary | ICD-10-CM | POA: Diagnosis not present

## 2021-09-01 DIAGNOSIS — F332 Major depressive disorder, recurrent severe without psychotic features: Secondary | ICD-10-CM | POA: Diagnosis not present

## 2021-09-01 DIAGNOSIS — F411 Generalized anxiety disorder: Secondary | ICD-10-CM | POA: Diagnosis not present

## 2021-09-01 DIAGNOSIS — F410 Panic disorder [episodic paroxysmal anxiety] without agoraphobia: Secondary | ICD-10-CM | POA: Diagnosis not present

## 2021-09-06 ENCOUNTER — Ambulatory Visit (INDEPENDENT_AMBULATORY_CARE_PROVIDER_SITE_OTHER): Payer: Medicare Other | Admitting: Podiatry

## 2021-09-06 ENCOUNTER — Ambulatory Visit: Payer: Medicare Other

## 2021-09-06 ENCOUNTER — Other Ambulatory Visit: Payer: Self-pay

## 2021-09-06 DIAGNOSIS — M79674 Pain in right toe(s): Secondary | ICD-10-CM | POA: Diagnosis not present

## 2021-09-06 DIAGNOSIS — R5383 Other fatigue: Secondary | ICD-10-CM | POA: Diagnosis not present

## 2021-09-06 DIAGNOSIS — B351 Tinea unguium: Secondary | ICD-10-CM | POA: Diagnosis not present

## 2021-09-06 DIAGNOSIS — I350 Nonrheumatic aortic (valve) stenosis: Secondary | ICD-10-CM | POA: Diagnosis not present

## 2021-09-06 DIAGNOSIS — Z79899 Other long term (current) drug therapy: Secondary | ICD-10-CM | POA: Diagnosis not present

## 2021-09-06 DIAGNOSIS — M79675 Pain in left toe(s): Secondary | ICD-10-CM

## 2021-09-06 DIAGNOSIS — I482 Chronic atrial fibrillation, unspecified: Secondary | ICD-10-CM | POA: Diagnosis not present

## 2021-09-06 DIAGNOSIS — G629 Polyneuropathy, unspecified: Secondary | ICD-10-CM

## 2021-09-06 DIAGNOSIS — S9032XA Contusion of left foot, initial encounter: Secondary | ICD-10-CM

## 2021-09-06 DIAGNOSIS — I10 Essential (primary) hypertension: Secondary | ICD-10-CM | POA: Diagnosis not present

## 2021-09-06 DIAGNOSIS — G4733 Obstructive sleep apnea (adult) (pediatric): Secondary | ICD-10-CM | POA: Diagnosis not present

## 2021-09-07 ENCOUNTER — Encounter: Payer: Self-pay | Admitting: Vascular Surgery

## 2021-09-07 ENCOUNTER — Ambulatory Visit (INDEPENDENT_AMBULATORY_CARE_PROVIDER_SITE_OTHER): Payer: Medicare Other | Admitting: Vascular Surgery

## 2021-09-07 VITALS — BP 128/77 | HR 72 | Temp 98.2°F | Resp 20 | Ht 75.0 in | Wt 223.4 lb

## 2021-09-07 DIAGNOSIS — I83813 Varicose veins of bilateral lower extremities with pain: Secondary | ICD-10-CM | POA: Diagnosis not present

## 2021-09-07 NOTE — Progress Notes (Signed)
Patient ID: Francisco Padilla, male   DOB: 1951/03/29, 71 y.o.   MRN: [id number]  Reason for Consult: Follow-up   Referred by Lajean Manes, MD  Subjective:     HPI:  Francisco Padilla is a 71 y.o. male \\well -known to our service for left greater saphenous vein ablation with recanalization last evaluated by me in November.  He also has previous treatment of venous insufficiency on the right.  He has been very consistent with thigh-high compression stockings.  Denies any history of blood clots.  Very strong history of previous varicosities.  He does take Eliquis for atrial fibrillation.  Recent underwent total knee arthroplasty on the left from which she has recovered well.  Does not currently have any complaints of his varicose veins on either side and is happy wearing compression as long as needed.  Past Medical History:  Diagnosis Date   ADD (attention deficit disorder)    Anxiety 2007   Asthma as child   Asymptomatic gallstones    Atrial fibrillation (HCC)    Atrial flutter (HCC)    Depression    DJD (degenerative joint disease) of knee    Dysrhythmia    Chronic Atrial Fib- seeing Dr. Lenna Sciara [facility]   H/O peptic ulcer    past history with GI bleed- cauterization done throught scope   History of kidney stones    pt claims urology discovered a kidney stone last week 05/25/21   Hypercholesterolemia    Orthostatic hypotension    Pneumonia as child   Pneumothorax on left 05/20/2009   Prediabetes    none now   Pulmonary nodules    Sleep apnea    cpap set on 13   Thrombocytopenia (HCC)    pt denies   Thyromegaly    Varicose veins    Family History  Problem Relation Age of Onset   Heart disease Mother    Heart failure Mother    Heart disease Father    Heart attack Father    Stroke Neg Hx    Past Surgical History:  Procedure Laterality Date   BIOPSY  04/18/2018   Procedure: BIOPSY;  Surgeon: Ronnette Juniper, MD;  Location: [facility];  Service: Gastroenterology;;    ESOPHAGOGASTRODUODENOSCOPY N/A 04/18/2018   Procedure: ESOPHAGOGASTRODUODENOSCOPY (EGD);  Surgeon: Ronnette Juniper, MD;  Location: [facility];  Service: Gastroenterology;  Laterality: N/A;   ESOPHAGOGASTRODUODENOSCOPY (EGD) WITH PROPOFOL N/A 01/05/2015   Procedure: ESOPHAGOGASTRODUODENOSCOPY (EGD) WITH PROPOFOL;  Surgeon: Garlan Fair, MD;  [facility]: [facility];  Service: Endoscopy;  Laterality: N/A;   EXTRACORPOREAL SHOCK WAVE LITHOTRIPSY Left 08/11/2021   Procedure: LEFT EXTRACORPOREAL SHOCK WAVE LITHOTRIPSY (ESWL);  Surgeon: Janith Lima, MD;  Location: [facility];  Service: Urology;  Laterality: Left;   GASTRIC ROUX-EN-Y     '08-[name] (weight stable around 302 #   KNEE ARTHROSCOPY     TONSILLECTOMY     TOTAL KNEE ARTHROPLASTY Left 06/03/2021   Procedure: LEFT TOTAL KNEE ARTHROPLASTY;  Surgeon: Newt Minion, MD;  Location: [facility];  Service: Orthopedics;  Laterality: Left;   ULNAR NERVE TRANSPOSITION Right 15 yrs ago    Short Social History:  Social History   Tobacco Use   Smoking status: Never   Smokeless tobacco: Never  Substance Use Topics   Alcohol use: No    Alcohol/week: 0.0 standard drinks    Allergies  Allergen Reactions   Aspirin     Avoid due to bariatric surgery   Nsaids  Avoid due to bariatric surgery   Oxycodone Hcl     Hallucinations, agitation     Current Outpatient Medications  Medication Sig Dispense Refill   acetaminophen (TYLENOL) 650 MG CR tablet Take 1,300 mg by mouth every 8 (eight) hours as needed for pain.     atorvastatin (LIPITOR) 20 MG tablet Take 20 mg by mouth at bedtime.     Calcium Citrate-Vitamin D (CALCIUM CITRATE + D PO) Take 1 tablet by mouth 2 (two) times daily.     Cyanocobalamin (B-12 SL) Place 1 tablet under the tongue daily.     diphenhydrAMINE (BENADRYL) 25 MG tablet Take 12.5-25 mg by mouth at bedtime.     docusate sodium (COLACE) 100 MG capsule Take 1 capsule (100 mg total) by mouth daily as needed. 30  capsule 0   ELIQUIS 5 MG TABS tablet Take 5 mg by mouth 2 (two) times daily.   0   fludrocortisone (FLORINEF) 0.1 MG tablet Take 0.1 mg by mouth daily.     gabapentin (NEURONTIN) 100 MG capsule Take 400 mg by mouth at bedtime.     latanoprost (XALATAN) 0.005 % ophthalmic solution Place 1 drop into both eyes at bedtime.     LORazepam (ATIVAN) 1 MG tablet Take 0.5 mg by mouth every 4 (four) hours.     Multiple Vitamins-Minerals (ONE DAILY MULTIVITAMIN MEN) TABS Take 1 tablet by mouth 2 (two) times daily.     pantoprazole (PROTONIX) 40 MG tablet Take 80 mg by mouth at bedtime.      Polyethyl Glycol-Propyl Glycol (SYSTANE OP) Place 1 drop into both eyes daily as needed (dry eyes).     Probiotic Product (PROBIOTIC PO) Take 1 capsule by mouth daily.     sertraline (ZOLOFT) 100 MG tablet Take 100 mg by mouth at bedtime.     SUPER B COMPLEX/C PO Take 1 tablet by mouth daily.     zolpidem (AMBIEN) 10 MG tablet Take 5-10 mg by mouth at bedtime.  0   No current facility-administered medications for this visit.    Review of Systems  Constitutional:  Constitutional negative. HENT: HENT negative.  Eyes: Eyes negative.  Cardiovascular: Positive for leg swelling.  GI: Gastrointestinal negative.  Musculoskeletal: Positive for leg pain and joint pain.  Skin: Skin negative.  Neurological: Neurological negative. Hematologic: Hematologic/lymphatic negative.  Psychiatric: Psychiatric negative.       Objective:  Objective   Vitals:   09/07/21 1503  BP: 128/77  Pulse: 72  Resp: 20  Temp: 98.2 F (36.8 C)  SpO2: 99%  Weight: 223 lb 6.4 oz (101.3 kg)  Height: 6\' 3"  (1.905 m)   Body mass index is 27.92 kg/m.  Physical Exam HENT:     Head: Normocephalic.     Nose:     Comments: Wearing a mask Eyes:     Pupils: Pupils are equal, round, and reactive to light.  Cardiovascular:     Rate and Rhythm: Normal rate.  Abdominal:     General: Abdomen is flat.     Palpations: Abdomen is soft.   Musculoskeletal:     Cervical back: Normal range of motion.     Right lower leg: Edema present.     Left lower leg: Edema present.     Comments: Well healed left knee incision Multiple left medial leg and lateral leg varicosities  Skin:    General: Skin is warm.     Capillary Refill: Capillary refill takes less than 2 seconds.  Neurological:  Mental Status: He is alert.  Psychiatric:        Mood and Affect: Mood normal.        Behavior: Behavior normal.        Thought Content: Thought content normal.        Judgment: Judgment normal.    Data: LEFT           Reflux No Reflux Reflux Time Diameter cms Comments                                            Yes                                                     +--------------+---------+------+-----------+------------+-----------------  ----+   CFV                       yes    >1 second                                        +--------------+---------+------+-----------+------------+-----------------  ----+   FV mid                    yes    >1 second                                        +--------------+---------+------+-----------+------------+-----------------  ----+   Popliteal      no                                                                 +--------------+---------+------+-----------+------------+-----------------  ----+   GSV at [facility]                yes     >500 ms       1.15                              +--------------+---------+------+-----------+------------+-----------------  ----+   GSV prox thigh            yes     >500 ms       0.75                              +--------------+---------+------+-----------+------------+-----------------  ----+   GSV mid thigh             yes     >500 ms       1.46     small area of  chronic  thrombus                 +--------------+---------+------+-----------+------------+-----------------  ----+   GSV dist  thigh            yes     >500 ms       1.11     small area of  chronic                                                             thrombus                 +--------------+---------+------+-----------+------------+-----------------  ----+   GSV at knee               yes     >500 ms       0.70                              +--------------+---------+------+-----------+------------+-----------------  ----+   GSV prox calf             yes     >500 ms       0.74                              +--------------+---------+------+-----------+------------+-----------------  ----+   SSV Pop Fossa  no                               0.52                              +--------------+---------+------+-----------+------------+-----------------  ----+   SSV prox calf             yes     >500 ms       0.39                              +--------------+---------+------+-----------+------------+-----------------  ----+   SSV mid calf   no                               0.39                                Summary:  Left:  - No evidence of deep vein thrombosis seen in the left lower extremity,  from the common femoral through the popliteal veins.     - Venous reflux is noted in the left common femoral vein.  - Venous reflux is noted in the left sapheno-femoral junction.  - Venous reflux is noted in the left greater saphenous vein in the thigh.  - Venous reflux is noted in the left greater saphenous vein in the calf.  - Venous reflux is noted in the left femoral vein.  - Venous reflux is noted in the mid left short saphenous vein.      Assessment/Plan:     71 year old male with C3 venous disease on the left that is recurrent with  recanalization of his left greater saphenous vein and multiple associated varicosities.  At this time the patient is mostly asymptomatic and will continue compression stockings.  He can follow-up with me on an as-needed basis.     Waynetta Sandy MD [facility]

## 2021-09-10 NOTE — Progress Notes (Signed)
Subjective: 71 y.o. returns the office today for painful, elongated, thickened toenails which he cannot trim himself.  He states the neuropathy has been bothersome.  He does take 40 mg of gabapentin at nighttime which does help.  PCP: Lajean Manes, MD   Objective: AAO 3, NAD DP/PT pulses palpable, CRT less than 3 seconds Sensation decreased. Nails hypertrophic, dystrophic, elongated, brittle, discolored 10. There is tenderness overlying the nails 1-5 bilaterally. There is no surrounding erythema or drainage along the nail sites. No pain with calf compression, swelling, warmth, erythema.  Assessment: Patient presents with symptomatic onychomycosis, neuropathy  Plan: -Treatment options including alternatives, risks, complications were discussed -Nails sharply debrided 10 without complication/bleeding. -Continue gabapentin.  He follows up with his primary care doctor later today as well. -Discussed daily foot inspection. If there are any changes, to call the office immediately.  -Follow-up in 3 months or sooner if any problems are to arise. In the meantime, encouraged to call the office with any questions, concerns, changes symptoms.  Celesta Gentile, DPM

## 2021-09-15 DIAGNOSIS — F332 Major depressive disorder, recurrent severe without psychotic features: Secondary | ICD-10-CM | POA: Diagnosis not present

## 2021-09-15 DIAGNOSIS — F902 Attention-deficit hyperactivity disorder, combined type: Secondary | ICD-10-CM | POA: Diagnosis not present

## 2021-09-15 DIAGNOSIS — G4733 Obstructive sleep apnea (adult) (pediatric): Secondary | ICD-10-CM | POA: Diagnosis not present

## 2021-09-15 DIAGNOSIS — F410 Panic disorder [episodic paroxysmal anxiety] without agoraphobia: Secondary | ICD-10-CM | POA: Diagnosis not present

## 2021-09-15 DIAGNOSIS — F411 Generalized anxiety disorder: Secondary | ICD-10-CM | POA: Diagnosis not present

## 2021-09-29 DIAGNOSIS — F332 Major depressive disorder, recurrent severe without psychotic features: Secondary | ICD-10-CM | POA: Diagnosis not present

## 2021-09-29 DIAGNOSIS — F411 Generalized anxiety disorder: Secondary | ICD-10-CM | POA: Diagnosis not present

## 2021-09-29 DIAGNOSIS — F410 Panic disorder [episodic paroxysmal anxiety] without agoraphobia: Secondary | ICD-10-CM | POA: Diagnosis not present

## 2021-09-29 DIAGNOSIS — F902 Attention-deficit hyperactivity disorder, combined type: Secondary | ICD-10-CM | POA: Diagnosis not present

## 2021-10-06 DIAGNOSIS — Z9884 Bariatric surgery status: Secondary | ICD-10-CM | POA: Diagnosis not present

## 2021-10-06 DIAGNOSIS — K449 Diaphragmatic hernia without obstruction or gangrene: Secondary | ICD-10-CM | POA: Diagnosis not present

## 2021-10-06 DIAGNOSIS — Z13810 Encounter for screening for upper gastrointestinal disorder: Secondary | ICD-10-CM | POA: Diagnosis not present

## 2021-10-06 DIAGNOSIS — K2270 Barrett's esophagus without dysplasia: Secondary | ICD-10-CM | POA: Diagnosis not present

## 2021-10-10 DIAGNOSIS — H353131 Nonexudative age-related macular degeneration, bilateral, early dry stage: Secondary | ICD-10-CM | POA: Diagnosis not present

## 2021-10-10 DIAGNOSIS — H401111 Primary open-angle glaucoma, right eye, mild stage: Secondary | ICD-10-CM | POA: Diagnosis not present

## 2021-10-10 DIAGNOSIS — H401122 Primary open-angle glaucoma, left eye, moderate stage: Secondary | ICD-10-CM | POA: Diagnosis not present

## 2021-10-11 DIAGNOSIS — F332 Major depressive disorder, recurrent severe without psychotic features: Secondary | ICD-10-CM | POA: Diagnosis not present

## 2021-10-11 DIAGNOSIS — F411 Generalized anxiety disorder: Secondary | ICD-10-CM | POA: Diagnosis not present

## 2021-10-11 DIAGNOSIS — F902 Attention-deficit hyperactivity disorder, combined type: Secondary | ICD-10-CM | POA: Diagnosis not present

## 2021-10-11 DIAGNOSIS — F410 Panic disorder [episodic paroxysmal anxiety] without agoraphobia: Secondary | ICD-10-CM | POA: Diagnosis not present

## 2021-10-11 DIAGNOSIS — K2270 Barrett's esophagus without dysplasia: Secondary | ICD-10-CM | POA: Diagnosis not present

## 2021-10-12 DIAGNOSIS — N201 Calculus of ureter: Secondary | ICD-10-CM | POA: Diagnosis not present

## 2021-10-20 DIAGNOSIS — H43813 Vitreous degeneration, bilateral: Secondary | ICD-10-CM | POA: Diagnosis not present

## 2021-10-20 DIAGNOSIS — H353131 Nonexudative age-related macular degeneration, bilateral, early dry stage: Secondary | ICD-10-CM | POA: Diagnosis not present

## 2021-10-20 DIAGNOSIS — H31091 Other chorioretinal scars, right eye: Secondary | ICD-10-CM | POA: Diagnosis not present

## 2021-10-20 DIAGNOSIS — H2513 Age-related nuclear cataract, bilateral: Secondary | ICD-10-CM | POA: Diagnosis not present

## 2021-10-27 DIAGNOSIS — F332 Major depressive disorder, recurrent severe without psychotic features: Secondary | ICD-10-CM | POA: Diagnosis not present

## 2021-10-27 DIAGNOSIS — F902 Attention-deficit hyperactivity disorder, combined type: Secondary | ICD-10-CM | POA: Diagnosis not present

## 2021-10-27 DIAGNOSIS — F411 Generalized anxiety disorder: Secondary | ICD-10-CM | POA: Diagnosis not present

## 2021-10-27 DIAGNOSIS — F410 Panic disorder [episodic paroxysmal anxiety] without agoraphobia: Secondary | ICD-10-CM | POA: Diagnosis not present

## 2021-11-10 DIAGNOSIS — F411 Generalized anxiety disorder: Secondary | ICD-10-CM | POA: Diagnosis not present

## 2021-11-10 DIAGNOSIS — F902 Attention-deficit hyperactivity disorder, combined type: Secondary | ICD-10-CM | POA: Diagnosis not present

## 2021-11-10 DIAGNOSIS — F332 Major depressive disorder, recurrent severe without psychotic features: Secondary | ICD-10-CM | POA: Diagnosis not present

## 2021-11-10 DIAGNOSIS — Z20822 Contact with and (suspected) exposure to covid-19: Secondary | ICD-10-CM | POA: Diagnosis not present

## 2021-11-10 DIAGNOSIS — F410 Panic disorder [episodic paroxysmal anxiety] without agoraphobia: Secondary | ICD-10-CM | POA: Diagnosis not present

## 2021-11-15 ENCOUNTER — Telehealth: Payer: Self-pay | Admitting: Orthopedic Surgery

## 2021-11-15 NOTE — Telephone Encounter (Signed)
Pt called requesting a cal from [place] or [place]. Pt want to know is it safe for him to get into whirlpool at the [facility]. Pt had surgery couple months back. Please cal pt at [phone]. ?

## 2021-11-15 NOTE — Telephone Encounter (Signed)
Pt informed okay to get into whirlpool. S/p knee replacement in 05/2021 ?

## 2021-11-24 DIAGNOSIS — F902 Attention-deficit hyperactivity disorder, combined type: Secondary | ICD-10-CM | POA: Diagnosis not present

## 2021-11-24 DIAGNOSIS — F411 Generalized anxiety disorder: Secondary | ICD-10-CM | POA: Diagnosis not present

## 2021-11-24 DIAGNOSIS — F410 Panic disorder [episodic paroxysmal anxiety] without agoraphobia: Secondary | ICD-10-CM | POA: Diagnosis not present

## 2021-11-24 DIAGNOSIS — F332 Major depressive disorder, recurrent severe without psychotic features: Secondary | ICD-10-CM | POA: Diagnosis not present

## 2021-11-30 DIAGNOSIS — Z20822 Contact with and (suspected) exposure to covid-19: Secondary | ICD-10-CM | POA: Diagnosis not present

## 2021-12-05 ENCOUNTER — Ambulatory Visit (INDEPENDENT_AMBULATORY_CARE_PROVIDER_SITE_OTHER): Payer: Medicare Other | Admitting: Podiatry

## 2021-12-05 DIAGNOSIS — B351 Tinea unguium: Secondary | ICD-10-CM

## 2021-12-05 DIAGNOSIS — G629 Polyneuropathy, unspecified: Secondary | ICD-10-CM

## 2021-12-05 DIAGNOSIS — M79674 Pain in right toe(s): Secondary | ICD-10-CM

## 2021-12-05 DIAGNOSIS — M79675 Pain in left toe(s): Secondary | ICD-10-CM | POA: Diagnosis not present

## 2021-12-05 DIAGNOSIS — I739 Peripheral vascular disease, unspecified: Secondary | ICD-10-CM | POA: Diagnosis not present

## 2021-12-06 ENCOUNTER — Ambulatory Visit (HOSPITAL_COMMUNITY)
Admission: RE | Admit: 2021-12-06 | Discharge: 2021-12-06 | Disposition: A | Discharge status: Home / Self Care | Payer: Medicare Other | Source: Ambulatory Visit | Attending: Podiatry | Admitting: Podiatry

## 2021-12-06 DIAGNOSIS — I739 Peripheral vascular disease, unspecified: Secondary | ICD-10-CM | POA: Diagnosis not present

## 2021-12-06 DIAGNOSIS — I8393 Asymptomatic varicose veins of bilateral lower extremities: Secondary | ICD-10-CM

## 2021-12-07 DIAGNOSIS — I350 Nonrheumatic aortic (valve) stenosis: Secondary | ICD-10-CM | POA: Diagnosis not present

## 2021-12-07 DIAGNOSIS — D6869 Other thrombophilia: Secondary | ICD-10-CM | POA: Diagnosis not present

## 2021-12-07 DIAGNOSIS — I10 Essential (primary) hypertension: Secondary | ICD-10-CM | POA: Diagnosis not present

## 2021-12-07 DIAGNOSIS — E7800 Pure hypercholesterolemia, unspecified: Secondary | ICD-10-CM | POA: Diagnosis not present

## 2021-12-07 DIAGNOSIS — G609 Hereditary and idiopathic neuropathy, unspecified: Secondary | ICD-10-CM | POA: Diagnosis not present

## 2021-12-07 DIAGNOSIS — I700 Atherosclerosis of aorta: Secondary | ICD-10-CM | POA: Diagnosis not present

## 2021-12-07 DIAGNOSIS — I482 Chronic atrial fibrillation, unspecified: Secondary | ICD-10-CM | POA: Diagnosis not present

## 2021-12-07 NOTE — Progress Notes (Signed)
Subjective: ?71 y.o. returns the office today for painful, elongated, thickened toenails which he cannot trim himself.  He states the neuropathy has been bothersome.  He is on gabapentin.  He does bring up a history of circulation issues in his family is asking if this could be contributing. ? ?PCP: Lajean Manes, MD ? ? ?Objective: ?AAO ?3, NAD ?DP/PT pulses palpable, CRT less than 3 seconds ?Sensation decreased. ?Nails hypertrophic, dystrophic, elongated, brittle, discolored ?10. There is tenderness overlying the nails 1-5 bilaterally. There is no surrounding erythema or drainage along the nail sites. ?No pain with calf compression, swelling, warmth, erythema. ? ?Assessment: ?Patient presents with symptomatic onychomycosis, neuropathy ? ?Plan: ?-Treatment options including alternatives, risks, complications were discussed ?-Nails sharply debrided ?10 without complication/bleeding. ?-Continue gabapentin.  We discussed changing or increasing his medication dose. I do not think that this is a circulation issue, so I ordered an arterial study given his family history. ?-Discussed daily foot inspection. If there are any changes, to call the office immediately.  ?-Follow-up in 3 months or sooner if any problems are to arise. In the meantime, encouraged to call the office with any questions, concerns, changes symptoms. ? ?Celesta Gentile, DPM ? ? ?

## 2021-12-08 DIAGNOSIS — F902 Attention-deficit hyperactivity disorder, combined type: Secondary | ICD-10-CM | POA: Diagnosis not present

## 2021-12-08 DIAGNOSIS — F411 Generalized anxiety disorder: Secondary | ICD-10-CM | POA: Diagnosis not present

## 2021-12-08 DIAGNOSIS — F410 Panic disorder [episodic paroxysmal anxiety] without agoraphobia: Secondary | ICD-10-CM | POA: Diagnosis not present

## 2021-12-08 DIAGNOSIS — F332 Major depressive disorder, recurrent severe without psychotic features: Secondary | ICD-10-CM | POA: Diagnosis not present

## 2021-12-09 ENCOUNTER — Encounter (HOSPITAL_COMMUNITY): Payer: Medicare Other

## 2021-12-12 DIAGNOSIS — G4733 Obstructive sleep apnea (adult) (pediatric): Secondary | ICD-10-CM | POA: Diagnosis not present

## 2021-12-13 DIAGNOSIS — H353131 Nonexudative age-related macular degeneration, bilateral, early dry stage: Secondary | ICD-10-CM | POA: Diagnosis not present

## 2021-12-13 DIAGNOSIS — H25813 Combined forms of age-related cataract, bilateral: Secondary | ICD-10-CM | POA: Diagnosis not present

## 2021-12-13 DIAGNOSIS — H401111 Primary open-angle glaucoma, right eye, mild stage: Secondary | ICD-10-CM | POA: Diagnosis not present

## 2021-12-13 DIAGNOSIS — H401122 Primary open-angle glaucoma, left eye, moderate stage: Secondary | ICD-10-CM | POA: Diagnosis not present

## 2021-12-13 DIAGNOSIS — H18513 Endothelial corneal dystrophy, bilateral: Secondary | ICD-10-CM | POA: Diagnosis not present

## 2021-12-20 DIAGNOSIS — Z713 Dietary counseling and surveillance: Secondary | ICD-10-CM | POA: Diagnosis not present

## 2021-12-20 DIAGNOSIS — Z9884 Bariatric surgery status: Secondary | ICD-10-CM | POA: Diagnosis not present

## 2021-12-20 DIAGNOSIS — Z6827 Body mass index (BMI) 27.0-27.9, adult: Secondary | ICD-10-CM | POA: Diagnosis not present

## 2021-12-20 DIAGNOSIS — Z48815 Encounter for surgical aftercare following surgery on the digestive system: Secondary | ICD-10-CM | POA: Diagnosis not present

## 2021-12-20 DIAGNOSIS — K912 Postsurgical malabsorption, not elsewhere classified: Secondary | ICD-10-CM | POA: Diagnosis not present

## 2021-12-22 ENCOUNTER — Telehealth: Payer: Self-pay | Admitting: Podiatry

## 2021-12-22 NOTE — Telephone Encounter (Signed)
Patient stated that he needs a refill on his gabapentin '100mg'$ . He stated that Dr. Jacqualyn Posey stated that he would need to up the dosage to about 4 '100mg'$  tablets at night.  Please advise

## 2021-12-23 ENCOUNTER — Other Ambulatory Visit: Payer: Self-pay | Admitting: Podiatry

## 2021-12-23 MED ORDER — GABAPENTIN 100 MG PO CAPS: 400.0000 mg | ORAL_CAPSULE | Freq: Every day | ORAL | 2 refills | Status: DC

## 2021-12-26 DIAGNOSIS — E7800 Pure hypercholesterolemia, unspecified: Secondary | ICD-10-CM | POA: Diagnosis not present

## 2021-12-26 DIAGNOSIS — I10 Essential (primary) hypertension: Secondary | ICD-10-CM | POA: Diagnosis not present

## 2021-12-26 DIAGNOSIS — F331 Major depressive disorder, recurrent, moderate: Secondary | ICD-10-CM | POA: Diagnosis not present

## 2021-12-27 DIAGNOSIS — H25813 Combined forms of age-related cataract, bilateral: Secondary | ICD-10-CM | POA: Diagnosis not present

## 2021-12-27 DIAGNOSIS — H353131 Nonexudative age-related macular degeneration, bilateral, early dry stage: Secondary | ICD-10-CM | POA: Diagnosis not present

## 2021-12-27 DIAGNOSIS — H401111 Primary open-angle glaucoma, right eye, mild stage: Secondary | ICD-10-CM | POA: Diagnosis not present

## 2021-12-27 DIAGNOSIS — H401122 Primary open-angle glaucoma, left eye, moderate stage: Secondary | ICD-10-CM | POA: Diagnosis not present

## 2022-01-02 ENCOUNTER — Ambulatory Visit (INDEPENDENT_AMBULATORY_CARE_PROVIDER_SITE_OTHER): Payer: Medicare Other | Admitting: Orthopedic Surgery

## 2022-01-02 ENCOUNTER — Ambulatory Visit (INDEPENDENT_AMBULATORY_CARE_PROVIDER_SITE_OTHER): Payer: Medicare Other

## 2022-01-02 DIAGNOSIS — Z96652 Presence of left artificial knee joint: Secondary | ICD-10-CM

## 2022-01-09 DIAGNOSIS — N209 Urinary calculus, unspecified: Secondary | ICD-10-CM | POA: Diagnosis not present

## 2022-01-10 ENCOUNTER — Encounter: Payer: Self-pay | Admitting: Orthopedic Surgery

## 2022-01-10 NOTE — Progress Notes (Signed)
Office Visit Note   Patient: Francisco Padilla           Date of Birth: 1951-07-13           MRN: [id number] Visit Date: 01/02/2022              Requested by: Lajean Manes, MD [address] [place],  [place] [address] PCP: Lajean Manes, MD  Chief Complaint  Patient presents with   Left Knee - Follow-up    06/03/21 left knee replacement      HPI: Patient is a 71 year old gentleman who is 7 months status post left total knee arthroplasty.  He is currently going to the [facility] cycling about 5 times a week states he has pain with going downhill.  Has pain after exercising  Assessment & Plan: Visit Diagnoses:  1. History of left knee replacement     Plan: Recommended single-leg strengthening both isometric and dynamic.  Follow-Up Instructions: Return in about 4 weeks (around 01/30/2022).   Ortho Exam  Patient is alert, oriented, no adenopathy, well-dressed, normal affect, normal respiratory effort. Examination patient has weakness in the quads and hamstrings.  Varus and valgus stress is stable he has good range of motion of the knee there is no redness no cellulitis no effusion.  Imaging: No results found. No images are attached to the encounter.  Labs: Lab Results  Component Value Date   REPTSTATUS 10/23/2009 FINAL 10/21/2009   CULT NO GROWTH 10/21/2009     Lab Results  Component Value Date   ALBUMIN 4.1 10/22/2009   ALBUMIN 4.6 10/21/2009    No results found for: "MG" No results found for: "VD25OH"  No results found for: "PREALBUMIN"    Latest Ref Rng & Units 05/31/2021    3:46 PM 10/23/2009   10:15 AM 10/23/2009    1:35 AM  CBC EXTENDED  WBC 4.0 - 10.5 K/uL 6.8  6.2  6.3   RBC 4.22 - 5.81 MIL/uL 4.55  3.21  3.29   Hemoglobin 13.0 - 17.0 g/dL 14.0  10.4  10.6   HCT 39.0 - 52.0 % 41.8  30.3  31.1   Platelets 150 - 400 K/uL 188  194  202      There is no height or weight on file to calculate BMI.  Orders:  Orders Placed This Encounter   Procedures   XR Knee 1-2 Views Left   No orders of the defined types were placed in this encounter.    Procedures: No procedures performed  Clinical Data: No additional findings.  ROS:  All other systems negative, except as noted in the HPI. Review of Systems  Objective: Vital Signs: There were no vitals taken for this visit.  Specialty Comments:  No specialty comments available.  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   Unilateral primary osteoarthritis, left knee    Longstanding persistent atrial fibrillation ([place]) 04/26/2021   Preop cardiovascular exam 04/26/2021   Orthostatic hypotension 02/20/2019   Chronic anticoagulation 03/05/2015   Abnormal chest x-ray with multiple lung nodules 12/14/2014   AS (aortic stenosis) 12/14/2014   Atrial flutter by electrocardiogram ([place]) 12/14/2014   CAD (coronary artery disease) 12/14/2014   DJD (degenerative joint disease) 12/14/2014   History of GI bleed 12/14/2014   Nephrolithiasis 12/14/2014   Other activity(E029.9) 12/14/2014   Prediabetes 12/14/2014   Thrombocytopenia ([place]) 12/14/2014   Arterial blood pressure decreased 10/28/2014  Atrial fibrillation ([place]) 09/02/2014   Morbid obesity ([place]) 09/02/2014   AF (paroxysmal atrial fibrillation) ([place]) 08/03/2014   Gastroduodenal ulcer 08/03/2014   Apnea, sleep 08/03/2014   Glaucoma suspect 02/21/2013   Glaucoma suspect of both eyes 02/21/2013   Herpes 04/11/2012   Routine general medical examination at a health care facility 04/11/2012   DYSLIPIDEMIA 05/31/2009   DEPRESSION/ANXIETY 05/31/2009   ATTENTION DEFICIT DISORDER 05/31/2009   Obstructive sleep apnea 05/31/2009   GERD 05/31/2009   GOUT, HX OF 05/31/2009   Past Medical History:  Diagnosis Date   ADD (attention deficit disorder)    Anxiety 2007   Asthma as child   Asymptomatic gallstones    Atrial fibrillation (HCC)    Atrial  flutter (HCC)    Depression    DJD (degenerative joint disease) of knee    Dysrhythmia    Chronic Atrial Fib- seeing Dr. Lenna Sciara [facility]   H/O peptic ulcer    past history with GI bleed- cauterization done throught scope   History of kidney stones    pt claims urology discovered a kidney stone last week 05/25/21   Hypercholesterolemia    Orthostatic hypotension    Pneumonia as child   Pneumothorax on left 05/20/2009   Prediabetes    none now   Pulmonary nodules    Sleep apnea    cpap set on 13   Thrombocytopenia (HCC)    pt denies   Thyromegaly    Varicose veins     Family History  Problem Relation Age of Onset   Heart disease Mother    Heart failure Mother    Heart disease Father    Heart attack Father    Stroke Neg Hx     Past Surgical History:  Procedure Laterality Date   BIOPSY  04/18/2018   Procedure: BIOPSY;  Surgeon: Ronnette Juniper, MD;  [facility]: [facility];  Service: Gastroenterology;;   ESOPHAGOGASTRODUODENOSCOPY N/A 04/18/2018   Procedure: ESOPHAGOGASTRODUODENOSCOPY (EGD);  Surgeon: Ronnette Juniper, MD;  Location[facility];  Service: Gastroenterology;  Laterality: N/A;   ESOPHAGOGASTRODUODENOSCOPY (EGD) WITH PROPOFOL N/A 01/05/2015   Procedure: ESOPHAGOGASTRODUODENOSCOPY (EGD) WITH PROPOFOL;  Surgeon: Garlan Fair, MD;  [facility];  Service: Endoscopy;  Laterality: N/A;   EXTRACORPOREAL SHOCK WAVE LITHOTRIPSY Left 08/11/2021   Procedure: LEFT EXTRACORPOREAL SHOCK WAVE LITHOTRIPSY (ESWL);  Surgeon: Janith Lima, MD;  Location: [facility];  Service: Urology;  Laterality: Left;   GASTRIC ROUX-EN-Y     '08-[name] (weight stable around 302 #   KNEE ARTHROSCOPY     TONSILLECTOMY     TOTAL KNEE ARTHROPLASTY Left 06/03/2021   Procedure: LEFT TOTAL KNEE ARTHROPLASTY;  Surgeon: Newt Minion, MD;  Location: [facility];  Service: Orthopedics;  Laterality: Left;   ULNAR NERVE TRANSPOSITION Right 15 yrs ago   Social History    Occupational History   Not on file  Tobacco Use   Smoking status: Never   Smokeless tobacco: Never  Vaping Use   Vaping Use: Never used  Substance and Sexual Activity   Alcohol use: No    Alcohol/week: 0.0 standard drinks of alcohol   Drug use: Never   Sexual activity: Yes

## 2022-01-20 DIAGNOSIS — R195 Other fecal abnormalities: Secondary | ICD-10-CM | POA: Diagnosis not present

## 2022-01-26 ENCOUNTER — Telehealth: Payer: Self-pay | Admitting: *Deleted

## 2022-01-26 DIAGNOSIS — K2270 Barrett's esophagus without dysplasia: Secondary | ICD-10-CM | POA: Diagnosis not present

## 2022-01-26 DIAGNOSIS — R195 Other fecal abnormalities: Secondary | ICD-10-CM | POA: Diagnosis not present

## 2022-01-26 DIAGNOSIS — Z9884 Bariatric surgery status: Secondary | ICD-10-CM | POA: Diagnosis not present

## 2022-01-26 DIAGNOSIS — D649 Anemia, unspecified: Secondary | ICD-10-CM | POA: Diagnosis not present

## 2022-01-26 DIAGNOSIS — I482 Chronic atrial fibrillation, unspecified: Secondary | ICD-10-CM | POA: Diagnosis not present

## 2022-01-26 NOTE — Telephone Encounter (Signed)
Left message for the pt to call back for tele visit.  The pt will need to be added on to tomorrow so that the provider may advise about holding blood thinner in time.

## 2022-01-26 NOTE — Telephone Encounter (Signed)
   Name: Francisco Padilla  DOB: 12/25/1950  MRN: [id number]  Primary Cardiologist: Candee Furbish, MD   Preoperative team, please contact this patient and set up a phone call appointment for further preoperative risk assessment. Please obtain consent and complete medication review. Thank you for your help.  I confirm that guidance regarding antiplatelet and oral anticoagulation therapy has been completed and, if necessary, noted below.  Patient with diagnosis of afib on Eliquis for anticoagulation.     Procedure: colonoscopy Date of procedure: 02/01/22   CHA2DS2-VASc Score = 2  This indicates a 2.2% annual risk of stroke. The patient's score is based upon: CHF History: 0 HTN History: 0 Diabetes History: 0 Stroke History: 0 Vascular Disease History: 1 Age Score: 1 Gender Score: 0   HTN listed in office notes but BP consistently normal and on no BP meds, will not count   CrCl >165m/min Platelet count 195K   Per office protocol, patient can hold Eliquis for 2 days prior to procedure as requested.    ELenna Sciara NP 01/26/2022, 3:58 PM CK[id number] Fair[address] StreetSMiami GardensGHayward [place] [address]

## 2022-01-26 NOTE — Telephone Encounter (Signed)
Patient with diagnosis of afib on Eliquis for anticoagulation.    Procedure: colonoscopy Date of procedure: 02/01/22  CHA2DS2-VASc Score = 2  This indicates a 2.2% annual risk of stroke. The patient's score is based upon: CHF History: 0 HTN History: 0 Diabetes History: 0 Stroke History: 0 Vascular Disease History: 1 Age Score: 1 Gender Score: 0   HTN listed in office notes but BP consistently normal and on no BP meds, will not count  CrCl >11m/min Platelet count 195K  Per office protocol, patient can hold Eliquis for 2 days prior to procedure as requested.    **This guidance is not considered finalized until pre-operative APP has relayed final recommendations.**

## 2022-01-26 NOTE — Telephone Encounter (Signed)
   Pre-operative Risk Assessment    Patient Name: Francisco Padilla  DOB: Jan 06, 1951 MRN: [id number]      Request for Surgical Clearance    Procedure:   COLONOSCOPY ; + FIT TEST  Date of Surgery:  Clearance 02/01/22  URGENT                             Surgeon:  Deliah Goody, [facility] Surgeon's Group or Practice Name:  [facility] Phone number:  [phone] Fax number:  [phone]   Type of Clearance Requested:   - Medical  - Pharmacy:  Hold Apixaban (Eliquis) x 2 DAYS PRIOR   Type of Anesthesia:   PROPOFOL   Additional requests/questions:    [name]   01/26/2022, 1:03 PM

## 2022-01-27 ENCOUNTER — Ambulatory Visit (INDEPENDENT_AMBULATORY_CARE_PROVIDER_SITE_OTHER): Payer: Medicare Other | Admitting: Nurse Practitioner

## 2022-01-27 ENCOUNTER — Telehealth: Payer: Self-pay | Admitting: *Deleted

## 2022-01-27 DIAGNOSIS — Z01810 Encounter for preprocedural cardiovascular examination: Secondary | ICD-10-CM | POA: Diagnosis not present

## 2022-01-27 NOTE — Telephone Encounter (Signed)
I will send FYI to requesting office that we have tried to reach the pt x 2 for tele visit for pre op assessment, though unable to reach pt.

## 2022-01-27 NOTE — Progress Notes (Signed)
Virtual Visit via Telephone Note   Because of Francisco Padilla's co-morbid illnesses, he is at least at moderate risk for complications without adequate follow up.  This format is felt to be most appropriate for this patient at this time.  The patient did not have access to video technology/had technical difficulties with video requiring transitioning to audio format only (telephone).  All issues noted in this document were discussed and addressed.  No physical exam could be performed with this format.  Please refer to the patient's chart for his consent to telehealth for [facility].  Evaluation Performed:  Preoperative cardiovascular risk assessment _____________   Date:  01/27/2022   Patient ID:  Francisco Padilla, DOB November 04, 1950, MRN [id number] Patient Location:  Home Provider location:   Office  Primary Care Provider:  Lajean Manes, MD Primary Cardiologist:  Francisco Furbish, MD  Chief Complaint / Patient Profile   71 y.o. y/o male with a h/o longstanding persistent atrial fibrillation, hyperlipidemia, and orthostatic hypotension, who is pending colonoscopy on 02/01/2022 with Deliah Goody, PA-C of [facility] and presents today for telephonic preoperative cardiovascular risk assessment.  Past Medical History    Past Medical History:  Diagnosis Date   ADD (attention deficit disorder)    Anxiety 2007   Asthma as child   Asymptomatic gallstones    Atrial fibrillation (HCC)    Atrial flutter (HCC)    Depression    DJD (degenerative joint disease) of knee    Dysrhythmia    Chronic Atrial Fib- seeing Dr. Lenna Sciara [facility]   H/O peptic ulcer    past history with GI bleed- cauterization done throught scope   History of kidney stones    pt claims urology discovered a kidney stone last week 05/25/21   Hypercholesterolemia    Orthostatic hypotension    Pneumonia as child   Pneumothorax on left 05/20/2009   Prediabetes    none now   Pulmonary nodules    Sleep apnea     cpap set on 13   Thrombocytopenia ([place])    pt denies   Thyromegaly    Varicose veins    Past Surgical History:  Procedure Laterality Date   BIOPSY  04/18/2018   Procedure: BIOPSY;  Surgeon: Ronnette Juniper, MD;  Location: [facility];  Service: Gastroenterology;;   ESOPHAGOGASTRODUODENOSCOPY N/A 04/18/2018   Procedure: ESOPHAGOGASTRODUODENOSCOPY (EGD);  Surgeon: Ronnette Juniper, MD;  Location: [facility];  Service: Gastroenterology;  Laterality: N/A;   ESOPHAGOGASTRODUODENOSCOPY (EGD) WITH PROPOFOL N/A 01/05/2015   Procedure: ESOPHAGOGASTRODUODENOSCOPY (EGD) WITH PROPOFOL;  Surgeon: Garlan Fair, MD;  [facility];  Service: Endoscopy;  Laterality: N/A;   EXTRACORPOREAL SHOCK WAVE LITHOTRIPSY Left 08/11/2021   Procedure: LEFT EXTRACORPOREAL SHOCK WAVE LITHOTRIPSY (ESWL);  Surgeon: Janith Lima, MD;  [facility];  Service: Urology;  Laterality: Left;   GASTRIC ROUX-EN-Y     '08-[name] (weight stable around 302 #   KNEE ARTHROSCOPY     TONSILLECTOMY     TOTAL KNEE ARTHROPLASTY Left 06/03/2021   Procedure: LEFT TOTAL KNEE ARTHROPLASTY;  Surgeon: Newt Minion, MD;  Location: [place];  Service: Orthopedics;  Laterality: Left;   ULNAR NERVE TRANSPOSITION Right 15 yrs ago    Allergies  Allergies  Allergen Reactions   Aspirin     Avoid due to bariatric surgery   Nsaids     Avoid due to bariatric surgery   Oxycodone Hcl     Hallucinations, agitation     History of  Present Illness    Francisco Padilla is a 71 y.o. male who presents via audio/video conferencing for a telehealth visit today.  Pt was last seen in cardiology clinic on 04/26/2021 by Dr. Marlou Porch.  At that time Francisco Padilla was doing well.  The patient is now pending procedure as outlined above. Since his last visit, he has done well from a cardiac standpoint. He denies chest pain, palpitations, dyspnea, pnd, orthopnea, n, v, dizziness, syncope, edema, weight gain, or early satiety. All other systems  reviewed and are otherwise negative except as noted above.   Home Medications    Prior to Admission medications   Medication Sig Start Date End Date Taking? Authorizing Provider  acetaminophen (TYLENOL) 650 MG CR tablet Take 1,300 mg by mouth every 8 (eight) hours as needed for pain.    [provider]  atorvastatin (LIPITOR) 20 MG tablet Take 20 mg by mouth at bedtime. 04/03/21   [provider]  Calcium Citrate-Vitamin D (CALCIUM CITRATE + D PO) Take 1 tablet by mouth 2 (two) times daily.    [provider]  Cyanocobalamin (B-12 SL) Place 1 tablet under the tongue daily.    [provider]  diphenhydrAMINE (BENADRYL) 25 MG tablet Take 12.5-25 mg by mouth at bedtime.    [provider]  ELIQUIS 5 MG TABS tablet Take 5 mg by mouth 2 (two) times daily.  02/15/15   [provider]  fludrocortisone (FLORINEF) 0.1 MG tablet Take 0.1 mg by mouth daily. 03/07/19   [provider]  gabapentin (NEURONTIN) 100 MG capsule Take 4 capsules (400 mg total) by mouth at bedtime. 12/23/21   Trula Slade, DPM  latanoprost (XALATAN) 0.005 % ophthalmic solution Place 1 drop into both eyes at bedtime. 04/23/21   [provider]  LORazepam (ATIVAN) 1 MG tablet Take 0.5 mg by mouth every 4 (four) hours.    [provider]  Multiple Vitamins-Minerals (ONE DAILY MULTIVITAMIN MEN) TABS Take 1 tablet by mouth 2 (two) times daily. 04/13/06   [provider]  pantoprazole (PROTONIX) 40 MG tablet Take 80 mg by mouth at bedtime.  10/24/09   [provider]  Polyethyl Glycol-Propyl Glycol (SYSTANE OP) Place 1 drop into both eyes daily as needed (dry eyes).    [provider]  Probiotic Product (PROBIOTIC PO) Take 1 capsule by mouth daily.    [provider]  sertraline (ZOLOFT) 100 MG tablet Take 100 mg by mouth at bedtime.    [provider]  SUPER B COMPLEX/C PO Take 1 tablet by mouth daily.    [provider]  zolpidem (AMBIEN) 10 MG tablet Take 5-10 mg by mouth at bedtime. 08/12/14   [provider]    Physical Exam    Vital Signs:  Filomena Jungling does not have vital signs available for review today.  Given telephonic nature of communication, physical exam is limited. AAOx3. NAD. Normal affect.  Speech and respirations are unlabored.  Accessory Clinical Findings    None  Assessment & Plan    1.  Preoperative Cardiovascular Risk Assessment:  According to the Revised Cardiac Risk Index (RCRI), his Perioperative Risk of Major Cardiac Event is (%): 0.4. His Functional Capacity in METs is: 6.45 according to the [name] Activity Status Index (DASI). Therefore, based on ACC/AHA guidelines, patient would be at acceptable risk for the planned procedure without further cardiovascular testing.   Patient with diagnosis of afib on Eliquis for anticoagulation.  Procedure: colonoscopy Date of procedure: 02/01/22   CHA2DS2-VASc Score = 2  This indicates a 2.2% annual risk of stroke. The patient's score is based upon: CHF History: 0 HTN History: 0 Diabetes History: 0 Stroke History: 0 Vascular Disease History: 1 Age Score: 1 Gender Score: 0   HTN listed in office notes but BP consistently normal and on no BP meds, will not count   CrCl >117m/min Platelet count 195K   Per office protocol, patient can hold Eliquis for 2 days prior to procedure as requested.  Please resume Eliquis as soon as possible postprocedure, at the discretion of the surgeon.   A copy of this note will be routed to requesting surgeon.  Time:   Today, I have spent 10 minutes with the patient with telehealth technology discussing medical history, symptoms, and management plan.     ELenna Sciara NP  01/27/2022, 9:03 AM

## 2022-01-27 NOTE — Telephone Encounter (Signed)
Left message x 2 for the pt to call for tele appt today

## 2022-01-27 NOTE — Telephone Encounter (Signed)
  Patient Consent for Virtual Visit        [name] has provided verbal consent on 01/27/2022 for a virtual visit (video or telephone).   CONSENT FOR VIRTUAL VISIT FOR:  Francisco Padilla  By participating in this virtual visit I agree to the following:  I hereby voluntarily request, consent and authorize [facility] and its employed or contracted physicians, physician assistants, nurse practitioners or other licensed health care professionals (the Practitioner), to provide me with telemedicine health care services (the "Services") as deemed necessary by the treating Practitioner. I acknowledge and consent to receive the Services by the Practitioner via telemedicine. I understand that the telemedicine visit will involve communicating with the Practitioner through live audiovisual communication technology and the disclosure of certain medical information by electronic transmission. I acknowledge that I have been given the opportunity to request an in-person assessment or other available alternative prior to the telemedicine visit and am voluntarily participating in the telemedicine visit.  I understand that I have the right to withhold or withdraw my consent to the use of telemedicine in the course of my care at any time, without affecting my right to future care or treatment, and that the Practitioner or I may terminate the telemedicine visit at any time. I understand that I have the right to inspect all information obtained and/or recorded in the course of the telemedicine visit and may receive copies of available information for a reasonable fee.  I understand that some of the potential risks of receiving the Services via telemedicine include:  Delay or interruption in medical evaluation due to technological equipment failure or disruption; Information transmitted may not be sufficient (e.g. poor resolution of images) to allow for appropriate medical decision making by the Practitioner; and/or   In rare instances, security protocols could fail, causing a breach of personal health information.  Furthermore, I acknowledge that it is my responsibility to provide information about my medical history, conditions and care that is complete and accurate to the best of my ability. I acknowledge that Practitioner's advice, recommendations, and/or decision may be based on factors not within their control, such as incomplete or inaccurate data provided by me or distortions of diagnostic images or specimens that may result from electronic transmissions. I understand that the practice of medicine is not an exact science and that Practitioner makes no warranties or guarantees regarding treatment outcomes. I acknowledge that a copy of this consent can be made available to me via my patient portal ([facility]), or I can request a printed copy by calling the office of [facility].    I understand that my insurance will be billed for this visit.   I have read or had this consent read to me. I understand the contents of this consent, which adequately explains the benefits and risks of the Services being provided via telemedicine.  I have been provided ample opportunity to ask questions regarding this consent and the Services and have had my questions answered to my satisfaction. I give my informed consent for the services to be provided through the use of telemedicine in my medical care

## 2022-01-27 NOTE — Telephone Encounter (Signed)
Pt called back and he has been scheduled today at 9 am for tele visit for pre op clearance. Consent done, PAC will need to go over medications with the pt as we just added him on now.

## 2022-02-01 DIAGNOSIS — D123 Benign neoplasm of transverse colon: Secondary | ICD-10-CM | POA: Diagnosis not present

## 2022-02-01 DIAGNOSIS — K2270 Barrett's esophagus without dysplasia: Secondary | ICD-10-CM | POA: Diagnosis not present

## 2022-02-01 DIAGNOSIS — K2930 Chronic superficial gastritis without bleeding: Secondary | ICD-10-CM | POA: Diagnosis not present

## 2022-02-01 DIAGNOSIS — Z9884 Bariatric surgery status: Secondary | ICD-10-CM | POA: Diagnosis not present

## 2022-02-01 DIAGNOSIS — K219 Gastro-esophageal reflux disease without esophagitis: Secondary | ICD-10-CM | POA: Diagnosis not present

## 2022-02-01 DIAGNOSIS — D125 Benign neoplasm of sigmoid colon: Secondary | ICD-10-CM | POA: Diagnosis not present

## 2022-02-01 DIAGNOSIS — K648 Other hemorrhoids: Secondary | ICD-10-CM | POA: Diagnosis not present

## 2022-02-01 DIAGNOSIS — D120 Benign neoplasm of cecum: Secondary | ICD-10-CM | POA: Diagnosis not present

## 2022-02-01 DIAGNOSIS — R1310 Dysphagia, unspecified: Secondary | ICD-10-CM | POA: Diagnosis not present

## 2022-02-01 DIAGNOSIS — D649 Anemia, unspecified: Secondary | ICD-10-CM | POA: Diagnosis not present

## 2022-02-01 DIAGNOSIS — D122 Benign neoplasm of ascending colon: Secondary | ICD-10-CM | POA: Diagnosis not present

## 2022-02-01 DIAGNOSIS — Z1211 Encounter for screening for malignant neoplasm of colon: Secondary | ICD-10-CM | POA: Diagnosis not present

## 2022-02-03 DIAGNOSIS — D125 Benign neoplasm of sigmoid colon: Secondary | ICD-10-CM | POA: Diagnosis not present

## 2022-02-03 DIAGNOSIS — K2930 Chronic superficial gastritis without bleeding: Secondary | ICD-10-CM | POA: Diagnosis not present

## 2022-02-03 DIAGNOSIS — K219 Gastro-esophageal reflux disease without esophagitis: Secondary | ICD-10-CM | POA: Diagnosis not present

## 2022-02-03 DIAGNOSIS — D123 Benign neoplasm of transverse colon: Secondary | ICD-10-CM | POA: Diagnosis not present

## 2022-02-08 DIAGNOSIS — W19XXXA Unspecified fall, initial encounter: Secondary | ICD-10-CM | POA: Diagnosis not present

## 2022-02-08 DIAGNOSIS — R2689 Other abnormalities of gait and mobility: Secondary | ICD-10-CM | POA: Diagnosis not present

## 2022-02-08 DIAGNOSIS — K635 Polyp of colon: Secondary | ICD-10-CM | POA: Diagnosis not present

## 2022-02-08 DIAGNOSIS — D649 Anemia, unspecified: Secondary | ICD-10-CM | POA: Diagnosis not present

## 2022-02-08 DIAGNOSIS — S0081XA Abrasion of other part of head, initial encounter: Secondary | ICD-10-CM | POA: Diagnosis not present

## 2022-02-17 DIAGNOSIS — N5201 Erectile dysfunction due to arterial insufficiency: Secondary | ICD-10-CM | POA: Diagnosis not present

## 2022-02-20 ENCOUNTER — Ambulatory Visit: Payer: Medicare Other | Admitting: Physical Therapy

## 2022-02-20 DIAGNOSIS — H18513 Endothelial corneal dystrophy, bilateral: Secondary | ICD-10-CM | POA: Diagnosis not present

## 2022-02-20 DIAGNOSIS — H353131 Nonexudative age-related macular degeneration, bilateral, early dry stage: Secondary | ICD-10-CM | POA: Diagnosis not present

## 2022-02-20 DIAGNOSIS — H25813 Combined forms of age-related cataract, bilateral: Secondary | ICD-10-CM | POA: Diagnosis not present

## 2022-02-20 DIAGNOSIS — H401122 Primary open-angle glaucoma, left eye, moderate stage: Secondary | ICD-10-CM | POA: Diagnosis not present

## 2022-02-20 DIAGNOSIS — H401111 Primary open-angle glaucoma, right eye, mild stage: Secondary | ICD-10-CM | POA: Diagnosis not present

## 2022-02-22 ENCOUNTER — Encounter: Payer: Self-pay | Admitting: Physical Therapy

## 2022-02-22 ENCOUNTER — Other Ambulatory Visit: Payer: Self-pay

## 2022-02-22 ENCOUNTER — Ambulatory Visit: Payer: Medicare Other | Attending: Internal Medicine | Admitting: Physical Therapy

## 2022-02-22 DIAGNOSIS — Z9181 History of falling: Secondary | ICD-10-CM | POA: Diagnosis not present

## 2022-02-22 DIAGNOSIS — M6281 Muscle weakness (generalized): Secondary | ICD-10-CM | POA: Diagnosis not present

## 2022-02-22 DIAGNOSIS — R2689 Other abnormalities of gait and mobility: Secondary | ICD-10-CM | POA: Diagnosis not present

## 2022-02-22 NOTE — Patient Instructions (Signed)
Access Code: [email or web address] URL: [email or web address] Date: 02/22/2022 Prepared by: Hilda Blades  Exercises - Supine Quadriceps Stretch with Strap on Table  - 2-3 x daily - 7 x weekly - 1 sets - 2-3 reps - 30 seconds hold - Standing Gastroc Stretch on Step with Counter Support  - 2-3 x daily - 7 x weekly - 1 sets - 2-3 reps - 30 seconds hold - Seated Hamstring Stretch with Strap  - 2-3 x daily - 7 x weekly - 1 sets - 2-3 reps - 30 seconds hold - Mini Squat with Counter Support  - 1 x daily - 7 x weekly - 3 sets - 10 reps - Half Deadlift with Kettlebell  - 1 x daily - 7 x weekly - 3 sets - 10 reps - Full Leg Press  - 1 x daily - 7 x weekly - 3 sets - 10 reps - Single Leg Knee Extension with Weight Machine  - 1 x daily - 7 x weekly - 3 sets - 10 reps - Single Leg Hamstring Curl with Weight Machine  - 1 x daily - 7 x weekly - 3 sets - 10 reps - Standing Tandem Balance with Counter Support  - 3 x daily - 3 reps - 30 seconds hold

## 2022-02-22 NOTE — Therapy (Signed)
OUTPATIENT PHYSICAL THERAPY EVALUATION   Patient Name: Francisco Padilla MRN: [id number] DOB:August 19, 1950, 71 y.o., male Today's Date: 02/22/2022   PT End of Session - 02/22/22 1338     Visit Number 1    Number of Visits 9    Date for PT Re-Evaluation 04/19/22    Authorization Type MCR    Authorization Time Period FOTO by 6th, KX by 15th    Progress Note Due on Visit 10    PT Start Time 1400    PT Stop Time 1445    PT Time Calculation (min) 45 min    Equipment Utilized During Treatment Gait belt    Activity Tolerance Patient tolerated treatment well    Behavior During Therapy WFL for tasks assessed/performed             Past Medical History:  Diagnosis Date   ADD (attention deficit disorder)    Anxiety 2007   Asthma as child   Asymptomatic gallstones    Atrial fibrillation ([place])    Atrial flutter (HCC)    Depression    DJD (degenerative joint disease) of knee    Dysrhythmia    Chronic Atrial Fib- seeing Dr. Lenna Sciara [facility]   H/O peptic ulcer    past history with GI bleed- cauterization done throught scope   History of kidney stones    pt claims urology discovered a kidney stone last week 05/25/21   Hypercholesterolemia    Orthostatic hypotension    Pneumonia as child   Pneumothorax on left 05/20/2009   Prediabetes    none now   Pulmonary nodules    Sleep apnea    cpap set on 13   Thrombocytopenia ([place])    pt denies   Thyromegaly    Varicose veins    Past Surgical History:  Procedure Laterality Date   BIOPSY  04/18/2018   Procedure: BIOPSY;  Surgeon: Ronnette Juniper, MD;  [facility];  Service: Gastroenterology;;   ESOPHAGOGASTRODUODENOSCOPY N/A 04/18/2018   Procedure: ESOPHAGOGASTRODUODENOSCOPY (EGD);  Surgeon: Ronnette Juniper, MD;  Location: [facility];  Service: Gastroenterology;  Laterality: N/A;   ESOPHAGOGASTRODUODENOSCOPY (EGD) WITH PROPOFOL N/A 01/05/2015   Procedure: ESOPHAGOGASTRODUODENOSCOPY (EGD) WITH PROPOFOL;  Surgeon: Garlan Fair,  MD;  [facility];  Service: Endoscopy;  Laterality: N/A;   EXTRACORPOREAL SHOCK WAVE LITHOTRIPSY Left 08/11/2021   Procedure: LEFT EXTRACORPOREAL SHOCK WAVE LITHOTRIPSY (ESWL);  Surgeon: Janith Lima, MD;  Location[facility];  Service: Urology;  Laterality: Left;   GASTRIC ROUX-EN-Y     '08-[name] (weight stable around 302 #   KNEE ARTHROSCOPY     TONSILLECTOMY     TOTAL KNEE ARTHROPLASTY Left 06/03/2021   Procedure: LEFT TOTAL KNEE ARTHROPLASTY;  Surgeon: Newt Minion, MD;  Location: [place];  Service: Orthopedics;  Laterality: Left;   ULNAR NERVE TRANSPOSITION Right 15 yrs ago   Patient Active Problem List   Diagnosis Date Noted   Total knee replacement status, 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   Unilateral primary osteoarthritis, left knee    Longstanding persistent atrial fibrillation ([place]) 04/26/2021   Preop cardiovascular exam 04/26/2021   Orthostatic hypotension 02/20/2019   Chronic anticoagulation 03/05/2015   Abnormal chest x-ray with multiple lung nodules 12/14/2014   AS (aortic stenosis) 12/14/2014   Atrial flutter by electrocardiogram ([place]) 12/14/2014   CAD (coronary artery disease) 12/14/2014   DJD (degenerative joint disease) 12/14/2014   History of GI bleed 12/14/2014   Nephrolithiasis 12/14/2014   Other activity(E029.9) 12/14/2014   Prediabetes 12/14/2014  Thrombocytopenia ([place]) 12/14/2014   Arterial blood pressure decreased 10/28/2014   Atrial fibrillation ([place]) 09/02/2014   Morbid obesity ([place]) 09/02/2014   AF (paroxysmal atrial fibrillation) ([place]) 08/03/2014   Gastroduodenal ulcer 08/03/2014   Apnea, sleep 08/03/2014   Glaucoma suspect 02/21/2013   Glaucoma suspect of both eyes 02/21/2013   Herpes 04/11/2012   Routine general medical examination at a health care facility 04/11/2012   DYSLIPIDEMIA 05/31/2009   DEPRESSION/ANXIETY 05/31/2009   ATTENTION DEFICIT DISORDER 05/31/2009   Obstructive sleep apnea 05/31/2009   GERD 05/31/2009   GOUT, HX  OF 05/31/2009    PCP: Lajean Manes, MD  REFERRING PROVIDER: Kathalene Frames, MD  REFERRING DIAG: Unspecified fall, Other abnormalities of gait and mobility  THERAPY DIAG:  Other abnormalities of gait and mobility  Muscle weakness (generalized)  History of falling  Rationale for Evaluation and Treatment Rehabilitation  ONSET DATE: ongoing since left TKA 06/03/2021   SUBJECTIVE:  SUBJECTIVE STATEMENT: Patient reports he had a knee replacement in 05/2021 and had therapy but did not improve his balance. He does note having some falls recently and feels he is walking at an angle. He does go to the [facility] about 5x/week for cycling and swimming. He does note some aching and he takes Tylenol for arthritis. He also reports he needs cataract surgery for both eyes. He does have stairs at home and states he needs to use the railing when negotiating the stairs.  PERTINENT HISTORY: Left TKA 06/03/2021, ADD, OCD, anxiety, depression  PAIN:  Are you having pain? No  PRECAUTIONS: Fall  WEIGHT BEARING RESTRICTIONS No  FALLS:  Has patient fallen in last 6 months? Yes. Number of falls 3-4  LIVING ENVIRONMENT: Lives with: lives alone Lives in: House/apartment Stairs: Yes: Internal: 1 flight steps; on right going up Has following equipment at home: Grab bars  OCCUPATION: Retired  PLOF: Independent  PATIENT GOALS: Improve balance and falls   OBJECTIVE:  PATIENT SURVEYS:  FOTO 49% functional status  COGNITION: Overall cognitive status: Within functional limits for tasks assessed     SENSATION: Patient reports numbness due to neuropathy  MUSCLE LENGTH: Not assessed  POSTURE:   Rounded shoulder posture  PALPATION: Not assessed  LOWER EXTREMITY ROM:   Not assessed  LOWER EXTREMITY MMT:  MMT Right eval Left eval  Hip flexion 4 4  Hip extension 4- 4-  Hip abduction 4- 4-  Knee flexion 5 5  Knee extension 5 5  Ankle dorsiflexion 5 5  Ankle plantarflexion 4  4   FUNCTIONAL TESTS:  5 times sit to stand: 14 seconds 6 minute walk test: 1235 ft Berg Balance Scale: 52/56  BERG BALANCE TEST Sitting to Standing: 4.      Stands without using hands and stabilize independently Standing Unsupported: 4.      Stands safely for 2 minutes Sitting Unsupported: 4.     Sits for 2 minutes independently Standing to Sitting: 4.     Sits safely with minimal use of hands Transfers: 4.     Transfers safely with minor use of hands Standing with eyes closed: 4.     Stands safely for 10 seconds  Standing with feet together: 4.     Stands for 1 minute safely Reaching forward with outstretched arm: 4.     Reaches forward 10 inches Retrieving object from the floor: 4.      Able to pick up easily and safely Turning to look behind: 4.     Looks behind from  both sides and weight shifts well Turning 360 degrees: 4.     Able to turn in </=4 seconds  Place alternate foot on stool: 4.     Completes 8 steps in 20 seconds     Standing with one foot in front: 2.     Independent small step for 30 seconds Standing on one foot: 2.     Holds >/=3 seconds Total Score: 52/56  GAIT: Distance walked: 1235 Assistive device utilized: None Level of assistance: Complete Independence Comments: patient with left trunk lean, trendelenburg on leg, slightly antalgic on right, patient with occasional unsteady gait with one instance of poor foot clearance on left with stumble   TODAY'S TREATMENT: Tandem stance 3 x 30 sec each   PATIENT EDUCATION:  Education details: Exam findings, POC, HEP Person educated: Patient Education method: Explanation, Demonstration, Tactile cues, Verbal cues, and Handouts Education comprehension: verbalized understanding, returned demonstration, verbal cues required, tactile cues required, and needs further education  HOME EXERCISE PROGRAM: Access Code: [email or web address]    ASSESSMENT: CLINICAL IMPRESSION: Patient is a 71 y.o. male who was seen today for physical  therapy evaluation and treatment for balance, gait abnormalities, and history of falls. He demonstrates gross strength deficits of LE and gait deviations with decreased walking tolerance. He does exhibit slightly increased fall risk with balance deficits based on 5xSTS and BERG.   OBJECTIVE IMPAIRMENTS Abnormal gait, decreased activity tolerance, decreased balance, decreased strength, impaired sensation, and pain.   ACTIVITY LIMITATIONS stairs and locomotion level  PARTICIPATION LIMITATIONS: shopping and community activity  PERSONAL FACTORS Behavior pattern, Past/current experiences, Time since onset of injury/illness/exacerbation, and 3+ comorbidities: see above  are also affecting patient's functional outcome.   REHAB POTENTIAL: Good  CLINICAL DECISION MAKING: Stable/uncomplicated  EVALUATION COMPLEXITY: Low   GOALS: Goals reviewed with patient? Yes  SHORT TERM GOALS: Target date: 03/22/2022   Patient will be I with initial HEP in order to progress with therapy. Baseline: HEP provided at eval Goal status: INITIAL  2.  PT will review FOTO with patient by 3rd visit in order to understand expected progress and outcome with therapy. Baseline: FOTO assessed at eval Goal status: INITIAL  3.  Patient will demonstrate 5xSTS </= 12 sec in order to indicate improved LE strength and reduced fall risk Baseline: 14 sec Goal status: INITIAL  LONG TERM GOALS: Target date: 04/19/2022   Patient will be I with final HEP to maintain progress from PT. Baseline: HEP provided at eval Goal status: INITIAL  2.  Patient will report >/= 63% status on FOTO to indicate improved functional ability. Baseline: 49% functional status Goal status: INITIAL  3.  Patient will perform 6MWT >/= 1635 ft in order to improve community access Baseline: 1235 ft Goal status: INITIAL  4.  Patient will demonstrate BERG >/= 55/56 in order to indicate improved balance and reduced fall risk Baseline: 52/56 Goal  status: INITIAL   PLAN: PT FREQUENCY: 1x/week  PT DURATION: 8 weeks  PLANNED INTERVENTIONS: Therapeutic exercises, Therapeutic activity, Neuromuscular re-education, Balance training, Gait training, Patient/Family education, Self Care, Joint mobilization, Stair training, Aquatic Therapy, Dry Needling, Cryotherapy, Moist heat, Manual therapy, and Re-evaluation  PLAN FOR NEXT SESSION: Review HEP and progress PRN, initiate hip and LE strengthening progressing to closed chair exercises such as step-ups, balance training progressing to dynamic   Hilda Blades, PT, DPT, LAT, ATC 02/22/22  5:18 PM Phone: [phone] Fax: [phone]

## 2022-03-06 ENCOUNTER — Ambulatory Visit (INDEPENDENT_AMBULATORY_CARE_PROVIDER_SITE_OTHER): Payer: Medicare Other | Admitting: Podiatry

## 2022-03-06 DIAGNOSIS — M79674 Pain in right toe(s): Secondary | ICD-10-CM

## 2022-03-06 DIAGNOSIS — M79675 Pain in left toe(s): Secondary | ICD-10-CM

## 2022-03-06 DIAGNOSIS — Z9884 Bariatric surgery status: Secondary | ICD-10-CM | POA: Diagnosis not present

## 2022-03-06 DIAGNOSIS — G629 Polyneuropathy, unspecified: Secondary | ICD-10-CM

## 2022-03-06 DIAGNOSIS — D509 Iron deficiency anemia, unspecified: Secondary | ICD-10-CM | POA: Diagnosis not present

## 2022-03-06 DIAGNOSIS — Z86010 Personal history of colonic polyps: Secondary | ICD-10-CM | POA: Diagnosis not present

## 2022-03-06 DIAGNOSIS — K2270 Barrett's esophagus without dysplasia: Secondary | ICD-10-CM | POA: Diagnosis not present

## 2022-03-06 DIAGNOSIS — B351 Tinea unguium: Secondary | ICD-10-CM | POA: Diagnosis not present

## 2022-03-07 DIAGNOSIS — I10 Essential (primary) hypertension: Secondary | ICD-10-CM | POA: Diagnosis not present

## 2022-03-07 DIAGNOSIS — E7800 Pure hypercholesterolemia, unspecified: Secondary | ICD-10-CM | POA: Diagnosis not present

## 2022-03-07 DIAGNOSIS — F331 Major depressive disorder, recurrent, moderate: Secondary | ICD-10-CM | POA: Diagnosis not present

## 2022-03-08 NOTE — Progress Notes (Signed)
Subjective: 71 y.o. returns the office today for painful, elongated, thickened toenails which he cannot trim himself.  Still on gabapentin for neuropathy.  Denies any new concerns.  No open lesions.  PCP: Lajean Manes, MD   Objective: AAO 3, NAD DP/PT pulses palpable, CRT less than 3 seconds Sensation decreased. Nails hypertrophic, dystrophic, elongated, brittle, discolored 10. There is tenderness overlying the nails 1-5 bilaterally. There is no surrounding erythema or drainage along the nail sites. No pain with calf compression, swelling, warmth, erythema.  Assessment: Patient presents with symptomatic onychomycosis, neuropathy  Plan: -Treatment options including alternatives, risks, complications were discussed -Nails sharply debrided 10 without complication/bleeding. -Continue gabapentin.  -Discussed daily foot inspection. If there are any changes, to call the office immediately.  -Follow-up in 3 months or sooner if any problems are to arise. In the meantime, encouraged to call the office with any questions, concerns, changes symptoms.  Celesta Gentile, DPM

## 2022-03-08 NOTE — Therapy (Signed)
OUTPATIENT PHYSICAL THERAPY TREATMENT NOTE   Patient Name: Francisco Padilla MRN: [id number] DOB:17-Feb-1951, 71 y.o., male Today's Date: 03/09/2022  PCP: Lajean Manes, MD   REFERRING PROVIDER: Kathalene Frames, MD   END OF SESSION:   PT End of Session - 03/09/22 1409     Visit Number 2    Number of Visits 9    Date for PT Re-Evaluation 04/19/22    Authorization Type MCR    Authorization Time Period FOTO by 6th, KX by 15th    Progress Note Due on Visit 10    PT Start Time 1401    PT Stop Time 1445    PT Time Calculation (min) 44 min    Activity Tolerance Patient tolerated treatment well    Behavior During Therapy [facility] for tasks assessed/performed             Past Medical History:  Diagnosis Date   ADD (attention deficit disorder)    Anxiety 2007   Asthma as child   Asymptomatic gallstones    Atrial fibrillation (HCC)    Atrial flutter (HCC)    Depression    DJD (degenerative joint disease) of knee    Dysrhythmia    Chronic Atrial Fib- seeing Dr. Lenna Sciara [facility]   H/O peptic ulcer    past history with GI bleed- cauterization done throught scope   History of kidney stones    pt claims urology discovered a kidney stone last week 05/25/21   Hypercholesterolemia    Orthostatic hypotension    Pneumonia as child   Pneumothorax on left 05/20/2009   Prediabetes    none now   Pulmonary nodules    Sleep apnea    cpap set on 13   Thrombocytopenia ([place])    pt denies   Thyromegaly    Varicose veins    Past Surgical History:  Procedure Laterality Date   BIOPSY  04/18/2018   Procedure: BIOPSY;  Surgeon: Ronnette Juniper, MD;  Location: [facility];  Service: Gastroenterology;;   ESOPHAGOGASTRODUODENOSCOPY N/A 04/18/2018   Procedure: ESOPHAGOGASTRODUODENOSCOPY (EGD);  Surgeon: Ronnette Juniper, MD;  Location: [facility];  Service: Gastroenterology;  Laterality: N/A;   ESOPHAGOGASTRODUODENOSCOPY (EGD) WITH PROPOFOL N/A 01/05/2015   Procedure:  ESOPHAGOGASTRODUODENOSCOPY (EGD) WITH PROPOFOL;  Surgeon: Garlan Fair, MD;  Location: [facility];  Service: Endoscopy;  Laterality: N/A;   EXTRACORPOREAL SHOCK WAVE LITHOTRIPSY Left 08/11/2021   Procedure: LEFT EXTRACORPOREAL SHOCK WAVE LITHOTRIPSY (ESWL);  Surgeon: Janith Lima, MD;  Location: [facility];  Service: Urology;  Laterality: Left;   GASTRIC ROUX-EN-Y     '08-[name] (weight stable around 302 #   KNEE ARTHROSCOPY     TONSILLECTOMY     TOTAL KNEE ARTHROPLASTY Left 06/03/2021   Procedure: LEFT TOTAL KNEE ARTHROPLASTY;  Surgeon: Newt Minion, MD;  Location: [facility];  Service: Orthopedics;  Laterality: Left;   ULNAR NERVE TRANSPOSITION Right 15 yrs ago   Patient Active Problem List   Diagnosis Date Noted   Total knee replacement status, 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   Unilateral primary osteoarthritis, left knee    Longstanding persistent atrial fibrillation ([place]) 04/26/2021   Preop cardiovascular exam 04/26/2021   Orthostatic hypotension 02/20/2019   Chronic anticoagulation 03/05/2015   Abnormal chest x-ray with multiple lung nodules 12/14/2014   AS (aortic stenosis) 12/14/2014   Atrial flutter by electrocardiogram ([place]) 12/14/2014   CAD (coronary artery disease) 12/14/2014   DJD (degenerative joint disease) 12/14/2014   History of GI bleed 12/14/2014   Nephrolithiasis  12/14/2014   Other activity(E029.9) 12/14/2014   Prediabetes 12/14/2014   Thrombocytopenia ([place]) 12/14/2014   Arterial blood pressure decreased 10/28/2014   Atrial fibrillation ([place]) 09/02/2014   Morbid obesity ([place]) 09/02/2014   AF (paroxysmal atrial fibrillation) ([place]) 08/03/2014   Gastroduodenal ulcer 08/03/2014   Apnea, sleep 08/03/2014   Glaucoma suspect 02/21/2013   Glaucoma suspect of both eyes 02/21/2013   Herpes 04/11/2012   Routine general medical examination at a health care facility 04/11/2012   DYSLIPIDEMIA 05/31/2009   DEPRESSION/ANXIETY 05/31/2009   ATTENTION DEFICIT DISORDER  05/31/2009   Obstructive sleep apnea 05/31/2009   GERD 05/31/2009   GOUT, HX OF 05/31/2009    REFERRING DIAG: Unspecified fall, Other abnormalities of gait and mobility  THERAPY DIAG:  Other abnormalities of gait and mobility  Muscle weakness (generalized)  History of falling  Rationale for Evaluation and Treatment Rehabilitation  PERTINENT HISTORY: Left TKA 06/03/2021, ADD, OCD, anxiety, depression  PRECAUTIONS: Fall   SUBJECTIVE: Patient reports he is doing well, his balance exercise is going slowly.   PAIN:  Are you having pain? No  PATIENT GOALS: Improve balance and falls   OBJECTIVE: (objective measures completed at initial evaluation unless otherwise dated) PATIENT SURVEYS:  FOTO 49% functional status   SENSATION: Patient reports numbness due to neuropathy   POSTURE:            Rounded shoulder posture   LOWER EXTREMITY MMT:   MMT Right eval Left eval Rt / Lt 03/09/2022  Hip flexion 4 4   Hip extension 4- 4-   Hip abduction 4- 4- 4- / 4-  Knee flexion 5 5   Knee extension 5 5   Ankle dorsiflexion 5 5   Ankle plantarflexion 4 4     FUNCTIONAL TESTS:  5 times sit to stand: 14 seconds 6 minute walk test: 1235 ft Berg Balance Scale: 52/56   GAIT: Distance walked: 1235 Assistive device utilized: None Level of assistance: Complete Independence Comments: patient with left trunk lean, trendelenburg on leg, slightly antalgic on right, patient with occasional unsteady gait with one instance of poor foot clearance on left with stumble     TODAY'S TREATMENT: [facility] Adult PT Treatment:                                                DATE: 03/09/2022 Therapeutic Exercise: Sidelying hip abduction 2 x 15 each Goblet squat to elevated table holding 25# 3 x 10 Resisted side and retro stepping with FM 10# x 5 each Rockerboard forward/backward taps x 2 min Romberg on Airex with head turns and nods x 20 each Row with blue 2 x 20   [facility] Adult PT Treatment:                                                 DATE: 02/22/2022 Therapeutic Exercise: Tandem stance 3 x 30 sec each   PATIENT EDUCATION:  Education details: HEP update Person educated: Patient Education method: Explanation, Demonstration, Tactile cues, Verbal cues, and Handouts Education comprehension: verbalized understanding, returned demonstration, verbal cues required, tactile cues required, and needs further education   HOME EXERCISE PROGRAM: Access Code: [email or web address]      ASSESSMENT: CLINICAL IMPRESSION: Patient tolerated therapy  well with no adverse effects. Therapy focused on progression of strengthening and standing stability. He did have a couple occasions of stumbling with resisted walking more on the walk back indicating impaired control, but did perform well with rockerboard exercise. Incorporate some postural strengthening this visit and updated HEP to progress strengthening and postural training at home.  Patient would benefit from continued skilled PT to progress his strength and balance in order to improve walking and reduce fall risk.     OBJECTIVE IMPAIRMENTS Abnormal gait, decreased activity tolerance, decreased balance, decreased strength, impaired sensation, and pain.    ACTIVITY LIMITATIONS stairs and locomotion level   PARTICIPATION LIMITATIONS: shopping and community activity   PERSONAL FACTORS Behavior pattern, Past/current experiences, Time since onset of injury/illness/exacerbation, and 3+ comorbidities: see above  are also affecting patient's functional outcome.      GOALS: Goals reviewed with patient? Yes   SHORT TERM GOALS: Target date: 03/22/2022    Patient will be I with initial HEP in order to progress with therapy. Baseline: HEP provided at eval Goal status: INITIAL   2.  PT will review FOTO with patient by 3rd visit in order to understand expected progress and outcome with therapy. Baseline: FOTO assessed at eval Goal status: INITIAL   3.  Patient will  demonstrate 5xSTS </= 12 sec in order to indicate improved LE strength and reduced fall risk Baseline: 14 sec Goal status: INITIAL   LONG TERM GOALS: Target date: 04/19/2022    Patient will be I with final HEP to maintain progress from PT. Baseline: HEP provided at eval Goal status: INITIAL   2.  Patient will report >/= 63% status on FOTO to indicate improved functional ability. Baseline: 49% functional status Goal status: INITIAL   3.  Patient will perform 6MWT >/= 1635 ft in order to improve community access Baseline: 1235 ft Goal status: INITIAL   4.  Patient will demonstrate BERG >/= 55/56 in order to indicate improved balance and reduced fall risk Baseline: 52/56 Goal status: INITIAL     PLAN: PT FREQUENCY: 1x/week   PT DURATION: 8 weeks   PLANNED INTERVENTIONS: Therapeutic exercises, Therapeutic activity, Neuromuscular re-education, Balance training, Gait training, Patient/Family education, Self Care, Joint mobilization, Stair training, Aquatic Therapy, Dry Needling, Cryotherapy, Moist heat, Manual therapy, and Re-evaluation   PLAN FOR NEXT SESSION: Review HEP and progress PRN, initiate hip and LE strengthening progressing to closed chair exercises such as step-ups, balance training progressing to dynamic    Hilda Blades, PT, DPT, LAT, ATC 03/09/22  3:19 PM Phone: [phone] Fax: [phone]

## 2022-03-09 ENCOUNTER — Other Ambulatory Visit: Payer: Self-pay

## 2022-03-09 ENCOUNTER — Ambulatory Visit: Payer: Medicare Other | Admitting: Physical Therapy

## 2022-03-09 ENCOUNTER — Encounter: Payer: Self-pay | Admitting: Physical Therapy

## 2022-03-09 DIAGNOSIS — R2689 Other abnormalities of gait and mobility: Secondary | ICD-10-CM | POA: Diagnosis not present

## 2022-03-09 DIAGNOSIS — Z9181 History of falling: Secondary | ICD-10-CM | POA: Diagnosis not present

## 2022-03-09 DIAGNOSIS — M6281 Muscle weakness (generalized): Secondary | ICD-10-CM | POA: Diagnosis not present

## 2022-03-09 NOTE — Patient Instructions (Signed)
Access Code: [email or web address] URL: [email or web address] Date: 03/09/2022 Prepared by: Hilda Blades  Exercises - Supine Quadriceps Stretch with Strap on Table  - 2-3 x daily - 7 x weekly - 1 sets - 2-3 reps - 30 seconds hold - Standing Gastroc Stretch on Step with Counter Support  - 2-3 x daily - 7 x weekly - 1 sets - 2-3 reps - 30 seconds hold - Seated Hamstring Stretch with Strap  - 2-3 x daily - 7 x weekly - 1 sets - 2-3 reps - 30 seconds hold - Mini Squat with Counter Support  - 1 x daily - 7 x weekly - 3 sets - 10 reps - Half Deadlift with Kettlebell  - 1 x daily - 7 x weekly - 3 sets - 10 reps - Full Leg Press  - 1 x daily - 7 x weekly - 3 sets - 10 reps - Single Leg Knee Extension with Weight Machine  - 1 x daily - 7 x weekly - 3 sets - 10 reps - Single Leg Hamstring Curl with Weight Machine  - 1 x daily - 7 x weekly - 3 sets - 10 reps - Standing Tandem Balance with Counter Support  - 3 x daily - 3 reps - 30 seconds hold - Sidelying Hip Abduction  - 1 x daily - 2 sets - 15 reps - Standing Row with Anchored Resistance  - 1 x daily - 3 sets - 20 reps

## 2022-03-14 ENCOUNTER — Telehealth: Payer: Self-pay | Admitting: Orthopedic Surgery

## 2022-03-14 NOTE — Telephone Encounter (Signed)
Francisco Padilla is asking for a note stating that "he no longer requires antibiotics for dental procedures".  He would like this note faxed to his dentist office attn: Dr. Ricard Dillon.  He did not have the fax #, I called their office number to try and get it for you, but it went to voicemail.  Dental office # is [phone].  Francisco Padilla can be reached at [phone].

## 2022-03-15 ENCOUNTER — Encounter: Payer: Self-pay | Admitting: Physical Therapy

## 2022-03-15 ENCOUNTER — Telehealth: Payer: Self-pay | Admitting: Orthopedic Surgery

## 2022-03-15 ENCOUNTER — Other Ambulatory Visit: Payer: Self-pay

## 2022-03-15 ENCOUNTER — Ambulatory Visit: Payer: Medicare Other | Admitting: Physical Therapy

## 2022-03-15 DIAGNOSIS — M6281 Muscle weakness (generalized): Secondary | ICD-10-CM

## 2022-03-15 DIAGNOSIS — Z9181 History of falling: Secondary | ICD-10-CM | POA: Diagnosis not present

## 2022-03-15 DIAGNOSIS — R2689 Other abnormalities of gait and mobility: Secondary | ICD-10-CM | POA: Diagnosis not present

## 2022-03-15 NOTE — Telephone Encounter (Signed)
Pt informed that letter has been faxed to Dr. Ricard Dillon office.

## 2022-03-15 NOTE — Therapy (Signed)
OUTPATIENT PHYSICAL THERAPY TREATMENT NOTE   Patient Name: Francisco Padilla MRN: [id number] DOB:Nov 27, 1950, 71 y.o., male Today's Date: 03/15/2022  PCP: Lajean Manes, MD   REFERRING PROVIDER: Kathalene Frames, MD   END OF SESSION:   PT End of Session - 03/15/22 1442     Visit Number 3    Number of Visits 9    Date for PT Re-Evaluation 04/19/22    Authorization Type MCR    Authorization Time Period FOTO by 6th, KX by 15th    Progress Note Due on Visit 10    PT Start Time 1445    PT Stop Time 1530    PT Time Calculation (min) 45 min    Activity Tolerance Patient tolerated treatment well    Behavior During Therapy [facility] for tasks assessed/performed              Past Medical History:  Diagnosis Date   ADD (attention deficit disorder)    Anxiety 2007   Asthma as child   Asymptomatic gallstones    Atrial fibrillation (HCC)    Atrial flutter (HCC)    Depression    DJD (degenerative joint disease) of knee    Dysrhythmia    Chronic Atrial Fib- seeing Dr. Lenna Sciara [facility]   H/O peptic ulcer    past history with GI bleed- cauterization done throught scope   History of kidney stones    pt claims urology discovered a kidney stone last week 05/25/21   Hypercholesterolemia    Orthostatic hypotension    Pneumonia as child   Pneumothorax on left 05/20/2009   Prediabetes    none now   Pulmonary nodules    Sleep apnea    cpap set on 13   Thrombocytopenia ([place])    pt denies   Thyromegaly    Varicose veins    Past Surgical History:  Procedure Laterality Date   BIOPSY  04/18/2018   Procedure: BIOPSY;  Surgeon: Ronnette Juniper, MD;  [facility];  Service: Gastroenterology;;   ESOPHAGOGASTRODUODENOSCOPY N/A 04/18/2018   Procedure: ESOPHAGOGASTRODUODENOSCOPY (EGD);  Surgeon: Ronnette Juniper, MD;  Location: [facility];  Service: Gastroenterology;  Laterality: N/A;   ESOPHAGOGASTRODUODENOSCOPY (EGD) WITH PROPOFOL N/A 01/05/2015   Procedure:  ESOPHAGOGASTRODUODENOSCOPY (EGD) WITH PROPOFOL;  Surgeon: Garlan Fair, MD;  [facility]: [facility];  Service: Endoscopy;  Laterality: N/A;   EXTRACORPOREAL SHOCK WAVE LITHOTRIPSY Left 08/11/2021   Procedure: LEFT EXTRACORPOREAL SHOCK WAVE LITHOTRIPSY (ESWL);  Surgeon: Janith Lima, MD;  Location: [facility];  Service: Urology;  Laterality: Left;   GASTRIC ROUX-EN-Y     '08-[name] (weight stable around 302 #   KNEE ARTHROSCOPY     TONSILLECTOMY     TOTAL KNEE ARTHROPLASTY Left 06/03/2021   Procedure: LEFT TOTAL KNEE ARTHROPLASTY;  Surgeon: Newt Minion, MD;  Location: [place];  Service: Orthopedics;  Laterality: Left;   ULNAR NERVE TRANSPOSITION Right 15 yrs ago   Patient Active Problem List   Diagnosis Date Noted   Total knee replacement status, 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   Unilateral primary osteoarthritis, left knee    Longstanding persistent atrial fibrillation ([place]) 04/26/2021   Preop cardiovascular exam 04/26/2021   Orthostatic hypotension 02/20/2019   Chronic anticoagulation 03/05/2015   Abnormal chest x-ray with multiple lung nodules 12/14/2014   AS (aortic stenosis) 12/14/2014   Atrial flutter by electrocardiogram ([place]) 12/14/2014   CAD (coronary artery disease) 12/14/2014   DJD (degenerative joint disease) 12/14/2014   History of GI bleed 12/14/2014  Nephrolithiasis 12/14/2014   Other activity(E029.9) 12/14/2014   Prediabetes 12/14/2014   Thrombocytopenia ([place]) 12/14/2014   Arterial blood pressure decreased 10/28/2014   Atrial fibrillation ([place]) 09/02/2014   Morbid obesity ([place]) 09/02/2014   AF (paroxysmal atrial fibrillation) ([place]) 08/03/2014   Gastroduodenal ulcer 08/03/2014   Apnea, sleep 08/03/2014   Glaucoma suspect 02/21/2013   Glaucoma suspect of both eyes 02/21/2013   Herpes 04/11/2012   Routine general medical examination at a health care facility 04/11/2012   DYSLIPIDEMIA 05/31/2009   DEPRESSION/ANXIETY 05/31/2009   ATTENTION DEFICIT DISORDER  05/31/2009   Obstructive sleep apnea 05/31/2009   GERD 05/31/2009   GOUT, HX OF 05/31/2009    REFERRING DIAG: Unspecified fall, Other abnormalities of gait and mobility  THERAPY DIAG:  Other abnormalities of gait and mobility  Muscle weakness (generalized)  History of falling  Rationale for Evaluation and Treatment Rehabilitation  PERTINENT HISTORY: Left TKA 06/03/2021, ADD, OCD, anxiety, depression  PRECAUTIONS: Fall   SUBJECTIVE: Patient reports he feels his neuropathy is progressing further. He also notes some confusion that he is seeing his PCP for to have some testing.  PAIN:  Are you having pain? No  PATIENT GOALS: Improve balance and falls   OBJECTIVE: (objective measures completed at initial evaluation unless otherwise dated) PATIENT SURVEYS:  FOTO 49% functional status   SENSATION: Patient reports numbness due to neuropathy   POSTURE:            Rounded shoulder posture   LOWER EXTREMITY MMT:   MMT Right eval Left eval Rt / Lt 03/09/2022  Hip flexion 4 4   Hip extension 4- 4-   Hip abduction 4- 4- 4- / 4-  Knee flexion 5 5   Knee extension 5 5   Ankle dorsiflexion 5 5   Ankle plantarflexion 4 4     FUNCTIONAL TESTS:  5 times sit to stand: 14 seconds 6 minute walk test: 1235 ft Berg Balance Scale: 52/56   GAIT: Distance walked: 1235 Assistive device utilized: None Level of assistance: Complete Independence Comments: patient with left trunk lean, trendelenburg on leg, slightly antalgic on right, patient with occasional unsteady gait with one instance of poor foot clearance on left with stumble     TODAY'S TREATMENT: [facility] Adult PT Treatment:                                                DATE: 03/15/2022 Therapeutic Exercise: NuStep L6 x 5 min with LE/UE  while taking subjective Bridge 2 x 10 SLR 2 x 10 each Sidelying hip abduction 2 x 15 each Goblet squat to elevated table holding 25# 3 x 10 Sit to stand without UE 2 x 10   [facility] Adult  PT Treatment:                                                DATE: 03/09/2022 Therapeutic Exercise: Sidelying hip abduction 2 x 15 each Goblet squat to elevated table holding 25# 3 x 10 Resisted side and retro stepping with FM 10# x 5 each Rockerboard forward/backward taps x 2 min Romberg on Airex with head turns and nods x 20 each Row with blue 2 x 20  [facility] Adult PT Treatment:  DATE: 02/22/2022 Therapeutic Exercise: Tandem stance 3 x 30 sec each   PATIENT EDUCATION:  Education details: HEP update Person educated: Patient Education method: Explanation, Demonstration, Tactile cues, Verbal cues, and Handouts Education comprehension: verbalized understanding, returned demonstration, verbal cues required, tactile cues required, and needs further education   HOME EXERCISE PROGRAM: Access Code: [email or web address]      ASSESSMENT: CLINICAL IMPRESSION: Patient tolerated therapy well with no adverse effects. Therapy focused primarily on progression of strengthening this visit with good tolerance. Updated his HEP to progress his leg strengthening at home and he was instructed on variety of gym exercise her could perform for total body strengthening, and progressing from light weight to moderate weight. Patient would benefit from continued skilled PT to progress his strength and balance in order to improve walking and reduce fall risk.     OBJECTIVE IMPAIRMENTS Abnormal gait, decreased activity tolerance, decreased balance, decreased strength, impaired sensation, and pain.    ACTIVITY LIMITATIONS stairs and locomotion level   PARTICIPATION LIMITATIONS: shopping and community activity   PERSONAL FACTORS Behavior pattern, Past/current experiences, Time since onset of injury/illness/exacerbation, and 3+ comorbidities: see above  are also affecting patient's functional outcome.      GOALS: Goals reviewed with patient? Yes   SHORT TERM GOALS: Target date:  03/22/2022    Patient will be I with initial HEP in order to progress with therapy. Baseline: HEP provided at eval Goal status: INITIAL   2.  PT will review FOTO with patient by 3rd visit in order to understand expected progress and outcome with therapy. Baseline: FOTO assessed at eval Goal status: INITIAL   3.  Patient will demonstrate 5xSTS </= 12 sec in order to indicate improved LE strength and reduced fall risk Baseline: 14 sec Goal status: INITIAL   LONG TERM GOALS: Target date: 04/19/2022    Patient will be I with final HEP to maintain progress from PT. Baseline: HEP provided at eval Goal status: INITIAL   2.  Patient will report >/= 63% status on FOTO to indicate improved functional ability. Baseline: 49% functional status Goal status: INITIAL   3.  Patient will perform 6MWT >/= 1635 ft in order to improve community access Baseline: 1235 ft Goal status: INITIAL   4.  Patient will demonstrate BERG >/= 55/56 in order to indicate improved balance and reduced fall risk Baseline: 52/56 Goal status: INITIAL     PLAN: PT FREQUENCY: 1x/week   PT DURATION: 8 weeks   PLANNED INTERVENTIONS: Therapeutic exercises, Therapeutic activity, Neuromuscular re-education, Balance training, Gait training, Patient/Family education, Self Care, Joint mobilization, Stair training, Aquatic Therapy, Dry Needling, Cryotherapy, Moist heat, Manual therapy, and Re-evaluation   PLAN FOR NEXT SESSION: Review HEP and progress PRN, initiate hip and LE strengthening progressing to closed chair exercises such as step-ups, balance training progressing to dynamic    Hilda Blades, PT, DPT, LAT, ATC 03/15/22  4:22 PM Phone: [phone] Fax: [phone]

## 2022-03-15 NOTE — Telephone Encounter (Signed)
See previous telephone message. Letter written and faxed to dental office.

## 2022-03-15 NOTE — Patient Instructions (Signed)
Access Code: [email or web address] URL: [email or web address] Date: 03/15/2022 Prepared by: Hilda Blades  Exercises - Standing Tandem Balance with Counter Support  - 3 x daily - 3 reps - 30 seconds hold - Sidelying Hip Abduction  - 1 x daily - 2 sets - 15 reps - Standing Row with Anchored Resistance  - 1 x daily - 3 sets - 20 reps - Sit to Stand Without Arm Support  - 1 x daily - 3 sets - 10 reps

## 2022-03-22 ENCOUNTER — Ambulatory Visit: Payer: Medicare Other | Admitting: Physical Therapy

## 2022-03-22 ENCOUNTER — Encounter: Payer: Self-pay | Admitting: Physical Therapy

## 2022-03-22 ENCOUNTER — Other Ambulatory Visit: Payer: Self-pay

## 2022-03-22 DIAGNOSIS — R2689 Other abnormalities of gait and mobility: Secondary | ICD-10-CM | POA: Diagnosis not present

## 2022-03-22 DIAGNOSIS — M6281 Muscle weakness (generalized): Secondary | ICD-10-CM

## 2022-03-22 DIAGNOSIS — Z9181 History of falling: Secondary | ICD-10-CM | POA: Diagnosis not present

## 2022-03-22 NOTE — Therapy (Signed)
OUTPATIENT PHYSICAL THERAPY TREATMENT NOTE   Patient Name: Francisco Padilla MRN: [id number] DOB:1951/04/04, 71 y.o., male Today's Date: 03/22/2022  PCP: Lajean Manes, MD   REFERRING PROVIDER: Kathalene Frames, MD   END OF SESSION:   PT End of Session - 03/22/22 1536     Visit Number 4    Number of Visits 9    Date for PT Re-Evaluation 04/19/22    Authorization Type MCR    Authorization Time Period FOTO by 6th, KX by 15th    Progress Note Due on Visit 10    PT Start Time 1530    PT Stop Time 1615    PT Time Calculation (min) 45 min    Activity Tolerance Patient tolerated treatment well    Behavior During Therapy [facility] for tasks assessed/performed               Past Medical History:  Diagnosis Date   ADD (attention deficit disorder)    Anxiety 2007   Asthma as child   Asymptomatic gallstones    Atrial fibrillation (HCC)    Atrial flutter ([place])    Depression    DJD (degenerative joint disease) of knee    Dysrhythmia    Chronic Atrial Fib- seeing Dr. Lenna Sci[facility]   H/O peptic ulcer    past history with GI bleed- cauterization done throught scope   History of kidney stones    pt claims urology discovered a kidney stone last week 05/25/21   Hypercholesterolemia    Orthostatic hypotension    Pneumonia as child   Pneumothorax on left 05/20/2009   Prediabetes    none now   Pulmonary nodules    Sleep apnea    cpap set on 13   Thrombocytopenia ([place])    pt denies   Thyromegaly    Varicose veins    Past Surgical History:  Procedure Laterality Date   BIOPSY  04/18/2018   Procedure: BIOPSY;  Surgeon: Ronnette Juniper, MD;  Location: [facility];  Service: Gastroenterology;;   ESOPHAGOGASTRODUODENOSCOPY N/A 04/18/2018   Procedure: ESOPHAGOGASTRODUODENOSCOPY (EGD);  Surgeon: Ronnette Juniper, MD;  Location: [facility];  Service: Gastroenterology;  Laterality: N/A;   ESOPHAGOGASTRODUODENOSCOPY (EGD) WITH PROPOFOL N/A 01/05/2015   Procedure:  ESOPHAGOGASTRODUODENOSCOPY (EGD) WITH PROPOFOL;  Surgeon: Garlan Fair, MD;  Location: [facility];  Service: Endoscopy;  Laterality: N/A;   EXTRACORPOREAL SHOCK WAVE LITHOTRIPSY Left 08/11/2021   Procedure: LEFT EXTRACORPOREAL SHOCK WAVE LITHOTRIPSY (ESWL);  Surgeon: Janith Lima, MD;  Location: [facility];  Service: Urology;  Laterality: Left;   GASTRIC ROUX-EN-Y     '08-[name] (weight stable around 302 #   KNEE ARTHROSCOPY     TONSILLECTOMY     TOTAL KNEE ARTHROPLASTY Left 06/03/2021   Procedure: LEFT TOTAL KNEE ARTHROPLASTY;  Surgeon: Newt Minion, MD;  Location: [place];  Service: Orthopedics;  Laterality: Left;   ULNAR NERVE TRANSPOSITION Right 15 yrs ago   Patient Active Problem List   Diagnosis Date Noted   Total knee replacement status, 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   Unilateral primary osteoarthritis, left knee    Longstanding persistent atrial fibrillation ([place]) 04/26/2021   Preop cardiovascular exam 04/26/2021   Orthostatic hypotension 02/20/2019   Chronic anticoagulation 03/05/2015   Abnormal chest x-ray with multiple lung nodules 12/14/2014   AS (aortic stenosis) 12/14/2014   Atrial flutter by electrocardiogram ([place]) 12/14/2014   CAD (coronary artery disease) 12/14/2014   DJD (degenerative joint disease) 12/14/2014   History of GI bleed 12/14/2014  Nephrolithiasis 12/14/2014   Other activity(E029.9) 12/14/2014   Prediabetes 12/14/2014   Thrombocytopenia ([place]) 12/14/2014   Arterial blood pressure decreased 10/28/2014   Atrial fibrillation ([place]) 09/02/2014   Morbid obesity ([place]) 09/02/2014   AF (paroxysmal atrial fibrillation) ([place]) 08/03/2014   Gastroduodenal ulcer 08/03/2014   Apnea, sleep 08/03/2014   Glaucoma suspect 02/21/2013   Glaucoma suspect of both eyes 02/21/2013   Herpes 04/11/2012   Routine general medical examination at a health care facility 04/11/2012   DYSLIPIDEMIA 05/31/2009   DEPRESSION/ANXIETY 05/31/2009   ATTENTION DEFICIT DISORDER  05/31/2009   Obstructive sleep apnea 05/31/2009   GERD 05/31/2009   GOUT, HX OF 05/31/2009    REFERRING DIAG: Unspecified fall, Other abnormalities of gait and mobility  THERAPY DIAG:  Other abnormalities of gait and mobility  Muscle weakness (generalized)  History of falling  Rationale for Evaluation and Treatment Rehabilitation  PERTINENT HISTORY: Left TKA 06/03/2021, ADD, OCD, anxiety, depression  PRECAUTIONS: Fall   SUBJECTIVE: Patient reports his neuropathy in his feet was painful last night.   PAIN:  Are you having pain? No  PATIENT GOALS: Improve balance and falls   OBJECTIVE: (objective measures completed at initial evaluation unless otherwise dated) PATIENT SURVEYS:  FOTO 49% functional status   SENSATION: Patient reports numbness due to neuropathy   POSTURE:            Rounded shoulder posture   LOWER EXTREMITY MMT:   MMT Right eval Left eval Rt / Lt 03/09/2022  Hip flexion 4 4   Hip extension 4- 4-   Hip abduction 4- 4- 4- / 4-  Knee flexion 5 5   Knee extension 5 5   Ankle dorsiflexion 5 5   Ankle plantarflexion 4 4     FUNCTIONAL TESTS:  5 times sit to stand: 14 seconds  03/22/2022: 12 seconds 6 minute walk test: 1235 ft Berg Balance Scale: 52/56   GAIT: Distance walked: 1235 Assistive device utilized: None Level of assistance: Complete Independence Comments: patient with left trunk lean, trendelenburg on leg, slightly antalgic on right, patient with occasional unsteady gait with one instance of poor foot clearance on left with stumble     TODAY'S TREATMENT: [facility] Adult PT Treatment:                                                DATE: 03/22/2022 Therapeutic Exercise: NuStep L6 x 5 min with LE/UE  while taking subjective Sidelying hip abduction 2 x 15 each Goblet squat to elevated table holding 25# 3 x 10 Sit to stand without UE 2 x 10 Forward 8" step-up 2 x 10 each without UE support Standing heel raises 2 x 20 Tandem stance 3 x 30  sec   [facility] Adult PT Treatment:                                                DATE: 03/15/2022 Therapeutic Exercise: NuStep L6 x 5 min with LE/UE  while taking subjective Bridge 2 x 10 SLR 2 x 10 each Sidelying hip abduction 2 x 15 each Goblet squat to elevated table holding 25# 3 x 10 Sit to stand without UE 2 x 10  [facility] Adult PT Treatment:  DATE: 03/09/2022 Therapeutic Exercise: Sidelying hip abduction 2 x 15 each Goblet squat to elevated table holding 25# 3 x 10 Resisted side and retro stepping with FM 10# x 5 each Rockerboard forward/backward taps x 2 min Romberg on Airex with head turns and nods x 20 each Row with blue 2 x 20   PATIENT EDUCATION:  Education details: HEP Person educated: Patient Education method: Consulting civil engineer, Demonstration, Corporate treasurer cues, Verbal cues Education comprehension: verbalized understanding, returned demonstration, verbal cues required, tactile cues required, and needs further education   HOME EXERCISE PROGRAM: Access Code: [email or web address]      ASSESSMENT: CLINICAL IMPRESSION: Patient tolerated therapy well with no adverse effects. Therapy focused on continued strengthening and standing stability. He continues to require consistent cueing for exercise technique and proper posture to maintain a good balanced position. He did have greater difficulty with step-ups on the right side secondary to knee pain. He did not require any UE support with step ups or tandem stance this visit. No changes to HEP this visit. Patient would benefit from continued skilled PT to progress his strength and balance in order to improve walking and reduce fall risk.     OBJECTIVE IMPAIRMENTS Abnormal gait, decreased activity tolerance, decreased balance, decreased strength, impaired sensation, and pain.    ACTIVITY LIMITATIONS stairs and locomotion level   PARTICIPATION LIMITATIONS: shopping and community activity   PERSONAL FACTORS  Behavior pattern, Past/current experiences, Time since onset of injury/illness/exacerbation, and 3+ comorbidities: see above  are also affecting patient's functional outcome.      GOALS: Goals reviewed with patient? Yes   SHORT TERM GOALS: Target date: 03/22/2022    Patient will be I with initial HEP in order to progress with therapy. Baseline: HEP provided at eval 03/22/2022: independent Goal status: MET   2.  PT will review FOTO with patient by 3rd visit in order to understand expected progress and outcome with therapy. Baseline: FOTO assessed at eval 03/22/2022: reviewed Goal status: MET   3.  Patient will demonstrate 5xSTS </= 12 sec in order to indicate improved LE strength and reduced fall risk Baseline: 14 sec 03/22/2022: 12 sec Goal status: MET   LONG TERM GOALS: Target date: 04/19/2022    Patient will be I with final HEP to maintain progress from PT. Baseline: HEP provided at eval Goal status: INITIAL   2.  Patient will report >/= 63% status on FOTO to indicate improved functional ability. Baseline: 49% functional status Goal status: INITIAL   3.  Patient will perform 6MWT >/= 1635 ft in order to improve community access Baseline: 1235 ft Goal status: INITIAL   4.  Patient will demonstrate BERG >/= 55/56 in order to indicate improved balance and reduced fall risk Baseline: 52/56 Goal status: INITIAL     PLAN: PT FREQUENCY: 1x/week   PT DURATION: 8 weeks   PLANNED INTERVENTIONS: Therapeutic exercises, Therapeutic activity, Neuromuscular re-education, Balance training, Gait training, Patient/Family education, Self Care, Joint mobilization, Stair training, Aquatic Therapy, Dry Needling, Cryotherapy, Moist heat, Manual therapy, and Re-evaluation   PLAN FOR NEXT SESSION: Review HEP and progress PRN, initiate hip and LE strengthening progressing to closed chair exercises such as step-ups, balance training progressing to dynamic    Hilda Blades, PT, DPT, LAT,  ATC 03/22/22  4:19 PM Phone: [phone] Fax: [phone]

## 2022-03-23 DIAGNOSIS — M1711 Unilateral primary osteoarthritis, right knee: Secondary | ICD-10-CM | POA: Diagnosis not present

## 2022-03-23 DIAGNOSIS — M25561 Pain in right knee: Secondary | ICD-10-CM | POA: Diagnosis not present

## 2022-03-24 DIAGNOSIS — F411 Generalized anxiety disorder: Secondary | ICD-10-CM | POA: Diagnosis not present

## 2022-03-29 ENCOUNTER — Encounter: Payer: Medicare Other | Admitting: Physical Therapy

## 2022-03-29 DIAGNOSIS — F900 Attention-deficit hyperactivity disorder, predominantly inattentive type: Secondary | ICD-10-CM | POA: Diagnosis not present

## 2022-03-29 DIAGNOSIS — F411 Generalized anxiety disorder: Secondary | ICD-10-CM | POA: Diagnosis not present

## 2022-03-29 DIAGNOSIS — I482 Chronic atrial fibrillation, unspecified: Secondary | ICD-10-CM | POA: Diagnosis not present

## 2022-03-29 DIAGNOSIS — I700 Atherosclerosis of aorta: Secondary | ICD-10-CM | POA: Diagnosis not present

## 2022-03-29 DIAGNOSIS — I10 Essential (primary) hypertension: Secondary | ICD-10-CM | POA: Diagnosis not present

## 2022-03-29 DIAGNOSIS — L570 Actinic keratosis: Secondary | ICD-10-CM | POA: Diagnosis not present

## 2022-03-29 DIAGNOSIS — Z0000 Encounter for general adult medical examination without abnormal findings: Secondary | ICD-10-CM | POA: Diagnosis not present

## 2022-03-29 DIAGNOSIS — K2270 Barrett's esophagus without dysplasia: Secondary | ICD-10-CM | POA: Diagnosis not present

## 2022-03-29 DIAGNOSIS — G609 Hereditary and idiopathic neuropathy, unspecified: Secondary | ICD-10-CM | POA: Diagnosis not present

## 2022-03-29 DIAGNOSIS — Z9884 Bariatric surgery status: Secondary | ICD-10-CM | POA: Diagnosis not present

## 2022-03-29 DIAGNOSIS — I350 Nonrheumatic aortic (valve) stenosis: Secondary | ICD-10-CM | POA: Diagnosis not present

## 2022-03-29 DIAGNOSIS — E7800 Pure hypercholesterolemia, unspecified: Secondary | ICD-10-CM | POA: Diagnosis not present

## 2022-03-29 NOTE — Therapy (Signed)
OUTPATIENT PHYSICAL THERAPY TREATMENT NOTE   Patient Name: Francisco Padilla MRN: [id number] DOB:12-14-1950, 71 y.o., male Today's Date: 03/30/2022  PCP: Lajean Manes, MD   REFERRING PROVIDER: Kathalene Frames, MD   END OF SESSION:   PT End of Session - 03/30/22 1358     Visit Number 5    Number of Visits 9    Date for PT Re-Evaluation 04/19/22    Authorization Type MCR    Authorization Time Period FOTO by 6th, KX by 15th    Progress Note Due on Visit 10    PT Start Time 1400    PT Stop Time 7048    PT Time Calculation (min) 45 min    Activity Tolerance Patient tolerated treatment well    Behavior During Therapy [facility] for tasks assessed/performed                Past Medical History:  Diagnosis Date   ADD (attention deficit disorder)    Anxiety 2007   Asthma as child   Asymptomatic gallstones    Atrial fibrillation (HCC)    Atrial flutter (HCC)    Depression    DJD (degenerative joint disease) of knee    Dysrhythmia    Chronic Atrial Fib- seeing Dr. Lenna Sci[facility]   H/O peptic ulcer    past history with GI bleed- cauterization done throught scope   History of kidney stones    pt claims urology discovered a kidney stone last week 05/25/21   Hypercholesterolemia    Orthostatic hypotension    Pneumonia as child   Pneumothorax on left 05/20/2009   Prediabetes    none now   Pulmonary nodules    Sleep apnea    cpap set on 13   Thrombocytopenia ([place])    pt denies   Thyromegaly    Varicose veins    Past Surgical History:  Procedure Laterality Date   BIOPSY  04/18/2018   Procedure: BIOPSY;  Surgeon: Ronnette Juniper, MD;  [facility];  Service: Gastroenterology;;   ESOPHAGOGASTRODUODENOSCOPY N/A 04/18/2018   Procedure: ESOPHAGOGASTRODUODENOSCOPY (EGD);  Surgeon: Ronnette Juniper, MD;  Location: [facility];  Service: Gastroenterology;  Laterality: N/A;   ESOPHAGOGASTRODUODENOSCOPY (EGD) WITH PROPOFOL N/A 01/05/2015   Procedure:  ESOPHAGOGASTRODUODENOSCOPY (EGD) WITH PROPOFOL;  Surgeon: Garlan Fair, MD;  [facility];  Service: Endoscopy;  Laterality: N/A;   EXTRACORPOREAL SHOCK WAVE LITHOTRIPSY Left 08/11/2021   Procedure: LEFT EXTRACORPOREAL SHOCK WAVE LITHOTRIPSY (ESWL);  Surgeon: Janith Lima, MD;  Location: [facility];  Service: Urology;  Laterality: Left;   GASTRIC ROUX-EN-Y     '08-[name] (weight stable around 302 #   KNEE ARTHROSCOPY     TONSILLECTOMY     TOTAL KNEE ARTHROPLASTY Left 06/03/2021   Procedure: LEFT TOTAL KNEE ARTHROPLASTY;  Surgeon: Newt Minion, MD;  Location: [place];  Service: Orthopedics;  Laterality: Left;   ULNAR NERVE TRANSPOSITION Right 15 yrs ago   Patient Active Problem List   Diagnosis Date Noted   Total knee replacement status, 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   Unilateral primary osteoarthritis, left knee    Longstanding persistent atrial fibrillation ([place]) 04/26/2021   Preop cardiovascular exam 04/26/2021   Orthostatic hypotension 02/20/2019   Chronic anticoagulation 03/05/2015   Abnormal chest x-ray with multiple lung nodules 12/14/2014   AS (aortic stenosis) 12/14/2014   Atrial flutter by electrocardiogram ([place]) 12/14/2014   CAD (coronary artery disease) 12/14/2014   DJD (degenerative joint disease) 12/14/2014   History of GI bleed 12/14/2014  Nephrolithiasis 12/14/2014   Other activity(E029.9) 12/14/2014   Prediabetes 12/14/2014   Thrombocytopenia ([place]) 12/14/2014   Arterial blood pressure decreased 10/28/2014   Atrial fibrillation ([place]) 09/02/2014   Morbid obesity ([place]) 09/02/2014   AF (paroxysmal atrial fibrillation) ([place]) 08/03/2014   Gastroduodenal ulcer 08/03/2014   Apnea, sleep 08/03/2014   Glaucoma suspect 02/21/2013   Glaucoma suspect of both eyes 02/21/2013   Herpes 04/11/2012   Routine general medical examination at a health care facility 04/11/2012   DYSLIPIDEMIA 05/31/2009   DEPRESSION/ANXIETY 05/31/2009   ATTENTION DEFICIT DISORDER  05/31/2009   Obstructive sleep apnea 05/31/2009   GERD 05/31/2009   GOUT, HX OF 05/31/2009    REFERRING DIAG: Unspecified fall, Other abnormalities of gait and mobility  THERAPY DIAG:  Other abnormalities of gait and mobility  Muscle weakness (generalized)  History of falling  Rationale for Evaluation and Treatment Rehabilitation  PERTINENT HISTORY: Left TKA 06/03/2021, ADD, OCD, anxiety, depression  PRECAUTIONS: Fall   SUBJECTIVE: Patient reports he is doing well. He is having some pain in the front of shins. He saw the orthopedic doctor who said he may not need surgery for the right knee if he is not having pain.   PAIN:  Are you having pain? No  PATIENT GOALS: Improve balance and falls   OBJECTIVE: (objective measures completed at initial evaluation unless otherwise dated) PATIENT SURVEYS:  FOTO 49% functional status   SENSATION: Patient reports numbness due to neuropathy   POSTURE:            Rounded shoulder posture   LOWER EXTREMITY MMT:   MMT Right eval Left eval Rt / Lt 03/09/2022  Hip flexion 4 4   Hip extension 4- 4-   Hip abduction 4- 4- 4- / 4-  Knee flexion 5 5   Knee extension 5 5   Ankle dorsiflexion 5 5   Ankle plantarflexion 4 4     FUNCTIONAL TESTS:  5 times sit to stand: 14 seconds  03/22/2022: 12 seconds 6 minute walk test: 1235 ft Berg Balance Scale: 52/56   GAIT: Distance walked: 1235 Assistive device utilized: None Level of assistance: Complete Independence Comments: patient with left trunk lean, trendelenburg on leg, slightly antalgic on right, patient with occasional unsteady gait with one instance of poor foot clearance on left with stumble     TODAY'S TREATMENT: [facility] Adult PT Treatment:                                                DATE: 03/30/2022 Therapeutic Exercise: NuStep L6 x 5 min with LE/UE  while taking subjective SLR 2 x 10 each Bridge 2 x 10 Sidelying hip abduction 2 x 15 each Sit to stand without UE 2 x 10 -  slow and controlled decent Forward 8" step-up 2 x 10 each without UE support Standing heel raises 2 x 20 Tandem stance 3 x 30 sec   [facility] Adult PT Treatment:                                                DATE: 03/22/2022 Therapeutic Exercise: NuStep L6 x 5 min with LE/UE  while taking subjective Sidelying hip abduction 2 x 15 each Goblet squat to elevated  table holding 25# 3 x 10 Sit to stand without UE 2 x 10 Forward 8" step-up 2 x 10 each without UE support Standing heel raises 2 x 20 Tandem stance 3 x 30 sec  [facility] Adult PT Treatment:                                                DATE: 03/15/2022 Therapeutic Exercise: NuStep L6 x 5 min with LE/UE  while taking subjective Bridge 2 x 10 SLR 2 x 10 each Sidelying hip abduction 2 x 15 each Goblet squat to elevated table holding 25# 3 x 10 Sit to stand without UE 2 x 10   PATIENT EDUCATION:  Education details: HEP Person educated: Patient Education method: Consulting civil engineer, Demonstration, Corporate treasurer cues, Verbal cues Education comprehension: verbalized understanding, returned demonstration, verbal cues required, tactile cues required, and needs further education   HOME EXERCISE PROGRAM: Access Code: [email or web address]      ASSESSMENT: CLINICAL IMPRESSION: Patient tolerated therapy well with no adverse effects. Therapy focused on strengthening and stability with good tolerance. He is tolerating progressions in his stability exercises without need of UE for support. He did exhibit continued greater difficulty with exercises on the right LE but reports feeling more confident with walking. Updated his HEP this visit to continue strength progression at home. Patient would benefit from continued skilled PT to progress his strength and balance in order to improve walking and reduce fall risk.     OBJECTIVE IMPAIRMENTS Abnormal gait, decreased activity tolerance, decreased balance, decreased strength, impaired sensation, and pain.    ACTIVITY LIMITATIONS  stairs and locomotion level   PARTICIPATION LIMITATIONS: shopping and community activity   PERSONAL FACTORS Behavior pattern, Past/current experiences, Time since onset of injury/illness/exacerbation, and 3+ comorbidities: see above  are also affecting patient's functional outcome.      GOALS: Goals reviewed with patient? Yes   SHORT TERM GOALS: Target date: 03/22/2022    Patient will be I with initial HEP in order to progress with therapy. Baseline: HEP provided at eval 03/22/2022: independent Goal status: MET   2.  PT will review FOTO with patient by 3rd visit in order to understand expected progress and outcome with therapy. Baseline: FOTO assessed at eval 03/22/2022: reviewed Goal status: MET   3.  Patient will demonstrate 5xSTS </= 12 sec in order to indicate improved LE strength and reduced fall risk Baseline: 14 sec 03/22/2022: 12 sec Goal status: MET   LONG TERM GOALS: Target date: 04/19/2022    Patient will be I with final HEP to maintain progress from PT. Baseline: HEP provided at eval Goal status: INITIAL   2.  Patient will report >/= 63% status on FOTO to indicate improved functional ability. Baseline: 49% functional status Goal status: INITIAL   3.  Patient will perform 6MWT >/= 1635 ft in order to improve community access Baseline: 1235 ft Goal status: INITIAL   4.  Patient will demonstrate BERG >/= 55/56 in order to indicate improved balance and reduced fall risk Baseline: 52/56 Goal status: INITIAL     PLAN: PT FREQUENCY: 1x/week   PT DURATION: 8 weeks   PLANNED INTERVENTIONS: Therapeutic exercises, Therapeutic activity, Neuromuscular re-education, Balance training, Gait training, Patient/Family education, Self Care, Joint mobilization, Stair training, Aquatic Therapy, Dry Needling, Cryotherapy, Moist heat, Manual therapy, and Re-evaluation   PLAN FOR NEXT SESSION: Review HEP  and progress PRN, initiate hip and LE strengthening progressing to closed  chair exercises such as step-ups, balance training progressing to dynamic    Hilda Blades, PT, DPT, LAT, ATC 03/30/22  2:54 PM Phone: [phone] Fax: [phone]

## 2022-03-30 ENCOUNTER — Other Ambulatory Visit: Payer: Self-pay

## 2022-03-30 ENCOUNTER — Ambulatory Visit: Payer: Medicare Other | Attending: Internal Medicine | Admitting: Physical Therapy

## 2022-03-30 ENCOUNTER — Encounter: Payer: Self-pay | Admitting: Physical Therapy

## 2022-03-30 DIAGNOSIS — M6281 Muscle weakness (generalized): Secondary | ICD-10-CM | POA: Diagnosis not present

## 2022-03-30 DIAGNOSIS — Z9181 History of falling: Secondary | ICD-10-CM | POA: Diagnosis not present

## 2022-03-30 DIAGNOSIS — R2689 Other abnormalities of gait and mobility: Secondary | ICD-10-CM | POA: Insufficient documentation

## 2022-03-30 NOTE — Patient Instructions (Signed)
Access Code: [email or web address] URL: [email or web address] Date: 03/30/2022 Prepared by: Hilda Blades  Exercises - Standing Tandem Balance with Counter Support  - 3 x daily - 3 reps - 30 seconds hold - Sidelying Hip Abduction  - 1 x daily - 2 sets - 15 reps - Standing Row with Anchored Resistance  - 1 x daily - 3 sets - 20 reps - Sit to Stand Without Arm Support  - 1 x daily - 3 sets - 10 reps - Heel Raises with Counter Support  - 1 x daily - 3 sets - 20 reps

## 2022-04-03 DIAGNOSIS — H43813 Vitreous degeneration, bilateral: Secondary | ICD-10-CM | POA: Diagnosis not present

## 2022-04-03 DIAGNOSIS — H353131 Nonexudative age-related macular degeneration, bilateral, early dry stage: Secondary | ICD-10-CM | POA: Diagnosis not present

## 2022-04-03 DIAGNOSIS — H31091 Other chorioretinal scars, right eye: Secondary | ICD-10-CM | POA: Diagnosis not present

## 2022-04-03 DIAGNOSIS — H35433 Paving stone degeneration of retina, bilateral: Secondary | ICD-10-CM | POA: Diagnosis not present

## 2022-04-05 ENCOUNTER — Ambulatory Visit: Payer: Medicare Other | Admitting: Physical Therapy

## 2022-04-07 DIAGNOSIS — H25813 Combined forms of age-related cataract, bilateral: Secondary | ICD-10-CM | POA: Diagnosis not present

## 2022-04-11 NOTE — Therapy (Signed)
OUTPATIENT PHYSICAL THERAPY TREATMENT NOTE   Patient Name: Francisco Padilla MRN: [id number] DOB:June 08, 1951, 71 y.o., male Today's Date: 04/12/2022  PCP: Lajean Manes, MD   REFERRING PROVIDER: Kathalene Frames, MD   END OF SESSION:   PT End of Session - 04/12/22 1534     Visit Number 6    Number of Visits 9    Date for PT Re-Evaluation 04/19/22    Authorization Type MCR    Authorization Time Period FOTO by 10th, KX by 15th    Progress Note Due on Visit 10    PT Start Time 1530    PT Stop Time 1615    PT Time Calculation (min) 45 min    Activity Tolerance Patient tolerated treatment well    Behavior During Therapy [facility] for tasks assessed/performed                 Past Medical History:  Diagnosis Date   ADD (attention deficit disorder)    Anxiety 2007   Asthma as child   Asymptomatic gallstones    Atrial fibrillation (HCC)    Atrial flutter (HCC)    Depression    DJD (degenerative joint disease) of knee    Dysrhythmia    Chronic Atrial Fib- seeing Dr. Lenna Sciara [facility]   H/O peptic ulcer    past history with GI bleed- cauterization done throught scope   History of kidney stones    pt claims urology discovered a kidney stone last week 05/25/21   Hypercholesterolemia    Orthostatic hypotension    Pneumonia as child   Pneumothorax on left 05/20/2009   Prediabetes    none now   Pulmonary nodules    Sleep apnea    cpap set on 13   Thrombocytopenia ([place])    pt denies   Thyromegaly    Varicose veins    Past Surgical History:  Procedure Laterality Date   BIOPSY  04/18/2018   Procedure: BIOPSY;  Surgeon: Ronnette Juniper, MD;  Location: [facility];  Service: Gastroenterology;;   ESOPHAGOGASTRODUODENOSCOPY N/A 04/18/2018   Procedure: ESOPHAGOGASTRODUODENOSCOPY (EGD);  Surgeon: Ronnette Juniper, MD;  Location: [facility];  Service: Gastroenterology;  Laterality: N/A;   ESOPHAGOGASTRODUODENOSCOPY (EGD) WITH PROPOFOL N/A 01/05/2015   Procedure:  ESOPHAGOGASTRODUODENOSCOPY (EGD) WITH PROPOFOL;  Surgeon: Garlan Fair, MD;  [facility]: [facility];  Service: Endoscopy;  Laterality: N/A;   EXTRACORPOREAL SHOCK WAVE LITHOTRIPSY Left 08/11/2021   Procedure: LEFT EXTRACORPOREAL SHOCK WAVE LITHOTRIPSY (ESWL);  Surgeon: Janith Lima, MD;  Location: [facility];  Service: Urology;  Laterality: Left;   GASTRIC ROUX-EN-Y     '08-[name] (weight stable around 302 #   KNEE ARTHROSCOPY     TONSILLECTOMY     TOTAL KNEE ARTHROPLASTY Left 06/03/2021   Procedure: LEFT TOTAL KNEE ARTHROPLASTY;  Surgeon: Newt Minion, MD;  Location: [place];  Service: Orthopedics;  Laterality: Left;   ULNAR NERVE TRANSPOSITION Right 15 yrs ago   Patient Active Problem List   Diagnosis Date Noted   Total knee replacement status, 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   Unilateral primary osteoarthritis, left knee    Longstanding persistent atrial fibrillation ([place]) 04/26/2021   Preop cardiovascular exam 04/26/2021   Orthostatic hypotension 02/20/2019   Chronic anticoagulation 03/05/2015   Abnormal chest x-ray with multiple lung nodules 12/14/2014   AS (aortic stenosis) 12/14/2014   Atrial flutter by electrocardiogram ([place]) 12/14/2014   CAD (coronary artery disease) 12/14/2014   DJD (degenerative joint disease) 12/14/2014   History of GI bleed  12/14/2014   Nephrolithiasis 12/14/2014   Other activity(E029.9) 12/14/2014   Prediabetes 12/14/2014   Thrombocytopenia ([place]) 12/14/2014   Arterial blood pressure decreased 10/28/2014   Atrial fibrillation ([place]) 09/02/2014   Morbid obesity ([place]) 09/02/2014   AF (paroxysmal atrial fibrillation) ([place]) 08/03/2014   Gastroduodenal ulcer 08/03/2014   Apnea, sleep 08/03/2014   Glaucoma suspect 02/21/2013   Glaucoma suspect of both eyes 02/21/2013   Herpes 04/11/2012   Routine general medical examination at a health care facility 04/11/2012   DYSLIPIDEMIA 05/31/2009   DEPRESSION/ANXIETY 05/31/2009   ATTENTION DEFICIT DISORDER  05/31/2009   Obstructive sleep apnea 05/31/2009   GERD 05/31/2009   GOUT, HX OF 05/31/2009    REFERRING DIAG: Unspecified fall, Other abnormalities of gait and mobility  THERAPY DIAG:  Other abnormalities of gait and mobility  Muscle weakness (generalized)  History of falling  Rationale for Evaluation and Treatment Rehabilitation  PERTINENT HISTORY: Left TKA 06/03/2021, ADD, OCD, anxiety, depression  PRECAUTIONS: Fall   SUBJECTIVE: Patient reports he is doing well. He has been feeling like his energy levels have been low. He has been consistent with his exercises and swimming in the pool.   PAIN:  Are you having pain? No  PATIENT GOALS: Improve balance and falls   OBJECTIVE: (objective measures completed at initial evaluation unless otherwise dated) PATIENT SURVEYS:  FOTO 49% functional status  04/12/2022: 52%   SENSATION: Patient reports numbness due to neuropathy   POSTURE:            Rounded shoulder posture   LOWER EXTREMITY MMT:   MMT Right eval Left eval Rt / Lt 03/09/2022  Hip flexion 4 4   Hip extension 4- 4-   Hip abduction 4- 4- 4- / 4-  Knee flexion 5 5   Knee extension 5 5   Ankle dorsiflexion 5 5   Ankle plantarflexion 4 4     FUNCTIONAL TESTS:  5 times sit to stand: 14 seconds  03/22/2022: 12 seconds 6 minute walk test: 1235 ft Berg Balance Scale: 52/56   GAIT: Distance walked: 1235 Assistive device utilized: None Level of assistance: Complete Independence Comments: patient with left trunk lean, trendelenburg on leg, slightly antalgic on right, patient with occasional unsteady gait with one instance of poor foot clearance on left with stumble     TODAY'S TREATMENT: [facility] Adult PT Treatment:                                                DATE: 04/12/2022 Therapeutic Exercise: NuStep L7 x 5 min with LE/UE  while taking subjective Heel-toe raises 2 x 20 Sidestepping in // bars with blue below knees 2 x 3 lengths down/back Rockerboard  sagittal and frontal plane taps x 2 min each Forward 8" step-up 2 x 10 each without UE support 1/2 tandem on Airex 2 x 30 sec each Forward tandem walking in // bars x 2 lengths down/back   [facility] Adult PT Treatment:                                                DATE: 03/30/2022 Therapeutic Exercise: NuStep L6 x 5 min with LE/UE  while taking subjective SLR 2 x 10 each Bridge 2 x  10 Sidelying hip abduction 2 x 15 each Sit to stand without UE 2 x 10 - slow and controlled decent Forward 8" step-up 2 x 10 each without UE support Standing heel raises 2 x 20 Tandem stance 3 x 30 sec  [facility] Adult PT Treatment:                                                DATE: 03/22/2022 Therapeutic Exercise: NuStep L6 x 5 min with LE/UE  while taking subjective Sidelying hip abduction 2 x 15 each Goblet squat to elevated table holding 25# 3 x 10 Sit to stand without UE 2 x 10 Forward 8" step-up 2 x 10 each without UE support Standing heel raises 2 x 20 Tandem stance 3 x 30 sec   PATIENT EDUCATION:  Education details: HEP Person educated: Patient Education method: Consulting civil engineer, Demonstration, Corporate treasurer cues, Verbal cues Education comprehension: verbalized understanding, returned demonstration, verbal cues required, tactile cues required, and needs further education   HOME EXERCISE PROGRAM: Access Code: [email or web address]      ASSESSMENT: CLINICAL IMPRESSION: Patient tolerated therapy well with no adverse effects. Therapy focused primarily on standing exercises to progress strengthening and standing stability and balance with good tolerance. He does exhibit slight unsteadiness on unstable surface and with the tandem walking. He continues to demonstrate greater weakness of the right knee with step ups. He does report an improvement in his functional level on FOTO this visit. No changes made to HEP. Patient would benefit from continued skilled PT to progress his strength and balance in order to improve walking and  reduce fall risk.    OBJECTIVE IMPAIRMENTS Abnormal gait, decreased activity tolerance, decreased balance, decreased strength, impaired sensation, and pain.    ACTIVITY LIMITATIONS stairs and locomotion level   PARTICIPATION LIMITATIONS: shopping and community activity   PERSONAL FACTORS Behavior pattern, Past/current experiences, Time since onset of injury/illness/exacerbation, and 3+ comorbidities: see above  are also affecting patient's functional outcome.      GOALS: Goals reviewed with patient? Yes   SHORT TERM GOALS: Target date: 03/22/2022    Patient will be I with initial HEP in order to progress with therapy. Baseline: HEP provided at eval 03/22/2022: independent Goal status: MET   2.  PT will review FOTO with patient by 3rd visit in order to understand expected progress and outcome with therapy. Baseline: FOTO assessed at eval 03/22/2022: reviewed Goal status: MET   3.  Patient will demonstrate 5xSTS </= 12 sec in order to indicate improved LE strength and reduced fall risk Baseline: 14 sec 03/22/2022: 12 sec Goal status: MET   LONG TERM GOALS: Target date: 04/19/2022    Patient will be I with final HEP to maintain progress from PT. Baseline: HEP provided at eval Goal status: INITIAL   2.  Patient will report >/= 63% status on FOTO to indicate improved functional ability. Baseline: 49% functional status 04/12/2022: 52% Goal status: ONGOING   3.  Patient will perform 6MWT >/= 1635 ft in order to improve community access Baseline: 1235 ft Goal status: INITIAL   4.  Patient will demonstrate BERG >/= 55/56 in order to indicate improved balance and reduced fall risk Baseline: 52/56 Goal status: INITIAL     PLAN: PT FREQUENCY: 1x/week   PT DURATION: 8 weeks   PLANNED INTERVENTIONS: Therapeutic exercises, Therapeutic activity, Neuromuscular re-education, Balance training,  Gait training, Patient/Family education, Self Care, Joint mobilization, Stair training,  Aquatic Therapy, Dry Needling, Cryotherapy, Moist heat, Manual therapy, and Re-evaluation   PLAN FOR NEXT SESSION: Review HEP and progress PRN, initiate hip and LE strengthening progressing to closed chair exercises such as step-ups, balance training progressing to dynamic    Hilda Blades, PT, DPT, LAT, ATC 04/12/22  5:02 PM Phone: [phone] Fax: [phone]

## 2022-04-12 ENCOUNTER — Ambulatory Visit: Payer: Medicare Other | Admitting: Physical Therapy

## 2022-04-12 ENCOUNTER — Other Ambulatory Visit: Payer: Self-pay

## 2022-04-12 ENCOUNTER — Encounter: Payer: Self-pay | Admitting: Physical Therapy

## 2022-04-12 DIAGNOSIS — M6281 Muscle weakness (generalized): Secondary | ICD-10-CM

## 2022-04-12 DIAGNOSIS — Z9181 History of falling: Secondary | ICD-10-CM | POA: Diagnosis not present

## 2022-04-12 DIAGNOSIS — R2689 Other abnormalities of gait and mobility: Secondary | ICD-10-CM

## 2022-04-19 ENCOUNTER — Other Ambulatory Visit: Payer: Self-pay

## 2022-04-19 ENCOUNTER — Encounter: Payer: Self-pay | Admitting: Physical Therapy

## 2022-04-19 ENCOUNTER — Ambulatory Visit: Payer: Medicare Other | Admitting: Physical Therapy

## 2022-04-19 DIAGNOSIS — R2689 Other abnormalities of gait and mobility: Secondary | ICD-10-CM

## 2022-04-19 DIAGNOSIS — Z9181 History of falling: Secondary | ICD-10-CM

## 2022-04-19 DIAGNOSIS — M6281 Muscle weakness (generalized): Secondary | ICD-10-CM

## 2022-04-19 NOTE — Therapy (Signed)
OUTPATIENT PHYSICAL THERAPY TREATMENT NOTE   Patient Name: Francisco Padilla MRN: [id number] DOB:1950/09/25, 71 y.o., male Today's Date: 04/19/2022  PCP: Lajean Manes, MD   REFERRING PROVIDER: Kathalene Frames, MD   END OF SESSION:   PT End of Session - 04/19/22 1521     Visit Number 7    Number of Visits 15    Date for PT Re-Evaluation 06/14/22    Authorization Type MCR    Authorization Time Period FOTO by 10th, KX by 15th    Progress Note Due on Visit 10    PT Start Time 1525    PT Stop Time 1610    PT Time Calculation (min) 45 min    Activity Tolerance Patient tolerated treatment well    Behavior During Therapy [facility] for tasks assessed/performed                  Past Medical History:  Diagnosis Date   ADD (attention deficit disorder)    Anxiety 2007   Asthma as child   Asymptomatic gallstones    Atrial fibrillation (HCC)    Atrial flutter (HCC)    Depression    DJD (degenerative joint disease) of knee    Dysrhythmia    Chronic Atrial Fib- seeing Dr. Lenna Sciara [facility]   H/O peptic ulcer    past history with GI bleed- cauterization done throught scope   History of kidney stones    pt claims urology discovered a kidney stone last week 05/25/21   Hypercholesterolemia    Orthostatic hypotension    Pneumonia as child   Pneumothorax on left 05/20/2009   Prediabetes    none now   Pulmonary nodules    Sleep apnea    cpap set on 13   Thrombocytopenia ([place])    pt denies   Thyromegaly    Varicose veins    Past Surgical History:  Procedure Laterality Date   BIOPSY  04/18/2018   Procedure: BIOPSY;  Surgeon: Ronnette Juniper, MD;  [facility]: [facility];  Service: Gastroenterology;;   ESOPHAGOGASTRODUODENOSCOPY N/A 04/18/2018   Procedure: ESOPHAGOGASTRODUODENOSCOPY (EGD);  Surgeon: Ronnette Juniper, MD;  [facility];  Service: Gastroenterology;  Laterality: N/A;   ESOPHAGOGASTRODUODENOSCOPY (EGD) WITH PROPOFOL N/A 01/05/2015   Procedure:  ESOPHAGOGASTRODUODENOSCOPY (EGD) WITH PROPOFOL;  Surgeon: Garlan Fair, MD;  [facility]: [facility];  Service: Endoscopy;  Laterality: N/A;   EXTRACORPOREAL SHOCK WAVE LITHOTRIPSY Left 08/11/2021   Procedure: LEFT EXTRACORPOREAL SHOCK WAVE LITHOTRIPSY (ESWL);  Surgeon: Janith Lima, MD;  Location: [facility];  Service: Urology;  Laterality: Left;   GASTRIC ROUX-EN-Y     '08-[name] (weight stable around 302 #   KNEE ARTHROSCOPY     TONSILLECTOMY     TOTAL KNEE ARTHROPLASTY Left 06/03/2021   Procedure: LEFT TOTAL KNEE ARTHROPLASTY;  Surgeon: Newt Minion, MD;  Location: [place];  Service: Orthopedics;  Laterality: Left;   ULNAR NERVE TRANSPOSITION Right 15 yrs ago   Patient Active Problem List   Diagnosis Date Noted   Total knee replacement status, 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   Unilateral primary osteoarthritis, left knee    Longstanding persistent atrial fibrillation ([place]) 04/26/2021   Preop cardiovascular exam 04/26/2021   Orthostatic hypotension 02/20/2019   Chronic anticoagulation 03/05/2015   Abnormal chest x-ray with multiple lung nodules 12/14/2014   AS (aortic stenosis) 12/14/2014   Atrial flutter by electrocardiogram ([place]) 12/14/2014   CAD (coronary artery disease) 12/14/2014   DJD (degenerative joint disease) 12/14/2014   History of GI  bleed 12/14/2014   Nephrolithiasis 12/14/2014   Other activity(E029.9) 12/14/2014   Prediabetes 12/14/2014   Thrombocytopenia ([place]) 12/14/2014   Arterial blood pressure decreased 10/28/2014   Atrial fibrillation ([place]) 09/02/2014   Morbid obesity ([place]) 09/02/2014   AF (paroxysmal atrial fibrillation) ([place]) 08/03/2014   Gastroduodenal ulcer 08/03/2014   Apnea, sleep 08/03/2014   Glaucoma suspect 02/21/2013   Glaucoma suspect of both eyes 02/21/2013   Herpes 04/11/2012   Routine general medical examination at a health care facility 04/11/2012   DYSLIPIDEMIA 05/31/2009   DEPRESSION/ANXIETY 05/31/2009   ATTENTION DEFICIT DISORDER  05/31/2009   Obstructive sleep apnea 05/31/2009   GERD 05/31/2009   GOUT, HX OF 05/31/2009    REFERRING DIAG: Unspecified fall, Other abnormalities of gait and mobility  THERAPY DIAG:  Other abnormalities of gait and mobility  Muscle weakness (generalized)  History of falling  Rationale for Evaluation and Treatment Rehabilitation  PERTINENT HISTORY: Left TKA 06/03/2021, ADD, OCD, anxiety, depression  PRECAUTIONS: Fall   SUBJECTIVE: Patient reports he is doing well. He does feel clumsy when walking due to neuropathy.  PAIN:  Are you having pain? No  PATIENT GOALS: Improve balance and falls   OBJECTIVE: (objective measures completed at initial evaluation unless otherwise dated) PATIENT SURVEYS:  FOTO 49% functional status  04/12/2022: 52%   SENSATION: Patient reports numbness due to neuropathy   POSTURE:            Rounded shoulder posture   LOWER EXTREMITY MMT:   MMT Right eval Left eval Rt / Lt 03/09/2022 Rt / Lt 04/19/2022  Hip flexion 4 4    Hip extension 4- 4-    Hip abduction 4- 4- 4- / 4-   Knee flexion 5 5    Knee extension 5 5    Ankle dorsiflexion 5 5    Ankle plantarflexion 4 4      FUNCTIONAL TESTS:  5 times sit to stand: 14 seconds  03/22/2022: 12 seconds 6 minute walk test: 1235 ft  04/19/2022: 1,460 ft Berg Balance Scale: 52/56  04/19/2022:    BERG BALANCE TEST Sitting to Standing: 4.      Stands without using hands and stabilize independently Standing Unsupported: 4.      Stands safely for 2 minutes Sitting Unsupported: 4.     Sits for 2 minutes independently Standing to Sitting: 4.     Sits safely with minimal use of hands Transfers: 4.     Transfers safely with minor use of hands Standing with eyes closed: 4.     Stands safely for 10 seconds  Standing with feet together: 4.     Stands for 1 minute safely Reaching forward with outstretched arm: 4.     Reaches forward 10 inches Retrieving object from the floor: 4.      Able to pick up  easily and safely Turning to look behind: 4.     Looks behind from both sides and weight shifts well Turning 360 degrees: 4.     Able to turn in </=4 seconds  Place alternate foot on stool: 4.     Completes 8 steps in 20 seconds     Standing with one foot in front: 3.     Independent foot ahead for 30 seconds Standing on one foot: 3.     Holds 5-10 seconds Total Score: 54/56  GAIT: Distance walked: 1235 Assistive device utilized: None Level of assistance: Complete Independence Comments: patient with left trunk lean, trendelenburg on leg, slightly antalgic  on right, patient with occasional unsteady gait with one instance of poor foot clearance on left with stumble     TODAY'S TREATMENT: [facility] Adult PT Treatment:                                                DATE: 04/19/2022 Therapeutic Exercise: NuStep L7 x 5 min with LE/UE  while taking subjective Bridge 2 x 10 SLR 2 x 10 each Sidelying hip abduction 2 x 10 each Leg press (cybex) 100# 3 x 10 Heel-toe raises 2 x 20 Therapeutic Activity: Reassessment toward goals including BERG and 6MWT   [facility] Adult PT Treatment:                                                DATE: 04/12/2022 Therapeutic Exercise: NuStep L7 x 5 min with LE/UE  while taking subjective Heel-toe raises 2 x 20 Sidestepping in // bars with blue below knees 2 x 3 lengths down/back Rockerboard sagittal and frontal plane taps x 2 min each Forward 8" step-up 2 x 10 each without UE support 1/2 tandem on Airex 2 x 30 sec each Forward tandem walking in // bars x 2 lengths down/back  [facility] Adult PT Treatment:                                                DATE: 03/30/2022 Therapeutic Exercise: NuStep L6 x 5 min with LE/UE  while taking subjective SLR 2 x 10 each Bridge 2 x 10 Sidelying hip abduction 2 x 15 each Sit to stand without UE 2 x 10 - slow and controlled decent Forward 8" step-up 2 x 10 each without UE support Standing heel raises 2 x 20 Tandem stance 3 x 30 sec    PATIENT EDUCATION:  Education details: HEP Person educated: Patient Education method: Consulting civil engineer, Demonstration, Corporate treasurer cues, Verbal cues Education comprehension: verbalized understanding, returned demonstration, verbal cues required, tactile cues required, and needs further education   HOME EXERCISE PROGRAM: Access Code: [email or web address]      ASSESSMENT: CLINICAL IMPRESSION: Patient tolerated therapy well with no adverse effects. He is progressing well towards his goals and demonstrates improved walking ability with 6MWT, balance on BERG, and improved functional ability on FOTO. He does continue to exhibit limitations with his walking and balance, and continues to progress with strengthening. Patient requires cueing for exercise technique and control. No changes to HEP this visit. Patient would benefit from continued skilled PT to progress his strength and balance in order to improve walking and reduce fall risk, so will extend PT POC for 8 more weeks.    OBJECTIVE IMPAIRMENTS Abnormal gait, decreased activity tolerance, decreased balance, decreased strength, impaired sensation, and pain.    ACTIVITY LIMITATIONS stairs and locomotion level   PARTICIPATION LIMITATIONS: shopping and community activity   PERSONAL FACTORS Behavior pattern, Past/current experiences, Time since onset of injury/illness/exacerbation, and 3+ comorbidities: see above  are also affecting patient's functional outcome.      GOALS: Goals reviewed with patient? Yes   SHORT TERM GOALS: Target date: 03/22/2022    Patient will be  I with initial HEP in order to progress with therapy. Baseline: HEP provided at eval 03/22/2022: independent Goal status: MET   2.  PT will review FOTO with patient by 3rd visit in order to understand expected progress and outcome with therapy. Baseline: FOTO assessed at eval 03/22/2022: reviewed Goal status: MET   3.  Patient will demonstrate 5xSTS </= 12 sec in order to indicate improved LE  strength and reduced fall risk Baseline: 14 sec 03/22/2022: 12 sec Goal status: MET   LONG TERM GOALS: Target date: 06/14/2022   Patient will be I with final HEP to maintain progress from PT. Baseline: HEP provided at eval 04/19/2022: independent Goal status: ONGOING   2.  Patient will report >/= 63% status on FOTO to indicate improved functional ability. Baseline: 49% functional status 04/12/2022: 52% Goal status: PARTIALLY MET   3.  Patient will perform 6MWT >/= 1635 ft in order to improve community access Baseline: 1235 ft 04/19/2022: 1,460 ft Goal status: PARTIALLY MET   4.  Patient will demonstrate BERG >/= 55/56 in order to indicate improved balance and reduced fall risk Baseline: 52/56 04/19/2022: 54/56 Goal status: PARTIALLY MET     PLAN: PT FREQUENCY: 1x/week   PT DURATION: 8 weeks   PLANNED INTERVENTIONS: Therapeutic exercises, Therapeutic activity, Neuromuscular re-education, Balance training, Gait training, Patient/Family education, Self Care, Joint mobilization, Stair training, Aquatic Therapy, Dry Needling, Cryotherapy, Moist heat, Manual therapy, and Re-evaluation   PLAN FOR NEXT SESSION: Review HEP and progress PRN, initiate hip and LE strengthening progressing to closed chair exercises such as step-ups, balance training progressing to dynamic    Hilda Blades, PT, DPT, LAT, ATC 04/19/22  4:30 PM Phone: [phone] Fax: [phone]

## 2022-04-25 NOTE — Therapy (Signed)
OUTPATIENT PHYSICAL THERAPY TREATMENT NOTE   Patient Name: Francisco Padilla MRN: [id number] DOB:21-Feb-1951, 71 y.o., male Today's Date: 04/26/2022  PCP: Lajean Manes, MD   REFERRING PROVIDER: Kathalene Frames, MD   END OF SESSION:   PT End of Session - 04/26/22 1447     Visit Number 8    Number of Visits 15    Date for PT Re-Evaluation 06/14/22    Authorization Type MCR    Authorization Time Period FOTO by 10th, KX by 15th    Progress Note Due on Visit 10    PT Start Time 1445    PT Stop Time 1530    PT Time Calculation (min) 45 min    Activity Tolerance Patient tolerated treatment well    Behavior During Therapy [facility] for tasks assessed/performed                   Past Medical History:  Diagnosis Date   ADD (attention deficit disorder)    Anxiety 2007   Asthma as child   Asymptomatic gallstones    Atrial fibrillation (HCC)    Atrial flutter (HCC)    Depression    DJD (degenerative joint disease) of knee    Dysrhythmia    Chronic Atrial Fib- seeing Dr. Lenna Sciara [facility]   H/O peptic ulcer    past history with GI bleed- cauterization done throught scope   History of kidney stones    pt claims urology discovered a kidney stone last week 05/25/21   Hypercholesterolemia    Orthostatic hypotension    Pneumonia as child   Pneumothorax on left 05/20/2009   Prediabetes    none now   Pulmonary nodules    Sleep apnea    cpap set on 13   Thrombocytopenia ([place])    pt denies   Thyromegaly    Varicose veins    Past Surgical History:  Procedure Laterality Date   BIOPSY  04/18/2018   Procedure: BIOPSY;  Surgeon: Ronnette Juniper, MD;  Location: [facility];  Service: Gastroenterology;;   ESOPHAGOGASTRODUODENOSCOPY N/A 04/18/2018   Procedure: ESOPHAGOGASTRODUODENOSCOPY (EGD);  Surgeon: Ronnette Juniper, MD;  Location: [facility];  Service: Gastroenterology;  Laterality: N/A;   ESOPHAGOGASTRODUODENOSCOPY (EGD) WITH PROPOFOL N/A 01/05/2015   Procedure:  ESOPHAGOGASTRODUODENOSCOPY (EGD) WITH PROPOFOL;  Surgeon: Garlan Fair, MD;  Location: [facility];  Service: Endoscopy;  Laterality: N/A;   EXTRACORPOREAL SHOCK WAVE LITHOTRIPSY Left 08/11/2021   Procedure: LEFT EXTRACORPOREAL SHOCK WAVE LITHOTRIPSY (ESWL);  Surgeon: Janith Lima, MD;  Location: [facility];  Service: Urology;  Laterality: Left;   GASTRIC ROUX-EN-Y     '08-[name] (weight stable around 302 #   KNEE ARTHROSCOPY     TONSILLECTOMY     TOTAL KNEE ARTHROPLASTY Left 06/03/2021   Procedure: LEFT TOTAL KNEE ARTHROPLASTY;  Surgeon: Newt Minion, MD;  Location: [place];  Service: Orthopedics;  Laterality: Left;   ULNAR NERVE TRANSPOSITION Right 15 yrs ago   Patient Active Problem List   Diagnosis Date Noted   Total knee replacement status, 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   Unilateral primary osteoarthritis, left knee    Longstanding persistent atrial fibrillation ([place]) 04/26/2021   Preop cardiovascular exam 04/26/2021   Orthostatic hypotension 02/20/2019   Chronic anticoagulation 03/05/2015   Abnormal chest x-ray with multiple lung nodules 12/14/2014   AS (aortic stenosis) 12/14/2014   Atrial flutter by electrocardiogram ([place]) 12/14/2014   CAD (coronary artery disease) 12/14/2014   DJD (degenerative joint disease) 12/14/2014   History of  GI bleed 12/14/2014   Nephrolithiasis 12/14/2014   Other activity(E029.9) 12/14/2014   Prediabetes 12/14/2014   Thrombocytopenia ([place]) 12/14/2014   Arterial blood pressure decreased 10/28/2014   Atrial fibrillation ([place]) 09/02/2014   Morbid obesity ([place]) 09/02/2014   AF (paroxysmal atrial fibrillation) ([place]) 08/03/2014   Gastroduodenal ulcer 08/03/2014   Apnea, sleep 08/03/2014   Glaucoma suspect 02/21/2013   Glaucoma suspect of both eyes 02/21/2013   Herpes 04/11/2012   Routine general medical examination at a health care facility 04/11/2012   DYSLIPIDEMIA 05/31/2009   DEPRESSION/ANXIETY 05/31/2009   ATTENTION DEFICIT DISORDER  05/31/2009   Obstructive sleep apnea 05/31/2009   GERD 05/31/2009   GOUT, HX OF 05/31/2009    REFERRING DIAG: Unspecified fall, Other abnormalities of gait and mobility  THERAPY DIAG:  Other abnormalities of gait and mobility  Muscle weakness (generalized)  History of falling  Rationale for Evaluation and Treatment Rehabilitation  PERTINENT HISTORY: Left TKA 06/03/2021, ADD, OCD, anxiety, depression  PRECAUTIONS: Fall   SUBJECTIVE: Patient reports his knees are feeling stiff this visit.   PAIN:  Are you having pain? No  PATIENT GOALS: Improve balance and falls   OBJECTIVE: (objective measures completed at initial evaluation unless otherwise dated) PATIENT SURVEYS:  FOTO 49% functional status  04/12/2022: 52%   SENSATION: Patient reports numbness due to neuropathy   POSTURE:            Rounded shoulder posture   LOWER EXTREMITY MMT:   MMT Right eval Left eval Rt / Lt 03/09/2022 Rt / Lt 04/19/2022  Hip flexion 4 4    Hip extension 4- 4-    Hip abduction 4- 4- 4- / 4-   Knee flexion 5 5    Knee extension 5 5    Ankle dorsiflexion 5 5    Ankle plantarflexion 4 4      FUNCTIONAL TESTS:  5 times sit to stand: 14 seconds  03/22/2022: 12 seconds 6 minute walk test: 1235 ft  04/19/2022: 1,460 ft Berg Balance Scale: 52/56  04/19/2022: 54/56  GAIT: Distance walked: 1235 Assistive device utilized: None Level of assistance: Complete Independence Comments: patient with left trunk lean, trendelenburg on leg, slightly antalgic on right, patient with occasional unsteady gait with one instance of poor foot clearance on left with stumble     TODAY'S TREATMENT: [facility] Adult PT Treatment:                                                DATE: 04/26/2022 Therapeutic Exercise: NuStep L7 x 5 min with LE/UE  while taking subjective Bridge 2 x 10 SLR 2 x 10 each Sidelying hip abduction 2 x 15 each Heel-toe raises 2 x 20 Leg press (cybex) 100# 3 x 10 Neuro Reed: Romberg on  Airex 2 x 60 seconds Tandem stance 2 x 30 sec each Rockerboard in frontal and sagittal plane x 60 sec each   [facility] Adult PT Treatment:                                                DATE: 04/19/2022 Therapeutic Exercise: NuStep L7 x 5 min with LE/UE  while taking subjective Bridge 2 x 10 SLR 2 x 10 each Sidelying hip abduction  2 x 10 each Leg press (cybex) 100# 3 x 10 Heel-toe raises 2 x 20 Therapeutic Activity: Reassessment toward goals including BERG and 6MWT  [facility] Adult PT Treatment:                                                DATE: 04/12/2022 Therapeutic Exercise: NuStep L7 x 5 min with LE/UE  while taking subjective Heel-toe raises 2 x 20 Sidestepping in // bars with blue below knees 2 x 3 lengths down/back Rockerboard sagittal and frontal plane taps x 2 min each Forward 8" step-up 2 x 10 each without UE support 1/2 tandem on Airex 2 x 30 sec each Forward tandem walking in // bars x 2 lengths down/back   PATIENT EDUCATION:  Education details: HEP Person educated: Patient Education method: Consulting civil engineer, Media planner, Corporate treasurer cues, Verbal cues Education comprehension: verbalized understanding, returned demonstration, verbal cues required, tactile cues required, and needs further education   HOME EXERCISE PROGRAM: Access Code: [email or web address]      ASSESSMENT: CLINICAL IMPRESSION: Patient tolerated therapy well with no adverse effects. Therapy focused on progressing strength and standing balance and stability this visit. He does continue to require UE support with balance exercises. He did report being worn out following last visit so did not increase resistance with strengthening. Patient would benefit from continued skilled PT to progress his strength and balance in order to improve walking and reduce fall risk.    OBJECTIVE IMPAIRMENTS Abnormal gait, decreased activity tolerance, decreased balance, decreased strength, impaired sensation, and pain.    ACTIVITY LIMITATIONS  stairs and locomotion level   PARTICIPATION LIMITATIONS: shopping and community activity   PERSONAL FACTORS Behavior pattern, Past/current experiences, Time since onset of injury/illness/exacerbation, and 3+ comorbidities: see above  are also affecting patient's functional outcome.      GOALS: Goals reviewed with patient? Yes   SHORT TERM GOALS: Target date: 03/22/2022    Patient will be I with initial HEP in order to progress with therapy. Baseline: HEP provided at eval 03/22/2022: independent Goal status: MET   2.  PT will review FOTO with patient by 3rd visit in order to understand expected progress and outcome with therapy. Baseline: FOTO assessed at eval 03/22/2022: reviewed Goal status: MET   3.  Patient will demonstrate 5xSTS </= 12 sec in order to indicate improved LE strength and reduced fall risk Baseline: 14 sec 03/22/2022: 12 sec Goal status: MET   LONG TERM GOALS: Target date: 06/14/2022   Patient will be I with final HEP to maintain progress from PT. Baseline: HEP provided at eval 04/19/2022: independent Goal status: ONGOING   2.  Patient will report >/= 63% status on FOTO to indicate improved functional ability. Baseline: 49% functional status 04/12/2022: 52% Goal status: PARTIALLY MET   3.  Patient will perform 6MWT >/= 1635 ft in order to improve community access Baseline: 1235 ft 04/19/2022: 1,460 ft Goal status: PARTIALLY MET   4.  Patient will demonstrate BERG >/= 55/56 in order to indicate improved balance and reduced fall risk Baseline: 52/56 04/19/2022: 54/56 Goal status: PARTIALLY MET     PLAN: PT FREQUENCY: 1x/week   PT DURATION: 8 weeks   PLANNED INTERVENTIONS: Therapeutic exercises, Therapeutic activity, Neuromuscular re-education, Balance training, Gait training, Patient/Family education, Self Care, Joint mobilization, Stair training, Aquatic Therapy, Dry Needling, Cryotherapy, Moist heat, Manual therapy, and Re-evaluation  PLAN FOR NEXT  SESSION: Review HEP and progress PRN, initiate hip and LE strengthening progressing to closed chair exercises such as step-ups, balance training progressing to dynamic    Hilda Blades, PT, DPT, LAT, ATC 04/26/22  3:31 PM Phone: [phone] Fax: [phone]

## 2022-04-26 ENCOUNTER — Other Ambulatory Visit: Payer: Self-pay

## 2022-04-26 ENCOUNTER — Encounter: Payer: Self-pay | Admitting: Physical Therapy

## 2022-04-26 ENCOUNTER — Ambulatory Visit: Payer: Medicare Other | Attending: Internal Medicine | Admitting: Physical Therapy

## 2022-04-26 DIAGNOSIS — R2689 Other abnormalities of gait and mobility: Secondary | ICD-10-CM | POA: Diagnosis not present

## 2022-04-26 DIAGNOSIS — Z9181 History of falling: Secondary | ICD-10-CM

## 2022-04-26 DIAGNOSIS — M6281 Muscle weakness (generalized): Secondary | ICD-10-CM | POA: Diagnosis not present

## 2022-04-27 DIAGNOSIS — F900 Attention-deficit hyperactivity disorder, predominantly inattentive type: Secondary | ICD-10-CM | POA: Diagnosis not present

## 2022-04-27 DIAGNOSIS — F411 Generalized anxiety disorder: Secondary | ICD-10-CM | POA: Diagnosis not present

## 2022-04-27 DIAGNOSIS — R4589 Other symptoms and signs involving emotional state: Secondary | ICD-10-CM | POA: Diagnosis not present

## 2022-05-02 NOTE — Therapy (Signed)
OUTPATIENT PHYSICAL THERAPY TREATMENT NOTE   Patient Name: Francisco Padilla MRN: [id number] DOB:11-28-1950, 71 y.o., male Today's Date: 05/03/2022  PCP: Lajean Manes, MD   REFERRING PROVIDER: Kathalene Frames, MD   END OF SESSION:   PT End of Session - 05/03/22 1455     Visit Number 9    Number of Visits 15    Date for PT Re-Evaluation 06/14/22    Authorization Type MCR    Authorization Time Period FOTO by 10th, KX by 15th    Progress Note Due on Visit 10    PT Start Time 1450    PT Stop Time 1530    PT Time Calculation (min) 40 min    Activity Tolerance Patient tolerated treatment well    Behavior During Therapy [facility] for tasks assessed/performed                    Past Medical History:  Diagnosis Date   ADD (attention deficit disorder)    Anxiety 2007   Asthma as child   Asymptomatic gallstones    Atrial fibrillation (HCC)    Atrial flutter (HCC)    Depression    DJD (degenerative joint disease) of knee    Dysrhythmia    Chronic Atrial Fib- seeing Dr. Lenna Sciara [facility]   H/O peptic ulcer    past history with GI bleed- cauterization done throught scope   History of kidney stones    pt claims urology discovered a kidney stone last week 05/25/21   Hypercholesterolemia    Orthostatic hypotension    Pneumonia as child   Pneumothorax on left 05/20/2009   Prediabetes    none now   Pulmonary nodules    Sleep apnea    cpap set on 13   Thrombocytopenia ([place])    pt denies   Thyromegaly    Varicose veins    Past Surgical History:  Procedure Laterality Date   BIOPSY  04/18/2018   Procedure: BIOPSY;  Surgeon: Ronnette Juniper, MD;  [facility];  Service: Gastroenterology;;   ESOPHAGOGASTRODUODENOSCOPY N/A 04/18/2018   Procedure: ESOPHAGOGASTRODUODENOSCOPY (EGD);  Surgeon: Ronnette Juniper, MD;  Location: [facility];  Service: Gastroenterology;  Laterality: N/A;   ESOPHAGOGASTRODUODENOSCOPY (EGD) WITH PROPOFOL N/A 01/05/2015   Procedure:  ESOPHAGOGASTRODUODENOSCOPY (EGD) WITH PROPOFOL;  Surgeon: Garlan Fair, MD;  [facility];  Service: Endoscopy;  Laterality: N/A;   EXTRACORPOREAL SHOCK WAVE LITHOTRIPSY Left 08/11/2021   Procedure: LEFT EXTRACORPOREAL SHOCK WAVE LITHOTRIPSY (ESWL);  Surgeon: Janith Lima, MD;  Location: [facility];  Service: Urology;  Laterality: Left;   GASTRIC ROUX-EN-Y     '08-[name] (weight stable around 302 #   KNEE ARTHROSCOPY     TONSILLECTOMY     TOTAL KNEE ARTHROPLASTY Left 06/03/2021   Procedure: LEFT TOTAL KNEE ARTHROPLASTY;  Surgeon: Newt Minion, MD;  Location: [place];  Service: Orthopedics;  Laterality: Left;   ULNAR NERVE TRANSPOSITION Right 15 yrs ago   Patient Active Problem List   Diagnosis Date Noted   Total knee replacement status, 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   Unilateral primary osteoarthritis, left knee    Longstanding persistent atrial fibrillation ([place]) 04/26/2021   Preop cardiovascular exam 04/26/2021   Orthostatic hypotension 02/20/2019   Chronic anticoagulation 03/05/2015   Abnormal chest x-ray with multiple lung nodules 12/14/2014   AS (aortic stenosis) 12/14/2014   Atrial flutter by electrocardiogram ([place]) 12/14/2014   CAD (coronary artery disease) 12/14/2014   DJD (degenerative joint disease) 12/14/2014   History  of GI bleed 12/14/2014   Nephrolithiasis 12/14/2014   Other activity(E029.9) 12/14/2014   Prediabetes 12/14/2014   Thrombocytopenia ([place]) 12/14/2014   Arterial blood pressure decreased 10/28/2014   Atrial fibrillation ([place]) 09/02/2014   Morbid obesity ([place]) 09/02/2014   AF (paroxysmal atrial fibrillation) ([place]) 08/03/2014   Gastroduodenal ulcer 08/03/2014   Apnea, sleep 08/03/2014   Glaucoma suspect 02/21/2013   Glaucoma suspect of both eyes 02/21/2013   Herpes 04/11/2012   Routine general medical examination at a health care facility 04/11/2012   DYSLIPIDEMIA 05/31/2009   DEPRESSION/ANXIETY 05/31/2009   ATTENTION DEFICIT DISORDER  05/31/2009   Obstructive sleep apnea 05/31/2009   GERD 05/31/2009   GOUT, HX OF 05/31/2009    REFERRING DIAG: Unspecified fall, Other abnormalities of gait and mobility  THERAPY DIAG:  Other abnormalities of gait and mobility  Muscle weakness (generalized)  History of falling  Rationale for Evaluation and Treatment Rehabilitation  PERTINENT HISTORY: Left TKA 06/03/2021, ADD, OCD, anxiety, depression  PRECAUTIONS: Fall   SUBJECTIVE: Patient reports his knees are feeling stiff this visit.   PAIN:  Are you having pain? No  PATIENT GOALS: Improve balance and falls   OBJECTIVE: (objective measures completed at initial evaluation unless otherwise dated) PATIENT SURVEYS:  FOTO 49% functional status  04/12/2022: 52%   SENSATION: Patient reports numbness due to neuropathy   POSTURE:            Rounded shoulder posture   LOWER EXTREMITY MMT:   MMT Right eval Left eval Rt / Lt 03/09/2022 Rt / Lt 04/19/2022  Hip flexion 4 4    Hip extension 4- 4-    Hip abduction 4- 4- 4- / 4-   Knee flexion 5 5    Knee extension 5 5    Ankle dorsiflexion 5 5    Ankle plantarflexion 4 4      FUNCTIONAL TESTS:  5 times sit to stand: 14 seconds  03/22/2022: 12 seconds 6 minute walk test: 1235 ft  04/19/2022: 1,460 ft Berg Balance Scale: 52/56  04/19/2022: 54/56  GAIT: Distance walked: 1235 Assistive device utilized: None Level of assistance: Complete Independence Comments: patient with left trunk lean, trendelenburg on leg, slightly antalgic on right, patient with occasional unsteady gait with one instance of poor foot clearance on left with stumble     TODAY'S TREATMENT: [facility] Adult PT Treatment:                                                DATE: 05/03/2022 Therapeutic Exercise: NuStep L7 x 5 min with LE/UE  while taking subjective Knee extension machine 35# 3 x 10 Leg press (cybex) 100# x 10, 120# 2 x 10 Sidelying hip abduction 2 x 15 each Figure-4 bridge 2 x 10  each   [facility] Adult PT Treatment:                                                DATE: 04/26/2022 Therapeutic Exercise: NuStep L7 x 5 min with LE/UE  while taking subjective Bridge 2 x 10 SLR 2 x 10 each Sidelying hip abduction 2 x 15 each Heel-toe raises 2 x 20 Leg press (cybex) 100# 3 x 10 Neuro Reed: Romberg on Airex 2 x  60 seconds Tandem stance 2 x 30 sec each Rockerboard in frontal and sagittal plane x 60 sec each  [facility] Adult PT Treatment:                                                DATE: 04/19/2022 Therapeutic Exercise: NuStep L7 x 5 min with LE/UE  while taking subjective Bridge 2 x 10 SLR 2 x 10 each Sidelying hip abduction 2 x 10 each Leg press (cybex) 100# 3 x 10 Heel-toe raises 2 x 20 Therapeutic Activity: Reassessment toward goals including BERG and 6MWT   PATIENT EDUCATION:  Education details: HEP Person educated: Patient Education method: Explanation, Demonstration, Tactile cues, Verbal cues Education comprehension: verbalized understanding, returned demonstration, verbal cues required, tactile cues required, and needs further education   HOME EXERCISE PROGRAM: Access Code: [email or web address]      ASSESSMENT: CLINICAL IMPRESSION: Patient tolerated therapy well with no adverse effects. Therapy focused primarily on strengthening this visit with good tolerance. He was able to tolerate increased resistance with strengthening and able to perform with proper technique. No pain reported with therapy and he seems to be improving. No changes to HEP this visit. Patient would benefit from continued skilled PT to progress his strength and balance in order to improve walking and reduce fall risk.    OBJECTIVE IMPAIRMENTS Abnormal gait, decreased activity tolerance, decreased balance, decreased strength, impaired sensation, and pain.    ACTIVITY LIMITATIONS stairs and locomotion level   PARTICIPATION LIMITATIONS: shopping and community activity   PERSONAL FACTORS Behavior  pattern, Past/current experiences, Time since onset of injury/illness/exacerbation, and 3+ comorbidities: see above  are also affecting patient's functional outcome.      GOALS: Goals reviewed with patient? Yes   SHORT TERM GOALS: Target date: 03/22/2022    Patient will be I with initial HEP in order to progress with therapy. Baseline: HEP provided at eval 03/22/2022: independent Goal status: MET   2.  PT will review FOTO with patient by 3rd visit in order to understand expected progress and outcome with therapy. Baseline: FOTO assessed at eval 03/22/2022: reviewed Goal status: MET   3.  Patient will demonstrate 5xSTS </= 12 sec in order to indicate improved LE strength and reduced fall risk Baseline: 14 sec 03/22/2022: 12 sec Goal status: MET   LONG TERM GOALS: Target date: 06/14/2022   Patient will be I with final HEP to maintain progress from PT. Baseline: HEP provided at eval 04/19/2022: independent Goal status: ONGOING   2.  Patient will report >/= 63% status on FOTO to indicate improved functional ability. Baseline: 49% functional status 04/12/2022: 52% Goal status: PARTIALLY MET   3.  Patient will perform 6MWT >/= 1635 ft in order to improve community access Baseline: 1235 ft 04/19/2022: 1,460 ft Goal status: PARTIALLY MET   4.  Patient will demonstrate BERG >/= 55/56 in order to indicate improved balance and reduced fall risk Baseline: 52/56 04/19/2022: 54/56 Goal status: PARTIALLY MET     PLAN: PT FREQUENCY: 1x/week   PT DURATION: 8 weeks   PLANNED INTERVENTIONS: Therapeutic exercises, Therapeutic activity, Neuromuscular re-education, Balance training, Gait training, Patient/Family education, Self Care, Joint mobilization, Stair training, Aquatic Therapy, Dry Needling, Cryotherapy, Moist heat, Manual therapy, and Re-evaluation   PLAN FOR NEXT SESSION: Review HEP and progress PRN, initiate hip and LE strengthening progressing to closed chair exercises such  as  step-ups, balance training progressing to dynamic    Hilda Blades, PT, DPT, LAT, ATC 05/03/22  3:31 PM Phone: [phone] Fax: [phone]

## 2022-05-03 ENCOUNTER — Encounter: Payer: Self-pay | Admitting: Physical Therapy

## 2022-05-03 ENCOUNTER — Other Ambulatory Visit: Payer: Self-pay

## 2022-05-03 ENCOUNTER — Ambulatory Visit: Payer: Medicare Other | Admitting: Physical Therapy

## 2022-05-03 DIAGNOSIS — Z9181 History of falling: Secondary | ICD-10-CM

## 2022-05-03 DIAGNOSIS — M6281 Muscle weakness (generalized): Secondary | ICD-10-CM | POA: Diagnosis not present

## 2022-05-03 DIAGNOSIS — R2689 Other abnormalities of gait and mobility: Secondary | ICD-10-CM

## 2022-05-09 NOTE — Therapy (Signed)
OUTPATIENT PHYSICAL THERAPY TREATMENT NOTE  Progress Note Reporting Period 02/22/2022 to 05/10/2022  See note below for Objective Data and Assessment of Progress/Goals.     Patient Name: Francisco Padilla MRN: [id number] DOB:1951/04/18, 71 y.o., male Today's Date: 05/10/2022  PCP: Lajean Manes, MD   REFERRING PROVIDER: Kathalene Frames, MD   END OF SESSION:   PT End of Session - 05/10/22 1411     Visit Number 10    Number of Visits 15    Date for PT Re-Evaluation 06/14/22    Authorization Type MCR    Authorization Time Period FOTO by 15th, KX by 15th    Progress Note Due on Visit 20    PT Start Time 1405    PT Stop Time 1445    PT Time Calculation (min) 40 min    Activity Tolerance Patient tolerated treatment well    Behavior During Therapy [facility] for tasks assessed/performed                     Past Medical History:  Diagnosis Date   ADD (attention deficit disorder)    Anxiety 2007   Asthma as child   Asymptomatic gallstones    Atrial fibrillation (HCC)    Atrial flutter (HCC)    Depression    DJD (degenerative joint disease) of knee    Dysrhythmia    Chronic Atrial Fib- seeing Dr. Lenna Sciara [facility]   H/O peptic ulcer    past history with GI bleed- cauterization done throught scope   History of kidney stones    pt claims urology discovered a kidney stone last week 05/25/21   Hypercholesterolemia    Orthostatic hypotension    Pneumonia as child   Pneumothorax on left 05/20/2009   Prediabetes    none now   Pulmonary nodules    Sleep apnea    cpap set on 13   Thrombocytopenia ([place])    pt denies   Thyromegaly    Varicose veins    Past Surgical History:  Procedure Laterality Date   BIOPSY  04/18/2018   Procedure: BIOPSY;  Surgeon: Ronnette Juniper, MD;  Location: [facility];  Service: Gastroenterology;;   ESOPHAGOGASTRODUODENOSCOPY N/A 04/18/2018   Procedure: ESOPHAGOGASTRODUODENOSCOPY (EGD);  Surgeon: Ronnette Juniper, MD;  Location: [facility];  Service: Gastroenterology;  Laterality: N/A;   ESOPHAGOGASTRODUODENOSCOPY (EGD) WITH PROPOFOL N/A 01/05/2015   Procedure: ESOPHAGOGASTRODUODENOSCOPY (EGD) WITH PROPOFOL;  Surgeon: Garlan Fair, MD;  Location: [facility];  Service: Endoscopy;  Laterality: N/A;   EXTRACORPOREAL SHOCK WAVE LITHOTRIPSY Left 08/11/2021   Procedure: LEFT EXTRACORPOREAL SHOCK WAVE LITHOTRIPSY (ESWL);  Surgeon: Janith Lima, MD;  Location: [facility];  Service: Urology;  Laterality: Left;   GASTRIC ROUX-EN-Y     '08-[name] (weight stable around 302 #   KNEE ARTHROSCOPY     TONSILLECTOMY     TOTAL KNEE ARTHROPLASTY Left 06/03/2021   Procedure: LEFT TOTAL KNEE ARTHROPLASTY;  Surgeon: Newt Minion, MD;  Location: [place];  Service: Orthopedics;  Laterality: Left;   ULNAR NERVE TRANSPOSITION Right 15 yrs ago   Patient Active Problem List   Diagnosis Date Noted   Total knee replacement status, 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   Unilateral primary osteoarthritis, left knee    Longstanding persistent atrial fibrillation ([place]) 04/26/2021   Preop cardiovascular exam 04/26/2021   Orthostatic hypotension 02/20/2019   Chronic anticoagulation 03/05/2015   Abnormal chest x-ray with multiple lung nodules 12/14/2014   AS (aortic stenosis) 12/14/2014   Atrial  flutter by electrocardiogram ([place]) 12/14/2014   CAD (coronary artery disease) 12/14/2014   DJD (degenerative joint disease) 12/14/2014   History of GI bleed 12/14/2014   Nephrolithiasis 12/14/2014   Other activity(E029.9) 12/14/2014   Prediabetes 12/14/2014   Thrombocytopenia ([place]) 12/14/2014   Arterial blood pressure decreased 10/28/2014   Atrial fibrillation ([place]) 09/02/2014   Morbid obesity ([place]) 09/02/2014   AF (paroxysmal atrial fibrillation) ([place]) 08/03/2014   Gastroduodenal ulcer 08/03/2014   Apnea, sleep 08/03/2014   Glaucoma suspect 02/21/2013   Glaucoma suspect of both eyes 02/21/2013   Herpes 04/11/2012   Routine general medical  examination at a health care facility 04/11/2012   DYSLIPIDEMIA 05/31/2009   DEPRESSION/ANXIETY 05/31/2009   ATTENTION DEFICIT DISORDER 05/31/2009   Obstructive sleep apnea 05/31/2009   GERD 05/31/2009   GOUT, HX OF 05/31/2009    REFERRING DIAG: Unspecified fall, Other abnormalities of gait and mobility  THERAPY DIAG:  Other abnormalities of gait and mobility  Muscle weakness (generalized)  History of falling  Rationale for Evaluation and Treatment Rehabilitation  PERTINENT HISTORY: Left TKA 06/03/2021, ADD, OCD, anxiety, depression  PRECAUTIONS: Fall   SUBJECTIVE: Patient reports his knees are feeling stiff this visit.   PAIN:  Are you having pain? No  PATIENT GOALS: Improve balance and falls   OBJECTIVE: (objective measures completed at initial evaluation unless otherwise dated) PATIENT SURVEYS:  FOTO 49% functional status  04/12/2022: 52%  05/10/2022: 52%   SENSATION: Patient reports numbness due to neuropathy   POSTURE:            Rounded shoulder posture   LOWER EXTREMITY MMT:   MMT Right eval Left eval Rt / Lt 03/09/2022 Rt / Lt 04/19/2022  Hip flexion 4 4    Hip extension 4- 4-    Hip abduction 4- 4- 4- / 4-   Knee flexion 5 5    Knee extension 5 5    Ankle dorsiflexion 5 5    Ankle plantarflexion 4 4      FUNCTIONAL TESTS:  5 times sit to stand: 14 seconds  03/22/2022: 12 seconds 6 minute walk test: 1235 ft  04/19/2022: 1,460 ft Berg Balance Scale: 52/56  04/19/2022: 54/56  GAIT: Distance walked: 1235 Assistive device utilized: None Level of assistance: Complete Independence Comments: patient with left trunk lean, trendelenburg on leg, slightly antalgic on right, patient with occasional unsteady gait with one instance of poor foot clearance on left with stumble     TODAY'S TREATMENT: [facility] Adult PT Treatment:                                                DATE: 05/10/2022 Therapeutic Exercise: NuStep L7 x 5 min with LE/UE  while taking  subjective SLR 2 x 10 Bridge x 10 Figure-4 bridge x 10 each Sidelying hip abduction 2 x 15 each Knee extension machine 35# 3 x 10 Leg press (cybex) 120# 2 x 10 Neuro Reed: Romberg on Airex 2 x 60 seconds   [facility] Adult PT Treatment:                                                DATE: 05/03/2022 Therapeutic Exercise: NuStep L7 x 5 min with LE/UE  while  taking subjective Knee extension machine 35# 3 x 10 Leg press (cybex) 100# x 10, 120# 2 x 10 Sidelying hip abduction 2 x 15 each Figure-4 bridge 2 x 10 each  [facility] Adult PT Treatment:                                                DATE: 04/26/2022 Therapeutic Exercise: NuStep L7 x 5 min with LE/UE  while taking subjective Bridge 2 x 10 SLR 2 x 10 each Sidelying hip abduction 2 x 15 each Heel-toe raises 2 x 20 Leg press (cybex) 100# 3 x 10 Neuro Reed: Romberg on Airex 2 x 60 seconds Tandem stance 2 x 30 sec each Rockerboard in frontal and sagittal plane x 60 sec each   PATIENT EDUCATION:  Education details: HEP Person educated: Patient Education method: Consulting civil engineer, Media planner, Corporate treasurer cues, Verbal cues Education comprehension: verbalized understanding, returned demonstration, verbal cues required, tactile cues required, and needs further education   HOME EXERCISE PROGRAM: Access Code: [email or web address]      ASSESSMENT: CLINICAL IMPRESSION: Patient tolerated therapy well with no adverse effects. Therapy continues to focus on strengthening and balance training with good tolerance. He does continue to require occasional UE support with balance training but is progressing well with strengthening. No changes made to HEP this visit. Patient will be having cataract surgery on 05/16/2022 so will place PT on hold until he is cleared by the surgeon to resume PT. Patient would benefit from continued skilled PT to progress his strength and balance in order to improve walking and reduce fall risk.    OBJECTIVE IMPAIRMENTS Abnormal gait,  decreased activity tolerance, decreased balance, decreased strength, impaired sensation, and pain.    ACTIVITY LIMITATIONS stairs and locomotion level   PARTICIPATION LIMITATIONS: shopping and community activity   PERSONAL FACTORS Behavior pattern, Past/current experiences, Time since onset of injury/illness/exacerbation, and 3+ comorbidities: see above  are also affecting patient's functional outcome.      GOALS: Goals reviewed with patient? Yes   SHORT TERM GOALS: Target date: 03/22/2022    Patient will be I with initial HEP in order to progress with therapy. Baseline: HEP provided at eval 03/22/2022: independent Goal status: MET   2.  PT will review FOTO with patient by 3rd visit in order to understand expected progress and outcome with therapy. Baseline: FOTO assessed at eval 03/22/2022: reviewed Goal status: MET   3.  Patient will demonstrate 5xSTS </= 12 sec in order to indicate improved LE strength and reduced fall risk Baseline: 14 sec 03/22/2022: 12 sec Goal status: MET   LONG TERM GOALS: Target date: 06/14/2022   Patient will be I with final HEP to maintain progress from PT. Baseline: HEP provided at eval 04/19/2022: independent Goal status: ONGOING   2.  Patient will report >/= 63% status on FOTO to indicate improved functional ability. Baseline: 49% functional status 04/12/2022: 52% 05/10/2022: 52% Goal status: PARTIALLY MET   3.  Patient will perform 6MWT >/= 1635 ft in order to improve community access Baseline: 1235 ft 04/19/2022: 1,460 ft Goal status: PARTIALLY MET   4.  Patient will demonstrate BERG >/= 55/56 in order to indicate improved balance and reduced fall risk Baseline: 52/56 04/19/2022: 54/56 Goal status: PARTIALLY MET     PLAN: PT FREQUENCY: 1x/week   PT DURATION: 8 weeks   PLANNED INTERVENTIONS: Therapeutic exercises,  Therapeutic activity, Neuromuscular re-education, Balance training, Gait training, Patient/Family education, Self Care,  Joint mobilization, Stair training, Aquatic Therapy, Dry Needling, Cryotherapy, Moist heat, Manual therapy, and Re-evaluation   PLAN FOR NEXT SESSION: Review HEP and progress PRN, initiate hip and LE strengthening progressing to closed chair exercises such as step-ups, balance training progressing to dynamic    Hilda Blades, PT, DPT, LAT, ATC 05/10/22  2:51 PM Phone: [phone] Fax: [phone]

## 2022-05-10 ENCOUNTER — Ambulatory Visit: Payer: Medicare Other | Admitting: Physical Therapy

## 2022-05-10 ENCOUNTER — Encounter: Payer: Self-pay | Admitting: Vascular Surgery

## 2022-05-10 ENCOUNTER — Ambulatory Visit (INDEPENDENT_AMBULATORY_CARE_PROVIDER_SITE_OTHER): Payer: Medicare Other | Admitting: Vascular Surgery

## 2022-05-10 ENCOUNTER — Encounter: Payer: Self-pay | Admitting: Physical Therapy

## 2022-05-10 ENCOUNTER — Other Ambulatory Visit: Payer: Self-pay

## 2022-05-10 VITALS — BP 115/78 | HR 90 | Temp 98.6°F | Resp 20 | Ht 75.0 in | Wt 222.0 lb

## 2022-05-10 DIAGNOSIS — Z9181 History of falling: Secondary | ICD-10-CM

## 2022-05-10 DIAGNOSIS — I83813 Varicose veins of bilateral lower extremities with pain: Secondary | ICD-10-CM

## 2022-05-10 DIAGNOSIS — I872 Venous insufficiency (chronic) (peripheral): Secondary | ICD-10-CM

## 2022-05-10 DIAGNOSIS — M6281 Muscle weakness (generalized): Secondary | ICD-10-CM

## 2022-05-10 DIAGNOSIS — R2689 Other abnormalities of gait and mobility: Secondary | ICD-10-CM | POA: Diagnosis not present

## 2022-05-10 NOTE — Progress Notes (Signed)
Patient ID: Francisco Padilla, male   DOB: 1950/12/23, 70 y.o.   MRN: [id number]  Reason for Consult: No chief complaint on file.   Referred by Lajean Manes, MD  Subjective:     HPI:  Francisco Padilla is a 71 y.o. male with a previous history of left greater saphenous vein ablation and I have seen him with known recanalization of this.  He has been very consistent with thigh-high compression stockings.  More recently underwent total knee arthroplasty on the left and has recovered well from this.  At last visit he was noted to have mild swelling and notable varicosities on the medial leg but was also minimally symptomatic at that time.  Really not having much in the way of symptoms at this time.  Does have worsened varicosities on the left with edema below the level of the knee replacement but without any bleeding or burning at this time.  Past Medical History:  Diagnosis Date   ADD (attention deficit disorder)    Anxiety 2007   Asthma as child   Asymptomatic gallstones    Atrial fibrillation (HCC)    Atrial flutter (HCC)    Depression    DJD (degenerative joint disease) of knee    Dysrhythmia    Chronic Atrial Fib- seeing Dr. Lenna Sciara [facility]   H/O peptic ulcer    past history with GI bleed- cauterization done throught scope   History of kidney stones    pt claims urology discovered a kidney stone last week 05/25/21   Hypercholesterolemia    Orthostatic hypotension    Pneumonia as child   Pneumothorax on left 05/20/2009   Prediabetes    none now   Pulmonary nodules    Sleep apnea    cpap set on 13   Thrombocytopenia (HCC)    pt denies   Thyromegaly    Varicose veins    Family History  Problem Relation Age of Onset   Heart disease Mother    Heart failure Mother    Heart disease Father    Heart attack Father    Stroke Neg Hx    Past Surgical History:  Procedure Laterality Date   BIOPSY  04/18/2018   Procedure: BIOPSY;  Surgeon: Ronnette Juniper, MD;  [facility];  Service: Gastroenterology;;   ESOPHAGOGASTRODUODENOSCOPY N/A 04/18/2018   Procedure: ESOPHAGOGASTRODUODENOSCOPY (EGD);  Surgeon: Ronnette Juniper, MD;  [facility];  Service: Gastroenterology;  Laterality: N/A;   ESOPHAGOGASTRODUODENOSCOPY (EGD) WITH PROPOFOL N/A 01/05/2015   Procedure: ESOPHAGOGASTRODUODENOSCOPY (EGD) WITH PROPOFOL;  Surgeon: Garlan Fair, MD;  [facility];  Service: Endoscopy;  Laterality: N/A;   EXTRACORPOREAL SHOCK WAVE LITHOTRIPSY Left 08/11/2021   Procedure: LEFT EXTRACORPOREAL SHOCK WAVE LITHOTRIPSY (ESWL);  Surgeon: Janith Lima, MD;  Location: [facility];  Service: Urology;  Laterality: Left;   GASTRIC ROUX-EN-Y     '08-[name] (weight stable around 302 #   KNEE ARTHROSCOPY     TONSILLECTOMY     TOTAL KNEE ARTHROPLASTY Left 06/03/2021   Procedure: LEFT TOTAL KNEE ARTHROPLASTY;  Surgeon: Newt Minion, MD;  Location: [place];  Service: Orthopedics;  Laterality: Left;   ULNAR NERVE TRANSPOSITION Right 15 yrs ago    Short Social History:  Social History   Tobacco Use   Smoking status: Never   Smokeless tobacco: Never  Substance Use Topics   Alcohol use: No    Alcohol/week: 0.0 standard drinks of alcohol    Allergies  Allergen Reactions  Aspirin     Avoid due to bariatric surgery   Nsaids     Avoid due to bariatric surgery   Oxycodone Hcl     Hallucinations, agitation     Current Outpatient Medications  Medication Sig Dispense Refill   acetaminophen (TYLENOL) 650 MG CR tablet Take 1,300 mg by mouth every 8 (eight) hours as needed for pain.     atorvastatin (LIPITOR) 20 MG tablet Take 20 mg by mouth at bedtime.     Calcium Citrate-Vitamin D (CALCIUM CITRATE + D PO) Take 1 tablet by mouth 2 (two) times daily.     Cyanocobalamin (B-12 SL) Place 1 tablet under the tongue daily.     diphenhydrAMINE (BENADRYL) 25 MG tablet Take 12.5-25 mg by mouth at bedtime.     ELIQUIS 5 MG TABS tablet Take 5 mg by mouth 2  (two) times daily.   0   fludrocortisone (FLORINEF) 0.1 MG tablet Take 0.1 mg by mouth daily.     gabapentin (NEURONTIN) 100 MG capsule Take 4 capsules (400 mg total) by mouth at bedtime. 120 capsule 2   latanoprost (XALATAN) 0.005 % ophthalmic solution Place 1 drop into both eyes at bedtime.     LORazepam (ATIVAN) 1 MG tablet Take 0.5 mg by mouth every 4 (four) hours.     Multiple Vitamins-Minerals (ONE DAILY MULTIVITAMIN MEN) TABS Take 1 tablet by mouth 2 (two) times daily.     pantoprazole (PROTONIX) 40 MG tablet Take 80 mg by mouth at bedtime.      Polyethyl Glycol-Propyl Glycol (SYSTANE OP) Place 1 drop into both eyes daily as needed (dry eyes).     Probiotic Product (PROBIOTIC PO) Take 1 capsule by mouth daily.     sertraline (ZOLOFT) 100 MG tablet Take 100 mg by mouth at bedtime.     SUPER B COMPLEX/C PO Take 1 tablet by mouth daily.     zolpidem (AMBIEN) 10 MG tablet Take 5-10 mg by mouth at bedtime.  0   No current facility-administered medications for this visit.    Review of Systems  Constitutional:  Constitutional negative. HENT: HENT negative.  Eyes: Eyes negative.  Cardiovascular: Positive for leg swelling.  GI: Gastrointestinal negative.  Musculoskeletal: Musculoskeletal negative.  Skin: Skin negative.  Neurological: Neurological negative. Hematologic: Hematologic/lymphatic negative.  Psychiatric: Psychiatric negative.        Objective:  Objective   Vitals:   05/10/22 1607  BP: 115/78  Pulse: 90  Resp: 20  Temp: 98.6 F (37 C)  SpO2: 97%   Physical Exam HENT:     Head: Normocephalic.     Nose: Nose normal.  Eyes:     Pupils: Pupils are equal, round, and reactive to light.  Cardiovascular:     Rate and Rhythm: Normal rate.     Pulses: Normal pulses.  Pulmonary:     Effort: Pulmonary effort is normal.  Abdominal:     General: Abdomen is flat.     Palpations: Abdomen is soft.  Musculoskeletal:     Cervical back: Normal range of motion and neck  supple.     Right lower leg: No edema.     Left lower leg: Edema present.     Comments: Bilateral left greater than right lower extremity varicosities  Skin:    General: Skin is warm and dry.     Capillary Refill: Capillary refill takes less than 2 seconds.  Neurological:     General: No focal deficit present.     Mental  Status: He is alert.  Psychiatric:        Mood and Affect: Mood normal.     Data: No new studies     Assessment/Plan:    71 year old male with previous treatment of great saphenous veins bilaterally with recanalization of the left greater saphenous vein and bilateral varicosities.  At this time he remains minimally symptomatic.  We fitted him for new thigh-high compression stockings lower EXTR long today.  Plan will be to follow-up in 6 months with repeat lower extremity venous duplex.  Certainly if he is not having symptoms at that time we can discuss longer interval follow-up.     Waynetta Sandy MD [facility]

## 2022-05-13 DIAGNOSIS — H04123 Dry eye syndrome of bilateral lacrimal glands: Secondary | ICD-10-CM | POA: Diagnosis not present

## 2022-05-18 ENCOUNTER — Encounter: Payer: Self-pay | Admitting: Physical Therapy

## 2022-05-18 ENCOUNTER — Ambulatory Visit: Payer: Medicare Other | Admitting: Physical Therapy

## 2022-05-18 ENCOUNTER — Other Ambulatory Visit: Payer: Self-pay

## 2022-05-18 DIAGNOSIS — R2689 Other abnormalities of gait and mobility: Secondary | ICD-10-CM | POA: Diagnosis not present

## 2022-05-18 DIAGNOSIS — M6281 Muscle weakness (generalized): Secondary | ICD-10-CM | POA: Diagnosis not present

## 2022-05-18 DIAGNOSIS — Z9181 History of falling: Secondary | ICD-10-CM

## 2022-05-18 NOTE — Therapy (Signed)
OUTPATIENT PHYSICAL THERAPY TREATMENT NOTE    Patient Name: Francisco Padilla MRN: [id number] DOB:1950-11-28, 71 y.o., male Today's Date: 05/18/2022  PCP: Lajean Manes, MD   REFERRING PROVIDER: Kathalene Frames, MD   END OF SESSION:   PT End of Session - 05/18/22 1617     Visit Number 11    Number of Visits 15    Date for PT Re-Evaluation 06/14/22    Authorization Type MCR    Authorization Time Period FOTO by 15th, KX by 15th    Progress Note Due on Visit 20    PT Start Time 1615    PT Stop Time 1655    PT Time Calculation (min) 40 min    Activity Tolerance Patient tolerated treatment well    Behavior During Therapy [facility] for tasks assessed/performed                      Past Medical History:  Diagnosis Date   ADD (attention deficit disorder)    Anxiety 2007   Asthma as child   Asymptomatic gallstones    Atrial fibrillation (HCC)    Atrial flutter (HCC)    Depression    DJD (degenerative joint disease) of knee    Dysrhythmia    Chronic Atrial Fib- seeing Dr. Lenna Sciara [facility]   H/O peptic ulcer    past history with GI bleed- cauterization done throught scope   History of kidney stones    pt claims urology discovered a kidney stone last week 05/25/21   Hypercholesterolemia    Orthostatic hypotension    Pneumonia as child   Pneumothorax on left 05/20/2009   Prediabetes    none now   Pulmonary nodules    Sleep apnea    cpap set on 13   Thrombocytopenia ([place])    pt denies   Thyromegaly    Varicose veins    Past Surgical History:  Procedure Laterality Date   BIOPSY  04/18/2018   Procedure: BIOPSY;  Surgeon: Ronnette Juniper, MD;  Location: [facility];  Service: Gastroenterology;;   ESOPHAGOGASTRODUODENOSCOPY N/A 04/18/2018   Procedure: ESOPHAGOGASTRODUODENOSCOPY (EGD);  Surgeon: Ronnette Juniper, MD;  Location: [facility];  Service: Gastroenterology;  Laterality: N/A;   ESOPHAGOGASTRODUODENOSCOPY (EGD) WITH PROPOFOL N/A 01/05/2015   Procedure:  ESOPHAGOGASTRODUODENOSCOPY (EGD) WITH PROPOFOL;  Surgeon: Garlan Fair, MD;  Location: [facility];  Service: Endoscopy;  Laterality: N/A;   EXTRACORPOREAL SHOCK WAVE LITHOTRIPSY Left 08/11/2021   Procedure: LEFT EXTRACORPOREAL SHOCK WAVE LITHOTRIPSY (ESWL);  Surgeon: Janith Lima, MD;  Location[facility];  Service: Urology;  Laterality: Left;   GASTRIC ROUX-EN-Y     '08-[name] (weight stable around 302 #   KNEE ARTHROSCOPY     TONSILLECTOMY     TOTAL KNEE ARTHROPLASTY Left 06/03/2021   Procedure: LEFT TOTAL KNEE ARTHROPLASTY;  Surgeon: Newt Minion, MD;  Location: [place];  Service: Orthopedics;  Laterality: Left;   ULNAR NERVE TRANSPOSITION Right 15 yrs ago   Patient Active Problem List   Diagnosis Date Noted   Total knee replacement status, 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   Unilateral primary osteoarthritis, left knee    Longstanding persistent atrial fibrillation ([place]) 04/26/2021   Preop cardiovascular exam 04/26/2021   Orthostatic hypotension 02/20/2019   Chronic anticoagulation 03/05/2015   Abnormal chest x-ray with multiple lung nodules 12/14/2014   AS (aortic stenosis) 12/14/2014   Atrial flutter by electrocardiogram ([place]) 12/14/2014   CAD (coronary artery disease) 12/14/2014   DJD (degenerative joint disease) 12/14/2014  History of GI bleed 12/14/2014   Nephrolithiasis 12/14/2014   Other activity(E029.9) 12/14/2014   Prediabetes 12/14/2014   Thrombocytopenia ([place]) 12/14/2014   Arterial blood pressure decreased 10/28/2014   Atrial fibrillation ([place]) 09/02/2014   Morbid obesity ([place]) 09/02/2014   AF (paroxysmal atrial fibrillation) ([place]) 08/03/2014   Gastroduodenal ulcer 08/03/2014   Apnea, sleep 08/03/2014   Glaucoma suspect 02/21/2013   Glaucoma suspect of both eyes 02/21/2013   Herpes 04/11/2012   Routine general medical examination at a health care facility 04/11/2012   DYSLIPIDEMIA 05/31/2009   DEPRESSION/ANXIETY 05/31/2009   ATTENTION DEFICIT DISORDER  05/31/2009   Obstructive sleep apnea 05/31/2009   GERD 05/31/2009   GOUT, HX OF 05/31/2009    REFERRING DIAG: Unspecified fall, Other abnormalities of gait and mobility  THERAPY DIAG:  Other abnormalities of gait and mobility  Muscle weakness (generalized)  History of falling  Rationale for Evaluation and Treatment Rehabilitation  PERTINENT HISTORY: Left TKA 06/03/2021, ADD, OCD, anxiety, depression  PRECAUTIONS: Fall   SUBJECTIVE: Patient reports his eye surgery was postponed so will continue therapy. He continues to go to the [employer] to ride the bike and swim.   PAIN:  Are you having pain? No  PATIENT GOALS: Improve balance and falls   OBJECTIVE: (objective measures completed at initial evaluation unless otherwise dated) PATIENT SURVEYS:  FOTO 49% functional status  04/12/2022: 52%  05/10/2022: 52%   SENSATION: Patient reports numbness due to neuropathy   POSTURE:            Rounded shoulder posture   LOWER EXTREMITY MMT:   MMT Right eval Left eval Rt / Lt 03/09/2022 Rt / Lt 05/18/2022  Hip flexion 4 4    Hip extension 4- 4-    Hip abduction 4- 4- 4- / 4- 4- / 4-  Knee flexion 5 5    Knee extension 5 5    Ankle dorsiflexion 5 5    Ankle plantarflexion 4 4      FUNCTIONAL TESTS:  5 times sit to stand: 14 seconds  03/22/2022: 12 seconds 6 minute walk test: 1235 ft  04/19/2022: 1,460 ft Berg Balance Scale: 52/56  04/19/2022: 54/56  GAIT: Distance walked: 1235 Assistive device utilized: None Level of assistance: Complete Independence Comments: patient with left trunk lean, trendelenburg on leg, slightly antalgic on right, patient with occasional unsteady gait with one instance of poor foot clearance on left with stumble     TODAY'S TREATMENT: [facility] Adult PT Treatment:                                                DATE: 05/18/2022 Therapeutic Exercise: NuStep L7 x 5 min with LE/UE  while taking subjective Leg press (cybex) 120# 3 x 10 Knee extension  machine 35# 3 x 10 Backward and side stepping with FM 10# x 5 each Forward 10" step-up 2 x 10 each Neuro Reed: Rockerboard fwd/bwd taps 2 x 1 min 3/4 tandem 2 x 30 sec each   [facility] Adult PT Treatment:                                                DATE: 05/10/2022 Therapeutic Exercise: NuStep L7 x 5 min with LE/UE  while taking subjective SLR 2 x 10 Bridge x 10 Figure-4 bridge x 10 each Sidelying hip abduction 2 x 15 each Knee extension machine 35# 3 x 10 Leg press (cybex) 120# 2 x 10 Neuro Reed: Romberg on Airex 2 x 60 seconds  [facility] Adult PT Treatment:                                                DATE: 05/03/2022 Therapeutic Exercise: NuStep L7 x 5 min with LE/UE  while taking subjective Knee extension machine 35# 3 x 10 Leg press (cybex) 100# x 10, 120# 2 x 10 Sidelying hip abduction 2 x 15 each Figure-4 bridge 2 x 10 each   PATIENT EDUCATION:  Education details: HEP Person educated: Patient Education method: Consulting civil engineer, Media planner, Corporate treasurer cues, Verbal cues Education comprehension: verbalized understanding, returned demonstration, verbal cues required, tactile cues required, and needs further education   HOME EXERCISE PROGRAM: Access Code: [email or web address]      ASSESSMENT: CLINICAL IMPRESSION: Patient tolerated therapy well with no adverse effects. Therapy continues to focus on progressing LE strength and balance training with good tolerance. Incorporated resisted stepping with good tolerance and patient did not require any UE support with balance training. He does continue to exhibit gross hip weakness. No changes to HEP this visit. Patient would benefit from continued skilled PT to progress his strength and balance in order to improve walking and reduce fall risk.    OBJECTIVE IMPAIRMENTS Abnormal gait, decreased activity tolerance, decreased balance, decreased strength, impaired sensation, and pain.    ACTIVITY LIMITATIONS stairs and locomotion level   PARTICIPATION  LIMITATIONS: shopping and community activity   PERSONAL FACTORS Behavior pattern, Past/current experiences, Time since onset of injury/illness/exacerbation, and 3+ comorbidities: see above  are also affecting patient's functional outcome.      GOALS: Goals reviewed with patient? Yes   SHORT TERM GOALS: Target date: 03/22/2022    Patient will be I with initial HEP in order to progress with therapy. Baseline: HEP provided at eval 03/22/2022: independent Goal status: MET   2.  PT will review FOTO with patient by 3rd visit in order to understand expected progress and outcome with therapy. Baseline: FOTO assessed at eval 03/22/2022: reviewed Goal status: MET   3.  Patient will demonstrate 5xSTS </= 12 sec in order to indicate improved LE strength and reduced fall risk Baseline: 14 sec 03/22/2022: 12 sec Goal status: MET   LONG TERM GOALS: Target date: 06/14/2022   Patient will be I with final HEP to maintain progress from PT. Baseline: HEP provided at eval 04/19/2022: independent Goal status: ONGOING   2.  Patient will report >/= 63% status on FOTO to indicate improved functional ability. Baseline: 49% functional status 04/12/2022: 52% 05/10/2022: 52% Goal status: PARTIALLY MET   3.  Patient will perform 6MWT >/= 1635 ft in order to improve community access Baseline: 1235 ft 04/19/2022: 1,460 ft Goal status: PARTIALLY MET   4.  Patient will demonstrate BERG >/= 55/56 in order to indicate improved balance and reduced fall risk Baseline: 52/56 04/19/2022: 54/56 Goal status: PARTIALLY MET     PLAN: PT FREQUENCY: 1x/week   PT DURATION: 8 weeks   PLANNED INTERVENTIONS: Therapeutic exercises, Therapeutic activity, Neuromuscular re-education, Balance training, Gait training, Patient/Family education, Self Care, Joint mobilization, Stair training, Aquatic Therapy, Dry Needling, Cryotherapy, Moist heat, Manual therapy, and  Re-evaluation   PLAN FOR NEXT SESSION: Review HEP and  progress PRN, initiate hip and LE strengthening progressing to closed chair exercises such as step-ups, balance training progressing to dynamic    Hilda Blades, PT, DPT, LAT, ATC 05/18/22  4:58 PM Phone: [phone] Fax: [phone]

## 2022-05-22 DIAGNOSIS — L821 Other seborrheic keratosis: Secondary | ICD-10-CM | POA: Diagnosis not present

## 2022-05-22 DIAGNOSIS — E663 Overweight: Secondary | ICD-10-CM | POA: Diagnosis not present

## 2022-05-22 DIAGNOSIS — D2272 Melanocytic nevi of left lower limb, including hip: Secondary | ICD-10-CM | POA: Diagnosis not present

## 2022-05-22 DIAGNOSIS — D1801 Hemangioma of skin and subcutaneous tissue: Secondary | ICD-10-CM | POA: Diagnosis not present

## 2022-05-22 DIAGNOSIS — L814 Other melanin hyperpigmentation: Secondary | ICD-10-CM | POA: Diagnosis not present

## 2022-05-22 DIAGNOSIS — L570 Actinic keratosis: Secondary | ICD-10-CM | POA: Diagnosis not present

## 2022-05-22 DIAGNOSIS — D225 Melanocytic nevi of trunk: Secondary | ICD-10-CM | POA: Diagnosis not present

## 2022-05-22 DIAGNOSIS — X32XXXS Exposure to sunlight, sequela: Secondary | ICD-10-CM | POA: Diagnosis not present

## 2022-05-22 DIAGNOSIS — D2271 Melanocytic nevi of right lower limb, including hip: Secondary | ICD-10-CM | POA: Diagnosis not present

## 2022-05-23 NOTE — Therapy (Signed)
OUTPATIENT PHYSICAL THERAPY TREATMENT NOTE    Patient Name: Francisco Padilla MRN: [id number] DOB:May 14, 1951, 71 y.o., male Today's Date: 05/24/2022  PCP: Lajean Manes, MD   REFERRING PROVIDER: Kathalene Frames, MD   END OF SESSION:   PT End of Session - 05/24/22 1426     Visit Number 12    Number of Visits 15    Date for PT Re-Evaluation 06/14/22    Authorization Type MCR    Authorization Time Period FOTO by 15th, KX by 15th    Progress Note Due on Visit 20    PT Start Time 1420   patient arrived late   PT Stop Time 1458    PT Time Calculation (min) 38 min    Activity Tolerance Patient tolerated treatment well    Behavior During Therapy [facility] for tasks assessed/performed                       Past Medical History:  Diagnosis Date   ADD (attention deficit disorder)    Anxiety 2007   Asthma as child   Asymptomatic gallstones    Atrial fibrillation ([place])    Atrial flutter ([place])    Depression    DJD (degenerative joint disease) of knee    Dysrhythmia    Chronic Atrial Fib- seeing Dr. Lenna Sciara [facility]   H/O peptic ulcer    past history with GI bleed- cauterization done throught scope   History of kidney stones    pt claims urology discovered a kidney stone last week 05/25/21   Hypercholesterolemia    Orthostatic hypotension    Pneumonia as child   Pneumothorax on left 05/20/2009   Prediabetes    none now   Pulmonary nodules    Sleep apnea    cpap set on 13   Thrombocytopenia ([place])    pt denies   Thyromegaly    Varicose veins    Past Surgical History:  Procedure Laterality Date   BIOPSY  04/18/2018   Procedure: BIOPSY;  Surgeon: Ronnette Juniper, MD;  Location: [facility];  Service: Gastroenterology;;   ESOPHAGOGASTRODUODENOSCOPY N/A 04/18/2018   Procedure: ESOPHAGOGASTRODUODENOSCOPY (EGD);  Surgeon: Ronnette Juniper, MD;  Location: [facility];  Service: Gastroenterology;  Laterality: N/A;   ESOPHAGOGASTRODUODENOSCOPY (EGD) WITH PROPOFOL N/A  01/05/2015   Procedure: ESOPHAGOGASTRODUODENOSCOPY (EGD) WITH PROPOFOL;  Surgeon: Garlan Fair, MD;  Location: [facility];  Service: Endoscopy;  Laterality: N/A;   EXTRACORPOREAL SHOCK WAVE LITHOTRIPSY Left 08/11/2021   Procedure: LEFT EXTRACORPOREAL SHOCK WAVE LITHOTRIPSY (ESWL);  Surgeon: Janith Lima, MD;  Location: [facility];  Service: Urology;  Laterality: Left;   GASTRIC ROUX-EN-Y     '08-[name] (weight stable around 302 #   KNEE ARTHROSCOPY     TONSILLECTOMY     TOTAL KNEE ARTHROPLASTY Left 06/03/2021   Procedure: LEFT TOTAL KNEE ARTHROPLASTY;  Surgeon: Newt Minion, MD;  Location: [place];  Service: Orthopedics;  Laterality: Left;   ULNAR NERVE TRANSPOSITION Right 15 yrs ago   Patient Active Problem List   Diagnosis Date Noted   Total knee replacement status, 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   Unilateral primary osteoarthritis, left knee    Longstanding persistent atrial fibrillation ([place]) 04/26/2021   Preop cardiovascular exam 04/26/2021   Orthostatic hypotension 02/20/2019   Chronic anticoagulation 03/05/2015   Abnormal chest x-ray with multiple lung nodules 12/14/2014   AS (aortic stenosis) 12/14/2014   Atrial flutter by electrocardiogram ([place]) 12/14/2014   CAD (coronary artery disease) 12/14/2014  DJD (degenerative joint disease) 12/14/2014   History of GI bleed 12/14/2014   Nephrolithiasis 12/14/2014   Other activity(E029.9) 12/14/2014   Prediabetes 12/14/2014   Thrombocytopenia ([place]) 12/14/2014   Arterial blood pressure decreased 10/28/2014   Atrial fibrillation ([place]) 09/02/2014   Morbid obesity ([place]) 09/02/2014   AF (paroxysmal atrial fibrillation) ([place]) 08/03/2014   Gastroduodenal ulcer 08/03/2014   Apnea, sleep 08/03/2014   Glaucoma suspect 02/21/2013   Glaucoma suspect of both eyes 02/21/2013   Herpes 04/11/2012   Routine general medical examination at a health care facility 04/11/2012   DYSLIPIDEMIA 05/31/2009   DEPRESSION/ANXIETY 05/31/2009    ATTENTION DEFICIT DISORDER 05/31/2009   Obstructive sleep apnea 05/31/2009   GERD 05/31/2009   GOUT, HX OF 05/31/2009    REFERRING DIAG: Unspecified fall, Other abnormalities of gait and mobility  THERAPY DIAG:  Other abnormalities of gait and mobility  Muscle weakness (generalized)  History of falling  Rationale for Evaluation and Treatment Rehabilitation  PERTINENT HISTORY: Left TKA 06/03/2021, ADD, OCD, anxiety, depression  PRECAUTIONS: Fall   SUBJECTIVE: Patient reports he is doing well. He had al his eye surgeries canceled and will follow-up with his surgeon to reschedule them. He feels he continues to get benefit from therapy.  PAIN:  Are you having pain? No  PATIENT GOALS: Improve balance and falls   OBJECTIVE: (objective measures completed at initial evaluation unless otherwise dated) PATIENT SURVEYS:  FOTO 49% functional status  04/12/2022: 52%  05/10/2022: 52%   SENSATION: Patient reports numbness due to neuropathy   POSTURE:            Rounded shoulder posture   LOWER EXTREMITY MMT:   MMT Right eval Left eval Rt / Lt 03/09/2022 Rt / Lt 05/18/2022  Hip flexion 4 4    Hip extension 4- 4-    Hip abduction 4- 4- 4- / 4- 4- / 4-  Knee flexion 5 5    Knee extension 5 5    Ankle dorsiflexion 5 5    Ankle plantarflexion 4 4      FUNCTIONAL TESTS:  5 times sit to stand: 14 seconds  03/22/2022: 12 seconds 6 minute walk test: 1235 ft  04/19/2022: 1,460 ft Berg Balance Scale: 52/56  04/19/2022: 54/56  GAIT: Distance walked: 1235 Assistive device utilized: None Level of assistance: Complete Independence Comments: patient with left trunk lean, trendelenburg on leg, slightly antalgic on right, patient with occasional unsteady gait with one instance of poor foot clearance on left with stumble     TODAY'S TREATMENT: [facility] Adult PT Treatment:                                                DATE: 05/24/2022 Therapeutic Exercise: NuStep L7 x 5 min with LE/UE   while taking subjective Backward and side stepping with FM 13# x 5 each Leg press (cybex) 130# 3 x 10 Knee extension machine 35# 2 x 10 Knee flexion machine 45# 2 x 10 Standing heel raises x 20 Sidelying hip abduction x 20 each   [facility] Adult PT Treatment:                                                DATE: 05/18/2022 Therapeutic Exercise:  NuStep L7 x 5 min with LE/UE  while taking subjective Leg press (cybex) 120# 3 x 10 Knee extension machine 35# 3 x 10 Backward and side stepping with FM 10# x 5 each Forward 10" step-up 2 x 10 each Neuro Reed: Rockerboard fwd/bwd taps 2 x 1 min 3/4 tandem 2 x 30 sec each  [facility] Adult PT Treatment:                                                DATE: 05/10/2022 Therapeutic Exercise: NuStep L7 x 5 min with LE/UE  while taking subjective SLR 2 x 10 Bridge x 10 Figure-4 bridge x 10 each Sidelying hip abduction 2 x 15 each Knee extension machine 35# 3 x 10 Leg press (cybex) 120# 2 x 10 Neuro Reed: Romberg on Airex 2 x 60 seconds   PATIENT EDUCATION:  Education details: HEP Person educated: Patient Education method: Consulting civil engineer, Media planner, Corporate treasurer cues, Verbal cues Education comprehension: verbalized understanding, returned demonstration, verbal cues required, tactile cues required, and needs further education   HOME EXERCISE PROGRAM: Access Code: [email or web address]      ASSESSMENT: CLINICAL IMPRESSION: Patient tolerated therapy well with no adverse effects. Therapy focused primarily on progression of strengthening for LE with good tolerance. He was able to increase resistance with strengthening exercises and did not report any increased pain in therapy. He does report muscular fatigue following therapy. No changes made to HEP. Patient would benefit from continued skilled PT to progress his strength and balance in order to improve walking and reduce fall risk.    OBJECTIVE IMPAIRMENTS Abnormal gait, decreased activity tolerance, decreased balance,  decreased strength, impaired sensation, and pain.    ACTIVITY LIMITATIONS stairs and locomotion level   PARTICIPATION LIMITATIONS: shopping and community activity   PERSONAL FACTORS Behavior pattern, Past/current experiences, Time since onset of injury/illness/exacerbation, and 3+ comorbidities: see above  are also affecting patient's functional outcome.      GOALS: Goals reviewed with patient? Yes   SHORT TERM GOALS: Target date: 03/22/2022    Patient will be I with initial HEP in order to progress with therapy. Baseline: HEP provided at eval 03/22/2022: independent Goal status: MET   2.  PT will review FOTO with patient by 3rd visit in order to understand expected progress and outcome with therapy. Baseline: FOTO assessed at eval 03/22/2022: reviewed Goal status: MET   3.  Patient will demonstrate 5xSTS </= 12 sec in order to indicate improved LE strength and reduced fall risk Baseline: 14 sec 03/22/2022: 12 sec Goal status: MET   LONG TERM GOALS: Target date: 06/14/2022   Patient will be I with final HEP to maintain progress from PT. Baseline: HEP provided at eval 04/19/2022: independent Goal status: ONGOING   2.  Patient will report >/= 63% status on FOTO to indicate improved functional ability. Baseline: 49% functional status 04/12/2022: 52% 05/10/2022: 52% Goal status: PARTIALLY MET   3.  Patient will perform 6MWT >/= 1635 ft in order to improve community access Baseline: 1235 ft 04/19/2022: 1,460 ft Goal status: PARTIALLY MET   4.  Patient will demonstrate BERG >/= 55/56 in order to indicate improved balance and reduced fall risk Baseline: 52/56 04/19/2022: 54/56 Goal status: PARTIALLY MET     PLAN: PT FREQUENCY: 1x/week   PT DURATION: 8 weeks   PLANNED INTERVENTIONS: Therapeutic exercises, Therapeutic activity, Neuromuscular re-education,  Balance training, Gait training, Patient/Family education, Self Care, Joint mobilization, Stair training, Aquatic Therapy,  Dry Needling, Cryotherapy, Moist heat, Manual therapy, and Re-evaluation   PLAN FOR NEXT SESSION: Review HEP and progress PRN, initiate hip and LE strengthening progressing to closed chair exercises such as step-ups, balance training progressing to dynamic    Hilda Blades, PT, DPT, LAT, ATC 05/24/22  3:03 PM Phone: [phone] Fax: [phone]

## 2022-05-24 ENCOUNTER — Ambulatory Visit: Payer: Medicare Other | Attending: Internal Medicine | Admitting: Physical Therapy

## 2022-05-24 ENCOUNTER — Encounter: Payer: Self-pay | Admitting: Physical Therapy

## 2022-05-24 ENCOUNTER — Other Ambulatory Visit: Payer: Self-pay

## 2022-05-24 DIAGNOSIS — Z9181 History of falling: Secondary | ICD-10-CM | POA: Diagnosis not present

## 2022-05-24 DIAGNOSIS — R2689 Other abnormalities of gait and mobility: Secondary | ICD-10-CM

## 2022-05-24 DIAGNOSIS — M6281 Muscle weakness (generalized): Secondary | ICD-10-CM | POA: Diagnosis not present

## 2022-05-29 DIAGNOSIS — H04123 Dry eye syndrome of bilateral lacrimal glands: Secondary | ICD-10-CM | POA: Diagnosis not present

## 2022-05-29 DIAGNOSIS — H401111 Primary open-angle glaucoma, right eye, mild stage: Secondary | ICD-10-CM | POA: Diagnosis not present

## 2022-05-29 DIAGNOSIS — H18513 Endothelial corneal dystrophy, bilateral: Secondary | ICD-10-CM | POA: Diagnosis not present

## 2022-05-29 DIAGNOSIS — H0288A Meibomian gland dysfunction right eye, upper and lower eyelids: Secondary | ICD-10-CM | POA: Diagnosis not present

## 2022-05-29 DIAGNOSIS — H401122 Primary open-angle glaucoma, left eye, moderate stage: Secondary | ICD-10-CM | POA: Diagnosis not present

## 2022-05-29 DIAGNOSIS — H25813 Combined forms of age-related cataract, bilateral: Secondary | ICD-10-CM | POA: Diagnosis not present

## 2022-05-29 DIAGNOSIS — H353131 Nonexudative age-related macular degeneration, bilateral, early dry stage: Secondary | ICD-10-CM | POA: Diagnosis not present

## 2022-05-29 DIAGNOSIS — H0288B Meibomian gland dysfunction left eye, upper and lower eyelids: Secondary | ICD-10-CM | POA: Diagnosis not present

## 2022-06-01 ENCOUNTER — Other Ambulatory Visit: Payer: Self-pay

## 2022-06-01 ENCOUNTER — Ambulatory Visit: Payer: Medicare Other | Admitting: Physical Therapy

## 2022-06-01 ENCOUNTER — Encounter: Payer: Self-pay | Admitting: Physical Therapy

## 2022-06-01 DIAGNOSIS — R2689 Other abnormalities of gait and mobility: Secondary | ICD-10-CM | POA: Diagnosis not present

## 2022-06-01 DIAGNOSIS — Z9181 History of falling: Secondary | ICD-10-CM

## 2022-06-01 DIAGNOSIS — M6281 Muscle weakness (generalized): Secondary | ICD-10-CM

## 2022-06-01 NOTE — Therapy (Signed)
OUTPATIENT PHYSICAL THERAPY TREATMENT NOTE    Patient Name: Francisco Padilla MRN: [id number] DOB:[birth year], 71 y.o., male Today's Date: 06/01/2022  PCP: Lajean Manes, MD   REFERRING PROVIDER: Kathalene Frames, MD   END OF SESSION:   PT End of Session - 06/01/22 1448     Visit Number 13    Number of Visits 15    Date for PT Re-Evaluation 06/14/22    Authorization Type MCR    Authorization Time Period FOTO by 15th, KX by 15th    Progress Note Due on Visit 20    PT Start Time 1445    PT Stop Time 1530    PT Time Calculation (min) 45 min    Activity Tolerance Patient tolerated treatment well    Behavior During Therapy [facility] for tasks assessed/performed                        Past Medical History:  Diagnosis Date   ADD (attention deficit disorder)    Anxiety 2007   Asthma as child   Asymptomatic gallstones    Atrial fibrillation (HCC)    Atrial flutter (HCC)    Depression    DJD (degenerative joint disease) of knee    Dysrhythmia    Chronic Atrial Fib- seeing Dr. Lenna Sciara [facility]   H/O peptic ulcer    past history with GI bleed- cauterization done throught scope   History of kidney stones    pt claims urology discovered a kidney stone last week 05/25/21   Hypercholesterolemia    Orthostatic hypotension    Pneumonia as child   Pneumothorax on left 05/20/2009   Prediabetes    none now   Pulmonary nodules    Sleep apnea    cpap set on 13   Thrombocytopenia ([place])    pt denies   Thyromegaly    Varicose veins    Past Surgical History:  Procedure Laterality Date   BIOPSY  04/18/2018   Procedure: BIOPSY;  Surgeon: Ronnette Juniper, MD;  Location: [facility];  Service: Gastroenterology;;   ESOPHAGOGASTRODUODENOSCOPY N/A 04/18/2018   Procedure: ESOPHAGOGASTRODUODENOSCOPY (EGD);  Surgeon: Ronnette Juniper, MD;  Location: [facility];  Service: Gastroenterology;  Laterality: N/A;   ESOPHAGOGASTRODUODENOSCOPY (EGD) WITH PROPOFOL N/A 01/05/2015    Procedure: ESOPHAGOGASTRODUODENOSCOPY (EGD) WITH PROPOFOL;  Surgeon: Garlan Fair, MD;  Location: [facility];  Service: Endoscopy;  Laterality: N/A;   EXTRACORPOREAL SHOCK WAVE LITHOTRIPSY Left 08/11/2021   Procedure: LEFT EXTRACORPOREAL SHOCK WAVE LITHOTRIPSY (ESWL);  Surgeon: Janith Lima, MD;  Location: [facility];  Service: Urology;  Laterality: Left;   GASTRIC ROUX-EN-Y     '08-[name] (weight stable around 302 #   KNEE ARTHROSCOPY     TONSILLECTOMY     TOTAL KNEE ARTHROPLASTY Left 06/03/2021   Procedure: LEFT TOTAL KNEE ARTHROPLASTY;  Surgeon: Newt Minion, MD;  Location: [facility];  Service: Orthopedics;  Laterality: Left;   ULNAR NERVE TRANSPOSITION Right 15 yrs ago   Patient Active Problem List   Diagnosis Date Noted   Total knee replacement status, 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   Unilateral primary osteoarthritis, left knee    Longstanding persistent atrial fibrillation ([place]) 04/26/2021   Preop cardiovascular exam 04/26/2021   Orthostatic hypotension 02/20/2019   Chronic anticoagulation 03/05/2015   Abnormal chest x-ray with multiple lung nodules 12/14/2014   AS (aortic stenosis) 12/14/2014   Atrial flutter by electrocardiogram ([place]) 12/14/2014   CAD (coronary artery disease) 12/14/2014   DJD (degenerative joint  disease) 12/14/2014   History of GI bleed 12/14/2014   Nephrolithiasis 12/14/2014   Other activity(E029.9) 12/14/2014   Prediabetes 12/14/2014   Thrombocytopenia ([place]) 12/14/2014   Arterial blood pressure decreased 10/28/2014   Atrial fibrillation ([place]) 09/02/2014   Morbid obesity ([place]) 09/02/2014   AF (paroxysmal atrial fibrillation) ([place]) 08/03/2014   Gastroduodenal ulcer 08/03/2014   Apnea, sleep 08/03/2014   Glaucoma suspect 02/21/2013   Glaucoma suspect of both eyes 02/21/2013   Herpes 04/11/2012   Routine general medical examination at a health care facility 04/11/2012   DYSLIPIDEMIA 05/31/2009   DEPRESSION/ANXIETY 05/31/2009   ATTENTION DEFICIT  DISORDER 05/31/2009   Obstructive sleep apnea 05/31/2009   GERD 05/31/2009   GOUT, HX OF 05/31/2009    REFERRING DIAG: Unspecified fall, Other abnormalities of gait and mobility  THERAPY DIAG:  Other abnormalities of gait and mobility  Muscle weakness (generalized)  History of falling  Rationale for Evaluation and Treatment Rehabilitation  PERTINENT HISTORY: Left TKA 06/03/2021, ADD, OCD, anxiety, depression  PRECAUTIONS: Fall   SUBJECTIVE: Patient reports he is doing well. He had al his eye surgeries canceled and will follow-up with his surgeon to reschedule them. He feels he continues to get benefit from therapy.  PAIN:  Are you having pain? No  PATIENT GOALS: Improve balance and falls   OBJECTIVE: (objective measures completed at initial evaluation unless otherwise dated) PATIENT SURVEYS:  FOTO 49% functional status  04/12/2022: 52%  05/10/2022: 52%   SENSATION: Patient reports numbness due to neuropathy   POSTURE:            Rounded shoulder posture   LOWER EXTREMITY MMT:   MMT Right eval Left eval Rt / Lt 03/09/2022 Rt / Lt 05/18/2022  Hip flexion 4 4    Hip extension 4- 4-    Hip abduction 4- 4- 4- / 4- 4- / 4-  Knee flexion 5 5    Knee extension 5 5    Ankle dorsiflexion 5 5    Ankle plantarflexion 4 4      FUNCTIONAL TESTS:  5 times sit to stand: 14 seconds  03/22/2022: 12 seconds 6 minute walk test: 1235 ft  04/19/2022: 1,460 ft Berg Balance Scale: 52/56  04/19/2022: 54/56  GAIT: Distance walked: 1235 Assistive device utilized: None Level of assistance: Complete Independence Comments: patient with left trunk lean, trendelenburg on leg, slightly antalgic on right, patient with occasional unsteady gait with one instance of poor foot clearance on left with stumble     TODAY'S TREATMENT: [facility] Adult PT Treatment:                                                DATE: 06/01/2022 Therapeutic Exercise: NuStep L7 x 5 min with LE/UE  while taking  subjective Leg press (cybex) 130# 3 x 15 Knee extension machine 35# 3 x 15 Side stepping with green at mid shin in // 2 x 3 lengths down/back Standing heel raises 2 x 20 Knee flexion machine 45# 2 x 10 Sidelying hip abduction x 20 each Neuro Re-ed: Tandem stance 2 x 30 sec each Romberg on Airex 2 x 60 sec   [facility] Adult PT Treatment:  DATE: 05/24/2022 Therapeutic Exercise: NuStep L7 x 5 min with LE/UE  while taking subjective Backward and side stepping with FM 13# x 5 each Leg press (cybex) 130# 3 x 10 Knee extension machine 35# 2 x 10 Knee flexion machine 45# 2 x 10 Standing heel raises x 20 Sidelying hip abduction x 20 each  [facility] Adult PT Treatment:                                                DATE: 05/18/2022 Therapeutic Exercise: NuStep L7 x 5 min with LE/UE  while taking subjective Leg press (cybex) 120# 3 x 10 Knee extension machine 35# 3 x 10 Backward and side stepping with FM 10# x 5 each Forward 10" step-up 2 x 10 each Neuro Reed: Rockerboard fwd/bwd taps 2 x 1 min 3/4 tandem 2 x 30 sec each   PATIENT EDUCATION:  Education details: HEP Person educated: Patient Education method: Consulting civil engineer, Media planner, Corporate treasurer cues, Verbal cues Education comprehension: verbalized understanding, returned demonstration, verbal cues required, tactile cues required, and needs further education   HOME EXERCISE PROGRAM: Access Code: [email or web address]      ASSESSMENT: CLINICAL IMPRESSION: Patient tolerated therapy well with no adverse effects. Therapy continues to focus on strengthening and balance training. He exhibits improvement with balance training this visit and only required occasional use of UE for support with tandem stance. He was able to perform higher repetitions with strengthening this visit and no reported pain. No changes to HEP this visit. Patient would benefit from continued skilled PT to progress his strength and balance in order  to improve walking and reduce fall risk.    OBJECTIVE IMPAIRMENTS Abnormal gait, decreased activity tolerance, decreased balance, decreased strength, impaired sensation, and pain.    ACTIVITY LIMITATIONS stairs and locomotion level   PARTICIPATION LIMITATIONS: shopping and community activity   PERSONAL FACTORS Behavior pattern, Past/current experiences, Time since onset of injury/illness/exacerbation, and 3+ comorbidities: see above  are also affecting patient's functional outcome.      GOALS: Goals reviewed with patient? Yes   SHORT TERM GOALS: Target date: 03/22/2022    Patient will be I with initial HEP in order to progress with therapy. Baseline: HEP provided at eval 03/22/2022: independent Goal status: MET   2.  PT will review FOTO with patient by 3rd visit in order to understand expected progress and outcome with therapy. Baseline: FOTO assessed at eval 03/22/2022: reviewed Goal status: MET   3.  Patient will demonstrate 5xSTS </= 12 sec in order to indicate improved LE strength and reduced fall risk Baseline: 14 sec 03/22/2022: 12 sec Goal status: MET   LONG TERM GOALS: Target date: 06/14/2022   Patient will be I with final HEP to maintain progress from PT. Baseline: HEP provided at eval 04/19/2022: independent Goal status: ONGOING   2.  Patient will report >/= 63% status on FOTO to indicate improved functional ability. Baseline: 49% functional status 04/12/2022: 52% 05/10/2022: 52% Goal status: PARTIALLY MET   3.  Patient will perform 6MWT >/= 1635 ft in order to improve community access Baseline: 1235 ft 04/19/2022: 1,460 ft Goal status: PARTIALLY MET   4.  Patient will demonstrate BERG >/= 55/56 in order to indicate improved balance and reduced fall risk Baseline: 52/56 04/19/2022: 54/56 Goal status: PARTIALLY MET     PLAN: PT FREQUENCY: 1x/week   PT DURATION:  8 weeks   PLANNED INTERVENTIONS: Therapeutic exercises, Therapeutic activity, Neuromuscular  re-education, Balance training, Gait training, Patient/Family education, Self Care, Joint mobilization, Stair training, Aquatic Therapy, Dry Needling, Cryotherapy, Moist heat, Manual therapy, and Re-evaluation   PLAN FOR NEXT SESSION: Review HEP and progress PRN, initiate hip and LE strengthening progressing to closed chair exercises such as step-ups, balance training progressing to dynamic    Hilda Blades, PT, DPT, LAT, ATC 06/01/22  3:32 PM Phone: [phone] Fax: [phone]

## 2022-06-06 ENCOUNTER — Ambulatory Visit (INDEPENDENT_AMBULATORY_CARE_PROVIDER_SITE_OTHER): Payer: Medicare Other | Admitting: Podiatry

## 2022-06-06 DIAGNOSIS — B351 Tinea unguium: Secondary | ICD-10-CM | POA: Diagnosis not present

## 2022-06-06 DIAGNOSIS — M79675 Pain in left toe(s): Secondary | ICD-10-CM

## 2022-06-06 DIAGNOSIS — M79674 Pain in right toe(s): Secondary | ICD-10-CM

## 2022-06-06 DIAGNOSIS — G629 Polyneuropathy, unspecified: Secondary | ICD-10-CM

## 2022-06-07 ENCOUNTER — Other Ambulatory Visit: Payer: Self-pay

## 2022-06-07 ENCOUNTER — Ambulatory Visit: Payer: Medicare Other | Admitting: Physical Therapy

## 2022-06-07 ENCOUNTER — Encounter: Payer: Self-pay | Admitting: Physical Therapy

## 2022-06-07 DIAGNOSIS — M6281 Muscle weakness (generalized): Secondary | ICD-10-CM

## 2022-06-07 DIAGNOSIS — Z9181 History of falling: Secondary | ICD-10-CM

## 2022-06-07 DIAGNOSIS — R2689 Other abnormalities of gait and mobility: Secondary | ICD-10-CM | POA: Diagnosis not present

## 2022-06-07 NOTE — Therapy (Signed)
OUTPATIENT PHYSICAL THERAPY TREATMENT NOTE    Patient Name: Francisco Padilla MRN: [id number] DOB:08/01/1950, 71 y.o., male Today's Date: 06/07/2022  PCP: Lajean Manes, MD   REFERRING PROVIDER: Kathalene Frames, MD   END OF SESSION:   PT End of Session - 06/07/22 1451     Visit Number 14    Number of Visits 15    Date for PT Re-Evaluation 06/14/22    Authorization Type MCR    Authorization Time Period FOTO by 15th, KX by 15th    Progress Note Due on Visit 20    PT Start Time 1445    PT Stop Time 1530    PT Time Calculation (min) 45 min    Activity Tolerance Patient tolerated treatment well    Behavior During Therapy [facility] for tasks assessed/performed                         Past Medical History:  Diagnosis Date   ADD (attention deficit disorder)    Anxiety 2007   Asthma as child   Asymptomatic gallstones    Atrial fibrillation (HCC)    Atrial flutter (HCC)    Depression    DJD (degenerative joint disease) of knee    Dysrhythmia    Chronic Atrial Fib- seeing Dr. Lenna Sciara [facility]   H/O peptic ulcer    past history with GI bleed- cauterization done throught scope   History of kidney stones    pt claims urology discovered a kidney stone last week 05/25/21   Hypercholesterolemia    Orthostatic hypotension    Pneumonia as child   Pneumothorax on left 05/20/2009   Prediabetes    none now   Pulmonary nodules    Sleep apnea    cpap set on 13   Thrombocytopenia ([place])    pt denies   Thyromegaly    Varicose veins    Past Surgical History:  Procedure Laterality Date   BIOPSY  04/18/2018   Procedure: BIOPSY;  Surgeon: Ronnette Juniper, MD;  [facility];  Service: Gastroenterology;;   ESOPHAGOGASTRODUODENOSCOPY N/A 04/18/2018   Procedure: ESOPHAGOGASTRODUODENOSCOPY (EGD);  Surgeon: Ronnette Juniper, MD;  Location: [facility];  Service: Gastroenterology;  Laterality: N/A;   ESOPHAGOGASTRODUODENOSCOPY (EGD) WITH PROPOFOL N/A 01/05/2015    Procedure: ESOPHAGOGASTRODUODENOSCOPY (EGD) WITH PROPOFOL;  Surgeon: Garlan Fair, MD;  [facility];  Service: Endoscopy;  Laterality: N/A;   EXTRACORPOREAL SHOCK WAVE LITHOTRIPSY Left 08/11/2021   Procedure: LEFT EXTRACORPOREAL SHOCK WAVE LITHOTRIPSY (ESWL);  Surgeon: Janith Lima, MD;  Location: [facility];  Service: Urology;  Laterality: Left;   GASTRIC ROUX-EN-Y     '08-[name] (weight stable around 302 #   KNEE ARTHROSCOPY     TONSILLECTOMY     TOTAL KNEE ARTHROPLASTY Left 06/03/2021   Procedure: LEFT TOTAL KNEE ARTHROPLASTY;  Surgeon: Newt Minion, MD;  Location: [place];  Service: Orthopedics;  Laterality: Left;   ULNAR NERVE TRANSPOSITION Right 15 yrs ago   Patient Active Problem List   Diagnosis Date Noted   Total knee replacement status, 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   Unilateral primary osteoarthritis, left knee    Longstanding persistent atrial fibrillation ([place]) 04/26/2021   Preop cardiovascular exam 04/26/2021   Orthostatic hypotension 02/20/2019   Chronic anticoagulation 03/05/2015   Abnormal chest x-ray with multiple lung nodules 12/14/2014   AS (aortic stenosis) 12/14/2014   Atrial flutter by electrocardiogram ([place]) 12/14/2014   CAD (coronary artery disease) 12/14/2014   DJD (degenerative  joint disease) 12/14/2014   History of GI bleed 12/14/2014   Nephrolithiasis 12/14/2014   Other activity(E029.9) 12/14/2014   Prediabetes 12/14/2014   Thrombocytopenia ([place]) 12/14/2014   Arterial blood pressure decreased 10/28/2014   Atrial fibrillation ([place]) 09/02/2014   Morbid obesity ([place]) 09/02/2014   AF (paroxysmal atrial fibrillation) ([place]) 08/03/2014   Gastroduodenal ulcer 08/03/2014   Apnea, sleep 08/03/2014   Glaucoma suspect 02/21/2013   Glaucoma suspect of both eyes 02/21/2013   Herpes 04/11/2012   Routine general medical examination at a health care facility 04/11/2012   DYSLIPIDEMIA 05/31/2009   DEPRESSION/ANXIETY 05/31/2009   ATTENTION DEFICIT  DISORDER 05/31/2009   Obstructive sleep apnea 05/31/2009   GERD 05/31/2009   GOUT, HX OF 05/31/2009    REFERRING DIAG: Unspecified fall, Other abnormalities of gait and mobility  THERAPY DIAG:  Other abnormalities of gait and mobility  Muscle weakness (generalized)  History of falling  Rationale for Evaluation and Treatment Rehabilitation  PERTINENT HISTORY: Left TKA 06/03/2021, ADD, OCD, anxiety, depression  PRECAUTIONS: Fall   SUBJECTIVE: Patient reports he is doing well. He had al his eye surgeries canceled and will follow-up with his surgeon to reschedule them. He feels he continues to get benefit from therapy.  PAIN:  Are you having pain? No  PATIENT GOALS: Improve balance and falls   OBJECTIVE: (objective measures completed at initial evaluation unless otherwise dated) PATIENT SURVEYS:  FOTO 49% functional status  04/12/2022: 52%  05/10/2022: 52%   SENSATION: Patient reports numbness due to neuropathy   POSTURE:            Rounded shoulder posture   LOWER EXTREMITY MMT:   MMT Right eval Left eval Rt / Lt 03/09/2022 Rt / Lt 05/18/2022 Rt / Lt 06/07/2022  Hip flexion 4 4     Hip extension 4- 4-     Hip abduction 4- 4- 4- / 4- 4- / 4- 4 / 4  Knee flexion 5 5     Knee extension 5 5     Ankle dorsiflexion 5 5     Ankle plantarflexion 4 4       FUNCTIONAL TESTS:  5 times sit to stand: 14 seconds  03/22/2022: 12 seconds 6 minute walk test: 1235 ft  04/19/2022: 1,460 ft Berg Balance Scale: 52/56  04/19/2022: 54/56  GAIT: Distance walked: 1235 Assistive device utilized: None Level of assistance: Complete Independence Comments: patient with left trunk lean, trendelenburg on leg, slightly antalgic on right, patient with occasional unsteady gait with one instance of poor foot clearance on left with stumble     TODAY'S TREATMENT: [facility] Adult PT Treatment:                                                DATE: 06/07/2022 Therapeutic Exercise: NuStep L7 x 5  min with LE/UE  while taking subjective Leg press (cybex) 140# 3 x 10 Resisted stepping all directions at FM 17# x 5 lengths each Knee extension machine 35# 3 x 15 Neuro Re-ed: Tandem stance 3 x 30 sec each Rockerboard fwd/bwd and lateral taps 2 x 20 each   [facility] Adult PT Treatment:  DATE: 06/01/2022 Therapeutic Exercise: NuStep L7 x 5 min with LE/UE  while taking subjective Leg press (cybex) 130# 3 x 15 Knee extension machine 35# 3 x 15 Side stepping with green at mid shin in // 2 x 3 lengths down/back Standing heel raises 2 x 20 Knee flexion machine 45# 2 x 10 Sidelying hip abduction x 20 each Neuro Re-ed: Tandem stance 2 x 30 sec each Romberg on Airex 2 x 60 sec  [facility] Adult PT Treatment:                                                DATE: 05/24/2022 Therapeutic Exercise: NuStep L7 x 5 min with LE/UE  while taking subjective Backward and side stepping with FM 13# x 5 each Leg press (cybex) 130# 3 x 10 Knee extension machine 35# 2 x 10 Knee flexion machine 45# 2 x 10 Standing heel raises x 20 Sidelying hip abduction x 20 each   PATIENT EDUCATION:  Education details: HEP Person educated: Patient Education method: Consulting civil engineer, Demonstration, Corporate treasurer cues, Verbal cues Education comprehension: verbalized understanding, returned demonstration, verbal cues required, tactile cues required, and needs further education   HOME EXERCISE PROGRAM: Access Code: [email or web address]      ASSESSMENT: CLINICAL IMPRESSION: Patient tolerated therapy well with no adverse effects. Therapy focused on progressing LE strength and balance training to improve steadiness. He is tolerating increased resistance with with exercises and continued with the resistance walking to improve his control and stability in standing. He is demonstrating improvement with his balance and did not require UE support this visit, and exhibits improve hip strength this visit. No changes  to HEP this visit. Patient would benefit from continued skilled PT to progress his strength and balance in order to improve walking and reduce fall risk.    OBJECTIVE IMPAIRMENTS Abnormal gait, decreased activity tolerance, decreased balance, decreased strength, impaired sensation, and pain.    ACTIVITY LIMITATIONS stairs and locomotion level   PARTICIPATION LIMITATIONS: shopping and community activity   PERSONAL FACTORS Behavior pattern, Past/current experiences, Time since onset of injury/illness/exacerbation, and 3+ comorbidities: see above  are also affecting patient's functional outcome.      GOALS: Goals reviewed with patient? Yes   SHORT TERM GOALS: Target date: 03/22/2022    Patient will be I with initial HEP in order to progress with therapy. Baseline: HEP provided at eval 03/22/2022: independent Goal status: MET   2.  PT will review FOTO with patient by 3rd visit in order to understand expected progress and outcome with therapy. Baseline: FOTO assessed at eval 03/22/2022: reviewed Goal status: MET   3.  Patient will demonstrate 5xSTS </= 12 sec in order to indicate improved LE strength and reduced fall risk Baseline: 14 sec 03/22/2022: 12 sec Goal status: MET   LONG TERM GOALS: Target date: 06/14/2022   Patient will be I with final HEP to maintain progress from PT. Baseline: HEP provided at eval 04/19/2022: independent Goal status: ONGOING   2.  Patient will report >/= 63% status on FOTO to indicate improved functional ability. Baseline: 49% functional status 04/12/2022: 52% 05/10/2022: 52% Goal status: PARTIALLY MET   3.  Patient will perform 6MWT >/= 1635 ft in order to improve community access Baseline: 1235 ft 04/19/2022: 1,460 ft Goal status: PARTIALLY MET   4.  Patient will demonstrate BERG >/= 55/56 in order  to indicate improved balance and reduced fall risk Baseline: 52/56 04/19/2022: 54/56 Goal status: PARTIALLY MET     PLAN: PT FREQUENCY: 1x/week    PT DURATION: 8 weeks   PLANNED INTERVENTIONS: Therapeutic exercises, Therapeutic activity, Neuromuscular re-education, Balance training, Gait training, Patient/Family education, Self Care, Joint mobilization, Stair training, Aquatic Therapy, Dry Needling, Cryotherapy, Moist heat, Manual therapy, and Re-evaluation   PLAN FOR NEXT SESSION: Review HEP and progress PRN, initiate hip and LE strengthening progressing to closed chair exercises such as step-ups, balance training progressing to dynamic    Hilda Blades, PT, DPT, LAT, ATC 06/07/22  3:31 PM Phone: [phone] Fax: [phone]

## 2022-06-07 NOTE — Progress Notes (Signed)
Subjective: 71 y.o. returns the office today for painful, elongated, thickened toenails which he cannot trim himself.  Still on gabapentin for neuropathy.  States he is still a numbness to his feet but no burning or tingling.  Denies any new concerns.  No open lesions.  Kathalene Frames, MD  Objective: AAO 3, NAD DP/PT pulses palpable, CRT less than 3 seconds Sensation decreased. Nails hypertrophic, dystrophic, elongated, brittle, discolored 10. There is tenderness overlying the nails 1-5 bilaterally. There is no surrounding erythema or drainage along the nail sites. No pain with calf compression, swelling, warmth, erythema.  Assessment: Patient presents with symptomatic onychomycosis, neuropathy  Plan: -Treatment options including alternatives, risks, complications were discussed -Nails sharply debrided 10 without complication/bleeding. -Continue gabapentin.  Monitor for any signs or symptoms of worsening neuropathy. -Discussed daily foot inspection. If there are any changes, to call the office immediately.  -Follow-up in 3 months or sooner if any problems are to arise. In the meantime, encouraged to call the office with any questions, concerns, changes symptoms.  Celesta Gentile, DPM

## 2022-06-09 DIAGNOSIS — R051 Acute cough: Secondary | ICD-10-CM | POA: Diagnosis not present

## 2022-06-09 DIAGNOSIS — J029 Acute pharyngitis, unspecified: Secondary | ICD-10-CM | POA: Diagnosis not present

## 2022-06-09 DIAGNOSIS — Z03818 Encounter for observation for suspected exposure to other biological agents ruled out: Secondary | ICD-10-CM | POA: Diagnosis not present

## 2022-06-09 DIAGNOSIS — Z789 Other specified health status: Secondary | ICD-10-CM | POA: Diagnosis not present

## 2022-06-09 DIAGNOSIS — J3489 Other specified disorders of nose and nasal sinuses: Secondary | ICD-10-CM | POA: Diagnosis not present

## 2022-06-12 ENCOUNTER — Ambulatory Visit: Payer: Medicare Other | Admitting: Physical Therapy

## 2022-06-12 ENCOUNTER — Encounter: Payer: Self-pay | Admitting: Physical Therapy

## 2022-06-12 ENCOUNTER — Other Ambulatory Visit: Payer: Self-pay

## 2022-06-12 DIAGNOSIS — M6281 Muscle weakness (generalized): Secondary | ICD-10-CM | POA: Diagnosis not present

## 2022-06-12 DIAGNOSIS — S63502A Unspecified sprain of left wrist, initial encounter: Secondary | ICD-10-CM | POA: Diagnosis not present

## 2022-06-12 DIAGNOSIS — Z9181 History of falling: Secondary | ICD-10-CM

## 2022-06-12 DIAGNOSIS — R2689 Other abnormalities of gait and mobility: Secondary | ICD-10-CM | POA: Diagnosis not present

## 2022-06-12 DIAGNOSIS — S6992XA Unspecified injury of left wrist, hand and finger(s), initial encounter: Secondary | ICD-10-CM | POA: Diagnosis not present

## 2022-06-12 NOTE — Patient Instructions (Signed)
Access Code: [email or web address] URL: [email or web address] Date: 06/12/2022 Prepared by: Hilda Blades  Exercises - Standing Tandem Balance with Counter Support  - 3 x daily - 3 reps - 30 seconds hold - Sidelying Hip Abduction  - 1 x daily - 2 sets - 15 reps - Standing Row with Anchored Resistance  - 1 x daily - 3 sets - 20 reps - Sit to Stand Without Arm Support  - 1 x daily - 3 sets - 10 reps - Heel Raises with Counter Support  - 1 x daily - 3 sets - 20 reps

## 2022-06-12 NOTE — Therapy (Signed)
OUTPATIENT PHYSICAL THERAPY TREATMENT NOTE  DISCHARGE    Patient Name: Francisco Padilla MRN: [id number] DOB:Aug 13, 1950, 71 y.o., male Today's Date: 06/12/2022  PCP: Lajean Manes, MD   REFERRING PROVIDER: Kathalene Frames, MD   END OF SESSION:   PT End of Session - 06/12/22 1449     Visit Number 15    Number of Visits 15    Date for PT Re-Evaluation 06/14/22    Authorization Type MCR    Authorization Time Period FOTO by 15th, KX by 15th    Progress Note Due on Visit 20    PT Start Time 1445    PT Stop Time 1530    PT Time Calculation (min) 45 min    Activity Tolerance Patient tolerated treatment well    Behavior During Therapy [facility] for tasks assessed/performed                 Past Medical History:  Diagnosis Date   ADD (attention deficit disorder)    Anxiety 2007   Asthma as child   Asymptomatic gallstones    Atrial fibrillation (HCC)    Atrial flutter (HCC)    Depression    DJD (degenerative joint disease) of knee    Dysrhythmia    Chronic Atrial Fib- seeing Dr. Lenna Sciara [facility]   H/O peptic ulcer    past history with GI bleed- cauterization done throught scope   History of kidney stones    pt claims urology discovered a kidney stone last week 05/25/21   Hypercholesterolemia    Orthostatic hypotension    Pneumonia as child   Pneumothorax on left 05/20/2009   Prediabetes    none now   Pulmonary nodules    Sleep apnea    cpap set on 13   Thrombocytopenia ([place])    pt denies   Thyromegaly    Varicose veins    Past Surgical History:  Procedure Laterality Date   BIOPSY  04/18/2018   Procedure: BIOPSY;  Surgeon: Ronnette Juniper, MD;  Location: [facility];  Service: Gastroenterology;;   ESOPHAGOGASTRODUODENOSCOPY N/A 04/18/2018   Procedure: ESOPHAGOGASTRODUODENOSCOPY (EGD);  Surgeon: Ronnette Juniper, MD;  Location: [facility];  Service: Gastroenterology;  Laterality: N/A;   ESOPHAGOGASTRODUODENOSCOPY (EGD) WITH PROPOFOL N/A 01/05/2015    Procedure: ESOPHAGOGASTRODUODENOSCOPY (EGD) WITH PROPOFOL;  Surgeon: Garlan Fair, MD;  Location: [facility];  Service: Endoscopy;  Laterality: N/A;   EXTRACORPOREAL SHOCK WAVE LITHOTRIPSY Left 08/11/2021   Procedure: LEFT EXTRACORPOREAL SHOCK WAVE LITHOTRIPSY (ESWL);  Surgeon: Janith Lima, MD;  Location: [facility];  Service: Urology;  Laterality: Left;   GASTRIC ROUX-EN-Y     '08-[name] (weight stable around 302 #   KNEE ARTHROSCOPY     TONSILLECTOMY     TOTAL KNEE ARTHROPLASTY Left 06/03/2021   Procedure: LEFT TOTAL KNEE ARTHROPLASTY;  Surgeon: Newt Minion, MD;  Location: [place];  Service: Orthopedics;  Laterality: Left;   ULNAR NERVE TRANSPOSITION Right 15 yrs ago   Patient Active Problem List   Diagnosis Date Noted   Total knee replacement status, 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   Unilateral primary osteoarthritis, left knee    Longstanding persistent atrial fibrillation ([place]) 04/26/2021   Preop cardiovascular exam 04/26/2021   Orthostatic hypotension 02/20/2019   Chronic anticoagulation 03/05/2015   Abnormal chest x-ray with multiple lung nodules 12/14/2014   AS (aortic stenosis) 12/14/2014   Atrial flutter by electrocardiogram ([place]) 12/14/2014   CAD (coronary artery disease) 12/14/2014   DJD (degenerative joint disease) 12/14/2014   History  of GI bleed 12/14/2014   Nephrolithiasis 12/14/2014   Other activity(E029.9) 12/14/2014   Prediabetes 12/14/2014   Thrombocytopenia ([place]) 12/14/2014   Arterial blood pressure decreased 10/28/2014   Atrial fibrillation ([place]) 09/02/2014   Morbid obesity ([place]) 09/02/2014   AF (paroxysmal atrial fibrillation) ([place]) 08/03/2014   Gastroduodenal ulcer 08/03/2014   Apnea, sleep 08/03/2014   Glaucoma suspect 02/21/2013   Glaucoma suspect of both eyes 02/21/2013   Herpes 04/11/2012   Routine general medical examination at a health care facility 04/11/2012   DYSLIPIDEMIA 05/31/2009   DEPRESSION/ANXIETY 05/31/2009   ATTENTION DEFICIT  DISORDER 05/31/2009   Obstructive sleep apnea 05/31/2009   GERD 05/31/2009   GOUT, HX OF 05/31/2009    REFERRING DIAG: Unspecified fall, Other abnormalities of gait and mobility  THERAPY DIAG:  Other abnormalities of gait and mobility  Muscle weakness (generalized)  History of falling  Rationale for Evaluation and Treatment Rehabilitation  PERTINENT HISTORY: Left TKA 06/03/2021, ADD, OCD, anxiety, depression  PRECAUTIONS: Fall   SUBJECTIVE: Patient reports he was having some pain yesterday, it seems his neuropathy aggravated him and he was having some achiness. He is consistent with his exercise and continues to work out and exercise at the gym.   PAIN:  Are you having pain? No  PATIENT GOALS: Improve balance and falls   OBJECTIVE: (objective measures completed at initial evaluation unless otherwise dated) PATIENT SURVEYS:  FOTO 49% functional status  04/12/2022: 52%  05/10/2022: 52%  06/12/2022: 42%   SENSATION: Patient reports numbness due to neuropathy   POSTURE:            Rounded shoulder posture   LOWER EXTREMITY MMT:   MMT Right eval Left eval Rt / Lt 03/09/2022 Rt / Lt 05/18/2022 Rt / Lt 06/07/2022  Hip flexion 4 4     Hip extension 4- 4-     Hip abduction 4- 4- 4- / 4- 4- / 4- 4 / 4  Knee flexion 5 5     Knee extension 5 5     Ankle dorsiflexion 5 5     Ankle plantarflexion 4 4       FUNCTIONAL TESTS:  5 times sit to stand: 14 seconds  03/22/2022: 12 seconds 6 minute walk test: 1235 ft  04/19/2022: 1,460 ft  06/12/2022: 1,930 ft Berg Balance Scale: 52/56  04/19/2022: 54/56  06/12/2022: 55/56  BERG BALANCE TEST Sitting to Standing: 4.      Stands without using hands and stabilize independently Standing Unsupported: 4.      Stands safely for 2 minutes Sitting Unsupported: 4.     Sits for 2 minutes independently Standing to Sitting: 4.     Sits safely with minimal use of hands Transfers: 4.     Transfers safely with minor use of  hands Standing with eyes closed: 4.     Stands safely for 10 seconds  Standing with feet together: 4.     Stands for 1 minute safely Reaching forward with outstretched arm: 4.     Reaches forward 10 inches Retrieving object from the floor: 4.      Able to pick up easily and safely Turning to look behind: 4.     Looks behind from both sides and weight shifts well Turning 360 degrees: 4.     Able to turn in </=4 seconds  Place alternate foot on stool: 4.     Completes 8 steps in 20 seconds     Standing with one foot in front:  4.     Independent tandem for 30 seconds  Standing on one foot: 3.     Holds 5-10 seconds Total Score: 55/56  GAIT: Distance walked: 1235 Assistive device utilized: None Level of assistance: Complete Independence Comments: patient with left trunk lean, trendelenburg on leg, slightly antalgic on right, patient with occasional unsteady gait with one instance of poor foot clearance on left with stumble     TODAY'S TREATMENT: [facility] Adult PT Treatment:                                                DATE: 06/12/2022 Therapeutic Activity: Goal reassessment including FOTO, BERG, 6MWT, and discussion of progress toward goals and indications for discharge from PT   [facility] Adult PT Treatment:                                                DATE: 06/07/2022 Therapeutic Exercise: NuStep L7 x 5 min with LE/UE  while taking subjective Leg press (cybex) 140# 3 x 10 Resisted stepping all directions at FM 17# x 5 lengths each Knee extension machine 35# 3 x 15 Neuro Re-ed: Tandem stance 3 x 30 sec each Rockerboard fwd/bwd and lateral taps 2 x 20 each  [facility] Adult PT Treatment:                                                DATE: 06/01/2022 Therapeutic Exercise: NuStep L7 x 5 min with LE/UE  while taking subjective Leg press (cybex) 130# 3 x 15 Knee extension machine 35# 3 x 15 Side stepping with green at mid shin in // 2 x 3 lengths down/back Standing heel raises 2 x 20 Knee  flexion machine 45# 2 x 10 Sidelying hip abduction x 20 each Neuro Re-ed: Tandem stance 2 x 30 sec each Romberg on Airex 2 x 60 sec   PATIENT EDUCATION:  Education details: POC discharge, FOTO, HEP Person educated: Patient Education method: Explanation Education comprehension: Verbalized understanding   HOME EXERCISE PROGRAM: Access Code: [email or web address]      ASSESSMENT: CLINICAL IMPRESSION: Therapy session focused on reassessment for discharge purposes. He demonstrates overall improvement in his balance measures and walking ability, but did report a reduction in functional ability on FOTO. Patient had achieved all FOTO goals except FOTO, and is independent with his HEP and goes to the gym frequently for aerobic and strengthening exercise. It was agreed upon that patent will discharged from PT and will follow-up with his PCP as needed.  Following the completion of the 6MWT and as walking back to the chair, patient caught his right toe and fell forward landing on his left elbow and wrist. No gait belt was being used as he demonstrated good balance with BERG and seemed safe with his walking. He denied hitting his head during the fall, and did exhibit abrasions on the left medial forearm. He did state he hit his left knee but this was not painful. Patient was allowed to sit on the floor for a few minutes before he was able to stand up from the floor independently  and walk to a nearby chair where he was provided band aids for the abrasions on the left forearm and ice pack for the left wrist and elbow. Patient was able to demonstrate full range of motion of left shoulder, elbow, and wrist, and able to hold against resistance without an increase in pain. Patient denies any other injuries and sat in the chair for 15 minutes prior to end of session. PT walked out to the patient's car with him to ensure safety and he was able to walk independently and denies any further injury. He was instructed to contact his  PCP and/or go to urgent care if he feels he has any further injuries. Patient expressed understanding regarding instructions and states he did not have any further injuries.     OBJECTIVE IMPAIRMENTS Abnormal gait, decreased activity tolerance, decreased balance, decreased strength, impaired sensation, and pain.    ACTIVITY LIMITATIONS stairs and locomotion level   PARTICIPATION LIMITATIONS: shopping and community activity   PERSONAL FACTORS Behavior pattern, Past/current experiences, Time since onset of injury/illness/exacerbation, and 3+ comorbidities: see above  are also affecting patient's functional outcome.      GOALS: Goals reviewed with patient? Yes   SHORT TERM GOALS: Target date: 03/22/2022    Patient will be I with initial HEP in order to progress with therapy. Baseline: HEP provided at eval 03/22/2022: independent Goal status: MET   2.  PT will review FOTO with patient by 3rd visit in order to understand expected progress and outcome with therapy. Baseline: FOTO assessed at eval 03/22/2022: reviewed Goal status: MET   3.  Patient will demonstrate 5xSTS </= 12 sec in order to indicate improved LE strength and reduced fall risk Baseline: 14 sec 03/22/2022: 12 sec Goal status: MET   LONG TERM GOALS: Target date: 06/14/2022   Patient will be I with final HEP to maintain progress from PT. Baseline: HEP provided at eval 04/19/2022: independent 06/12/2022: independent Goal status: MET   2.  Patient will report >/= 63% status on FOTO to indicate improved functional ability. Baseline: 49% functional status 04/12/2022: 52% 05/10/2022: 52% 06/12/2022: 42% Goal status: NOT MET   3.  Patient will perform 6MWT >/= 1635 ft in order to improve community access Baseline: 1235 ft 04/19/2022: 1,460 ft 06/12/2022: 1,930 ft Goal status: MET   4.  Patient will demonstrate BERG >/= 55/56 in order to indicate improved balance and reduced fall risk Baseline: 52/56 04/19/2022:  54/56 06/12/2022: 55/56 Goal status: MET     PLAN: PT FREQUENCY: -   PT DURATION: -   PLANNED INTERVENTIONS: Therapeutic exercises, Therapeutic activity, Neuromuscular re-education, Balance training, Gait training, Patient/Family education, Self Care, Joint mobilization, Stair training, Aquatic Therapy, Dry Needling, Cryotherapy, Moist heat, Manual therapy, and Re-evaluation   PLAN FOR NEXT SESSION: NA - discharge    Hilda Blades, PT, DPT, LAT, ATC 06/12/22  4:55 PM Phone: [phone] Fax: [phone]   PHYSICAL THERAPY DISCHARGE SUMMARY  Visits from Start of Care: 15  Current functional level related to goals / functional outcomes: See above   Remaining deficits: See above, FOTO   Education / Equipment: HEP, follow-up with PCP or urgent care following fall in therapy   Patient agrees to discharge. Patient goals were not met. Patient is being discharged due to being pleased with the current functional level.

## 2022-06-20 DIAGNOSIS — N5201 Erectile dysfunction due to arterial insufficiency: Secondary | ICD-10-CM | POA: Diagnosis not present

## 2022-06-21 DIAGNOSIS — H0288A Meibomian gland dysfunction right eye, upper and lower eyelids: Secondary | ICD-10-CM | POA: Diagnosis not present

## 2022-06-21 DIAGNOSIS — H04123 Dry eye syndrome of bilateral lacrimal glands: Secondary | ICD-10-CM | POA: Diagnosis not present

## 2022-06-21 DIAGNOSIS — H0288B Meibomian gland dysfunction left eye, upper and lower eyelids: Secondary | ICD-10-CM | POA: Diagnosis not present

## 2022-06-21 DIAGNOSIS — H401122 Primary open-angle glaucoma, left eye, moderate stage: Secondary | ICD-10-CM | POA: Diagnosis not present

## 2022-06-21 DIAGNOSIS — H401111 Primary open-angle glaucoma, right eye, mild stage: Secondary | ICD-10-CM | POA: Diagnosis not present

## 2022-06-30 DIAGNOSIS — H25813 Combined forms of age-related cataract, bilateral: Secondary | ICD-10-CM | POA: Diagnosis not present

## 2022-07-05 DIAGNOSIS — N200 Calculus of kidney: Secondary | ICD-10-CM | POA: Diagnosis not present

## 2022-07-05 DIAGNOSIS — N5201 Erectile dysfunction due to arterial insufficiency: Secondary | ICD-10-CM | POA: Diagnosis not present

## 2022-07-10 DIAGNOSIS — H04123 Dry eye syndrome of bilateral lacrimal glands: Secondary | ICD-10-CM | POA: Diagnosis not present

## 2022-07-10 DIAGNOSIS — H25813 Combined forms of age-related cataract, bilateral: Secondary | ICD-10-CM | POA: Diagnosis not present

## 2022-07-10 DIAGNOSIS — H0288B Meibomian gland dysfunction left eye, upper and lower eyelids: Secondary | ICD-10-CM | POA: Diagnosis not present

## 2022-07-10 DIAGNOSIS — H353131 Nonexudative age-related macular degeneration, bilateral, early dry stage: Secondary | ICD-10-CM | POA: Diagnosis not present

## 2022-07-10 DIAGNOSIS — H401111 Primary open-angle glaucoma, right eye, mild stage: Secondary | ICD-10-CM | POA: Diagnosis not present

## 2022-07-10 DIAGNOSIS — H401122 Primary open-angle glaucoma, left eye, moderate stage: Secondary | ICD-10-CM | POA: Diagnosis not present

## 2022-07-10 DIAGNOSIS — H18513 Endothelial corneal dystrophy, bilateral: Secondary | ICD-10-CM | POA: Diagnosis not present

## 2022-07-10 DIAGNOSIS — H0288A Meibomian gland dysfunction right eye, upper and lower eyelids: Secondary | ICD-10-CM | POA: Diagnosis not present

## 2022-07-13 DIAGNOSIS — I2510 Atherosclerotic heart disease of native coronary artery without angina pectoris: Secondary | ICD-10-CM | POA: Diagnosis not present

## 2022-07-13 DIAGNOSIS — H25811 Combined forms of age-related cataract, right eye: Secondary | ICD-10-CM | POA: Diagnosis not present

## 2022-07-13 DIAGNOSIS — F32A Depression, unspecified: Secondary | ICD-10-CM | POA: Diagnosis not present

## 2022-07-13 DIAGNOSIS — Z7901 Long term (current) use of anticoagulants: Secondary | ICD-10-CM | POA: Diagnosis not present

## 2022-07-13 DIAGNOSIS — G4733 Obstructive sleep apnea (adult) (pediatric): Secondary | ICD-10-CM | POA: Diagnosis not present

## 2022-07-13 DIAGNOSIS — I4891 Unspecified atrial fibrillation: Secondary | ICD-10-CM | POA: Diagnosis not present

## 2022-07-13 DIAGNOSIS — Z961 Presence of intraocular lens: Secondary | ICD-10-CM | POA: Diagnosis not present

## 2022-07-13 DIAGNOSIS — Z79899 Other long term (current) drug therapy: Secondary | ICD-10-CM | POA: Diagnosis not present

## 2022-07-13 DIAGNOSIS — E785 Hyperlipidemia, unspecified: Secondary | ICD-10-CM | POA: Diagnosis not present

## 2022-07-13 DIAGNOSIS — H401111 Primary open-angle glaucoma, right eye, mild stage: Secondary | ICD-10-CM | POA: Diagnosis not present

## 2022-07-13 DIAGNOSIS — I10 Essential (primary) hypertension: Secondary | ICD-10-CM | POA: Diagnosis not present

## 2022-07-14 DIAGNOSIS — H25812 Combined forms of age-related cataract, left eye: Secondary | ICD-10-CM | POA: Diagnosis not present

## 2022-07-18 DIAGNOSIS — Z03818 Encounter for observation for suspected exposure to other biological agents ruled out: Secondary | ICD-10-CM | POA: Diagnosis not present

## 2022-07-18 DIAGNOSIS — J029 Acute pharyngitis, unspecified: Secondary | ICD-10-CM | POA: Diagnosis not present

## 2022-07-31 ENCOUNTER — Other Ambulatory Visit: Payer: Self-pay | Admitting: Internal Medicine

## 2022-07-31 ENCOUNTER — Ambulatory Visit
Admission: RE | Admit: 2022-07-31 | Discharge: 2022-07-31 | Disposition: A | Discharge status: Home / Self Care | Payer: Medicare Other | Source: Ambulatory Visit | Attending: Internal Medicine | Admitting: Internal Medicine

## 2022-07-31 DIAGNOSIS — R059 Cough, unspecified: Secondary | ICD-10-CM | POA: Diagnosis not present

## 2022-07-31 DIAGNOSIS — R058 Other specified cough: Secondary | ICD-10-CM

## 2022-07-31 DIAGNOSIS — R062 Wheezing: Secondary | ICD-10-CM | POA: Diagnosis not present

## 2022-07-31 DIAGNOSIS — B349 Viral infection, unspecified: Secondary | ICD-10-CM | POA: Diagnosis not present

## 2022-08-11 DIAGNOSIS — F900 Attention-deficit hyperactivity disorder, predominantly inattentive type: Secondary | ICD-10-CM | POA: Diagnosis not present

## 2022-08-11 DIAGNOSIS — F331 Major depressive disorder, recurrent, moderate: Secondary | ICD-10-CM | POA: Diagnosis not present

## 2022-08-22 ENCOUNTER — Telehealth: Payer: Self-pay | Admitting: Cardiology

## 2022-08-22 NOTE — Telephone Encounter (Addendum)
Pt informed Metoprolol Tartrate was d/c in 2019 d/t orthostatic hypotension. Pt appreciates the call and information.  Pt asking when he is scheduled to follow up with Dr. Marlou Porch.  Informed that a recall letter was sent out last year to arrange visit for Sept of 2023. Pt aware I will send this to scheduling team to arrange overdue follow up. Pt agreeable to plan.  Pt also wanted to send his regards to Dr. Marlou Porch.  He looks forward to seeing him soon. Forwarding to MD for his FYI.

## 2022-08-22 NOTE — Telephone Encounter (Signed)
Pt c/o medication issue:  1. Name of Medication: metoprolol tartrate  2. How are you currently taking this medication (dosage and times per day)?   3. Are you having a reaction (difficulty breathing--STAT)?   4. What is your medication issue? Patient called stating he recently switched pharmacies. He wants to know if he is supposed to be on metoprolol tartrate.  If it was discontinued he would like to know the reason why it was.

## 2022-08-24 DIAGNOSIS — N200 Calculus of kidney: Secondary | ICD-10-CM | POA: Diagnosis not present

## 2022-08-29 DIAGNOSIS — H401122 Primary open-angle glaucoma, left eye, moderate stage: Secondary | ICD-10-CM | POA: Diagnosis not present

## 2022-08-29 DIAGNOSIS — H401111 Primary open-angle glaucoma, right eye, mild stage: Secondary | ICD-10-CM | POA: Diagnosis not present

## 2022-08-29 DIAGNOSIS — Z7901 Long term (current) use of anticoagulants: Secondary | ICD-10-CM | POA: Diagnosis not present

## 2022-08-29 DIAGNOSIS — F32A Depression, unspecified: Secondary | ICD-10-CM | POA: Diagnosis not present

## 2022-08-29 DIAGNOSIS — Z885 Allergy status to narcotic agent status: Secondary | ICD-10-CM | POA: Diagnosis not present

## 2022-08-29 DIAGNOSIS — M1990 Unspecified osteoarthritis, unspecified site: Secondary | ICD-10-CM | POA: Diagnosis not present

## 2022-08-29 DIAGNOSIS — Z886 Allergy status to analgesic agent status: Secondary | ICD-10-CM | POA: Diagnosis not present

## 2022-08-29 DIAGNOSIS — Z79899 Other long term (current) drug therapy: Secondary | ICD-10-CM | POA: Diagnosis not present

## 2022-08-29 DIAGNOSIS — I4819 Other persistent atrial fibrillation: Secondary | ICD-10-CM | POA: Diagnosis not present

## 2022-08-29 DIAGNOSIS — G629 Polyneuropathy, unspecified: Secondary | ICD-10-CM | POA: Diagnosis not present

## 2022-08-29 DIAGNOSIS — Z888 Allergy status to other drugs, medicaments and biological substances status: Secondary | ICD-10-CM | POA: Diagnosis not present

## 2022-08-29 DIAGNOSIS — Z9884 Bariatric surgery status: Secondary | ICD-10-CM | POA: Diagnosis not present

## 2022-08-29 DIAGNOSIS — H25812 Combined forms of age-related cataract, left eye: Secondary | ICD-10-CM | POA: Diagnosis not present

## 2022-08-29 DIAGNOSIS — E7800 Pure hypercholesterolemia, unspecified: Secondary | ICD-10-CM | POA: Diagnosis not present

## 2022-08-29 DIAGNOSIS — G4700 Insomnia, unspecified: Secondary | ICD-10-CM | POA: Diagnosis not present

## 2022-08-30 DIAGNOSIS — H401122 Primary open-angle glaucoma, left eye, moderate stage: Secondary | ICD-10-CM | POA: Diagnosis not present

## 2022-09-11 ENCOUNTER — Ambulatory Visit: Payer: Medicare Other | Admitting: Podiatry

## 2022-09-15 DIAGNOSIS — R5383 Other fatigue: Secondary | ICD-10-CM | POA: Diagnosis not present

## 2022-09-15 DIAGNOSIS — R739 Hyperglycemia, unspecified: Secondary | ICD-10-CM | POA: Diagnosis not present

## 2022-09-15 DIAGNOSIS — I10 Essential (primary) hypertension: Secondary | ICD-10-CM | POA: Diagnosis not present

## 2022-09-15 DIAGNOSIS — Z9884 Bariatric surgery status: Secondary | ICD-10-CM | POA: Diagnosis not present

## 2022-09-15 DIAGNOSIS — F411 Generalized anxiety disorder: Secondary | ICD-10-CM | POA: Diagnosis not present

## 2022-09-15 DIAGNOSIS — I482 Chronic atrial fibrillation, unspecified: Secondary | ICD-10-CM | POA: Diagnosis not present

## 2022-09-15 DIAGNOSIS — L987 Excessive and redundant skin and subcutaneous tissue: Secondary | ICD-10-CM | POA: Diagnosis not present

## 2022-09-15 DIAGNOSIS — F331 Major depressive disorder, recurrent, moderate: Secondary | ICD-10-CM | POA: Diagnosis not present

## 2022-09-15 DIAGNOSIS — E7800 Pure hypercholesterolemia, unspecified: Secondary | ICD-10-CM | POA: Diagnosis not present

## 2022-09-18 ENCOUNTER — Ambulatory Visit: Payer: Medicare Other | Admitting: Podiatry

## 2022-09-22 ENCOUNTER — Ambulatory Visit (INDEPENDENT_AMBULATORY_CARE_PROVIDER_SITE_OTHER): Payer: Medicare Other | Admitting: Podiatry

## 2022-09-22 DIAGNOSIS — M79674 Pain in right toe(s): Secondary | ICD-10-CM | POA: Diagnosis not present

## 2022-09-22 DIAGNOSIS — Z7901 Long term (current) use of anticoagulants: Secondary | ICD-10-CM

## 2022-09-22 DIAGNOSIS — M79675 Pain in left toe(s): Secondary | ICD-10-CM | POA: Diagnosis not present

## 2022-09-22 DIAGNOSIS — B351 Tinea unguium: Secondary | ICD-10-CM | POA: Diagnosis not present

## 2022-09-22 MED ORDER — TAVABOROLE 5 % EX SOLN: 1.0000 [drp] | Freq: Every day | CUTANEOUS | 0 refills | Status: DC

## 2022-09-22 NOTE — Patient Instructions (Signed)
Tavaborole Topical Solution What is this medication? TAVABOROLE (ta va BO role) treats fungal infections of the nails. It belongs to a group of medications called antifungals. It will not treat infections caused by bacteria or viruses. This medicine may be used for other purposes; ask your health care provider or pharmacist if you have questions. COMMON BRAND NAME(S): KERYDIN What should I tell my care team before I take this medication? They need to know if you have any of these conditions: An unusual or allergic reaction to tavaborole, other medications, foods, dyes, or preservatives Pregnant or trying to get pregnant Breast-feeding How should I use this medication? This medication is for external use only. Do not take by mouth. Wash your hands before and after use. If you are treating your hands, only wash your hands before use. Do not get it in your eyes. If you do, rinse your eyes with plenty of cool tap water. Use it as directed on the prescription label. Do not use it more often than directed. Use the medication for the full course as directed by your care team, even if you think you are better. Do not stop using it unless your care team tells you to stop it early. Apply a thin film of the medication to the affected area. Talk to your care team about the use of this medication in children. While it may be prescribed for children as young as 6 years for selected conditions, precautions do apply. Overdosage: If you think you have taken too much of this medicine contact a poison control center or emergency room at once. NOTE: This medicine is only for you. Do not share this medicine with others. What if I miss a dose? If you miss a dose, use it as soon as you can. If it is almost time for your next dose, use only that dose. Do not use double or extra doses. What may interact with this medication? Interactions have not been studied. Do not use any other nail products (i.e., nail polish,  pedicures) during treatment with this medication. This list may not describe all possible interactions. Give your health care provider a list of all the medicines, herbs, non-prescription drugs, or dietary supplements you use. Also tell them if you smoke, drink alcohol, or use illegal drugs. Some items may interact with your medicine. What should I watch for while using this medication? Visit your care team for regular checks on your progress. It may be some time before you see the benefit from this medication. After bathing, make sure your skin is very dry. Fungal infections like moist conditions. Do not walk around barefoot. To help prevent reinfection, wear freshly washed cotton, not synthetic, clothing. Tell your care team if you develop sores or blisters that do not heal properly. If your skin infection returns after you stop using this medication, contact your care team. What side effects may I notice from receiving this medication? Side effects that you should report to your care team as soon as possible: Allergic reactions--skin rash, itching, hives, swelling of the face, lips, tongue, or throat Burning, itching, crusting, or peeling of treated skin Side effects that usually do not require medical attention (report to your care team if they continue or are bothersome): Ingrown nails Mild skin irritation, redness, or dryness This list may not describe all possible side effects. Call your doctor for medical advice about side effects. You may report side effects to FDA at 1-800-FDA-1088. Where should I keep my medication? Keep out  of the reach of children and pets. Store at room temperature between 20 and 25 degrees C (68 and 77 degrees F). Keep this medication in the original container. Protect from moisture. Keep the container tightly closed. Avoid exposure to extreme heat. Get rid of any unused medication 3 months after opening. This medication is flammable. Avoid exposure to heat, fire,  flame, and smoking. To get rid of medications that are no longer needed or have expired: Take the medications to a medication take-back program. Check with your pharmacy or law enforcement to find a location. If you cannot return the medication, check the label or package insert to see if the medication should be thrown out in the garbage or flushed down the toilet. If you are not sure, ask your care team. If it is safe to put it in the trash, empty the medication out of the container. Mix the medication with cat litter, dirt, coffee grounds, or other unwanted substance. Seal the mixture in a bag or container. Put it in the trash. NOTE: This sheet is a summary. It may not cover all possible information. If you have questions about this medicine, talk to your doctor, pharmacist, or health care provider.  2023 Elsevier/Gold Standard (2021-10-24 00:00:00)

## 2022-09-22 NOTE — Progress Notes (Signed)
Subjective: No chief complaint on file.   72 y.o. returns the office today for painful, elongated, thickened toenails which he cannot trim himself.  He is concerned about fungus mostly on his right big toenail.  He has not been taking gabapentin for neuropathy and is describing numbness to his feet but no other symptoms.  No open lesions.  Kathalene Frames, MD  Objective: AAO 3, NAD DP/PT pulses palpable, CRT less than 3 seconds Sensation decreased. Nails hypertrophic, dystrophic, elongated, brittle, discolored 10. There is tenderness overlying the nails 1-5 bilaterally. There is no surrounding erythema or drainage along the nail sites.  The right hallux nail appears to have more yellow discoloration.  The other toenails. No pain with calf compression, swelling, warmth, erythema.  Assessment: Patient presents with symptomatic onychomycosis, neuropathy- on Eliquis  Plan: -Treatment options including alternatives, risks, complications were discussed -Nails sharply debrided 10 without complication/bleeding. -Prescribed Kerydin for nail fungus.  If not able to get this covered we will switch to [place]. -Monitor for any signs or symptoms of worsening neuropathy. -Discussed daily foot inspection. If there are any changes, to call the office immediately.  -Follow-up in 3 months or sooner if any problems are to arise. In the meantime, encouraged to call the office with any questions, concerns, changes symptoms.  Celesta Gentile, DPM

## 2022-09-25 DIAGNOSIS — J310 Chronic rhinitis: Secondary | ICD-10-CM | POA: Diagnosis not present

## 2022-09-25 DIAGNOSIS — J342 Deviated nasal septum: Secondary | ICD-10-CM | POA: Diagnosis not present

## 2022-09-25 DIAGNOSIS — J343 Hypertrophy of nasal turbinates: Secondary | ICD-10-CM | POA: Diagnosis not present

## 2022-09-26 ENCOUNTER — Other Ambulatory Visit: Payer: Self-pay | Admitting: Otolaryngology

## 2022-09-26 DIAGNOSIS — J329 Chronic sinusitis, unspecified: Secondary | ICD-10-CM

## 2022-09-27 ENCOUNTER — Ambulatory Visit
Admission: RE | Admit: 2022-09-27 | Discharge: 2022-09-27 | Disposition: A | Discharge status: Home / Self Care | Payer: Medicare Other | Source: Ambulatory Visit | Attending: Otolaryngology | Admitting: Otolaryngology

## 2022-09-27 DIAGNOSIS — J323 Chronic sphenoidal sinusitis: Secondary | ICD-10-CM | POA: Diagnosis not present

## 2022-09-27 DIAGNOSIS — K089 Disorder of teeth and supporting structures, unspecified: Secondary | ICD-10-CM | POA: Diagnosis not present

## 2022-09-27 DIAGNOSIS — J329 Chronic sinusitis, unspecified: Secondary | ICD-10-CM

## 2022-09-27 DIAGNOSIS — K056 Periodontal disease, unspecified: Secondary | ICD-10-CM | POA: Diagnosis not present

## 2022-09-27 DIAGNOSIS — J322 Chronic ethmoidal sinusitis: Secondary | ICD-10-CM | POA: Diagnosis not present

## 2022-09-28 DIAGNOSIS — I4821 Permanent atrial fibrillation: Secondary | ICD-10-CM | POA: Diagnosis not present

## 2022-09-29 DIAGNOSIS — F411 Generalized anxiety disorder: Secondary | ICD-10-CM | POA: Diagnosis not present

## 2022-09-29 DIAGNOSIS — F331 Major depressive disorder, recurrent, moderate: Secondary | ICD-10-CM | POA: Diagnosis not present

## 2022-09-29 DIAGNOSIS — I482 Chronic atrial fibrillation, unspecified: Secondary | ICD-10-CM | POA: Diagnosis not present

## 2022-10-02 DIAGNOSIS — H43813 Vitreous degeneration, bilateral: Secondary | ICD-10-CM | POA: Diagnosis not present

## 2022-10-02 DIAGNOSIS — H401131 Primary open-angle glaucoma, bilateral, mild stage: Secondary | ICD-10-CM | POA: Diagnosis not present

## 2022-10-02 DIAGNOSIS — H353132 Nonexudative age-related macular degeneration, bilateral, intermediate dry stage: Secondary | ICD-10-CM | POA: Diagnosis not present

## 2022-10-02 DIAGNOSIS — H35433 Paving stone degeneration of retina, bilateral: Secondary | ICD-10-CM | POA: Diagnosis not present

## 2022-10-04 DIAGNOSIS — N200 Calculus of kidney: Secondary | ICD-10-CM | POA: Diagnosis not present

## 2022-10-09 DIAGNOSIS — J343 Hypertrophy of nasal turbinates: Secondary | ICD-10-CM | POA: Diagnosis not present

## 2022-10-09 DIAGNOSIS — J310 Chronic rhinitis: Secondary | ICD-10-CM | POA: Diagnosis not present

## 2022-10-09 DIAGNOSIS — J342 Deviated nasal septum: Secondary | ICD-10-CM | POA: Diagnosis not present

## 2022-10-10 ENCOUNTER — Telehealth: Payer: Self-pay | Admitting: *Deleted

## 2022-10-10 NOTE — Telephone Encounter (Signed)
Patient with diagnosis of afib on Eliquis for anticoagulation.    Procedure: SEPTOPLASTY, TURBINATE REDUCTION  Date of procedure: TBD   CHA2DS2-VASc Score = 2   This indicates a 2.2% annual risk of stroke. The patient's score is based upon: CHF History: 0 HTN History: 0 Diabetes History: 0 Stroke History: 0 Vascular Disease History: 1 Age Score: 1 Gender Score: 0      CrCl 102.6 ml/min  Per office protocol, patient can hold Eliquis for 2 days prior to procedure.    **This guidance is not considered finalized until pre-operative APP has relayed final recommendations.**

## 2022-10-10 NOTE — Telephone Encounter (Signed)
   Name: Francisco Padilla  DOB: 03/01/1951  MRN: [id number]  Primary Cardiologist: Candee Furbish, MD  Chart reviewed as part of pre-operative protocol coverage. Because of Francisco Padilla's past medical history and time since last visit, he will require a follow-up in-office visit in order to better assess preoperative cardiovascular risk. Patient is scheduled for 11/03/2022 with Dr. Angelena Form.  I have updated the appointment notes.  Pre-op covering staff:  - Please contact requesting surgeon's office via preferred method (i.e, phone, fax) to inform them of need for appointment prior to surgery.  This message will also be routed to pharmacy pool for input on holding Eliquis as requested below so that this information is available to the clearing provider at time of patient's appointment.   Mayra Reel, NP  10/10/2022, 4:34 PM

## 2022-10-10 NOTE — Telephone Encounter (Signed)
   Pre-operative Risk Assessment    Patient Name: Francisco Padilla  DOB: 1950-12-29 MRN: [id number]      Request for Surgical Clearance    Procedure:   SEPTOPLASTY, TURBINATE REDUCTION  Date of Surgery:  Clearance TBD                                 Surgeon:  DR. Benjamine Mola Surgeon's Group or Practice Name:  [facility] [place] , [place] Phone number:  [phone] Fax number:  [phone]   Type of Clearance Requested:   - Medical  - Pharmacy:  Hold Apixaban (Eliquis)     Type of Anesthesia:  General    Additional requests/questions:    [name]   10/10/2022, 1:21 PM

## 2022-10-16 DIAGNOSIS — F411 Generalized anxiety disorder: Secondary | ICD-10-CM | POA: Diagnosis not present

## 2022-10-16 DIAGNOSIS — F331 Major depressive disorder, recurrent, moderate: Secondary | ICD-10-CM | POA: Diagnosis not present

## 2022-10-20 DIAGNOSIS — F331 Major depressive disorder, recurrent, moderate: Secondary | ICD-10-CM | POA: Diagnosis not present

## 2022-10-20 DIAGNOSIS — F411 Generalized anxiety disorder: Secondary | ICD-10-CM | POA: Diagnosis not present

## 2022-10-23 ENCOUNTER — Ambulatory Visit: Payer: Medicare Other | Admitting: Cardiology

## 2022-10-26 DIAGNOSIS — G5701 Lesion of sciatic nerve, right lower limb: Secondary | ICD-10-CM | POA: Diagnosis not present

## 2022-10-31 ENCOUNTER — Other Ambulatory Visit: Payer: Self-pay | Admitting: Otolaryngology

## 2022-10-31 DIAGNOSIS — F411 Generalized anxiety disorder: Secondary | ICD-10-CM | POA: Diagnosis not present

## 2022-10-31 DIAGNOSIS — F332 Major depressive disorder, recurrent severe without psychotic features: Secondary | ICD-10-CM | POA: Diagnosis not present

## 2022-11-02 ENCOUNTER — Other Ambulatory Visit: Payer: Self-pay | Admitting: Urology

## 2022-11-03 ENCOUNTER — Ambulatory Visit: Payer: Medicare Other | Attending: Cardiology | Admitting: Cardiology

## 2022-11-03 ENCOUNTER — Encounter: Payer: Self-pay | Admitting: Cardiology

## 2022-11-03 VITALS — BP 112/80 | HR 74 | Ht 75.0 in | Wt 212.0 lb

## 2022-11-03 DIAGNOSIS — I482 Chronic atrial fibrillation, unspecified: Secondary | ICD-10-CM | POA: Insufficient documentation

## 2022-11-03 DIAGNOSIS — I4811 Longstanding persistent atrial fibrillation: Secondary | ICD-10-CM | POA: Insufficient documentation

## 2022-11-03 NOTE — Patient Instructions (Signed)
Medication Instructions:  The current medical regimen is effective;  continue present plan and medications.  *If you need a refill on your cardiac medications before your next appointment, please call your pharmacy*  Follow-Up: At [facility], you and your health needs are our priority.  As part of our continuing mission to provide you with exceptional heart care, we have created designated Provider Care Teams.  These Care Teams include your primary Cardiologist (physician) and Advanced Practice Providers (APPs -  Physician Assistants and Nurse Practitioners) who all work together to provide you with the care you need, when you need it.  We recommend signing up for the patient portal called "MyChart".  Sign up information is provided on this After Visit Summary.  MyChart is used to connect with patients for Virtual Visits (Telemedicine).  Patients are able to view lab/test results, encounter notes, upcoming appointments, etc.  Non-urgent messages can be sent to your provider as well.   To learn more about what you can do with MyChart, go to [email or web address].    Your next appointment:   1 year(s)  Provider:   Mark Skains, MD      

## 2022-11-03 NOTE — Progress Notes (Signed)
Cardiology Office Note:    Date:  11/03/2022   ID:  Francisco Padilla, DOB 09/03/1950, MRN [id number]  PCP:  Francisco Aspen, MD  [facility] Cardiologist:  Francisco Schultz, MD  [facility] Electrophysiologist:  None   Referring MD: Francisco Padilla, *   History of Present Illness:    Francisco Padilla is a 72 y.o. male here for follow-up atrial fibrillation aortic stenosis coronary artery disease hypertension as well as preoperative risk evaluation.    Has prior GI bleed Gastric bypass 2008 Obstructive sleep apnea Vein stripping Saw Dr. Sudie Padilla at [facility]. Started Eliquis. Dr. Isabell Padilla as well.  Continuing with rate control.  Off of metoprolol.  Doing well.  No bleeding.  Syncope secondary to hypotension, on low-dose fludrocortisone.  Liberalize salt. He had not had any major episodes in quite some time.  Unfortunately, in September 2022 his husband, Francisco Padilla, passed away.  He shared with me his obituary.  He reports his left LE is still larger than his right due to edema.  He has seen Francisco Padilla with vascular.  Prior to that he was seeing Francisco Padilla.  For exercise he has been cycling regularly. He joined the [employer] this past June and is enjoying his workouts. Occasionally he feels an ache in his chest that he does not attribute to exercise. It may be due to stress.  Since his bariatric surgery, he has noticed that his abdominal skin has been increasingly less elastic and loose.  He may proceed with nasal septal surgery.  Past Medical History:  Diagnosis Date   ADD (attention deficit disorder)    Anxiety 2007   Asthma as child   Asymptomatic gallstones    Atrial fibrillation    Atrial flutter    Depression    DJD (degenerative joint disease) of knee    Dysrhythmia    Chronic Atrial Fib- seeing Dr. Shela Padilla [facility]   H/O peptic ulcer    past history with GI bleed- cauterization done throught scope   History of kidney stones    pt claims urology discovered a  kidney stone last week 05/25/21   Hypercholesterolemia    Orthostatic hypotension    Pneumonia as child   Pneumothorax on left 05/20/2009   Prediabetes    none now   Pulmonary nodules    Sleep apnea    cpap set on 13   Thrombocytopenia    pt denies   Thyromegaly    Varicose veins     Past Surgical History:  Procedure Laterality Date   BIOPSY  04/18/2018   Procedure: BIOPSY;  Surgeon: Francisco Salen, MD;  Location: [facility];  Service: Gastroenterology;;   ESOPHAGOGASTRODUODENOSCOPY N/A 04/18/2018   Procedure: ESOPHAGOGASTRODUODENOSCOPY (EGD);  Surgeon: Francisco Salen, MD;  Location: [facility];  Service: Gastroenterology;  Laterality: N/A;   ESOPHAGOGASTRODUODENOSCOPY (EGD) WITH PROPOFOL N/A 01/05/2015   Procedure: ESOPHAGOGASTRODUODENOSCOPY (EGD) WITH PROPOFOL;  Surgeon: Francisco Bumpers, MD;  Location: [facility];  Service: Endoscopy;  Laterality: N/A;   EXTRACORPOREAL SHOCK WAVE LITHOTRIPSY Left 08/11/2021   Procedure: LEFT EXTRACORPOREAL SHOCK WAVE LITHOTRIPSY (ESWL);  Surgeon: Francisco Hick, MD;  Location: [facility] [place];  Service: Urology;  Laterality: Left;   GASTRIC ROUX-EN-Y     '08-[name] (weight stable around 302 #   KNEE ARTHROSCOPY     TONSILLECTOMY     TOTAL KNEE ARTHROPLASTY Left 06/03/2021   Procedure: LEFT TOTAL KNEE ARTHROPLASTY;  Surgeon: Francisco Mustard, MD;  Location: [facility] OR;  Service:  Orthopedics;  Laterality: Left;   ULNAR NERVE TRANSPOSITION Right 15 yrs ago    Current Medications: Current Meds  Medication Sig   acetaminophen (TYLENOL) 650 MG CR tablet Take 1,300 mg by mouth every 8 (eight) hours as needed for pain.   atorvastatin (LIPITOR) 20 MG tablet Take 20 mg by mouth at bedtime.   Cyanocobalamin (B-12 SL) Place 1 tablet under the tongue daily.   ELIQUIS 5 MG TABS tablet Take 5 mg by mouth 2 (two) times daily.    fludrocortisone (FLORINEF) 0.1 MG tablet Take 0.1 mg by mouth daily.   gabapentin (NEURONTIN) 100 MG capsule Take 4 capsules (400  mg total) by mouth at bedtime.   latanoprost (XALATAN) 0.005 % ophthalmic solution Place 1 drop into both eyes at bedtime.   LORazepam (ATIVAN) 1 MG tablet Take 0.5 mg by mouth every 4 (four) hours.   Multiple Vitamins-Minerals (ONE DAILY MULTIVITAMIN MEN) TABS Take 1 tablet by mouth 2 (two) times daily.   pantoprazole (PROTONIX) 40 MG tablet Take 80 mg by mouth at bedtime.    Polyethyl Glycol-Propyl Glycol (SYSTANE OP) Place 1 drop into both eyes daily as needed (dry eyes).   Probiotic Product (PROBIOTIC PO) Take 1 capsule by mouth daily.   SUPER B COMPLEX/C PO Take 1 tablet by mouth daily.   Tavaborole (KERYDIN) 5 % SOLN Apply 1 drop topically daily. Apply 1 drop to the toenail daily.   zolpidem (AMBIEN) 10 MG tablet Take 5-10 mg by mouth at bedtime.     Allergies:   Aspirin, Nsaids, and Oxycodone hcl   Social History   Socioeconomic History   Marital status: Single    Spouse name: Not on file   Number of children: Not on file   Years of education: Not on file   Highest education level: Not on file  Occupational History   Not on file  Tobacco Use   Smoking status: Never   Smokeless tobacco: Never  Vaping Use   Vaping Use: Never used  Substance and Sexual Activity   Alcohol use: No    Alcohol/week: 0.0 standard drinks of alcohol   Drug use: Never   Sexual activity: Yes  Other Topics Concern   Not on file  Social History Narrative   Not on file   Social Determinants of Health   Financial Resource Strain: Not on file  Food Insecurity: Not on file  Transportation Needs: Not on file  Physical Activity: Not on file  Stress: Not on file  Social Connections: Not on file     Family History: The patient's family history includes Heart attack in his father; Heart disease in his father and mother; Heart failure in his mother. There is no history of Stroke.  ROS:   Please see the history of present illness. (+) Left LE edema (+) Easy bleed All other systems are reviewed  and negative.    EKGs/Labs/Other Studies Reviewed:         LE Venous Reflux 07/01/2019: Summary:  Right: No evidence of DVT, SVT, or Baker's cyst.  The sapheno-femoral junction is in competent.  The AAGSV is incompetent and appears to give rise to the symptomatic  varicosities. The thigh segment of the GSV is non compressible in the  thigh but reconstitutes distally where it is incompetent.  No evidence of SSV reflux.  The common femoral vein is incompetent.  Left: No evidence of DVT, SVT, or Baker's cyst.  The sapheno-femoral junction is incompetent.  The GSV demonstrates reflux  from the [facility] into the calf.  No evidence of SSV reflux.  The common femoral vein demonstrates reflux.  Left LE Doppler 05/02/2019: IMPRESSION: Negative for deep venous thrombosis.   Greater saphenous vein containing echogenic material compatible with thrombus. Findings are suggestive of superficial thrombophlebitis.  EKG:  EKG is personally reviewed and interpreted.  11/03/2022-atrial fibrillation 74 no other abnormalities. 04/26/2021: Atrial fibrillation. Rate 76 bpm. 06/29/2020: atrial fibrillation 86 with  Recent Labs: No results found for requested labs within last 365 days.  Recent Lipid Panel No results found for: "CHOL", "TRIG", "HDL", "CHOLHDL", "VLDL", "LDLCALC", "LDLDIRECT"   Physical Exam:     VS:  BP 112/80 (BP Location: Left Arm, Patient Position: Sitting, Cuff Size: Normal)   Pulse 74   Ht 6\' 3"  (1.905 m)   Wt 212 lb (96.2 kg)   BMI 26.50 kg/m     Wt Readings from Last 3 Encounters:  11/03/22 212 lb (96.2 kg)  05/10/22 222 lb (100.7 kg)  09/07/21 223 lb 6.4 oz (101.3 kg)     GEN: Well nourished, well developed in no acute distress HEENT: Normal NECK: No JVD; No carotid bruits LYMPHATICS: No lymphadenopathy CARDIAC: Irregularly irregular, no murmurs, rubs, gallops RESPIRATORY:  Clear to auscultation without rales, wheezing or rhonchi  ABDOMEN: Soft, non-tender,  non-distended MUSCULOSKELETAL: Chronic lower extremity edema left greater than right, varicosities No deformity  SKIN: Warm and dry NEUROLOGIC:  Alert and oriented x 3 PSYCHIATRIC:  Normal affect   ASSESSMENT:    1. Chronic atrial fibrillation   2. Longstanding persistent atrial fibrillation      PLAN:    In order of problems listed above:   Longstanding persistent atrial fibrillation (HCC) Continue with good rate control.  He is not on an AV nodal blocking agent.  Heart rate today 76 bpm.  Overall doing well.  Continue with Eliquis.  Orthostatic hypotension Continue with low-dose fludrocortisone.  Prior potassium 3.9 creatinine 0.85.  Continue to liberalize salt intake, fluids.  Be very careful at the gym, [employer] when using the sauna.  He is felt a little bit "woozy ".  I would try to avoid.  Chronic anticoagulation Continue with Eliquis 5 mg twice a day.  Refills as needed for medical management chronic anticoagulation.  No bleeding complications.  He will obviously need to hold these medications prior to procedures for 2 days.  Preop cardiovascular exam He may proceed with nasal septal surgery with low overall cardiac risk.  Hold the Eliquis for 2 days prior to procedure and resume as soon as possible to help reduce overall stroke risk.  He is able to complete greater than 4 METS of activity without any difficulty.     1 year follow-up.  Medication Adjustments/Labs and Tests Ordered: Current medicines are reviewed at length with the patient today.  Concerns regarding medicines are outlined above.   Orders Placed This Encounter  Procedures   EKG 12-Lead    No orders of the defined types were placed in this encounter.   Patient Instructions  Medication Instructions:  The current medical regimen is effective;  continue present plan and medications.  *If you need a refill on your cardiac medications before your next appointment, please call your  pharmacy*  Follow-Up: At [facility], you and your health needs are our priority.  As part of our continuing mission to provide you with exceptional heart care, we have created designated Provider Care Teams.  These Care Teams include your primary Cardiologist (physician) and Advanced  Practice Providers (APPs -  Physician Assistants and Nurse Practitioners) who all work together to provide you with the care you need, when you need it.  We recommend signing up for the patient portal called "MyChart".  Sign up information is provided on this After Visit Summary.  MyChart is used to connect with patients for Virtual Visits (Telemedicine).  Patients are able to view lab/test results, encounter notes, upcoming appointments, etc.  Non-urgent messages can be sent to your provider as well.   To learn more about what you can do with MyChart, go to [email or web address].    Your next appointment:   1 year(s)  Provider:   Donato Schultz, MD        [facility],acting as a scribe for Francisco Schultz, MD.,have documented all relevant documentation on the behalf of Francisco Schultz, MD,as directed by  Francisco Schultz, MD while in the presence of Francisco Schultz, MD.  I, Francisco Schultz, MD, have reviewed all documentation for this visit. The documentation on 11/03/22 for the exam, diagnosis, procedures, and orders are all accurate and complete.   Signed, Francisco Schultz, MD  11/03/2022 3:55 PM    [facility]

## 2022-11-06 ENCOUNTER — Encounter (HOSPITAL_BASED_OUTPATIENT_CLINIC_OR_DEPARTMENT_OTHER): Payer: Self-pay | Admitting: Otolaryngology

## 2022-11-06 DIAGNOSIS — F332 Major depressive disorder, recurrent severe without psychotic features: Secondary | ICD-10-CM | POA: Diagnosis not present

## 2022-11-07 DIAGNOSIS — F332 Major depressive disorder, recurrent severe without psychotic features: Secondary | ICD-10-CM | POA: Diagnosis not present

## 2022-11-08 ENCOUNTER — Encounter: Payer: Self-pay | Admitting: Vascular Surgery

## 2022-11-08 ENCOUNTER — Ambulatory Visit (HOSPITAL_COMMUNITY)
Admission: RE | Admit: 2022-11-08 | Discharge: 2022-11-08 | Disposition: A | Discharge status: Home / Self Care | Payer: Medicare Other | Source: Ambulatory Visit | Attending: Vascular Surgery | Admitting: Vascular Surgery

## 2022-11-08 ENCOUNTER — Ambulatory Visit (INDEPENDENT_AMBULATORY_CARE_PROVIDER_SITE_OTHER): Payer: Medicare Other | Admitting: Vascular Surgery

## 2022-11-08 VITALS — BP 123/83 | HR 73 | Temp 98.1°F | Resp 20 | Ht 75.0 in | Wt 215.0 lb

## 2022-11-08 DIAGNOSIS — I83813 Varicose veins of bilateral lower extremities with pain: Secondary | ICD-10-CM | POA: Insufficient documentation

## 2022-11-08 DIAGNOSIS — I872 Venous insufficiency (chronic) (peripheral): Secondary | ICD-10-CM | POA: Diagnosis not present

## 2022-11-08 DIAGNOSIS — F332 Major depressive disorder, recurrent severe without psychotic features: Secondary | ICD-10-CM | POA: Diagnosis not present

## 2022-11-08 NOTE — Progress Notes (Signed)
Patient ID: Francisco Padilla, male   DOB: [birth year], 72 y.o.   MRN: [id number]  Reason for Consult: Follow-up   Referred by Emilio Aspen, *  Subjective:     HPI:  Francisco Padilla is a 72 y.o. male has a history of left greater saphenous vein ablation with no recanalization.  Continues to wear thigh-high compression stockings religiously.  He also has a previous left total knee replacement and has left leg swelling without right leg swelling.  He has multiple varicosities as aching of his legs at the site of the varicosities bilaterally.  He has not had any bleeding or clotting issues with his varicosities.  Past Medical History:  Diagnosis Date   ADD (attention deficit disorder)    Anxiety 2007   Asthma as child   Asymptomatic gallstones    Atrial fibrillation    Atrial flutter    Depression    DJD (degenerative joint disease) of knee    Dysrhythmia    Chronic Atrial Fib- seeing Dr. Shela [facility]   H/O peptic ulcer    past history with GI bleed- cauterization done throught scope   History of kidney stones    pt claims urology discovered a kidney stone last week 05/25/21   Hypercholesterolemia    Orthostatic hypotension    Pneumonia as child   Pneumothorax on left 05/20/2009   Prediabetes    none now   Pulmonary nodules    Sleep apnea    cpap set on 13   Thrombocytopenia    pt denies   Thyromegaly    Varicose veins    Family History  Problem Relation Age of Onset   Heart disease Mother    Heart failure Mother    Heart disease Father    Heart attack Father    Stroke Neg Hx    Past Surgical History:  Procedure Laterality Date   BIOPSY  04/18/2018   Procedure: BIOPSY;  Surgeon: Kerin Salen, MD;  Location: [facility];  Service: Gastroenterology;;   ESOPHAGOGASTRODUODENOSCOPY N/A 04/18/2018   Procedure: ESOPHAGOGASTRODUODENOSCOPY (EGD);  Surgeon: Kerin Salen, MD;  Location: [facility];  Service: Gastroenterology;  Laterality: N/A;    ESOPHAGOGASTRODUODENOSCOPY (EGD) WITH PROPOFOL N/A 01/05/2015   Procedure: ESOPHAGOGASTRODUODENOSCOPY (EGD) WITH PROPOFOL;  Surgeon: Charolett Bumpers, MD;  Location: [facility];  Service: Endoscopy;  Laterality: N/A;   EXTRACORPOREAL SHOCK WAVE LITHOTRIPSY Left 08/11/2021   Procedure: LEFT EXTRACORPOREAL SHOCK WAVE LITHOTRIPSY (ESWL);  Surgeon: Jannifer Hick, MD;  [facility];  Service: Urology;  Laterality: Left;   GASTRIC ROUX-EN-Y     '08-[name] (weight stable around 302 #   KNEE ARTHROSCOPY     TONSILLECTOMY     TOTAL KNEE ARTHROPLASTY Left 06/03/2021   Procedure: LEFT TOTAL KNEE ARTHROPLASTY;  Surgeon: Nadara Mustard, MD;  Location: [facility] OR;  Service: Orthopedics;  Laterality: Left;   ULNAR NERVE TRANSPOSITION Right 15 yrs ago    Short Social History:  Social History   Tobacco Use   Smoking status: Never   Smokeless tobacco: Never  Substance Use Topics   Alcohol use: No    Alcohol/week: 0.0 standard drinks of alcohol    Allergies  Allergen Reactions   Aspirin     Avoid due to bariatric surgery   Nsaids     Avoid due to bariatric surgery   Oxycodone Hcl     Hallucinations, agitation     Current Outpatient Medications  Medication Sig Dispense Refill   acetaminophen (  TYLENOL) 650 MG CR tablet Take 1,300 mg by mouth every 8 (eight) hours as needed for pain.     atorvastatin (LIPITOR) 20 MG tablet Take 20 mg by mouth at bedtime.     ELIQUIS 5 MG TABS tablet Take 5 mg by mouth 2 (two) times daily.   0   fludrocortisone (FLORINEF) 0.1 MG tablet Take 0.1 mg by mouth daily.     gabapentin (NEURONTIN) 100 MG capsule Take 4 capsules (400 mg total) by mouth at bedtime. 120 capsule 2   latanoprost (XALATAN) 0.005 % ophthalmic solution Place 1 drop into both eyes at bedtime.     LORazepam (ATIVAN) 1 MG tablet Take 0.5 mg by mouth every 4 (four) hours.     Multiple Vitamins-Minerals (ONE DAILY MULTIVITAMIN MEN) TABS Take 1 tablet by mouth 2 (two) times daily.      pantoprazole (PROTONIX) 40 MG tablet Take 80 mg by mouth at bedtime.      Polyethyl Glycol-Propyl Glycol (SYSTANE OP) Place 1 drop into both eyes daily as needed (dry eyes).     Probiotic Product (PROBIOTIC PO) Take 1 capsule by mouth daily.     SUPER B COMPLEX/C PO Take 1 tablet by mouth daily.     zolpidem (AMBIEN) 10 MG tablet Take 5-10 mg by mouth at bedtime.  0   No current facility-administered medications for this visit.    Review of Systems  Constitutional:  Constitutional negative. HENT: HENT negative.  Eyes: Eyes negative.  Cardiovascular: Positive for leg swelling.  GI: Gastrointestinal negative.  Musculoskeletal: Positive for leg pain.  Skin: Skin negative.  Neurological: Neurological negative. Hematologic: Hematologic/lymphatic negative.  Psychiatric: Psychiatric negative.        Objective:  Objective   Vitals:   11/08/22 1409  BP: 123/83  Pulse: 73  Resp: 20  Temp: 98.1 F (36.7 C)  SpO2: 96%  Weight: 215 lb (97.5 kg)  Height:  (1.905 m)   Body mass index is 26.87 kg/m.  Physical Exam HENT:     Head: Normocephalic.     Nose: Nose normal.     Mouth/Throat:     Mouth: Mucous membranes are moist.  Eyes:     Pupils: Pupils are equal, round, and reactive to light.  Cardiovascular:     Rate and Rhythm: Normal rate.     Pulses: Normal pulses.  Pulmonary:     Effort: Pulmonary effort is normal.  Abdominal:     General: Abdomen is flat.  Musculoskeletal:     Cervical back: Normal range of motion and neck supple.     Right lower leg: Edema present.     Left lower leg: Edema present.  Skin:    General: Skin is warm.     Capillary Refill: Capillary refill takes less than 2 seconds.  Neurological:     General: No focal deficit present.     Mental Status: He is alert.  Psychiatric:        Mood and Affect: Mood normal.        Behavior: Behavior normal.        Thought Content: Thought content normal.        Judgment: Judgment normal.           Data: Venous Reflux Times   RIGHT       Reflux NoRefluxReflux TimeDiameter cmsComments                         Yes                                   +-------------+---------+------+-----------+------------+--------+  CFV                   yes   >1 second                       +-------------+---------+------+-----------+------------+--------+  FV prox                yes   >1 second                       +-------------+---------+------+-----------+------------+--------+  FV mid       no                                              +-------------+---------+------+-----------+------------+--------+  FV dist                yes   >1 second                       +-------------+---------+------+-----------+------------+--------+  Popliteal   no                                              +-------------+---------+------+-----------+------------+--------+  GSV at SFJ             yes    >500 ms      0.54              +-------------+---------+------+-----------+------------+--------+  SSV Pop Fossano                           0.374              +-------------+---------+------+-----------+------------+--------+  SSV prox calfno                           0.379              +-------------+---------+------+-----------+------------+--------+  SSV mid calf no                           0.324              +-------------+---------+------+-----------+------------+--------+  AASV at [facility]  no                           0.477              +-------------+---------+------+-----------+------------+--------+  AASV prox              yes    >500 ms     0.585              +-------------+---------+------+-----------+------------+--------+      LEFT         Reflux NoRefluxReflux TimeDiameter cmsComments                                  Yes                                             +--------------+---------+------+-----------+------------+----------------+  CFV                    yes   >1 second                                +--------------+---------+------+-----------+------------+----------------+   FV prox                 yes   >1 second                                +--------------+---------+------+-----------+------------+----------------+   FV mid                  yes   >1 second                                +--------------+---------+------+-----------+------------+----------------+   FV dist                 yes   >1 second                                +--------------+---------+------+-----------+------------+----------------+   Popliteal              yes   >1 second                                +--------------+---------+------+-----------+------------+----------------+   GSV at [facility]              yes    >500 ms      1.02                       +--------------+---------+------+-----------+------------+----------------+   GSV prox thigh          yes    >500 ms     0.869                       +--------------+---------+------+-----------+------------+----------------+   GSV mid thigh           yes    >500 ms      1.16                       +--------------+---------+------+-----------+------------+----------------+   GSV dist thigh          yes    >500 ms      1.06    chronic  thrombus  +--------------+---------+------+-----------+------------+----------------+   GSV at knee             yes    >500 ms     0.741                       +--------------+---------+------+-----------+------------+----------------+   GSV prox calf           yes    >500 ms     0.869                       +--------------+---------+------+-----------+------------+----------------+   SSV Pop Fossa no                           0.335                        +--------------+---------+------+-----------+------------+----------------+  SSV prox calf no                           0.493                       +--------------+---------+------+-----------+------------+----------------+   SSV mid calf  no                           0.456                       +--------------+---------+------+-----------+------------+----------------+         Summary:  Right:  - No evidence of deep vein thrombosis seen in the right lower extremity,  from the common femoral through the popliteal veins.  - No evidence of superficial venous thrombosis in the right lower  extremity.   - Postive deep vein reflux.   - Positve superficial vein reflux.    Left:  - No evidence of deep vein thrombosis seen in the left lower extremity,  from the common femoral through the popliteal veins.  - No evidence of superficial venous thrombosis in the left lower  extremity.    - Positive deep vein reflux   -Positive superficial vein reflux.      Assessment/Plan:    72 year old male with left lower extremity symptomatic C3 venous disease with large refluxing great saphenous vein and on the right he has symptomatic C2 venous disease with a large refluxing anterior accessory saphenous vein is present for approximately 15 cm.  We evaluated both with ultrasound at bedside today and the great saphenous vein on the left is within the fascia throughout the upper thigh we discussed great saphenous vein ablation on the left with greater than 20 stab phlebectomies.  We discussed the expected results as well as the risk benefits alternatives he demonstrates good understanding.  After the left side we can consider performing an anterior accessory saphenous vein ablation on the right with again greater than 20 stab phlebectomy.  He will continue to wear compression stockings.     Maeola Harman MD [facility]

## 2022-11-09 DIAGNOSIS — F332 Major depressive disorder, recurrent severe without psychotic features: Secondary | ICD-10-CM | POA: Diagnosis not present

## 2022-11-10 DIAGNOSIS — F332 Major depressive disorder, recurrent severe without psychotic features: Secondary | ICD-10-CM | POA: Diagnosis not present

## 2022-11-13 ENCOUNTER — Ambulatory Visit (HOSPITAL_BASED_OUTPATIENT_CLINIC_OR_DEPARTMENT_OTHER)
Admission: RE | Admit: 2022-11-13 | Discharge: 2022-11-13 | Disposition: A | Discharge status: Home / Self Care | Payer: Medicare Other | Source: Ambulatory Visit | Attending: Otolaryngology | Admitting: Otolaryngology

## 2022-11-13 ENCOUNTER — Ambulatory Visit (HOSPITAL_BASED_OUTPATIENT_CLINIC_OR_DEPARTMENT_OTHER): Payer: Medicare Other | Admitting: Anesthesiology

## 2022-11-13 ENCOUNTER — Encounter (HOSPITAL_BASED_OUTPATIENT_CLINIC_OR_DEPARTMENT_OTHER): Payer: Self-pay | Admitting: Otolaryngology

## 2022-11-13 ENCOUNTER — Encounter (HOSPITAL_BASED_OUTPATIENT_CLINIC_OR_DEPARTMENT_OTHER)
Admission: RE | Disposition: A | Discharge status: Home / Self Care | Payer: Self-pay | Source: Ambulatory Visit | Attending: Otolaryngology

## 2022-11-13 ENCOUNTER — Other Ambulatory Visit: Payer: Self-pay

## 2022-11-13 DIAGNOSIS — Z9989 Dependence on other enabling machines and devices: Secondary | ICD-10-CM

## 2022-11-13 DIAGNOSIS — J310 Chronic rhinitis: Secondary | ICD-10-CM | POA: Diagnosis not present

## 2022-11-13 DIAGNOSIS — J342 Deviated nasal septum: Secondary | ICD-10-CM | POA: Insufficient documentation

## 2022-11-13 DIAGNOSIS — R7303 Prediabetes: Secondary | ICD-10-CM | POA: Diagnosis not present

## 2022-11-13 DIAGNOSIS — I4891 Unspecified atrial fibrillation: Secondary | ICD-10-CM | POA: Insufficient documentation

## 2022-11-13 DIAGNOSIS — J343 Hypertrophy of nasal turbinates: Secondary | ICD-10-CM | POA: Insufficient documentation

## 2022-11-13 DIAGNOSIS — J3489 Other specified disorders of nose and nasal sinuses: Secondary | ICD-10-CM | POA: Insufficient documentation

## 2022-11-13 DIAGNOSIS — G4730 Sleep apnea, unspecified: Secondary | ICD-10-CM | POA: Insufficient documentation

## 2022-11-13 DIAGNOSIS — G4733 Obstructive sleep apnea (adult) (pediatric): Secondary | ICD-10-CM

## 2022-11-13 DIAGNOSIS — F418 Other specified anxiety disorders: Secondary | ICD-10-CM | POA: Diagnosis not present

## 2022-11-13 DIAGNOSIS — I2510 Atherosclerotic heart disease of native coronary artery without angina pectoris: Secondary | ICD-10-CM | POA: Insufficient documentation

## 2022-11-13 DIAGNOSIS — Z8711 Personal history of peptic ulcer disease: Secondary | ICD-10-CM | POA: Insufficient documentation

## 2022-11-13 DIAGNOSIS — J45909 Unspecified asthma, uncomplicated: Secondary | ICD-10-CM | POA: Diagnosis not present

## 2022-11-13 HISTORY — PX: NASAL SEPTOPLASTY W/ TURBINOPLASTY: SHX2070

## 2022-11-13 SURGERY — SEPTOPLASTY, NOSE, WITH NASAL TURBINATE REDUCTION
Anesthesia: General | Site: Nose | Laterality: Bilateral

## 2022-11-13 MED ORDER — ONDANSETRON HCL 4 MG/2ML IJ SOLN: 4.0000 mg | Freq: Once | INTRAMUSCULAR | Status: DC | PRN

## 2022-11-13 MED ORDER — OXYMETAZOLINE HCL 0.05 % NA SOLN
NASAL | Status: DC | PRN
  Administered 2022-11-13: 1 via TOPICAL

## 2022-11-13 MED ORDER — LIDOCAINE-EPINEPHRINE 1 %-1:100000 IJ SOLN
INTRAMUSCULAR | Status: DC | PRN
  Administered 2022-11-13: 4.5 mL

## 2022-11-13 MED ORDER — ONDANSETRON HCL 4 MG/2ML IJ SOLN
INTRAMUSCULAR | Status: AC
  Filled 2022-11-13: qty 2

## 2022-11-13 MED ORDER — MUPIROCIN 2 % EX OINT
TOPICAL_OINTMENT | CUTANEOUS | Status: DC | PRN
  Administered 2022-11-13: 1 via NASAL

## 2022-11-13 MED ORDER — LACTATED RINGERS IV SOLN: INTRAVENOUS | Status: DC

## 2022-11-13 MED ORDER — DEXAMETHASONE SODIUM PHOSPHATE 10 MG/ML IJ SOLN
INTRAMUSCULAR | Status: AC
  Filled 2022-11-13: qty 1

## 2022-11-13 MED ORDER — MUPIROCIN 2 % EX OINT
TOPICAL_OINTMENT | CUTANEOUS | Status: AC
  Filled 2022-11-13: qty 22

## 2022-11-13 MED ORDER — PHENYLEPHRINE HCL (PRESSORS) 10 MG/ML IV SOLN
INTRAVENOUS | Status: AC
  Filled 2022-11-13: qty 1

## 2022-11-13 MED ORDER — ONDANSETRON HCL 4 MG/2ML IJ SOLN
INTRAMUSCULAR | Status: DC | PRN
  Administered 2022-11-13: 4 mg via INTRAVENOUS

## 2022-11-13 MED ORDER — HYDROCODONE-ACETAMINOPHEN 7.5-325 MG PO TABS: 1.0000 | ORAL_TABLET | Freq: Once | ORAL | Status: DC | PRN

## 2022-11-13 MED ORDER — ROCURONIUM BROMIDE 10 MG/ML (PF) SYRINGE
PREFILLED_SYRINGE | INTRAVENOUS | Status: AC
  Filled 2022-11-13: qty 10

## 2022-11-13 MED ORDER — FENTANYL CITRATE (PF) 250 MCG/5ML IJ SOLN
INTRAMUSCULAR | Status: DC | PRN
  Administered 2022-11-13: 100 ug via INTRAVENOUS

## 2022-11-13 MED ORDER — LIDOCAINE 2% (20 MG/ML) 5 ML SYRINGE
INTRAMUSCULAR | Status: DC | PRN
  Administered 2022-11-13: 60 mg via INTRAVENOUS

## 2022-11-13 MED ORDER — AMOXICILLIN 875 MG PO TABS: 875.0000 mg | ORAL_TABLET | Freq: Two times a day (BID) | ORAL | 0 refills | Status: AC

## 2022-11-13 MED ORDER — FENTANYL CITRATE (PF) 100 MCG/2ML IJ SOLN
25.0000 ug | INTRAMUSCULAR | Status: DC | PRN
  Administered 2022-11-13: 50 ug via INTRAVENOUS

## 2022-11-13 MED ORDER — LIDOCAINE 2% (20 MG/ML) 5 ML SYRINGE
INTRAMUSCULAR | Status: AC
  Filled 2022-11-13: qty 5

## 2022-11-13 MED ORDER — DEXAMETHASONE SODIUM PHOSPHATE 10 MG/ML IJ SOLN
INTRAMUSCULAR | Status: DC | PRN
  Administered 2022-11-13: 10 mg via INTRAVENOUS

## 2022-11-13 MED ORDER — EPHEDRINE SULFATE-NACL 50-0.9 MG/10ML-% IV SOSY
PREFILLED_SYRINGE | INTRAVENOUS | Status: DC | PRN
  Administered 2022-11-13: 10 mg via INTRAVENOUS
  Administered 2022-11-13: 5 mg via INTRAVENOUS
  Administered 2022-11-13: 10 mg via INTRAVENOUS

## 2022-11-13 MED ORDER — CEFAZOLIN SODIUM-DEXTROSE 2-3 GM-%(50ML) IV SOLR
INTRAVENOUS | Status: DC | PRN
  Administered 2022-11-13: 2 g via INTRAVENOUS

## 2022-11-13 MED ORDER — FENTANYL CITRATE (PF) 100 MCG/2ML IJ SOLN
INTRAMUSCULAR | Status: AC
  Filled 2022-11-13: qty 2

## 2022-11-13 MED ORDER — PHENYLEPHRINE HCL-NACL 20-0.9 MG/250ML-% IV SOLN
INTRAVENOUS | Status: DC | PRN
  Administered 2022-11-13: 40 ug/min via INTRAVENOUS

## 2022-11-13 MED ORDER — SUGAMMADEX SODIUM 200 MG/2ML IV SOLN
INTRAVENOUS | Status: DC | PRN
  Administered 2022-11-13: 200 mg via INTRAVENOUS

## 2022-11-13 MED ORDER — PROPOFOL 10 MG/ML IV BOLUS
INTRAVENOUS | Status: AC
  Filled 2022-11-13: qty 20

## 2022-11-13 MED ORDER — PROPOFOL 10 MG/ML IV BOLUS
INTRAVENOUS | Status: DC | PRN
  Administered 2022-11-13: 150 mg via INTRAVENOUS

## 2022-11-13 MED ORDER — PHENYLEPHRINE 80 MCG/ML (10ML) SYRINGE FOR IV PUSH (FOR BLOOD PRESSURE SUPPORT)
PREFILLED_SYRINGE | INTRAVENOUS | Status: DC | PRN
  Administered 2022-11-13 (×4): 160 ug via INTRAVENOUS
  Administered 2022-11-13: 80 ug via INTRAVENOUS
  Administered 2022-11-13 (×2): 160 ug via INTRAVENOUS
  Administered 2022-11-13 (×2): 80 ug via INTRAVENOUS
  Administered 2022-11-13: 160 ug via INTRAVENOUS

## 2022-11-13 MED ORDER — HYDROCODONE-ACETAMINOPHEN 5-325 MG PO TABS: 1.0000 | ORAL_TABLET | Freq: Four times a day (QID) | ORAL | 0 refills | Status: AC | PRN

## 2022-11-13 MED ORDER — ROCURONIUM BROMIDE 10 MG/ML (PF) SYRINGE
PREFILLED_SYRINGE | INTRAVENOUS | Status: DC | PRN
  Administered 2022-11-13: 70 mg via INTRAVENOUS

## 2022-11-13 SURGICAL SUPPLY — 30 items
ATTRACTOMAT 16X20 MAGNETIC DRP (DRAPES) IMPLANT
CANISTER SUCT 1200ML W/VALVE (MISCELLANEOUS) ×1 IMPLANT
COAGULATOR SUCT 8FR VV (MISCELLANEOUS) ×1 IMPLANT
DEFOGGER MIRROR 1QT (MISCELLANEOUS) ×1 IMPLANT
DRSG NASOPORE 8CM (GAUZE/BANDAGES/DRESSINGS) IMPLANT
DRSG TELFA 3X8 NADH STRL (GAUZE/BANDAGES/DRESSINGS) IMPLANT
ELECT REM PT RETURN 9FT ADLT (ELECTROSURGICAL) ×1
ELECTRODE REM PT RTRN 9FT ADLT (ELECTROSURGICAL) ×1 IMPLANT
GAUZE SPONGE 2X2 STRL 8-PLY (GAUZE/BANDAGES/DRESSINGS) ×1 IMPLANT
GLOVE BIO SURGEON STRL SZ7.5 (GLOVE) ×1 IMPLANT
GOWN STRL REUS W/ TWL LRG LVL3 (GOWN DISPOSABLE) ×2 IMPLANT
GOWN STRL REUS W/TWL LRG LVL3 (GOWN DISPOSABLE) ×2
NDL HYPO 25X1 1.5 SAFETY (NEEDLE) ×1 IMPLANT
NEEDLE HYPO 25X1 1.5 SAFETY (NEEDLE) ×1 IMPLANT
NS IRRIG 1000ML POUR BTL (IV SOLUTION) ×1 IMPLANT
PACK BASIN DAY SURGERY FS (CUSTOM PROCEDURE TRAY) ×1 IMPLANT
PACK ENT DAY SURGERY (CUSTOM PROCEDURE TRAY) ×1 IMPLANT
SLEEVE SCD COMPRESS KNEE MED (STOCKING) IMPLANT
SPIKE FLUID TRANSFER (MISCELLANEOUS) IMPLANT
SPLINT NASAL AIRWAY SILICONE (MISCELLANEOUS) ×1 IMPLANT
SPONGE NEURO XRAY DETECT 1X3 (DISPOSABLE) ×1 IMPLANT
SUT CHROMIC 4 0 P 3 18 (SUTURE) ×1 IMPLANT
SUT PLAIN 4 0 ~~LOC~~ 1 (SUTURE) ×1 IMPLANT
SUT PROLENE 3 0 PS 2 (SUTURE) ×1 IMPLANT
SUT VIC AB 4-0 P-3 18XBRD (SUTURE) IMPLANT
SUT VIC AB 4-0 P3 18 (SUTURE)
TOWEL GREEN STERILE FF (TOWEL DISPOSABLE) ×1 IMPLANT
TUBE SALEM SUMP 12F (TUBING) IMPLANT
TUBE SALEM SUMP 16F (TUBING) ×1 IMPLANT
YANKAUER SUCT BULB TIP NO VENT (SUCTIONS) ×1 IMPLANT

## 2022-11-13 NOTE — Discharge Instructions (Addendum)

## 2022-11-13 NOTE — Anesthesia Procedure Notes (Signed)
Procedure Name: Intubation Date/Time: 11/13/2022 9:21 AM  Performed by: Demetrio Lapping, CRNAPre-anesthesia Checklist: Patient identified, Emergency Drugs available, Suction available and Patient being monitored Patient Re-evaluated:Patient Re-evaluated prior to induction Oxygen Delivery Method: Circle System Utilized Preoxygenation: Pre-oxygenation with 100% oxygen Induction Type: IV induction Ventilation: Mask ventilation without difficulty and Oral airway inserted - appropriate to patient size Laryngoscope Size: Mac and 4 Grade View: Grade I Tube type: Oral Tube size: 7.5 mm Number of attempts: 1 Airway Equipment and Method: Stylet Placement Confirmation: ETT inserted through vocal cords under direct vision, positive ETCO2 and breath sounds checked- equal and bilateral Secured at: 23 cm Tube secured with: Tape Dental Injury: Teeth and Oropharynx as per pre-operative assessment

## 2022-11-13 NOTE — Anesthesia Preprocedure Evaluation (Addendum)
Anesthesia Evaluation  Patient identified by MRN, date of birth, ID band Patient awake    Reviewed: Allergy & Precautions, NPO status , Patient's Chart, lab work & pertinent test results, reviewed documented beta blocker date and time   Airway Mallampati: II  TM Distance: >3 FB Neck ROM: Full    Dental  (+) Missing, Caps, Dental Advisory Given,    Pulmonary asthma , sleep apnea and Continuous Positive Airway Pressure Ventilation , pneumonia, resolved Deviated nasal septum Bilateral turbinate hypertrophy   Pulmonary exam normal breath sounds clear to auscultation       Cardiovascular + CAD  + dysrhythmias Atrial Fibrillation  Rhythm:Irregular Rate:Normal  EKG 11/03/22 Atrial fibrillation 74/min  Echo 2016 LVEF 50-55%, no SWMA   Neuro/Psych  PSYCHIATRIC DISORDERS Anxiety Depression    Poor short term memoryGlaucoma    GI/Hepatic Neg liver ROS, PUD,GERD  ,,  Endo/Other  Hyperlipidemia  Gout Pre diabetes  Renal/GU Renal diseaseHx/o renal calculi  negative genitourinary   Musculoskeletal  (+) Arthritis , Osteoarthritis,    Abdominal   Peds  Hematology Eliquis therapy- last dose 4/19 pm Hx/o thrombocytopenia   Anesthesia Other Findings   Reproductive/Obstetrics                             Anesthesia Physical Anesthesia Plan  ASA: 3  Anesthesia Plan: General   Post-op Pain Management: Minimal or no pain anticipated, Dilaudid IV and Precedex   Induction: Intravenous  PONV Risk Score and Plan: 4 or greater and Treatment may vary due to age or medical condition, Ondansetron and Dexamethasone  Airway Management Planned: Oral ETT  Additional Equipment: None  Intra-op Plan:   Post-operative Plan: Extubation in OR  Informed Consent: I have reviewed the patients History and Physical, chart, labs and discussed the procedure including the risks, benefits and alternatives for the proposed  anesthesia with the patient or authorized representative who has indicated his/her understanding and acceptance.     Dental advisory given  Plan Discussed with: CRNA and Anesthesiologist  Anesthesia Plan Comments:         Anesthesia Quick Evaluation

## 2022-11-13 NOTE — Op Note (Signed)
DATE OF PROCEDURE: 11/13/2022  OPERATIVE REPORT   SURGEON: Newman Pies, MD   PREOPERATIVE DIAGNOSES:  1. Severe nasal septal deviation.  2. Bilateral inferior turbinate hypertrophy.  3. Chronic nasal obstruction.  POSTOPERATIVE DIAGNOSES:  1. Severe nasal septal deviation.  2. Bilateral inferior turbinate hypertrophy.  3. Chronic nasal obstruction.  PROCEDURE PERFORMED:  1. Septoplasty.  2. Bilateral partial inferior turbinate resection.   ANESTHESIA: General endotracheal tube anesthesia.   COMPLICATIONS: None.   ESTIMATED BLOOD LOSS: 100 mL.   INDICATION FOR PROCEDURE: UEL DAVIDOW is a 72 y.o. male with a history of chronic nasal obstruction. The patient was treated with antihistamine, decongestant, and steroid nasal sprays. However, the patient continued to be symptomatic. On examination, the patient was noted to have bilateral severe inferior turbinate hypertrophy and significant nasal septal deviation, causing significant nasal obstruction. Based on the above findings, the decision was made for the patient to undergo the above-stated procedures. The risks, benefits, alternatives, and details of the procedures were discussed with the patient. Questions were invited and answered. Informed consent was obtained.   DESCRIPTION OF PROCEDURE: The patient was taken to the operating room and placed supine on the operating table. General endotracheal tube anesthesia was administered by the anesthesiologist. The patient was positioned, and prepped and draped in the standard fashion for nasal surgery. Pledgets soaked with Afrin were placed in both nasal cavities for decongestion. The pledgets were subsequently removed.   Examination of the nasal cavity revealed a severe nasal septal deviation. 1% lidocaine with 1:100,000 epinephrine was injected onto the nasal septum bilaterally. A hemitransfixion incision was made on the left side. The mucosal flap was carefully elevated on the left side. A  cartilaginous incision was made 1 cm superior to the caudal margin of the nasal septum. Mucosal flap was also elevated on the right side in the similar fashion. It should be noted that due to the severe septal deviation, the deviated portion of the cartilaginous and bony septum had to be removed in piecemeal fashion. Once the deviated portions were removed, a straight midline septum was achieved. The septum was then quilted with 4-0 plain gut sutures. The hemitransfixion incision was closed with interrupted 4-0 chromic sutures.   The inferior one half of both hypertrophied inferior turbinate was crossclamped with a Kelly clamp. The inferior one half of each inferior turbinate was then resected with a pair of cross cutting scissors. Hemostasis was achieved with a suction cautery device. Doyle splints were applied to the nasal septum.  The care of the patient was turned over to the anesthesiologist. The patient was awakened from anesthesia without difficulty. The patient was extubated and transferred to the recovery room in good condition.   OPERATIVE FINDINGS: Severe nasal septal deviation and bilateral inferior turbinate hypertrophy.   SPECIMEN: None.   FOLLOWUP CARE: The patient be discharged home once he is awake and alert. The patient will follow up in my office in 3 days for splint removal.   Patricie Geeslin Philomena Doheny, MD

## 2022-11-13 NOTE — Transfer of Care (Signed)
Immediate Anesthesia Transfer of Care Note  Patient: Francisco Padilla  Procedure(s) Performed: NASAL SEPTOPLASTY WITH BILATERAL TURBINATE REDUCTION (Bilateral: Nose)  Patient Location: PACU  Anesthesia Type:General  Level of Consciousness: awake and patient cooperative  Airway & Oxygen Therapy: Patient Spontanous Breathing and Patient connected to face mask oxygen  Post-op Assessment: Report given to RN and Post -op Vital signs reviewed and stable  Post vital signs: Reviewed and stable  Last Vitals:  Vitals Value Taken Time  BP 130/98   Temp    Pulse 109 11/13/22 1040  Resp 14   SpO2 98 % 11/13/22 1040  Vitals shown include unvalidated device data.  Last Pain:  Vitals:   11/13/22 0833  TempSrc: Oral  PainSc: 0-No pain         Complications: No notable events documented.

## 2022-11-13 NOTE — H&P (Signed)
Cc: Chronic nasal obstruction  HPI: The patient is a 72 year old male who returns today for his follow-up evaluation.  He was last seen 2 weeks ago.  At that time, he was complaining of recurrent sinusitis and chronic nasal obstruction.  He was previously treated with Flonase nasal spray.  He stopped the use of Flonase due to his glaucoma and macular degeneration.  At his last visit, he was noted to have nasal mucosal congestion, nasal septal deviation, and bilateral inferior turbinate hypertrophy.  Nasal saline irrigation was encouraged.  He subsequently underwent a sinus CT scan.  The CT showed no significant acute or chronic sinusitis.  Nasal septal deviation and bilateral inferior turbinate hypertrophy were noted.  In addition, the CT also showed a 12 mm low density lesion within the left frontal calvarium.  The lesion was not fully imaged in its entirety.  A head CT scan was recommended by the radiologist.  The patient returns today complaining of persistent nasal obstruction.  He denies any fever, facial pain, or visual change.  Exam: General: Appears normal, non-syndromic, in no acute distress. General: Communicates without difficulty, well nourished, no acute distress. Head: Normocephalic, no evidence injury, no tenderness, facial buttresses intact without stepoff. Face/sinus: No tenderness to palpation and percussion. Facial movement is normal and symmetric. Eyes: PERRL, EOMI. No scleral icterus, conjunctivae clear. Neuro: CN II exam reveals vision grossly intact.  No nystagmus at any point of gaze. Ears: Auricles well formed without lesions.  Ear canals are intact without mass or lesion.  No erythema or edema is appreciated.  The TMs are intact without fluid. Nose: External evaluation reveals normal support and skin without lesions.  Dorsum is intact.  Anterior rhinoscopy reveals congested mucosa over anterior aspect of inferior turbinates and intact septum.  No purulence noted. Oral:  Oral cavity  and oropharynx are intact, symmetric, without erythema or edema.  Mucosa is moist without lesions. Neck: Full range of motion without pain.  There is no significant lymphadenopathy.  No masses palpable.  Thyroid bed within normal limits to palpation.  Parotid glands and submandibular glands equal bilaterally without mass.  Trachea is midline. Neuro:  CN 2-12 grossly intact. Gait normal. A flexible scope was inserted into the right nasal cavity.  Endoscopy of the interior nasal cavity, superior, inferior, and middle meatus was performed. The sphenoid-ethmoid recess was examined. Edematous mucosa was noted.  No polyp, mass, or lesion was appreciated. Nasal septal deviation noted.  Olfactory cleft was clear.  Nasopharynx was clear.  Turbinates were hypertrophied but without mass. The procedure was repeated on the contralateral side with similar findings.  The patient tolerated the procedure well.   Assessment: 1.  Chronic rhinitis with nasal mucosal congestion, nasal septal deviation, and bilateral inferior turbinate hypertrophy. 2.  No acute or chronic sinusitis was noted on his recent CT scan. 3.  A 12 mm lesion was noted within the left frontal calvarium.  The radiologist recommended a head CT scan.  Plan: 1.  The nasal endoscopy findings and the CT results are reviewed with the patient. 2.  Continue with nasal saline irrigation daily.  The patient cannot tolerate the use of steroid nasal spray due to his glaucoma and macular degeneration. 3.  In light of his persistent symptoms, he may benefit from surgical intervention with septoplasty and bilateral turbinate reduction.  The risk, benefits, and details of the procedures are reviewed.  Questions are invited and answered. 4.  The patient would like to proceed with the procedures.

## 2022-11-13 NOTE — Anesthesia Postprocedure Evaluation (Signed)
Anesthesia Post Note  Patient: Francisco Padilla  Procedure(s) Performed: NASAL SEPTOPLASTY WITH BILATERAL TURBINATE REDUCTION (Bilateral: Nose)     Patient location during evaluation: PACU Anesthesia Type: General Level of consciousness: awake and alert Pain management: pain level controlled Vital Signs Assessment: post-procedure vital signs reviewed and stable Respiratory status: spontaneous breathing, nonlabored ventilation and respiratory function stable Cardiovascular status: blood pressure returned to baseline Postop Assessment: no apparent nausea or vomiting Anesthetic complications: no   No notable events documented.  Last Vitals:  Vitals:   11/13/22 1115 11/13/22 1145  BP: 130/64 121/87  Pulse: 83 85  Resp: 12 16  Temp:  36.6 C  SpO2: 92% 98%    Last Pain:  Vitals:   11/13/22 1145  TempSrc:   PainSc: 0-No pain                 Shanda Howells

## 2022-11-14 ENCOUNTER — Encounter (HOSPITAL_BASED_OUTPATIENT_CLINIC_OR_DEPARTMENT_OTHER): Payer: Self-pay | Admitting: Otolaryngology

## 2022-11-14 ENCOUNTER — Ambulatory Visit: Payer: Medicare Other | Admitting: Physical Therapy

## 2022-11-16 DIAGNOSIS — F332 Major depressive disorder, recurrent severe without psychotic features: Secondary | ICD-10-CM | POA: Diagnosis not present

## 2022-11-17 DIAGNOSIS — F332 Major depressive disorder, recurrent severe without psychotic features: Secondary | ICD-10-CM | POA: Diagnosis not present

## 2022-11-20 ENCOUNTER — Encounter: Payer: Self-pay | Admitting: Physical Therapy

## 2022-11-20 ENCOUNTER — Ambulatory Visit: Payer: Medicare Other | Attending: Internal Medicine | Admitting: Physical Therapy

## 2022-11-20 ENCOUNTER — Other Ambulatory Visit: Payer: Self-pay

## 2022-11-20 ENCOUNTER — Ambulatory Visit: Payer: Medicare Other | Admitting: Physical Therapy

## 2022-11-20 DIAGNOSIS — R296 Repeated falls: Secondary | ICD-10-CM | POA: Insufficient documentation

## 2022-11-20 DIAGNOSIS — F332 Major depressive disorder, recurrent severe without psychotic features: Secondary | ICD-10-CM | POA: Diagnosis not present

## 2022-11-20 DIAGNOSIS — R2689 Other abnormalities of gait and mobility: Secondary | ICD-10-CM | POA: Diagnosis not present

## 2022-11-20 DIAGNOSIS — M6281 Muscle weakness (generalized): Secondary | ICD-10-CM | POA: Diagnosis not present

## 2022-11-20 NOTE — Patient Instructions (Signed)
Access Code: [email or web address] URL: [email or web address] Date: 11/20/2022 Prepared by: Rosana Hoes  Exercises - Supine Bridge  - 1 x daily - 2 sets - 10 reps - 5 seconds hold - Active Straight Leg Raise with Quad Set  - 1 x daily - 2 sets - 10 reps - Sidelying Hip Abduction  - 1 x daily - 2 sets - 10 reps - Standing Row with Anchored Resistance  - 1 x daily - 3 sets - 20 reps - Sit to Stand Without Arm Support  - 1 x daily - 3 sets - 10 reps - Heel Raises with Counter Support  - 1 x daily - 3 sets - 20 reps - Standing Tandem Balance with Counter Support  - 1 x daily - 3 reps - 30 seconds hold

## 2022-11-20 NOTE — Therapy (Signed)
OUTPATIENT PHYSICAL THERAPY EVALUATION   Patient Name: Francisco Padilla MRN: [id number] DOB:16-Apr-1951, 72 y.o., male Today's Date: 11/20/2022   END OF SESSION:  PT End of Session - 11/20/22 1405     Visit Number 1    Number of Visits 7    Date for PT Re-Evaluation 01/01/23    Authorization Type MCR    PT Start Time 1401    PT Stop Time 1445    PT Time Calculation (min) 44 min    Activity Tolerance Patient tolerated treatment well    Behavior During Therapy WFL for tasks assessed/performed             Past Medical History:  Diagnosis Date   ADD (attention deficit disorder)    Anxiety 2007   Asthma as child   Asymptomatic gallstones    Atrial fibrillation (HCC)    Atrial flutter (HCC)    Depression    DJD (degenerative joint disease) of knee    Dysrhythmia    Chronic Atrial Fib- seeing Dr. Shela [facility]   H/O peptic ulcer    past history with GI bleed- cauterization done throught scope   History of kidney stones    pt claims urology discovered a kidney stone last week 05/25/21   Hypercholesterolemia    Orthostatic hypotension    Pneumonia as child   Pneumothorax on left 05/20/2009   Prediabetes    none now   Pulmonary nodules    Sleep apnea    cpap set on 13   Thrombocytopenia (HCC)    pt denies   Thyromegaly    Varicose veins    Past Surgical History:  Procedure Laterality Date   BIOPSY  04/18/2018   Procedure: BIOPSY;  Surgeon: Kerin Salen, MD;  Location: [facility];  Service: Gastroenterology;;   ESOPHAGOGASTRODUODENOSCOPY N/A 04/18/2018   Procedure: ESOPHAGOGASTRODUODENOSCOPY (EGD);  Surgeon: Kerin Salen, MD;  Location: [facility];  Service: Gastroenterology;  Laterality: N/A;   ESOPHAGOGASTRODUODENOSCOPY (EGD) WITH PROPOFOL N/A 01/05/2015   Procedure: ESOPHAGOGASTRODUODENOSCOPY (EGD) WITH PROPOFOL;  Surgeon: Charolett Bumpers, MD;  Location: [facility];  Service: Endoscopy;  Laterality: N/A;   EXTRACORPOREAL SHOCK WAVE LITHOTRIPSY Left  08/11/2021   Procedure: LEFT EXTRACORPOREAL SHOCK WAVE LITHOTRIPSY (ESWL);  Surgeon: Jannifer Hick, MD;  Location: [facility];  Service: Urology;  Laterality: Left;   GASTRIC ROUX-EN-Y     '08-[name] (weight stable around 302 #   KNEE ARTHROSCOPY     NASAL SEPTOPLASTY W/ TURBINOPLASTY Bilateral 11/13/2022   Procedure: NASAL SEPTOPLASTY WITH BILATERAL TURBINATE REDUCTION;  Surgeon: Newman Pies, MD;  Location: [facility];  Service: ENT;  Laterality: Bilateral;   TONSILLECTOMY     TOTAL KNEE ARTHROPLASTY Left 06/03/2021   Procedure: LEFT TOTAL KNEE ARTHROPLASTY;  Surgeon: Nadara Mustard, MD;  Location[facility];  Service: Orthopedics;  Laterality: Left;   ULNAR NERVE TRANSPOSITION Right 15 yrs ago   Patient Active Problem List   Diagnosis Date Noted   Total knee replacement status, 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   Unilateral primary osteoarthritis, left knee    Longstanding persistent atrial fibrillation (HCC) 04/26/2021   Preop cardiovascular exam 04/26/2021   Orthostatic hypotension 02/20/2019   Chronic anticoagulation 03/05/2015   Abnormal chest x-ray with multiple lung nodules 12/14/2014   AS (aortic stenosis) 12/14/2014   Atrial flutter by electrocardiogram (HCC) 12/14/2014   CAD (coronary artery disease) 12/14/2014   DJD (degenerative joint disease) 12/14/2014   History of GI bleed 12/14/2014   Nephrolithiasis 12/14/2014  Other activity(E029.9) 12/14/2014   Prediabetes 12/14/2014   Thrombocytopenia (HCC) 12/14/2014   Arterial blood pressure decreased 10/28/2014   Atrial fibrillation (HCC) 09/02/2014   Morbid obesity (HCC) 09/02/2014   AF (paroxysmal atrial fibrillation) (HCC) 08/03/2014   Gastroduodenal ulcer 08/03/2014   Apnea, sleep 08/03/2014   Glaucoma suspect 02/21/2013   Glaucoma suspect of both eyes 02/21/2013   Herpes 04/11/2012   Routine general medical examination at a health care facility 04/11/2012   DYSLIPIDEMIA 05/31/2009   DEPRESSION/ANXIETY  05/31/2009   ATTENTION DEFICIT DISORDER 05/31/2009   Obstructive sleep apnea 05/31/2009   GERD 05/31/2009   GOUT, HX OF 05/31/2009    PCP: Emilio Aspen, MD  REFERRING PROVIDER: Emilio Aspen, MD  REFERRING DIAG: Piriformis syndrome  Rationale for Evaluation and Treatment: Rehabilitation  THERAPY DIAG:  Muscle weakness (generalized)  Repeated falls  Other abnormalities of gait and mobility  ONSET DATE: a few months   SUBJECTIVE:                                                                                                                                                                                          SUBJECTIVE STATEMENT: Patient reports he developed right hip pain and sciatica when he was sitting on the side of the bed, maybe when putting on his socks. So he went to the doctor and they prescribed him so medications that helped resolve his pain. Currently he feels due to the hip pain and recent surgeries that he has lost some strength and his balance has gotten worse. He also notes his right knee is bothering him more and his bilateral peripheral neuropathy continues to worsen.  PERTINENT HISTORY:  See PMH above  PAIN:  Are you having pain? No:  NPRS scale: 0/10 Pain location: Right hip region  PRECAUTIONS: Fall  WEIGHT BEARING RESTRICTIONS: No  FALLS:  Has patient fallen in last 6 months? Yes. Number of falls "unable to tell, not too many"  PLOF: Independent  PATIENT GOALS: Get back and shape so he can get back to the gym   OBJECTIVE:  PATIENT SURVEYS:  FOTO 36% functional status  SCREENING FOR RED FLAGS: Negative  COGNITION: Overall cognitive status: Within functional limits for tasks assessed     SENSATION: Patient with sensation deficits noted bilateral feet and lower legs  MUSCLE LENGTH: Grossly WFL  POSTURE:   Rounded shoulder posture, decreased lumbar lordosis  PALPATION: Non-tender to palpation  LOWER EXTREMITY  ROM:     LE ROM grossly WFL  LOWER EXTREMITY MMT:    MMT Right eval Left eval  Hip flexion 4 4  Hip extension 4- 4-  Hip abduction 4- 4-  Hip adduction    Hip internal rotation    Hip external rotation    Knee flexion 5 5  Knee extension 4+ 5  Ankle dorsiflexion 5 5  Ankle plantarflexion    Ankle inversion    Ankle eversion     (Blank rows = not tested)  FUNCTIONAL TESTS:  5 times sit to stand: 15 seconds Berg Balance Scale: see below  BERG BALANCE TEST Sitting to Standing: 4.      Stands without using hands and stabilize independently Standing Unsupported: 4.      Stands safely for 2 minutes Sitting Unsupported: 4.     Sits for 2 minutes independently Standing to Sitting: 4.     Sits safely with minimal use of hands Transfers: 4.     Transfers safely with minor use of hands Standing with eyes closed: 2.     Able to stand for 3 seconds Standing with feet together: 2.     Unable to hold for 30 seconds  Reaching forward with outstretched arm: 4.     Reaches forward 10 inches Retrieving object from the floor: 4.      Able to pick up easily and safely Turning to look behind: 4.     Looks behind from both sides and weight shifts well Turning 360 degrees: 2.     Able to turn slowly, but safely Place alternate foot on stool: 3.     Completes 8 steps in >20 seconds Standing with one foot in front: 0.     Loses balance while standing/stepping Standing on one foot: 0.     Unable Total Score: 41/56  GAIT: Distance walked: clinic Assistive device utilized: None Level of assistance: Complete Independence Comments: Slight forward trunk lean, wide base with bilateral toe out, occasionally becomes unsteady   TODAY'S TREATMENT:      [facility] Adult PT Treatment:                                                DATE: 11/20/2022 Therapeutic Exercise: Bridge x 10 SLR x 10 Sidelying hip abduction x 10 Sit to stand x 10  PATIENT EDUCATION:  Education details: Exam findings, POC,  HEP Person educated: Patient Education method: Explanation, Demonstration, Tactile cues, Verbal cues, and Handouts Education comprehension: verbalized understanding, returned demonstration, verbal cues required, tactile cues required, and needs further education  HOME EXERCISE PROGRAM: [email or web address]    ASSESSMENT: CLINICAL IMPRESSION: Patient is a 72 y.o. male who was seen today for physical therapy evaluation and treatment for LE weakness and balance deficits from a reduction in activity level after right hip pain and recent surgeries.  He does exhibit gross strength deficit of bilateral hips and right knee, and balance impairments.    OBJECTIVE IMPAIRMENTS: Abnormal gait, decreased activity tolerance, decreased ROM, decreased strength, impaired flexibility, postural dysfunction, and pain.   ACTIVITY LIMITATIONS: lifting, bending, squatting, stairs, and locomotion level  PARTICIPATION LIMITATIONS: meal prep, cleaning, shopping, and community activity  PERSONAL FACTORS: Fitness, Past/current experiences, and Time since onset of injury/illness/exacerbation are also affecting patient's functional outcome.   REHAB POTENTIAL: Good  CLINICAL DECISION MAKING: Stable/uncomplicated  EVALUATION COMPLEXITY: Low   GOALS: Goals reviewed with patient? Yes  SHORT TERM GOALS: Target date: 12/11/2022  Patient will be I with initial HEP in order to progress with therapy. Baseline: HEP  provided at eval Goal status: INITIAL  2.  Patient will demonstrate 5xSTS </= 12 sec in order to indicate improved LE strength and reduced fall risk  Baseline: 15 seconds Goal status: INITIAL  LONG TERM GOALS: Target date: 01/01/2023  Patient will be I with final HEP to maintain progress from PT. Baseline: HEP provided at eval Goal status: INITIAL  2.  Patient will report >/= 45% status on FOTO to indicate improved functional ability. Baseline: 36% functional status Goal status: INITIAL  3.  Patient will  demonstrate hip strength >/= 4/5 MMT in order to improve  Baseline: 4-/5 MMT Goal status: INITIAL  4.  Patient will demonstrate BERG >/= 52/56 in order to indicate improved balance and reduced fall risk  Baseline: 41/56 Goal status: INITIAL   PLAN: PT FREQUENCY: 1x/week  PT DURATION: 6 weeks  PLANNED INTERVENTIONS: Therapeutic exercises, Therapeutic activity, Neuromuscular re-education, Balance training, Gait training, Patient/Family education, Self Care, Joint mobilization, Joint manipulation, Aquatic Therapy, Dry Needling, Cryotherapy, Moist heat, Manual therapy, and Re-evaluation.  PLAN FOR NEXT SESSION: Review HEP and progress PRN, progress hip/LE strengthening and initiate balance training and postural strengthening   Rosana Hoes, PT, DPT, LAT, ATC 11/20/22  3:14 PM Phone: [phone] Fax: [phone]

## 2022-11-21 DIAGNOSIS — D2262 Melanocytic nevi of left upper limb, including shoulder: Secondary | ICD-10-CM | POA: Diagnosis not present

## 2022-11-21 DIAGNOSIS — D225 Melanocytic nevi of trunk: Secondary | ICD-10-CM | POA: Diagnosis not present

## 2022-11-21 DIAGNOSIS — D2261 Melanocytic nevi of right upper limb, including shoulder: Secondary | ICD-10-CM | POA: Diagnosis not present

## 2022-11-21 DIAGNOSIS — L820 Inflamed seborrheic keratosis: Secondary | ICD-10-CM | POA: Diagnosis not present

## 2022-11-21 DIAGNOSIS — L814 Other melanin hyperpigmentation: Secondary | ICD-10-CM | POA: Diagnosis not present

## 2022-11-21 DIAGNOSIS — F332 Major depressive disorder, recurrent severe without psychotic features: Secondary | ICD-10-CM | POA: Diagnosis not present

## 2022-11-21 DIAGNOSIS — L570 Actinic keratosis: Secondary | ICD-10-CM | POA: Diagnosis not present

## 2022-11-21 DIAGNOSIS — X32XXXS Exposure to sunlight, sequela: Secondary | ICD-10-CM | POA: Diagnosis not present

## 2022-11-22 ENCOUNTER — Emergency Department (HOSPITAL_COMMUNITY)
Admission: EM | Admit: 2022-11-22 | Discharge: 2022-11-22 | Disposition: A | Discharge status: Home / Self Care | Payer: Medicare Other | Attending: Emergency Medicine | Admitting: Emergency Medicine

## 2022-11-22 DIAGNOSIS — Z7901 Long term (current) use of anticoagulants: Secondary | ICD-10-CM | POA: Diagnosis not present

## 2022-11-22 DIAGNOSIS — R040 Epistaxis: Secondary | ICD-10-CM

## 2022-11-22 DIAGNOSIS — D72829 Elevated white blood cell count, unspecified: Secondary | ICD-10-CM | POA: Diagnosis not present

## 2022-11-22 LAB — CBC WITH DIFFERENTIAL/PLATELET
Abs Immature Granulocytes: 0.03 10*3/uL (ref 0.00–0.07)
Basophils Absolute: 0 10*3/uL (ref 0.0–0.1)
Basophils Relative: 0 %
Eosinophils Absolute: 0.7 10*3/uL — ABNORMAL HIGH (ref 0.0–0.5)
Eosinophils Relative: 6 %
HCT: 34.1 % — ABNORMAL LOW (ref 39.0–52.0)
Hemoglobin: 11.3 g/dL — ABNORMAL LOW (ref 13.0–17.0)
Immature Granulocytes: 0 %
Lymphocytes Relative: 12 %
Lymphs Abs: 1.3 10*3/uL (ref 0.7–4.0)
MCH: 30.7 pg (ref 26.0–34.0)
MCHC: 33.1 g/dL (ref 30.0–36.0)
MCV: 92.7 fL (ref 80.0–100.0)
Monocytes Absolute: 1.2 10*3/uL — ABNORMAL HIGH (ref 0.1–1.0)
Monocytes Relative: 11 %
Neutro Abs: 7.4 10*3/uL (ref 1.7–7.7)
Neutrophils Relative %: 71 %
Platelets: 205 10*3/uL (ref 150–400)
RBC: 3.68 MIL/uL — ABNORMAL LOW (ref 4.22–5.81)
RDW: 13.4 % (ref 11.5–15.5)
WBC: 10.7 10*3/uL — ABNORMAL HIGH (ref 4.0–10.5)
nRBC: 0 % (ref 0.0–0.2)

## 2022-11-22 LAB — PROTIME-INR
INR: 1.2 (ref 0.8–1.2)
Prothrombin Time: 14.9 seconds (ref 11.4–15.2)

## 2022-11-22 LAB — BASIC METABOLIC PANEL
Anion gap: 6 (ref 5–15)
BUN: 30 mg/dL — ABNORMAL HIGH (ref 8–23)
CO2: 25 mmol/L (ref 22–32)
Calcium: 8.8 mg/dL — ABNORMAL LOW (ref 8.9–10.3)
Chloride: 108 mmol/L (ref 98–111)
Creatinine, Ser: 0.74 mg/dL (ref 0.61–1.24)
GFR, Estimated: >60 mL/min (ref >60)
Glucose, Bld: 120 mg/dL — ABNORMAL HIGH (ref 70–99)
Potassium: 3.9 mmol/L (ref 3.5–5.1)
Sodium: 139 mmol/L (ref 135–145)

## 2022-11-22 MED ORDER — OXYMETAZOLINE HCL 0.05 % NA SOLN
1.0000 | Freq: Once | NASAL | Status: AC
  Administered 2022-11-22: 1 via NASAL

## 2022-11-22 MED ORDER — ACETAMINOPHEN 500 MG PO TABS
1000.0000 mg | ORAL_TABLET | Freq: Once | ORAL | Status: AC
  Administered 2022-11-22: 1000 mg via ORAL
  Filled 2022-11-22: qty 2

## 2022-11-22 MED ORDER — SODIUM CHLORIDE 0.9 % IV BOLUS
1000.0000 mL | Freq: Once | INTRAVENOUS | Status: AC
  Administered 2022-11-22: 1000 mL via INTRAVENOUS

## 2022-11-22 MED ORDER — CEPHALEXIN 500 MG PO CAPS: 500.0000 mg | ORAL_CAPSULE | Freq: Four times a day (QID) | ORAL | 0 refills | Status: DC

## 2022-11-22 NOTE — ED Provider Notes (Signed)
[place] EMERGENCY DEPARTMENT AT [facility] Provider Note   CSN: [id number] Arrival date & time: 11/22/22  0940     History  Chief Complaint  Patient presents with   Epistaxis    Francisco Padilla is a 72 y.o. male with chronic anticoagulation (eliquis) here for evaluation of epistaxis. Began this AM. Attempted pressure without relief. Recent septoplasty with Dr. Suszanne Conners 9 days ago. Previously had nasal packing however removed last Thursday. No new trauma. Has some lightheadedness. Spitting out blood  HPI     Home Medications Prior to Admission medications   Medication Sig Start Date End Date Taking? Authorizing Provider  acetaminophen (TYLENOL) 650 MG CR tablet Take 1,300 mg by mouth every 8 (eight) hours as needed for pain.    [provider]  atorvastatin (LIPITOR) 20 MG tablet Take 20 mg by mouth at bedtime. 04/03/21   [provider]  fludrocortisone (FLORINEF) 0.1 MG tablet Take 0.1 mg by mouth daily. 03/07/19   [provider]  gabapentin (NEURONTIN) 100 MG capsule Take 4 capsules (400 mg total) by mouth at bedtime. 12/23/21   Vivi Barrack, DPM  latanoprost (XALATAN) 0.005 % ophthalmic solution Place 1 drop into both eyes at bedtime. 04/23/21   [provider]  LORazepam (ATIVAN) 1 MG tablet Take 0.5 mg by mouth every 4 (four) hours.    [provider]  Multiple Vitamins-Minerals (ONE DAILY MULTIVITAMIN MEN) TABS Take 1 tablet by mouth 2 (two) times daily. 04/13/06   [provider]  pantoprazole (PROTONIX) 40 MG tablet Take 80 mg by mouth at bedtime.  10/24/09   [provider]  Polyethyl Glycol-Propyl Glycol (SYSTANE OP) Place 1 drop into both eyes daily as needed (dry eyes).    [provider]  Probiotic Product (PROBIOTIC PO) Take 1 capsule by mouth daily.    [provider]  SUPER B COMPLEX/C PO Take 1 tablet by mouth daily.    [provider]  zolpidem (AMBIEN) 10 MG tablet  Take 5-10 mg by mouth at bedtime. 08/12/14   [provider]      Allergies    Aspirin, Nsaids, and Oxycodone hcl    Review of Systems   Review of Systems  Constitutional: Negative.   HENT:  Positive for nosebleeds.   Respiratory: Negative.    Cardiovascular: Negative.  Negative for chest pain.  Gastrointestinal: Negative.   Musculoskeletal: Negative.   All other systems reviewed and are negative.   Physical Exam Updated Vital Signs BP (!) 149/101 (BP Location: Right Arm)   Pulse 100   Temp 98 F (36.7 C) (Axillary)   Resp 15   Ht 6\' 3"  (1.905 m)   Wt 102 kg   SpO2 100%   BMI 28.11 kg/m  Physical Exam Vitals and nursing note reviewed.  Constitutional:      General: He is not in acute distress.    Appearance: He is well-developed. He is not ill-appearing, toxic-appearing or diaphoretic.  HENT:     Head: Atraumatic.     Nose:     Right Nostril: Epistaxis present. No septal hematoma.     Left Nostril: Epistaxis present. No septal hematoma.     Comments: Epistaxis bilateral nares however seems to be coming out of the right nare with overflow    Mouth/Throat:     Mouth: Mucous membranes are moist.     Pharynx: Oropharynx is clear. Uvula midline.  Eyes:     Pupils: Pupils are equal, round,  and reactive to light.  Cardiovascular:     Rate and Rhythm: Normal rate and regular rhythm.  Pulmonary:     Effort: Pulmonary effort is normal. No respiratory distress.  Abdominal:     General: There is no distension.     Palpations: Abdomen is soft.  Musculoskeletal:        General: Normal range of motion.     Cervical back: Normal range of motion and neck supple.  Skin:    General: Skin is warm and dry.  Neurological:     General: No focal deficit present.     Mental Status: He is alert and oriented to person, place, and time.     Cranial Nerves: Cranial nerves 2-12 are intact.     Sensory: Sensation is intact.     Motor: Motor function is intact.      Coordination: Coordination is intact.     Gait: Gait is intact.     ED Results / Procedures / Treatments   Labs (all labs ordered are listed, but only abnormal results are displayed) Labs Reviewed  BASIC METABOLIC PANEL - Abnormal; Notable for the following components:      Result Value   Glucose, Bld 120 (*)    BUN 30 (*)    Calcium 8.8 (*)    All other components within normal limits  CBC WITH DIFFERENTIAL/PLATELET - Abnormal; Notable for the following components:   WBC 10.7 (*)    RBC 3.68 (*)    Hemoglobin 11.3 (*)    HCT 34.1 (*)    Monocytes Absolute 1.2 (*)    Eosinophils Absolute 0.7 (*)    All other components within normal limits  PROTIME-INR  CBC WITH DIFFERENTIAL/PLATELET    EKG None  Radiology No results found.  Procedures .Epistaxis Management  Date/Time: 11/22/2022 12:36 PM  Performed by: Ralph Leyden A, PA-C Authorized by: Linwood Dibbles, PA-C   Consent:    Consent obtained:  Verbal   Consent given by:  Patient   Risks, benefits, and alternatives were discussed: yes     Risks discussed:  Bleeding, infection, nasal injury and pain   Alternatives discussed:  Referral, observation, alternative treatment, delayed treatment and no treatment Universal protocol:    Procedure explained and questions answered to patient or proxy's satisfaction: yes     Relevant documents present and verified: yes     Test results available: yes     Imaging studies available: yes     Required blood products, implants, devices, and special equipment available: yes     Site/side marked: yes     Immediately prior to procedure, a time out was called: yes     Patient identity confirmed:  Verbally with patient Anesthesia:    Anesthesia method:  None Procedure details:    Treatment site:  Unable to specify   Treatment method:  Gel foam and anterior pack   Treatment complexity:  Extensive   Treatment episode: initial   Post-procedure details:    Assessment:  Bleeding  decreased   Procedure completion:  Tolerated well, no immediate complications     Medications Ordered in ED Medications  oxymetazoline (AFRIN) 0.05 % nasal spray 1 spray (1 spray Each Nare Given 11/22/22 0958)  acetaminophen (TYLENOL) tablet 1,000 mg (1,000 mg Oral Given 11/22/22 1156)  sodium chloride 0.9 % bolus 1,000 mL (0 mLs Intravenous Stopped 11/22/22 1458)    ED Course/ Medical Decision Making/ A&P Clinical Course as of 11/22/22 1509  Wed Nov 22, 2022  1029 Dr. Suszanne Conners with ENT. Rec placing longest rhinorocket [BH]    Clinical Course User Index [BH] Jeffrie Lofstrom A, PA-C   72 year old chronically anticoagulated here for evaluation of epistaxis.  Septoplasty with Dr. Suszanne Conners approximately 9 days ago.  Show did not help him.  However about he is actively bleeding out of bilateral nares however left side appears to be overflow mainly coming from right nares.  Spitting out blood which I suspect is from his nosebleed.  No other sites of bleeding.  Patient given Afrin with pressure without relief.  Will plan discussed with Dr. Suszanne Conners checking basic labs  Labs personally viewed and interpreted:  CBC leukocytosis 10.7, hemoglobin 11.3 similar to prior INR 1.2 BMP no significant abnormality  CONSULT Dr. Suszanne Conners, Rec rhinorocket and obs, can FU in office with ABX  Patient reassessed. Starting to bleed again. Now lightheaded and HA. Non focal exam. Will start IVF and reconsult [facility]  CONSULT with Dr. Suszanne Conners rec IVF, labs will see after clinic.  Patient reassessed.  Got IV fluids and Tylenol.  Headache and lightheadedness resolved.  He continues to have nonfocal neuroexam without deficits.  Awaiting ENT, Dr. Suszanne Conners for further evaluation.  He continues to bleed out of his right nares despite packing  Transferred to oncoming provider who will follow-up on ENT reassessment                              Medical Decision Making Amount and/or Complexity of Data Reviewed External Data Reviewed: labs  and notes. Labs: ordered. Decision-making details documented in ED Course.  Risk OTC drugs. Prescription drug management. Parenteral controlled substances. Decision regarding hospitalization. Diagnosis or treatment significantly limited by social determinants of health.           Final Clinical Impression(s) / ED Diagnoses Final diagnoses:  Epistaxis  Chronic anticoagulation    Rx / DC Orders ED Discharge Orders     None         Lujean Ebright A, PA-C 11/22/22 1509    Benjiman Core, MD 11/22/22 1528

## 2022-11-22 NOTE — Discharge Instructions (Addendum)
You have been seen and evaluated by ENT specialist Dr. Suszanne Conners.  Please keep nasal packing in place until you can be reevaluated in his office on Monday of next week.  The specialist also recommend for you to hold off your Eliquis until your upcoming appointment with him.  Take antibiotic as prescribed.  Return if you have any concern.

## 2022-11-22 NOTE — ED Triage Notes (Signed)
Nasal septoplasty on 4/22. On Eliquis and Aspirin. Here for nose bleed since 2AM. Alert and oriented x 4. Airway intact.

## 2022-11-22 NOTE — ED Provider Notes (Signed)
Nosebleed, had septoplasty 9 days ago. Had rhino rocket placed here.  Suszanne Conners will see pt in the ER.   6:01 PM ENT specialist, Dr. Suszanne Conners seen and evaluated patient and has switched out the [facility] for a better fitted [employer].  He recommend patient to be monitored for an additional hour prior to discharge.  He also recommend patient to be started on antibiotic to cover for gram-positive biotics and to follow-up in his office early next week.  He also request patient to hold his Eliquis in the meantime to he can be seen in the office.  8:29 PM Unfortunately, bleeding still persist and patient voiced concern.  I reach out to Dr. Suszanne Conners who recommend hospital admission for observation and he will be available as needed for any intervention.  Labs obtained independently reviewed interpreted by me and remarkable for hemoglobin of 11.3.  Electrolytes are reassuring.   9:35 PM At this time, patient states he feels more comfortable going home.  He felt that he can return if his condition worsen but otherwise he prefers to sleep in his own bed.  On reassessment he still having some minimal bleeding but I felt patient is stable to go home.  BP 130/67   Pulse 88   Temp 98.2 F (36.8 C)   Resp 16   Ht 6\' 3"  (1.905 m)   Wt 102 kg   SpO2 99%   BMI 28.11 kg/m   Results for orders placed or performed during the hospital encounter of 11/22/22  Basic metabolic panel  Result Value Ref Range   Sodium 139 135 - 145 mmol/L   Potassium 3.9 3.5 - 5.1 mmol/L   Chloride 108 98 - 111 mmol/L   CO2 25 22 - 32 mmol/L   Glucose, Bld 120 (H) 70 - 99 mg/dL   BUN 30 (H) 8 - 23 mg/dL   Creatinine, Ser 1.61 0.61 - 1.24 mg/dL   Calcium 8.8 (L) 8.9 - 10.3 mg/dL   GFR, Estimated >09 >60 mL/min   Anion gap 6 5 - 15  Protime-INR  Result Value Ref Range   Prothrombin Time 14.9 11.4 - 15.2 seconds   INR 1.2 0.8 - 1.2  CBC with Differential  Result Value Ref Range   WBC 10.7 (H) 4.0 - 10.5 K/uL   RBC 3.68 (L)  4.22 - 5.81 MIL/uL   Hemoglobin 11.3 (L) 13.0 - 17.0 g/dL   HCT 45.4 (L) 09.8 - 11.9 %   MCV 92.7 80.0 - 100.0 fL   MCH 30.7 26.0 - 34.0 pg   MCHC 33.1 30.0 - 36.0 g/dL   RDW 14.7 82.9 - 56.2 %   Platelets 205 150 - 400 K/uL   nRBC 0.0 0.0 - 0.2 %   Neutrophils Relative % 71 %   Neutro Abs 7.4 1.7 - 7.7 K/uL   Lymphocytes Relative 12 %   Lymphs Abs 1.3 0.7 - 4.0 K/uL   Monocytes Relative 11 %   Monocytes Absolute 1.2 (H) 0.1 - 1.0 K/uL   Eosinophils Relative 6 %   Eosinophils Absolute 0.7 (H) 0.0 - 0.5 K/uL   Basophils Relative 0 %   Basophils Absolute 0.0 0.0 - 0.1 K/uL   Immature Granulocytes 0 %   Abs Immature Granulocytes 0.03 0.00 - 0.07 K/uL   VAS [place] LOWER EXTREMITY VENOUS REFLUX  Result Date: 11/08/2022  Lower Venous Reflux Study Patient Name:  Francisco Padilla  Date of Exam:   11/08/2022 Medical Rec #: [id number]  Accession #:    [id number] Date of Birth: 09-Sep-1950      Patient Gender: M Patient Age:   72 years Exam Location:  [facility] Procedure:      VAS [place] LOWER EXTREMITY VENOUS REFLUX Referring Phys: Lemar Livings --------------------------------------------------------------------------------  Indications: Venous insufficiency.  Risk Factors: Surgery Bilateral GSV ablation by Dr. Arbie Cookey. Performing Technologist: Thereasa Parkin RVT  Examination Guidelines: A complete evaluation includes B-mode imaging, spectral Doppler, color Doppler, and power Doppler as needed of all accessible portions of each vessel. Bilateral testing is considered an integral part of a complete examination. Limited examinations for reoccurring indications may be performed as noted. The reflux portion of the exam is performed with the patient in reverse Trendelenburg. Significant venous reflux is defined as >500 ms in the superficial venous system, and >1 second in the deep venous system.  Venous Reflux Times +-------------+---------+------+-----------+------------+--------+ RIGHT         Reflux NoRefluxReflux TimeDiameter cmsComments                        Yes                                  +-------------+---------+------+-----------+------------+--------+ CFV                    yes   >1 second                      +-------------+---------+------+-----------+------------+--------+ FV prox                yes   >1 second                      +-------------+---------+------+-----------+------------+--------+ FV mid       no                                             +-------------+---------+------+-----------+------------+--------+ FV dist                yes   >1 second                      +-------------+---------+------+-----------+------------+--------+ Popliteal    no                                             +-------------+---------+------+-----------+------------+--------+ GSV at SFJ             yes    >500 ms      0.54             +-------------+---------+------+-----------+------------+--------+ SSV Pop Foss[place]                           0.374             +-------------+---------+------+-----------+------------+--------+ SSV prox calfno                           0.379             +-------------+---------+------+-----------+------------+--------+ SSV mid calf no  0.324             +-------------+---------+------+-----------+------------+--------+ AASV at [facility]  no                           0.477             +-------------+---------+------+-----------+------------+--------+ AASV prox              yes    >500 ms     0.585             +-------------+---------+------+-----------+------------+--------+  +--------------+---------+------+-----------+------------+----------------+ LEFT          Reflux NoRefluxReflux TimeDiameter cmsComments                                 Yes                                           +--------------+---------+------+-----------+------------+----------------+ CFV                     yes   >1 second                              +--------------+---------+------+-----------+------------+----------------+ FV prox                 yes   >1 second                              +--------------+---------+------+-----------+------------+----------------+ FV mid                  yes   >1 second                              +--------------+---------+------+-----------+------------+----------------+ FV dist                 yes   >1 second                              +--------------+---------+------+-----------+------------+----------------+ Popliteal               yes   >1 second                              +--------------+---------+------+-----------+------------+----------------+ GSV at SFJ              yes    >500 ms      1.02                     +--------------+---------+------+-----------+------------+----------------+ GSV prox thigh          yes    >500 ms     0.869                     +--------------+---------+------+-----------+------------+----------------+ GSV mid thigh           yes    >500 ms      1.16                     +--------------+---------+------+-----------+------------+----------------+ GSV dist thigh  yes    >500 ms      1.06    chronic thrombus +--------------+---------+------+-----------+------------+----------------+ GSV at knee             yes    >500 ms     0.741                     +--------------+---------+------+-----------+------------+----------------+ GSV prox calf           yes    >500 ms     0.869                     +--------------+---------+------+-----------+------------+----------------+ SSV Pop Fossa no                           0.335                     +--------------+---------+------+-----------+------------+----------------+ SSV prox calf no                            0.493                     +--------------+---------+------+-----------+------------+----------------+ SSV mid calf  no                           0.456                     +--------------+---------+------+-----------+------------+----------------+   Summary: Right: - No evidence of deep vein thrombosis seen in the right lower extremity, from the common femoral through the popliteal veins. - No evidence of superficial venous thrombosis in the right lower extremity. - Postive deep vein reflux. - Positve superficial vein reflux.  Left: - No evidence of deep vein thrombosis seen in the left lower extremity, from the common femoral through the popliteal veins. - No evidence of superficial venous thrombosis in the left lower extremity.  - Positive deep vein reflux -Positive superficial vein reflux.  *See table(s) above for measurements and observations. Electronically signed by Lemar Livings MD on 11/08/2022 at 4:17:13 PM.    Final        Fayrene Helper, PA-C 11/22/22 2136    Wynetta Fines, MD 11/23/22 [id number]

## 2022-11-23 DIAGNOSIS — F332 Major depressive disorder, recurrent severe without psychotic features: Secondary | ICD-10-CM | POA: Diagnosis not present

## 2022-11-24 DIAGNOSIS — F332 Major depressive disorder, recurrent severe without psychotic features: Secondary | ICD-10-CM | POA: Diagnosis not present

## 2022-11-28 DIAGNOSIS — F332 Major depressive disorder, recurrent severe without psychotic features: Secondary | ICD-10-CM | POA: Diagnosis not present

## 2022-11-29 DIAGNOSIS — I8393 Asymptomatic varicose veins of bilateral lower extremities: Secondary | ICD-10-CM

## 2022-11-29 DIAGNOSIS — F331 Major depressive disorder, recurrent, moderate: Secondary | ICD-10-CM | POA: Diagnosis not present

## 2022-11-29 DIAGNOSIS — F332 Major depressive disorder, recurrent severe without psychotic features: Secondary | ICD-10-CM | POA: Diagnosis not present

## 2022-11-29 DIAGNOSIS — F411 Generalized anxiety disorder: Secondary | ICD-10-CM | POA: Diagnosis not present

## 2022-11-29 DIAGNOSIS — D649 Anemia, unspecified: Secondary | ICD-10-CM | POA: Diagnosis not present

## 2022-11-30 DIAGNOSIS — F332 Major depressive disorder, recurrent severe without psychotic features: Secondary | ICD-10-CM | POA: Diagnosis not present

## 2022-12-01 DIAGNOSIS — F332 Major depressive disorder, recurrent severe without psychotic features: Secondary | ICD-10-CM | POA: Diagnosis not present

## 2022-12-01 DIAGNOSIS — F411 Generalized anxiety disorder: Secondary | ICD-10-CM | POA: Diagnosis not present

## 2022-12-04 DIAGNOSIS — F332 Major depressive disorder, recurrent severe without psychotic features: Secondary | ICD-10-CM | POA: Diagnosis not present

## 2022-12-05 DIAGNOSIS — F332 Major depressive disorder, recurrent severe without psychotic features: Secondary | ICD-10-CM | POA: Diagnosis not present

## 2022-12-05 NOTE — Therapy (Signed)
OUTPATIENT PHYSICAL THERAPY TREATMENT NOTE   Patient Name: Francisco Padilla MRN: [id number] DOB:Sep 13, 1950, 72 y.o., male Today's Date: 12/06/2022  PCP: Emilio Aspen, MD REFERRING PROVIDER: Emilio Aspen, MD   END OF SESSION:   PT End of Session - 12/06/22 1406     Visit Number 2    Number of Visits 7    Date for PT Re-Evaluation 01/01/23    Authorization Type MCR    Progress Note Due on Visit 10    PT Start Time 1402    PT Stop Time 1445    PT Time Calculation (min) 43 min    Activity Tolerance Patient tolerated treatment well    Behavior During Therapy WFL for tasks assessed/performed             Past Medical History:  Diagnosis Date   ADD (attention deficit disorder)    Anxiety 2007   Asthma as child   Asymptomatic gallstones    Atrial fibrillation (HCC)    Atrial flutter (HCC)    Depression    DJD (degenerative joint disease) of knee    Dysrhythmia    Chronic Atrial Fib- seeing Dr. Shela Commons [facility]   H/O peptic ulcer    past history with GI bleed- cauterization done throught scope   History of kidney stones    pt claims urology discovered a kidney stone last week 05/25/21   Hypercholesterolemia    Orthostatic hypotension    Pneumonia as child   Pneumothorax on left 05/20/2009   Prediabetes    none now   Pulmonary nodules    Sleep apnea    cpap set on 13   Thrombocytopenia (HCC)    pt denies   Thyromegaly    Varicose veins    Past Surgical History:  Procedure Laterality Date   BIOPSY  04/18/2018   Procedure: BIOPSY;  Surgeon: Kerin Salen, MD;  Location: [facility];  Service: Gastroenterology;;   ESOPHAGOGASTRODUODENOSCOPY N/A 04/18/2018   Procedure: ESOPHAGOGASTRODUODENOSCOPY (EGD);  Surgeon: Kerin Salen, MD;  Location: [facility];  Service: Gastroenterology;  Laterality: N/A;   ESOPHAGOGASTRODUODENOSCOPY (EGD) WITH PROPOFOL N/A 01/05/2015   Procedure: ESOPHAGOGASTRODUODENOSCOPY (EGD) WITH PROPOFOL;  Surgeon: Charolett Bumpers, MD;  Location: [facility];  Service: Endoscopy;  Laterality: N/A;   EXTRACORPOREAL SHOCK WAVE LITHOTRIPSY Left 08/11/2021   Procedure: LEFT EXTRACORPOREAL SHOCK WAVE LITHOTRIPSY (ESWL);  Surgeon: Jannifer Hick, MD;  Location: [facility];  Service: Urology;  Laterality: Left;   GASTRIC ROUX-EN-Y     '08-[name] (weight stable around 302 #   KNEE ARTHROSCOPY     NASAL SEPTOPLASTY W/ TURBINOPLASTY Bilateral 11/13/2022   Procedure: NASAL SEPTOPLASTY WITH BILATERAL TURBINATE REDUCTION;  Surgeon: Newman Pies, MD;  Location: [facility];  Service: ENT;  Laterality: Bilateral;   TONSILLECTOMY     TOTAL KNEE ARTHROPLASTY Left 06/03/2021   Procedure: LEFT TOTAL KNEE ARTHROPLASTY;  Surgeon: Nadara Mustard, MD;  Location: [facility] OR;  Service: Orthopedics;  Laterality: Left;   ULNAR NERVE TRANSPOSITION Right 15 yrs ago   Patient Active Problem List   Diagnosis Date Noted   Total knee replacement status, 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   Unilateral primary osteoarthritis, left knee    Longstanding persistent atrial fibrillation (HCC) 04/26/2021   Preop cardiovascular exam 04/26/2021   Orthostatic hypotension 02/20/2019   Chronic anticoagulation 03/05/2015   Abnormal chest x-ray with multiple lung nodules 12/14/2014   AS (aortic stenosis) 12/14/2014   Atrial flutter by electrocardiogram (HCC) 12/14/2014   CAD (  coronary artery disease) 12/14/2014   DJD (degenerative joint disease) 12/14/2014   History of GI bleed 12/14/2014   Nephrolithiasis 12/14/2014   Other activity(E029.9) 12/14/2014   Prediabetes 12/14/2014   Thrombocytopenia (HCC) 12/14/2014   Arterial blood pressure decreased 10/28/2014   Atrial fibrillation (HCC) 09/02/2014   Morbid obesity (HCC) 09/02/2014   AF (paroxysmal atrial fibrillation) (HCC) 08/03/2014   Gastroduodenal ulcer 08/03/2014   Apnea, sleep 08/03/2014   Glaucoma suspect 02/21/2013   Glaucoma suspect of both eyes 02/21/2013   Herpes 04/11/2012   Routine  general medical examination at a health care facility 04/11/2012   DYSLIPIDEMIA 05/31/2009   DEPRESSION/ANXIETY 05/31/2009   ATTENTION DEFICIT DISORDER 05/31/2009   Obstructive sleep apnea 05/31/2009   GERD 05/31/2009   GOUT, HX OF 05/31/2009    REFERRING DIAG: Piriformis syndrome   THERAPY DIAG:  Muscle weakness (generalized)  Repeated falls  Other abnormalities of gait and mobility  Rationale for Evaluation and Treatment Rehabilitation  PERTINENT HISTORY: See PMH above   PRECAUTIONS: Fall    SUBJECTIVE:                                                                                                                                                                                     SUBJECTIVE STATEMENT:  Patient reports that experience a few days of hemorrhaging secondary to nasal surgery following last visit. He does report some right knee stiffness and soreness due to the weather. He has been able to exercise twice since last visit.  PAIN:  Are you having pain? No:  NPRS scale: 0/10 Pain location: Right hip region   OBJECTIVE: (objective measures completed at initial evaluation unless otherwise dated) PATIENT SURVEYS:  FOTO 36% functional status    POSTURE:             Rounded shoulder posture, decreased lumbar lordosis  LOWER EXTREMITY MMT:     MMT Right eval Left eval Rt / Lt 12/06/2022  Hip flexion 4 4   Hip extension 4- 4-   Hip abduction 4- 4- 4- / 4-  Hip adduction       Hip internal rotation       Hip external rotation       Knee flexion 5 5   Knee extension 4+ 5 4+ / 5  Ankle dorsiflexion 5 5   Ankle plantarflexion       Ankle inversion       Ankle eversion        (Blank rows = not tested)   FUNCTIONAL TESTS:  5 times sit to stand: 15 seconds Berg Balance Scale: 41/56   GAIT: Distance walked: clinic Assistive device utilized: None Level  of assistance: Complete Independence Comments: Slight forward trunk lean, wide base with bilateral toe  out, occasionally becomes unsteady     TODAY'S TREATMENT:      [facility] Adult PT Treatment:                                                DATE: 12/06/2022 Therapeutic Exercise: NuStep L6 x 6 min with UE/LE while taking subjective and working on workload capacity SLR 2 x 10 each Bridge 3 x 10 Prone quad stretch 2 x 30 sec each Sidelying clamshell with red 2 x 15 each Sit to stand x 10 Leg press 60# 3 x 10   [facility] Adult PT Treatment:                                                DATE: 11/20/2022 Therapeutic Exercise: Bridge x 10 SLR x 10 Sidelying hip abduction x 10 Sit to stand x 10   PATIENT EDUCATION:  Education details: HEP Person educated: Patient Education method: Programmer, multimedia, Demonstration, Actor cues, Verbal cues Education comprehension: verbalized understanding, returned demonstration, verbal cues required, tactile cues required, and needs further education   HOME EXERCISE PROGRAM: VTHANHHD      ASSESSMENT: CLINICAL IMPRESSION: Patient tolerated therapy well with no adverse effects. Therapy focused primarily on progressing strength with good tolerance. He was able to progress with machine strength for LE without any increase in pain. Incorporated some quad stretch as he does exhibit muscular tightness. He does continues to exhibit gross strength deficits of the hip and right knee. No changes to HEP this visit. Patient would benefit from continued skilled PT to progress mobility and strength in order to reduce fall risk and maximize functional ability.     OBJECTIVE IMPAIRMENTS: Abnormal gait, decreased activity tolerance, decreased ROM, decreased strength, impaired flexibility, postural dysfunction, and pain.    ACTIVITY LIMITATIONS: lifting, bending, squatting, stairs, and locomotion level   PARTICIPATION LIMITATIONS: meal prep, cleaning, shopping, and community activity   PERSONAL FACTORS: Fitness, Past/current experiences, and Time since onset of  injury/illness/exacerbation are also affecting patient's functional outcome.      GOALS: Goals reviewed with patient? Yes   SHORT TERM GOALS: Target date: 12/11/2022   Patient will be I with initial HEP in order to progress with therapy. Baseline: HEP provided at eval Goal status: INITIAL   2.  Patient will demonstrate 5xSTS </= 12 sec in order to indicate improved LE strength and reduced fall risk  Baseline: 15 seconds Goal status: INITIAL   LONG TERM GOALS: Target date: 01/01/2023   Patient will be I with final HEP to maintain progress from PT. Baseline: HEP provided at eval Goal status: INITIAL   2.  Patient will report >/= 45% status on FOTO to indicate improved functional ability. Baseline: 36% functional status Goal status: INITIAL   3.  Patient will demonstrate hip strength >/= 4/5 MMT in order to improve  Baseline: 4-/5 MMT Goal status: INITIAL   4.  Patient will demonstrate BERG >/= 52/56 in order to indicate improved balance and reduced fall risk  Baseline: 41/56 Goal status: INITIAL     PLAN: PT FREQUENCY: 1x/week   PT DURATION: 6 weeks   PLANNED INTERVENTIONS: Therapeutic exercises,  Therapeutic activity, Neuromuscular re-education, Balance training, Gait training, Patient/Family education, Self Care, Joint mobilization, Joint manipulation, Aquatic Therapy, Dry Needling, Cryotherapy, Moist heat, Manual therapy, and Re-evaluation.   PLAN FOR NEXT SESSION: Review HEP and progress PRN, progress hip/LE strengthening and initiate balance training and postural strengthening   Rosana Hoes, PT, DPT, LAT, ATC 12/06/22  2:49 PM Phone: [phone] Fax: [phone]

## 2022-12-06 ENCOUNTER — Other Ambulatory Visit: Payer: Self-pay

## 2022-12-06 ENCOUNTER — Ambulatory Visit: Payer: Medicare Other | Attending: Internal Medicine | Admitting: Physical Therapy

## 2022-12-06 ENCOUNTER — Encounter: Payer: Self-pay | Admitting: Physical Therapy

## 2022-12-06 DIAGNOSIS — F332 Major depressive disorder, recurrent severe without psychotic features: Secondary | ICD-10-CM | POA: Diagnosis not present

## 2022-12-06 DIAGNOSIS — R2689 Other abnormalities of gait and mobility: Secondary | ICD-10-CM | POA: Insufficient documentation

## 2022-12-06 DIAGNOSIS — M6281 Muscle weakness (generalized): Secondary | ICD-10-CM | POA: Insufficient documentation

## 2022-12-06 DIAGNOSIS — R296 Repeated falls: Secondary | ICD-10-CM | POA: Insufficient documentation

## 2022-12-06 DIAGNOSIS — R5383 Other fatigue: Secondary | ICD-10-CM | POA: Diagnosis not present

## 2022-12-06 DIAGNOSIS — D62 Acute posthemorrhagic anemia: Secondary | ICD-10-CM | POA: Diagnosis not present

## 2022-12-07 ENCOUNTER — Encounter (HOSPITAL_COMMUNITY): Payer: Self-pay

## 2022-12-07 ENCOUNTER — Emergency Department (HOSPITAL_COMMUNITY)
Admission: EM | Admit: 2022-12-07 | Discharge: 2022-12-07 | Disposition: A | Discharge status: Home / Self Care | Payer: Medicare Other | Attending: Emergency Medicine | Admitting: Emergency Medicine

## 2022-12-07 ENCOUNTER — Other Ambulatory Visit: Payer: Self-pay

## 2022-12-07 ENCOUNTER — Emergency Department (HOSPITAL_COMMUNITY): Payer: Medicare Other

## 2022-12-07 DIAGNOSIS — Z7901 Long term (current) use of anticoagulants: Secondary | ICD-10-CM | POA: Insufficient documentation

## 2022-12-07 DIAGNOSIS — R531 Weakness: Secondary | ICD-10-CM | POA: Diagnosis not present

## 2022-12-07 DIAGNOSIS — I6782 Cerebral ischemia: Secondary | ICD-10-CM | POA: Diagnosis not present

## 2022-12-07 DIAGNOSIS — F332 Major depressive disorder, recurrent severe without psychotic features: Secondary | ICD-10-CM | POA: Diagnosis not present

## 2022-12-07 DIAGNOSIS — D649 Anemia, unspecified: Secondary | ICD-10-CM | POA: Diagnosis not present

## 2022-12-07 DIAGNOSIS — R799 Abnormal finding of blood chemistry, unspecified: Secondary | ICD-10-CM | POA: Diagnosis present

## 2022-12-07 DIAGNOSIS — R58 Hemorrhage, not elsewhere classified: Secondary | ICD-10-CM | POA: Diagnosis not present

## 2022-12-07 DIAGNOSIS — R42 Dizziness and giddiness: Secondary | ICD-10-CM | POA: Diagnosis not present

## 2022-12-07 LAB — CBC WITH DIFFERENTIAL/PLATELET
Abs Immature Granulocytes: 0.02 10*3/uL (ref 0.00–0.07)
Basophils Absolute: 0 10*3/uL (ref 0.0–0.1)
Basophils Relative: 0 %
Eosinophils Absolute: 0.2 10*3/uL (ref 0.0–0.5)
Eosinophils Relative: 4 %
HCT: 25.8 % — ABNORMAL LOW (ref 39.0–52.0)
Hemoglobin: 8 g/dL — ABNORMAL LOW (ref 13.0–17.0)
Immature Granulocytes: 0 %
Lymphocytes Relative: 20 %
Lymphs Abs: 1.3 10*3/uL (ref 0.7–4.0)
MCH: 28.3 pg (ref 26.0–34.0)
MCHC: 31 g/dL (ref 30.0–36.0)
MCV: 91.2 fL (ref 80.0–100.0)
Monocytes Absolute: 0.4 10*3/uL (ref 0.1–1.0)
Monocytes Relative: 7 %
Neutro Abs: 4.4 10*3/uL (ref 1.7–7.7)
Neutrophils Relative %: 69 %
Platelets: 295 10*3/uL (ref 150–400)
RBC: 2.83 MIL/uL — ABNORMAL LOW (ref 4.22–5.81)
RDW: 13.4 % (ref 11.5–15.5)
WBC: 6.4 10*3/uL (ref 4.0–10.5)
nRBC: 0 % (ref 0.0–0.2)

## 2022-12-07 LAB — COMPREHENSIVE METABOLIC PANEL
ALT: 15 U/L (ref 0–44)
AST: 17 U/L (ref 15–41)
Albumin: 3.5 g/dL (ref 3.5–5.0)
Alkaline Phosphatase: 54 U/L (ref 38–126)
Anion gap: 7 (ref 5–15)
BUN: 23 mg/dL (ref 8–23)
CO2: 26 mmol/L (ref 22–32)
Calcium: 8.9 mg/dL (ref 8.9–10.3)
Chloride: 105 mmol/L (ref 98–111)
Creatinine, Ser: 0.78 mg/dL (ref 0.61–1.24)
GFR, Estimated: >60 mL/min (ref >60)
Glucose, Bld: 108 mg/dL — ABNORMAL HIGH (ref 70–99)
Potassium: 3.7 mmol/L (ref 3.5–5.1)
Sodium: 138 mmol/L (ref 135–145)
Total Bilirubin: 0.5 mg/dL (ref 0.3–1.2)
Total Protein: 6 g/dL — ABNORMAL LOW (ref 6.5–8.1)

## 2022-12-07 LAB — PROTIME-INR
INR: 1.1 (ref 0.8–1.2)
Prothrombin Time: 14 seconds (ref 11.4–15.2)

## 2022-12-07 LAB — TYPE AND SCREEN
ABO/RH(D): O POS
Antibody Screen: NEGATIVE

## 2022-12-07 MED ORDER — ACETAMINOPHEN 325 MG PO TABS
650.0000 mg | ORAL_TABLET | Freq: Four times a day (QID) | ORAL | Status: DC | PRN
  Administered 2022-12-07: 650 mg via ORAL
  Filled 2022-12-07: qty 2

## 2022-12-07 NOTE — ED Notes (Addendum)
Pt ambulated down the hall after completing orthostatics with no complaints or deficits. Returned to bed and given fresh blanket.

## 2022-12-07 NOTE — ED Notes (Signed)
Placed patient in ER 26, patient admitted into monitor & placed on cardiac monitor, vital signs taken & RN notified of patient in room.

## 2022-12-07 NOTE — ED Provider Notes (Signed)
[facility]EM[facility]GENC[facility] DEPT [facility] Emergency Department Provider Note MRN:  [id number]  Arrival date & time: 12/07/22     Chief Complaint   Abnormal Lab   History of Present Illness   Francisco Padilla is a 72 y.o. year-old male presents to the ED with chief complaint of abnormal lab.  States that he was seen at [facility] clinic and referred to the ER for hemoglobin of 8.0.  Patient had nasal septum surgery a couple of weeks ago.  He held his eliquis for 5 days.  When he restarted the eliquis he had bleeding that lasted about 4 days.  Ultimately had packing placed by Dr. Suszanne Conners.  States that he has been using electromagnetic transcranial stimulation to treat depression. States that he felt worn out and fatigued, so he went to the [facility] yesterday and was notified that his hemoglobin was low.  History provided by patient.   Review of Systems  Pertinent positive and negative review of systems noted in HPI.    Physical Exam   Vitals:   12/07/22 2315 12/07/22 2322  BP: 139/83   Pulse: 78 88  Resp: 16   Temp: 98.5 F (36.9 C)   SpO2: 99% 96%    CONSTITUTIONAL:  non toxic-appearing, NAD NEURO:  Alert and oriented x 3, CN 3-12 grossly intact EYES:  eyes equal and reactive ENT/NECK:  Supple, no stridor  CARDIO:  normal rate, regular rhythm, appears well-perfused  PULM:  No respiratory distress, CTAB GI/GU:  non-distended,  MSK/SPINE:  No gross deformities, no edema, moves all extremities  SKIN:  no rash, atraumatic   *Additional and/or pertinent findings included in MDM below  Diagnostic and Interventional Summary    EKG Interpretation  Date/Time:    Ventricular Rate:    PR Interval:    QRS Duration:   QT Interval:    QTC Calculation:   R Axis:     Text Interpretation:         Labs Reviewed  COMPREHENSIVE METABOLIC PANEL - Abnormal; Notable for the following components:      Result Value   Glucose, Bld 108 (*)    Total Protein 6.0 (*)    All  other components within normal limits  CBC WITH DIFFERENTIAL/PLATELET - Abnormal; Notable for the following components:   RBC 2.83 (*)    Hemoglobin 8.0 (*)    HCT 25.8 (*)    All other components within normal limits  PROTIME-INR  TYPE AND SCREEN    CT HEAD WO CONTRAST ( )  Final Result      Medications  acetaminophen (TYLENOL) tablet 650 mg (has no administration in time range)     Procedures  /  Critical Care Procedures  ED Course and Medical Decision Making  I have reviewed the triage vital signs, the nursing notes, and pertinent available records from the EMR.  Social Determinants Affecting Complexity of Care: Patient has no clinically significant social determinants affecting this chief complaint..   ED Course:    Medical Decision Making Patient here with abnormal lab from [facility] in clinic yesterday.  Was told that his HGB was 8.0.  He ambulates without any irregular vitals.  No dizziness or SOB.  CT head was negative.  I suspect that the dizziness he has experienced in the past week or so is due to his anemia, but these symptoms seem to be improving and his anemia is stable.  I don't think he requires transfusion or admission.  Feel that  he is safe for discharge home.  Amount and/or Complexity of Data Reviewed Labs: ordered. Radiology: ordered.  Risk OTC drugs.     Consultants: No consultations were needed in caring for this patient.   Treatment and Plan: I considered admission due to patient's initial presentation, but after considering the examination and diagnostic results, patient will not require admission and can be discharged with outpatient follow-up.    Final Clinical Impressions(s) / ED Diagnoses     ICD-10-CM   1. Anemia, unspecified type  D64.9       ED Discharge Orders     None         Discharge Instructions Discussed with and Provided to Patient:     Discharge Instructions      Please follow-up with your  doctor.       Roxy Horseman, PA-C 12/07/22 2341    Sabas Sous, MD 12/08/22 0730

## 2022-12-07 NOTE — Discharge Instructions (Signed)
Please follow up with your doctor.

## 2022-12-07 NOTE — ED Triage Notes (Signed)
Pt arrived via [facility] from home. Pt has nose surgery two weeks ago and was having issues with bleeding afterwards for four days. The [facility] clinic told pt to come here for hemoglobin of 8.0. Pt c/o weakness and dizziness. Pt was on eliquis prior to nose surgery

## 2022-12-08 DIAGNOSIS — F332 Major depressive disorder, recurrent severe without psychotic features: Secondary | ICD-10-CM | POA: Diagnosis not present

## 2022-12-10 DIAGNOSIS — F32A Depression, unspecified: Secondary | ICD-10-CM | POA: Diagnosis not present

## 2022-12-10 DIAGNOSIS — Z79899 Other long term (current) drug therapy: Secondary | ICD-10-CM | POA: Diagnosis not present

## 2022-12-10 DIAGNOSIS — Z888 Allergy status to other drugs, medicaments and biological substances status: Secondary | ICD-10-CM | POA: Diagnosis not present

## 2022-12-10 DIAGNOSIS — I4891 Unspecified atrial fibrillation: Secondary | ICD-10-CM | POA: Diagnosis not present

## 2022-12-10 DIAGNOSIS — G629 Polyneuropathy, unspecified: Secondary | ICD-10-CM | POA: Diagnosis not present

## 2022-12-10 DIAGNOSIS — Z886 Allergy status to analgesic agent status: Secondary | ICD-10-CM | POA: Diagnosis not present

## 2022-12-10 DIAGNOSIS — Z885 Allergy status to narcotic agent status: Secondary | ICD-10-CM | POA: Diagnosis not present

## 2022-12-10 DIAGNOSIS — D62 Acute posthemorrhagic anemia: Secondary | ICD-10-CM | POA: Diagnosis not present

## 2022-12-10 DIAGNOSIS — Z7901 Long term (current) use of anticoagulants: Secondary | ICD-10-CM | POA: Diagnosis not present

## 2022-12-10 DIAGNOSIS — R531 Weakness: Secondary | ICD-10-CM | POA: Diagnosis not present

## 2022-12-10 DIAGNOSIS — D649 Anemia, unspecified: Secondary | ICD-10-CM | POA: Diagnosis not present

## 2022-12-11 ENCOUNTER — Telehealth: Payer: Self-pay | Admitting: Cardiology

## 2022-12-11 DIAGNOSIS — F332 Major depressive disorder, recurrent severe without psychotic features: Secondary | ICD-10-CM | POA: Diagnosis not present

## 2022-12-11 DIAGNOSIS — Z9889 Other specified postprocedural states: Secondary | ICD-10-CM | POA: Diagnosis not present

## 2022-12-11 DIAGNOSIS — D509 Iron deficiency anemia, unspecified: Secondary | ICD-10-CM | POA: Diagnosis not present

## 2022-12-11 DIAGNOSIS — R42 Dizziness and giddiness: Secondary | ICD-10-CM | POA: Diagnosis not present

## 2022-12-11 NOTE — Telephone Encounter (Signed)
Reviewed information with Dr Anne Fu who gives verbal orders for pt to restart Eliquis.  Spoke with pt and advised of Dr Anne Fu orders.  He states understanding and will restart Eliquis as ordered.  Pt reports seeing his pcp Dr Orson Aloe today.  He checked his iron levels and a stool guaiac, which was negative.  He will call back with any further questions or concerns.

## 2022-12-11 NOTE — Telephone Encounter (Signed)
New Message:         Patient said he had nose surgery 1 month ago today. He was off of his Eliquis for 6 days and  went back on it. Two days later he stated hemorrhaging in the night and bled for 4 days. He went to [facility] ER that night for 18 hrs. He was sent home, still bleeding. They could not stop it. He said his hemoglobin dropped to 8. He went to [facility] ER 2 times and the walk in clinic. He did not qualify for blood transfusion and iron infusion, because his insurance  would not cover it. He said last night he went to [facility] and his Hemoglobin was up to 9.1. He is having confusion and stumbling. He said he fell on Friday morning. His question is, when does he go back on his Eliquis?

## 2022-12-12 DIAGNOSIS — F332 Major depressive disorder, recurrent severe without psychotic features: Secondary | ICD-10-CM | POA: Diagnosis not present

## 2022-12-13 ENCOUNTER — Ambulatory Visit: Payer: Medicare Other | Admitting: Physical Therapy

## 2022-12-13 DIAGNOSIS — F332 Major depressive disorder, recurrent severe without psychotic features: Secondary | ICD-10-CM | POA: Diagnosis not present

## 2022-12-14 DIAGNOSIS — N200 Calculus of kidney: Secondary | ICD-10-CM | POA: Diagnosis not present

## 2022-12-14 DIAGNOSIS — F332 Major depressive disorder, recurrent severe without psychotic features: Secondary | ICD-10-CM | POA: Diagnosis not present

## 2022-12-15 DIAGNOSIS — F332 Major depressive disorder, recurrent severe without psychotic features: Secondary | ICD-10-CM | POA: Diagnosis not present

## 2022-12-19 DIAGNOSIS — F332 Major depressive disorder, recurrent severe without psychotic features: Secondary | ICD-10-CM | POA: Diagnosis not present

## 2022-12-20 DIAGNOSIS — F332 Major depressive disorder, recurrent severe without psychotic features: Secondary | ICD-10-CM | POA: Diagnosis not present

## 2022-12-21 ENCOUNTER — Ambulatory Visit: Payer: Medicare Other | Admitting: Physical Therapy

## 2022-12-21 DIAGNOSIS — G4733 Obstructive sleep apnea (adult) (pediatric): Secondary | ICD-10-CM | POA: Diagnosis not present

## 2022-12-21 DIAGNOSIS — F411 Generalized anxiety disorder: Secondary | ICD-10-CM | POA: Diagnosis not present

## 2022-12-21 DIAGNOSIS — F332 Major depressive disorder, recurrent severe without psychotic features: Secondary | ICD-10-CM | POA: Diagnosis not present

## 2022-12-22 ENCOUNTER — Ambulatory Visit (INDEPENDENT_AMBULATORY_CARE_PROVIDER_SITE_OTHER): Payer: Medicare Other | Admitting: Podiatry

## 2022-12-22 DIAGNOSIS — B351 Tinea unguium: Secondary | ICD-10-CM | POA: Diagnosis not present

## 2022-12-22 DIAGNOSIS — M79674 Pain in right toe(s): Secondary | ICD-10-CM | POA: Diagnosis not present

## 2022-12-22 DIAGNOSIS — Z7901 Long term (current) use of anticoagulants: Secondary | ICD-10-CM

## 2022-12-22 DIAGNOSIS — M79675 Pain in left toe(s): Secondary | ICD-10-CM | POA: Diagnosis not present

## 2022-12-22 DIAGNOSIS — F332 Major depressive disorder, recurrent severe without psychotic features: Secondary | ICD-10-CM | POA: Diagnosis not present

## 2022-12-25 DIAGNOSIS — F332 Major depressive disorder, recurrent severe without psychotic features: Secondary | ICD-10-CM | POA: Diagnosis not present

## 2022-12-25 NOTE — Progress Notes (Signed)
Subjective: Chief Complaint  Patient presents with   Nail Problem    RM 13 RFC bilateral nail trim.      72 y.o. returns the office today for painful, elongated, thickened toenails which he cannot trim himself.  He has been getting numbness still to his feet and he takes gabapentin to help with the neuropathy pain.   He has been having issues with bleeding/anemia and is scheduled for an iron infusion next week.  Francisco Aspen, MD  Objective: AAO 3, NAD DP/PT pulses palpable, CRT less than 3 seconds Sensation decreased. Nails hypertrophic, dystrophic, elongated, brittle, discolored 10. There is tenderness overlying the nails 1-5 bilaterally. There is no surrounding erythema or drainage along the nail sites.  The right hallux nail appears to have more yellow discoloration.  The other toenails. No pain with calf compression, swelling, warmth, erythema.  Assessment: Patient presents with symptomatic onychomycosis, neuropathy- on Eliquis  Plan: -Treatment options including alternatives, risks, complications were discussed -Nails sharply debrided 10 without complication/bleeding. -Continue gabapentin. -Monitor for any signs or symptoms of worsening neuropathy. -Discussed daily foot inspection. If there are any changes, to call the office immediately.  -Follow-up in 3 months or sooner if any problems are to arise. In the meantime, encouraged to call the office with any questions, concerns, changes symptoms.  Francisco Padilla, DPM

## 2022-12-26 DIAGNOSIS — D509 Iron deficiency anemia, unspecified: Secondary | ICD-10-CM | POA: Diagnosis not present

## 2022-12-26 DIAGNOSIS — K9049 Malabsorption due to intolerance, not elsewhere classified: Secondary | ICD-10-CM | POA: Diagnosis not present

## 2022-12-27 DIAGNOSIS — F411 Generalized anxiety disorder: Secondary | ICD-10-CM | POA: Diagnosis not present

## 2022-12-27 DIAGNOSIS — F332 Major depressive disorder, recurrent severe without psychotic features: Secondary | ICD-10-CM | POA: Diagnosis not present

## 2022-12-28 ENCOUNTER — Encounter: Payer: Self-pay | Admitting: Otolaryngology

## 2022-12-28 ENCOUNTER — Other Ambulatory Visit: Payer: Self-pay | Admitting: Otolaryngology

## 2022-12-28 ENCOUNTER — Ambulatory Visit: Payer: Medicare Other | Admitting: Physical Therapy

## 2022-12-28 DIAGNOSIS — F332 Major depressive disorder, recurrent severe without psychotic features: Secondary | ICD-10-CM | POA: Diagnosis not present

## 2022-12-28 DIAGNOSIS — J329 Chronic sinusitis, unspecified: Secondary | ICD-10-CM

## 2022-12-28 DIAGNOSIS — R519 Headache, unspecified: Secondary | ICD-10-CM

## 2023-01-01 DIAGNOSIS — D649 Anemia, unspecified: Secondary | ICD-10-CM | POA: Diagnosis not present

## 2023-01-02 DIAGNOSIS — F332 Major depressive disorder, recurrent severe without psychotic features: Secondary | ICD-10-CM | POA: Diagnosis not present

## 2023-01-03 DIAGNOSIS — F332 Major depressive disorder, recurrent severe without psychotic features: Secondary | ICD-10-CM | POA: Diagnosis not present

## 2023-01-03 DIAGNOSIS — N200 Calculus of kidney: Secondary | ICD-10-CM | POA: Diagnosis not present

## 2023-01-10 DIAGNOSIS — F332 Major depressive disorder, recurrent severe without psychotic features: Secondary | ICD-10-CM | POA: Diagnosis not present

## 2023-01-12 DIAGNOSIS — F332 Major depressive disorder, recurrent severe without psychotic features: Secondary | ICD-10-CM | POA: Diagnosis not present

## 2023-01-15 ENCOUNTER — Inpatient Hospital Stay: Admission: RE | Admit: 2023-01-15 | Payer: BLUE CROSS/BLUE SHIELD | Source: Ambulatory Visit

## 2023-01-16 NOTE — Therapy (Signed)
OUTPATIENT PHYSICAL THERAPY TREATMENT NOTE   Patient Name: Francisco Padilla MRN: [id number] DOB:[birth year], 72 y.o., male Today's Date: 01/17/2023  PCP: Emilio Aspen, MD REFERRING PROVIDER: Emilio Aspen, MD   END OF SESSION:   PT End of Session - 01/17/23 1323     Visit Number 3    Number of Visits 6    Date for PT Re-Evaluation 02/14/23    Authorization Type MCR    Progress Note Due on Visit 10    PT Start Time 1319    PT Stop Time 1400    PT Time Calculation (min) 41 min    Activity Tolerance Patient tolerated treatment well    Behavior During Therapy WFL for tasks assessed/performed              Past Medical History:  Diagnosis Date   ADD (attention deficit disorder)    Anxiety 2007   Asthma as child   Asymptomatic gallstones    Atrial fibrillation (HCC)    Atrial flutter (HCC)    Depression    DJD (degenerative joint disease) of knee    Dysrhythmia    Chronic Atrial Fib- seeing Dr. Shela Commons [facility]   H/O peptic ulcer    past history with GI bleed- cauterization done throught scope   History of kidney stones    pt claims urology discovered a kidney stone last week 05/25/21   Hypercholesterolemia    Orthostatic hypotension    Pneumonia as child   Pneumothorax on left 05/20/2009   Prediabetes    none now   Pulmonary nodules    Sleep apnea    cpap set on 13   Thrombocytopenia (HCC)    pt denies   Thyromegaly    Varicose veins    Past Surgical History:  Procedure Laterality Date   BIOPSY  04/18/2018   Procedure: BIOPSY;  Surgeon: Kerin Salen, MD;  Location: [facility];  Service: Gastroenterology;;   ESOPHAGOGASTRODUODENOSCOPY N/A 04/18/2018   Procedure: ESOPHAGOGASTRODUODENOSCOPY (EGD);  Surgeon: Kerin Salen, MD;  Location: [facility];  Service: Gastroenterology;  Laterality: N/A;   ESOPHAGOGASTRODUODENOSCOPY (EGD) WITH PROPOFOL N/A 01/05/2015   Procedure: ESOPHAGOGASTRODUODENOSCOPY (EGD) WITH PROPOFOL;  Surgeon: Charolett Bumpers, MD;  Location: [facility];  Service: Endoscopy;  Laterality: N/A;   EXTRACORPOREAL SHOCK WAVE LITHOTRIPSY Left 08/11/2021   Procedure: LEFT EXTRACORPOREAL SHOCK WAVE LITHOTRIPSY (ESWL);  Surgeon: Jannifer Hick, MD;  Location: [facility];  Service: Urology;  Laterality: Left;   GASTRIC ROUX-EN-Y     '08-[name] (weight stable around 302 #   KNEE ARTHROSCOPY     NASAL SEPTOPLASTY W/ TURBINOPLASTY Bilateral 11/13/2022   Procedure: NASAL SEPTOPLASTY WITH BILATERAL TURBINATE REDUCTION;  Surgeon: Newman Pies, MD;  Location: [facility];  Service: ENT;  Laterality: Bilateral;   TONSILLECTOMY     TOTAL KNEE ARTHROPLASTY Left 06/03/2021   Procedure: LEFT TOTAL KNEE ARTHROPLASTY;  Surgeon: Nadara Mustard, MD;  Location: [facility] OR;  Service: Orthopedics;  Laterality: Left;   ULNAR NERVE TRANSPOSITION Right 15 yrs ago   Patient Active Problem List   Diagnosis Date Noted   Total knee replacement status, 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   Unilateral primary osteoarthritis, left knee    Longstanding persistent atrial fibrillation (HCC) 04/26/2021   Preop cardiovascular exam 04/26/2021   Orthostatic hypotension 02/20/2019   Chronic anticoagulation 03/05/2015   Abnormal chest x-ray with multiple lung nodules 12/14/2014   AS (aortic stenosis) 12/14/2014   Atrial flutter by electrocardiogram (HCC) 12/14/2014  CAD (coronary artery disease) 12/14/2014   DJD (degenerative joint disease) 12/14/2014   History of GI bleed 12/14/2014   Nephrolithiasis 12/14/2014   Other activity(E029.9) 12/14/2014   Prediabetes 12/14/2014   Thrombocytopenia (HCC) 12/14/2014   Arterial blood pressure decreased 10/28/2014   Atrial fibrillation (HCC) 09/02/2014   Morbid obesity (HCC) 09/02/2014   AF (paroxysmal atrial fibrillation) (HCC) 08/03/2014   Gastroduodenal ulcer 08/03/2014   Apnea, sleep 08/03/2014   Glaucoma suspect 02/21/2013   Glaucoma suspect of both eyes 02/21/2013   Herpes 04/11/2012    Routine general medical examination at a health care facility 04/11/2012   DYSLIPIDEMIA 05/31/2009   DEPRESSION/ANXIETY 05/31/2009   ATTENTION DEFICIT DISORDER 05/31/2009   Obstructive sleep apnea 05/31/2009   GERD 05/31/2009   GOUT, HX OF 05/31/2009    REFERRING DIAG: Piriformis syndrome   THERAPY DIAG:  Muscle weakness (generalized)  Repeated falls  Other abnormalities of gait and mobility  Rationale for Evaluation and Treatment Rehabilitation  PERTINENT HISTORY: See PMH above   PRECAUTIONS: Fall    SUBJECTIVE:                                                                                                                                                                                     SUBJECTIVE STATEMENT:  Patient reports he has had to miss time due to having iron infusion and bleeding following nose surgery. He has started getting back to the gym to strengthening his right knee, and his neuropathy is still giving him trouble.   PAIN:  Are you having pain? NPRS scale: 0/10 Pain location: Right knee   OBJECTIVE: (objective measures completed at initial evaluation unless otherwise dated) PATIENT SURVEYS:  FOTO 36% functional status  01/17/2023:     POSTURE:             Rounded shoulder posture, decreased lumbar lordosis  LOWER EXTREMITY MMT:     MMT Right eval Left eval Rt / Lt 12/06/2022 Rt / Lt 01/17/2023  Hip flexion 4 4    Hip extension 4- 4-  4- / 4-  Hip abduction 4- 4- 4- / 4- 4- / 4-  Hip adduction        Hip internal rotation        Hip external rotation        Knee flexion 5 5    Knee extension 4+ 5 4+ / 5 4+ / 5  Ankle dorsiflexion 5 5    Ankle plantarflexion        Ankle inversion        Ankle eversion         (Blank rows = not tested)   FUNCTIONAL  TESTS:  5 times sit to stand: 15 seconds  01/17/2023:  Berg Balance Scale: 41/56 01/17/2023:   BERG BALANCE TEST Sitting to Standing: 4.      Stands without using hands and stabilize  independently Standing Unsupported: 4.      Stands safely for 2 minutes Sitting Unsupported: 4.     Sits for 2 minutes independently Standing to Sitting: 4.     Sits safely with minimal use of hands Transfers: 4.     Transfers safely with minor use of hands Standing with eyes closed: 3.     Stands 10 seconds with supervision Standing with feet together: 4.     Stands for 1 minute safely Reaching forward with outstretched arm: 4.     Reaches forward 10 inches Retrieving object from the floor: 4.      Able to pick up easily and safely Turning to look behind: 4.     Looks behind from both sides and weight shifts well Turning 360 degrees: 2.     Able to turn slowly, but safely Place alternate foot on stool: 3.     Completes 8 steps in >20 seconds Standing with one foot in front: 0.     Loses balance while standing/stepping Standing on one foot: 0.     Unable Total Score: 44/56  GAIT: Distance walked: clinic Assistive device utilized: None Level of assistance: Complete Independence Comments: Slight forward trunk lean, wide base with bilateral toe out, occasionally becomes unsteady     TODAY'S TREATMENT:      [facility] Adult PT Treatment:                                                DATE: 01/16/2023 Therapeutic Exercise: NuStep L6 x 6 min with UE/LE while taking subjective and working on workload capacity SLR 2 x 10 each Bridge 2 x 10 Sidelying hip abduction 2 x 10 Leg press (cybex) 60# 3 x 10 Therapeutic Activity: Reassessment of goals, BERG, FOTO, 5xSTS to determine functional progress   [facility] Adult PT Treatment:                                                DATE: 12/06/2022 Therapeutic Exercise: NuStep L6 x 6 min with UE/LE while taking subjective and working on workload capacity SLR 2 x 10 each Bridge 3 x 10 Prone quad stretch 2 x 30 sec each Sidelying clamshell with red 2 x 15 each Sit to stand x 10 Leg press 60# 3 x 10  [facility] Adult PT Treatment:                                                 DATE: 11/20/2022 Therapeutic Exercise: Bridge x 10 SLR x 10 Sidelying hip abduction x 10 Sit to stand x 10   PATIENT EDUCATION:  Education details: POC extension, HEP Person educated: Patient Education method: Explanation, Demonstration, Tactile cues, Verbal cues Education comprehension: verbalized understanding, returned demonstration, verbal cues required, tactile cues required, and needs further education   HOME EXERCISE PROGRAM: [facility]  ASSESSMENT: CLINICAL IMPRESSION: Patient tolerated therapy well with no adverse effects. He returns following about a 5-6 weeks gap in therapy due to other medical complications. He does report a reduction in his functional ability on FOTO and he reports feeling more fatigued, but he does demonstrate improvement in his balance measures and continued strength deficit of the hips and right knee. Therapy focused on LE strengthening and reviewing his HEP and gym based strengthening. No changes were made to his HEP this visit. Patient would benefit from continued skilled PT to progress mobility and strength in order to reduce fall risk and maximize functional ability, so will extend PT POC for 4 more weeks.     OBJECTIVE IMPAIRMENTS: Abnormal gait, decreased activity tolerance, decreased ROM, decreased strength, impaired flexibility, postural dysfunction, and pain.    ACTIVITY LIMITATIONS: lifting, bending, squatting, stairs, and locomotion level   PARTICIPATION LIMITATIONS: meal prep, cleaning, shopping, and community activity   PERSONAL FACTORS: Fitness, Past/current experiences, and Time since onset of injury/illness/exacerbation are also affecting patient's functional outcome.      GOALS: Goals reviewed with patient? Yes   SHORT TERM GOALS: Target date: = LTG   Patient will be I with initial HEP in order to progress with therapy. Baseline: HEP provided at eval 01/17/2023: independent Goal status: MET   2.  Patient will  demonstrate 5xSTS </= 12 sec in order to indicate improved LE strength and reduced fall risk  Baseline: 15 seconds 01/17/2023: 15 seconds Goal status: ONGOING   LONG TERM GOALS: Target date: 02/14/2023   Patient will be I with final HEP to maintain progress from PT. Baseline: HEP provided at eval 01/17/2023: progressing Goal status: ONGOING   2.  Patient will report >/= 45% status on FOTO to indicate improved functional ability. Baseline: 36% functional status 01/17/2023: 26% Goal status: ONGOING   3.  Patient will demonstrate hip strength >/= 4/5 MMT in order to improve  Baseline: 4-/5 MMT 01/17/2023: 4-/5 MMT Goal status: ONGOING   4.  Patient will demonstrate BERG >/= 52/56 in order to indicate improved balance and reduced fall risk  Baseline: 41/56 01/17/2023: 44/56 Goal status: ONGOING     PLAN: PT FREQUENCY: 1x/week   PT DURATION: 4 weeks   PLANNED INTERVENTIONS: Therapeutic exercises, Therapeutic activity, Neuromuscular re-education, Balance training, Gait training, Patient/Family education, Self Care, Joint mobilization, Joint manipulation, Aquatic Therapy, Dry Needling, Cryotherapy, Moist heat, Manual therapy, and Re-evaluation.   PLAN FOR NEXT SESSION: Review HEP and progress PRN, progress hip/LE strengthening and initiate balance training and postural strengthening   Rosana Hoes, PT, DPT, LAT, ATC 01/17/23  2:22 PM Phone: [phone] Fax: [phone]

## 2023-01-17 ENCOUNTER — Encounter: Payer: Self-pay | Admitting: Physical Therapy

## 2023-01-17 ENCOUNTER — Ambulatory Visit: Payer: Medicare Other | Attending: Internal Medicine | Admitting: Physical Therapy

## 2023-01-17 ENCOUNTER — Other Ambulatory Visit: Payer: Self-pay

## 2023-01-17 DIAGNOSIS — R2689 Other abnormalities of gait and mobility: Secondary | ICD-10-CM | POA: Insufficient documentation

## 2023-01-17 DIAGNOSIS — R296 Repeated falls: Secondary | ICD-10-CM | POA: Diagnosis not present

## 2023-01-17 DIAGNOSIS — M6281 Muscle weakness (generalized): Secondary | ICD-10-CM | POA: Diagnosis not present

## 2023-01-18 DIAGNOSIS — F332 Major depressive disorder, recurrent severe without psychotic features: Secondary | ICD-10-CM | POA: Diagnosis not present

## 2023-01-18 DIAGNOSIS — F411 Generalized anxiety disorder: Secondary | ICD-10-CM | POA: Diagnosis not present

## 2023-01-23 NOTE — Therapy (Signed)
OUTPATIENT PHYSICAL THERAPY TREATMENT NOTE   Patient Name: Francisco Padilla MRN: [id number] DOB:[birth year], 72 y.o., male Today's Date: 01/24/2023  PCP: Emilio Aspen, MD REFERRING PROVIDER: Emilio Aspen, MD   END OF SESSION:   PT End of Session - 01/24/23 1359     Visit Number 4    Number of Visits 6    Date for PT Re-Evaluation 02/14/23    Authorization Type MCR    Progress Note Due on Visit 10    PT Start Time 1400    PT Stop Time 1440    PT Time Calculation (min) 40 min    Activity Tolerance Patient tolerated treatment well    Behavior During Therapy [facility] for tasks assessed/performed               Past Medical History:  Diagnosis Date   ADD (attention deficit disorder)    Anxiety 2007   Asthma as child   Asymptomatic gallstones    Atrial fibrillation (HCC)    Atrial flutter (HCC)    Depression    DJD (degenerative joint disease) of knee    Dysrhythmia    Chronic Atrial Fib- seeing Dr. Shela Commons [facility]   H/O peptic ulcer    past history with GI bleed- cauterization done throught scope   History of kidney stones    pt claims urology discovered a kidney stone last week 05/25/21   Hypercholesterolemia    Orthostatic hypotension    Pneumonia as child   Pneumothorax on left 05/20/2009   Prediabetes    none now   Pulmonary nodules    Sleep apnea    cpap set on 13   Thrombocytopenia (HCC)    pt denies   Thyromegaly    Varicose veins    Past Surgical History:  Procedure Laterality Date   BIOPSY  04/18/2018   Procedure: BIOPSY;  Surgeon: Kerin Salen, MD;  Location: [facility];  Service: Gastroenterology;;   ESOPHAGOGASTRODUODENOSCOPY N/A 04/18/2018   Procedure: ESOPHAGOGASTRODUODENOSCOPY (EGD);  Surgeon: Kerin Salen, MD;  Location: [facility];  Service: Gastroenterology;  Laterality: N/A;   ESOPHAGOGASTRODUODENOSCOPY (EGD) WITH PROPOFOL N/A 01/05/2015   Procedure: ESOPHAGOGASTRODUODENOSCOPY (EGD) WITH PROPOFOL;  Surgeon: Charolett Bumpers, MD;  Location: [facility];  Service: Endoscopy;  Laterality: N/A;   EXTRACORPOREAL SHOCK WAVE LITHOTRIPSY Left 08/11/2021   Procedure: LEFT EXTRACORPOREAL SHOCK WAVE LITHOTRIPSY (ESWL);  Surgeon: Jannifer Hick, MD;  Location: [facility];  Service: Urology;  Laterality: Left;   GASTRIC ROUX-EN-Y     '08-[name] (weight stable around 302 #   KNEE ARTHROSCOPY     NASAL SEPTOPLASTY W/ TURBINOPLASTY Bilateral 11/13/2022   Procedure: NASAL SEPTOPLASTY WITH BILATERAL TURBINATE REDUCTION;  Surgeon: Newman Pies, MD;  Location: [facility];  Service: ENT;  Laterality: Bilateral;   TONSILLECTOMY     TOTAL KNEE ARTHROPLASTY Left 06/03/2021   Procedure: LEFT TOTAL KNEE ARTHROPLASTY;  Surgeon: Nadara Mustard, MD;  Location: [facility] OR;  Service: Orthopedics;  Laterality: Left;   ULNAR NERVE TRANSPOSITION Right 15 yrs ago   Patient Active Problem List   Diagnosis Date Noted   Total knee replacement status, 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   Unilateral primary osteoarthritis, left knee    Longstanding persistent atrial fibrillation (HCC) 04/26/2021   Preop cardiovascular exam 04/26/2021   Orthostatic hypotension 02/20/2019   Chronic anticoagulation 03/05/2015   Abnormal chest x-ray with multiple lung nodules 12/14/2014   AS (aortic stenosis) 12/14/2014   Atrial flutter by electrocardiogram (HCC) 12/14/2014  CAD (coronary artery disease) 12/14/2014   DJD (degenerative joint disease) 12/14/2014   History of GI bleed 12/14/2014   Nephrolithiasis 12/14/2014   Other activity(E029.9) 12/14/2014   Prediabetes 12/14/2014   Thrombocytopenia (HCC) 12/14/2014   Arterial blood pressure decreased 10/28/2014   Atrial fibrillation (HCC) 09/02/2014   Morbid obesity (HCC) 09/02/2014   AF (paroxysmal atrial fibrillation) (HCC) 08/03/2014   Gastroduodenal ulcer 08/03/2014   Apnea, sleep 08/03/2014   Glaucoma suspect 02/21/2013   Glaucoma suspect of both eyes 02/21/2013   Herpes 04/11/2012    Routine general medical examination at a health care facility 04/11/2012   DYSLIPIDEMIA 05/31/2009   DEPRESSION/ANXIETY 05/31/2009   ATTENTION DEFICIT DISORDER 05/31/2009   Obstructive sleep apnea 05/31/2009   GERD 05/31/2009   GOUT, HX OF 05/31/2009    REFERRING DIAG: Piriformis syndrome   THERAPY DIAG:  Muscle weakness (generalized)  Repeated falls  Other abnormalities of gait and mobility  Rationale for Evaluation and Treatment Rehabilitation  PERTINENT HISTORY: See PMH above   PRECAUTIONS: Fall    SUBJECTIVE:                                                                                                                                                                                     SUBJECTIVE STATEMENT:  Patient reports his hemoglobin is still low so he is exhausted and feeling confused. He states he has started going back to the gym and participating in pool aerobics.  PAIN:  Are you having pain? NPRS scale: 0/10 Pain location: Right knee   OBJECTIVE: (objective measures completed at initial evaluation unless otherwise dated) PATIENT SURVEYS:  FOTO 36% functional status  01/17/2023: 26%     POSTURE:             Rounded shoulder posture, decreased lumbar lordosis  LOWER EXTREMITY MMT:     MMT Right eval Left eval Rt / Lt 12/06/2022 Rt / Lt 01/17/2023  Hip flexion 4 4    Hip extension 4- 4-  4- / 4-  Hip abduction 4- 4- 4- / 4- 4- / 4-  Hip adduction        Hip internal rotation        Hip external rotation        Knee flexion 5 5    Knee extension 4+ 5 4+ / 5 4+ / 5  Ankle dorsiflexion 5 5    Ankle plantarflexion        Ankle inversion        Ankle eversion         (Blank rows = not tested)   FUNCTIONAL TESTS:  5 times sit to stand: 15 seconds  01/17/2023: 15 seconds Berg Balance Scale: 41/56 01/17/2023: 44/56  BERG BALANCE TEST Sitting to Standing: 4.      Stands without using hands and stabilize independently Standing Unsupported: 4.       Stands safely for 2 minutes Sitting Unsupported: 4.     Sits for 2 minutes independently Standing to Sitting: 4.     Sits safely with minimal use of hands Transfers: 4.     Transfers safely with minor use of hands Standing with eyes closed: 3.     Stands 10 seconds with supervision Standing with feet together: 4.     Stands for 1 minute safely Reaching forward with outstretched arm: 4.     Reaches forward 10 inches Retrieving object from the floor: 4.      Able to pick up easily and safely Turning to look behind: 4.     Looks behind from both sides and weight shifts well Turning 360 degrees: 2.     Able to turn slowly, but safely Place alternate foot on stool: 3.     Completes 8 steps in >20 seconds Standing with one foot in front: 0.     Loses balance while standing/stepping Standing on one foot: 0.     Unable Total Score: 44/56  GAIT: Distance walked: clinic Assistive device utilized: None Level of assistance: Complete Independence Comments: Slight forward trunk lean, wide base with bilateral toe out, occasionally becomes unsteady     TODAY'S TREATMENT:      [facility] Adult PT Treatment:                                                DATE: 01/24/2023 Therapeutic Exercise: NuStep L7 x 6 min with UE/LE while taking subjective and working on workload capacity Leg press (cybex) 80# 3 x 10 Hip abduction machine 25# 2 x 10 each Forward 8" step-up 2 x 10 each Standing heel raises 2 x 20 Tandem stance 3 x 30 sec each Knee extension machine 35# 3 x 10   [facility] Adult PT Treatment:                                                DATE: 01/16/2023 Therapeutic Exercise: NuStep L6 x 6 min with UE/LE while taking subjective and working on workload capacity SLR 2 x 10 each Bridge 2 x 10 Sidelying hip abduction 2 x 10 Leg press (cybex) 60# 3 x 10 Therapeutic Activity: Reassessment of goals, BERG, FOTO, 5xSTS to determine functional progress  [facility] Adult PT Treatment:                                                 DATE: 12/06/2022 Therapeutic Exercise: NuStep L6 x 6 min with UE/LE while taking subjective and working on workload capacity SLR 2 x 10 each Bridge 3 x 10 Prone quad stretch 2 x 30 sec each Sidelying clamshell with red 2 x 15 each Sit to stand x 10 Leg press 60# 3 x 10  [facility] Adult PT Treatment:  DATE: 11/20/2022 Therapeutic Exercise: Bridge x 10 SLR x 10 Sidelying hip abduction x 10 Sit to stand x 10   PATIENT EDUCATION:  Education details: HEP Person educated: Patient Education method: Explanation, Demonstration, Actor cues, Verbal cues Education comprehension: verbalized understanding, returned demonstration, verbal cues required, tactile cues required, and needs further education   HOME EXERCISE PROGRAM: [facility]      ASSESSMENT: CLINICAL IMPRESSION: Patient tolerated therapy well with no adverse effects. Therapy focused on progressing LE strengthening with good tolerance. Incorporated more machine and closed chain strengthening. He does report some right anterior knee discomfort with exercises and demonstrates a greater strength deficit of the right knee. He did well with tandem stance balance without need for UE support. No changes to HEP this visit. He was encouraged to continue gym exercises and pool workouts. Patient would benefit from continued skilled PT to progress mobility and strength in order to reduce fall risk and maximize functional ability.     OBJECTIVE IMPAIRMENTS: Abnormal gait, decreased activity tolerance, decreased ROM, decreased strength, impaired flexibility, postural dysfunction, and pain.    ACTIVITY LIMITATIONS: lifting, bending, squatting, stairs, and locomotion level   PARTICIPATION LIMITATIONS: meal prep, cleaning, shopping, and community activity   PERSONAL FACTORS: Fitness, Past/current experiences, and Time since onset of injury/illness/exacerbation are also affecting patient's  functional outcome.      GOALS: Goals reviewed with patient? Yes   SHORT TERM GOALS: Target date: = LTG   Patient will be I with initial HEP in order to progress with therapy. Baseline: HEP provided at eval 01/17/2023: independent Goal status: MET   2.  Patient will demonstrate 5xSTS </= 12 sec in order to indicate improved LE strength and reduced fall risk  Baseline: 15 seconds 01/17/2023: 15 seconds Goal status: ONGOING   LONG TERM GOALS: Target date: 02/14/2023   Patient will be I with final HEP to maintain progress from PT. Baseline: HEP provided at eval 01/17/2023: progressing Goal status: ONGOING   2.  Patient will report >/= 45% status on FOTO to indicate improved functional ability. Baseline: 36% functional status 01/17/2023: 26% Goal status: ONGOING   3.  Patient will demonstrate hip strength >/= 4/5 MMT in order to improve  Baseline: 4-/5 MMT 01/17/2023: 4-/5 MMT Goal status: ONGOING   4.  Patient will demonstrate BERG >/= 52/56 in order to indicate improved balance and reduced fall risk  Baseline: 41/56 01/17/2023: 44/56 Goal status: ONGOING     PLAN: PT FREQUENCY: 1x/week   PT DURATION: 4 weeks   PLANNED INTERVENTIONS: Therapeutic exercises, Therapeutic activity, Neuromuscular re-education, Balance training, Gait training, Patient/Family education, Self Care, Joint mobilization, Joint manipulation, Aquatic Therapy, Dry Needling, Cryotherapy, Moist heat, Manual therapy, and Re-evaluation.   PLAN FOR NEXT SESSION: Review HEP and progress PRN, progress hip/LE strengthening and initiate balance training and postural strengthening   Rosana Hoes, PT, DPT, LAT, ATC 01/24/23  2:51 PM Phone: [phone] Fax: [phone]

## 2023-01-24 ENCOUNTER — Encounter: Payer: Self-pay | Admitting: Physical Therapy

## 2023-01-24 ENCOUNTER — Other Ambulatory Visit: Payer: Self-pay

## 2023-01-24 ENCOUNTER — Ambulatory Visit: Payer: Medicare Other | Attending: Internal Medicine | Admitting: Physical Therapy

## 2023-01-24 DIAGNOSIS — M6281 Muscle weakness (generalized): Secondary | ICD-10-CM | POA: Diagnosis not present

## 2023-01-24 DIAGNOSIS — R2689 Other abnormalities of gait and mobility: Secondary | ICD-10-CM | POA: Diagnosis not present

## 2023-01-24 DIAGNOSIS — D509 Iron deficiency anemia, unspecified: Secondary | ICD-10-CM | POA: Diagnosis not present

## 2023-01-24 DIAGNOSIS — R296 Repeated falls: Secondary | ICD-10-CM | POA: Insufficient documentation

## 2023-01-30 NOTE — Therapy (Signed)
OUTPATIENT PHYSICAL THERAPY TREATMENT NOTE   Patient Name: Francisco Padilla MRN: [id number] DOB:[birth year], 72 y.o., male Today's Date: 01/30/2023  PCP: Emilio Aspen, MD REFERRING PROVIDER: Emilio Aspen, MD   END OF SESSION:       Past Medical History:  Diagnosis Date   ADD (attention deficit disorder)    Anxiety 2007   Asthma as child   Asymptomatic gallstones    Atrial fibrillation (HCC)    Atrial flutter (HCC)    Depression    DJD (degenerative joint disease) of knee    Dysrhythmia    Chronic Atrial Fib- seeing Dr. Shela [facility]   H/O peptic ulcer    past history with GI bleed- cauterization done throught scope   History of kidney stones    pt claims urology discovered a kidney stone last week 05/25/21   Hypercholesterolemia    Orthostatic hypotension    Pneumonia as child   Pneumothorax on left 05/20/2009   Prediabetes    none now   Pulmonary nodules    Sleep apnea    cpap set on 13   Thrombocytopenia (HCC)    pt denies   Thyromegaly    Varicose veins    Past Surgical History:  Procedure Laterality Date   BIOPSY  04/18/2018   Procedure: BIOPSY;  Surgeon: Kerin Salen, MD;  Location: [facility];  Service: Gastroenterology;;   ESOPHAGOGASTRODUODENOSCOPY N/A 04/18/2018   Procedure: ESOPHAGOGASTRODUODENOSCOPY (EGD);  Surgeon: Kerin Salen, MD;  Location: [facility];  Service: Gastroenterology;  Laterality: N/A;   ESOPHAGOGASTRODUODENOSCOPY (EGD) WITH PROPOFOL N/A 01/05/2015   Procedure: ESOPHAGOGASTRODUODENOSCOPY (EGD) WITH PROPOFOL;  Surgeon: Charolett Bumpers, MD;  Location: [facility];  Service: Endoscopy;  Laterality: N/A;   EXTRACORPOREAL SHOCK WAVE LITHOTRIPSY Left 08/11/2021   Procedure: LEFT EXTRACORPOREAL SHOCK WAVE LITHOTRIPSY (ESWL);  Surgeon: Jannifer Hick, MD;  Location: [facility];  Service: Urology;  Laterality: Left;   GASTRIC ROUX-EN-Y     '08-[name] (weight stable around 302 #   KNEE ARTHROSCOPY      NASAL SEPTOPLASTY W/ TURBINOPLASTY Bilateral 11/13/2022   Procedure: NASAL SEPTOPLASTY WITH BILATERAL TURBINATE REDUCTION;  Surgeon: Newman Pies, MD;  Location: [facility];  Service: ENT;  Laterality: Bilateral;   TONSILLECTOMY     TOTAL KNEE ARTHROPLASTY Left 06/03/2021   Procedure: LEFT TOTAL KNEE ARTHROPLASTY;  Surgeon: Nadara Mustard, MD;  Location: [facility] OR;  Service: Orthopedics;  Laterality: Left;   ULNAR NERVE TRANSPOSITION Right 15 yrs ago   Patient Active Problem List   Diagnosis Date Noted   Total knee replacement status, 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   Unilateral primary osteoarthritis, left knee    Longstanding persistent atrial fibrillation (HCC) 04/26/2021   Preop cardiovascular exam 04/26/2021   Orthostatic hypotension 02/20/2019   Chronic anticoagulation 03/05/2015   Abnormal chest x-ray with multiple lung nodules 12/14/2014   AS (aortic stenosis) 12/14/2014   Atrial flutter by electrocardiogram (HCC) 12/14/2014   CAD (coronary artery disease) 12/14/2014   DJD (degenerative joint disease) 12/14/2014   History of GI bleed 12/14/2014   Nephrolithiasis 12/14/2014   Other activity(E029.9) 12/14/2014   Prediabetes 12/14/2014   Thrombocytopenia (HCC) 12/14/2014   Arterial blood pressure decreased 10/28/2014   Atrial fibrillation (HCC) 09/02/2014   Morbid obesity (HCC) 09/02/2014   AF (paroxysmal atrial fibrillation) (HCC) 08/03/2014   Gastroduodenal ulcer 08/03/2014   Apnea, sleep 08/03/2014   Glaucoma suspect 02/21/2013   Glaucoma suspect of both eyes 02/21/2013   Herpes 04/11/2012   Routine general  medical examination at a health care facility 04/11/2012   DYSLIPIDEMIA 05/31/2009   DEPRESSION/ANXIETY 05/31/2009   ATTENTION DEFICIT DISORDER 05/31/2009   Obstructive sleep apnea 05/31/2009   GERD 05/31/2009   GOUT, HX OF 05/31/2009    REFERRING DIAG: Piriformis syndrome   THERAPY DIAG:  No diagnosis found.  Rationale for Evaluation and Treatment  Rehabilitation  PERTINENT HISTORY: See PMH above   PRECAUTIONS: Fall    SUBJECTIVE:                                                                                                                                                                                     SUBJECTIVE STATEMENT:  Patient reports his hemoglobin is still low so he is exhausted and feeling confused. He states he has started going back to the gym and participating in pool aerobics.  PAIN:  Are you having pain? NPRS scale: 0/10 Pain location: Right knee   OBJECTIVE: (objective measures completed at initial evaluation unless otherwise dated) PATIENT SURVEYS:  FOTO 36% functional status  01/17/2023: 26%     POSTURE:             Rounded shoulder posture, decreased lumbar lordosis  LOWER EXTREMITY MMT:     MMT Right eval Left eval Rt / Lt 12/06/2022 Rt / Lt 01/17/2023  Hip flexion 4 4    Hip extension 4- 4-  4- / 4-  Hip abduction 4- 4- 4- / 4- 4- / 4-  Hip adduction        Hip internal rotation        Hip external rotation        Knee flexion 5 5    Knee extension 4+ 5 4+ / 5 4+ / 5  Ankle dorsiflexion 5 5    Ankle plantarflexion        Ankle inversion        Ankle eversion         (Blank rows = not tested)   FUNCTIONAL TESTS:  5 times sit to stand: 15 seconds  01/17/2023: 15 seconds Berg Balance Scale: 41/56 01/17/2023: 44/56  BERG BALANCE TEST Sitting to Standing: 4.      Stands without using hands and stabilize independently Standing Unsupported: 4.      Stands safely for 2 minutes Sitting Unsupported: 4.     Sits for 2 minutes independently Standing to Sitting: 4.     Sits safely with minimal use of hands Transfers: 4.     Transfers safely with minor use of hands Standing with eyes closed: 3.     Stands 10 seconds with supervision Standing with feet together: 4.  Stands for 1 minute safely Reaching forward with outstretched arm: 4.     Reaches forward 10 inches Retrieving object from the  floor: 4.      Able to pick up easily and safely Turning to look behind: 4.     Looks behind from both sides and weight shifts well Turning 360 degrees: 2.     Able to turn slowly, but safely Place alternate foot on stool: 3.     Completes 8 steps in >20 seconds Standing with one foot in front: 0.     Loses balance while standing/stepping Standing on one foot: 0.     Unable Total Score: 44/56  GAIT: Distance walked: clinic Assistive device utilized: None Level of assistance: Complete Independence Comments: Slight forward trunk lean, wide base with bilateral toe out, occasionally becomes unsteady     TODAY'S TREATMENT:      [facility] Adult PT Treatment:                                                DATE: 01/31/2023 Therapeutic Exercise: NuStep L7 x 6 min with UE/LE while taking subjective and working on workload capacity Leg press (cybex) 80# 3 x 10 Hip abduction machine 25# 2 x 10 each Forward 8" step-up 2 x 10 each Standing heel raises 2 x 20 Tandem stance 3 x 30 sec each Knee extension machine 35# 3 x 10   [facility] Adult PT Treatment:                                                DATE: 01/24/2023 Therapeutic Exercise: NuStep L7 x 6 min with UE/LE while taking subjective and working on workload capacity Leg press (cybex) 80# 3 x 10 Hip abduction machine 25# 2 x 10 each Forward 8" step-up 2 x 10 each Standing heel raises 2 x 20 Tandem stance 3 x 30 sec each Knee extension machine 35# 3 x 10  [facility] Adult PT Treatment:                                                DATE: 01/16/2023 Therapeutic Exercise: NuStep L6 x 6 min with UE/LE while taking subjective and working on workload capacity SLR 2 x 10 each Bridge 2 x 10 Sidelying hip abduction 2 x 10 Leg press (cybex) 60# 3 x 10 Therapeutic Activity: Reassessment of goals, BERG, FOTO, 5xSTS to determine functional progress  [facility] Adult PT Treatment:                                                DATE: 12/06/2022 Therapeutic  Exercise: NuStep L6 x 6 min with UE/LE while taking subjective and working on workload capacity SLR 2 x 10 each Bridge 3 x 10 Prone quad stretch 2 x 30 sec each Sidelying clamshell with red 2 x 15 each Sit to stand x 10 Leg press 60# 3 x 10   PATIENT EDUCATION:  Education details: HEP  Person educated: Patient Education method: Explanation, Demonstration, Tactile cues, Verbal cues Education comprehension: verbalized understanding, returned demonstration, verbal cues required, tactile cues required, and needs further education   HOME EXERCISE PROGRAM: VTHANHHD      ASSESSMENT: CLINICAL IMPRESSION: Patient tolerated therapy well with no adverse effects. *** Patient would benefit from continued skilled PT to progress mobility and strength in order to reduce fall risk and maximize functional ability.  Therapy focused on progressing LE strengthening with good tolerance. Incorporated more machine and closed chain strengthening. He does report some right anterior knee discomfort with exercises and demonstrates a greater strength deficit of the right knee. He did well with tandem stance balance without need for UE support. No changes to HEP this visit. He was encouraged to continue gym exercises and pool workouts.      OBJECTIVE IMPAIRMENTS: Abnormal gait, decreased activity tolerance, decreased ROM, decreased strength, impaired flexibility, postural dysfunction, and pain.    ACTIVITY LIMITATIONS: lifting, bending, squatting, stairs, and locomotion level   PARTICIPATION LIMITATIONS: meal prep, cleaning, shopping, and community activity   PERSONAL FACTORS: Fitness, Past/current experiences, and Time since onset of injury/illness/exacerbation are also affecting patient's functional outcome.      GOALS: Goals reviewed with patient? Yes   SHORT TERM GOALS: Target date: = LTG   Patient will be I with initial HEP in order to progress with therapy. Baseline: HEP provided at eval 01/17/2023:  independent Goal status: MET   2.  Patient will demonstrate 5xSTS </= 12 sec in order to indicate improved LE strength and reduced fall risk  Baseline: 15 seconds 01/17/2023: 15 seconds Goal status: ONGOING   LONG TERM GOALS: Target date: 02/14/2023   Patient will be I with final HEP to maintain progress from PT. Baseline: HEP provided at eval 01/17/2023: progressing Goal status: ONGOING   2.  Patient will report >/= 45% status on FOTO to indicate improved functional ability. Baseline: 36% functional status 01/17/2023: 26% Goal status: ONGOING   3.  Patient will demonstrate hip strength >/= 4/5 MMT in order to improve  Baseline: 4-/5 MMT 01/17/2023: 4-/5 MMT Goal status: ONGOING   4.  Patient will demonstrate BERG >/= 52/56 in order to indicate improved balance and reduced fall risk  Baseline: 41/56 01/17/2023: 44/56 Goal status: ONGOING     PLAN: PT FREQUENCY: 1x/week   PT DURATION: 4 weeks   PLANNED INTERVENTIONS: Therapeutic exercises, Therapeutic activity, Neuromuscular re-education, Balance training, Gait training, Patient/Family education, Self Care, Joint mobilization, Joint manipulation, Aquatic Therapy, Dry Needling, Cryotherapy, Moist heat, Manual therapy, and Re-evaluation.   PLAN FOR NEXT SESSION: Review HEP and progress PRN, progress hip/LE strengthening and initiate balance training and postural strengthening   Rosana Hoes, PT, DPT, LAT, ATC 01/30/23  4:13 PM Phone: [phone] Fax: [phone]

## 2023-01-31 ENCOUNTER — Other Ambulatory Visit: Payer: Self-pay

## 2023-01-31 ENCOUNTER — Ambulatory Visit: Payer: Medicare Other | Admitting: Physical Therapy

## 2023-01-31 ENCOUNTER — Encounter: Payer: Self-pay | Admitting: Physical Therapy

## 2023-01-31 ENCOUNTER — Telehealth: Payer: Self-pay | Admitting: Podiatry

## 2023-01-31 DIAGNOSIS — M6281 Muscle weakness (generalized): Secondary | ICD-10-CM

## 2023-01-31 DIAGNOSIS — R2689 Other abnormalities of gait and mobility: Secondary | ICD-10-CM

## 2023-01-31 DIAGNOSIS — R296 Repeated falls: Secondary | ICD-10-CM

## 2023-01-31 NOTE — Telephone Encounter (Signed)
Pt came in stating his gabapentin is refilled by his pcp. He would like for his prescription to be refilled by Dr. Ardelle Anton. Patient states the nerve in his leg is starting to hurt and he hasn't taken his medication in 2 weeks. Pt would like to know the best course of action to refill medication.

## 2023-02-05 DIAGNOSIS — L02412 Cutaneous abscess of left axilla: Secondary | ICD-10-CM | POA: Diagnosis not present

## 2023-02-08 ENCOUNTER — Ambulatory Visit: Payer: Medicare Other | Admitting: Physical Therapy

## 2023-02-08 DIAGNOSIS — F411 Generalized anxiety disorder: Secondary | ICD-10-CM | POA: Diagnosis not present

## 2023-02-08 DIAGNOSIS — Z711 Person with feared health complaint in whom no diagnosis is made: Secondary | ICD-10-CM | POA: Diagnosis not present

## 2023-02-08 DIAGNOSIS — R479 Unspecified speech disturbances: Secondary | ICD-10-CM | POA: Diagnosis not present

## 2023-02-08 DIAGNOSIS — R410 Disorientation, unspecified: Secondary | ICD-10-CM | POA: Diagnosis not present

## 2023-02-08 DIAGNOSIS — F332 Major depressive disorder, recurrent severe without psychotic features: Secondary | ICD-10-CM | POA: Diagnosis not present

## 2023-02-09 ENCOUNTER — Telehealth: Payer: Self-pay | Admitting: Cardiology

## 2023-02-09 NOTE — Telephone Encounter (Signed)
Patient calling to get a referral to a neurologist from dr skains. Please advise

## 2023-02-09 NOTE — Telephone Encounter (Signed)
Will forward to Dr Anne Fu for recommendations .Francisco Padilla

## 2023-02-12 NOTE — Telephone Encounter (Signed)
Francisco Bathe, MD  to [facility] Triage     02/12/23  6:43 AM Recommend [place] neurolog[facility]. Would ask him to contact Dr. Orson Aloe, his primary care physician for referral of this nature.  Thank you.    Patient called back. Informed him of Dr. Anne Fu advisement. Patient verbalized understanding.

## 2023-02-12 NOTE — Telephone Encounter (Signed)
Left message for patient to call back  

## 2023-02-15 ENCOUNTER — Encounter: Payer: Self-pay | Admitting: Physical Therapy

## 2023-02-15 ENCOUNTER — Ambulatory Visit: Payer: Medicare Other | Admitting: Physical Therapy

## 2023-02-15 ENCOUNTER — Other Ambulatory Visit: Payer: Self-pay

## 2023-02-15 DIAGNOSIS — R2689 Other abnormalities of gait and mobility: Secondary | ICD-10-CM | POA: Diagnosis not present

## 2023-02-15 DIAGNOSIS — M6281 Muscle weakness (generalized): Secondary | ICD-10-CM

## 2023-02-15 DIAGNOSIS — R296 Repeated falls: Secondary | ICD-10-CM

## 2023-02-15 DIAGNOSIS — R413 Other amnesia: Secondary | ICD-10-CM | POA: Diagnosis not present

## 2023-02-15 NOTE — Therapy (Signed)
OUTPATIENT PHYSICAL THERAPY TREATMENT NOTE  DISCHARGE   Patient Name: Francisco Padilla MRN: [id number] DOB:17-Nov-1950, 72 y.o., male Today's Date: 02/15/2023  PCP: Emilio Aspen, MD REFERRING PROVIDER: Emilio Aspen, MD   END OF SESSION:   PT End of Session - 02/15/23 1411     Visit Number 6    Number of Visits 6    Date for PT Re-Evaluation 02/15/23    Authorization Type MCR    Progress Note Due on Visit 10    PT Start Time 1405    PT Stop Time 1445    PT Time Calculation (min) 40 min    Activity Tolerance Patient tolerated treatment well    Behavior During Therapy [facility] for tasks assessed/performed                 Past Medical History:  Diagnosis Date   ADD (attention deficit disorder)    Anxiety 2007   Asthma as child   Asymptomatic gallstones    Atrial fibrillation (HCC)    Atrial flutter (HCC)    Depression    DJD (degenerative joint disease) of knee    Dysrhythmia    Chronic Atrial Fib- seeing Dr. Shela Commons [facility]   H/O peptic ulcer    past history with GI bleed- cauterization done throught scope   History of kidney stones    pt claims urology discovered a kidney stone last week 05/25/21   Hypercholesterolemia    Orthostatic hypotension    Pneumonia as child   Pneumothorax on left 05/20/2009   Prediabetes    none now   Pulmonary nodules    Sleep apnea    cpap set on 13   Thrombocytopenia (HCC)    pt denies   Thyromegaly    Varicose veins    Past Surgical History:  Procedure Laterality Date   BIOPSY  04/18/2018   Procedure: BIOPSY;  Surgeon: Kerin Salen, MD;  Location: [facility];  Service: Gastroenterology;;   ESOPHAGOGASTRODUODENOSCOPY N/A 04/18/2018   Procedure: ESOPHAGOGASTRODUODENOSCOPY (EGD);  Surgeon: Kerin Salen, MD;  Location: [facility];  Service: Gastroenterology;  Laterality: N/A;   ESOPHAGOGASTRODUODENOSCOPY (EGD) WITH PROPOFOL N/A 01/05/2015   Procedure: ESOPHAGOGASTRODUODENOSCOPY (EGD) WITH PROPOFOL;   Surgeon: Charolett Bumpers, MD;  Location: [facility];  Service: Endoscopy;  Laterality: N/A;   EXTRACORPOREAL SHOCK WAVE LITHOTRIPSY Left 08/11/2021   Procedure: LEFT EXTRACORPOREAL SHOCK WAVE LITHOTRIPSY (ESWL);  Surgeon: Jannifer Hick, MD;  Location: [facility];  Service: Urology;  Laterality: Left;   GASTRIC ROUX-EN-Y     '08-[name] (weight stable around 302 #   KNEE ARTHROSCOPY     NASAL SEPTOPLASTY W/ TURBINOPLASTY Bilateral 11/13/2022   Procedure: NASAL SEPTOPLASTY WITH BILATERAL TURBINATE REDUCTION;  Surgeon: Newman Pies, MD;  Location: [facility];  Service: ENT;  Laterality: Bilateral;   TONSILLECTOMY     TOTAL KNEE ARTHROPLASTY Left 06/03/2021   Procedure: LEFT TOTAL KNEE ARTHROPLASTY;  Surgeon: Nadara Mustard, MD;  Location: [facility] OR;  Service: Orthopedics;  Laterality: Left;   ULNAR NERVE TRANSPOSITION Right 15 yrs ago   Patient Active Problem List   Diagnosis Date Noted   Total knee replacement status, 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   Unilateral primary osteoarthritis, left knee    Longstanding persistent atrial fibrillation (HCC) 04/26/2021   Preop cardiovascular exam 04/26/2021   Orthostatic hypotension 02/20/2019   Chronic anticoagulation 03/05/2015   Abnormal chest x-ray with multiple lung nodules 12/14/2014   AS (aortic stenosis) 12/14/2014   Atrial flutter by  electrocardiogram (HCC) 12/14/2014   CAD (coronary artery disease) 12/14/2014   DJD (degenerative joint disease) 12/14/2014   History of GI bleed 12/14/2014   Nephrolithiasis 12/14/2014   Other activity(E029.9) 12/14/2014   Prediabetes 12/14/2014   Thrombocytopenia (HCC) 12/14/2014   Arterial blood pressure decreased 10/28/2014   Atrial fibrillation (HCC) 09/02/2014   Morbid obesity (HCC) 09/02/2014   AF (paroxysmal atrial fibrillation) (HCC) 08/03/2014   Gastroduodenal ulcer 08/03/2014   Apnea, sleep 08/03/2014   Glaucoma suspect 02/21/2013   Glaucoma suspect of both eyes 02/21/2013   Herpes  04/11/2012   Routine general medical examination at a health care facility 04/11/2012   DYSLIPIDEMIA 05/31/2009   DEPRESSION/ANXIETY 05/31/2009   ATTENTION DEFICIT DISORDER 05/31/2009   Obstructive sleep apnea 05/31/2009   GERD 05/31/2009   GOUT, HX OF 05/31/2009    REFERRING DIAG: Piriformis syndrome   THERAPY DIAG:  Muscle weakness (generalized)  Repeated falls  Other abnormalities of gait and mobility  Rationale for Evaluation and Treatment Rehabilitation  PERTINENT HISTORY: See PMH above   PRECAUTIONS: Fall    SUBJECTIVE:                                                                                                                                                                                     SUBJECTIVE STATEMENT:  Patient reports he is doing good. He has been having a little pain in the right knee that could be from the weather. He does not continued hemoglobin issues.  PAIN:  Are you having pain? NPRS scale: 0/10 Pain location: Right knee   OBJECTIVE: (objective measures completed at initial evaluation unless otherwise dated) PATIENT SURVEYS:  FOTO 36% functional status  01/17/2023: 26%   02/15/2023: 43%    POSTURE:             Rounded shoulder posture, decreased lumbar lordosis  LOWER EXTREMITY MMT:     MMT Right eval Left eval Rt / Lt 12/06/2022 Rt / Lt 01/17/2023 Rt / Lt 01/31/2023 Rt / Lt 02/15/2023  Hip flexion 4 4      Hip extension 4- 4-  4- / 4-  4 / 4  Hip abduction 4- 4- 4- / 4- 4- / 4- 4 / 4 4 / 4  Hip adduction          Hip internal rotation          Hip external rotation          Knee flexion 5 5      Knee extension 4+ 5 4+ / 5 4+ / 5  4+ / 5  Ankle dorsiflexion 5 5      Ankle plantarflexion  Ankle inversion          Ankle eversion           (Blank rows = not tested)   FUNCTIONAL TESTS:  5 times sit to stand: 15 seconds  01/17/2023: 15 seconds  02/15/2023: 9 seconds Berg Balance Scale: 41/56 01/17/2023: 44/56 02/15/2023:  55/56  BERG BALANCE TEST Sitting to Standing: 4.      Stands without using hands and stabilize independently Standing Unsupported: 4.      Stands safely for 2 minutes Sitting Unsupported: 4.     Sits for 2 minutes independently Standing to Sitting: 4.     Sits safely with minimal use of hands Transfers: 4.     Transfers safely with minor use of hands Standing with eyes closed: 4.     Stands safely for 10 seconds  Standing with feet together: 4.     Stands for 1 minute safely Reaching forward with outstretched arm: 4.     Reaches forward 10 inches Retrieving object from the floor: 4.      Able to pick up easily and safely Turning to look behind: 4.     Looks behind from both sides and weight shifts well Turning 360 degrees: 4.     Able to turn in </=4 seconds  Place alternate foot on stool: 3.     Completes 8 steps in >20 seconds Standing with one foot in front: 4.     Independent tandem for 30 seconds  Standing on one foot: 4.     Holds >10 seconds Total Score: 55/56  GAIT: Distance walked: clinic Assistive device utilized: None Level of assistance: Complete Independence Comments: Slight forward trunk lean, wide base with bilateral toe out, occasionally becomes unsteady     TODAY'S TREATMENT:      [facility] Adult PT Treatment:                                                DATE: 02/15/2023 Therapeutic Exercise: NuStep L7 x 6 min with UE/LE while taking subjective and working on workload capacity Leg press (cybex) DL 161# 2 x 10, SL 09# 2 x 10 each Knee extension machine 35# 2 x 10 Knee flexion machine 35# 2 x 10 Therapeutic Activity: Reassessment of goals consisting of FOTO, BERG, 5xSTS, MMT   [facility] Adult PT Treatment:                                                DATE: 01/31/2023 Therapeutic Exercise: NuStep L7 x 6 min with UE/LE while taking subjective and working on workload capacity Knee extension machine 35# 3 x 10 Knee flexion machine 35# 3 x 10 Leg press (cybex) DL 60# 2 x  12, SL 45# 2 x 8 each Sidelying hip abduction with 2# 2 x 15 each  [facility] Adult PT Treatment:                                                DATE: 01/24/2023 Therapeutic Exercise: NuStep L7 x 6 min with UE/LE while taking subjective and working on workload  capacity Leg press (cybex) 80# 3 x 10 Hip abduction machine 25# 2 x 10 each Forward 8" step-up 2 x 10 each Standing heel raises 2 x 20 Tandem stance 3 x 30 sec each Knee extension machine 35# 3 x 10  [facility] Adult PT Treatment:                                                DATE: 01/16/2023 Therapeutic Exercise: NuStep L6 x 6 min with UE/LE while taking subjective and working on workload capacity SLR 2 x 10 each Bridge 2 x 10 Sidelying hip abduction 2 x 10 Leg press (cybex) 60# 3 x 10 Therapeutic Activity: Reassessment of goals, BERG, FOTO, 5xSTS to determine functional progress  [facility] Adult PT Treatment:                                                DATE: 12/06/2022 Therapeutic Exercise: NuStep L6 x 6 min with UE/LE while taking subjective and working on workload capacity SLR 2 x 10 each Bridge 3 x 10 Prone quad stretch 2 x 30 sec each Sidelying clamshell with red 2 x 15 each Sit to stand x 10 Leg press 60# 3 x 10   PATIENT EDUCATION:  Education details: POC discharge, HEP, continuing with gym exercise and water aerobics Person educated: Patient Education method: Explanation Education comprehension: Verbalized understanding   HOME EXERCISE PROGRAM: VTHANHHD      ASSESSMENT: CLINICAL IMPRESSION: Patient tolerated therapy well with no adverse effects. He has nearly achieved all established goals, demonstrating improvement in his strength, balance, and mobility. He does report a slight limitation on his FOTO. At this time patient will be discharged from PT.     OBJECTIVE IMPAIRMENTS: Abnormal gait, decreased activity tolerance, decreased ROM, decreased strength, impaired flexibility, postural dysfunction, and pain.     ACTIVITY LIMITATIONS: lifting, bending, squatting, stairs, and locomotion level   PARTICIPATION LIMITATIONS: meal prep, cleaning, shopping, and community activity   PERSONAL FACTORS: Fitness, Past/current experiences, and Time since onset of injury/illness/exacerbation are also affecting patient's functional outcome.      GOALS: Goals reviewed with patient? Yes   SHORT TERM GOALS: Target date: = LTG   Patient will be I with initial HEP in order to progress with therapy. Baseline: HEP provided at eval 01/17/2023: independent Goal status: MET   2.  Patient will demonstrate 5xSTS </= 12 sec in order to indicate improved LE strength and reduced fall risk  Baseline: 15 seconds 01/17/2023: 15 seconds 02/15/2023: 9 seconds Goal status: MET   LONG TERM GOALS: Target date: 02/15/2023   Patient will be I with final HEP to maintain progress from PT. Baseline: HEP provided at eval 01/17/2023: progressing 02/15/2023: independent Goal status: MET   2.  Patient will report >/= 45% status on FOTO to indicate improved functional ability. Baseline: 36% functional status 01/17/2023: 26% 02/15/2023: 43% Goal status: PARTIALLY MET   3.  Patient will demonstrate hip strength >/= 4/5 MMT in order to improve  Baseline: 4-/5 MMT 01/17/2023: 4-/5 MMT 02/15/2023: 4/5 MMT Goal status: MET   4.  Patient will demonstrate BERG >/= 52/56 in order to indicate improved balance and reduced fall risk  Baseline: 41/56 01/17/2023: 44/56 02/15/2023: 55/56  Goal status: MET     PLAN: PT FREQUENCY: 1x/week   PT DURATION: 4 weeks   PLANNED INTERVENTIONS: Therapeutic exercises, Therapeutic activity, Neuromuscular re-education, Balance training, Gait training, Patient/Family education, Self Care, Joint mobilization, Joint manipulation, Aquatic Therapy, Dry Needling, Cryotherapy, Moist heat, Manual therapy, and Re-evaluation.   PLAN FOR NEXT SESSION: NA - discharge   Rosana Hoes, PT, DPT, LAT, ATC 02/15/23   2:46 PM Phone: [phone] Fax: [phone]    PHYSICAL THERAPY DISCHARGE SUMMARY  Visits from Start of Care: 6  Current functional level related to goals / functional outcomes: See above   Remaining deficits: See above   Education / Equipment: HEP   Patient agrees to discharge. Patient goals were partially met. Patient is being discharged due to being pleased with the current functional level.

## 2023-02-22 ENCOUNTER — Ambulatory Visit (INDEPENDENT_AMBULATORY_CARE_PROVIDER_SITE_OTHER): Payer: Medicare Other | Admitting: Podiatry

## 2023-02-22 DIAGNOSIS — M79675 Pain in left toe(s): Secondary | ICD-10-CM

## 2023-02-22 DIAGNOSIS — M79674 Pain in right toe(s): Secondary | ICD-10-CM | POA: Diagnosis not present

## 2023-02-22 DIAGNOSIS — B351 Tinea unguium: Secondary | ICD-10-CM

## 2023-02-22 DIAGNOSIS — Z7689 Persons encountering health services in other specified circumstances: Secondary | ICD-10-CM

## 2023-02-22 DIAGNOSIS — F411 Generalized anxiety disorder: Secondary | ICD-10-CM | POA: Diagnosis not present

## 2023-02-22 DIAGNOSIS — F332 Major depressive disorder, recurrent severe without psychotic features: Secondary | ICD-10-CM | POA: Diagnosis not present

## 2023-02-28 ENCOUNTER — Telehealth: Payer: Self-pay | Admitting: Neurology

## 2023-02-28 ENCOUNTER — Ambulatory Visit (INDEPENDENT_AMBULATORY_CARE_PROVIDER_SITE_OTHER): Payer: Medicare Other | Admitting: Neurology

## 2023-02-28 ENCOUNTER — Encounter: Payer: Self-pay | Admitting: Neurology

## 2023-02-28 VITALS — BP 114/78 | HR 71 | Ht 75.0 in | Wt 215.0 lb

## 2023-02-28 DIAGNOSIS — Z9884 Bariatric surgery status: Secondary | ICD-10-CM | POA: Insufficient documentation

## 2023-02-28 DIAGNOSIS — F32A Depression, unspecified: Secondary | ICD-10-CM | POA: Diagnosis not present

## 2023-02-28 DIAGNOSIS — G4733 Obstructive sleep apnea (adult) (pediatric): Secondary | ICD-10-CM

## 2023-02-28 DIAGNOSIS — Z114 Encounter for screening for human immunodeficiency virus [HIV]: Secondary | ICD-10-CM

## 2023-02-28 DIAGNOSIS — R413 Other amnesia: Secondary | ICD-10-CM | POA: Diagnosis not present

## 2023-02-28 DIAGNOSIS — Z7252 High risk homosexual behavior: Secondary | ICD-10-CM

## 2023-02-28 NOTE — Telephone Encounter (Signed)
medicare/BCBS sup NPR sent to GI [phone] 

## 2023-02-28 NOTE — Progress Notes (Signed)
Chief Complaint  Patient presents with   New Patient (Initial Visit)    Rm 14. Patient alone. Patient reports speech delays and trouble sleeping, now having memory concerns.       ASSESSMENT AND PLAN  Francisco Padilla is a 72 y.o. male   Mild cognitive impairment History of bariatric surgery with more than 180 pound weight loss Long history of obstructive sleep apnea,  His complaints of memory loss can be multifactorial, including stress, poor sleep quality, chronic insomnia, worsening depression anxiety, CT also showed chronic small vessel disease,  MRI of the brain  Laboratory evaluation for treatable etiology  DIAGNOSTIC DATA (LABS, IMAGING, TESTING) - I reviewed patient records, labs, notes, testing and imaging myself where available.   MEDICAL HISTORY:  Francisco Padilla is a 72 year old male,, seen in request by her primary care physician from [facility] Dr.   Reubin Milan, for evaluation of memory loss, initial evaluation was February 28, 2023    I reviewed and summarized the referring note. PMHX. Atrial fibrillation, on eliquis Depression, anxiety HLD ADD Chronic Insomnia S/p Bariatric surgery in Sept 2008, lost weight 180 Lb  He reported significant stress over the past few years, he lost his partner of more than 49 years in November 2023, prior to that, he was the main caregiver of him who suffered stroke for more than 11 years, since then, he had experienced many personal medical issues, stress of making decision alone, he now lives alone at home, managing multiple property, farm  He noticed mental claudication, worsening depression anxiety," which has been present since I was born", difficulty focusing, MoCA examination 28/30 today, also reported long history of sleep apnea, has been compliant with his CPAP machine,  Personally reviewed CT head in May 2024: 1. No CT evidence for acute intracranial abnormality. 2. Atrophy and minimal chronic small vessel ischemic changes  of the white matter.  PHYSICAL EXAM:   Vitals:   02/28/23 1404  BP: 114/78  Pulse: 71  Weight: 215 lb (97.5 kg)  Height: 6\' 3"  (1.905 m)   Body mass index is 26.87 kg/m.  PHYSICAL EXAMNIATION:  Gen: NAD, conversant, well nourised, well groomed                     Cardiovascular: Regular rate rhythm, no peripheral edema, warm, nontender. Eyes: Conjunctivae clear without exudates or hemorrhage Neck: Supple, no carotid bruits. Pulmonary: Clear to auscultation bilaterally   NEUROLOGICAL EXAM:  MENTAL STATUS: Speech/cognition: Awake, alert, oriented to history taking and casual conversation    02/28/2023    2:12 PM  Montreal Cognitive Assessment   Visuospatial/ Executive (0/5) 4  Naming (0/3) 2  Attention: Read list of digits (0/2) 2  Attention: Read list of letters (0/1) 1  Attention: Serial 7 subtraction starting at 100 (0/3) 3  Language: Repeat phrase (0/2) 2  Language : Fluency (0/1) 1  Abstraction (0/2) 2  Delayed Recall (0/5) 5  Orientation (0/6) 6  Total 28  Adjusted Score (based on education) 28    CRANIAL NERVES: CN II: Visual fields are full to confrontation. Pupils are round equal and briskly reactive to light. CN III, IV, VI: extraocular movement are normal. No ptosis. CN V: Facial sensation is intact to light touch CN VII: Face is symmetric with normal eye closure  CN VIII: Hearing is normal to causal conversation. CN IX, X: Phonation is normal. CN XI: Head turning and shoulder shrug are intact  MOTOR: There is no pronator  drift of out-stretched arms. Muscle bulk and tone are normal. Muscle strength is normal.  REFLEXES: Reflexes are 2+ and symmetric at the biceps, triceps, knees, and ankles. Plantar responses are flexor.  SENSORY: Intact to light touch, pinprick and vibratory sensation are intact in fingers and toes.  COORDINATION: There is no trunk or limb dysmetria noted.  GAIT/STANCE: Push up to get up from seated position,  cautious,  REVIEW OF SYSTEMS:  Full 14 system review of systems performed and notable only for as above All other review of systems were negative.   ALLERGIES: Allergies  Allergen Reactions   Aspirin     Avoid due to bariatric surgery   Nsaids     Avoid due to bariatric surgery   Oxycodone Hcl     Hallucinations, agitation     HOME MEDICATIONS: Current Outpatient Medications  Medication Sig Dispense Refill   acetaminophen (TYLENOL) 650 MG CR tablet Take 1,300 mg by mouth every 8 (eight) hours as needed for pain.     atorvastatin (LIPITOR) 20 MG tablet Take 20 mg by mouth at bedtime.     cephALEXin (KEFLEX) 500 MG capsule Take 1 capsule (500 mg total) by mouth 4 (four) times daily. 20 capsule 0   desvenlafaxine (PRISTIQ) 25 MG 24 hr tablet Take 25 mg by mouth daily.     fludrocortisone (FLORINEF) 0.1 MG tablet Take 0.1 mg by mouth daily.     gabapentin (NEURONTIN) 100 MG capsule Take 4 capsules (400 mg total) by mouth at bedtime. 120 capsule 2   latanoprost (XALATAN) 0.005 % ophthalmic solution Place 1 drop into both eyes at bedtime.     LORazepam (ATIVAN) 1 MG tablet Take 0.5 mg by mouth every 4 (four) hours.     Multiple Vitamins-Minerals (ONE DAILY MULTIVITAMIN MEN) TABS Take 1 tablet by mouth 2 (two) times daily.     pantoprazole (PROTONIX) 40 MG tablet Take 80 mg by mouth at bedtime.      Polyethyl Glycol-Propyl Glycol (SYSTANE OP) Place 1 drop into both eyes daily as needed (dry eyes).     Probiotic Product (PROBIOTIC PO) Take 1 capsule by mouth daily.     SUPER B COMPLEX/C PO Take 1 tablet by mouth daily.     zolpidem (AMBIEN) 10 MG tablet Take 5-10 mg by mouth at bedtime.  0   No current facility-administered medications for this visit.    PAST MEDICAL HISTORY: Past Medical History:  Diagnosis Date   ADD (attention deficit disorder)    Anxiety 2007   Asthma as child   Asymptomatic gallstones    Atrial fibrillation (HCC)    Atrial flutter (HCC)    Depression     DJD (degenerative joint disease) of knee    Dysrhythmia    Chronic Atrial Fib- seeing Dr. She[facility]   H/O peptic ulcer    past history with GI bleed- cauterization done throught scope   History of kidney stones    pt claims urology discovered a kidney stone last week 05/25/21   Hypercholesterolemia    Orthostatic hypotension    Pneumonia as child   Pneumothorax on left 05/20/2009   Prediabetes    none now   Pulmonary nodules    Sleep apnea    cpap set on 13   Thrombocytopenia (HCC)    pt denies   Thyromegaly    Varicose veins     PAST SURGICAL HISTORY: Past Surgical History:  Procedure Laterality Date   BIOPSY  04/18/2018  Procedure: BIOPSY;  Surgeon: Kerin Salen, MD;  Location: [facility];  Service: Gastroenterology;;   ESOPHAGOGASTRODUODENOSCOPY N/A 04/18/2018   Procedure: ESOPHAGOGASTRODUODENOSCOPY (EGD);  Surgeon: Kerin Salen, MD;  Location: [facility];  Service: Gastroenterology;  Laterality: N/A;   ESOPHAGOGASTRODUODENOSCOPY (EGD) WITH PROPOFOL N/A 01/05/2015   Procedure: ESOPHAGOGASTRODUODENOSCOPY (EGD) WITH PROPOFOL;  Surgeon: Charolett Bumpers, MD;  Location: [facility];  Service: Endoscopy;  Laterality: N/A;   EXTRACORPOREAL SHOCK WAVE LITHOTRIPSY Left 08/11/2021   Procedure: LEFT EXTRACORPOREAL SHOCK WAVE LITHOTRIPSY (ESWL);  Surgeon: Jannifer Hick, MD;  Location: [facility];  Service: Urology;  Laterality: Left;   GASTRIC ROUX-EN-Y     '08-[name] (weight stable around 302 #   KNEE ARTHROSCOPY     NASAL SEPTOPLASTY W/ TURBINOPLASTY Bilateral 11/13/2022   Procedure: NASAL SEPTOPLASTY WITH BILATERAL TURBINATE REDUCTION;  Surgeon: Newman Pies, MD;  Location: [facility];  Service: ENT;  Laterality: Bilateral;   TONSILLECTOMY     TOTAL KNEE ARTHROPLASTY Left 06/03/2021   Procedure: LEFT TOTAL KNEE ARTHROPLASTY;  Surgeon: Nadara Mustard, MD;  Location: [facility];  Service: Orthopedics;  Laterality: Left;   ULNAR NERVE  TRANSPOSITION Right 15 yrs ago    FAMILY HISTORY: Family History  Problem Relation Age of Onset   Heart disease Mother    Heart failure Mother    Heart disease Father    Heart attack Father    Stroke Neg Hx     SOCIAL HISTORY: Social History   Socioeconomic History   Marital status: Single    Spouse name: Not on file   Number of children: Not on file   Years of education: Not on file   Highest education level: Not on file  Occupational History   Not on file  Tobacco Use   Smoking status: Never   Smokeless tobacco: Never  Vaping Use   Vaping status: Never Used  Substance and Sexual Activity   Alcohol use: No    Alcohol/week: 0.0 standard drinks of alcohol   Drug use: Never   Sexual activity: Yes  Other Topics Concern   Not on file  Social History Narrative   Not on file   Social Determinants of Health   Financial Resource Strain: Not on file  Food Insecurity: Not on file  Transportation Needs: Not on file  Physical Activity: Not on file  Stress: Not on file  Social Connections: Unknown (07/07/2022)   Received [facility]   Social Network    Social Network: Not on file  Intimate Partner Violence: Not At Risk (12/10/2022)   Received from [facility]   HITS    Over the last 12 months how often did your partner physically hurt you?: 1    Over the last 12 months how often did your partner insult you or talk down to you?: 1    Over the last 12 months how often did your partner threaten you with physical harm?: 1    Over the last 12 months how often did your partner scream or curse at you?: 1      [name], M.D. Ph.D.  [facility] [address], [address] [place], [place] [address] Ph: [phone] Fax: [phone]  CC:  Reubin Milan, MD [address] [place],  [place] [address]  Emilio Aspen, MD

## 2023-03-01 ENCOUNTER — Telehealth: Payer: Self-pay

## 2023-03-01 NOTE — Telephone Encounter (Signed)
Left msg to obtain result

## 2023-03-01 NOTE — Progress Notes (Signed)
Subjective: Chief Complaint  Patient presents with   RFC    RFC    72 y.o. returns the office today for painful, elongated, thickened toenails which he cannot trim himself.  He still experiences the neuropathy symptoms and on gabapentin which helps.   States he is looking for a new PCP.   Emilio Aspen, MD  Objective: AAO 3, NAD DP/PT pulses palpable, CRT less than 3 seconds Sensation decreased. Nails hypertrophic, dystrophic, elongated, brittle, discolored 10. There is tenderness overlying the nails 1-5 bilaterally. There is no surrounding erythema or drainage along the nail sites.  The right hallux nail appears to have more yellow discoloration.  The other toenails. No pain with calf compression, swelling, warmth, erythema.  Assessment: Patient presents with symptomatic onychomycosis, neuropathy- on Eliquis  Plan: -Treatment options including alternatives, risks, complications were discussed -Nails sharply debrided 10 without complication/bleeding. -Continue gabapentin. -Monitor for any signs or symptoms of worsening neuropathy. -Referral placed for new PCP -Discussed daily foot inspection. If there are any changes, to call the office immediately.  -Follow-up in 3 months or sooner if any problems are to arise. In the meantime, encouraged to call the office with any questions, concerns, changes symptoms.  Return in about 3 months (around 05/25/2023).  Ovid Curd, DPM

## 2023-03-01 NOTE — Telephone Encounter (Signed)
-----   Message from [name] sent at 03/01/2023 11:07 AM EDT ----- Please call patient, laboratory evaluation showed no significant abnormalities.

## 2023-03-04 IMAGING — CT CT PELVIS W/ CM
2 of 3 series · 15 of 46 positions shown, 17 images · IV contrast (omnipaque)
Comparison: 05/25/2021 unenhanced CT abdomen/pelvis.

CLINICAL DATA: Inpatient. Indeterminate left seminal vesicle region
soft tissue mass on recent unenhanced CT.

EXAM:
CT PELVIS WITH CONTRAST
TECHNIQUE: Multidetector CT imaging of the pelvis was performed using the
standard protocol following the bolus administration of intravenous
contrast.
CONTRAST:  100mL OMNIPAQUE IOHEXOL 300 MG/ML  SOLN

[Series 3: pelvis with 5.0 · axial · 0.97mm/px · z∈[-1231,-941]mm · 12 of 68 slices shown, 14 images]
[im 5/68  soft-tissue]
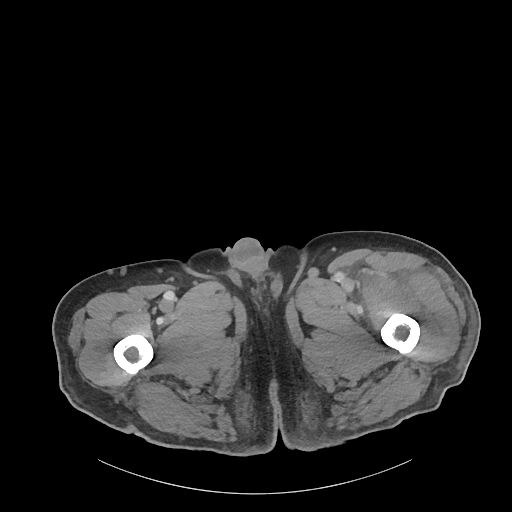
[im 5/68  bone]
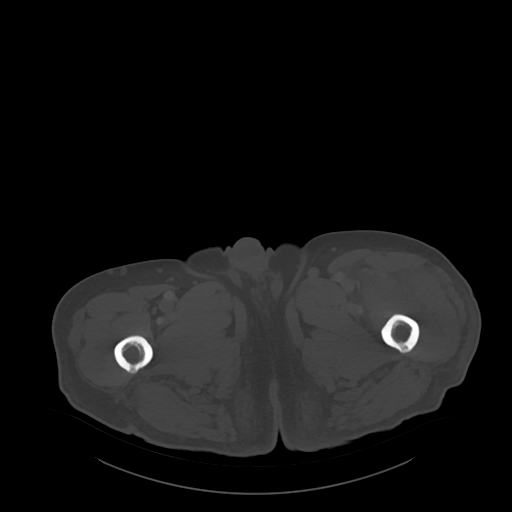
[im 9/68  soft-tissue]
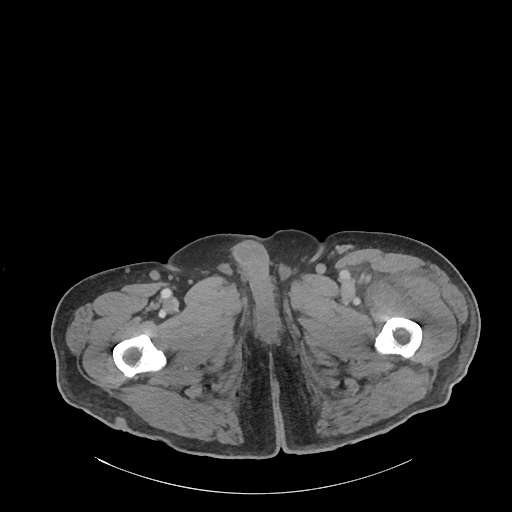
[im 16/68  soft-tissue]
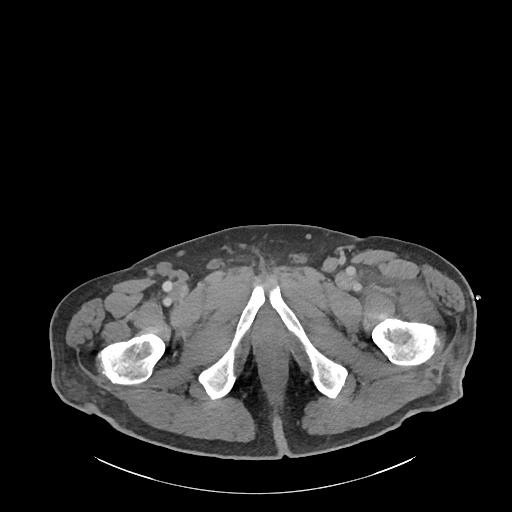
[im 20/68  soft-tissue]
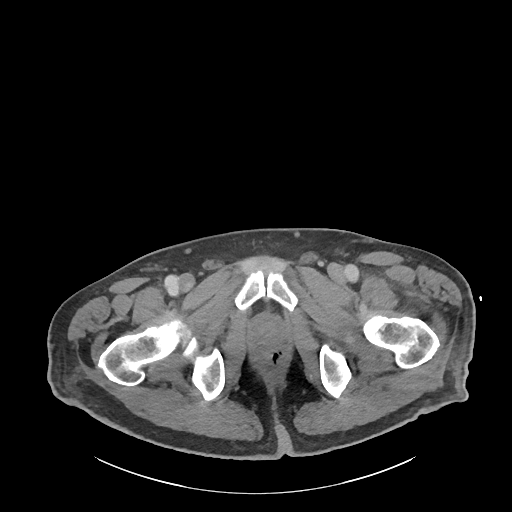
[im 26/68  soft-tissue]
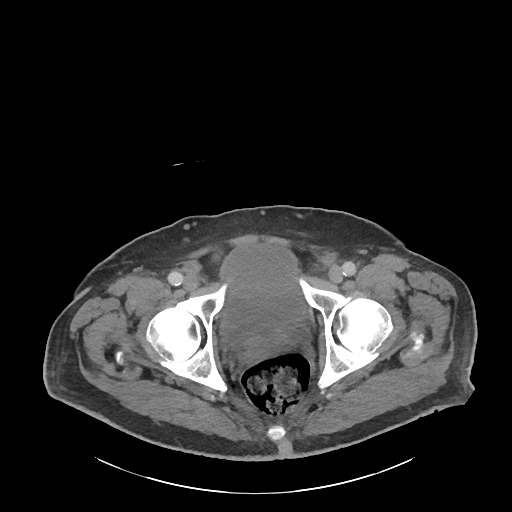
[im 31/68  soft-tissue]
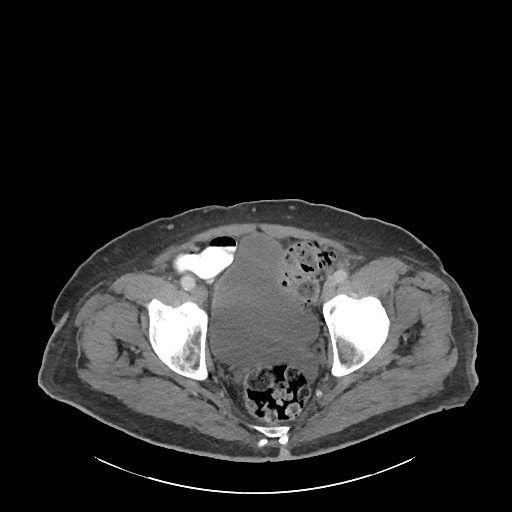
[im 37/68  soft-tissue]
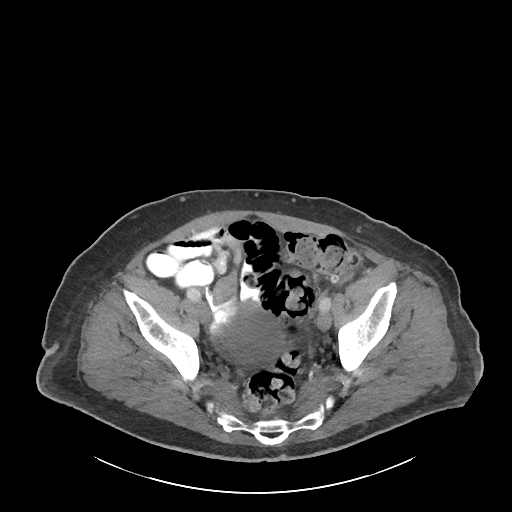
[im 42/68  soft-tissue]
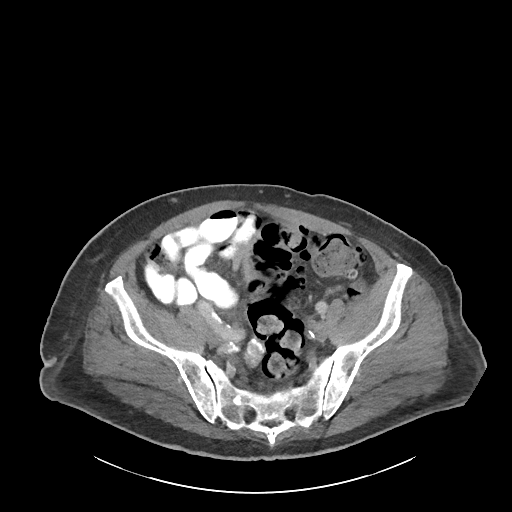
[im 48/68  soft-tissue]
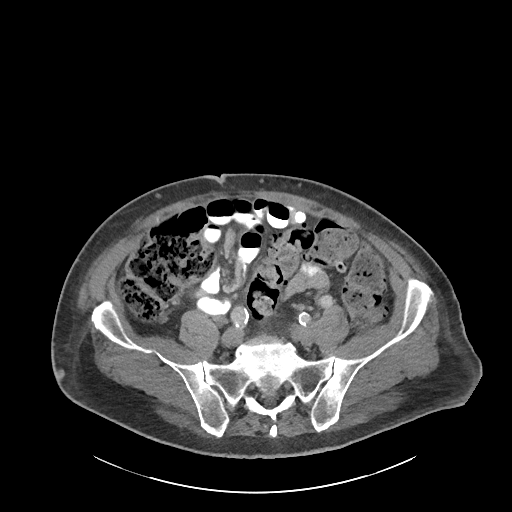
[im 48/68  bone]
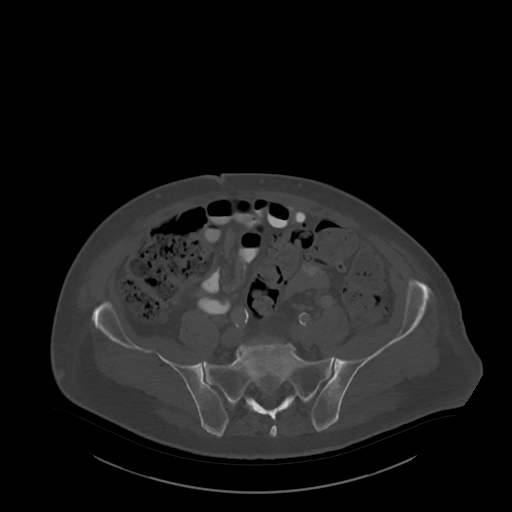
[im 52/68  soft-tissue]
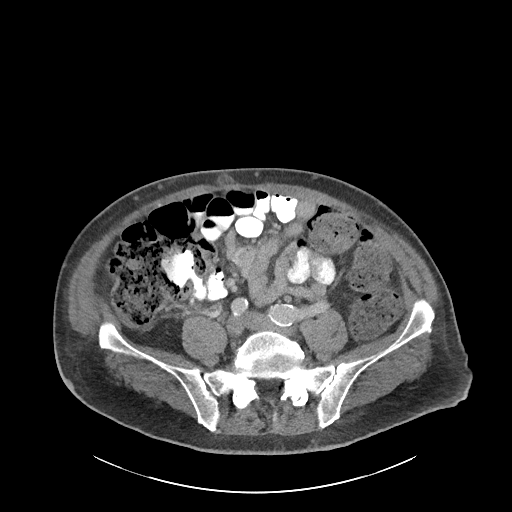
[im 59/68  soft-tissue]
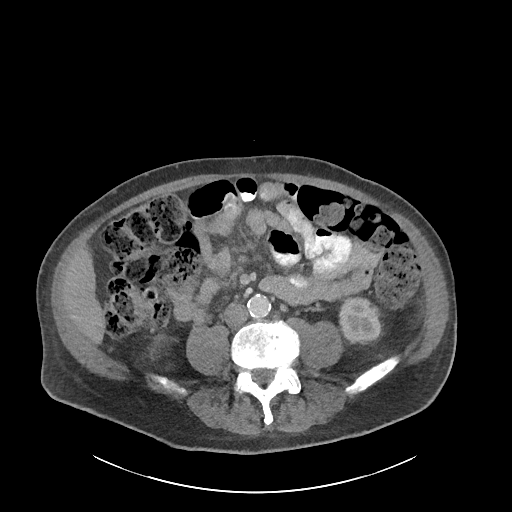
[im 63/68  soft-tissue]
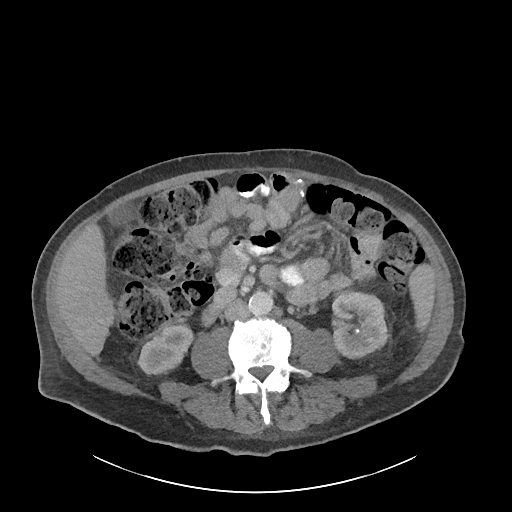

[Series 5: pelvis with 2.0 cor · coronal · 0.66mm/px · 3 of 153 slices shown]
[im 51/153  soft-tissue]
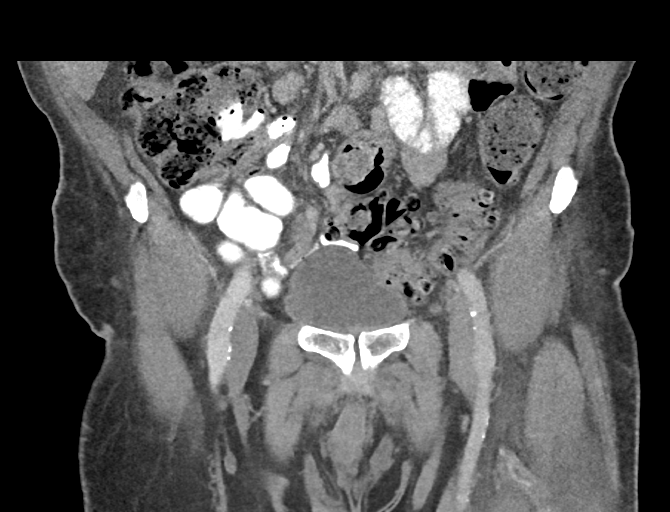
[im 68/153  soft-tissue]
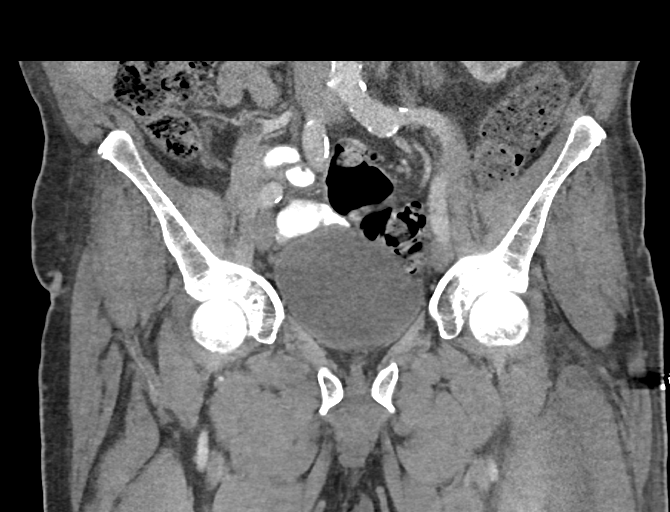
[im 85/153  soft-tissue]
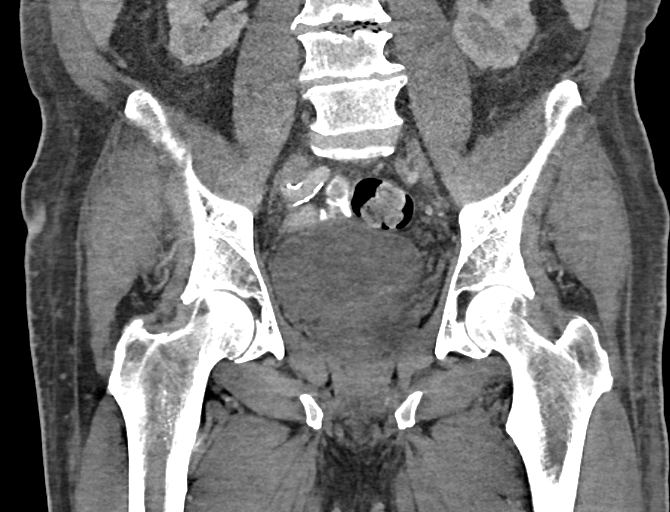

[15 of 46 positions shown; findings below may reference images not displayed]

FINDINGS: Urinary Tract: Normal bladder. Normal caliber ureters. Exophytic
homogeneous 4.7 cm hypodense anterior interpolar left renal cortical
lesion (series 3/image 1), not substantially changed since
01/26/2011 CT abdomen study, where it was characterized as a benign
complex cyst. Nonobstructing stones in the visualized lower kidneys
bilaterally, largest 6 mm in the lower left kidney. Previously
visualized mild-to-moderate left hydronephrosis has improved and is
now minimal. No UPJ or ureteral stones on today's scan.

Bowel: Moderate sigmoid diverticulosis. Moderate diffuse colorectal
stool. No dilated pelvic small bowel loops. No bowel wall
thickening.

Vascular/Lymphatic: Atherosclerotic nonaneurysmal visualized
abdominal aorta. Mildly dilated 2.2 cm diameter left common iliac
artery. No pathologically enlarged pelvic lymph nodes.

Reproductive: Top-normal size prostate. Mildly asymmetrically
enlarged homogeneous left seminal vesicle, which measures 3.5 x
cm (series 3/image 38), significantly decreased from 5.5 x 3.1 cm on
05/25/2021 unenhanced CT abdomen/pelvis study using similar
measurement technique. The right seminal vesicle is asymmetrically
atrophic, unchanged.

Other: No pelvic ascites or pneumoperitoneum. No focal fluid
collections.

Musculoskeletal: No aggressive appearing focal osseous lesions.
Marked degenerative disc disease in the visualized lower lumbar
spine, most prominent at L3-4.
IMPRESSION: 1. Mildly asymmetrically enlarged homogeneous left seminal vesicle,
significantly decreased in size since 05/25/2021 unenhanced CT
abdomen/pelvis study. Findings are currently within normal limits.
No discrete mass or fluid collection.
2. No pelvic lymphadenopathy.
3. Nonobstructing bilateral nephrolithiasis. Previously visualized
left hydroureteronephrosis has improved since 05/25/2021 CT, with
residual minimal left hydronephrosis in the visualized left kidney.
No residual UPJ or ureteral stones on today's pelvic CT study.
4. Moderate sigmoid diverticulosis. Moderate colorectal stool
volume, suggesting constipation.
5. Aortic Atherosclerosis ([id number].J).

## 2023-03-05 NOTE — Telephone Encounter (Signed)
At 11:51 pt left a vm returning a call to [facility], CMA, please call pt.

## 2023-03-05 NOTE — Telephone Encounter (Signed)
Result given to pt and he voiced gratitude and understanding

## 2023-03-05 NOTE — Telephone Encounter (Signed)
 Left msg to obtain result

## 2023-03-08 DIAGNOSIS — F332 Major depressive disorder, recurrent severe without psychotic features: Secondary | ICD-10-CM | POA: Diagnosis not present

## 2023-03-08 DIAGNOSIS — F411 Generalized anxiety disorder: Secondary | ICD-10-CM | POA: Diagnosis not present

## 2023-03-08 DIAGNOSIS — F429 Obsessive-compulsive disorder, unspecified: Secondary | ICD-10-CM | POA: Diagnosis not present

## 2023-03-13 DIAGNOSIS — H16223 Keratoconjunctivitis sicca, not specified as Sjogren's, bilateral: Secondary | ICD-10-CM | POA: Diagnosis not present

## 2023-03-15 DIAGNOSIS — F411 Generalized anxiety disorder: Secondary | ICD-10-CM | POA: Diagnosis not present

## 2023-03-15 DIAGNOSIS — F332 Major depressive disorder, recurrent severe without psychotic features: Secondary | ICD-10-CM | POA: Diagnosis not present

## 2023-03-17 DIAGNOSIS — Z23 Encounter for immunization: Secondary | ICD-10-CM | POA: Diagnosis not present

## 2023-03-29 DIAGNOSIS — F332 Major depressive disorder, recurrent severe without psychotic features: Secondary | ICD-10-CM | POA: Diagnosis not present

## 2023-03-29 DIAGNOSIS — F411 Generalized anxiety disorder: Secondary | ICD-10-CM | POA: Diagnosis not present

## 2023-04-03 ENCOUNTER — Ambulatory Visit
Admission: RE | Admit: 2023-04-03 | Discharge: 2023-04-03 | Disposition: A | Discharge status: Home / Self Care | Payer: Medicare Other | Source: Ambulatory Visit | Attending: Neurology | Admitting: Neurology

## 2023-04-03 DIAGNOSIS — R413 Other amnesia: Secondary | ICD-10-CM

## 2023-04-03 DIAGNOSIS — F32A Depression, unspecified: Secondary | ICD-10-CM

## 2023-04-03 DIAGNOSIS — G4733 Obstructive sleep apnea (adult) (pediatric): Secondary | ICD-10-CM | POA: Diagnosis not present

## 2023-04-04 ENCOUNTER — Telehealth: Payer: Self-pay | Admitting: Neurology

## 2023-04-04 NOTE — Telephone Encounter (Signed)
Phone room: Please advise pt that dr. Terrace Arabia needs to review them and we will contact them once results reviewed and recommendations have been made. Thanks,  Production assistant, radio

## 2023-04-04 NOTE — Telephone Encounter (Signed)
Pt called wanting to know if his MRI results have come in. Pt states GI informed him that the provider should have received them last night. Pt was informed that he will receive a call once the provider has gone over the results.

## 2023-04-09 ENCOUNTER — Telehealth: Payer: Self-pay | Admitting: Neurology

## 2023-04-09 NOTE — Telephone Encounter (Signed)
Call to patient, reviewed results and patient stated he failed to mention Dr. Terrace Arabia about severe concussion 20 years ago and fall 6 months ago where he hit right orbital bone in friends head, which broke his fall. He is wondering if results have correlations with those events and what are next steps. He denies report falls and slip frequently and has worked with PT.

## 2023-04-09 NOTE — Telephone Encounter (Signed)
Please call patient MRI of the brain showed evidence of brain atrophy, most noticeable at the left side of the brain, mild small vessel disease,  There was no acute abnormalities MRI brain (without) demonstrating: -Mild right and moderate to severe perisylvian atrophy. -Mild chronic small vessel ischemic disease. -Chronic cerebral microhemorrhage in left frontal region.  Adjacent left frontal sulcal SWI hyperintensity may be related to underlying amyloid angiopathy or other causes of hemosiderin deposition. -No acute findings.

## 2023-04-10 NOTE — Telephone Encounter (Addendum)
MRI brain findings are less likely related to previous injury, give him a follow up with NP in 6 months  If he has a lots of questions about MRI, may setup a virtual visit to review MRI with me. Ok to double book on any Wed injection slot

## 2023-04-11 NOTE — Telephone Encounter (Signed)
Return call to patient, he feels all questions were answers and declines my chart visit. He is in agreement to 6 month visit with Margie Ege NP per Dr. Terrace Arabia. He is concerned about hemoglobin  due to previous low levels and iron infusions, he is in between PCP's and he initially asked if Dr. Terrace Arabia would order labs to check blood, but then retracted stating he will have PCP once established. Patient aware someone will call to schedule 6 month NP visit.

## 2023-04-11 NOTE — Telephone Encounter (Signed)
Pt returned call. Please call back when available.

## 2023-04-11 NOTE — Telephone Encounter (Signed)
1st attempt lvm to rc

## 2023-04-12 DIAGNOSIS — F332 Major depressive disorder, recurrent severe without psychotic features: Secondary | ICD-10-CM | POA: Diagnosis not present

## 2023-04-12 DIAGNOSIS — F411 Generalized anxiety disorder: Secondary | ICD-10-CM | POA: Diagnosis not present

## 2023-04-18 NOTE — Telephone Encounter (Signed)
Pt returned call and was informed of his scheduled appt.

## 2023-04-24 DIAGNOSIS — Z23 Encounter for immunization: Secondary | ICD-10-CM | POA: Diagnosis not present

## 2023-04-26 DIAGNOSIS — F411 Generalized anxiety disorder: Secondary | ICD-10-CM | POA: Diagnosis not present

## 2023-04-26 DIAGNOSIS — F332 Major depressive disorder, recurrent severe without psychotic features: Secondary | ICD-10-CM | POA: Diagnosis not present

## 2023-04-26 DIAGNOSIS — N5201 Erectile dysfunction due to arterial insufficiency: Secondary | ICD-10-CM | POA: Diagnosis not present

## 2023-04-26 DIAGNOSIS — F5232 Male orgasmic disorder: Secondary | ICD-10-CM | POA: Diagnosis not present

## 2023-05-07 ENCOUNTER — Other Ambulatory Visit: Payer: Self-pay | Admitting: Podiatry

## 2023-05-07 ENCOUNTER — Telehealth: Payer: Self-pay | Admitting: Podiatry

## 2023-05-07 MED ORDER — GABAPENTIN 100 MG PO CAPS: 400.0000 mg | ORAL_CAPSULE | Freq: Every day | ORAL | 2 refills | Status: DC

## 2023-05-07 NOTE — Telephone Encounter (Signed)
Pt is calling to get a refill on his prescription

## 2023-05-09 ENCOUNTER — Other Ambulatory Visit: Payer: Self-pay | Admitting: Podiatry

## 2023-05-09 DIAGNOSIS — Z23 Encounter for immunization: Secondary | ICD-10-CM | POA: Diagnosis not present

## 2023-05-09 IMAGING — DX DG ABDOMEN 1V
2 series · 2 of 2 positions shown · non-contrast
Comparison: 07/27/2021

CLINICAL DATA: Left renal stone

EXAM:
ABDOMEN - 1 VIEW

[abdomen kub (1 of 2)]
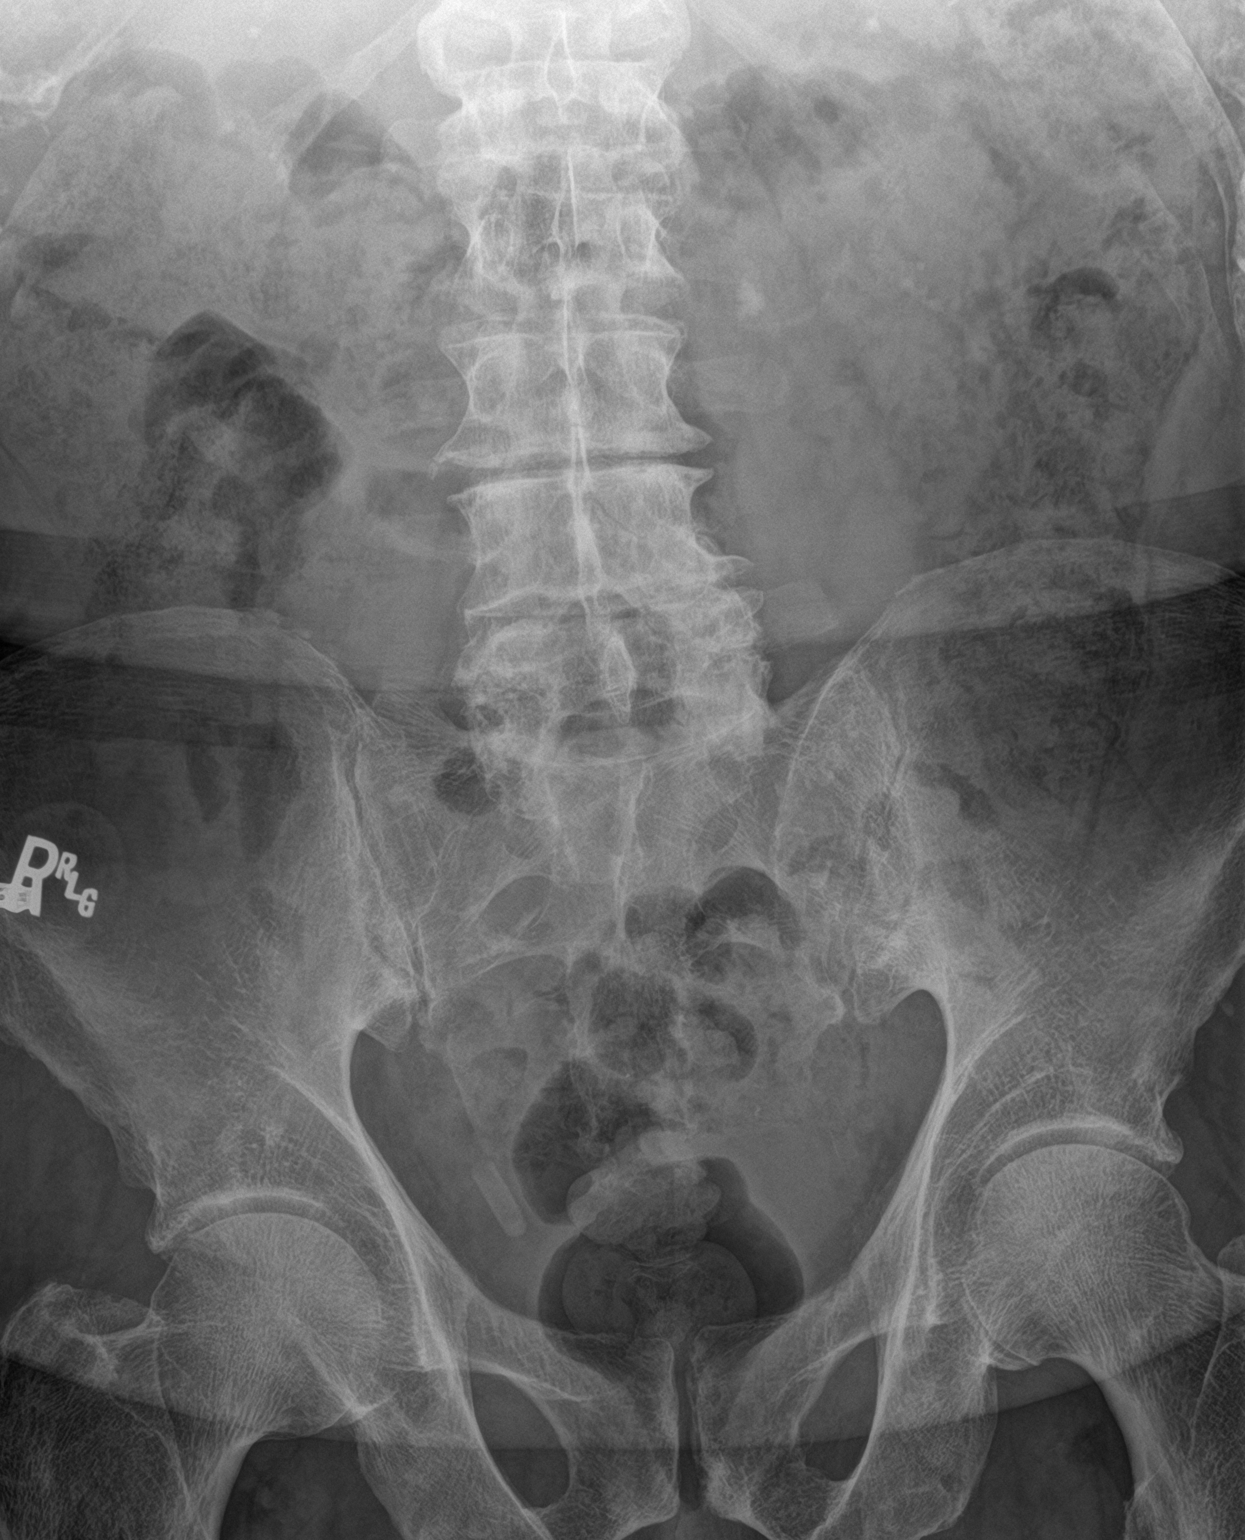

[abdomen kub (2 of 2)]
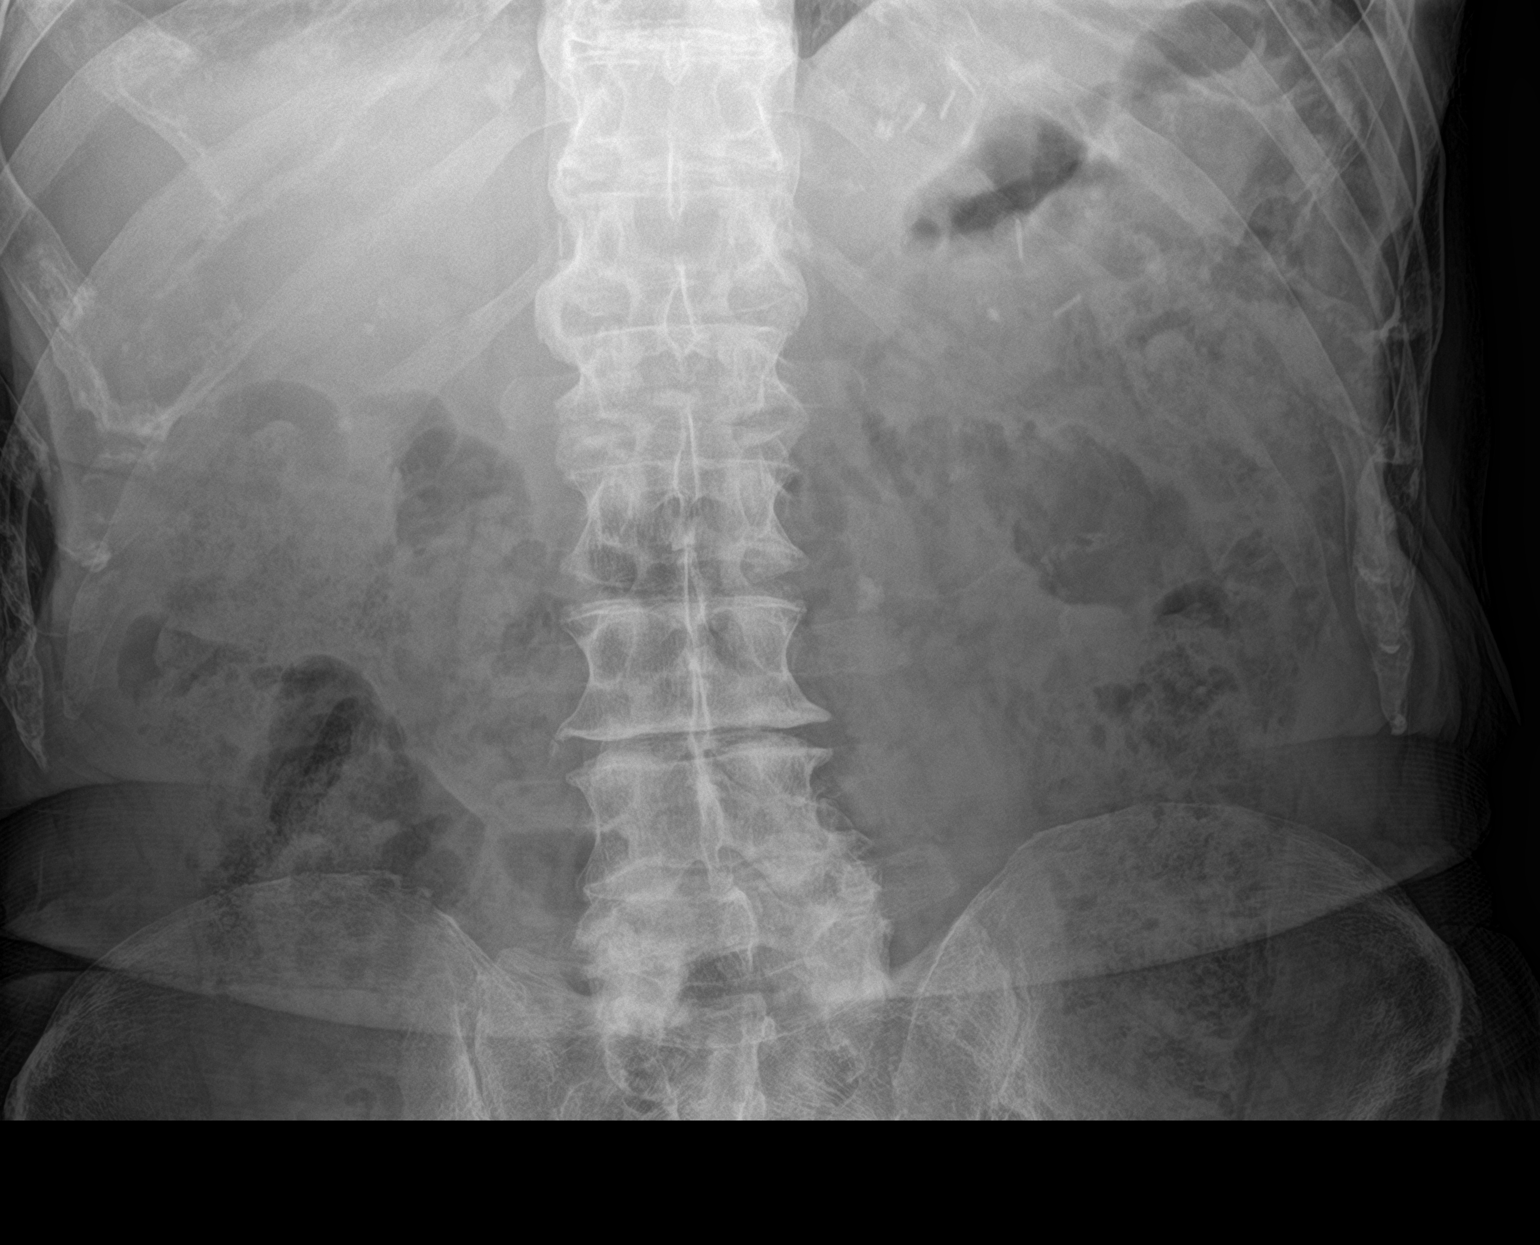

[2 of 2 positions shown; findings below may reference images not displayed]

FINDINGS: Bowel gas pattern is nonspecific. Moderate to large amount of stool
is seen in the colon. There is 10 mm calcification medial to the
midportion of left kidney. In the previous study, similar
calcification was noted overlying the lower pole of left kidney.
There are other possible small bilateral renal stones each measuring
3 mm in size. Kidneys are partly obscured by bowel contents. There
is 2 cm cylindrical structure in the right side of pelvis which was
not evident in the previous examination. This may be an artifact
outside the patient's body or foreign body in the soft tissues.
Degenerative changes are noted in the lumbar spine.
IMPRESSION: There is 10 mm calcific density medial to the midportion of left
kidney suggesting left renal stone in the renal pelvis or
ureteropelvic junction. There are other possible small bilateral
renal stones.

## 2023-05-10 DIAGNOSIS — F411 Generalized anxiety disorder: Secondary | ICD-10-CM | POA: Diagnosis not present

## 2023-05-10 DIAGNOSIS — F332 Major depressive disorder, recurrent severe without psychotic features: Secondary | ICD-10-CM | POA: Diagnosis not present

## 2023-05-15 DIAGNOSIS — H40023 Open angle with borderline findings, high risk, bilateral: Secondary | ICD-10-CM | POA: Diagnosis not present

## 2023-05-15 DIAGNOSIS — H18513 Endothelial corneal dystrophy, bilateral: Secondary | ICD-10-CM | POA: Diagnosis not present

## 2023-05-15 DIAGNOSIS — H401132 Primary open-angle glaucoma, bilateral, moderate stage: Secondary | ICD-10-CM | POA: Diagnosis not present

## 2023-05-15 DIAGNOSIS — H16223 Keratoconjunctivitis sicca, not specified as Sjogren's, bilateral: Secondary | ICD-10-CM | POA: Diagnosis not present

## 2023-05-15 DIAGNOSIS — H5213 Myopia, bilateral: Secondary | ICD-10-CM | POA: Diagnosis not present

## 2023-05-15 DIAGNOSIS — H2513 Age-related nuclear cataract, bilateral: Secondary | ICD-10-CM | POA: Diagnosis not present

## 2023-05-23 DIAGNOSIS — D225 Melanocytic nevi of trunk: Secondary | ICD-10-CM | POA: Diagnosis not present

## 2023-05-23 DIAGNOSIS — L570 Actinic keratosis: Secondary | ICD-10-CM | POA: Diagnosis not present

## 2023-05-23 DIAGNOSIS — L821 Other seborrheic keratosis: Secondary | ICD-10-CM | POA: Diagnosis not present

## 2023-05-23 DIAGNOSIS — D2262 Melanocytic nevi of left upper limb, including shoulder: Secondary | ICD-10-CM | POA: Diagnosis not present

## 2023-05-23 DIAGNOSIS — L814 Other melanin hyperpigmentation: Secondary | ICD-10-CM | POA: Diagnosis not present

## 2023-05-23 DIAGNOSIS — D2261 Melanocytic nevi of right upper limb, including shoulder: Secondary | ICD-10-CM | POA: Diagnosis not present

## 2023-05-23 DIAGNOSIS — Z129 Encounter for screening for malignant neoplasm, site unspecified: Secondary | ICD-10-CM | POA: Diagnosis not present

## 2023-05-23 DIAGNOSIS — L308 Other specified dermatitis: Secondary | ICD-10-CM | POA: Diagnosis not present

## 2023-05-24 DIAGNOSIS — F332 Major depressive disorder, recurrent severe without psychotic features: Secondary | ICD-10-CM | POA: Diagnosis not present

## 2023-05-24 DIAGNOSIS — F411 Generalized anxiety disorder: Secondary | ICD-10-CM | POA: Diagnosis not present

## 2023-05-28 ENCOUNTER — Ambulatory Visit (INDEPENDENT_AMBULATORY_CARE_PROVIDER_SITE_OTHER): Payer: Medicare Other | Admitting: Podiatry

## 2023-05-28 DIAGNOSIS — Z7901 Long term (current) use of anticoagulants: Secondary | ICD-10-CM

## 2023-05-28 DIAGNOSIS — M79675 Pain in left toe(s): Secondary | ICD-10-CM

## 2023-05-28 DIAGNOSIS — M79674 Pain in right toe(s): Secondary | ICD-10-CM | POA: Diagnosis not present

## 2023-05-28 DIAGNOSIS — B351 Tinea unguium: Secondary | ICD-10-CM

## 2023-05-29 ENCOUNTER — Encounter: Payer: Self-pay | Admitting: Sports Medicine

## 2023-05-29 ENCOUNTER — Ambulatory Visit (INDEPENDENT_AMBULATORY_CARE_PROVIDER_SITE_OTHER): Payer: Medicare Other | Admitting: Sports Medicine

## 2023-05-29 VITALS — BP 110/80 | HR 89 | Temp 97.0°F | Resp 17 | Ht 75.0 in | Wt 222.2 lb

## 2023-05-29 DIAGNOSIS — Z113 Encounter for screening for infections with a predominantly sexual mode of transmission: Secondary | ICD-10-CM | POA: Diagnosis not present

## 2023-05-29 DIAGNOSIS — K2100 Gastro-esophageal reflux disease with esophagitis, without bleeding: Secondary | ICD-10-CM | POA: Diagnosis not present

## 2023-05-29 DIAGNOSIS — E785 Hyperlipidemia, unspecified: Secondary | ICD-10-CM | POA: Diagnosis not present

## 2023-05-29 DIAGNOSIS — J342 Deviated nasal septum: Secondary | ICD-10-CM | POA: Diagnosis not present

## 2023-05-29 DIAGNOSIS — I951 Orthostatic hypotension: Secondary | ICD-10-CM

## 2023-05-29 DIAGNOSIS — F988 Other specified behavioral and emotional disorders with onset usually occurring in childhood and adolescence: Secondary | ICD-10-CM

## 2023-05-29 DIAGNOSIS — G4733 Obstructive sleep apnea (adult) (pediatric): Secondary | ICD-10-CM

## 2023-05-29 DIAGNOSIS — D649 Anemia, unspecified: Secondary | ICD-10-CM | POA: Diagnosis not present

## 2023-05-29 DIAGNOSIS — I4811 Longstanding persistent atrial fibrillation: Secondary | ICD-10-CM

## 2023-05-29 DIAGNOSIS — F32A Depression, unspecified: Secondary | ICD-10-CM | POA: Diagnosis not present

## 2023-05-29 MED ORDER — FLUTICASONE PROPIONATE 50 MCG/ACT NA SUSP: 2.0000 | Freq: Every day | NASAL | 6 refills | Status: DC

## 2023-05-29 MED ORDER — FEXOFENADINE HCL 180 MG PO TABS
180.0000 mg | ORAL_TABLET | Freq: Every day | ORAL | 3 refills | Status: DC
Start: 2023-05-29 — End: 2023-06-14

## 2023-05-29 NOTE — Progress Notes (Signed)
Careteam: Patient Care Team: Francisco Feinstein, MD as PCP - General (Neurology) [name], MD as PCP - Cardiology (Cardiology)  PLACE OF SERVICE:  [facility]  Advanced Directive information    Allergies  Allergen Reactions   Aspirin     Avoid due to bariatric surgery   Nsaids     Avoid due to bariatric surgery   Oxycodone Hcl     Hallucinations, agitation     Chief Complaint  Patient presents with   Establish Care    New patient.      HPI: Patient is a 72 y.o. 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  On effexor, desvenlafaxine, zolpidem, ativan, amphetamine Informed patient to discuss with his psychiatrist regarding questions about above meds  Neuropathy  Had left  knee surgery  C/o tingling and numbness in his feet  On gabapentin 100mg  daily   HLD  On atorvastatin  20 mg   Hypotension  States that he has been taking fludrocortisone 0.1mg  daily   Afib  Follows with cardiology  On eliquis 5 mg  twice a day   GERD  S/p bariatric surgery  On protonix Denies bloody or dark stools  BPH  On alfuzosin   Pt reports that  he had bleeding episode and went to ED  HAD egd 2019  Colonoscopy  last year    Review of Systems:  Review of Systems  Constitutional:  Negative for chills and fever.  HENT:  Negative for congestion and sore throat.   Eyes:  Negative for double vision.  Respiratory:  Negative for cough, sputum production and shortness of breath.   Cardiovascular:  Negative for chest pain, palpitations and leg swelling.  Gastrointestinal:  Negative for abdominal pain, heartburn and nausea.  Genitourinary:  Negative for dysuria,  frequency and hematuria.  Musculoskeletal:  Negative for falls and myalgias.  Neurological:  Negative for dizziness, sensory change and focal weakness.  Psychiatric/Behavioral:  Positive for depression and memory loss. The patient is nervous/anxious.     Past Medical History:  Diagnosis Date   ADD (attention deficit disorder)    Anxiety 2007   Asthma as child   Asymptomatic gallstones    Atrial fibrillation (HCC)    Atrial flutter (HCC)    Depression    DJD (degenerative joint disease) of knee    Dysrhythmia    Chronic Atrial Fib- seeing Dr. Shela Commons [facility]   H/O peptic ulcer    past history with GI bleed- cauterization done throught scope   History of kidney stones    pt claims urology discovered a kidney stone last week 05/25/21   Hypercholesterolemia    Orthostatic hypotension    Pneumonia as child   Pneumothorax on left 05/20/2009   Prediabetes    none now   Pulmonary nodules    Sleep apnea    cpap set on 13   Thrombocytopenia (HCC)    pt denies   Thyromegaly    Varicose veins    Past Surgical History:  Procedure Laterality Date   BIOPSY  04/18/2018   Procedure: BIOPSY;  Surgeon: Kerin Salen, MD;  Location: [facility];  Service: Gastroenterology;;   ESOPHAGOGASTRODUODENOSCOPY N/A 04/18/2018   Procedure: ESOPHAGOGASTRODUODENOSCOPY (EGD);  Surgeon: Kerin Salen, MD;  Location: [facility];  Service: Gastroenterology;  Laterality: N/A;   ESOPHAGOGASTRODUODENOSCOPY (EGD) WITH PROPOFOL N/A 01/05/2015   Procedure: ESOPHAGOGASTRODUODENOSCOPY (EGD) WITH PROPOFOL;  Surgeon: Charolett Bumpers, MD;  Location: [facility];  Service: Endoscopy;  Laterality: N/A;   EXTRACORPOREAL SHOCK WAVE LITHOTRIPSY Left 08/11/2021   Procedure: LEFT EXTRACORPOREAL SHOCK WAVE LITHOTRIPSY (ESWL);  Surgeon: Jannifer Hick, MD;  Location: [facility];  Service: Urology;  Laterality: Left;   GASTRIC ROUX-EN-Y     '08-[name] (weight stable around 302 #   KNEE ARTHROSCOPY      NASAL SEPTOPLASTY W/ TURBINOPLASTY Bilateral 11/13/2022   Procedure: NASAL SEPTOPLASTY WITH BILATERAL TURBINATE REDUCTION;  Surgeon: Newman Pies, MD;  Location: [facility];  Service: ENT;  Laterality: Bilateral;   TONSILLECTOMY     TOTAL KNEE ARTHROPLASTY Left 06/03/2021   Procedure: LEFT TOTAL KNEE ARTHROPLASTY;  Surgeon: Nadara Mustard, MD;  Location: [facility];  Service: Orthopedics;  Laterality: Left;   ULNAR NERVE TRANSPOSITION Right 15 yrs ago   Social History:   reports that he has never smoked. He has never used smokeless tobacco. He reports that he does not drink alcohol and does not use drugs.  Family History  Problem Relation Age of Onset   Heart disease Mother    Heart failure Mother    Heart disease Father    Heart attack Father    Stroke Neg Hx     Medications: Patient's Medications  New Prescriptions   No medications on file  Previous Medications   ACETAMINOPHEN (TYLENOL) 650 MG CR TABLET    Take 1,300 mg by mouth every 8 (eight) hours.   ATORVASTATIN (LIPITOR) 20 MG TABLET    Take 20 mg by mouth at bedtime.   DESVENLAFAXINE (PRISTIQ) 25 MG 24 HR TABLET    Take 12.5 mg by mouth every other day.   FLUDROCORTISONE (FLORINEF) 0.1 MG TABLET    Take 0.1 mg by mouth daily.   GABAPENTIN (NEURONTIN) 100 MG CAPSULE    TAKE FOUR CAPSULES BY MOUTH AT BEDTIME   LATANOPROST (XALATAN) 0.005 % OPHTHALMIC SOLUTION    Place 1 drop into both eyes at bedtime.   LORAZEPAM (ATIVAN) 1 MG TABLET    Take 0.5 mg by mouth every 4 (four) hours.   MULTIPLE VITAMINS-MINERALS (ONE DAILY MULTIVITAMIN MEN) TABS    Take 1 tablet by mouth 2 (two) times daily.   PANTOPRAZOLE (PROTONIX) 40 MG TABLET    Take 80 mg by mouth at bedtime.    POLYETHYL GLYCOL-PROPYL GLYCOL (SYSTANE OP)    Place 1 drop into both eyes daily as needed (dry eyes).   PROBIOTIC PRODUCT (PROBIOTIC PO)    Take 1 capsule by mouth daily.   SUPER B COMPLEX/C PO    Take 1 tablet by mouth daily.   ZOLPIDEM (AMBIEN) 10 MG TABLET     Take 5-10 mg by mouth at bedtime.  Modified Medications   No medications on file  Discontinued Medications   CEPHALEXIN (KEFLEX) 500 MG CAPSULE    Take 1 capsule (500 mg total) by mouth 4 (four) times daily.    Physical Exam:  Vitals:   05/29/23 0911  BP: 110/80  Pulse: 89  Resp: 17  Temp: (!) 97 F (36.1 C)  SpO2: 97%  Weight: 222 lb 3.2 oz (100.8 kg)  Height: 6\' 3"  (1.905 m)  Body mass index is 27.77 kg/m. Wt Readings from Last 3 Encounters:  05/29/23 222 lb 3.2 oz (100.8 kg)  02/28/23 215 lb (97.5 kg)  12/07/22 224 lb 13.9 oz (102 kg)    Physical Exam Constitutional:      Appearance: Normal appearance.  HENT:     Head: Normocephalic and atraumatic.  Cardiovascular:     Rate and Rhythm: Normal rate and regular rhythm.     Pulses: Normal pulses.     Heart sounds: Normal heart sounds.  Pulmonary:     Effort: No respiratory distress.     Breath sounds: No stridor. No wheezing or rales.  Abdominal:     General: Bowel sounds are normal. There is no distension.     Palpations: Abdomen is soft.     Tenderness: There is no abdominal tenderness. There is no right CVA tenderness or guarding.  Musculoskeletal:        General: No swelling.  Neurological:     Mental Status: He is alert. Mental status is at baseline.     Sensory: No sensory deficit.     Motor: No weakness.      Labs reviewed: Basic Metabolic Panel: Recent Labs    11/22/22 0951 12/07/22 1548 02/28/23 1537  NA 139 138  --   K 3.9 3.7  --   CL 108 105  --   CO2 25 26  --   GLUCOSE 120* 108*  --   BUN 30* 23  --   CREATININE 0.74 0.78  --   CALCIUM 8.8* 8.9  --   TSH  --   --  1.080   Liver Function Tests: Recent Labs    12/07/22 1548  AST 17  ALT 15  ALKPHOS 54  BILITOT 0.5  PROT 6.0*  ALBUMIN 3.5   No results for input(s): "LIPASE", "AMYLASE" in the last 8760 hours. No results for input(s): "AMMONIA" in the last 8760 hours. CBC: Recent Labs    11/22/22 1016 12/07/22 1548  02/28/23 1537  WBC 10.7* 6.4 7.1  NEUTROABS 7.4 4.4 5.2  HGB 11.3* 8.0* 13.1  HCT 34.1* 25.8* 40.8  MCV 92.7 91.2 86  PLT 205 295 216   Lipid Panel: No results for input(s): "CHOL", "HDL", "LDLCALC", "TRIG", "CHOLHDL", "LDLDIRECT" in the last 8760 hours. TSH: Recent Labs    02/28/23 1537  TSH 1.080   A1C: No results found for: "HGBA1C"   Assessment/Plan  1. Orthostatic hypotension Need records from previous provider Pt on florinef  2. Longstanding persistent atrial fibrillation (HCC) Rate controlled No signs of bleeding  Cont with eliquis  3. Obstructive sleep apnea Cont with cpap  4. Gastroesophageal reflux disease with esophagitis, unspecified whether hemorrhage Avoid spicy foods Denies dark or bloody stools Cont with pantop  5. Depression, unspecified depression type Denies having active suicidal ideations He reports that he lost his partner lat year  Follow up with psychiatry  6. Attention deficit disorder, unspecified type Follow up with psychiatry  7. Hyperlipidemia, unspecified hyperlipidemia type - Lipid Panel  8. Deviated nasal septum [Pt c/o nasal congestion  Instructed him to take flonase, allegra He is requesting a referral to ENT  - Ambulatory referral to ENT - fexofenadine (ALLEGRA ALLERGY) 180 MG tablet; Take 1 tablet (180 mg total) by mouth daily.  Dispense: 90 tablet; Refill: 3  9. Anemia, unspecified type No signs of bleeding - CBC With Differential/Platelet  10. Screening for STD (sexually transmitted disease)  - Hepatitis C antibody  Other orders -  alfuzosin (UROXATRAL) 10 MG 24 hr tablet; Take 10 mg by mouth daily with breakfast. - sildenafil (REVATIO) 20 MG tablet; Take 20 mg by mouth daily as needed. - Misc Natural Products (OSTEO BI-FLEX ADV TRIPLE ST PO); Take 1 tablet by mouth daily. - Melatonin 10 MG TABS; Take 1 tablet by mouth at bedtime. - apixaban (ELIQUIS) 5 MG TABS tablet; Take 5 mg by mouth 2 (two) times daily. -  Coenzyme Q10 (CO Q 10 PO); Take 1 capsule by mouth daily. - Glucosamine-Chondroitin (COSAMIN DS PO); Take 1,500 mg by mouth daily. Rotate with other [facility] - fluticasone (FLONASE) 50 MCG/ACT nasal spray; Place 2 sprays into both nostrils daily.  Dispense: 16 g; Refill: 6   No follow-ups on file.:  2 months

## 2023-05-30 NOTE — Progress Notes (Signed)
Subjective: Chief Complaint  Patient presents with   RFC    RFC no problems or concerns at this time.    72 y.o. returns the office today for painful, elongated, thickened toenails which he cannot trim himself.  He is on gabapentin still which helps.   Current PCP: Emilio Aspen, MD  He is on Eliquis    Objective: AAO 3, NAD DP/PT pulses palpable, CRT less than 3 seconds Sensation decreased. Nails hypertrophic, dystrophic, elongated, brittle, discolored 10. There is tenderness overlying the nails 1-5 bilaterally. There is no surrounding erythema or drainage along the nail sites.  The right hallux nail appears to have more yellow discoloration.  The other toenails. No pain with calf compression, swelling, warmth, erythema.  Assessment: Patient presents with symptomatic onychomycosis, neuropathy- on Eliquis  Plan: -Treatment options including alternatives, risks, complications were discussed -Nails sharply debrided 10 without complication/bleeding. -Continue gabapentin. -Discussed daily foot inspection. If there are any changes, to call the office immediately.  -Follow-up in 3 months or sooner if any problems are to arise. In the meantime, encouraged to call the office with any questions, concerns, changes symptoms.  Return in about 3 months (around 08/28/2023).  Ovid Curd, DPM

## 2023-05-31 ENCOUNTER — Other Ambulatory Visit: Payer: Medicare Other

## 2023-05-31 DIAGNOSIS — D649 Anemia, unspecified: Secondary | ICD-10-CM | POA: Diagnosis not present

## 2023-05-31 DIAGNOSIS — Z113 Encounter for screening for infections with a predominantly sexual mode of transmission: Secondary | ICD-10-CM | POA: Diagnosis not present

## 2023-05-31 DIAGNOSIS — E782 Mixed hyperlipidemia: Secondary | ICD-10-CM | POA: Diagnosis not present

## 2023-06-01 LAB — CBC WITH DIFFERENTIAL/PLATELET
Absolute Lymphocytes: 942 {cells}/uL (ref 850–3900)
Absolute Monocytes: 402 {cells}/uL (ref 200–950)
Basophils Absolute: 12 {cells}/uL (ref 0–200)
Basophils Relative: 0.2 %
Eosinophils Absolute: 90 {cells}/uL (ref 15–500)
Eosinophils Relative: 1.5 %
HCT: 40.5 % (ref 38.5–50.0)
Hemoglobin: 13.3 g/dL (ref 13.2–17.1)
MCH: 31.1 pg (ref 27.0–33.0)
MCHC: 32.8 g/dL (ref 32.0–36.0)
MCV: 94.6 fL (ref 80.0–100.0)
MPV: 10 fL (ref 7.5–12.5)
Monocytes Relative: 6.7 %
Neutro Abs: 4554 {cells}/uL (ref 1500–7800)
Neutrophils Relative %: 75.9 %
Platelets: 215 10*3/uL (ref 140–400)
RBC: 4.28 10*6/uL (ref 4.20–5.80)
RDW: 13.7 % (ref 11.0–15.0)
Total Lymphocyte: 15.7 %
WBC: 6 10*3/uL (ref 3.8–10.8)

## 2023-06-01 LAB — LIPID PANEL
Cholesterol: 124 mg/dL (ref <200)
HDL: 54 mg/dL (ref >=40)
LDL Cholesterol (Calc): 51 mg/dL
Non-HDL Cholesterol (Calc): 70 mg/dL (ref <130)
Total CHOL/HDL Ratio: 2.3 (calc) (ref <5.0)
Triglycerides: 103 mg/dL (ref <150)

## 2023-06-01 LAB — HEPATITIS C ANTIBODY: Hepatitis C Ab: NONREACTIVE

## 2023-06-06 DIAGNOSIS — Z961 Presence of intraocular lens: Secondary | ICD-10-CM | POA: Diagnosis not present

## 2023-06-06 DIAGNOSIS — H353131 Nonexudative age-related macular degeneration, bilateral, early dry stage: Secondary | ICD-10-CM | POA: Diagnosis not present

## 2023-06-06 DIAGNOSIS — H26491 Other secondary cataract, right eye: Secondary | ICD-10-CM | POA: Diagnosis not present

## 2023-06-06 DIAGNOSIS — H401111 Primary open-angle glaucoma, right eye, mild stage: Secondary | ICD-10-CM | POA: Diagnosis not present

## 2023-06-06 DIAGNOSIS — H401122 Primary open-angle glaucoma, left eye, moderate stage: Secondary | ICD-10-CM | POA: Diagnosis not present

## 2023-06-12 DIAGNOSIS — F411 Generalized anxiety disorder: Secondary | ICD-10-CM | POA: Diagnosis not present

## 2023-06-12 DIAGNOSIS — F332 Major depressive disorder, recurrent severe without psychotic features: Secondary | ICD-10-CM | POA: Diagnosis not present

## 2023-06-14 ENCOUNTER — Ambulatory Visit (INDEPENDENT_AMBULATORY_CARE_PROVIDER_SITE_OTHER): Payer: Medicare Other | Admitting: Otolaryngology

## 2023-06-14 ENCOUNTER — Encounter (INDEPENDENT_AMBULATORY_CARE_PROVIDER_SITE_OTHER): Payer: Self-pay | Admitting: Otolaryngology

## 2023-06-14 VITALS — BP 143/95 | HR 95 | Ht 75.0 in | Wt 213.6 lb

## 2023-06-14 DIAGNOSIS — J3089 Other allergic rhinitis: Secondary | ICD-10-CM | POA: Diagnosis not present

## 2023-06-14 DIAGNOSIS — R0981 Nasal congestion: Secondary | ICD-10-CM | POA: Diagnosis not present

## 2023-06-14 DIAGNOSIS — Z9889 Other specified postprocedural states: Secondary | ICD-10-CM | POA: Diagnosis not present

## 2023-06-14 DIAGNOSIS — R0982 Postnasal drip: Secondary | ICD-10-CM | POA: Diagnosis not present

## 2023-06-14 DIAGNOSIS — G4733 Obstructive sleep apnea (adult) (pediatric): Secondary | ICD-10-CM

## 2023-06-14 MED ORDER — FLUTICASONE PROPIONATE 50 MCG/ACT NA SUSP: 2.0000 | Freq: Every day | NASAL | 6 refills | Status: DC

## 2023-06-14 MED ORDER — CETIRIZINE HCL 10 MG PO TABS: 10.0000 mg | ORAL_TABLET | Freq: Every day | ORAL | 11 refills | Status: DC

## 2023-06-14 MED ORDER — SALINE SPRAY 0.65 % NA SOLN: 1.0000 | NASAL | 5 refills | Status: DC | PRN

## 2023-06-14 NOTE — Progress Notes (Signed)
ENT CONSULT:  Reason for Consult: nasal obstruction and nasal congestion    HPI: Discussed Francisco use of AI scribe software for clinical note transcription with Francisco Padilla, who gave verbal consent to proceed.  History of Present Illness   Francisco Padilla is a 72 yoM, with a history of septoplasty with Dr Suszanne Conners 11/13/2022, presents with concerns about post-operative bleeding and nasal congestion. Francisco Padilla report a significant bleeding episode after Francisco surgery, which required a visit to Francisco emergency room and admission which lasted for four days. Francisco Padilla attributes this to their known tendency to bleed easily and Francisco use of Eliquis, an anticoagulant medication. Francisco Padilla also report a new onset of nasal congestion following Francisco surgery.  Francisco Padilla has a history of sleep apnea and uses a CPAP machine, but has now been able to sleep without it for Francisco past week due to nasal congestion. Francisco Padilla also have a history of bariatric surgery and have lost significant weight.   Francisco Padilla also reports a recent episode of anemia with a hemoglobin level of 8.1, which lasted for 42 days (normal H&H recently). Francisco Padilla report difficulty in obtaining an iron infusion during this time. Francisco Padilla reports cognitive effects from Francisco anemia, including difficulty concentrating and memory issues, which Francisco Padilla feel are just starting to resolve.  Francisco Padilla is currently using a prescribed nasal spray, which Francisco Padilla use before bed. Francisco Padilla also report using a nasal rinse, which Francisco Padilla find helpful. Francisco Padilla denies any recent nosebleeds since Francisco initial post-operative episode.     Records Reviewed:  Neurology [facility] Mild cognitive impairment History of bariatric surgery with more than 180 pound weight loss Long history of obstructive sleep apnea,             Francisco Padilla complaints of memory loss can be multifactorial, including stress, poor sleep quality, chronic insomnia, worsening depression anxiety, CT also showed chronic small vessel disease,              MRI of Francisco brain             Laboratory evaluation for treatable etiology     MEDICAL HISTORY:   Francisco Padilla is a 72 year old male,, seen in request by her primary care physician from [facility] Dr.   Lucianne Muss, Larina Bras, for evaluation of memory loss, initial evaluation was February 28, 2023  I reviewed and summarized Francisco referring note. PMHX. Atrial fibrillation, on eliquis Depression, anxiety HLD ADD Chronic Insomnia S/p Bariatric surgery in Sept 2008, lost weight 180 Lb   Francisco Padilla reported significant stress over Francisco past few years, Francisco Padilla lost Francisco Padilla partner of more than 49 years in November 2023, prior to that, Francisco Padilla was Francisco main caregiver of him who suffered stroke for more than 11 years, since then, Francisco Padilla had experienced many personal medical issues, stress of making decision alone, Francisco Padilla now lives alone at home, managing multiple property, farm    Op Note by Dr Suszanne Conners 11/13/22 PREOPERATIVE DIAGNOSES:  1. Severe nasal septal deviation.  2. Bilateral inferior turbinate hypertrophy.  3. Chronic nasal obstruction.   POSTOPERATIVE DIAGNOSES:  1. Severe nasal septal deviation.  2. Bilateral inferior turbinate hypertrophy.  3. Chronic nasal obstruction.   PROCEDURE PERFORMED:  1. Septoplasty.  2. Bilateral partial inferior turbinate resection.    Past Medical History:  Diagnosis Date   ADD (attention deficit disorder)    Anxiety 2007   Asthma as child   Asymptomatic gallstones    Atrial fibrillation (HCC)    Atrial flutter (HCC)  Depression    DJD (degenerative joint disease) of knee    Dysrhythmia    Chronic Atrial Fib- seeing Dr. Shela Commons [facility]   H/O peptic ulcer    past history with GI bleed- cauterization done throught scope   History of kidney stones    pt claims urology discovered a kidney stone last week 05/25/21   Hypercholesterolemia    Orthostatic hypotension    Pneumonia as child   Pneumothorax on left 05/20/2009   Prediabetes    none now   Pulmonary nodules     Sleep apnea    cpap set on 13   Thrombocytopenia (HCC)    pt denies   Thyromegaly    Varicose veins     Past Surgical History:  Procedure Laterality Date   BIOPSY  04/18/2018   Procedure: BIOPSY;  Surgeon: Kerin Salen, MD;  Location: [facility];  Service: Gastroenterology;;   ESOPHAGOGASTRODUODENOSCOPY N/A 04/18/2018   Procedure: ESOPHAGOGASTRODUODENOSCOPY (EGD);  Surgeon: Kerin Salen, MD;  Location: [facility];  Service: Gastroenterology;  Laterality: N/A;   ESOPHAGOGASTRODUODENOSCOPY (EGD) WITH PROPOFOL N/A 01/05/2015   Procedure: ESOPHAGOGASTRODUODENOSCOPY (EGD) WITH PROPOFOL;  Surgeon: Charolett Bumpers, MD;  Location: [facility];  Service: Endoscopy;  Laterality: N/A;   EXTRACORPOREAL SHOCK WAVE LITHOTRIPSY Left 08/11/2021   Procedure: LEFT EXTRACORPOREAL SHOCK WAVE LITHOTRIPSY (ESWL);  Surgeon: Jannifer Hick, MD;  [facility]  Service: Urology;  Laterality: Left;   GASTRIC ROUX-EN-Y     '08-[name] (weight stable around 302 #   KNEE ARTHROSCOPY     NASAL SEPTOPLASTY W/ TURBINOPLASTY Bilateral 11/13/2022   Procedure: NASAL SEPTOPLASTY WITH BILATERAL TURBINATE REDUCTION;  Surgeon: Newman Pies, MD;  Location: [facility];  Service: ENT;  Laterality: Bilateral;   TONSILLECTOMY     TOTAL KNEE ARTHROPLASTY Left 06/03/2021   Procedure: LEFT TOTAL KNEE ARTHROPLASTY;  Surgeon: Nadara Mustard, MD;  Location: [facility] OR;  Service: Orthopedics;  Laterality: Left;   ULNAR NERVE TRANSPOSITION Right 15 yrs ago    Family History  Problem Relation Age of Onset   Heart disease Mother    Heart failure Mother    Heart disease Father    Heart attack Father    Stroke Neg Hx     Social History:  reports that Francisco Padilla has never smoked. Francisco Padilla has never used smokeless tobacco. Francisco Padilla reports that Francisco Padilla does not drink alcohol and does not use drugs.  Allergies:  Allergies  Allergen Reactions   Aspirin     Avoid due to bariatric surgery   Nsaids     Avoid due to bariatric  surgery   Oxycodone Hcl     Hallucinations, agitation     Medications: I have reviewed Francisco Padilla's current medications.  Francisco PMH, PSH, Medications, Allergies, and SH were reviewed and updated.  ROS: Constitutional: Negative for fever, weight loss and weight gain. Cardiovascular: Negative for chest pain and dyspnea on exertion. Respiratory: Is not experiencing shortness of breath at rest. Gastrointestinal: Negative for nausea and vomiting. Neurological: Negative for headaches. Psychiatric: Francisco Padilla is not nervous/anxious  Blood pressure (!) 143/95, pulse 95, height 6\' 3"  (1.905 m), weight 213 lb 9.6 oz (96.9 kg), SpO2 99%.  PHYSICAL EXAM:  Exam: General: Well-developed, well-nourished Communication and Voice: Clear pitch and clarity Respiratory Respiratory effort: Equal inspiration and expiration without stridor Cardiovascular Peripheral Vascular: Warm extremities with equal color/perfusion Eyes: No nystagmus with equal extraocular motion bilaterally Neuro/Psych/Balance: Padilla oriented to person, place, and time; Appropriate mood and affect;  Gait is intact with no imbalance; Cranial nerves I-XII are intact Head and Face Inspection: Normocephalic and atraumatic without mass or lesion Palpation: Facial skeleton intact without bony stepoffs Salivary Glands: No mass or tenderness Facial Strength: Facial motility symmetric and full bilaterally ENT Pinna: External ear intact and fully developed External canal: Canal is patent with intact skin Tympanic Membrane: Clear and mobile External Nose: No scar or anatomic deformity Internal Nose: Septum is straight. No polyp, or purulence. Mucosal edema and erythema present.  Bilateral inferior turbinate hypertrophy.  Lips, Teeth, and gums: Mucosa and teeth intact and viable TMJ: No pain to palpation with full mobility Oral cavity/oropharynx: No erythema or exudate, no lesions present Nasopharynx: No mass or lesion with intact  mucosa Hypopharynx: Intact mucosa without pooling of secretions Neck Neck and Trachea: Midline trachea without mass or lesion Thyroid: No mass or nodularity Lymphatics: No lymphadenopathy  Procedure:   PROCEDURE NOTE: nasal endoscopy  Preoperative diagnosis: chronic nasal congestion symptoms  Postoperative diagnosis: same  Procedure: Diagnostic nasal endoscopy (16109)  Surgeon: Ashok Croon, M.D.  Anesthesia: Topical lidocaine and Afrin  H&P REVIEW: Francisco Padilla's history and physical were reviewed today prior to procedure. All medications were reviewed and updated as well. Complications: None Condition is stable throughout exam Indications and consent: Francisco Padilla presents with symptoms of chronic sinusitis not responding to previous therapies. All Francisco risks, benefits, and potential complications were reviewed with Francisco Padilla preoperatively and informed consent was obtained. Francisco time out was completed with confirmation of Francisco correct procedure.   Procedure: Francisco Padilla was seated upright in Francisco clinic. Topical lidocaine and Afrin were applied to Francisco nasal cavity. After adequate anesthesia had occurred, Francisco rigid nasal endoscope was passed into Francisco nasal cavity. Francisco nasal mucosa, turbinates, septum, and sinus drainage pathways were visualized bilaterally. This revealed no purulence or significant secretions that might be cultured. There were no polyps or sites of significant inflammation. Francisco mucosa was intact and there was no crusting present. Francisco scope was then slowly withdrawn and Francisco Padilla tolerated Francisco procedure well. There were no complications or blood loss.  Studies Reviewed: CBC results   04/03/23 MRI brain w/o contrast     Assessment/Plan: Encounter Diagnoses  Name Primary?   Chronic nasal congestion Yes   History of nasal septoplasty    Environmental and seasonal allergies    Post-nasal drip     Assessment and Plan    Chronic Nasal Congestion, sx  recurrence more recently. Had normal nasal breathing following septoplasty 11/13/2022 with Dr Suszanne Conners. Reports nasal congestion and drainage, which started post-surgery. Nasal examination including nasal endoscopy with a straight septum and a clear nasal passage and no significant obstruction, no purulence or polyps, no evidence of inferior turbinate hypertrophy. We discussed that Francisco Padilla congestion is not related to Francisco surgery. Discussed Francisco use of nasal spray, allergy pills, and Francisco benefits of nasal saline rinses to wash out allergens and reduce irritation. - Zyrtec 10 mg daily and Flonase 2 puffs b/l nares BID - Recommend nasal saline rinses as needed  Epistaxis, post-op in Francisco setting of anticoagulation   Bleeding resolved after discontinuation of Eliquis. No further episodes reported. Discussed Francisco risk of bleeding with continued Eliquis use and Francisco importance of monitoring for recurrence. - Monitor for recurrence of epistaxis - No episodes of epistaxis since Francisco immediate post-op period  Obstructive Sleep Apnea Obstructive sleep apnea managed with CPAP. Lost 180 pounds following bariatric surgery and reports improved sleep without CPAP for Francisco past week.  Discussed Francisco potential need for a sleep study to reassess sleep apnea status and evaluate Francisco need for continued CPAP use. - Evaluate Francisco need for continued CPAP use - Consider sleep study to reassess sleep apnea status  General Health Maintenance Managing multiple health conditions post-bariatric surgery with significant weight loss. Under Francisco care of a new primary care physician. Discussed Francisco importance of regular follow-ups and maintaining Francisco current health regimen. - Continue regular follow-ups with primary care physician - Maintain current health regimen and monitor for new symptoms.     Thank you for allowing me to participate in Francisco care of this Padilla. Please do not hesitate to contact me with any questions or concerns.   Ashok Croon, MD Otolaryngology [facility] Phone: [phone] Fax: [phone]    06/14/2023, 2:48 PM

## 2023-06-25 DIAGNOSIS — F411 Generalized anxiety disorder: Secondary | ICD-10-CM | POA: Diagnosis not present

## 2023-06-25 DIAGNOSIS — F332 Major depressive disorder, recurrent severe without psychotic features: Secondary | ICD-10-CM | POA: Diagnosis not present

## 2023-07-02 ENCOUNTER — Telehealth: Payer: Self-pay | Admitting: Cardiology

## 2023-07-02 DIAGNOSIS — R55 Syncope and collapse: Secondary | ICD-10-CM

## 2023-07-02 DIAGNOSIS — F332 Major depressive disorder, recurrent severe without psychotic features: Secondary | ICD-10-CM | POA: Diagnosis not present

## 2023-07-02 DIAGNOSIS — F411 Generalized anxiety disorder: Secondary | ICD-10-CM | POA: Diagnosis not present

## 2023-07-02 NOTE — Telephone Encounter (Signed)
Spoke with pt who reports Dr Anne Fu knows everything about him and he only wants to be seen by him.  He reports he has only met his PCP once and she doesn't know him well.   Advised Dr Anne Fu is not working in the office this week and is working at the hospital.  Algis Downs pt he needs to report to closest ED for further evaluation.  Pt reports he does not feel this is necessary at this time and if he thinks he is having a stroke then he will go.  Advised pt I will forward this information to Dr Anne Fu as requested for his review however I am not sure how soon that it will be reviewed.  Pt states understanding and again requests Dr Anne Fu receive this information for review.   Of note - pt was in his "therapists" office at the time of the call and  stated he had answered all these (my) questions already when he spoke with the other lady before.   Will forward this information despite not having more knowledge of pt's current situation.
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
## 2023-07-03 ENCOUNTER — Ambulatory Visit: Payer: Medicare Other | Attending: Cardiology

## 2023-07-03 ENCOUNTER — Telehealth: Payer: Self-pay

## 2023-07-03 DIAGNOSIS — R55 Syncope and collapse: Secondary | ICD-10-CM

## 2023-07-03 NOTE — Telephone Encounter (Signed)
Spoke with patient and discussed Dr. Anne Fu' recommendations for 2 week Zio heart monitor and echo for syncope.  Reviewed heart monitor instructions in detail, will be mailed to patient's home and he plans to put on evening of 12/15 or 12/16. Scheduler to call patient to schedule echo.  Patient verbalized understanding and expressed appreciation. Gives his regards to Dr. Anne Fu.

## 2023-07-03 NOTE — Telephone Encounter (Signed)
Patient called stating it is time to have his gabapentin refilled. He would ike to know if he can increase to taking 8 to ten capsules instead of the 4. He also stated that he will take what ever you think his maximum dose should be. He will only be taking it at bedtime.He says that his left foot is completely numb and someone told him increasing the dose should be okay.

## 2023-07-03 NOTE — Progress Notes (Unsigned)
Enrolled for [employer] to mail a ZIO XT long term holter monitor to the patients address on file.   ZIO XT serial # [id number] mailed to patient and applied in office 07/10/23.

## 2023-07-04 ENCOUNTER — Other Ambulatory Visit: Payer: Self-pay | Admitting: Podiatry

## 2023-07-04 MED ORDER — GABAPENTIN 300 MG PO CAPS: 600.0000 mg | ORAL_CAPSULE | Freq: Every day | ORAL | 3 refills | Status: DC

## 2023-07-09 ENCOUNTER — Telehealth: Payer: Self-pay | Admitting: Cardiology

## 2023-07-09 NOTE — Telephone Encounter (Signed)
Pt would like help putting on heart monitor. Please advise

## 2023-07-09 NOTE — Telephone Encounter (Signed)
Scheduled 07/10/23, 12:30 PM to have ZIO XT applied.

## 2023-07-10 ENCOUNTER — Other Ambulatory Visit: Payer: Self-pay | Admitting: Podiatry

## 2023-07-10 ENCOUNTER — Ambulatory Visit: Payer: Medicare Other | Attending: Internal Medicine

## 2023-07-10 DIAGNOSIS — R55 Syncope and collapse: Secondary | ICD-10-CM | POA: Diagnosis not present

## 2023-07-10 DIAGNOSIS — N5201 Erectile dysfunction due to arterial insufficiency: Secondary | ICD-10-CM | POA: Diagnosis not present

## 2023-07-10 DIAGNOSIS — N200 Calculus of kidney: Secondary | ICD-10-CM | POA: Diagnosis not present

## 2023-07-10 MED ORDER — GABAPENTIN 300 MG PO CAPS: 600.0000 mg | ORAL_CAPSULE | Freq: Every day | ORAL | 0 refills | Status: AC

## 2023-07-10 NOTE — Progress Notes (Signed)
Received request from pharmacy for 90 day supply of gabapentin. Sent to pharmacy.

## 2023-07-12 ENCOUNTER — Telehealth: Payer: Self-pay | Admitting: Gastroenterology

## 2023-07-12 DIAGNOSIS — Z7901 Long term (current) use of anticoagulants: Secondary | ICD-10-CM | POA: Diagnosis not present

## 2023-07-12 DIAGNOSIS — Z886 Allergy status to analgesic agent status: Secondary | ICD-10-CM | POA: Diagnosis not present

## 2023-07-12 DIAGNOSIS — K8020 Calculus of gallbladder without cholecystitis without obstruction: Secondary | ICD-10-CM | POA: Diagnosis not present

## 2023-07-12 DIAGNOSIS — G629 Polyneuropathy, unspecified: Secondary | ICD-10-CM | POA: Diagnosis not present

## 2023-07-12 DIAGNOSIS — K529 Noninfective gastroenteritis and colitis, unspecified: Secondary | ICD-10-CM | POA: Diagnosis not present

## 2023-07-12 DIAGNOSIS — I4891 Unspecified atrial fibrillation: Secondary | ICD-10-CM | POA: Diagnosis not present

## 2023-07-12 DIAGNOSIS — F32A Depression, unspecified: Secondary | ICD-10-CM | POA: Diagnosis not present

## 2023-07-12 DIAGNOSIS — Z888 Allergy status to other drugs, medicaments and biological substances status: Secondary | ICD-10-CM | POA: Diagnosis not present

## 2023-07-12 DIAGNOSIS — N200 Calculus of kidney: Secondary | ICD-10-CM | POA: Diagnosis not present

## 2023-07-12 DIAGNOSIS — Z885 Allergy status to narcotic agent status: Secondary | ICD-10-CM | POA: Diagnosis not present

## 2023-07-12 DIAGNOSIS — R911 Solitary pulmonary nodule: Secondary | ICD-10-CM | POA: Diagnosis not present

## 2023-07-12 DIAGNOSIS — Z79899 Other long term (current) drug therapy: Secondary | ICD-10-CM | POA: Diagnosis not present

## 2023-07-12 DIAGNOSIS — R162 Hepatomegaly with splenomegaly, not elsewhere classified: Secondary | ICD-10-CM | POA: Diagnosis not present

## 2023-07-12 DIAGNOSIS — Z9884 Bariatric surgery status: Secondary | ICD-10-CM | POA: Diagnosis not present

## 2023-07-12 DIAGNOSIS — E7800 Pure hypercholesterolemia, unspecified: Secondary | ICD-10-CM | POA: Diagnosis not present

## 2023-07-12 DIAGNOSIS — N281 Cyst of kidney, acquired: Secondary | ICD-10-CM | POA: Diagnosis not present

## 2023-07-12 NOTE — Telephone Encounter (Signed)
Inbound call from patient requesting to schedule appointment for loose bowel. Patient last seen with Dr. Ewing Schlein in 2022. Advised patient we would require previous records prior to scheduling. Also advised patient soonest available appointment at this time is in March 2025. Patient was made aware that there are on call GI providers at [facility] and [facility] and when we received previous records we can try to get him an appointment as soon as possible.

## 2023-07-13 ENCOUNTER — Telehealth: Payer: Self-pay

## 2023-07-13 NOTE — Telephone Encounter (Addendum)
Patient called and states that he's having diarrhea and anything he eats come right back out. He went to [facility] ER in [place] and they did testing and suggested he gets in touch with PCP [name], MD. So that we can set up appointment with Gastroenterology. I advised patient that he would have to be seen by PCP. He said that he already spoke to someone in office and appointment for 07/19/2023 has been set up. I wasn't aware because there was no documentation of a phone call. Message routed to PCP as FYI. No further action is required.

## 2023-07-19 ENCOUNTER — Ambulatory Visit (INDEPENDENT_AMBULATORY_CARE_PROVIDER_SITE_OTHER): Payer: Medicare Other | Admitting: Family

## 2023-07-19 ENCOUNTER — Encounter: Payer: Self-pay | Admitting: Family

## 2023-07-19 VITALS — BP 116/62 | HR 90 | Temp 97.8°F | Resp 20 | Ht 76.0 in | Wt 203.8 lb

## 2023-07-19 DIAGNOSIS — N200 Calculus of kidney: Secondary | ICD-10-CM

## 2023-07-19 DIAGNOSIS — R197 Diarrhea, unspecified: Secondary | ICD-10-CM

## 2023-07-19 DIAGNOSIS — G4733 Obstructive sleep apnea (adult) (pediatric): Secondary | ICD-10-CM

## 2023-07-19 DIAGNOSIS — R911 Solitary pulmonary nodule: Secondary | ICD-10-CM | POA: Diagnosis not present

## 2023-07-19 NOTE — Progress Notes (Signed)
Provider: [name]  Francisco Sheffield, MD  Patient Care Team: Francisco Sheffield, MD as PCP - General (Internal Medicine) Francisco Bathe, MD as PCP - Cardiology (Cardiology)  Extended Emergency Contact Information Primary Emergency Contact: Francisco Padilla,Francisco Padilla Mobile Phone: [phone] Relation: Friend  Code Status:  Full Code  Goals of care: Advanced Directive information    05/29/2023    9:31 AM  Advanced Directives  Does Patient Have a Medical Advance Directive? Yes  Type of Advance Directive Out of facility DNR (pink MOST or yellow form)  Does patient want to make changes to medical advance directive? No - Patient declined     Chief Complaint  Patient presents with   Acute Visit    Patient presents today for a emergency department follow-up at [facility] on 07/12/23 for frequent bowel movement. He reports weakness.    HPI:  Pt is a 72 y.o. male seen today for an acute visit for ED 07/12/2023 follow up for frequent bowel movement.states still having generalized weakness.Had no controlled of bowel movement  Not much diarrhea.states not eating much.states as soon as he eats he has bowel movement.Had salmon,marsh potatoes last night.tolerated well.Also tries to eat Jello,rice,bread and Bananas. He denies any abdominal pain /cramping.Nausea.vomiting,fever,chills,constipation or blood in the stool.  Lung nodule - Noted on recent CT scan  CT abdomen pelvis with contrast also showed Bilateral renal cysts, largest in the left interpolar region measuring 4.9 x 4.7 cm. Nonobstructing bilateral renal calculi, largest in the right lower pole measuring 0.6 cm. No hydronephrosis.    Past Medical History:  Diagnosis Date   ADD (attention deficit disorder)    Anxiety 2007   Asthma as child   Asymptomatic gallstones    Atrial fibrillation (HCC)    Atrial flutter (HCC)    Depression    DJD (degenerative joint disease) of knee    Dysrhythmia    Chronic Atrial  Fib- seeing Dr. Shela Francisco Padilla [facility]   H/O peptic ulcer    past history with GI bleed- cauterization done throught scope   History of kidney stones    pt claims urology discovered a kidney stone last week 05/25/21   Hypercholesterolemia    Orthostatic hypotension    Pneumonia as child   Pneumothorax on left 05/20/2009   Prediabetes    none now   Pulmonary nodules    Sleep apnea    cpap set on 13   Thrombocytopenia (HCC)    pt denies   Thyromegaly    Varicose veins    Past Surgical History:  Procedure Laterality Date   BIOPSY  04/18/2018   Procedure: BIOPSY;  Surgeon: Francisco Salen, MD;  Location: [facility];  Service: Gastroenterology;;   ESOPHAGOGASTRODUODENOSCOPY N/A 04/18/2018   Procedure: ESOPHAGOGASTRODUODENOSCOPY (EGD);  Surgeon: Francisco Salen, MD;  Location: [facility];  Service: Gastroenterology;  Laterality: N/A;   ESOPHAGOGASTRODUODENOSCOPY (EGD) WITH PROPOFOL N/A 01/05/2015   Procedure: ESOPHAGOGASTRODUODENOSCOPY (EGD) WITH PROPOFOL;  Surgeon: Francisco Bumpers, MD;  Location: [facility];  Service: Endoscopy;  Laterality: N/A;   EXTRACORPOREAL SHOCK WAVE LITHOTRIPSY Left 08/11/2021   Procedure: LEFT EXTRACORPOREAL SHOCK WAVE LITHOTRIPSY (ESWL);  Surgeon: Francisco Hick, MD;  Location: [facility];  Service: Urology;  Laterality: Left;   GASTRIC ROUX-EN-Y     '08-[name] (weight stable around 302 #   KNEE ARTHROSCOPY     NASAL SEPTOPLASTY W/ TURBINOPLASTY Bilateral 11/13/2022   Procedure: NASAL SEPTOPLASTY WITH BILATERAL TURBINATE REDUCTION;  Surgeon: Francisco Pies, MD;  Location: [facility];  Service: ENT;  Laterality: Bilateral;   TONSILLECTOMY     TOTAL KNEE ARTHROPLASTY Left 06/03/2021   Procedure: LEFT TOTAL KNEE ARTHROPLASTY;  Surgeon: Francisco Mustard, MD;  Location: [facility];  Service: Orthopedics;  Laterality: Left;   ULNAR NERVE TRANSPOSITION Right 15 yrs ago    Allergies  Allergen Reactions   Aspirin     Avoid due to bariatric  surgery   Nsaids     Avoid due to bariatric surgery   Oxycodone Hcl     Hallucinations, agitation     Outpatient Encounter Medications as of 07/19/2023  Medication Sig   acetaminophen (TYLENOL) 650 MG CR tablet Take 1,300 mg by mouth every 8 (eight) hours.   alfuzosin (UROXATRAL) 10 MG 24 hr tablet Take 10 mg by mouth daily with breakfast.   apixaban (ELIQUIS) 5 MG TABS tablet Take 5 mg by mouth 2 (two) times daily.   atorvastatin (LIPITOR) 20 MG tablet Take 20 mg by mouth at bedtime.   cetirizine (ZYRTEC) 10 MG tablet Take 1 tablet (10 mg total) by mouth daily.   Coenzyme Q10 (CO Q 10 PO) Take 1 capsule by mouth daily.   desvenlafaxine (PRISTIQ) 25 MG 24 hr tablet Take 12.5 mg by mouth every other day.   fludrocortisone (FLORINEF) 0.1 MG tablet Take 0.1 mg by mouth daily.   fluticasone (FLONASE) 50 MCG/ACT nasal spray Place 2 sprays into both nostrils daily.   gabapentin (NEURONTIN) 300 MG capsule Take 2 capsules (600 mg total) by mouth at bedtime.   Glucosamine-Chondroitin (COSAMIN DS PO) Take 1,500 mg by mouth daily. Rotate with other Joint Health   latanoprost (XALATAN) 0.005 % ophthalmic solution Place 1 drop into both eyes at bedtime.   LORazepam (ATIVAN) 1 MG tablet Take 0.5 mg by mouth every 4 (four) hours as needed.   Melatonin 10 MG TABS Take 1 tablet by mouth at bedtime.   Misc Natural Products (OSTEO BI-FLEX ADV TRIPLE ST PO) Take 1 tablet by mouth daily.   Multiple Vitamins-Minerals (ONE DAILY MULTIVITAMIN MEN) TABS Take 1 tablet by mouth 2 (two) times daily. CENTRUM SILVER   pantoprazole (PROTONIX) 40 MG tablet Take 40 mg by mouth in the morning and at bedtime.   Probiotic Product (PROBIOTIC PO) Take 1 capsule by mouth daily.   sildenafil (REVATIO) 20 MG tablet Take 20 mg by mouth daily as needed.   sodium chloride (OCEAN) 0.65 % SOLN nasal spray Place 1 spray into both nostrils as needed.   SUPER B COMPLEX/C PO Take 1 tablet by mouth daily.   zolpidem (AMBIEN) 10 MG  tablet Take 5-10 mg by mouth at bedtime.   No facility-administered encounter medications on file as of 07/19/2023.    Review of Systems  Constitutional:  Negative for appetite change, chills, fatigue, fever and unexpected weight change.  HENT:  Negative for congestion, dental problem, ear discharge, ear pain, facial swelling, hearing loss, nosebleeds, postnasal drip, rhinorrhea, sinus pressure, sinus pain, sneezing, sore throat, tinnitus and trouble swallowing.   Eyes:  Negative for pain, discharge, redness, itching and visual disturbance.  Respiratory:  Negative for cough, chest tightness, shortness of breath and wheezing.   Cardiovascular:  Negative for chest pain, palpitations and leg swelling.  Gastrointestinal:  Negative for abdominal distention, abdominal pain, blood in stool, constipation, nausea and vomiting.       Frequent bowel movement per HPI   Endocrine: Negative for cold intolerance, heat intolerance, polydipsia, polyphagia and polyuria.  Genitourinary:  Negative for difficulty  urinating, dysuria, flank pain, frequency and urgency.  Musculoskeletal:  Negative for arthralgias, back pain, gait problem, joint swelling, myalgias, neck pain and neck stiffness.  Skin:  Negative for color change, pallor, rash and wound.  Neurological:  Negative for dizziness, syncope, speech difficulty, weakness, light-headedness, numbness and headaches.  Hematological:  Does not bruise/bleed easily.  Psychiatric/Behavioral:  Negative for agitation, behavioral problems, confusion, hallucinations, self-injury, sleep disturbance and suicidal ideas. The patient is not nervous/anxious.        Memory loss     Immunization History  Administered Date(s) Administered   Fluad Quad(high Dose 65+) 05/15/2023   Fluzone Influenza virus vaccine,trivalent (IIV3), split virus 04/13/2009, 04/20/2010, 04/17/2011, 05/01/2012, 03/14/2013, 03/18/2014, 03/04/2015, 04/01/2017, 03/16/2018, 03/12/2019, 03/29/2020    Influenza, High Dose Seasonal PF 03/12/2019, 04/23/2021   Influenza,inj,Quad PF,6+ Mos 04/11/2016   PFIZER(Purple Top)SARS-COV-2 Vaccination 08/08/2019, 08/25/2019, 04/13/2020, 10/27/2020   Pneumococcal Conjugate-13 02/26/2018   Pneumococcal Polysaccharide-23 04/28/1999, 03/10/2020   Td 05/06/2004   Tdap 02/02/2014   Zoster Recombinant(Shingrix) 06/23/2017   Zoster, Live 01/09/2007, 01/03/2017, 06/23/2017   Pertinent  Health Maintenance Due  Topic Date Due   Colonoscopy  02/02/2032   INFLUENZA VACCINE  Completed      06/09/2021    9:00 AM 06/09/2021    7:15 PM 06/10/2021   10:00 AM 08/11/2021    9:06 AM 05/29/2023    9:31 AM  Fall Risk  Falls in the past year?     1  Was there an injury with Fall?     0  Fall Risk Category Calculator     1  (RETIRED) Patient Fall Risk Level Moderate fall risk Moderate fall risk Moderate fall risk Moderate fall risk   Patient at Risk for Falls Due to     Impaired balance/gait;Impaired mobility  Fall risk Follow up     Falls evaluation completed;Education provided;Falls prevention discussed   Functional Status Survey:    Vitals:   07/19/23 1015  BP: 116/62  Pulse: 90  Resp: 20  Temp: 97.8 F (36.6 C)  SpO2: 98%  Weight: 203 lb 12.8 oz (92.4 kg)  Height: 6\' 4"  (1.93 m)   Body mass index is 24.81 kg/m. Physical Exam Vitals reviewed.  Constitutional:      General: He is not in acute distress.    Appearance: Normal appearance. He is normal weight. He is not ill-appearing or diaphoretic.  HENT:     Head: Normocephalic.     Right Ear: Tympanic membrane, ear canal and external ear normal. There is no impacted cerumen.     Left Ear: Tympanic membrane, ear canal and external ear normal. There is no impacted cerumen.     Nose: Nose normal. No congestion or rhinorrhea.     Mouth/Throat:     Mouth: Mucous membranes are moist.     Pharynx: Oropharynx is clear. No oropharyngeal exudate or posterior oropharyngeal erythema.  Eyes:     General:  No scleral icterus.       Right eye: No discharge.        Left eye: No discharge.     Extraocular Movements: Extraocular movements intact.     Conjunctiva/sclera: Conjunctivae normal.     Pupils: Pupils are equal, round, and reactive to light.  Neck:     Vascular: No carotid bruit.  Cardiovascular:     Rate and Rhythm: Normal rate and regular rhythm.     Pulses: Normal pulses.     Heart sounds: Normal heart sounds. No murmur heard.  No friction rub. No gallop.     Comments: Heart monitor in place  Pulmonary:     Effort: Pulmonary effort is normal. No respiratory distress.     Breath sounds: Normal breath sounds. No wheezing, rhonchi or rales.  Chest:     Chest wall: No tenderness.  Abdominal:     General: Bowel sounds are normal. There is no distension.     Palpations: Abdomen is soft. There is no mass.     Tenderness: There is no abdominal tenderness. There is no right CVA tenderness, left CVA tenderness, guarding or rebound.  Musculoskeletal:        General: No swelling or tenderness. Normal range of motion.     Cervical back: Normal range of motion. No rigidity or tenderness.     Right lower leg: No edema.     Left lower leg: No edema.  Lymphadenopathy:     Cervical: No cervical adenopathy.  Skin:    General: Skin is warm and dry.     Coloration: Skin is not pale.     Findings: No bruising, erythema, lesion or rash.  Neurological:     Mental Status: He is alert. Mental status is at baseline.     Cranial Nerves: No cranial nerve deficit.     Sensory: No sensory deficit.     Motor: No weakness.     Coordination: Coordination normal.     Gait: Gait normal.  Psychiatric:        Mood and Affect: Mood normal.        Speech: Speech normal.        Behavior: Behavior normal.        Thought Content: Thought content normal.        Judgment: Judgment normal.     Labs reviewed: Recent Labs    11/22/22 0951 12/07/22 1548  NA 139 138  K 3.9 3.7  CL 108 105  CO2 25 26   GLUCOSE 120* 108*  BUN 30* 23  CREATININE 0.74 0.78  CALCIUM 8.8* 8.9   Recent Labs    12/07/22 1548  AST 17  ALT 15  ALKPHOS 54  BILITOT 0.5  PROT 6.0*  ALBUMIN 3.5   Recent Labs    12/07/22 1548 02/28/23 1537 05/31/23 1555  WBC 6.4 7.1 6.0  NEUTROABS 4.4 5.2 4,554  HGB 8.0* 13.1 13.3  HCT 25.8* 40.8 40.5  MCV 91.2 86 94.6  PLT 295 216 215   Lab Results  Component Value Date   TSH 1.080 02/28/2023   No results found for: "HGBA1C" Lab Results  Component Value Date   CHOL 124 05/31/2023   HDL 54 05/31/2023   LDLCALC 51 05/31/2023   TRIG 103 05/31/2023   CHOLHDL 2.3 05/31/2023    Significant Diagnostic Results in last 30 days:  No results found.  Assessment/Plan 1. Diarrhea, unspecified type (Primary) Ongoing symptoms status post ED visit for frequent bowel  - continue on bland diet information given on AVS and advance diet as tolerated  - encouraged to increase fluid intake  Will obtain lab work   - CBC with Differential/Platelet - COMPLETE METABOLIC PANEL WITH GFR - Ambulatory referral to Gastroenterology  2. Right lower lobe pulmonary nodule Noted on CT scan during recent ED visit  - Ambulatory referral to Pulmonology  3. Renal calculi Noted on recent ABD/pelvis CT scan in the ED  Will refer to Nephrologist for further evaluation   4. Obstructive sleep apnea Request sleep study but would like  pulmonology evaluation.  - Ambulatory referral to Pulmonology  Family/ staff Communication: Reviewed plan of care with patient verbalized understanding   Labs/tests ordered: - CBC with Differential/Platelet - COMPLETE METABOLIC PANEL WITH GFR  Next Appointment: Return if symptoms worsen or fail to improve.   Francisco Bookman, NP

## 2023-07-19 NOTE — Patient Instructions (Signed)
Bland Diet A bland diet may consist of soft foods or foods that are not high in fat or are not greasy, acidic, or spicy. Avoiding certain foods may cause less irritation to your mouth, throat, stomach, or gastrointestinal tract. Avoiding certain foods may make you feel better. Everyone's tolerances are different. A bland diet should be based on what you can tolerate and what may cause discomfort. What is my plan? Your health care provider or dietitian may recommend specific changes to your diet to treat your symptoms. These changes may include: Eating small meals frequently. Cooking food until it is soft enough to chew easily. Taking the time to chew your food thoroughly, so it is easy to swallow and digest. Avoiding foods that cause you discomfort. These may include spicy food, fried food, greasy foods, hard-to-chew foods, or citrus fruits and juices. Drinking slowly. What are tips for following this plan? Reading food labels To reduce fiber intake, look for food labels that say "whole," such as whole wheat or whole grain. Shopping Avoid food items that may have nuts or seeds. Avoid vegetables that may make you gassy or have a tough texture, such as broccoli, cauliflower, or corn. Cooking Cook foods thoroughly so they have a soft texture. Meal planning Make sure you include foods from all food groups to eat a balanced diet. Eat a variety of types of foods. Eat foods and drink beverages that do not cause you discomfort. These may include soups and broths with cooked meats, pasta, and vegetables. Lifestyle Sit up after meals, avoid tight clothing, and take time to eat and chew your food slowly. Ask your health care provider whether you should take dietary supplements. General information Mildly season your foods. Some seasonings, such as cayenne pepper, vinegar, or hot sauce, may cause irritation. The foods, beverages, or seasonings to avoid should be based on individual tolerance. What  foods should I eat? Fruits Canned or cooked fruit such as peaches, pears, or applesauce. Bananas. Vegetables Well-cooked vegetables. Canned or cooked vegetables such as carrots, green beans, beets, or spinach. Mashed or boiled potatoes. Grains  Hot cereals, such as cream of wheat and processed oatmeal. Rice. Bread, crackers, pasta, or tortillas made from refined white flour. Meats and other proteins  Eggs. Creamy peanut butter or other nut butters. Lean, well-cooked tender meats, such as beef, pork, chicken, or fish. Dairy Low-fat dairy products such as milk, cottage cheese, or yogurt. Beverages  Water. Herbal tea. Apple juice. Fats and oils Mild salad dressings. Canola or olive oil. Sweets and desserts Low-fat pudding, custard, or ice cream. Fruit gelatin. The items listed above may not be a complete list of foods and beverages you can eat. Contact a dietitian for more information. What foods should I avoid? Fruits Citrus fruits, such as oranges and grapefruit. Fruits with a stringy texture. Fruits that have lots of seeds, such as kiwi or strawberries. Dried fruits. Vegetables Raw, uncooked vegetables. Salads. Grains Whole grain breads, muffins, and cereals. Meats and other proteins Tough, fibrous meats. Highly seasoned meat such as corned beef, smoked meats, or fish. Processed high-fat meats such as brats, hot dogs, or sausage. Dairy Full-fat dairy foods such as ice cream and cheese. Beverages Caffeinated drinks. Alcohol. Seasonings and condiments Strongly flavored seasonings or condiments. Hot sauce. Salsa. Other foods Spicy foods. Fried or greasy foods. Sour foods, such as pickled or fermented foods like sauerkraut. Foods high in fiber. The items listed above may not be a complete list of foods and beverages you should  avoid. Contact a dietitian for more information. Summary A bland diet should be based on individual tolerance. It may consist of foods that are soft  textured and do not have a lot of fat, fiber, acid, or seasonings. A bland diet may be recommended because avoiding certain foods, beverages, or spices may make you feel better. This information is not intended to replace advice given to you by your health care provider. Make sure you discuss any questions you have with your health care provider. Document Revised: 05/30/2021 Document Reviewed: 05/30/2021 Elsevier Patient Education  2024 [employer].

## 2023-07-20 LAB — CBC WITH DIFFERENTIAL/PLATELET
Absolute Lymphocytes: 1433 {cells}/uL (ref 850–3900)
Absolute Monocytes: 645 {cells}/uL (ref 200–950)
Basophils Absolute: 8 {cells}/uL (ref 0–200)
Basophils Relative: 0.1 %
Eosinophils Absolute: 60 {cells}/uL (ref 15–500)
Eosinophils Relative: 0.8 %
HCT: 43.8 % (ref 38.5–50.0)
Hemoglobin: 14.8 g/dL (ref 13.2–17.1)
MCH: 30.4 pg (ref 27.0–33.0)
MCHC: 33.8 g/dL (ref 32.0–36.0)
MCV: 89.9 fL (ref 80.0–100.0)
MPV: 10.5 fL (ref 7.5–12.5)
Monocytes Relative: 8.6 %
Neutro Abs: 5355 {cells}/uL (ref 1500–7800)
Neutrophils Relative %: 71.4 %
Platelets: 203 10*3/uL (ref 140–400)
RBC: 4.87 10*6/uL (ref 4.20–5.80)
RDW: 12 % (ref 11.0–15.0)
Total Lymphocyte: 19.1 %
WBC: 7.5 10*3/uL (ref 3.8–10.8)

## 2023-07-20 LAB — COMPLETE METABOLIC PANEL WITH GFR
AG Ratio: 2 (calc) (ref 1.0–2.5)
ALT: 15 U/L (ref 9–46)
AST: 22 U/L (ref 10–35)
Albumin: 4.6 g/dL (ref 3.6–5.1)
Alkaline phosphatase (APISO): 65 U/L (ref 35–144)
BUN: 22 mg/dL (ref 7–25)
CO2: 28 mmol/L (ref 20–32)
Calcium: 9.7 mg/dL (ref 8.6–10.3)
Chloride: 98 mmol/L (ref 98–110)
Creat: 0.77 mg/dL (ref 0.70–1.28)
Globulin: 2.3 g/dL (ref 1.9–3.7)
Glucose, Bld: 92 mg/dL (ref 65–99)
Potassium: 4 mmol/L (ref 3.5–5.3)
Sodium: 137 mmol/L (ref 135–146)
Total Bilirubin: 0.9 mg/dL (ref 0.2–1.2)
Total Protein: 6.9 g/dL (ref 6.1–8.1)
eGFR: 96 mL/min/{1.73_m2} (ref >=60)

## 2023-07-23 DIAGNOSIS — F411 Generalized anxiety disorder: Secondary | ICD-10-CM | POA: Diagnosis not present

## 2023-07-23 DIAGNOSIS — F332 Major depressive disorder, recurrent severe without psychotic features: Secondary | ICD-10-CM | POA: Diagnosis not present

## 2023-07-26 ENCOUNTER — Telehealth: Payer: Self-pay | Admitting: Gastroenterology

## 2023-07-26 NOTE — Telephone Encounter (Signed)
 Good Afternoon Dr San   Supervising MD PM   We received a referral for patient to be seen for diarrhea.   Patient last seen with Dr Saintclair in 2023. Records are available in epic. Please review and advise on scheduling.  Requesting transfer due to pcp referring him.

## 2023-07-28 ENCOUNTER — Inpatient Hospital Stay (HOSPITAL_COMMUNITY)
Admission: EM | Admit: 2023-07-28 | Discharge: 2023-08-25 | DRG: 853 | Disposition: A | Discharge status: Skilled Nursing Facility | Payer: Medicare Other | Attending: Student | Admitting: Student

## 2023-07-28 ENCOUNTER — Other Ambulatory Visit: Payer: Self-pay

## 2023-07-28 ENCOUNTER — Emergency Department (HOSPITAL_COMMUNITY): Payer: Medicare Other

## 2023-07-28 ENCOUNTER — Encounter (HOSPITAL_COMMUNITY): Payer: Self-pay

## 2023-07-28 DIAGNOSIS — K839 Disease of biliary tract, unspecified: Secondary | ICD-10-CM | POA: Diagnosis not present

## 2023-07-28 DIAGNOSIS — K8020 Calculus of gallbladder without cholecystitis without obstruction: Secondary | ICD-10-CM | POA: Diagnosis not present

## 2023-07-28 DIAGNOSIS — I4821 Permanent atrial fibrillation: Secondary | ICD-10-CM | POA: Diagnosis not present

## 2023-07-28 DIAGNOSIS — K8000 Calculus of gallbladder with acute cholecystitis without obstruction: Secondary | ICD-10-CM | POA: Diagnosis present

## 2023-07-28 DIAGNOSIS — Z9884 Bariatric surgery status: Secondary | ICD-10-CM

## 2023-07-28 DIAGNOSIS — Z87442 Personal history of urinary calculi: Secondary | ICD-10-CM

## 2023-07-28 DIAGNOSIS — I4891 Unspecified atrial fibrillation: Secondary | ICD-10-CM | POA: Diagnosis not present

## 2023-07-28 DIAGNOSIS — R6521 Severe sepsis with septic shock: Secondary | ICD-10-CM | POA: Diagnosis not present

## 2023-07-28 DIAGNOSIS — Z1152 Encounter for screening for COVID-19: Secondary | ICD-10-CM | POA: Diagnosis not present

## 2023-07-28 DIAGNOSIS — I700 Atherosclerosis of aorta: Secondary | ICD-10-CM | POA: Diagnosis not present

## 2023-07-28 DIAGNOSIS — I480 Paroxysmal atrial fibrillation: Secondary | ICD-10-CM | POA: Diagnosis present

## 2023-07-28 DIAGNOSIS — E7800 Pure hypercholesterolemia, unspecified: Secondary | ICD-10-CM | POA: Diagnosis present

## 2023-07-28 DIAGNOSIS — Z1611 Resistance to penicillins: Secondary | ICD-10-CM | POA: Diagnosis present

## 2023-07-28 DIAGNOSIS — Z9049 Acquired absence of other specified parts of digestive tract: Secondary | ICD-10-CM | POA: Diagnosis not present

## 2023-07-28 DIAGNOSIS — K819 Cholecystitis, unspecified: Secondary | ICD-10-CM | POA: Diagnosis not present

## 2023-07-28 DIAGNOSIS — Z452 Encounter for adjustment and management of vascular access device: Secondary | ICD-10-CM | POA: Diagnosis not present

## 2023-07-28 DIAGNOSIS — R0781 Pleurodynia: Secondary | ICD-10-CM | POA: Diagnosis not present

## 2023-07-28 DIAGNOSIS — K9189 Other postprocedural complications and disorders of digestive system: Secondary | ICD-10-CM | POA: Diagnosis not present

## 2023-07-28 DIAGNOSIS — T85628A Displacement of other specified internal prosthetic devices, implants and grafts, initial encounter: Secondary | ICD-10-CM | POA: Diagnosis not present

## 2023-07-28 DIAGNOSIS — K828 Other specified diseases of gallbladder: Secondary | ICD-10-CM | POA: Diagnosis not present

## 2023-07-28 DIAGNOSIS — G4733 Obstructive sleep apnea (adult) (pediatric): Secondary | ICD-10-CM | POA: Diagnosis not present

## 2023-07-28 DIAGNOSIS — E7849 Other hyperlipidemia: Secondary | ICD-10-CM | POA: Diagnosis not present

## 2023-07-28 DIAGNOSIS — E785 Hyperlipidemia, unspecified: Secondary | ICD-10-CM | POA: Diagnosis not present

## 2023-07-28 DIAGNOSIS — K759 Inflammatory liver disease, unspecified: Secondary | ICD-10-CM | POA: Diagnosis not present

## 2023-07-28 DIAGNOSIS — K82A2 Perforation of gallbladder in cholecystitis: Secondary | ICD-10-CM | POA: Diagnosis present

## 2023-07-28 DIAGNOSIS — A419 Sepsis, unspecified organism: Secondary | ICD-10-CM | POA: Diagnosis not present

## 2023-07-28 DIAGNOSIS — K5730 Diverticulosis of large intestine without perforation or abscess without bleeding: Secondary | ICD-10-CM | POA: Diagnosis not present

## 2023-07-28 DIAGNOSIS — R1084 Generalized abdominal pain: Secondary | ICD-10-CM | POA: Diagnosis not present

## 2023-07-28 DIAGNOSIS — R9389 Abnormal findings on diagnostic imaging of other specified body structures: Secondary | ICD-10-CM | POA: Diagnosis not present

## 2023-07-28 DIAGNOSIS — N179 Acute kidney failure, unspecified: Secondary | ICD-10-CM

## 2023-07-28 DIAGNOSIS — Z8711 Personal history of peptic ulcer disease: Secondary | ICD-10-CM

## 2023-07-28 DIAGNOSIS — K915 Postcholecystectomy syndrome: Secondary | ICD-10-CM | POA: Diagnosis not present

## 2023-07-28 DIAGNOSIS — N400 Enlarged prostate without lower urinary tract symptoms: Secondary | ICD-10-CM | POA: Diagnosis present

## 2023-07-28 DIAGNOSIS — M1711 Unilateral primary osteoarthritis, right knee: Secondary | ICD-10-CM | POA: Diagnosis present

## 2023-07-28 DIAGNOSIS — E8809 Other disorders of plasma-protein metabolism, not elsewhere classified: Secondary | ICD-10-CM | POA: Diagnosis present

## 2023-07-28 DIAGNOSIS — K219 Gastro-esophageal reflux disease without esophagitis: Secondary | ICD-10-CM | POA: Diagnosis present

## 2023-07-28 DIAGNOSIS — E876 Hypokalemia: Secondary | ICD-10-CM | POA: Diagnosis not present

## 2023-07-28 DIAGNOSIS — F900 Attention-deficit hyperactivity disorder, predominantly inattentive type: Secondary | ICD-10-CM | POA: Diagnosis not present

## 2023-07-28 DIAGNOSIS — G4700 Insomnia, unspecified: Secondary | ICD-10-CM | POA: Diagnosis present

## 2023-07-28 DIAGNOSIS — I350 Nonrheumatic aortic (valve) stenosis: Secondary | ICD-10-CM | POA: Diagnosis not present

## 2023-07-28 DIAGNOSIS — Z79899 Other long term (current) drug therapy: Secondary | ICD-10-CM

## 2023-07-28 DIAGNOSIS — F32A Depression, unspecified: Secondary | ICD-10-CM | POA: Diagnosis present

## 2023-07-28 DIAGNOSIS — Z96652 Presence of left artificial knee joint: Secondary | ICD-10-CM | POA: Diagnosis present

## 2023-07-28 DIAGNOSIS — E43 Unspecified severe protein-calorie malnutrition: Secondary | ICD-10-CM | POA: Diagnosis not present

## 2023-07-28 DIAGNOSIS — F411 Generalized anxiety disorder: Secondary | ICD-10-CM | POA: Diagnosis not present

## 2023-07-28 DIAGNOSIS — N281 Cyst of kidney, acquired: Secondary | ICD-10-CM | POA: Diagnosis not present

## 2023-07-28 DIAGNOSIS — K651 Peritoneal abscess: Secondary | ICD-10-CM | POA: Diagnosis not present

## 2023-07-28 DIAGNOSIS — Y838 Other surgical procedures as the cause of abnormal reaction of the patient, or of later complication, without mention of misadventure at the time of the procedure: Secondary | ICD-10-CM | POA: Diagnosis not present

## 2023-07-28 DIAGNOSIS — E871 Hypo-osmolality and hyponatremia: Secondary | ICD-10-CM | POA: Diagnosis not present

## 2023-07-28 DIAGNOSIS — I739 Peripheral vascular disease, unspecified: Secondary | ICD-10-CM | POA: Diagnosis present

## 2023-07-28 DIAGNOSIS — E782 Mixed hyperlipidemia: Secondary | ICD-10-CM | POA: Diagnosis not present

## 2023-07-28 DIAGNOSIS — B9620 Unspecified Escherichia coli [E. coli] as the cause of diseases classified elsewhere: Secondary | ICD-10-CM | POA: Diagnosis not present

## 2023-07-28 DIAGNOSIS — R0600 Dyspnea, unspecified: Secondary | ICD-10-CM | POA: Diagnosis not present

## 2023-07-28 DIAGNOSIS — Z886 Allergy status to analgesic agent status: Secondary | ICD-10-CM

## 2023-07-28 DIAGNOSIS — N3289 Other specified disorders of bladder: Secondary | ICD-10-CM | POA: Diagnosis not present

## 2023-07-28 DIAGNOSIS — R0989 Other specified symptoms and signs involving the circulatory and respiratory systems: Secondary | ICD-10-CM | POA: Diagnosis not present

## 2023-07-28 DIAGNOSIS — I959 Hypotension, unspecified: Secondary | ICD-10-CM | POA: Diagnosis not present

## 2023-07-28 DIAGNOSIS — Z48815 Encounter for surgical aftercare following surgery on the digestive system: Secondary | ICD-10-CM | POA: Diagnosis not present

## 2023-07-28 DIAGNOSIS — Z6823 Body mass index (BMI) 23.0-23.9, adult: Secondary | ICD-10-CM

## 2023-07-28 DIAGNOSIS — Z634 Disappearance and death of family member: Secondary | ICD-10-CM

## 2023-07-28 DIAGNOSIS — D62 Acute posthemorrhagic anemia: Secondary | ICD-10-CM | POA: Diagnosis not present

## 2023-07-28 DIAGNOSIS — K810 Acute cholecystitis: Secondary | ICD-10-CM | POA: Diagnosis not present

## 2023-07-28 DIAGNOSIS — E8770 Fluid overload, unspecified: Secondary | ICD-10-CM | POA: Diagnosis not present

## 2023-07-28 DIAGNOSIS — I10 Essential (primary) hypertension: Secondary | ICD-10-CM | POA: Diagnosis not present

## 2023-07-28 DIAGNOSIS — R1011 Right upper quadrant pain: Secondary | ICD-10-CM | POA: Diagnosis not present

## 2023-07-28 DIAGNOSIS — R55 Syncope and collapse: Secondary | ICD-10-CM | POA: Diagnosis not present

## 2023-07-28 DIAGNOSIS — R531 Weakness: Secondary | ICD-10-CM | POA: Diagnosis not present

## 2023-07-28 DIAGNOSIS — N9989 Other postprocedural complications and disorders of genitourinary system: Secondary | ICD-10-CM | POA: Diagnosis not present

## 2023-07-28 DIAGNOSIS — Z8249 Family history of ischemic heart disease and other diseases of the circulatory system: Secondary | ICD-10-CM

## 2023-07-28 DIAGNOSIS — L89312 Pressure ulcer of right buttock, stage 2: Secondary | ICD-10-CM | POA: Diagnosis not present

## 2023-07-28 DIAGNOSIS — R918 Other nonspecific abnormal finding of lung field: Secondary | ICD-10-CM

## 2023-07-28 DIAGNOSIS — I2510 Atherosclerotic heart disease of native coronary artery without angina pectoris: Secondary | ICD-10-CM | POA: Diagnosis present

## 2023-07-28 DIAGNOSIS — K82A1 Gangrene of gallbladder in cholecystitis: Secondary | ICD-10-CM | POA: Diagnosis present

## 2023-07-28 DIAGNOSIS — I4892 Unspecified atrial flutter: Secondary | ICD-10-CM | POA: Diagnosis not present

## 2023-07-28 DIAGNOSIS — N200 Calculus of kidney: Secondary | ICD-10-CM | POA: Diagnosis not present

## 2023-07-28 DIAGNOSIS — I9589 Other hypotension: Secondary | ICD-10-CM

## 2023-07-28 DIAGNOSIS — R413 Other amnesia: Secondary | ICD-10-CM | POA: Diagnosis not present

## 2023-07-28 DIAGNOSIS — N132 Hydronephrosis with renal and ureteral calculous obstruction: Secondary | ICD-10-CM | POA: Diagnosis not present

## 2023-07-28 DIAGNOSIS — R1030 Lower abdominal pain, unspecified: Secondary | ICD-10-CM | POA: Diagnosis not present

## 2023-07-28 DIAGNOSIS — Z885 Allergy status to narcotic agent status: Secondary | ICD-10-CM

## 2023-07-28 DIAGNOSIS — K8689 Other specified diseases of pancreas: Secondary | ICD-10-CM | POA: Diagnosis not present

## 2023-07-28 DIAGNOSIS — J45909 Unspecified asthma, uncomplicated: Secondary | ICD-10-CM | POA: Diagnosis not present

## 2023-07-28 DIAGNOSIS — G629 Polyneuropathy, unspecified: Secondary | ICD-10-CM | POA: Diagnosis present

## 2023-07-28 DIAGNOSIS — R338 Other retention of urine: Secondary | ICD-10-CM | POA: Diagnosis not present

## 2023-07-28 DIAGNOSIS — Z7901 Long term (current) use of anticoagulants: Secondary | ICD-10-CM

## 2023-07-28 DIAGNOSIS — R6510 Systemic inflammatory response syndrome (SIRS) of non-infectious origin without acute organ dysfunction: Secondary | ICD-10-CM | POA: Diagnosis not present

## 2023-07-28 DIAGNOSIS — E861 Hypovolemia: Secondary | ICD-10-CM | POA: Diagnosis not present

## 2023-07-28 DIAGNOSIS — Z7401 Bed confinement status: Secondary | ICD-10-CM | POA: Diagnosis not present

## 2023-07-28 DIAGNOSIS — M19011 Primary osteoarthritis, right shoulder: Secondary | ICD-10-CM | POA: Diagnosis not present

## 2023-07-28 DIAGNOSIS — I951 Orthostatic hypotension: Secondary | ICD-10-CM | POA: Diagnosis present

## 2023-07-28 DIAGNOSIS — M6281 Muscle weakness (generalized): Secondary | ICD-10-CM | POA: Diagnosis not present

## 2023-07-28 DIAGNOSIS — Z7952 Long term (current) use of systemic steroids: Secondary | ICD-10-CM

## 2023-07-28 DIAGNOSIS — F5101 Primary insomnia: Secondary | ICD-10-CM | POA: Diagnosis not present

## 2023-07-28 DIAGNOSIS — M25511 Pain in right shoulder: Secondary | ICD-10-CM | POA: Diagnosis not present

## 2023-07-28 DIAGNOSIS — Z8719 Personal history of other diseases of the digestive system: Secondary | ICD-10-CM

## 2023-07-28 DIAGNOSIS — K5900 Constipation, unspecified: Secondary | ICD-10-CM | POA: Diagnosis not present

## 2023-07-28 DIAGNOSIS — R2681 Unsteadiness on feet: Secondary | ICD-10-CM | POA: Diagnosis not present

## 2023-07-28 DIAGNOSIS — Z01810 Encounter for preprocedural cardiovascular examination: Secondary | ICD-10-CM | POA: Diagnosis not present

## 2023-07-28 DIAGNOSIS — J9811 Atelectasis: Secondary | ICD-10-CM | POA: Diagnosis not present

## 2023-07-28 DIAGNOSIS — R9431 Abnormal electrocardiogram [ECG] [EKG]: Secondary | ICD-10-CM | POA: Diagnosis not present

## 2023-07-28 DIAGNOSIS — T85520A Displacement of bile duct prosthesis, initial encounter: Secondary | ICD-10-CM | POA: Diagnosis not present

## 2023-07-28 DIAGNOSIS — I4811 Longstanding persistent atrial fibrillation: Secondary | ICD-10-CM | POA: Diagnosis not present

## 2023-07-28 HISTORY — DX: Other hypotension: I95.89

## 2023-07-28 LAB — BASIC METABOLIC PANEL
Anion gap: 10 (ref 5–15)
BUN: 68 mg/dL — ABNORMAL HIGH (ref 8–23)
CO2: 27 mmol/L (ref 22–32)
Calcium: 10 mg/dL (ref 8.9–10.3)
Chloride: 100 mmol/L (ref 98–111)
Creatinine, Ser: 1.51 mg/dL — ABNORMAL HIGH (ref 0.61–1.24)
GFR, Estimated: 49 mL/min — ABNORMAL LOW (ref >60)
Glucose, Bld: 151 mg/dL — ABNORMAL HIGH (ref 70–99)
Potassium: 4.1 mmol/L (ref 3.5–5.1)
Sodium: 137 mmol/L (ref 135–145)

## 2023-07-28 LAB — CBC
HCT: 41 % (ref 39.0–52.0)
Hemoglobin: 13.8 g/dL (ref 13.0–17.0)
MCH: 31 pg (ref 26.0–34.0)
MCHC: 33.7 g/dL (ref 30.0–36.0)
MCV: 92.1 fL (ref 80.0–100.0)
Platelets: 194 10*3/uL (ref 150–400)
RBC: 4.45 MIL/uL (ref 4.22–5.81)
RDW: 13.1 % (ref 11.5–15.5)
WBC: 23.8 10*3/uL — ABNORMAL HIGH (ref 4.0–10.5)
nRBC: 0 % (ref 0.0–0.2)

## 2023-07-28 LAB — CBG MONITORING, ED: Glucose-Capillary: 138 mg/dL — ABNORMAL HIGH (ref 70–99)

## 2023-07-28 MED ORDER — SODIUM CHLORIDE 0.9 % IV BOLUS
1000.0000 mL | Freq: Once | INTRAVENOUS | Status: AC
  Administered 2023-07-29: 1000 mL via INTRAVENOUS

## 2023-07-28 NOTE — ED Triage Notes (Signed)
 Pt BIB [facility] for generalized weakness/fatigue x 1 month. Pt states he thinks that he has IBS, although he does not have a "formal diagnosis" and has been eating the BRAT diet x 1 month.

## 2023-07-29 ENCOUNTER — Encounter (HOSPITAL_COMMUNITY): Payer: Self-pay | Admitting: Internal Medicine

## 2023-07-29 ENCOUNTER — Inpatient Hospital Stay (HOSPITAL_COMMUNITY): Payer: Self-pay | Admitting: Anesthesiology

## 2023-07-29 DIAGNOSIS — N281 Cyst of kidney, acquired: Secondary | ICD-10-CM | POA: Diagnosis not present

## 2023-07-29 DIAGNOSIS — N200 Calculus of kidney: Secondary | ICD-10-CM | POA: Diagnosis not present

## 2023-07-29 DIAGNOSIS — Z7401 Bed confinement status: Secondary | ICD-10-CM | POA: Diagnosis not present

## 2023-07-29 DIAGNOSIS — E7849 Other hyperlipidemia: Secondary | ICD-10-CM | POA: Diagnosis not present

## 2023-07-29 DIAGNOSIS — T85628A Displacement of other specified internal prosthetic devices, implants and grafts, initial encounter: Secondary | ICD-10-CM | POA: Diagnosis not present

## 2023-07-29 DIAGNOSIS — R6510 Systemic inflammatory response syndrome (SIRS) of non-infectious origin without acute organ dysfunction: Secondary | ICD-10-CM | POA: Diagnosis not present

## 2023-07-29 DIAGNOSIS — K9189 Other postprocedural complications and disorders of digestive system: Secondary | ICD-10-CM | POA: Diagnosis not present

## 2023-07-29 DIAGNOSIS — K819 Cholecystitis, unspecified: Secondary | ICD-10-CM | POA: Diagnosis not present

## 2023-07-29 DIAGNOSIS — K8689 Other specified diseases of pancreas: Secondary | ICD-10-CM | POA: Diagnosis not present

## 2023-07-29 DIAGNOSIS — A419 Sepsis, unspecified organism: Secondary | ICD-10-CM | POA: Insufficient documentation

## 2023-07-29 DIAGNOSIS — M19011 Primary osteoarthritis, right shoulder: Secondary | ICD-10-CM | POA: Diagnosis not present

## 2023-07-29 DIAGNOSIS — I2510 Atherosclerotic heart disease of native coronary artery without angina pectoris: Secondary | ICD-10-CM | POA: Diagnosis not present

## 2023-07-29 DIAGNOSIS — Y838 Other surgical procedures as the cause of abnormal reaction of the patient, or of later complication, without mention of misadventure at the time of the procedure: Secondary | ICD-10-CM | POA: Diagnosis not present

## 2023-07-29 DIAGNOSIS — R0989 Other specified symptoms and signs involving the circulatory and respiratory systems: Secondary | ICD-10-CM | POA: Diagnosis not present

## 2023-07-29 DIAGNOSIS — K915 Postcholecystectomy syndrome: Secondary | ICD-10-CM | POA: Diagnosis not present

## 2023-07-29 DIAGNOSIS — R55 Syncope and collapse: Secondary | ICD-10-CM | POA: Diagnosis not present

## 2023-07-29 DIAGNOSIS — K759 Inflammatory liver disease, unspecified: Secondary | ICD-10-CM | POA: Diagnosis not present

## 2023-07-29 DIAGNOSIS — I951 Orthostatic hypotension: Secondary | ICD-10-CM

## 2023-07-29 DIAGNOSIS — E785 Hyperlipidemia, unspecified: Secondary | ICD-10-CM | POA: Diagnosis not present

## 2023-07-29 DIAGNOSIS — K810 Acute cholecystitis: Secondary | ICD-10-CM | POA: Diagnosis not present

## 2023-07-29 DIAGNOSIS — R2681 Unsteadiness on feet: Secondary | ICD-10-CM | POA: Diagnosis not present

## 2023-07-29 DIAGNOSIS — N132 Hydronephrosis with renal and ureteral calculous obstruction: Secondary | ICD-10-CM | POA: Diagnosis not present

## 2023-07-29 DIAGNOSIS — R1030 Lower abdominal pain, unspecified: Secondary | ICD-10-CM | POA: Diagnosis not present

## 2023-07-29 DIAGNOSIS — E782 Mixed hyperlipidemia: Secondary | ICD-10-CM | POA: Diagnosis not present

## 2023-07-29 DIAGNOSIS — K8000 Calculus of gallbladder with acute cholecystitis without obstruction: Secondary | ICD-10-CM | POA: Diagnosis present

## 2023-07-29 DIAGNOSIS — K8020 Calculus of gallbladder without cholecystitis without obstruction: Secondary | ICD-10-CM | POA: Diagnosis not present

## 2023-07-29 DIAGNOSIS — I350 Nonrheumatic aortic (valve) stenosis: Secondary | ICD-10-CM | POA: Diagnosis present

## 2023-07-29 DIAGNOSIS — T85520A Displacement of bile duct prosthesis, initial encounter: Secondary | ICD-10-CM | POA: Diagnosis not present

## 2023-07-29 DIAGNOSIS — M6281 Muscle weakness (generalized): Secondary | ICD-10-CM | POA: Diagnosis not present

## 2023-07-29 DIAGNOSIS — K839 Disease of biliary tract, unspecified: Secondary | ICD-10-CM | POA: Diagnosis not present

## 2023-07-29 DIAGNOSIS — I4891 Unspecified atrial fibrillation: Secondary | ICD-10-CM | POA: Diagnosis not present

## 2023-07-29 DIAGNOSIS — Z1152 Encounter for screening for COVID-19: Secondary | ICD-10-CM | POA: Diagnosis not present

## 2023-07-29 DIAGNOSIS — L89312 Pressure ulcer of right buttock, stage 2: Secondary | ICD-10-CM | POA: Diagnosis not present

## 2023-07-29 DIAGNOSIS — G4733 Obstructive sleep apnea (adult) (pediatric): Secondary | ICD-10-CM

## 2023-07-29 DIAGNOSIS — G629 Polyneuropathy, unspecified: Secondary | ICD-10-CM | POA: Diagnosis present

## 2023-07-29 DIAGNOSIS — J9811 Atelectasis: Secondary | ICD-10-CM | POA: Diagnosis not present

## 2023-07-29 DIAGNOSIS — I480 Paroxysmal atrial fibrillation: Secondary | ICD-10-CM

## 2023-07-29 DIAGNOSIS — Z452 Encounter for adjustment and management of vascular access device: Secondary | ICD-10-CM | POA: Diagnosis not present

## 2023-07-29 DIAGNOSIS — I700 Atherosclerosis of aorta: Secondary | ICD-10-CM | POA: Diagnosis not present

## 2023-07-29 DIAGNOSIS — F411 Generalized anxiety disorder: Secondary | ICD-10-CM | POA: Insufficient documentation

## 2023-07-29 DIAGNOSIS — I10 Essential (primary) hypertension: Secondary | ICD-10-CM | POA: Diagnosis present

## 2023-07-29 DIAGNOSIS — I4811 Longstanding persistent atrial fibrillation: Secondary | ICD-10-CM | POA: Diagnosis not present

## 2023-07-29 DIAGNOSIS — N179 Acute kidney failure, unspecified: Secondary | ICD-10-CM | POA: Diagnosis present

## 2023-07-29 DIAGNOSIS — E871 Hypo-osmolality and hyponatremia: Secondary | ICD-10-CM | POA: Diagnosis not present

## 2023-07-29 DIAGNOSIS — N3289 Other specified disorders of bladder: Secondary | ICD-10-CM | POA: Diagnosis not present

## 2023-07-29 DIAGNOSIS — J45909 Unspecified asthma, uncomplicated: Secondary | ICD-10-CM | POA: Diagnosis not present

## 2023-07-29 DIAGNOSIS — R531 Weakness: Secondary | ICD-10-CM | POA: Diagnosis present

## 2023-07-29 DIAGNOSIS — R1011 Right upper quadrant pain: Secondary | ICD-10-CM | POA: Diagnosis not present

## 2023-07-29 DIAGNOSIS — K651 Peritoneal abscess: Secondary | ICD-10-CM | POA: Diagnosis not present

## 2023-07-29 DIAGNOSIS — K82A1 Gangrene of gallbladder in cholecystitis: Secondary | ICD-10-CM | POA: Diagnosis present

## 2023-07-29 DIAGNOSIS — Z7901 Long term (current) use of anticoagulants: Secondary | ICD-10-CM | POA: Diagnosis not present

## 2023-07-29 DIAGNOSIS — E7800 Pure hypercholesterolemia, unspecified: Secondary | ICD-10-CM | POA: Diagnosis present

## 2023-07-29 DIAGNOSIS — Z9049 Acquired absence of other specified parts of digestive tract: Secondary | ICD-10-CM | POA: Diagnosis not present

## 2023-07-29 DIAGNOSIS — F32A Depression, unspecified: Secondary | ICD-10-CM | POA: Diagnosis present

## 2023-07-29 DIAGNOSIS — E43 Unspecified severe protein-calorie malnutrition: Secondary | ICD-10-CM | POA: Diagnosis not present

## 2023-07-29 DIAGNOSIS — R6521 Severe sepsis with septic shock: Secondary | ICD-10-CM | POA: Diagnosis not present

## 2023-07-29 DIAGNOSIS — Z48815 Encounter for surgical aftercare following surgery on the digestive system: Secondary | ICD-10-CM | POA: Diagnosis not present

## 2023-07-29 DIAGNOSIS — Z01810 Encounter for preprocedural cardiovascular examination: Secondary | ICD-10-CM | POA: Diagnosis not present

## 2023-07-29 DIAGNOSIS — F5101 Primary insomnia: Secondary | ICD-10-CM | POA: Diagnosis not present

## 2023-07-29 DIAGNOSIS — Z1611 Resistance to penicillins: Secondary | ICD-10-CM | POA: Diagnosis present

## 2023-07-29 DIAGNOSIS — K828 Other specified diseases of gallbladder: Secondary | ICD-10-CM | POA: Diagnosis not present

## 2023-07-29 DIAGNOSIS — F900 Attention-deficit hyperactivity disorder, predominantly inattentive type: Secondary | ICD-10-CM | POA: Diagnosis not present

## 2023-07-29 DIAGNOSIS — R413 Other amnesia: Secondary | ICD-10-CM | POA: Diagnosis not present

## 2023-07-29 DIAGNOSIS — I4892 Unspecified atrial flutter: Secondary | ICD-10-CM | POA: Diagnosis not present

## 2023-07-29 DIAGNOSIS — I959 Hypotension, unspecified: Secondary | ICD-10-CM | POA: Diagnosis not present

## 2023-07-29 DIAGNOSIS — M25511 Pain in right shoulder: Secondary | ICD-10-CM | POA: Diagnosis not present

## 2023-07-29 DIAGNOSIS — I739 Peripheral vascular disease, unspecified: Secondary | ICD-10-CM | POA: Diagnosis present

## 2023-07-29 DIAGNOSIS — K5730 Diverticulosis of large intestine without perforation or abscess without bleeding: Secondary | ICD-10-CM | POA: Diagnosis not present

## 2023-07-29 DIAGNOSIS — I4821 Permanent atrial fibrillation: Secondary | ICD-10-CM | POA: Diagnosis present

## 2023-07-29 DIAGNOSIS — E861 Hypovolemia: Secondary | ICD-10-CM | POA: Diagnosis not present

## 2023-07-29 DIAGNOSIS — D62 Acute posthemorrhagic anemia: Secondary | ICD-10-CM | POA: Diagnosis not present

## 2023-07-29 DIAGNOSIS — E8809 Other disorders of plasma-protein metabolism, not elsewhere classified: Secondary | ICD-10-CM | POA: Diagnosis present

## 2023-07-29 DIAGNOSIS — R0600 Dyspnea, unspecified: Secondary | ICD-10-CM | POA: Diagnosis not present

## 2023-07-29 DIAGNOSIS — Z9884 Bariatric surgery status: Secondary | ICD-10-CM | POA: Diagnosis not present

## 2023-07-29 DIAGNOSIS — K82A2 Perforation of gallbladder in cholecystitis: Secondary | ICD-10-CM | POA: Diagnosis present

## 2023-07-29 DIAGNOSIS — I9589 Other hypotension: Secondary | ICD-10-CM | POA: Diagnosis not present

## 2023-07-29 DIAGNOSIS — R918 Other nonspecific abnormal finding of lung field: Secondary | ICD-10-CM | POA: Diagnosis not present

## 2023-07-29 DIAGNOSIS — R9389 Abnormal findings on diagnostic imaging of other specified body structures: Secondary | ICD-10-CM | POA: Diagnosis not present

## 2023-07-29 LAB — HEPATIC FUNCTION PANEL
ALT: 17 U/L (ref 0–44)
AST: 22 U/L (ref 15–41)
Albumin: 3.2 g/dL — ABNORMAL LOW (ref 3.5–5.0)
Alkaline Phosphatase: 63 U/L (ref 38–126)
Bilirubin, Direct: 0.3 mg/dL — ABNORMAL HIGH (ref 0.0–0.2)
Indirect Bilirubin: 0.6 mg/dL (ref 0.3–0.9)
Total Bilirubin: 0.9 mg/dL (ref 0.0–1.2)
Total Protein: 6.7 g/dL (ref 6.5–8.1)

## 2023-07-29 LAB — LIPASE, BLOOD: Lipase: 29 U/L (ref 11–51)

## 2023-07-29 LAB — COMPREHENSIVE METABOLIC PANEL
ALT: 15 U/L (ref 0–44)
AST: 19 U/L (ref 15–41)
Albumin: 2.9 g/dL — ABNORMAL LOW (ref 3.5–5.0)
Alkaline Phosphatase: 58 U/L (ref 38–126)
Anion gap: 11 (ref 5–15)
BUN: 56 mg/dL — ABNORMAL HIGH (ref 8–23)
CO2: 23 mmol/L (ref 22–32)
Calcium: 9.3 mg/dL (ref 8.9–10.3)
Chloride: 102 mmol/L (ref 98–111)
Creatinine, Ser: 1.1 mg/dL (ref 0.61–1.24)
GFR, Estimated: >60 mL/min (ref >60)
Glucose, Bld: 116 mg/dL — ABNORMAL HIGH (ref 70–99)
Potassium: 3.8 mmol/L (ref 3.5–5.1)
Sodium: 136 mmol/L (ref 135–145)
Total Bilirubin: 0.8 mg/dL (ref 0.0–1.2)
Total Protein: 5.9 g/dL — ABNORMAL LOW (ref 6.5–8.1)

## 2023-07-29 LAB — HEPATITIS PANEL, ACUTE
HCV Ab: NONREACTIVE
Hep A IgM: NONREACTIVE
Hep B C IgM: NONREACTIVE
Hepatitis B Surface Ag: NONREACTIVE

## 2023-07-29 LAB — CBC
HCT: 36.4 % — ABNORMAL LOW (ref 39.0–52.0)
Hemoglobin: 12.4 g/dL — ABNORMAL LOW (ref 13.0–17.0)
MCH: 31 pg (ref 26.0–34.0)
MCHC: 34.1 g/dL (ref 30.0–36.0)
MCV: 91 fL (ref 80.0–100.0)
Platelets: 187 10*3/uL (ref 150–400)
RBC: 4 MIL/uL — ABNORMAL LOW (ref 4.22–5.81)
RDW: 13.1 % (ref 11.5–15.5)
WBC: 20.6 10*3/uL — ABNORMAL HIGH (ref 4.0–10.5)
nRBC: 0 % (ref 0.0–0.2)

## 2023-07-29 LAB — RESP PANEL BY RT-PCR (RSV, FLU A&B, COVID)  RVPGX2
Influenza A by PCR: NEGATIVE
Influenza B by PCR: NEGATIVE
Resp Syncytial Virus by PCR: NEGATIVE
SARS Coronavirus 2 by RT PCR: NEGATIVE

## 2023-07-29 LAB — SODIUM, URINE, RANDOM: Sodium, Ur: <10 mmol/L

## 2023-07-29 LAB — I-STAT CG4 LACTIC ACID, ED: Lactic Acid, Venous: 1.4 mmol/L (ref 0.5–1.9)

## 2023-07-29 LAB — CREATININE, URINE, RANDOM: Creatinine, Urine: 105 mg/dL

## 2023-07-29 MED ORDER — FLUDROCORTISONE ACETATE 0.1 MG PO TABS
0.1000 mg | ORAL_TABLET | Freq: Every day | ORAL | Status: DC
  Administered 2023-07-29 – 2023-08-22 (×24): 0.1 mg via ORAL
  Filled 2023-07-29 (×27): qty 1

## 2023-07-29 MED ORDER — FLUDROCORTISONE ACETATE 0.1 MG PO TABS: 0.1000 mg | ORAL_TABLET | Freq: Every day | ORAL | Status: DC

## 2023-07-29 MED ORDER — PANTOPRAZOLE SODIUM 40 MG PO TBEC
40.0000 mg | DELAYED_RELEASE_TABLET | Freq: Every day | ORAL | Status: DC
  Administered 2023-07-29 – 2023-08-15 (×17): 40 mg via ORAL
  Filled 2023-07-29 (×17): qty 1

## 2023-07-29 MED ORDER — ZOLPIDEM TARTRATE 5 MG PO TABS
5.0000 mg | ORAL_TABLET | Freq: Every evening | ORAL | Status: DC | PRN
  Administered 2023-07-29 – 2023-08-12 (×6): 5 mg via ORAL
  Filled 2023-07-29 (×6): qty 1

## 2023-07-29 MED ORDER — SODIUM CHLORIDE 0.9 % IV SOLN: 250.0000 mL | INTRAVENOUS | Status: DC | PRN

## 2023-07-29 MED ORDER — PIPERACILLIN-TAZOBACTAM 3.375 G IVPB 30 MIN
3.3750 g | Freq: Once | INTRAVENOUS | Status: AC
Start: 2023-07-29 — End: 2023-07-29
  Administered 2023-07-29: 3.375 g via INTRAVENOUS
  Filled 2023-07-29: qty 50

## 2023-07-29 MED ORDER — ACETAMINOPHEN 325 MG PO TABS
650.0000 mg | ORAL_TABLET | Freq: Four times a day (QID) | ORAL | Status: DC | PRN
  Administered 2023-07-29 – 2023-08-02 (×4): 650 mg via ORAL
  Filled 2023-07-29 (×4): qty 2

## 2023-07-29 MED ORDER — PROSIGHT PO TABS
1.0000 | ORAL_TABLET | Freq: Every day | ORAL | Status: DC
  Administered 2023-07-29 – 2023-08-03 (×5): 1 via ORAL
  Filled 2023-07-29 (×6): qty 1

## 2023-07-29 MED ORDER — ACETAMINOPHEN 650 MG RE SUPP: 650.0000 mg | Freq: Four times a day (QID) | RECTAL | Status: DC | PRN

## 2023-07-29 MED ORDER — IOHEXOL 350 MG/ML SOLN
75.0000 mL | Freq: Once | INTRAVENOUS | Status: AC | PRN
Start: 2023-07-29 — End: 2023-07-28
  Administered 2023-07-28: 75 mL via INTRAVENOUS

## 2023-07-29 MED ORDER — OCUVITE-LUTEIN PO CAPS
1.0000 | ORAL_CAPSULE | Freq: Every day | ORAL | Status: DC
  Filled 2023-07-29: qty 1

## 2023-07-29 MED ORDER — ATORVASTATIN CALCIUM 10 MG PO TABS: 20.0000 mg | ORAL_TABLET | Freq: Every day | ORAL | Status: DC

## 2023-07-29 MED ORDER — VENLAFAXINE HCL ER 37.5 MG PO CP24
37.5000 mg | ORAL_CAPSULE | Freq: Every day | ORAL | Status: DC
  Administered 2023-07-30 – 2023-08-23 (×15): 37.5 mg via ORAL
  Filled 2023-07-29 (×32): qty 1

## 2023-07-29 MED ORDER — ALFUZOSIN HCL ER 10 MG PO TB24
10.0000 mg | ORAL_TABLET | Freq: Every day | ORAL | Status: DC
  Administered 2023-07-30 – 2023-08-21 (×19): 10 mg via ORAL
  Filled 2023-07-29 (×25): qty 1

## 2023-07-29 MED ORDER — SODIUM CHLORIDE 0.9% FLUSH: 3.0000 mL | INTRAVENOUS | Status: DC | PRN

## 2023-07-29 MED ORDER — SODIUM CHLORIDE 0.9% FLUSH
3.0000 mL | Freq: Two times a day (BID) | INTRAVENOUS | Status: DC
Start: 2023-07-29 — End: 2023-07-30
  Administered 2023-07-29 – 2023-07-30 (×2): 3 mL via INTRAVENOUS

## 2023-07-29 MED ORDER — ZOLPIDEM TARTRATE 5 MG PO TABS: 5.0000 mg | ORAL_TABLET | Freq: Every day | ORAL | Status: DC

## 2023-07-29 MED ORDER — GABAPENTIN 300 MG PO CAPS
600.0000 mg | ORAL_CAPSULE | Freq: Every day | ORAL | Status: DC
  Administered 2023-07-29 – 2023-08-24 (×27): 600 mg via ORAL
  Filled 2023-07-29 (×27): qty 2

## 2023-07-29 MED ORDER — SODIUM CHLORIDE 0.9% FLUSH
3.0000 mL | Freq: Two times a day (BID) | INTRAVENOUS | Status: DC
  Administered 2023-07-29 – 2023-08-25 (×48): 3 mL via INTRAVENOUS

## 2023-07-29 MED ORDER — RISAQUAD PO CAPS
1.0000 | ORAL_CAPSULE | Freq: Every day | ORAL | Status: DC
  Administered 2023-07-29 – 2023-08-25 (×27): 1 via ORAL
  Filled 2023-07-29 (×27): qty 1

## 2023-07-29 MED ORDER — LATANOPROST 0.005 % OP SOLN
1.0000 [drp] | Freq: Every day | OPHTHALMIC | Status: DC
  Administered 2023-07-29 – 2023-08-24 (×27): 1 [drp] via OPHTHALMIC
  Filled 2023-07-29 (×2): qty 2.5

## 2023-07-29 MED ORDER — PROSIGHT PO TABS
1.0000 | ORAL_TABLET | Freq: Every day | ORAL | Status: DC
Start: 2023-07-30 — End: 2023-07-29

## 2023-07-29 MED ORDER — PIPERACILLIN-TAZOBACTAM 3.375 G IVPB
3.3750 g | Freq: Three times a day (TID) | INTRAVENOUS | Status: AC
  Administered 2023-07-29 – 2023-08-06 (×25): 3.375 g via INTRAVENOUS
  Filled 2023-07-29 (×27): qty 50

## 2023-07-29 MED ORDER — SODIUM CHLORIDE 0.9 % IV SOLN: INTRAVENOUS | Status: DC

## 2023-07-29 MED ORDER — SENNOSIDES-DOCUSATE SODIUM 8.6-50 MG PO TABS
1.0000 | ORAL_TABLET | Freq: Every evening | ORAL | Status: DC | PRN
  Administered 2023-08-02 – 2023-08-03 (×2): 1 via ORAL
  Filled 2023-07-29 (×2): qty 1

## 2023-07-29 MED ORDER — LORAZEPAM 1 MG PO TABS
1.0000 mg | ORAL_TABLET | ORAL | Status: DC | PRN
  Administered 2023-07-29 – 2023-08-25 (×79): 1 mg via ORAL
  Filled 2023-07-29 (×14): qty 1
  Filled 2023-07-29: qty 2
  Filled 2023-07-29 (×36): qty 1
  Filled 2023-07-29: qty 2
  Filled 2023-07-29 (×29): qty 1

## 2023-07-29 MED ORDER — TRAMADOL HCL 50 MG PO TABS
50.0000 mg | ORAL_TABLET | Freq: Two times a day (BID) | ORAL | Status: DC | PRN
  Administered 2023-07-29 – 2023-07-31 (×4): 50 mg via ORAL
  Administered 2023-07-31 – 2023-08-01 (×2): 100 mg via ORAL
  Filled 2023-07-29 (×2): qty 1
  Filled 2023-07-29: qty 2
  Filled 2023-07-29 (×2): qty 1
  Filled 2023-07-29: qty 2

## 2023-07-29 NOTE — Progress Notes (Signed)
 Pharmacy Antibiotic Note  Francisco Padilla is a 73 y.o. male admitted on 07/28/2023 with generalized weakness/fatigue and CT abdomen showing concerns for acute cholecystitis.  Pharmacy has been consulted for Zosyn  dosing.  -WBC 23.8, afebrile, sCr 1.51 (bl~0.7-0.8) -Zosyn  x1 in ED -Blood cultures collected in ED  Plan: -Zosyn  3.375gm IV every 8 hours  -Monitor renal function -Follow up signs of clinical improvement, LOT, de-escalation of antibiotics -Follow up plans for cholecystectomy      Temp (24hrs), Avg:98.8 F (37.1 C), Min:98.4 F (36.9 C), Max:99.1 F (37.3 C)  Recent Labs  Lab 07/28/23 2135 07/29/23 0022  WBC 23.8*  --   CREATININE 1.51*  --   LATICACIDVEN  --  1.4    Estimated Creatinine Clearance: 55.1 mL/min (A) (by C-G formula based on SCr of 1.51 mg/dL (H)).    Allergies  Allergen Reactions   Aspirin     Avoid due to bariatric surgery   Bupropion Other (See Comments)    Passed out   Nsaids     Avoid due to bariatric surgery   Oxycodone  Hcl     Hallucinations, agitation     Antimicrobials this admission: Zosyn  1/5 >>   Microbiology results: 1/5 BCx:   Thank you for allowing pharmacy to be a part of this patient's care.  Lynwood Poplar, PharmD. Clinical Pharmacist 07/29/2023 2:06 AM

## 2023-07-29 NOTE — H&P (Addendum)
 History and Physical    Francisco Padilla FMW:[id number] DOB: [birth year] DOA: 07/28/2023  PCP: Sherlynn Madden, MD   Patient coming from: Home   Chief Complaint:  Chief Complaint  Patient presents with   Weakness   ED TRIAGE note:Pt BIB [facility] for generalized weakness/fatigue x 1 month. Pt states he thinks that he has IBS, although he does not have a formal diagnosis and has been eating the BRAT diet x 1 month.   HPI:  Francisco Padilla is a 73 y.o. male with medical history significant of OSA on CPAP, depression, anxiety, peripheral neuropathy, hyperlipidemia, chronic hypotension, paroxysmal atrial fibrillation, gerd, history of bariatric surgery, BPH and peripheral vascular disease presented to emergency department for complaining of worsening generalized weakness/fatigue which is ongoing for 1 month.  Patient reported that he has ongoing nausea, abdominal pain, poor appetite and diarrhea for last 1 month. During my ablation at the bedside patient reported that due to poor appetite and intermittent diarrhea patient lost around 10 to 12 pounds over the course of last 1 month.  Patient also reporting right upper quadrant abdominal pain which get progressively getting worse with deep breathing.  There is no association with food intake.  Currently right-sided upper quadrant abdominal pain 3/10 out of intensity.  Denies any fever and chill.  Denies any nausea and vomiting. No complaint at this time. Reported not compliant with Eliquis  and for last 3-4 days due to nausea and poor appetite, he took Eliquis  only once 1 time 3 days ago on 07/26/2023 morning. Of note patient reported that family history of purpura. Reported history of nasal bleeding after septal deviation surgery while on Eliquis  in the past.  ED Course:  At presentation to ED patient found tachycardic 102, hypotensive 93/73, otherwise hemodynamically stable. Pending UA, blood cultures, and respiratory panel, hepatitis panel. BMP  showed elevated creatinine 1.51, and BUN 68.  Otherwise unremarkable. CBC showing leukocytosis 23.8 Normal lipase 29. Lactic acid 1.4.  EKG showed atrial fibrillation heart rate 102.  CT abdomen pelvis: Cholelithiasis. Gallbladder distension with gallbladder wall thickening and surrounding inflammation concerning for acute cholecystitis.  Periportal edema can be seen with volume overload/resuscitation or inflammatory processes such as hepatitis.  Bilateral nephrolithiasis.  No hydronephrosis.  Prior gastric bypass.  No visible complicating feature.  Aortoiliac atherosclerosis  CT head no acute intracranial abnormality.  Showed atrophy and chronic microvascular disease.  CT chest no acute cardiopulmonary abnormality.  Three-vessel CAD and aortic atherosclerosis.  Based on the CT scan finding which is showing acute cholecystitis ED physician has been spoken with general surgery Dr.Kinsinger recommended keep patient NPO and plan for cholecystectomy in the daytime.  With the concern for acute cholecystitis in the ED patient has been started treating with Zosyn  and due to hypotension patient resuscitated 1 L of NS bolus.  Hospitalist has been contacted for further evaluation management of acute cholecystitis, concern for hepatitis and AKI.  Significant labs in the ED: Lab Orders         Blood culture (routine x 2)         Resp panel by RT-PCR (RSV, Flu A&B, Covid) Anterior Nasal Swab         Basic metabolic panel         CBC         Urinalysis, Routine w reflex microscopic -Urine, Clean Catch         Hepatic function panel         Lipase, blood  Hepatitis panel, acute         Comprehensive metabolic panel         CBC         Hepatic function panel         Creatinine, urine, random         Sodium, urine, random         CBG monitoring, ED         I-Stat CG4 Lactic Acid       Review of Systems:  Review of Systems  Constitutional:  Positive for malaise/fatigue and  weight loss. Negative for chills and fever.  Respiratory:  Negative for cough and shortness of breath.   Cardiovascular:  Negative for chest pain and palpitations.  Gastrointestinal:  Positive for abdominal pain, diarrhea and nausea. Negative for constipation, heartburn and vomiting.  Musculoskeletal:  Negative for myalgias.  Neurological:  Negative for dizziness and headaches.  Endo/Heme/Allergies:  Does not bruise/bleed easily.  Psychiatric/Behavioral:  The patient is not nervous/anxious.     Past Medical History:  Diagnosis Date   ADD (attention deficit disorder)    Anxiety 2007   Asthma as child   Asymptomatic gallstones    Atrial fibrillation (HCC)    Atrial flutter (HCC)    Depression    DJD (degenerative joint disease) of knee    Dysrhythmia    Chronic Atrial Fib- seeing Dr. DOROTHA [facility]   H/O peptic ulcer    past history with GI bleed- cauterization done throught scope   History of kidney stones    pt claims urology discovered a kidney stone last week 05/25/21   Hypercholesterolemia    Orthostatic hypotension    Pneumonia as child   Pneumothorax on left 05/20/2009   Prediabetes    none now   Pulmonary nodules    Sleep apnea    cpap set on 13   Thrombocytopenia (HCC)    pt denies   Thyromegaly    Varicose veins     Past Surgical History:  Procedure Laterality Date   BIOPSY  04/18/2018   Procedure: BIOPSY;  Surgeon: Saintclair Jasper, MD;  Location: [facility];  Service: Gastroenterology;;   ESOPHAGOGASTRODUODENOSCOPY N/A 04/18/2018   Procedure: ESOPHAGOGASTRODUODENOSCOPY (EGD);  Surgeon: Saintclair Jasper, MD;  Location: [facility];  Service: Gastroenterology;  Laterality: N/A;   ESOPHAGOGASTRODUODENOSCOPY (EGD) WITH PROPOFOL  N/A 01/05/2015   Procedure: ESOPHAGOGASTRODUODENOSCOPY (EGD) WITH PROPOFOL ;  Surgeon: Gladis MARLA Louder, MD;  Location: [facility];  Service: Endoscopy;  Laterality: N/A;   EXTRACORPOREAL SHOCK WAVE LITHOTRIPSY Left 08/11/2021    Procedure: LEFT EXTRACORPOREAL SHOCK WAVE LITHOTRIPSY (ESWL);  Surgeon: Selma Donnice SAUNDERS, MD;  Location: [facility];  Service: Urology;  Laterality: Left;   GASTRIC ROUX-EN-Y     '08-[name] (weight stable around 302 #   KNEE ARTHROSCOPY     NASAL SEPTOPLASTY W/ TURBINOPLASTY Bilateral 11/13/2022   Procedure: NASAL SEPTOPLASTY WITH BILATERAL TURBINATE REDUCTION;  Surgeon: Karis Clunes, MD;  Location: [facility];  Service: ENT;  Laterality: Bilateral;   TONSILLECTOMY     TOTAL KNEE ARTHROPLASTY Left 06/03/2021   Procedure: LEFT TOTAL KNEE ARTHROPLASTY;  Surgeon: Harden Jerona GAILS, MD;  Location: [facility] OR;  Service: Orthopedics;  Laterality: Left;   ULNAR NERVE TRANSPOSITION Right 15 yrs ago     reports that he has never smoked. He has never used smokeless tobacco. He reports that he does not drink alcohol and does not use drugs.  Allergies  Allergen Reactions   Aspirin  Avoid due to bariatric surgery   Bupropion Other (See Comments)    Passed out   Nsaids     Avoid due to bariatric surgery   Oxycodone  Hcl     Hallucinations, agitation     Family History  Problem Relation Age of Onset   Heart disease Mother    Heart failure Mother    Heart disease Father    Heart attack Father    Stroke Neg Hx     Prior to Admission medications   Medication Sig Start Date End Date Taking? Authorizing Provider  acetaminophen  (TYLENOL ) 650 MG CR tablet Take 1,300 mg by mouth every 8 (eight) hours.   Yes [provider]  alfuzosin  (UROXATRAL ) 10 MG 24 hr tablet Take 10 mg by mouth daily with breakfast.   Yes [provider]  amphetamine -dextroamphetamine  (ADDERALL) 5 MG tablet Take 5 mg by mouth daily as needed (for ADD).   Yes [provider]  apixaban  (ELIQUIS ) 5 MG TABS tablet Take 5 mg by mouth 2 (two) times daily.   Yes [provider]  atorvastatin  (LIPITOR) 20 MG tablet Take 20 mg by mouth at bedtime. 04/03/21  Yes [provider]  cetirizine  (ZYRTEC ) 10 MG tablet Take 1 tablet (10 mg total) by mouth daily. 06/14/23  Yes Soldatova, Liuba, MD  Coenzyme Q10 (CO Q 10 PO) Take 1 capsule by mouth daily.   Yes [provider]  desvenlafaxine (PRISTIQ) 50 MG 24 hr tablet Take 50 mg by mouth daily. 02/05/23  Yes [provider]  fludrocortisone  (FLORINEF ) 0.1 MG tablet Take 0.1 mg by mouth daily. 03/07/19  Yes [provider]  fluticasone  (FLONASE ) 50 MCG/ACT nasal spray Place 2 sprays into both nostrils daily. 06/14/23  Yes Soldatova, Liuba, MD  gabapentin  (NEURONTIN ) 300 MG capsule Take 2 capsules (600 mg total) by mouth at bedtime. 07/10/23 10/08/23 Yes Gershon Donnice SAUNDERS, DPM  Glucosamine-Chondroitin (COSAMIN DS PO) Take 1,500 mg by mouth daily. Rotate with other Joint Health   Yes [provider]  latanoprost  (XALATAN ) 0.005 % ophthalmic solution Place 1 drop into both eyes at bedtime. 04/23/21  Yes [provider]  LORazepam  (ATIVAN ) 1 MG tablet Take 1 mg by mouth every 4 (four) hours as needed.   Yes [provider]  Melatonin 10 MG TABS Take 1 tablet by mouth at bedtime.   Yes [provider]  Misc Natural Products (OSTEO BI-FLEX ADV TRIPLE ST PO) Take 1 tablet by mouth daily.   Yes [provider]  Multiple Vitamins-Minerals (ONE DAILY MULTIVITAMIN MEN) TABS Take 1 tablet by mouth 2 (two) times daily. CENTRUM SILVER 04/13/06  Yes [provider]  pantoprazole  (PROTONIX ) 40 MG tablet Take 40 mg by mouth in the morning and at bedtime. 10/24/09  Yes [provider]  Probiotic Product (PROBIOTIC PO) Take 1-2 capsules by mouth daily.   Yes [provider]  sodium chloride  (OCEAN) 0.65 % SOLN nasal spray Place 1 spray into both nostrils as needed. 06/14/23  Yes Soldatova, Liuba, MD  SUPER B COMPLEX/C PO Take 1 tablet by mouth daily.   Yes [provider]  zolpidem  (AMBIEN ) 10 MG tablet Take 5-10 mg by mouth at  bedtime. 08/12/14  Yes [provider]  sildenafil (REVATIO) 20 MG tablet Take 20 mg by mouth daily as needed.    [provider]     Physical Exam: Vitals:   07/28/23 2300 07/28/23 2315 07/29/23 0120 07/29/23 0130  BP: 90/74 90/74 92/66  Pulse: (!) 105 (!) 106 96   Resp: 13 11 18    Temp:    99.1 F (37.3 C)  TempSrc:      SpO2: 100% 100% 100%     Physical Exam Constitutional:      General: He is not in acute distress.    Appearance: He is not ill-appearing.  HENT:     Mouth/Throat:     Mouth: Mucous membranes are moist.  Eyes:     Pupils: Pupils are equal, round, and reactive to light.  Cardiovascular:     Rate and Rhythm: Normal rate. Rhythm irregular.     Pulses: Normal pulses.     Heart sounds: Normal heart sounds.  Pulmonary:     Effort: Pulmonary effort is normal.     Breath sounds: Normal breath sounds.  Abdominal:     General: Bowel sounds are normal. There is no distension.     Palpations: Abdomen is soft.     Tenderness: There is abdominal tenderness. There is no guarding.  Musculoskeletal:        General: No swelling.     Cervical back: Neck supple.     Right lower leg: No edema.     Left lower leg: No edema.  Skin:    General: Skin is dry.     Capillary Refill: Capillary refill takes less than 2 seconds.  Neurological:     Mental Status: He is alert and oriented to person, place, and time.  Psychiatric:        Mood and Affect: Mood normal.        Thought Content: Thought content normal.      Labs on Admission: I have personally reviewed following labs and imaging studies  CBC: Recent Labs  Lab 07/28/23 2135  WBC 23.8*  HGB 13.8  HCT 41.0  MCV 92.1  PLT 194   Basic Metabolic Panel: Recent Labs  Lab 07/28/23 2135  NA 137  K 4.1  CL 100  CO2 27  GLUCOSE 151*  BUN 68*  CREATININE 1.51*  CALCIUM  10.0   GFR: Estimated Creatinine Clearance: 55.1 mL/min (A) (by C-G formula based on SCr of 1.51 mg/dL (H)). Liver  Function Tests: Recent Labs  Lab 07/29/23 0014  AST 22  ALT 17  ALKPHOS 63  BILITOT 0.9  PROT 6.7  ALBUMIN  3.2*   Recent Labs  Lab 07/29/23 0014  LIPASE 29   No results for input(s): AMMONIA in the last 168 hours. Coagulation Profile: No results for input(s): INR, PROTIME in the last 168 hours. Cardiac Enzymes: No results for input(s): CKTOTAL, CKMB, CKMBINDEX, TROPONINI, TROPONINIHS in the last 168 hours. BNP (last 3 results) No results for input(s): BNP in the last 8760 hours. HbA1C: No results for input(s): HGBA1C in the last 72 hours. CBG: Recent Labs  Lab 07/28/23 2205  GLUCAP 138*   Lipid Profile: No results for input(s): CHOL, HDL, LDLCALC, TRIG, CHOLHDL, LDLDIRECT in the last 72 hours. Thyroid  Function Tests: No results for input(s): TSH, T4TOTAL, FREET4, T3FREE, THYROIDAB in the last 72 hours. Anemia Panel: No results for input(s): VITAMINB12, FOLATE, FERRITIN, TIBC, IRON, RETICCTPCT in the last 72 hours. Urine analysis:    Component Value Date/Time   COLORURINE AMBER BIOCHEMICALS MAY BE AFFECTED BY COLOR (A) 10/21/2009 1928   APPEARANCEUR CLEAR 10/21/2009 1928   LABSPEC 1.024 10/21/2009 1928   PHURINE 5.5 10/21/2009 1928   GLUCOSEU NEGATIVE 10/21/2009 1928   HGBUR NEGATIVE 10/21/2009 1928   BILIRUBINUR SMALL (A) 10/21/2009 1928  KETONESUR 15 (A) 10/21/2009 1928   PROTEINUR 30 (A) 10/21/2009 1928   UROBILINOGEN 1.0 10/21/2009 1928   NITRITE NEGATIVE 10/21/2009 1928   LEUKOCYTESUR TRACE (A) 10/21/2009 1928    Radiological Exams on Admission: I have personally reviewed images CT Chest W Contrast Result Date: 07/29/2023 CLINICAL DATA:  Sepsis, weakness, fatigue. EXAM: CT CHEST WITH CONTRAST TECHNIQUE: Multidetector CT imaging of the chest was performed during intravenous contrast administration. RADIATION DOSE REDUCTION: This exam was performed according to the departmental dose-optimization program  which includes automated exposure control, adjustment of the mA and/or kV according to patient size and/or use of iterative reconstruction technique. CONTRAST:  75mL OMNIPAQUE  IOHEXOL  350 MG/ML SOLN COMPARISON:  01/20/2011 FINDINGS: Cardiovascular: Heart is normal size. Aorta is normal caliber. Diffuse 3 vessel coronary artery disease. Aortic atherosclerosis. Mediastinum/Nodes: No mediastinal, hilar, or axillary adenopathy. Trachea and esophagus are unremarkable. Thyroid  unremarkable. Lungs/Pleura: Lungs are clear. No focal airspace opacities or suspicious nodules. No effusions. Upper Abdomen: See abdominal CT report Musculoskeletal: Chest wall soft tissues are unremarkable. No acute bony abnormality. IMPRESSION: No acute cardiopulmonary disease. Three vessel coronary artery disease. Aortic Atherosclerosis (ICD10-I70.0). Electronically Signed   By: Franky Crease M.D.   On: 07/29/2023 00:15   CT Head Wo Contrast Result Date: 07/29/2023 CLINICAL DATA:  Memory loss EXAM: CT HEAD WITHOUT CONTRAST TECHNIQUE: Contiguous axial images were obtained from the base of the skull through the vertex without intravenous contrast. RADIATION DOSE REDUCTION: This exam was performed according to the departmental dose-optimization program which includes automated exposure control, adjustment of the mA and/or kV according to patient size and/or use of iterative reconstruction technique. COMPARISON:  12/07/2022 FINDINGS: Brain: There is atrophy and chronic small vessel disease changes. No acute intracranial abnormality. Specifically, no hemorrhage, hydrocephalus, mass lesion, acute infarction, or significant intracranial injury. Vascular: No hyperdense vessel or unexpected calcification. Skull: No acute calvarial abnormality. Sinuses/Orbits: No acute findings Other: None IMPRESSION: Atrophy, chronic microvascular disease. No acute intracranial abnormality. Electronically Signed   By: Franky Crease M.D.   On: 07/29/2023 00:14   CT  ABDOMEN PELVIS W CONTRAST Result Date: 07/29/2023 CLINICAL DATA:  Weakness, fatigue.  Abdominal pain. EXAM: CT ABDOMEN AND PELVIS WITH CONTRAST TECHNIQUE: Multidetector CT imaging of the abdomen and pelvis was performed using the standard protocol following bolus administration of intravenous contrast. RADIATION DOSE REDUCTION: This exam was performed according to the departmental dose-optimization program which includes automated exposure control, adjustment of the mA and/or kV according to patient size and/or use of iterative reconstruction technique. CONTRAST:  75mL OMNIPAQUE  IOHEXOL  350 MG/ML SOLN COMPARISON:  06/06/2021 FINDINGS: Lower chest: No acute abnormality. Hepatobiliary: Multiple layering gallstones within the gallbladder. Gallbladder is distended with gallbladder wall thickening. Findings concerning for acute cholecystitis. No focal hepatic abnormality. Mild periportal edema. No biliary ductal dilatation. Pancreas: Atrophy of the pancreas. No focal abnormality or ductal dilatation. Spleen: No focal abnormality.  Normal size. Adrenals/Urinary Tract: Adrenal glands normal. Numerous bilateral nonobstructing renal stones. Bilateral renal cysts are similar prior study. No follow-up imaging recommended. No definite ureteral stones. In the left pelvis posterior to the bladder in the region of the distal left ureter. The ureter is decompressed and difficult to follows/visualized. Given the decompressed state, I favor this is likely an adjacent phlebolith. Stomach/Bowel: Prior Roux-en-Y gastric bypass. Stomach, large and small bowel grossly unremarkable. Vascular/Lymphatic: Aortoiliac atherosclerosis. No evidence of aneurysm or adenopathy. Reproductive: No visible focal abnormality. Other: No free fluid or free air. Musculoskeletal: No acute bony abnormality. IMPRESSION: Cholelithiasis. Gallbladder distension  with gallbladder wall thickening and surrounding inflammation concerning for acute cholecystitis.  Periportal edema can be seen with volume overload/resuscitation or inflammatory processes such as hepatitis. Bilateral nephrolithiasis.  No hydronephrosis. Prior gastric bypass.  No visible complicating feature. Aortoiliac atherosclerosis. Electronically Signed   By: Franky Crease M.D.   On: 07/29/2023 00:13   DG Ribs Unilateral W/Chest Right Result Date: 07/28/2023 CLINICAL DATA:  Recent fall with right rib pain, initial encounter EXAM: RIGHT RIBS AND CHEST - 3+ VIEW COMPARISON:  07/31/2022 FINDINGS: Cardiac shadow is within normal limits. Aortic calcifications are noted. The lungs are clear bilaterally. No infiltrate or pneumothorax is seen. No acute rib abnormality noted. IMPRESSION: No acute rib fracture seen. Electronically Signed   By: Oneil Devonshire M.D.   On: 07/28/2023 23:39     EKG: My personal interpretation of EKG shows: Atrial fibrillation rate controlled heart rate 102.    Assessment/Plan: Principal Problem:   Acute cholecystitis Active Problems:   Hepatitis   AKI (acute kidney injury) (HCC)   Sepsis (HCC)   Hyperlipidemia   OSA on CPAP   Paroxysmal atrial fibrillation (HCC)   History of chronic hypotension   GAD (generalized anxiety disorder)    Assessment and Plan: Acute cholecystitis Sepsis > Patient presenting complaining of nausea, abdominal pain, ongoing diarrhea for last 1 month. - Presentation and patient found tachycardic, hypotensive and have significant leukocytosis 23K.  Normal lactic acid level.  CT abdomen pelvis showed acute cholecystitis and hepatitis.  Patient meets sepsis criteria. - Blood cultures are in process. - CT abdomen pelvis showed Cholelithiasis. Gallbladder distension with gallbladder wall thickening and surrounding inflammation concerning for acute cholecystitis. -In the ED patient received 1 dose of Zosyn . - Plan to continue IV Zosyn  with pharmacy consult. - ED physician has been spoken with on-call general surgery Dr. Stevie recommended  keep patient n.p.o. and plan for cholecystectomy in the daytime. - Holding Eliquis  until surgical intervention. - Continue maintenance fluid NS 125 cc/h and resuming fludrocortisone  0.1 mg daily. -Need to follow-up with blood cultures for further antibiotic guidance. Addendum: Patient has inconsistent history of Eliquis  use.  To me patient reported last took on 07/26/23 morning and to general surgery he stated 07/28/23 morning. - General surgery Dr. Stevie recommended off Eliquis  for 48-hour afterward  will plan for lap cholecystectomy versus cholecystectomy.  Will continue clear liquid diet.  Hepatitis - CT abdomen pelvis showed periportal edema which can seem volume overload/resuscitation or inflammatory process such as hepatitis. - Checking hepatic and acute hepatitis panel.   Prerenal acute kidney injury Bilateral nephrolithiasis-without any hydronephrosis -Prerenal acute kidney injury in the setting of hypotension/sepsis -Creatinine 1.51.  Baseline normal renal function.  Checking UA, urine creatinine and sodium. -Continue maintenance fluid NS 125 cc/h. -Continue to monitor renal function, avoid nephrotoxic agent and monitor urine output  History of paroxysmal atrial fibrillation -Last dose of Eliquis  in the morning of 07/26/2023. - At home patient takes Eliquis  5 mg twice daily.  Holding Eliquis  for surgical intervention in the daytime will need to resume afterward. - Per chart review of the cardiology note from patient is not on any rate control medication and currently atrial fibrillation rate controlled.  Hyperlipidemia -Holding Lipitor until hepatic function function panel will be resulted.   History of chronic hypotension - Continue fludrocortisone  0.1 mg daily  OSA on CPAP - Continue CPAP at the bedtime  Generalized anxiety disorder -Continue Effexor  37.5 mg daily and Ativan  1 mg every 4 hours as needed for anxiety.  Insomnia - Patient reported taking Ambien  5 to 10 mg  every night as needed help with sleep.  Continue Ambien  5 mg at bedtime as needed.  Peripheral neuropathy -Holding gabapentin  in setting of AKI.  DVT prophylaxis:  SCDs.  Deferring pharmacological prophylaxis until cholecystectomy and need to resume Eliquis  after cholecystomy will be completed Code Status:  Full Code Diet: Currently n.p.o.  for cholecystectomy in the daytime Family Communication:   None present Disposition Plan: Waiting for formal surgical consult and cholecystectomy in the daytime.  Need to resume Eliquis  after surgery.  Tentative discharge to home next 2 to 3 days Consults: General Surgery Admission status:   Inpatient, Telemetry bed  Severity of Illness: The appropriate patient status for this patient is INPATIENT. Inpatient status is judged to be reasonable and necessary in order to provide the required intensity of service to ensure the patient's safety. The patient's presenting symptoms, physical exam findings, and initial radiographic and laboratory data in the context of their chronic comorbidities is felt to place them at high risk for further clinical deterioration. Furthermore, it is not anticipated that the patient will be medically stable for discharge from the hospital within 2 midnights of admission.   * I certify that at the point of admission it is my clinical judgment that the patient will require inpatient hospital care spanning beyond 2 midnights from the point of admission due to high intensity of service, high risk for further deterioration and high frequency of surveillance required.DEWAINE    Patrice Moates, MD [facility]  How to contact the [facility] Attending or Consulting provider 7A - 7P or covering provider during after hours 7P -7A, for this patient.  Check the care team in [facility] and look for a) attending/consulting [facility] provider listed and b) the [facility] team listed Log into [email or web address] and use [facility]'s universal password to access. If you do not  have the password, please contact the hospital operator. Locate the [facility] provider you are looking for under [facility] and page to a number that you can be directly reached. If you still have difficulty reaching the provider, please page the [facility] (Director on Call) for the Hospitalists listed on amion for assistance.  07/29/2023, 3:19 AM

## 2023-07-29 NOTE — ED Notes (Signed)
 Admitting at bedside

## 2023-07-29 NOTE — ED Provider Notes (Signed)
 [place] [facility] AT [facility] Provider Note   CSN: [id number] Arrival date & time: 07/28/23  2110     History  Chief Complaint  Patient presents with   Weakness    Francisco Padilla is a 73 y.o. male with medical history of atrial fibrillation on Eliquis , history of kidney stones, asthma, depression.  Patient presents to ED for evaluation of generalized weakness for the last 1 month.  Reports that over the last 1 month he has been experiencing generalized weakness and fatigue.  Reports that for the last 3 days he is doing nothing but sleeping.  States he has had no appetite for the last 3 days as well.  Reports that anytime he eats food he will immediately have a bowel movement but denies any diarrhea.  He is endorsing abdominal pain in his lower abdomen as well as his right upper quadrant.  Denies fevers at home.  He denies nausea or vomiting.  He endorses dysuria but states that he has chronic kidney stones.  Denies one-sided weakness or numbness.  Denies headaches, fevers.  Had nausea last night but no vomiting.  Denies alcohol or drugs.  He is alert and orient x 4.  Of note, patient also reports that he fell 2 nights ago and reports right-sided chest wall pain.   Weakness Associated symptoms: dysuria        Home Medications Prior to Admission medications   Medication Sig Start Date End Date Taking? Authorizing Provider  acetaminophen  (TYLENOL ) 650 MG CR tablet Take 1,300 mg by mouth every 8 (eight) hours.   Yes [provider]  alfuzosin  (UROXATRAL ) 10 MG 24 hr tablet Take 10 mg by mouth daily with breakfast.   Yes [provider]  amphetamine -dextroamphetamine  (ADDERALL) 5 MG tablet Take 5 mg by mouth daily as needed (for ADD).   Yes [provider]  apixaban  (ELIQUIS ) 5 MG TABS tablet Take 5 mg by mouth 2 (two) times daily.   Yes [provider]  atorvastatin  (LIPITOR) 20 MG tablet Take 20 mg by mouth at bedtime. 04/03/21   Yes [provider]  cetirizine  (ZYRTEC ) 10 MG tablet Take 1 tablet (10 mg total) by mouth daily. 06/14/23  Yes Soldatova, Liuba, MD  Coenzyme Q10 (CO Q 10 PO) Take 1 capsule by mouth daily.   Yes [provider]  desvenlafaxine (PRISTIQ) 50 MG 24 hr tablet Take 50 mg by mouth daily. 02/05/23  Yes [provider]  fludrocortisone  (FLORINEF ) 0.1 MG tablet Take 0.1 mg by mouth daily. 03/07/19  Yes [provider]  fluticasone  (FLONASE ) 50 MCG/ACT nasal spray Place 2 sprays into both nostrils daily. 06/14/23  Yes Soldatova, Liuba, MD  gabapentin  (NEURONTIN ) 300 MG capsule Take 2 capsules (600 mg total) by mouth at bedtime. 07/10/23 10/08/23 Yes Gershon Donnice SAUNDERS, DPM  Glucosamine-Chondroitin (COSAMIN DS PO) Take 1,500 mg by mouth daily. Rotate with other Joint [facility]   Yes [provider]  latanoprost  (XALATAN ) 0.005 % ophthalmic solution Place 1 drop into both eyes at bedtime. 04/23/21  Yes [provider]  LORazepam  (ATIVAN ) 1 MG tablet Take 1 mg by mouth every 4 (four) hours as needed.   Yes [provider]  Melatonin 10 MG TABS Take 1 tablet by mouth at bedtime.   Yes [provider]  Misc Natural Products (OSTEO BI-FLEX ADV TRIPLE ST PO) Take 1 tablet by mouth daily.   Yes [provider]  Multiple Vitamins-Minerals (ONE DAILY MULTIVITAMIN MEN) TABS  Take 1 tablet by mouth 2 (two) times daily. CENTRUM SILVER 04/13/06  Yes [provider]  pantoprazole  (PROTONIX ) 40 MG tablet Take 40 mg by mouth in the morning and at bedtime. 10/24/09  Yes [provider]  Probiotic Product (PROBIOTIC PO) Take 1-2 capsules by mouth daily.   Yes [provider]  sildenafil (REVATIO) 20 MG tablet Take 20 mg by mouth daily as needed.   Yes [provider]  sodium chloride  (OCEAN) 0.65 % SOLN nasal spray Place 1 spray into both nostrils as needed. 06/14/23  Yes Soldatova, Liuba, MD  SUPER B COMPLEX/C PO Take 1  tablet by mouth daily.   Yes [provider]  zolpidem  (AMBIEN ) 10 MG tablet Take 5-10 mg by mouth at bedtime. 08/12/14  Yes [provider]      Allergies    Aspirin, Bupropion, Nsaids, and Oxycodone  hcl    Review of Systems   Review of Systems  Constitutional:  Positive for activity change and fatigue.  Genitourinary:  Positive for dysuria.  Neurological:  Positive for weakness. Negative for numbness.  All other systems reviewed and are negative.   Physical Exam Updated Vital Signs BP 92/66   Pulse 96   Temp 99.1 F (37.3 C)   Resp 18   SpO2 100%  Physical Exam Vitals and nursing note reviewed.  Constitutional:      General: He is not in acute distress.    Appearance: Normal appearance. He is not ill-appearing, toxic-appearing or diaphoretic.  HENT:     Head: Normocephalic and atraumatic.     Nose: Nose normal.  Eyes:     Extraocular Movements: Extraocular movements intact.     Conjunctiva/sclera: Conjunctivae normal.     Pupils: Pupils are equal, round, and reactive to light.  Cardiovascular:     Rate and Rhythm: Normal rate and regular rhythm.  Pulmonary:     Effort: Pulmonary effort is normal.     Breath sounds: Normal breath sounds. No wheezing.  Chest:    Abdominal:     General: Abdomen is flat. Bowel sounds are normal.     Palpations: Abdomen is soft.     Tenderness: There is abdominal tenderness.     Comments: Right upper quadrant tenderness  Musculoskeletal:     Cervical back: Normal range of motion and neck supple. No tenderness.  Skin:    General: Skin is warm and dry.     Capillary Refill: Capillary refill takes less than 2 seconds.  Neurological:     General: No focal deficit present.     Mental Status: He is alert and oriented to person, place, and time.     GCS: GCS eye subscore is 4. GCS verbal subscore is 5. GCS motor subscore is 6.     Cranial Nerves: Cranial nerves 2-12 are intact. No cranial nerve deficit.     Sensory:  Sensation is intact. No sensory deficit.     Motor: Motor function is intact. No weakness.     ED Results / Procedures / Treatments   Labs (all labs ordered are listed, but only abnormal results are displayed) Labs Reviewed  BASIC METABOLIC PANEL - Abnormal; Notable for the following components:      Result Value   Glucose, Bld 151 (*)    BUN 68 (*)    Creatinine, Ser 1.51 (*)    GFR, Estimated 49 (*)    All other components within normal limits  CBC - Abnormal; Notable for the following components:  WBC 23.8 (*)    All other components within normal limits  HEPATIC FUNCTION PANEL - Abnormal; Notable for the following components:   Albumin  3.2 (*)    Bilirubin, Direct 0.3 (*)    All other components within normal limits  CBG MONITORING, ED - Abnormal; Notable for the following components:   Glucose-Capillary 138 (*)    All other components within normal limits  CULTURE, BLOOD (ROUTINE X 2)  RESP PANEL BY RT-PCR (RSV, FLU A&B, COVID)  [id number]  CULTURE, BLOOD (ROUTINE X 2)  LIPASE, BLOOD  URINALYSIS, ROUTINE W REFLEX MICROSCOPIC  HEPATITIS PANEL, ACUTE  COMPREHENSIVE METABOLIC PANEL  CBC  HEPATIC FUNCTION PANEL  CREATININE, URINE, RANDOM  SODIUM, URINE, RANDOM  I-STAT CG4 LACTIC ACID, ED  I-STAT CG4 LACTIC ACID, ED    EKG EKG Interpretation Date/Time:  Saturday July 28 2023 21:16:20 EST Ventricular Rate:  102 PR Interval:    QRS Duration:  95 QT Interval:  340 QTC Calculation: 443 R Axis:   9  Text Interpretation: Atrial fibrillation RSR' in V1 or V2, probably normal variant Confirmed by Franklyn Gills [phone]) on 07/28/2023 10:06:45 PM  Radiology CT Chest W Contrast Result Date: 07/29/2023 CLINICAL DATA:  Sepsis, weakness, fatigue. EXAM: CT CHEST WITH CONTRAST TECHNIQUE: Multidetector CT imaging of the chest was performed during intravenous contrast administration. RADIATION DOSE REDUCTION: This exam was performed according to the departmental dose-optimization  program which includes automated exposure control, adjustment of the mA and/or kV according to patient size and/or use of iterative reconstruction technique. CONTRAST:  75mL OMNIPAQUE  IOHEXOL  350 MG/ML SOLN COMPARISON:  01/20/2011 FINDINGS: Cardiovascular: Heart is normal size. Aorta is normal caliber. Diffuse 3 vessel coronary artery disease. Aortic atherosclerosis. Mediastinum/Nodes: No mediastinal, hilar, or axillary adenopathy. Trachea and esophagus are unremarkable. Thyroid  unremarkable. Lungs/Pleura: Lungs are clear. No focal airspace opacities or suspicious nodules. No effusions. Upper Abdomen: See abdominal CT report Musculoskeletal: Chest wall soft tissues are unremarkable. No acute bony abnormality. IMPRESSION: No acute cardiopulmonary disease. Three vessel coronary artery disease. Aortic Atherosclerosis (ICD10-I70.0). Electronically Signed   By: Franky Crease M.D.   On: 07/29/2023 00:15   CT Head Wo Contrast Result Date: 07/29/2023 CLINICAL DATA:  Memory loss EXAM: CT HEAD WITHOUT CONTRAST TECHNIQUE: Contiguous axial images were obtained from the base of the skull through the vertex without intravenous contrast. RADIATION DOSE REDUCTION: This exam was performed according to the departmental dose-optimization program which includes automated exposure control, adjustment of the mA and/or kV according to patient size and/or use of iterative reconstruction technique. COMPARISON:  12/07/2022 FINDINGS: Brain: There is atrophy and chronic small vessel disease changes. No acute intracranial abnormality. Specifically, no hemorrhage, hydrocephalus, mass lesion, acute infarction, or significant intracranial injury. Vascular: No hyperdense vessel or unexpected calcification. Skull: No acute calvarial abnormality. Sinuses/Orbits: No acute findings Other: None IMPRESSION: Atrophy, chronic microvascular disease. No acute intracranial abnormality. Electronically Signed   By: Franky Crease M.D.   On: 07/29/2023 00:14    CT ABDOMEN PELVIS W CONTRAST Result Date: 07/29/2023 CLINICAL DATA:  Weakness, fatigue.  Abdominal pain. EXAM: CT ABDOMEN AND PELVIS WITH CONTRAST TECHNIQUE: Multidetector CT imaging of the abdomen and pelvis was performed using the standard protocol following bolus administration of intravenous contrast. RADIATION DOSE REDUCTION: This exam was performed according to the departmental dose-optimization program which includes automated exposure control, adjustment of the mA and/or kV according to patient size and/or use of iterative reconstruction technique. CONTRAST:  75mL OMNIPAQUE  IOHEXOL  350 MG/ML SOLN COMPARISON:  06/06/2021 FINDINGS: Lower  chest: No acute abnormality. Hepatobiliary: Multiple layering gallstones within the gallbladder. Gallbladder is distended with gallbladder wall thickening. Findings concerning for acute cholecystitis. No focal hepatic abnormality. Mild periportal edema. No biliary ductal dilatation. Pancreas: Atrophy of the pancreas. No focal abnormality or ductal dilatation. Spleen: No focal abnormality.  Normal size. Adrenals/Urinary Tract: Adrenal glands normal. Numerous bilateral nonobstructing renal stones. Bilateral renal cysts are similar prior study. No follow-up imaging recommended. No definite ureteral stones. In the left pelvis posterior to the bladder in the region of the distal left ureter. The ureter is decompressed and difficult to follows/visualized. Given the decompressed state, I favor this is likely an adjacent phlebolith. Stomach/Bowel: Prior Roux-en-Y gastric bypass. Stomach, large and small bowel grossly unremarkable. Vascular/Lymphatic: Aortoiliac atherosclerosis. No evidence of aneurysm or adenopathy. Reproductive: No visible focal abnormality. Other: No free fluid or free air. Musculoskeletal: No acute bony abnormality. IMPRESSION: Cholelithiasis. Gallbladder distension with gallbladder wall thickening and surrounding inflammation concerning for acute  cholecystitis. Periportal edema can be seen with volume overload/resuscitation or inflammatory processes such as hepatitis. Bilateral nephrolithiasis.  No hydronephrosis. Prior gastric bypass.  No visible complicating feature. Aortoiliac atherosclerosis. Electronically Signed   By: Franky Crease M.D.   On: 07/29/2023 00:13   DG Ribs Unilateral W/Chest Right Result Date: 07/28/2023 CLINICAL DATA:  Recent fall with right rib pain, initial encounter EXAM: RIGHT RIBS AND CHEST - 3+ VIEW COMPARISON:  07/31/2022 FINDINGS: Cardiac shadow is within normal limits. Aortic calcifications are noted. The lungs are clear bilaterally. No infiltrate or pneumothorax is seen. No acute rib abnormality noted. IMPRESSION: No acute rib fracture seen. Electronically Signed   By: Oneil Devonshire M.D.   On: 07/28/2023 23:39    Procedures Procedures   Medications Ordered in ED Medications  venlafaxine  XR (EFFEXOR -XR) 24 hr capsule 37.5 mg (has no administration in time range)  LORazepam  (ATIVAN ) tablet 1 mg (has no administration in time range)  pantoprazole  (PROTONIX ) EC tablet 40 mg (has no administration in time range)  sodium chloride  flush (NS) 0.9 % injection 3 mL (has no administration in time range)  0.9 %  sodium chloride  infusion ( Intravenous New Bag/Given 07/29/23 0227)  sodium chloride  flush (NS) 0.9 % injection 3 mL (has no administration in time range)  sodium chloride  flush (NS) 0.9 % injection 3 mL (has no administration in time range)  0.9 %  sodium chloride  infusion (has no administration in time range)  acetaminophen  (TYLENOL ) tablet 650 mg (has no administration in time range)    Or  acetaminophen  (TYLENOL ) suppository 650 mg (has no administration in time range)  senna-docusate (Senokot-S) tablet 1 tablet (has no administration in time range)  fludrocortisone  (FLORINEF ) tablet 0.1 mg (has no administration in time range)  piperacillin -tazobactam (ZOSYN ) IVPB 3.375 g (has no administration in time range)   sodium chloride  0.9 % bolus 1,000 mL (0 mLs Intravenous Stopped 07/29/23 0223)  iohexol  (OMNIPAQUE ) 350 MG/ML injection 75 mL (75 mLs Intravenous Contrast Given 07/28/23 2352)  piperacillin -tazobactam (ZOSYN ) IVPB 3.375 g (0 g Intravenous Stopped 07/29/23 0147)    ED Course/ Medical Decision Making/ A&P Clinical Course as of 07/29/23 0307  Sat Jul 28, 2023  2306 1 month of increasing weakness, forgetfulness, didn't wake up for three days recently. Abdominal pain, increased bowel movements, no nausea or vomiting. Endorsing dysuria but states he has chronic kidney stones. No one sided weakness, numbness. Very hard to get patient to remain on track in terms of giving history. No headaches, no fevers. Nausea last night.  Decreased oral intake secondary to weakness. No diarrhea just frequent Bms.  [CG]  2310 No alcohol, drugs.  [CG]  2346 WBC(!): 23.8 [SG]  Sun Jul 29, 2023  0020 Glucose(!): 151 Aki, give fluids [SG]  0020 CTAP w/ acute cholecystitis, start zosyn  - pt high risk, will consult gen surg. Continue resuscitation  [SG]  0059 Kinzinger [CG]    Clinical Course User Index [CG] Ruthell Lonni FALCON, PA-C [SG] Elnor Jayson LABOR, DO    Medical Decision Making Amount and/or Complexity of Data Reviewed Labs: ordered. Decision-making details documented in ED Course. Radiology: ordered.  Risk Prescription drug management. Decision regarding hospitalization.   50 your male presents for evaluation.  Please see HPI for further details.  On exam patient is afebrile, tachycardic.  Patient appears to be in A-fib however no RVR.  Blood pressure slightly soft.  Lung sounds clear bilaterally, not hypoxic.  Abdomen has tenderness in her upper quadrant as well as lower abdominal fields.  Neurological examinations at baseline, alert and oriented x 3, no deficits on exam.  Will collect CT scan of abdomen, head.  Will also collect x-ray of right-sided ribs to assess for any kind of rib fractures.  Will  collect lab work, lactic, blood cultures.  Patient CBC with leukocytosis of 23.8.  His lactic is not elevated at 1.4.  Metabolic panel shows creatinine 1.5.  3 weeks ago patient creatinine was 0.77.  Patient has AKI.  Started on fluids.  Hepatic function panel no elevated LFTs.  Lipase 29.  Viral panel negative for all.  CT abdomen pelvis shows cholelithiasis.  Gallbladder is distended with gallbladder wall thickening and surrounding inflammation concerning for acute cholecystitis.  Patient also has periportal edema which can be seen with volume overload/resuscitation or in the setting of inflammatory hepatitis.  Have added on hepatitis panel.  Have reached out to general surgery, Dr. Stevie, who states that they will consult on the patient.  Patient will require admission to medicine for AKI, cholecystitis.  Patient started on Zosyn  at this time for leukocytosis in the setting of cholecystitis.  Plan for imaging of patient right sided chest wall shows no acute fractures.  CT head unremarkable.  CT chest with contrast collected which does not show any kind of concomitant rib fractures.  Patient admitted to hospitalist, Dr. Sundil.  Stable at time of admission.   Final Clinical Impression(s) / ED Diagnoses Final diagnoses:  Cholecystitis    Rx / DC Orders ED Discharge Orders     None         Ruthell Lonni FALCON, PA-C 07/29/23 0307    Elnor Jayson LABOR, DO 07/29/23 [id number]

## 2023-07-29 NOTE — Consult Note (Signed)
 Reason for Consult:abdominal pain Referring Provider: [name] is an 73 y.o. male.  HPI: 73 yo male with history of RNY gastric bypass ([name] 2008) presents with abdominal pain. He has had a 1 month history of IBS like symptoms. He had acute right sided pain for the last 3 days. He has had fatigue and weakness this week.  Past Medical History:  Diagnosis Date   ADD (attention deficit disorder)    Anxiety 2007   Asthma as child   Asymptomatic gallstones    Atrial fibrillation (HCC)    Atrial flutter (HCC)    Depression    DJD (degenerative joint disease) of knee    Dysrhythmia    Chronic Atrial Fib- seeing Dr. [facility]   H/O peptic ulcer    past history with GI bleed- cauterization done throught scope   History of kidney stones    pt claims urology discovered a kidney stone last week 05/25/21   Hypercholesterolemia    Orthostatic hypotension    Pneumonia as child   Pneumothorax on left 05/20/2009   Prediabetes    none now   Pulmonary nodules    Sleep apnea    cpap set on 13   Thrombocytopenia (HCC)    pt denies   Thyromegaly    Varicose veins     Past Surgical History:  Procedure Laterality Date   BIOPSY  04/18/2018   Procedure: BIOPSY;  Surgeon: Saintclair Jasper, MD;  Location: [facility];  Service: Gastroenterology;;   ESOPHAGOGASTRODUODENOSCOPY N/A 04/18/2018   Procedure: ESOPHAGOGASTRODUODENOSCOPY (EGD);  Surgeon: Saintclair Jasper, MD;  Location: [facility];  Service: Gastroenterology;  Laterality: N/A;   ESOPHAGOGASTRODUODENOSCOPY (EGD) WITH PROPOFOL  N/A 01/05/2015   Procedure: ESOPHAGOGASTRODUODENOSCOPY (EGD) WITH PROPOFOL ;  Surgeon: Gladis MARLA Louder, MD;  Location: [facility];  Service: Endoscopy;  Laterality: N/A;   EXTRACORPOREAL SHOCK WAVE LITHOTRIPSY Left 08/11/2021   Procedure: LEFT EXTRACORPOREAL SHOCK WAVE LITHOTRIPSY (ESWL);  Surgeon: Selma Donnice SAUNDERS, MD;  Location[facility];  Service: Urology;   Laterality: Left;   GASTRIC ROUX-EN-Y     '08-[name] (weight stable around 302 #   KNEE ARTHROSCOPY     NASAL SEPTOPLASTY W/ TURBINOPLASTY Bilateral 11/13/2022   Procedure: NASAL SEPTOPLASTY WITH BILATERAL TURBINATE REDUCTION;  Surgeon: Karis Clunes, MD;  Location: [facility];  Service: ENT;  Laterality: Bilateral;   TONSILLECTOMY     TOTAL KNEE ARTHROPLASTY Left 06/03/2021   Procedure: LEFT TOTAL KNEE ARTHROPLASTY;  Surgeon: Harden Jerona GAILS, MD;  Location: [facility];  Service: Orthopedics;  Laterality: Left;   ULNAR NERVE TRANSPOSITION Right 15 yrs ago    Family History  Problem Relation Age of Onset   Heart disease Mother    Heart failure Mother    Heart disease Father    Heart attack Father    Stroke Neg Hx     Social History:  reports that he has never smoked. He has never used smokeless tobacco. He reports that he does not drink alcohol and does not use drugs.  Allergies:  Allergies  Allergen Reactions   Aspirin     Avoid due to bariatric surgery   Bupropion Other (See Comments)    Passed out   Nsaids     Avoid due to bariatric surgery   Oxycodone  Hcl     Hallucinations, agitation     Medications: I have reviewed the patient's current medications.  Results for orders placed or performed during the hospital encounter of 07/28/23 (  from the past 48 hours)  Basic metabolic panel     Status: Abnormal   Collection Time: 07/28/23  9:35 PM  Result Value Ref Range   Sodium 137 135 - 145 mmol/L   Potassium 4.1 3.5 - 5.1 mmol/L   Chloride 100 98 - 111 mmol/L   CO2 27 22 - 32 mmol/L   Glucose, Bld 151 (H) 70 - 99 mg/dL    Comment: Glucose reference range applies only to samples taken after fasting for at least 8 hours.   BUN 68 (H) 8 - 23 mg/dL   Creatinine, Ser 8.48 (H) 0.61 - 1.24 mg/dL   Calcium  10.0 8.9 - 10.3 mg/dL   GFR, Estimated 49 (L) >60 mL/min    Comment: (NOTE) Calculated using the CKD-EPI Creatinine Equation (2021)    Anion gap 10 5 - 15    Comment:  Performed at [facility], [address], [place], [place] [address]  CBC     Status: Abnormal   Collection Time: 07/28/23  9:35 PM  Result Value Ref Range   WBC 23.8 (H) 4.0 - 10.5 K/uL   RBC 4.45 4.22 - 5.81 MIL/uL   Hemoglobin 13.8 13.0 - 17.0 g/dL   HCT 58.9 60.9 - 47.9 %   MCV 92.1 80.0 - 100.0 fL   MCH 31.0 26.0 - 34.0 pg   MCHC 33.7 30.0 - 36.0 g/dL   RDW 86.8 88.4 - 84.4 %   Platelets 194 150 - 400 K/uL   nRBC 0.0 0.0 - 0.2 %    Comment: Performed at [facility], [address], [place], [place] [address]  CBG monitoring, ED     Status: Abnormal   Collection Time: 07/28/23 10:05 PM  Result Value Ref Range   Glucose-Capillary 138 (H) 70 - 99 mg/dL    Comment: Glucose reference range applies only to samples taken after fasting for at least 8 hours.  Blood culture (routine x 2)     Status: None (Preliminary result)   Collection Time: 07/29/23 12:13 AM   Specimen: BLOOD LEFT ARM  Result Value Ref Range   Specimen Description BLOOD LEFT ARM    Special Requests      BOTTLES DRAWN AEROBIC AND ANAEROBIC Blood Culture adequate volume Performed at [facility], [address], [place], [place] [address]    Culture PENDING    Report Status PENDING   Hepatic function panel     Status: Abnormal   Collection Time: 07/29/23 12:14 AM  Result Value Ref Range   Total Protein 6.7 6.5 - 8.1 g/dL   Albumin  3.2 (L) 3.5 - 5.0 g/dL   AST 22 15 - 41 U/L   ALT 17 0 - 44 U/L   Alkaline Phosphatase 63 38 - 126 U/L   Total Bilirubin 0.9 0.0 - 1.2 mg/dL   Bilirubin, Direct 0.3 (H) 0.0 - 0.2 mg/dL   Indirect Bilirubin 0.6 0.3 - 0.9 mg/dL    Comment: Performed at [facility], [address], [place], [place] [address]  Lipase, blood     Status: None   Collection Time: 07/29/23 12:14 AM  Result Value Ref Range   Lipase 29 11 - 51 U/L    Comment: Performed at [facility], [address], [place], [place] [address]  I-Stat CG4 Lactic Acid     Status: None    Collection Time: 07/29/23 12:22 AM  Result Value Ref Range   Lactic Acid, Venous  1.4 0.5 - 1.9 mmol/L  Resp panel by RT-PCR (RSV, Flu A&B, Covid) Anterior Nasal Swab     Status: None   Collection Time: 07/29/23  1:30 AM   Specimen: Anterior Nasal Swab  Result Value Ref Range   SARS Coronavirus 2 by RT PCR NEGATIVE NEGATIVE   Influenza A by PCR NEGATIVE NEGATIVE   Influenza B by PCR NEGATIVE NEGATIVE    Comment: (NOTE) The Xpert Xpress SARS-CoV-2/FLU/RSV plus assay is intended as an aid in the diagnosis of influenza from Nasopharyngeal swab specimens and should not be used as a sole basis for treatment. Nasal washings and aspirates are unacceptable for Xpert Xpress SARS-CoV-2/FLU/RSV testing.  Fact Sheet for Patients: bloggercourse.com  Fact Sheet for Healthcare Providers: seriousbroker.it  This test is not yet approved or cleared by the [place]  FDA and has been authorized for detection and/or diagnosis of SARS-CoV-2 by FDA under an Emergency Use Authorization (EUA). This EUA will remain in effect (meaning this test can be used) for the duration of the COVID-19 declaration under Section 564(b)(1) of the Act, 21 U.S.C. section 360bbb-3(b)(1), unless the authorization is terminated or revoked.     Resp Syncytial Virus by PCR NEGATIVE NEGATIVE    Comment: (NOTE) Fact Sheet for Patients: bloggercourse.com  Fact Sheet for Healthcare Providers: seriousbroker.it  This test is not yet approved or cleared by the [place]  FDA and has been authorized for detection and/or diagnosis of SARS-CoV-2 by FDA under an Emergency Use Authorization (EUA). This EUA will remain in effect (meaning this test can be used) for the duration of the COVID-19 declaration under Section 564(b)(1) of the Act, 21 U.S.C. section 360bbb-3(b)(1), unless the authorization is terminated  or revoked.  Performed at [facility], [address], [place], [place] [address]   Creatinine, urine, random     Status: None   Collection Time: 07/29/23  4:21 AM  Result Value Ref Range   Creatinine, Urine 105 mg/dL    Comment: Performed at [facility], [address], [place], [place] [address]  Sodium, urine, random     Status: None   Collection Time: 07/29/23  4:21 AM  Result Value Ref Range   Sodium, Ur <10 mmol/L    Comment: Performed at [facility], [address], [place], [place] [address]    CT Chest W Contrast Result Date: 07/29/2023 CLINICAL DATA:  Sepsis, weakness, fatigue. EXAM: CT CHEST WITH CONTRAST TECHNIQUE: Multidetector CT imaging of the chest was performed during intravenous contrast administration. RADIATION DOSE REDUCTION: This exam was performed according to the departmental dose-optimization program which includes automated exposure control, adjustment of the mA and/or kV according to patient size and/or use of iterative reconstruction technique. CONTRAST:  75mL OMNIPAQUE  IOHEXOL  350 MG/ML SOLN COMPARISON:  01/20/2011 FINDINGS: Cardiovascular: Heart is normal size. Aorta is normal caliber. Diffuse 3 vessel coronary artery disease. Aortic atherosclerosis. Mediastinum/Nodes: No mediastinal, hilar, or axillary adenopathy. Trachea and esophagus are unremarkable. Thyroid  unremarkable. Lungs/Pleura: Lungs are clear. No focal airspace opacities or suspicious nodules. No effusions. Upper Abdomen: See abdominal CT report Musculoskeletal: Chest wall soft tissues are unremarkable. No acute bony abnormality. IMPRESSION: No acute cardiopulmonary disease. Three vessel coronary artery disease. Aortic Atherosclerosis (ICD10-I70.0). Electronically Signed   By: Franky Crease M.D.   On: 07/29/2023 00:15   CT Head Wo Contrast Result Date: 07/29/2023 CLINICAL DATA:  Memory loss EXAM: CT HEAD WITHOUT CONTRAST TECHNIQUE: Contiguous axial images were obtained from the base  of the skull  through the vertex without intravenous contrast. RADIATION DOSE REDUCTION: This exam was performed according to the departmental dose-optimization program which includes automated exposure control, adjustment of the mA and/or kV according to patient size and/or use of iterative reconstruction technique. COMPARISON:  12/07/2022 FINDINGS: Brain: There is atrophy and chronic small vessel disease changes. No acute intracranial abnormality. Specifically, no hemorrhage, hydrocephalus, mass lesion, acute infarction, or significant intracranial injury. Vascular: No hyperdense vessel or unexpected calcification. Skull: No acute calvarial abnormality. Sinuses/Orbits: No acute findings Other: None IMPRESSION: Atrophy, chronic microvascular disease. No acute intracranial abnormality. Electronically Signed   By: Franky Crease M.D.   On: 07/29/2023 00:14   CT ABDOMEN PELVIS W CONTRAST Result Date: 07/29/2023 CLINICAL DATA:  Weakness, fatigue.  Abdominal pain. EXAM: CT ABDOMEN AND PELVIS WITH CONTRAST TECHNIQUE: Multidetector CT imaging of the abdomen and pelvis was performed using the standard protocol following bolus administration of intravenous contrast. RADIATION DOSE REDUCTION: This exam was performed according to the departmental dose-optimization program which includes automated exposure control, adjustment of the mA and/or kV according to patient size and/or use of iterative reconstruction technique. CONTRAST:  75mL OMNIPAQUE  IOHEXOL  350 MG/ML SOLN COMPARISON:  06/06/2021 FINDINGS: Lower chest: No acute abnormality. Hepatobiliary: Multiple layering gallstones within the gallbladder. Gallbladder is distended with gallbladder wall thickening. Findings concerning for acute cholecystitis. No focal hepatic abnormality. Mild periportal edema. No biliary ductal dilatation. Pancreas: Atrophy of the pancreas. No focal abnormality or ductal dilatation. Spleen: No focal abnormality.  Normal size. Adrenals/Urinary  Tract: Adrenal glands normal. Numerous bilateral nonobstructing renal stones. Bilateral renal cysts are similar prior study. No follow-up imaging recommended. No definite ureteral stones. In the left pelvis posterior to the bladder in the region of the distal left ureter. The ureter is decompressed and difficult to follows/visualized. Given the decompressed state, I favor this is likely an adjacent phlebolith. Stomach/Bowel: Prior Roux-en-Y gastric bypass. Stomach, large and small bowel grossly unremarkable. Vascular/Lymphatic: Aortoiliac atherosclerosis. No evidence of aneurysm or adenopathy. Reproductive: No visible focal abnormality. Other: No free fluid or free air. Musculoskeletal: No acute bony abnormality. IMPRESSION: Cholelithiasis. Gallbladder distension with gallbladder wall thickening and surrounding inflammation concerning for acute cholecystitis. Periportal edema can be seen with volume overload/resuscitation or inflammatory processes such as hepatitis. Bilateral nephrolithiasis.  No hydronephrosis. Prior gastric bypass.  No visible complicating feature. Aortoiliac atherosclerosis. Electronically Signed   By: Franky Crease M.D.   On: 07/29/2023 00:13   DG Ribs Unilateral W/Chest Right Result Date: 07/28/2023 CLINICAL DATA:  Recent fall with right rib pain, initial encounter EXAM: RIGHT RIBS AND CHEST - 3+ VIEW COMPARISON:  07/31/2022 FINDINGS: Cardiac shadow is within normal limits. Aortic calcifications are noted. The lungs are clear bilaterally. No infiltrate or pneumothorax is seen. No acute rib abnormality noted. IMPRESSION: No acute rib fracture seen. Electronically Signed   By: Oneil Devonshire M.D.   On: 07/28/2023 23:39    Review of Systems  Constitutional: Negative.   HENT: Negative.    Eyes: Negative.   Respiratory: Negative.    Cardiovascular: Negative.   Gastrointestinal:  Positive for abdominal pain, diarrhea, nausea and vomiting.  Genitourinary: Negative.   Musculoskeletal:  Negative.   Skin: Negative.   Neurological: Negative.   Endo/Heme/Allergies: Negative.   Psychiatric/Behavioral: Negative.      PE Blood pressure 91/68, pulse 95, temperature 99.1 F (37.3 C), resp. rate 18, SpO2 98%. Constitutional: NAD; conversant; no deformities Eyes: Moist conjunctiva; no lid lag; anicteric; PERRL Neck: Trachea midline; no thyromegaly Lungs: Normal respiratory effort; no  tactile fremitus CV: RRR; no palpable thrills; no pitting edema GI: Abd soft, TTP RUQ; no palpable hepatosplenomegaly MSK: Normal gait; no clubbing/cyanosis Psychiatric: Appropriate affect; alert and oriented x3 Lymphatic: No palpable cervical or axillary lymphadenopathy Skin: No major subcutaneous nodules. Warm and dry   Assessment/Plan: 73 yo male with acute cholecystitis. He took eliquis  yesterday morning.  Recommend medicine admission Clear liquid diet IV abx Fluid resuscitation Once eliquis  has worn off (48 h from last dose) we will plan for lap cholecystectomy vs cholecystostomy  I reviewed last 24 h vitals and pain scores, last 48 h intake and output, last 24 h labs and trends, and last 24 h imaging results.  This care required high  level of medical decision making.   Francisco Padilla Francisco Padilla 07/29/2023, 4:44 AM

## 2023-07-29 NOTE — Progress Notes (Signed)
 I confirmed with patient that his last eliquis dose was 537 am on 07/28/2023.

## 2023-07-29 NOTE — Progress Notes (Signed)
   07/29/23 2045  BiPAP/CPAP/SIPAP  $ Non-Invasive Home Ventilator  Initial  BiPAP/CPAP/SIPAP Pt Type Adult  BiPAP/CPAP/SIPAP Resmed  Mask Type Nasal mask  Mask Size Large  Patient Home Equipment Yes  Auto Titrate No  Safety Check Completed by RT for Home Unit Yes, no issues noted   Pt resting comfortably on home cpap

## 2023-07-29 NOTE — ED Notes (Signed)
 ED TO INPATIENT HANDOFF REPORT  ED Nurse Name and Phone #: Carlyon GLADE RN [phone]  S Name/Age/Gender Francisco Padilla 73 y.o. male Room/Bed: 042C/042C  Code Status   Code Status: Full Code  Home/SNF/Other Home Patient oriented to: self, place, time, and situation Is this baseline? Yes   Triage Complete: Triage complete  Chief Complaint Acute cholecystitis [K81.0]  Triage Note Pt BIB [facility] for generalized weakness/fatigue x 1 month. Pt states he thinks that he has IBS, although he does not have a formal diagnosis and has been eating the BRAT diet x 1 month.    Allergies Allergies  Allergen Reactions   Aspirin     Avoid due to bariatric surgery   Bupropion Other (See Comments)    Passed out   Nsaids     Avoid due to bariatric surgery   Oxycodone  Hcl     Hallucinations, agitation     Level of Care/Admitting Diagnosis ED Disposition     ED Disposition  Admit   Condition  --   Comment  Hospital Area:  [facility] [[id number]]  Level of Care: Telemetry Cardiac [[id number]]  May admit patient to [facility] or [facility] if equivalent level of care is available:: No  Covid Evaluation: Asymptomatic - no recent exposure (last 10 days) testing not required  Diagnosis: Acute cholecystitis [575.0.ICD-9-CM]  Admitting Physician: SUNDIL, SUBRINA [[id number]]  Attending Physician: SUNDIL, SUBRINA [[id number]]  Certification:: I certify this patient will need inpatient services for at least 2 midnights  Expected Medical Readiness: 08/03/2023          B Medical/Surgery History Past Medical History:  Diagnosis Date   ADD (attention deficit disorder)    Anxiety 2007   Asthma as child   Asymptomatic gallstones    Atrial fibrillation (HCC)    Atrial flutter (HCC)    Depression    DJD (degenerative joint disease) of knee    Dysrhythmia    Chronic Atrial Fib- seeing Dr[facility]   H/O peptic ulcer    past history with GI bleed- cauterization done  throught scope   History of kidney stones    pt claims urology discovered a kidney stone last week 05/25/21   Hypercholesterolemia    Orthostatic hypotension    Pneumonia as child   Pneumothorax on left 05/20/2009   Prediabetes    none now   Pulmonary nodules    Sleep apnea    cpap set on 13   Thrombocytopenia (HCC)    pt denies   Thyromegaly    Varicose veins    Past Surgical History:  Procedure Laterality Date   BIOPSY  04/18/2018   Procedure: BIOPSY;  Surgeon: Saintclair Jasper, MD;  Location: [facility];  Service: Gastroenterology;;   ESOPHAGOGASTRODUODENOSCOPY N/A 04/18/2018   Procedure: ESOPHAGOGASTRODUODENOSCOPY (EGD);  Surgeon: Saintclair Jasper, MD;  Location: [facility];  Service: Gastroenterology;  Laterality: N/A;   ESOPHAGOGASTRODUODENOSCOPY (EGD) WITH PROPOFOL  N/A 01/05/2015   Procedure: ESOPHAGOGASTRODUODENOSCOPY (EGD) WITH PROPOFOL ;  Surgeon: Gladis MARLA Louder, MD;  Location: [facility];  Service: Endoscopy;  Laterality: N/A;   EXTRACORPOREAL SHOCK WAVE LITHOTRIPSY Left 08/11/2021   Procedure: LEFT EXTRACORPOREAL SHOCK WAVE LITHOTRIPSY (ESWL);  Surgeon: Selma Donnice SAUNDERS, MD;  Location[facility];  Service: Urology;  Laterality: Left;   GASTRIC ROUX-EN-Y     '08-[name] (weight stable around 302 #   KNEE ARTHROSCOPY     NASAL SEPTOPLASTY W/ TURBINOPLASTY Bilateral 11/13/2022   Procedure: NASAL SEPTOPLASTY WITH BILATERAL TURBINATE REDUCTION;  Surgeon: Karis Clunes, MD;  Location: [facility];  Service: ENT;  Laterality: Bilateral;   TONSILLECTOMY     TOTAL KNEE ARTHROPLASTY Left 06/03/2021   Procedure: LEFT TOTAL KNEE ARTHROPLASTY;  Surgeon: Harden Jerona GAILS, MD;  Location: [facility] OR;  Service: Orthopedics;  Laterality: Left;   ULNAR NERVE TRANSPOSITION Right 15 yrs ago     A IV Location/Drains/Wounds Patient Lines/Drains/Airways Status     Active Line/Drains/Airways     Name Placement date Placement time Site Days   Peripheral IV 07/29/23 20 G  Anterior;Right Forearm 07/29/23  0958  Forearm  less than 1            Intake/Output Last 24 hours  Intake/Output Summary (Last 24 hours) at 07/29/2023 1200 Last data filed at 07/29/2023 0223 Gross per 24 hour  Intake 1000.41 ml  Output --  Net 1000.41 ml    Labs/Imaging Results for orders placed or performed during the hospital encounter of 07/28/23 (from the past 48 hours)  Basic metabolic panel     Status: Abnormal   Collection Time: 07/28/23  9:35 PM  Result Value Ref Range   Sodium 137 135 - 145 mmol/L   Potassium 4.1 3.5 - 5.1 mmol/L   Chloride 100 98 - 111 mmol/L   CO2 27 22 - 32 mmol/L   Glucose, Bld 151 (H) 70 - 99 mg/dL    Comment: Glucose reference range applies only to samples taken after fasting for at least 8 hours.   BUN 68 (H) 8 - 23 mg/dL   Creatinine, Ser 8.48 (H) 0.61 - 1.24 mg/dL   Calcium  10.0 8.9 - 10.3 mg/dL   GFR, Estimated 49 (L) >60 mL/min    Comment: (NOTE) Calculated using the CKD-EPI Creatinine Equation (2021)    Anion gap 10 5 - 15    Comment: Performed at [facility], [address], [place], [place] [address]  CBC     Status: Abnormal   Collection Time: 07/28/23  9:35 PM  Result Value Ref Range   WBC 23.8 (H) 4.0 - 10.5 K/uL   RBC 4.45 4.22 - 5.81 MIL/uL   Hemoglobin 13.8 13.0 - 17.0 g/dL   HCT 58.9 60.9 - 47.9 %   MCV 92.1 80.0 - 100.0 fL   MCH 31.0 26.0 - 34.0 pg   MCHC 33.7 30.0 - 36.0 g/dL   RDW 86.8 88.4 - 84.4 %   Platelets 194 150 - 400 K/uL   nRBC 0.0 0.0 - 0.2 %    Comment: Performed at [facility], [address] [place], [place] [address]  CBG monitoring, ED     Status: Abnormal   Collection Time: 07/28/23 10:05 PM  Result Value Ref Range   Glucose-Capillary 138 (H) 70 - 99 mg/dL    Comment: Glucose reference range applies only to samples taken after fasting for at least 8 hours.  Blood culture (routine x 2)     Status: None (Preliminary result)   Collection Time: 07/29/23 12:13 AM   Specimen: BLOOD RIGHT  HAND  Result Value Ref Range   Specimen Description BLOOD RIGHT HAND    Special Requests      BOTTLES DRAWN AEROBIC AND ANAEROBIC Blood Culture results may not be optimal due to an inadequate volume of blood received in culture bottles   Culture      NO GROWTH < 12 HOURS Performed at [facility], [address] [place], [place] [address]  Report Status PENDING   Blood culture (routine x 2)     Status: None (Preliminary result)   Collection Time: 07/29/23 12:13 AM   Specimen: BLOOD LEFT ARM  Result Value Ref Range   Specimen Description BLOOD LEFT ARM    Special Requests      BOTTLES DRAWN AEROBIC AND ANAEROBIC Blood Culture adequate volume   Culture      NO GROWTH < 12 HOURS Performed at [facility], [address] [place], [place] [address]    Report Status PENDING   Hepatic function panel     Status: Abnormal   Collection Time: 07/29/23 12:14 AM  Result Value Ref Range   Total Protein 6.7 6.5 - 8.1 g/dL   Albumin  3.2 (L) 3.5 - 5.0 g/dL   AST 22 15 - 41 U/L   ALT 17 0 - 44 U/L   Alkaline Phosphatase 63 38 - 126 U/L   Total Bilirubin 0.9 0.0 - 1.2 mg/dL   Bilirubin, Direct 0.3 (H) 0.0 - 0.2 mg/dL   Indirect Bilirubin 0.6 0.3 - 0.9 mg/dL    Comment: Performed at [facility], [address], [place], [place] [address]  Lipase, blood     Status: None   Collection Time: 07/29/23 12:14 AM  Result Value Ref Range   Lipase 29 11 - 51 U/L    Comment: Performed at [facility], [address], [place], [place] [address]  I-Stat CG4 Lactic Acid     Status: None   Collection Time: 07/29/23 12:22 AM  Result Value Ref Range   Lactic Acid, Venous 1.4 0.5 - 1.9 mmol/L  Resp panel by RT-PCR (RSV, Flu A&B, Covid) Anterior Nasal Swab     Status: None   Collection Time: 07/29/23  1:30 AM   Specimen: Anterior Nasal Swab  Result Value Ref Range   SARS Coronavirus 2 by RT PCR NEGATIVE NEGATIVE   Influenza A by PCR NEGATIVE NEGATIVE   Influenza B by PCR  NEGATIVE NEGATIVE    Comment: (NOTE) The Xpert Xpress SARS-CoV-2/FLU/RSV plus assay is intended as an aid in the diagnosis of influenza from Nasopharyngeal swab specimens and should not be used as a sole basis for treatment. Nasal washings and aspirates are unacceptable for Xpert Xpress SARS-CoV-2/FLU/RSV testing.  Fact Sheet for Patients: bloggercourse.com  Fact Sheet for Healthcare Providers: seriousbroker.it  This test is not yet approved or cleared by the [place]  FDA and has been authorized for detection and/or diagnosis of SARS-CoV-2 by FDA under an Emergency Use Authorization (EUA). This EUA will remain in effect (meaning this test can be used) for the duration of the COVID-19 declaration under Section 564(b)(1) of the Act, 21 U.S.C. section 360bbb-3(b)(1), unless the authorization is terminated or revoked.     Resp Syncytial Virus by PCR NEGATIVE NEGATIVE    Comment: (NOTE) Fact Sheet for Patients: bloggercourse.com  Fact Sheet for Healthcare Providers: seriousbroker.it  This test is not yet approved or cleared by the [place]  FDA and has been authorized for detection and/or diagnosis of SARS-CoV-2 by FDA under an Emergency Use Authorization (EUA). This EUA will remain in effect (meaning this test can be used) for the duration of the COVID-19 declaration under Section 564(b)(1) of the Act, 21 U.S.C. section 360bbb-3(b)(1), unless the authorization is terminated or revoked.  Performed at [facility], [address] [place], [place] [address]   Creatinine, urine, random     Status: None   Collection  Time: 07/29/23  4:21 AM  Result Value Ref Range   Creatinine, Urine 105 mg/dL    Comment: Performed at [facility], [address] [place], [place] [address]  Sodium, urine, random     Status: None   Collection Time: 07/29/23  4:21 AM  Result  Value Ref Range   Sodium, Ur <10 mmol/L    Comment: Performed at [facility], [address], [place], [place] [address]  Hepatitis panel, acute     Status: None   Collection Time: 07/29/23  4:59 AM  Result Value Ref Range   Hepatitis B Surface Ag NON REACTIVE NON REACTIVE   HCV Ab NON REACTIVE NON REACTIVE    Comment: (NOTE) Nonreactive HCV antibody screen is consistent with no HCV infections,  unless recent infection is suspected or other evidence exists to indicate HCV infection.     Hep A IgM NON REACTIVE NON REACTIVE   Hep B C IgM NON REACTIVE NON REACTIVE    Comment: Performed at [facility], [address], [place], [place] [address]  CBC     Status: Abnormal   Collection Time: 07/29/23  4:59 AM  Result Value Ref Range   WBC 20.6 (H) 4.0 - 10.5 K/uL   RBC 4.00 (L) 4.22 - 5.81 MIL/uL   Hemoglobin 12.4 (L) 13.0 - 17.0 g/dL   HCT 63.5 (L) 60.9 - 47.9 %   MCV 91.0 80.0 - 100.0 fL   MCH 31.0 26.0 - 34.0 pg   MCHC 34.1 30.0 - 36.0 g/dL   RDW 86.8 88.4 - 84.4 %   Platelets 187 150 - 400 K/uL   nRBC 0.0 0.0 - 0.2 %    Comment: Performed at [facility], [address], [place], [place] [address]  Comprehensive metabolic panel     Status: Abnormal   Collection Time: 07/29/23  4:59 AM  Result Value Ref Range   Sodium 136 135 - 145 mmol/L   Potassium 3.8 3.5 - 5.1 mmol/L   Chloride 102 98 - 111 mmol/L   CO2 23 22 - 32 mmol/L   Glucose, Bld 116 (H) 70 - 99 mg/dL    Comment: Glucose reference range applies only to samples taken after fasting for at least 8 hours.   BUN 56 (H) 8 - 23 mg/dL   Creatinine, Ser 8.89 0.61 - 1.24 mg/dL   Calcium  9.3 8.9 - 10.3 mg/dL   Total Protein 5.9 (L) 6.5 - 8.1 g/dL   Albumin  2.9 (L) 3.5 - 5.0 g/dL   AST 19 15 - 41 U/L   ALT 15 0 - 44 U/L   Alkaline Phosphatase 58 38 - 126 U/L   Total Bilirubin 0.8 0.0 - 1.2 mg/dL   GFR, Estimated >39 >39 mL/min    Comment: (NOTE) Calculated using the CKD-EPI Creatinine Equation (2021)     Anion gap 11 5 - 15    Comment: Performed at [facility], [address] [place], [place] [address]   CT Chest W Contrast Result Date: 07/29/2023 CLINICAL DATA:  Sepsis, weakness, fatigue. EXAM: CT CHEST WITH CONTRAST TECHNIQUE: Multidetector CT imaging of the chest was performed during intravenous contrast administration. RADIATION DOSE REDUCTION: This exam was performed according to the departmental dose-optimization program which includes automated exposure control, adjustment of the mA and/or kV according to patient size and/or use of iterative reconstruction technique. CONTRAST:  75mL OMNIPAQUE  IOHEXOL  350 MG/ML SOLN COMPARISON:  01/20/2011 FINDINGS: Cardiovascular: Heart  is normal size. Aorta is normal caliber. Diffuse 3 vessel coronary artery disease. Aortic atherosclerosis. Mediastinum/Nodes: No mediastinal, hilar, or axillary adenopathy. Trachea and esophagus are unremarkable. Thyroid  unremarkable. Lungs/Pleura: Lungs are clear. No focal airspace opacities or suspicious nodules. No effusions. Upper Abdomen: See abdominal CT report Musculoskeletal: Chest wall soft tissues are unremarkable. No acute bony abnormality. IMPRESSION: No acute cardiopulmonary disease. Three vessel coronary artery disease. Aortic Atherosclerosis (ICD10-I70.0). Electronically Signed   By: Franky Crease M.D.   On: 07/29/2023 00:15   CT Head Wo Contrast Result Date: 07/29/2023 CLINICAL DATA:  Memory loss EXAM: CT HEAD WITHOUT CONTRAST TECHNIQUE: Contiguous axial images were obtained from the base of the skull through the vertex without intravenous contrast. RADIATION DOSE REDUCTION: This exam was performed according to the departmental dose-optimization program which includes automated exposure control, adjustment of the mA and/or kV according to patient size and/or use of iterative reconstruction technique. COMPARISON:  12/07/2022 FINDINGS: Brain: There is atrophy and chronic small vessel disease changes. No acute  intracranial abnormality. Specifically, no hemorrhage, hydrocephalus, mass lesion, acute infarction, or significant intracranial injury. Vascular: No hyperdense vessel or unexpected calcification. Skull: No acute calvarial abnormality. Sinuses/Orbits: No acute findings Other: None IMPRESSION: Atrophy, chronic microvascular disease. No acute intracranial abnormality. Electronically Signed   By: Franky Crease M.D.   On: 07/29/2023 00:14   CT ABDOMEN PELVIS W CONTRAST Result Date: 07/29/2023 CLINICAL DATA:  Weakness, fatigue.  Abdominal pain. EXAM: CT ABDOMEN AND PELVIS WITH CONTRAST TECHNIQUE: Multidetector CT imaging of the abdomen and pelvis was performed using the standard protocol following bolus administration of intravenous contrast. RADIATION DOSE REDUCTION: This exam was performed according to the departmental dose-optimization program which includes automated exposure control, adjustment of the mA and/or kV according to patient size and/or use of iterative reconstruction technique. CONTRAST:  75mL OMNIPAQUE  IOHEXOL  350 MG/ML SOLN COMPARISON:  06/06/2021 FINDINGS: Lower chest: No acute abnormality. Hepatobiliary: Multiple layering gallstones within the gallbladder. Gallbladder is distended with gallbladder wall thickening. Findings concerning for acute cholecystitis. No focal hepatic abnormality. Mild periportal edema. No biliary ductal dilatation. Pancreas: Atrophy of the pancreas. No focal abnormality or ductal dilatation. Spleen: No focal abnormality.  Normal size. Adrenals/Urinary Tract: Adrenal glands normal. Numerous bilateral nonobstructing renal stones. Bilateral renal cysts are similar prior study. No follow-up imaging recommended. No definite ureteral stones. In the left pelvis posterior to the bladder in the region of the distal left ureter. The ureter is decompressed and difficult to follows/visualized. Given the decompressed state, I favor this is likely an adjacent phlebolith. Stomach/Bowel:  Prior Roux-en-Y gastric bypass. Stomach, large and small bowel grossly unremarkable. Vascular/Lymphatic: Aortoiliac atherosclerosis. No evidence of aneurysm or adenopathy. Reproductive: No visible focal abnormality. Other: No free fluid or free air. Musculoskeletal: No acute bony abnormality. IMPRESSION: Cholelithiasis. Gallbladder distension with gallbladder wall thickening and surrounding inflammation concerning for acute cholecystitis. Periportal edema can be seen with volume overload/resuscitation or inflammatory processes such as hepatitis. Bilateral nephrolithiasis.  No hydronephrosis. Prior gastric bypass.  No visible complicating feature. Aortoiliac atherosclerosis. Electronically Signed   By: Franky Crease M.D.   On: 07/29/2023 00:13   DG Ribs Unilateral W/Chest Right Result Date: 07/28/2023 CLINICAL DATA:  Recent fall with right rib pain, initial encounter EXAM: RIGHT RIBS AND CHEST - 3+ VIEW COMPARISON:  07/31/2022 FINDINGS: Cardiac shadow is within normal limits. Aortic calcifications are noted. The lungs are clear bilaterally. No infiltrate or pneumothorax is seen. No acute rib abnormality noted. IMPRESSION: No acute rib fracture seen. Electronically Signed  By: Oneil Devonshire M.D.   On: 07/28/2023 23:39    Pending Labs Unresulted Labs (From admission, onward)     Start     Ordered   07/28/23 2123  Urinalysis, Routine w reflex microscopic -Urine, Clean Catch  Once,   URGENT       Question:  Specimen Source  Answer:  Urine, Clean Catch   07/28/23 2122            Vitals/Pain Today's Vitals   07/29/23 0900 07/29/23 1000 07/29/23 1022 07/29/23 1042  BP: 95/78 101/75    Pulse: 100 90    Resp: (!) 21 19    Temp:    (!) 97.4 F (36.3 C)  TempSrc:    Oral  SpO2: 100% 99%    PainSc:   0-No pain     Isolation Precautions No active isolations  Medications Medications  venlafaxine  XR (EFFEXOR -XR) 24 hr capsule 37.5 mg (37.5 mg Oral Not Given 07/29/23 0842)  LORazepam  (ATIVAN )  tablet 1 mg (has no administration in time range)  pantoprazole  (PROTONIX ) EC tablet 40 mg (40 mg Oral Given 07/29/23 0843)  sodium chloride  flush (NS) 0.9 % injection 3 mL (0 mLs Intravenous Hold 07/29/23 0825)  0.9 %  sodium chloride  infusion ( Intravenous New Bag/Given 07/29/23 0957)  sodium chloride  flush (NS) 0.9 % injection 3 mL (0 mLs Intravenous Hold 07/29/23 0825)  sodium chloride  flush (NS) 0.9 % injection 3 mL (has no administration in time range)  0.9 %  sodium chloride  infusion (has no administration in time range)  acetaminophen  (TYLENOL ) tablet 650 mg (has no administration in time range)    Or  acetaminophen  (TYLENOL ) suppository 650 mg (has no administration in time range)  senna-docusate (Senokot-S) tablet 1 tablet (has no administration in time range)  fludrocortisone  (FLORINEF ) tablet 0.1 mg (0.1 mg Oral Given 07/29/23 0412)  piperacillin -tazobactam (ZOSYN ) IVPB 3.375 g (3.375 g Intravenous New Bag/Given 07/29/23 0837)  zolpidem  (AMBIEN ) tablet 5 mg (has no administration in time range)  sodium chloride  0.9 % bolus 1,000 mL (0 mLs Intravenous Stopped 07/29/23 0223)  iohexol  (OMNIPAQUE ) 350 MG/ML injection 75 mL (75 mLs Intravenous Contrast Given 07/28/23 2352)  piperacillin -tazobactam (ZOSYN ) IVPB 3.375 g (0 g Intravenous Stopped 07/29/23 0147)    Mobility walks      R Recommendations: See Admitting Provider Note  Report given to: 3E03

## 2023-07-29 NOTE — Progress Notes (Signed)
 Patient has inconsistent history of Eliquis  use.  To me patient reported last took on 07/26/23 morning and to general surgery he stated 07/28/23 morning.  General surgery Dr. Stevie recommended off Eliquis  for 48-hour afterward  will plan for lap cholecystectomy versus cholecystectomy.  Will continue clear liquid diet.

## 2023-07-29 NOTE — Progress Notes (Signed)
 [facility] PROGRESS NOTE  Francisco Padilla (DOB: 12-23-1950) FMW:[id number] PCP: Sherlynn Madden, MD  Brief Narrative: Francisco Padilla is a 73 y.o. 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   Subjective: Abdominal discomfort is stable, no new complaints from admission. He had septoplasty and had several days of bleeding after that so he is worried about having bleeding with any procedure here. Amenable to holding eliquis .   Objective: BP 100/70   Pulse 93   Temp (!) 97.4 F (36.3 C) (Oral)   Resp 20   SpO2 100%   Gen: Tall male in no distress Pulm: Clear, nonlabored  CV: Irreg irreg, no MRG or edema GI: Soft, +RUQ tenderness without rebound or guarding, +BS Neuro: Alert and oriented. No new focal deficits. Ext: Warm, no deformities. Skin: No rashes, lesions or ulcers on visualized skin   Assessment & Plan: Sepsis due to acute cholecystitis: Hepatitis panel negative.  - Continue zosyn  - Follow up blood cultures - Surgery following, allowing eliquis  washout prior to lap cholecystectomy vs. cholecystostomy.  - Continue pain control.  - Defer diet to surgery (currently CLD)  AKI: Improving Cr 1.51 > 1.1. FENa is low. - Continue CLD, avoid hypotension.  - Baseline Cr still lower, will complete this bag of IVF, then can stop.   PAF: Afib on admission, remains rate controlled.  - Monitor tele - Holding eliquis   Insomnia:  - No changes to home regimen  Peripheral neuropathy:  - Continue gabapentin   Abnormal hepatic appearance by CT: Hepatitis panel negative - Hold statin  Chronic hypotension:  - Continue florinef    Francisco KATHEE Come, MD [facility] [email or web address] 07/29/2023, 1:14 PM

## 2023-07-29 NOTE — Anesthesia Preprocedure Evaluation (Deleted)
 Anesthesia Evaluation    Airway        Dental   Pulmonary asthma , sleep apnea           Cardiovascular + CAD  + dysrhythmias Atrial Fibrillation      Neuro/Psych   Anxiety Depression    negative neurological ROS     GI/Hepatic PUD,GERD  Medicated,,(+) Hepatitis -Cholecystitis    Endo/Other  negative endocrine ROS    Renal/GU      Musculoskeletal  (+) Arthritis , Osteoarthritis,    Abdominal   Peds  Hematology Lab Results      Component                Value               Date                      WBC                      20.6 (H)            07/29/2023                HGB                      12.4 (L)            07/29/2023                HCT                      36.4 (L)            07/29/2023                MCV                      91.0                07/29/2023                PLT                      187                 07/29/2023              Anesthesia Other Findings   Reproductive/Obstetrics                             Anesthesia Physical Anesthesia Plan  ASA: 3  Anesthesia Plan: General   Post-op Pain Management: Tylenol  PO (pre-op)*   Induction: Intravenous  PONV Risk Score and Plan: 2 and Ondansetron , Dexamethasone  and Treatment may vary due to age or medical condition  Airway Management Planned: Mask and Oral ETT  Additional Equipment: None  Intra-op Plan:   Post-operative Plan: Extubation in OR  Informed Consent:   Plan Discussed with:   Anesthesia Plan Comments:        Anesthesia Quick Evaluation

## 2023-07-30 ENCOUNTER — Inpatient Hospital Stay (HOSPITAL_COMMUNITY): Payer: Self-pay | Admitting: Anesthesiology

## 2023-07-30 ENCOUNTER — Encounter (HOSPITAL_COMMUNITY)
Admission: EM | Disposition: A | Discharge status: Skilled Nursing Facility | Payer: Self-pay | Source: Home / Self Care | Attending: Internal Medicine

## 2023-07-30 ENCOUNTER — Telehealth: Payer: Self-pay | Admitting: Cardiology

## 2023-07-30 DIAGNOSIS — Z7901 Long term (current) use of anticoagulants: Secondary | ICD-10-CM

## 2023-07-30 DIAGNOSIS — Z01810 Encounter for preprocedural cardiovascular examination: Secondary | ICD-10-CM

## 2023-07-30 DIAGNOSIS — I4821 Permanent atrial fibrillation: Secondary | ICD-10-CM

## 2023-07-30 DIAGNOSIS — K810 Acute cholecystitis: Secondary | ICD-10-CM | POA: Diagnosis not present

## 2023-07-30 LAB — COMPREHENSIVE METABOLIC PANEL
ALT: 23 U/L (ref 0–44)
AST: 27 U/L (ref 15–41)
Albumin: 2.6 g/dL — ABNORMAL LOW (ref 3.5–5.0)
Alkaline Phosphatase: 70 U/L (ref 38–126)
Anion gap: 10 (ref 5–15)
BUN: 30 mg/dL — ABNORMAL HIGH (ref 8–23)
CO2: 21 mmol/L — ABNORMAL LOW (ref 22–32)
Calcium: 9 mg/dL (ref 8.9–10.3)
Chloride: 104 mmol/L (ref 98–111)
Creatinine, Ser: 0.74 mg/dL (ref 0.61–1.24)
GFR, Estimated: >60 mL/min (ref >60)
Glucose, Bld: 109 mg/dL — ABNORMAL HIGH (ref 70–99)
Potassium: 3.4 mmol/L — ABNORMAL LOW (ref 3.5–5.1)
Sodium: 135 mmol/L (ref 135–145)
Total Bilirubin: 0.9 mg/dL (ref 0.0–1.2)
Total Protein: 5.6 g/dL — ABNORMAL LOW (ref 6.5–8.1)

## 2023-07-30 LAB — CBC
HCT: 36.1 % — ABNORMAL LOW (ref 39.0–52.0)
Hemoglobin: 12 g/dL — ABNORMAL LOW (ref 13.0–17.0)
MCH: 30.7 pg (ref 26.0–34.0)
MCHC: 33.2 g/dL (ref 30.0–36.0)
MCV: 92.3 fL (ref 80.0–100.0)
Platelets: 180 10*3/uL (ref 150–400)
RBC: 3.91 MIL/uL — ABNORMAL LOW (ref 4.22–5.81)
RDW: 13.2 % (ref 11.5–15.5)
WBC: 19.1 10*3/uL — ABNORMAL HIGH (ref 4.0–10.5)
nRBC: 0 % (ref 0.0–0.2)

## 2023-07-30 SURGERY — LAPAROSCOPIC CHOLECYSTECTOMY
Anesthesia: Choice

## 2023-07-30 MED ORDER — ROCURONIUM BROMIDE 10 MG/ML (PF) SYRINGE
PREFILLED_SYRINGE | INTRAVENOUS | Status: AC
  Filled 2023-07-30: qty 10

## 2023-07-30 MED ORDER — MIDAZOLAM HCL 2 MG/2ML IJ SOLN
INTRAMUSCULAR | Status: AC
  Filled 2023-07-30: qty 2

## 2023-07-30 MED ORDER — LIDOCAINE 2% (20 MG/ML) 5 ML SYRINGE
INTRAMUSCULAR | Status: AC
  Filled 2023-07-30: qty 5

## 2023-07-30 MED ORDER — PROPOFOL 10 MG/ML IV BOLUS
INTRAVENOUS | Status: AC
  Filled 2023-07-30: qty 20

## 2023-07-30 MED ORDER — POTASSIUM CHLORIDE CRYS ER 20 MEQ PO TBCR
40.0000 meq | EXTENDED_RELEASE_TABLET | Freq: Once | ORAL | Status: AC
  Administered 2023-07-30: 40 meq via ORAL
  Filled 2023-07-30: qty 2

## 2023-07-30 MED ORDER — CHLORHEXIDINE GLUCONATE 0.12 % MT SOLN
OROMUCOSAL | Status: AC
  Filled 2023-07-30: qty 15

## 2023-07-30 MED ORDER — HYDROMORPHONE HCL 1 MG/ML IJ SOLN
INTRAMUSCULAR | Status: AC
  Filled 2023-07-30: qty 0.5

## 2023-07-30 NOTE — Plan of Care (Signed)

## 2023-07-30 NOTE — Progress Notes (Signed)
 Pt has home CPAP and will place himself tonight. Will call if help is needed.

## 2023-07-30 NOTE — Plan of Care (Signed)
  Problem: Coping: Goal: Level of anxiety will decrease Outcome: Progressing   Problem: Pain Management: Goal: General experience of comfort will improve Outcome: Progressing

## 2023-07-30 NOTE — Progress Notes (Signed)
 * Day of Surgery *  Subjective: Endorsing persistent right-sided pain.  He is anxious about the idea of a surgery or cholecystostomy tube. He wants his cardiologist to manage his Eliquis . He is not interested in surgery at this time.   ROS: See above, otherwise other systems negative  Objective: Vital signs in last 24 hours: Temp:  [97.4 F (36.3 C)-99.5 F (37.5 C)] 98.2 F (36.8 C) (01/06 0719) Pulse Rate:  [90-115] 93 (01/06 0719) Resp:  [17-22] 17 (01/06 0719) BP: (86-107)/(60-83) 101/76 (01/06 0719) SpO2:  [97 %-100 %] 97 % (01/06 0719) Weight:  [90.4 kg-92.8 kg] 92.8 kg (01/06 0421) Last BM Date : 07/29/23  Intake/Output from previous day: 01/05 0701 - 01/06 0700 In: 3090.7 [P.O.:840; I.V.:2150.7; IV Piggyback:100] Out: 200 [Urine:200] Intake/Output this shift: No intake/output data recorded.  PE: Gen: male, NAD Abd: soft, non-distended, TTP in the RUQ, + guarding, no peritoneal signs  Lab Results:  Recent Labs    07/29/23 0459 07/30/23 0225  WBC 20.6* 19.1*  HGB 12.4* 12.0*  HCT 36.4* 36.1*  PLT 187 180   BMET Recent Labs    07/29/23 0459 07/30/23 0225  NA 136 135  K 3.8 3.4*  CL 102 104  CO2 23 21*  GLUCOSE 116* 109*  BUN 56* 30*  CREATININE 1.10 0.74  CALCIUM  9.3 9.0   PT/INR No results for input(s): LABPROT, INR in the last 72 hours. CMP     Component Value Date/Time   NA 135 07/30/2023 0225   K 3.4 (L) 07/30/2023 0225   CL 104 07/30/2023 0225   CO2 21 (L) 07/30/2023 0225   GLUCOSE 109 (H) 07/30/2023 0225   BUN 30 (H) 07/30/2023 0225   CREATININE 0.74 07/30/2023 0225   CREATININE 0.77 07/19/2023 1128   CALCIUM  9.0 07/30/2023 0225   PROT 5.6 (L) 07/30/2023 0225   ALBUMIN  2.6 (L) 07/30/2023 0225   AST 27 07/30/2023 0225   ALT 23 07/30/2023 0225   ALKPHOS 70 07/30/2023 0225   BILITOT 0.9 07/30/2023 0225   GFRNONAA >60 07/30/2023 0225   GFRAA  10/23/2009 0135    >60        The eGFR has been calculated using the MDRD  equation. This calculation has not been validated in all clinical situations. eGFR's persistently <60 mL/min signify possible Chronic Kidney Disease.   Lipase     Component Value Date/Time   LIPASE 29 07/29/2023 0014    Studies/Results: CT Chest W Contrast Result Date: 07/29/2023 CLINICAL DATA:  Sepsis, weakness, fatigue. EXAM: CT CHEST WITH CONTRAST TECHNIQUE: Multidetector CT imaging of the chest was performed during intravenous contrast administration. RADIATION DOSE REDUCTION: This exam was performed according to the departmental dose-optimization program which includes automated exposure control, adjustment of the mA and/or kV according to patient size and/or use of iterative reconstruction technique. CONTRAST:  75mL OMNIPAQUE  IOHEXOL  350 MG/ML SOLN COMPARISON:  01/20/2011 FINDINGS: Cardiovascular: Heart is normal size. Aorta is normal caliber. Diffuse 3 vessel coronary artery disease. Aortic atherosclerosis. Mediastinum/Nodes: No mediastinal, hilar, or axillary adenopathy. Trachea and esophagus are unremarkable. Thyroid  unremarkable. Lungs/Pleura: Lungs are clear. No focal airspace opacities or suspicious nodules. No effusions. Upper Abdomen: See abdominal CT report Musculoskeletal: Chest wall soft tissues are unremarkable. No acute bony abnormality. IMPRESSION: No acute cardiopulmonary disease. Three vessel coronary artery disease. Aortic Atherosclerosis (ICD10-I70.0). Electronically Signed   By: Franky Crease M.D.   On: 07/29/2023 00:15   CT Head Wo Contrast Result Date: 07/29/2023 CLINICAL DATA:  Memory  loss EXAM: CT HEAD WITHOUT CONTRAST TECHNIQUE: Contiguous axial images were obtained from the base of the skull through the vertex without intravenous contrast. RADIATION DOSE REDUCTION: This exam was performed according to the departmental dose-optimization program which includes automated exposure control, adjustment of the mA and/or kV according to patient size and/or use of iterative  reconstruction technique. COMPARISON:  12/07/2022 FINDINGS: Brain: There is atrophy and chronic small vessel disease changes. No acute intracranial abnormality. Specifically, no hemorrhage, hydrocephalus, mass lesion, acute infarction, or significant intracranial injury. Vascular: No hyperdense vessel or unexpected calcification. Skull: No acute calvarial abnormality. Sinuses/Orbits: No acute findings Other: None IMPRESSION: Atrophy, chronic microvascular disease. No acute intracranial abnormality. Electronically Signed   By: Franky Crease M.D.   On: 07/29/2023 00:14   CT ABDOMEN PELVIS W CONTRAST Result Date: 07/29/2023 CLINICAL DATA:  Weakness, fatigue.  Abdominal pain. EXAM: CT ABDOMEN AND PELVIS WITH CONTRAST TECHNIQUE: Multidetector CT imaging of the abdomen and pelvis was performed using the standard protocol following bolus administration of intravenous contrast. RADIATION DOSE REDUCTION: This exam was performed according to the departmental dose-optimization program which includes automated exposure control, adjustment of the mA and/or kV according to patient size and/or use of iterative reconstruction technique. CONTRAST:  75mL OMNIPAQUE  IOHEXOL  350 MG/ML SOLN COMPARISON:  06/06/2021 FINDINGS: Lower chest: No acute abnormality. Hepatobiliary: Multiple layering gallstones within the gallbladder. Gallbladder is distended with gallbladder wall thickening. Findings concerning for acute cholecystitis. No focal hepatic abnormality. Mild periportal edema. No biliary ductal dilatation. Pancreas: Atrophy of the pancreas. No focal abnormality or ductal dilatation. Spleen: No focal abnormality.  Normal size. Adrenals/Urinary Tract: Adrenal glands normal. Numerous bilateral nonobstructing renal stones. Bilateral renal cysts are similar prior study. No follow-up imaging recommended. No definite ureteral stones. In the left pelvis posterior to the bladder in the region of the distal left ureter. The ureter is  decompressed and difficult to follows/visualized. Given the decompressed state, I favor this is likely an adjacent phlebolith. Stomach/Bowel: Prior Roux-en-Y gastric bypass. Stomach, large and small bowel grossly unremarkable. Vascular/Lymphatic: Aortoiliac atherosclerosis. No evidence of aneurysm or adenopathy. Reproductive: No visible focal abnormality. Other: No free fluid or free air. Musculoskeletal: No acute bony abnormality. IMPRESSION: Cholelithiasis. Gallbladder distension with gallbladder wall thickening and surrounding inflammation concerning for acute cholecystitis. Periportal edema can be seen with volume overload/resuscitation or inflammatory processes such as hepatitis. Bilateral nephrolithiasis.  No hydronephrosis. Prior gastric bypass.  No visible complicating feature. Aortoiliac atherosclerosis. Electronically Signed   By: Franky Crease M.D.   On: 07/29/2023 00:13   DG Ribs Unilateral W/Chest Right Result Date: 07/28/2023 CLINICAL DATA:  Recent fall with right rib pain, initial encounter EXAM: RIGHT RIBS AND CHEST - 3+ VIEW COMPARISON:  07/31/2022 FINDINGS: Cardiac shadow is within normal limits. Aortic calcifications are noted. The lungs are clear bilaterally. No infiltrate or pneumothorax is seen. No acute rib abnormality noted. IMPRESSION: No acute rib fracture seen. Electronically Signed   By: Oneil Devonshire M.D.   On: 07/28/2023 23:39    Anti-infectives: Anti-infectives (From admission, onward)    Start     Dose/Rate Route Frequency Ordered Stop   07/29/23 0800  piperacillin -tazobactam (ZOSYN ) IVPB 3.375 g        3.375 g 12.5 mL/hr over 240 Minutes Intravenous Every 8 hours 07/29/23 0207     07/29/23 0030  piperacillin -tazobactam (ZOSYN ) IVPB 3.375 g        3.375 g 100 mL/hr over 30 Minutes Intravenous  Once 07/29/23 0020 07/29/23 0147  Assessment/Plan 73 y/o M w/ a hx of OSA, PAF on Eliquis , PVD, HLD, GERD, BPH, and RnY-GB who presented to the ED w/ abdominal pain and  has evidence of cholecystitis   - We had an extensive conversation this morning regarding gallbladder disease and the proposed treatment.  He is anxious about surgery and would like to talk to his cardiologist about his anticoagulation.  In addition, he is concerned about being able to take care of himself post-op.   - I am going to cancel today's surgery but will remain available to address his questions. Will likely require cholecystostomy tube but final decision pending conversation with cardiology and the patient.  - Okay for a diet today. NPO at MN.  - Continue abx  This care required moderate level of medical decision making.    LOS: 1 day   Cordella DELENA Polly Marlis [facility] Surgery 07/30/2023, 7:41 AM Please see Amion for pager number during day hours 7:00am-4:30pm or 7:00am -11:30am on weekends

## 2023-07-30 NOTE — Consult Note (Addendum)
 Cardiology Consultation   Patient ID: [facility] MRN: [id number]; DOB: 25-Nov-1950  Admit date: 07/28/2023 Date of Consult: 07/30/2023  PCP:  Francisco Madden, MD   [facility] Providers Cardiologist:  Francisco Parchment, MD   {  Patient Profile:   Francisco [facility] is a 73 y.o. male with a hx of permanent atrial fibrillation, orthostatic hypotension, OSA on CPAP, PVD, hyperlipidemia, GERD, BPH, Roux-en-Y gastric bypass 2008, GI bleed, who is being seen 07/30/2023 for the evaluation of anticoagulation with upcoming cholecystectomy at the request of Dr. Bryn.  History of Present Illness:   Mr. [facility] Has history of atrial fibrillation that is permanent currently on rate control strategy and anticoagulation for thromboembolic prophylaxis.  He also has chronic orthostatic hypotension has been maintained on low-dose fludrocortisone .  Patient has not had a echocardiogram since 2016 based on our records however at the time reported to have preserved biventricular function with severely dilated LA.  He has outpatient scheduled echocardiogram.  Additionally reportedly had a near syncopal event while driving and had a 2-week heart monitor that he just mailed back.  Currently patient being evaluated for acute cholecystitis/sepsis.  Today he was scheduled for laparoscopic cholecystectomy; however, he has been very anxious about this procedure with concerns of bleeding complications with given his prior history.  He personally requested cardiology to come see to discuss his anticoagulation.  He is rate controlled with heart rates less than 110.  His Eliquis  has been held for anticipation of procedure.  Overall patient has minimal complaints and generally asymptomatic from his atrial fibrillation.  Denies any palpitations, shortness of breath, chest pain significant peripheral edema, orthopnea, dizziness, syncope.  He is very active at home and frequently rides his bike.  He reports a bleeding  complication after he had a nasal septum surgery (states he restarted AC too soon). He is also anxious because he lives at home alone now with recent passing of his husband.  Would like to be out of facility afterwards.      Past Medical History:  Diagnosis Date   ADD (attention deficit disorder)    Anxiety 2007   Asthma as child   Asymptomatic gallstones    Atrial fibrillation (HCC)    Atrial flutter (HCC)    Depression    DJD (degenerative joint disease) of knee    Dysrhythmia    Chronic Atrial Fib- seeing Dr. DOROTHA [facility]   H/O peptic ulcer    past history with GI bleed- cauterization done throught scope   History of kidney stones    pt claims urology discovered a kidney stone last week 05/25/21   Hypercholesterolemia    Orthostatic hypotension    Pneumonia as child   Pneumothorax on left 05/20/2009   Prediabetes    none now   Pulmonary nodules    Sleep apnea    cpap set on 13   Thrombocytopenia (HCC)    pt denies   Thyromegaly    Varicose veins     Past Surgical History:  Procedure Laterality Date   BIOPSY  04/18/2018   Procedure: BIOPSY;  Surgeon: Francisco Jasper, MD;  Location: [facility];  Service: Gastroenterology;;   ESOPHAGOGASTRODUODENOSCOPY N/A 04/18/2018   Procedure: ESOPHAGOGASTRODUODENOSCOPY (EGD);  Surgeon: Francisco Jasper, MD;  Location: [facility];  Service: Gastroenterology;  Laterality: N/A;   ESOPHAGOGASTRODUODENOSCOPY (EGD) WITH PROPOFOL  N/A 01/05/2015   Procedure: ESOPHAGOGASTRODUODENOSCOPY (EGD) WITH PROPOFOL ;  Surgeon: Francisco [facility] Louder, MD;  Location: [facility];  Service: [facility];  Laterality: N/A;  EXTRACORPOREAL SHOCK WAVE LITHOTRIPSY Left 08/11/2021   Procedure: LEFT EXTRACORPOREAL SHOCK WAVE LITHOTRIPSY (ESWL);  Surgeon: Francisco [facility] SAUNDERS, MD;  Location: [facility];  Service: Urology;  Laterality: Left;   GASTRIC ROUX-EN-Y     '08-[name] (weight stable around 302 #   KNEE ARTHROSCOPY     NASAL SEPTOPLASTY W/  TURBINOPLASTY Bilateral 11/13/2022   Procedure: NASAL SEPTOPLASTY WITH BILATERAL TURBINATE REDUCTION;  Surgeon: Francisco Clunes, MD;  Location: [facility];  Service: ENT;  Laterality: Bilateral;   TONSILLECTOMY     TOTAL KNEE ARTHROPLASTY Left 06/03/2021   Procedure: LEFT TOTAL KNEE ARTHROPLASTY;  Surgeon: Francisco [facility] GAILS, MD;  [facility];  Service: Orthopedics;  Laterality: Left;   ULNAR NERVE TRANSPOSITION Right 15 yrs ago     Inpatient Medications: Scheduled Meds:  acidophilus  1 capsule Oral Daily   alfuzosin   10 mg Oral Q breakfast   chlorhexidine        fludrocortisone   0.1 mg Oral Daily   gabapentin   600 mg Oral QHS   latanoprost   1 drop Both Eyes QHS   multivitamin  1 tablet Oral Daily   pantoprazole   40 mg Oral Daily   sodium chloride  flush  3 mL Intravenous Q12H   venlafaxine  XR  37.5 mg Oral Q breakfast   Continuous Infusions:  piperacillin -tazobactam (ZOSYN )  IV 3.375 g (07/30/23 0919)   PRN Meds: acetaminophen  **OR** acetaminophen , chlorhexidine , LORazepam , senna-docusate, traMADol , zolpidem   Allergies:    Allergies  Allergen Reactions   Aspirin     Avoid due to bariatric surgery   Bupropion Other (See Comments)    Passed out   Nsaids     Avoid due to bariatric surgery   Oxycodone  Hcl     Hallucinations, agitation     Social History:   Social History   Socioeconomic History   Marital status: Single    Spouse name: Not on file   Number of children: Not on file   Years of education: Not on file   Highest education level: Not on file  Occupational History   Not on file  Tobacco Use   Smoking status: Never   Smokeless tobacco: Never  Vaping Use   Vaping status: Never Used  Substance and Sexual Activity   Alcohol use: No    Alcohol/week: 0.0 standard drinks of alcohol   Drug use: Never   Sexual activity: Yes  Other Topics Concern   Not on file  Social History Narrative   Not on file   Social Drivers of Health   Financial  Resource Strain: Not on file  Food Insecurity: No Food Insecurity (07/29/2023)   Hunger Vital Sign    Worried About Running Out of Food in the Last Year: Never true    Ran Out of Food in the Last Year: Never true  Transportation Needs: No Transportation Needs (07/29/2023)   PRAPARE - Administrator, Civil Service (Medical): No    Lack of Transportation (Non-Medical): No  Physical Activity: Not on file  Stress: Not on file  Social Connections: Unknown (07/07/2022)   Received from [facility]   Social Network    Social Network: Not on file  Intimate Partner Violence: Not At Risk (07/29/2023)   Humiliation, Afraid, Rape, and Kick questionnaire    Fear of Current or Ex-Partner: No    Emotionally Abused: No    Physically Abused: No    Sexually Abused: No    Family History:  Family History  Problem Relation Age of Onset   Heart disease Mother    Heart failure Mother    Heart disease Father    Heart attack Father    Stroke Neg Hx      ROS:  Please see the history of present illness.  All other ROS reviewed and negative.     Physical Exam/Data:   Vitals:   07/30/23 0007 07/30/23 0421 07/30/23 0719 07/30/23 1056  BP: 96/65 100/71 101/76 92/71  Pulse: (!) 115 92 93 89  Resp:  18 17 19   Temp: 98.3 F (36.8 C) 99.5 F (37.5 C) 98.2 F (36.8 C) 98.5 F (36.9 C)  TempSrc: Oral Axillary Oral Oral  SpO2:  100% 97% 100%  Weight:  92.8 kg    Height:        Intake/Output Summary (Last 24 hours) at 07/30/2023 1507 Last data filed at 07/30/2023 1058 Gross per 24 hour  Intake 4167.68 ml  Output 1200 ml  Net 2967.68 ml      07/30/2023    4:21 AM 07/29/2023    2:08 PM 07/19/2023   10:15 AM  Last 3 Weights  Weight (lbs) 204 lb 9.4 oz 199 lb 4.7 oz 203 lb 12.8 oz  Weight (kg) 92.8 kg 90.4 kg 92.443 kg     Body mass index is 24.9 kg/m.  General:  Well nourished, well developed, in no acute distress HEENT: normal Neck: no JVD Vascular: No carotid  bruits; Distal pulses 2+ bilaterally Cardiac: Irregularly irregular, 2/6 murmur Lungs:  clear to auscultation bilaterally, no wheezing, rhonchi or rales  Abd: soft, nontender, no hepatomegaly  Ext: no edema Musculoskeletal:  No deformities, BUE and BLE strength normal and equal Skin: warm and dry  Neuro:  CNs 2-12 intact, no focal abnormalities noted Psych:  Normal affect   EKG:  The EKG was personally reviewed and demonstrates:, Atrial fibrillation heart rate 102.  No acute ST-T changes Telemetry:  Telemetry was personally reviewed and demonstrates: Atrial fibrillation heart rates generally between 90-110.  Relevant CV Studies: No recent studies   Laboratory Data:  High Sensitivity Troponin:  No results for input(s): TROPONINIHS in the last 720 hours.   Chemistry Recent Labs  Lab 07/28/23 2135 07/29/23 0459 07/30/23 0225  NA 137 136 135  K 4.1 3.8 3.4*  CL 100 102 104  CO2 27 23 21*  GLUCOSE 151* 116* 109*  BUN 68* 56* 30*  CREATININE 1.51* 1.10 0.74  CALCIUM  10.0 9.3 9.0  GFRNONAA 49* >60 >60  ANIONGAP 10 11 10     Recent Labs  Lab 07/29/23 0014 07/29/23 0459 07/30/23 0225  PROT 6.7 5.9* 5.6*  ALBUMIN  3.2* 2.9* 2.6*  AST 22 19 27   ALT 17 15 23   ALKPHOS 63 58 70  BILITOT 0.9 0.8 0.9   Lipids No results for input(s): CHOL, TRIG, HDL, LABVLDL, LDLCALC, CHOLHDL in the last 168 hours.  Hematology Recent Labs  Lab 07/28/23 2135 07/29/23 0459 07/30/23 0225  WBC 23.8* 20.6* 19.1*  RBC 4.45 4.00* 3.91*  HGB 13.8 12.4* 12.0*  HCT 41.0 36.4* 36.1*  MCV 92.1 91.0 92.3  MCH 31.0 31.0 30.7  MCHC 33.7 34.1 33.2  RDW 13.1 13.1 13.2  PLT 194 187 180   Thyroid  No results for input(s): TSH, FREET4 in the last 168 hours.  BNPNo results for input(s): BNP, PROBNP in the last 168 hours.  DDimer No results for input(s): DDIMER in the last 168 hours.   Radiology/Studies:  CT Chest W  Contrast Result Date: 07/29/2023 CLINICAL DATA:  Sepsis,  weakness, fatigue. EXAM: CT CHEST WITH CONTRAST TECHNIQUE: Multidetector CT imaging of the chest was performed during intravenous contrast administration. RADIATION DOSE REDUCTION: This exam was performed according to the departmental dose-optimization program which includes automated exposure control, adjustment of the mA and/or kV according to patient size and/or use of iterative reconstruction technique. CONTRAST:  75mL OMNIPAQUE  IOHEXOL  350 MG/ML SOLN COMPARISON:  01/20/2011 FINDINGS: Cardiovascular: Heart is normal size. Aorta is normal caliber. Diffuse 3 vessel coronary artery disease. Aortic atherosclerosis. Mediastinum/Nodes: No mediastinal, hilar, or axillary adenopathy. Trachea and esophagus are unremarkable. Thyroid  unremarkable. Lungs/Pleura: Lungs are clear. No focal airspace opacities or suspicious nodules. No effusions. Upper Abdomen: See abdominal CT report Musculoskeletal: Chest wall soft tissues are unremarkable. No acute bony abnormality. IMPRESSION: No acute cardiopulmonary disease. Three vessel coronary artery disease. Aortic Atherosclerosis (ICD10-I70.0). Electronically Signed   By: Franky Crease M.D.   On: 07/29/2023 00:15   CT Head Wo Contrast Result Date: 07/29/2023 CLINICAL DATA:  Memory loss EXAM: CT HEAD WITHOUT CONTRAST TECHNIQUE: Contiguous axial images were obtained from the base of the skull through the vertex without intravenous contrast. RADIATION DOSE REDUCTION: This exam was performed according to the departmental dose-optimization program which includes automated exposure control, adjustment of the mA and/or kV according to patient size and/or use of iterative reconstruction technique. COMPARISON:  12/07/2022 FINDINGS: Brain: There is atrophy and chronic small vessel disease changes. No acute intracranial abnormality. Specifically, no hemorrhage, hydrocephalus, mass lesion, acute infarction, or significant intracranial injury. Vascular: No hyperdense vessel or unexpected  calcification. Skull: No acute calvarial abnormality. Sinuses/Orbits: No acute findings Other: None IMPRESSION: Atrophy, chronic microvascular disease. No acute intracranial abnormality. Electronically Signed   By: Franky Crease M.D.   On: 07/29/2023 00:14   CT ABDOMEN PELVIS W CONTRAST Result Date: 07/29/2023 CLINICAL DATA:  Weakness, fatigue.  Abdominal pain. EXAM: CT ABDOMEN AND PELVIS WITH CONTRAST TECHNIQUE: Multidetector CT imaging of the abdomen and pelvis was performed using the standard protocol following bolus administration of intravenous contrast. RADIATION DOSE REDUCTION: This exam was performed according to the departmental dose-optimization program which includes automated exposure control, adjustment of the mA and/or kV according to patient size and/or use of iterative reconstruction technique. CONTRAST:  75mL OMNIPAQUE  IOHEXOL  350 MG/ML SOLN COMPARISON:  06/06/2021 FINDINGS: Lower chest: No acute abnormality. Hepatobiliary: Multiple layering gallstones within the gallbladder. Gallbladder is distended with gallbladder wall thickening. Findings concerning for acute cholecystitis. No focal hepatic abnormality. Mild periportal edema. No biliary ductal dilatation. Pancreas: Atrophy of the pancreas. No focal abnormality or ductal dilatation. Spleen: No focal abnormality.  Normal size. Adrenals/Urinary Tract: Adrenal glands normal. Numerous bilateral nonobstructing renal stones. Bilateral renal cysts are similar prior study. No follow-up imaging recommended. No definite ureteral stones. In the left pelvis posterior to the bladder in the region of the distal left ureter. The ureter is decompressed and difficult to follows/visualized. Given the decompressed state, I favor this is likely an adjacent phlebolith. Stomach/Bowel: Prior Roux-en-Y gastric bypass. Stomach, large and small bowel grossly unremarkable. Vascular/Lymphatic: Aortoiliac atherosclerosis. No evidence of aneurysm or adenopathy. Reproductive:  No visible focal abnormality. Other: No free fluid or free air. Musculoskeletal: No acute bony abnormality. IMPRESSION: Cholelithiasis. Gallbladder distension with gallbladder wall thickening and surrounding inflammation concerning for acute cholecystitis. Periportal edema can be seen with volume overload/resuscitation or inflammatory processes such as hepatitis. Bilateral nephrolithiasis.  No hydronephrosis. Prior gastric bypass.  No visible complicating feature. Aortoiliac atherosclerosis. Electronically Signed   By:  Franky Crease M.D.   On: 07/29/2023 00:13   DG Ribs Unilateral W/Chest Right Result Date: 07/28/2023 CLINICAL DATA:  Recent fall with right rib pain, initial encounter EXAM: RIGHT RIBS AND CHEST - 3+ VIEW COMPARISON:  07/31/2022 FINDINGS: Cardiac shadow is within normal limits. Aortic calcifications are noted. The lungs are clear bilaterally. No infiltrate or pneumothorax is seen. No acute rib abnormality noted. IMPRESSION: No acute rib fracture seen. Electronically Signed   By: Francisco Devonshire M.D.   On: 07/28/2023 23:39     Assessment and Plan:   Permanent atrial fibrillation Preoperative evaluation cholecystectomy Generally well rate controlled with heart rates in the 90s.  He has been very concerned about his anticoagulation due to previous issues of GI bleed.  We discussed his concerns and he is amenable now to proceed with surgery possibly tomorrow.  Additionally, in regards to his preoperative risk I think he is well optimized and overall reasonable acceptable risk, RCRI 3.9%.  He is able to complete greater than 4 METS.  Denies any chest pain and with no history of CAD. Will follow general surgery's recommendations for anticoagulation and timing Borderline hypotensive, continue to hold beta-blocker with well-controlled rates. He does have a murmur on exam (suspect some AS) and additionally has not had an echocardiogram since 2016.  Will order an echo. Continue to correct  electrolytes.  Orthostatic hypotension Chronically on low-dose fludrocortisone    Risk Assessment/Risk Scores:     CHA2DS2-VASc Score = 2   This indicates a 2.2% annual risk of stroke. The patient's score is based upon: CHF History: 0 HTN History: 0 Diabetes History: 0 Stroke History: 0 Vascular Disease History: 1 Age Score: 1 Gender Score: 0  For questions or updates, please contact [facility] Please consult [email or web address] for contact info under    Signed, Thom LITTIE Sluder, PA-C  07/30/2023 3:07 PM   ADDENDUM:   Patient seen and examined with Thom LITTIE Sluder, PA-C  I personally taken a history, examined the patient, reviewed relevant notes,  laboratory data / imaging studies.  I performed a substantive portion of this encounter and formulated the important aspects of the plan.  I agree with the APP's note, impression, and recommendations; however, I have edited the note to reflect changes or salient points.  Patient seen and examined at bedside. Patient is being considered for laparoscopic cholecystectomy due to acute cholecystitis. He was scheduled for surgery earlier this morning but was concerned with regards to his bleeding risk given his history of GI bleed and bleeding after nasal septal surgery in the past. Therefore the surgery was not performed and due to patient's wishes cardiology is consulted for further recommendations/evaluation.  Clinically denies anginal chest pain or heart failure symptoms. At baseline he goes to the gym and does stationary bike for at least 30 minutes, used to do it for an hour.  He has cut down the duration of stationary biking as he was told 30 minutes of exercises plenty.  Otherwise no change in functional capacity.  No recent ACS events or acute decompensated heart failure.  PHYSICAL EXAM: Today's Vitals   07/30/23 0909 07/30/23 1056 07/30/23 1536 07/30/23 1600  BP:  92/71 (!) 87/75 103/70  Pulse:  89 96   Resp:  19 20   Temp:   98.5 F (36.9 C) 98.6 F (37 C)   TempSrc:  Oral Oral   SpO2:  100% 98%   Weight:      Height:  PainSc: 3       Body mass index is 24.9 kg/m.   Net IO Since Admission: 4,868.09 mL [07/30/23 1912]  Filed Weights   07/29/23 1408 07/30/23 0421  Weight: 90.4 kg 92.8 kg   General: Age-appropriate male, no acute distress, hemodynamically stable. HEENT: Normocephalic, atraumatic, no JVP, trachea midline, dry mucous membranes. Heart: Irregularly irregular, systolic ejection murmur at the second costal space, no gallops or rubs. Lungs: Clear to auscultation bilaterally.  No wheezes rales or rhonchi's. GI: Tender to touch, soft, positive bowel sounds in all 4 quadrants. Extremities: Warm to touch, no pitting edema  EKG: (personally reviewed by me) July 28, 2023: Atrial fibrillation, controlled ventricular rate, 102 bpm, without underlying ischemia injury pattern  Telemetry: (personally reviewed by me) Atrial fibrillation with controlled ventricular rate   Impression:  Preoperative cardiovascular examination. Permanent atrial fibrillation. Long-term oral anticoagulation. Acute cholecystitis Chronic hypotension.   Recommendations:  Preoperative cardiovascular examination: Clinically denies anginal chest pain or heart failure symptoms. EKG is nonischemic. Overall functional capacity is stable and able to perform at least 4 METS of activity. Known history of aortic stenosis; however, no recent studies available for review.  Echo will be ordered to evaluate for structural heart disease and left ventricular systolic function.  This will also aid in managing the patient better for upcoming surgery with regards to anesthesia. Overall acceptable risk from a cardiovascular standpoint. He is optimized and no additional ischemic evaluation is warranted at this time. His last dose of Eliquis  according to the patient was Friday, July 27, 2023. He has been off of anticoagulation for  48 hours which is the recommended duration prior to an elective procedure.  Patient's questions and concerns were addressed to satisfaction.  He finds the preoperative risk assessment acceptable.  Permanent atrial fibrillation: Long-term oral anticoagulation: On AV nodal blocking agents for rate control strategy, currently being held due to soft blood pressures in the setting of acute cholecystitis/?  Sepsis. Anticoagulation currently being held secondary to upcoming surgery. I did speak to internal medicine physician with regards to having a better clarification of when the laparoscopic cholecystectomy surgery is going to be.  Would like to minimize the duration of time when he is off of anticoagulation to minimize his risk for thromboembolic event.  If the surgery is not until later this week would recommend IV heparin  drip for A-fib indication which can be easily titrated prior to surgery as per surgeons recommendations.  Aortic stenosis: Echocardiogram ordered.  Clinically asymptomatic.  Acute cholecystitis: Management per primary and surgical team.  Currently being considered for laparoscopic cholecystectomy.  Further recommendations to follow as the case evolves.   This note was created using a voice recognition software as a result there may be grammatical errors inadvertently enclosed that do not reflect the nature of this encounter. Every attempt is made to correct such errors.   Madonna Large, DO, [facility]  [address] [place], [place] [address] Pager: [phone] Office: [phone] 07/30/2023 7:12 PM

## 2023-07-30 NOTE — Telephone Encounter (Signed)
 Patient is calling to talk with Dr. Anne Fu to see if he needs to be off of his Eliquis. Patient is currently in the hospital awaiting gallbladder surgery which will occur in 3 days.

## 2023-07-30 NOTE — Progress Notes (Signed)
 CSW met pt at bedisde per [facility] and pts request fro utility resources. CSW offered pt resource packet and pt accepted. Copy of resources placed on pts chart.  Johnnette Gourd, MSW, LCSWA Transitions of Care [phone]

## 2023-07-30 NOTE — Progress Notes (Signed)
 [facility] PROGRESS NOTE  [facility] (DOB: 22-Nov-1950) FMW:[id number] PCP: Sherlynn Madden, MD  Brief Narrative: [facility] is a 73 y.o. 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   Subjective: He's very nervous. Slept ok, had CPAP says it could be better, but it's fine. He is having pain in his right abdomen that was better with medications. Says he knows nothing about the potential surgery today, but when pressed, he does know about laparoscopic incisions, and the process that must have been described to him at some point. Asks when he'll leave and where he'll go. Seemed unsatisfied with plan to have PT/OT evaluate him and see how he recovers before answering definitively.   Objective: BP 101/76 (BP Location: Left Arm)   Pulse 93   Temp 98.2 F (36.8 C) (Oral)   Resp 17   Ht 6' 4 (1.93 m)   Wt 92.8 kg   SpO2 97%   BMI 24.90 kg/m   Gen: No physical distress, very anxious-appearing Pulm: Clear, nonlabored. Hurts RUQ abd when he takes a deep breath CV: Irreg irreg, rate 90's, no MRG or pitting edema GI: Soft, tender RUQ, +BS Neuro: Alert and oriented. No new focal deficits. Ext: Warm, no deformities Skin: No new rashes, lesions or ulcers on visualized skin   Assessment & Plan: Sepsis due to acute cholecystitis: Hepatitis panel negative.  - Continue zosyn  - Follow up blood cultures - Surgery following, allowing eliquis  washout, current plan appears to be laparoscopic cholecystectomy 1/6, surgery will be speaking with patient this morning to address concerns. I will order PT/OT to further address his disposition concerns.  - Continue pain control.  - Defer diet to surgery (currently CLD)  AKI: Improving Cr 1.51 > 0.7 which is  baseline and CrCl >31ml/min. FENa was low. - Avoid hypotension. No further IVF unless patient will be NPO for extended duration  PAF: Afib on admission, remains rate controlled. Rates in 90's overnight. When he's very anxious, rates are 100-105bpm.  - Monitor tele - Holding eliquis  - Not on rate control agent at baseline.   Insomnia:  - No changes to home regimen  Peripheral neuropathy:  - Continue gabapentin   Abnormal hepatic appearance by CT: Hepatitis panel negative - Hold statin  Chronic hypotension:  - Continue florinef    Francisco KATHEE Come, MD [facility] [email or web address] 07/30/2023, 8:20 AM

## 2023-07-30 NOTE — Progress Notes (Signed)
 Mobility Specialist Progress Note:   07/30/23 1018  Mobility  Activity Ambulated with assistance in hallway  Level of Assistance Contact guard assist, steadying assist  Assistive Device Front wheel walker  Distance Ambulated (ft) 250 ft  Activity Response Tolerated well  Mobility Referral Yes  Mobility visit 1 Mobility  Mobility Specialist Start Time (ACUTE ONLY) 1018  Mobility Specialist Stop Time (ACUTE ONLY) 1030  Mobility Specialist Time Calculation (min) (ACUTE ONLY) 12 min   Pt agreeable to mobility session. Required intermittent MinG, mostly supervision for ambulation. Pt had x1 minor LOB, pt able to self-correct. No c/o throughout, pt back in bed with all needs met.  Therisa Rana Mobility Specialist Please contact via SecureChat or  Rehab office at [phone]

## 2023-07-30 NOTE — Telephone Encounter (Signed)
 According to documentation from MD in hospital today - Eliquis is on hold. Will forward to Dr Anne Fu to review for any further needs regarding anticoagulation.

## 2023-07-30 NOTE — Evaluation (Signed)
 Physical Therapy Evaluation Patient Details Name: JAMMAL SARR MRN: [id number] DOB: 1951-04-09 Today's Date: 07/30/2023  History of Present Illness  Pt is a 73 y/o male admitted for sepsis due to acute cholecystitis and AKI. Pending potential surgery this admission. 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 Pt demonstrates slowed processing, increased time for recall of short term and remote memory, and tangential thought throughout session. Pt reports very limited caregiver support from friends and is uncertain he will be able to adequately care for himself after surgery. PT will follow up after surgery to re-evaluate mobility, however short term inpatient PT services may be indicated due to lack of support and impairments in endurance and cognition.        If plan is discharge home, recommend the following: A little help with walking and/or transfers;A little help with bathing/dressing/bathroom;Assistance with cooking/housework;Direct supervision/assist for medications management;Direct supervision/assist for financial management;Assist for transportation;Supervision due to cognitive status;Help with stairs or ramp for entrance   Can travel by private vehicle   Yes    Equipment Recommendations Rolling walker (2 wheels);BSC/3in1  Recommendations for Other Services       Functional Status Assessment Patient has had a recent decline in their functional status and demonstrates the ability to make significant improvements in function in a reasonable and predictable amount of time.     Precautions / Restrictions Precautions Precautions: Fall Restrictions Weight Bearing Restrictions Per Provider Order: No      Mobility  Bed Mobility Overal bed mobility: Needs Assistance Bed Mobility: Supine to Sit, Sit to  Supine     Supine to sit: Supervision, HOB elevated Sit to supine: Contact guard assist, HOB elevated        Transfers Overall transfer level: Needs assistance Equipment used: Rolling walker (2 wheels) Transfers: Sit to/from Stand Sit to Stand: Contact guard assist                Ambulation/Gait Ambulation/Gait assistance: Contact guard assist Gait Distance (Feet): 200 Feet Assistive device: Rolling walker (2 wheels) Gait Pattern/deviations: Step-through pattern Gait velocity: reduced Gait velocity interpretation: <1.8 ft/sec, indicate of risk for recurrent falls   General Gait Details: slowed step-through gait  Stairs            Wheelchair Mobility     Tilt Bed    Modified Rankin (Stroke Patients Only)       Balance Overall balance assessment: Needs assistance Sitting-balance support: No upper extremity supported, Feet supported Sitting balance-Leahy Scale: Good     Standing balance support: Single extremity supported, Reliant on assistive device for balance Standing balance-Leahy Scale: Poor                               Pertinent Vitals/Pain Pain Assessment Pain Assessment: Faces Faces Pain Scale: Hurts even more Pain Location: RUQ Pain Descriptors / Indicators: Aching Pain Intervention(s): Monitored during session    Home Living Family/patient expects to be discharged to:: Private residence Living Arrangements: Alone Available Help at Discharge: Friend(s);Available PRN/intermittently Type of Home: House Home Access: Ramped entrance     Alternate Level Stairs-Number of Steps: flight Home Layout: Two level Home Equipment: None Additional Comments: pt reports no DME at home from prior L TKA - need to confirm. Pt reports toilet upstairs does not work where  bedroom is but toilet downstairs works. reports no heat on first floor of home    Prior Function Prior Level of Function : Independent/Modified Independent;Driving              Mobility Comments: no AD, cycles, recently working with OP PT ADLs Comments: Indep with ADLs, IADLs     Extremity/Trunk Assessment   Upper Extremity Assessment Upper Extremity Assessment: Overall WFL for tasks assessed    Lower Extremity Assessment Lower Extremity Assessment: Generalized weakness    Cervical / Trunk Assessment Cervical / Trunk Assessment: Kyphotic  Communication   Communication Communication: No apparent difficulties (tangential at times) Cueing Techniques: Verbal cues  Cognition Arousal: Alert Behavior During Therapy: WFL for tasks assessed/performed Overall Cognitive Status: Impaired/Different from baseline Area of Impairment: Memory, Problem solving                     Memory: Decreased short-term memory       Problem Solving: Slow processing General Comments: tangential thought at times        General Comments General comments (skin integrity, edema, etc.): tachycardic into 120s with activity    Exercises     Assessment/Plan    PT Assessment Patient needs continued PT services  PT Problem List Decreased strength;Decreased activity tolerance;Decreased balance;Decreased mobility;Decreased cognition;Pain       PT Treatment Interventions DME instruction;Gait training;Stair training;Functional mobility training;Therapeutic activities;Neuromuscular re-education;Balance training;Therapeutic exercise;Patient/family education    PT Goals (Current goals can be found in the Care Plan section)  Acute Rehab PT Goals Patient Stated Goal: to return to independence PT Goal Formulation: With patient Time For Goal Achievement: 08/13/23 Potential to Achieve Goals: Fair    Frequency Min 1X/week     Co-evaluation               AM-PAC PT 6 Clicks Mobility  Outcome Measure Help needed turning from your back to your side while in a flat bed without using bedrails?: A Little Help needed moving from lying on your back to sitting  on the side of a flat bed without using bedrails?: A Little Help needed moving to and from a bed to a chair (including a wheelchair)?: A Little Help needed standing up from a chair using your arms (e.g., wheelchair or bedside chair)?: A Little Help needed to walk in hospital room?: A Little Help needed climbing 3-5 steps with a railing? : A Lot 6 Click Score: 17    End of Session   Activity Tolerance: Patient tolerated treatment well Patient left: in bed;with call bell/phone within reach;with bed alarm set Nurse Communication: Mobility status PT Visit Diagnosis: Other abnormalities of gait and mobility (R26.89);Muscle weakness (generalized) (M62.81)    Time: 8458-8379 PT Time Calculation (min) (ACUTE ONLY): 39 min   Charges:   PT Evaluation $PT Eval Low Complexity: 1 Low   PT General Charges $$ ACUTE PT VISIT: 1 Visit         Bernardino JINNY Ruth, PT, DPT Acute Rehabilitation Office [phone]   Bernardino JINNY Ruth 07/30/2023, 4:39 PM

## 2023-07-30 NOTE — TOC Initial Note (Signed)
 Transition of Care [facility]) - Initial/Assessment Note    Patient Details  Name: Francisco Padilla MRN: [id number] Date of Birth: 07-20-1951  [facility]) CM/SW Contact:    Waddell Barnie Rama, RN Phone Number: 07/30/2023, 3:13 PM  Clinical Narrative:                 From home alone, has PCP and insurance on file, states has no HH services in place at this time or DME at home.  States he is not sure how he will get home at dc and he has no family support.  States gets medications from [facility] on [address] in [place].   Pta self ambulatory , but now has to use walker sometimes due to weakness. He states he left his car at [employer] parking lot. Also he states he has no heat downstairs in his home.   Expected Discharge Plan: Home w Home Health Services Barriers to Discharge: Continued Medical Work up   Patient Goals and CMS Choice Patient states their goals for this hospitalization and ongoing recovery are:: get better   Choice offered to / list presented to : NA      Expected Discharge Plan and Services In-house Referral: NA Discharge Planning Services: CM Consult Post Acute Care Choice: NA Living arrangements for the past 2 months: Single Family Home                 DME Arranged: N/A DME Agency: NA       HH Arranged: NA          Prior Living Arrangements/Services Living arrangements for the past 2 months: Single Family Home Lives with:: Self Patient language and need for interpreter reviewed:: Yes Do you feel safe going back to the place where you live?: Yes      Need for Family Participation in Patient Care: No (Comment) Care giver support system in place?: No (comment)   Criminal Activity/Legal Involvement Pertinent to Current Situation/Hospitalization: No - Comment as needed  Activities of Daily Living      Permission Sought/Granted Permission sought to share information with : Case Manager Permission granted to share information with :  Yes, Verbal Permission Granted              Emotional Assessment Appearance:: Appears stated age Attitude/Demeanor/Rapport: Engaged Affect (typically observed): Appropriate Orientation: : Oriented to Self, Oriented to Place, Oriented to  Time, Oriented to Situation Alcohol / Substance Use: Not Applicable Psych Involvement: No (comment)  Admission diagnosis:  Acute cholecystitis [K81.0] Cholecystitis [K81.9] Patient Active Problem List   Diagnosis Date Noted   Acute cholecystitis 07/29/2023   Hepatitis 07/29/2023   AKI (acute kidney injury) (HCC) 07/29/2023   GAD (generalized anxiety disorder) 07/29/2023   Sepsis (HCC) 07/29/2023   Memory loss 02/28/2023   S/P bariatric surgery 02/28/2023   Total knee replacement status, 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   Unilateral primary osteoarthritis, left knee    Longstanding persistent atrial fibrillation (HCC) 04/26/2021   Preop cardiovascular exam 04/26/2021   History of chronic hypotension 02/20/2019   Chronic anticoagulation 03/05/2015   Abnormal chest x-ray with multiple lung nodules 12/14/2014   AS (aortic stenosis) 12/14/2014   Atrial flutter by electrocardiogram (HCC) 12/14/2014   CAD (coronary artery disease) 12/14/2014   DJD (degenerative joint disease) 12/14/2014   History of GI bleed 12/14/2014   Nephrolithiasis 12/14/2014   Other activity(E029.9) 12/14/2014   Prediabetes 12/14/2014   Thrombocytopenia (HCC) 12/14/2014   Arterial blood pressure decreased 10/28/2014  Atrial fibrillation (HCC) 09/02/2014   Morbid obesity (HCC) 09/02/2014   Paroxysmal atrial fibrillation (HCC) 08/03/2014   Gastroduodenal ulcer 08/03/2014   Apnea, sleep 08/03/2014   Glaucoma suspect 02/21/2013   Glaucoma suspect of both eyes 02/21/2013   Herpes 04/11/2012   Routine general medical examination at a health care facility 04/11/2012   Hyperlipidemia 05/31/2009   Depression 05/31/2009   Attention deficit disorder 05/31/2009   OSA on CPAP 05/31/2009    GERD 05/31/2009   GOUT, HX OF 05/31/2009   PCP:  Sherlynn Madden, MD Pharmacy:   [facility] [id number] - [place], [place] - [address] [place] [address] Phone: [phone] Fax: [phone]     Social Drivers of Health (SDOH) Social History: SDOH Screenings   Food Insecurity: No Food Insecurity (07/29/2023)  Housing: Low Risk  (07/29/2023)  Transportation Needs: No Transportation Needs (07/29/2023)  Utilities: Not At Risk (07/29/2023)  Social Connections: Unknown (07/07/2022)   Received from [facility], [facility]  Tobacco Use: Low Risk  (07/29/2023)   SDOH Interventions: Food Insecurity Interventions: Intervention Not Indicated   Readmission Risk Interventions     No data to display

## 2023-07-30 NOTE — Evaluation (Signed)
 Occupational Therapy Evaluation Patient Details Name: Francisco Padilla MRN: [id number] DOB: 09/15/1950 Today's Date: 07/30/2023   History of Present Illness Pt is a 73 y/o male admitted for sepsis due to acute cholecystitis and AKI. Pending potential surgery this admission. 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 Pt presents now with minor deficits in strength, standing balance and endurance. Pt pleasant, talkative and benefits from use of RW for hallway mobility at this time. Overall, pt requires no more than CGA for ADLs/mobility and anticipate improvements with continued mobility. Noted plan for surgical intervention while admitted; will finalize DC recs post op but at this time, anticipate no OT needs at DC.       If plan is discharge home, recommend the following: Assistance with cooking/housework    Functional Status Assessment  Patient has had a recent decline in their functional status and demonstrates the ability to make significant improvements in function in a reasonable and predictable amount of time.  Equipment Recommendations  Other (comment) (RW; TBD)    Recommendations for Other Services       Precautions / Restrictions Precautions Precautions: Fall Restrictions Weight Bearing Restrictions Per Provider Order: No      Mobility Bed Mobility Overal bed mobility: Modified Independent                  Transfers Overall transfer level: Needs assistance Equipment used: Rolling walker (2 wheels) Transfers: Sit to/from Stand Sit to Stand: Contact guard assist           General transfer comment: able to stand with increased effort for regular height bed      Balance Overall balance assessment: Needs assistance Sitting-balance support: No upper extremity supported, Feet  supported Sitting balance-Leahy Scale: Good     Standing balance support: Bilateral upper extremity supported, No upper extremity supported, During functional activity Standing balance-Leahy Scale: Fair Standing balance comment: able to stand statically without support, use of RW for mobility w/ minor unsteadiness noted with head turns                           ADL either performed or assessed with clinical judgement   ADL Overall ADL's : Needs assistance/impaired Eating/Feeding: Independent   Grooming: Supervision/safety;Standing   Upper Body Bathing: Set up   Lower Body Bathing: Contact guard assist;Sit to/from stand   Upper Body Dressing : Set up;Sitting   Lower Body Dressing: Contact guard assist;Sitting/lateral leans;Sit to/from stand Lower Body Dressing Details (indicate cue type and reason): CGA if in standing. educated on potential abdominal prec with LB ADLs with pt able to easily bring B feet to self w/ figure four position Toilet Transfer: Contact guard assist;Ambulation;Rolling walker (2 wheels)   Toileting- Clothing Manipulation and Hygiene: Contact guard assist;Sitting/lateral lean;Sit to/from stand       Functional mobility during ADLs: Contact guard assist;Rolling walker (2 wheels)       Vision   Vision Assessment?: No apparent visual deficits     Perception         Praxis         Pertinent Vitals/Pain       Extremity/Trunk Assessment Upper Extremity Assessment Upper Extremity Assessment: Overall WFL for tasks assessed;Right hand dominant   Lower Extremity Assessment Lower Extremity Assessment: Defer to PT evaluation  Cervical / Trunk Assessment Cervical / Trunk Assessment: Normal   Communication Communication Communication: No apparent difficulties;Other (comment) (hyperverbose) Cueing Techniques: Verbal cues;Gestural cues   Cognition Arousal: Alert Behavior During Therapy: WFL for tasks assessed/performed Overall Cognitive  Status: Within Functional Limits for tasks assessed                                 General Comments: very pleasant and shows insight into medical complexities and involved in care. hyperverbose and does benefit from cues to redirect     General Comments       Exercises     Shoulder Instructions      Home Living Family/patient expects to be discharged to:: Private residence Living Arrangements: Alone Available Help at Discharge: Friend(s);Available PRN/intermittently Type of Home: House Home Access: Ramped entrance     Home Layout: Two level (bedroom upstairs)         Bathroom Toilet: Standard     Home Equipment: None   Additional Comments: pt reports no DME at home from prior L TKA - need to confirm. Pt reports toilet upstairs does not work where bedroom is but toilet downstairs works. reports no heat on first floor of home      Prior Functioning/Environment Prior Level of Function : Independent/Modified Independent;Driving             Mobility Comments: no AD, cycles, recently working with OP PT ADLs Comments: Indep with ADLs, IADLs        OT Problem List: Decreased strength;Decreased activity tolerance;Impaired balance (sitting and/or standing);Pain      OT Treatment/Interventions: Self-care/ADL training;Therapeutic exercise;Energy conservation;DME and/or AE instruction;Therapeutic activities;Patient/family education    OT Goals(Current goals can be found in the care plan section) Acute Rehab OT Goals Patient Stated Goal: ensure no bleeding complications post op as occurred with prior sx OT Goal Formulation: With patient Time For Goal Achievement: 08/13/23 Potential to Achieve Goals: Good ADL Goals Pt Will Perform Lower Body Dressing: with modified independence;sitting/lateral leans;sit to/from stand Pt Will Transfer to Toilet: with modified independence;ambulating Additional ADL Goal #1: Pt to demo ability to gather ADL/IADL items with  Modified Independence without LOB Additional ADL Goal #2: Pt to verbalize at least 3 fall prevention strategies to implement at home Additional ADL Goal #3:  (N/A)  OT Frequency: Min 1X/week    Co-evaluation              AM-PAC OT 6 Clicks Daily Activity     Outcome Measure Help from another person eating meals?: None Help from another person taking care of personal grooming?: A Little Help from another person toileting, which includes using toliet, bedpan, or urinal?: A Little Help from another person bathing (including washing, rinsing, drying)?: A Little Help from another person to put on and taking off regular upper body clothing?: A Little Help from another person to put on and taking off regular lower body clothing?: A Little 6 Click Score: 19   End of Session Equipment Utilized During Treatment: Rolling walker (2 wheels);Gait belt Nurse Communication: Mobility status  Activity Tolerance: Patient tolerated treatment well Patient left: in bed;with call bell/phone within reach  OT Visit Diagnosis: Unsteadiness on feet (R26.81);Other abnormalities of gait and mobility (R26.89);Muscle weakness (generalized) (M62.81)                Time: 8744-8650 OT Time Calculation (min): 54 min Charges:  OT General Charges $OT Visit: 1 Visit  OT Evaluation $OT Eval Moderate Complexity: 1 Mod OT Treatments $Self Care/Home Management : 8-22 mins $Therapeutic Activity: 23-37 mins  Mliss NOVAK, OTR/L Acute Rehab Services Office: [phone]   Mliss Getting 07/30/2023, 2:27 PM

## 2023-07-31 ENCOUNTER — Inpatient Hospital Stay (HOSPITAL_COMMUNITY): Payer: Medicare Other

## 2023-07-31 DIAGNOSIS — Z01810 Encounter for preprocedural cardiovascular examination: Secondary | ICD-10-CM | POA: Diagnosis not present

## 2023-07-31 DIAGNOSIS — K810 Acute cholecystitis: Secondary | ICD-10-CM | POA: Diagnosis not present

## 2023-07-31 DIAGNOSIS — R55 Syncope and collapse: Secondary | ICD-10-CM | POA: Diagnosis not present

## 2023-07-31 DIAGNOSIS — I4821 Permanent atrial fibrillation: Secondary | ICD-10-CM | POA: Diagnosis not present

## 2023-07-31 DIAGNOSIS — I350 Nonrheumatic aortic (valve) stenosis: Secondary | ICD-10-CM | POA: Diagnosis not present

## 2023-07-31 DIAGNOSIS — E782 Mixed hyperlipidemia: Secondary | ICD-10-CM

## 2023-07-31 LAB — COMPREHENSIVE METABOLIC PANEL
ALT: 49 U/L — ABNORMAL HIGH (ref 0–44)
AST: 48 U/L — ABNORMAL HIGH (ref 15–41)
Albumin: 2.4 g/dL — ABNORMAL LOW (ref 3.5–5.0)
Alkaline Phosphatase: 78 U/L (ref 38–126)
Anion gap: 9 (ref 5–15)
BUN: 20 mg/dL (ref 8–23)
CO2: 22 mmol/L (ref 22–32)
Calcium: 8.9 mg/dL (ref 8.9–10.3)
Chloride: 104 mmol/L (ref 98–111)
Creatinine, Ser: 0.73 mg/dL (ref 0.61–1.24)
GFR, Estimated: >60 mL/min (ref >60)
Glucose, Bld: 105 mg/dL — ABNORMAL HIGH (ref 70–99)
Potassium: 3.4 mmol/L — ABNORMAL LOW (ref 3.5–5.1)
Sodium: 135 mmol/L (ref 135–145)
Total Bilirubin: 1.4 mg/dL — ABNORMAL HIGH (ref 0.0–1.2)
Total Protein: 5.5 g/dL — ABNORMAL LOW (ref 6.5–8.1)

## 2023-07-31 LAB — ECHOCARDIOGRAM COMPLETE
AR max vel: 0.99 cm2
AV Area VTI: 0.96 cm2
AV Area mean vel: 0.98 cm2
AV Mean grad: 20.3 mm[Hg]
AV Peak grad: 34.3 mm[Hg]
Ao pk vel: 2.93 m/s
Area-P 1/2: 6.32 cm2
Calc EF: 58 %
Height: 76 in
S' Lateral: 3.3 cm
Single Plane A2C EF: 52 %
Single Plane A4C EF: 59.8 %
Weight: 3280.44 [oz_av]

## 2023-07-31 LAB — CBC
HCT: 34 % — ABNORMAL LOW (ref 39.0–52.0)
Hemoglobin: 11.5 g/dL — ABNORMAL LOW (ref 13.0–17.0)
MCH: 30.9 pg (ref 26.0–34.0)
MCHC: 33.8 g/dL (ref 30.0–36.0)
MCV: 91.4 fL (ref 80.0–100.0)
Platelets: 175 10*3/uL (ref 150–400)
RBC: 3.72 MIL/uL — ABNORMAL LOW (ref 4.22–5.81)
RDW: 13.1 % (ref 11.5–15.5)
WBC: 15.5 10*3/uL — ABNORMAL HIGH (ref 4.0–10.5)
nRBC: 0 % (ref 0.0–0.2)

## 2023-07-31 LAB — SURGICAL PCR SCREEN
MRSA, PCR: NEGATIVE
Staphylococcus aureus: POSITIVE — AB

## 2023-07-31 LAB — MAGNESIUM: Magnesium: 1.7 mg/dL (ref 1.7–2.4)

## 2023-07-31 MED ORDER — HEPARIN (PORCINE) 25000 UT/250ML-% IV SOLN
1300.0000 [IU]/h | INTRAVENOUS | Status: AC
  Administered 2023-07-31: 1300 [IU]/h via INTRAVENOUS
  Filled 2023-07-31: qty 250

## 2023-07-31 MED ORDER — PERFLUTREN LIPID MICROSPHERE
1.0000 mL | INTRAVENOUS | Status: DC | PRN
  Administered 2023-07-31: 1 mL via INTRAVENOUS

## 2023-07-31 MED ORDER — MAGNESIUM SULFATE 2 GM/50ML IV SOLN
2.0000 g | Freq: Once | INTRAVENOUS | Status: AC
  Administered 2023-07-31: 2 g via INTRAVENOUS
  Filled 2023-07-31: qty 50

## 2023-07-31 MED ORDER — POTASSIUM CHLORIDE CRYS ER 20 MEQ PO TBCR
40.0000 meq | EXTENDED_RELEASE_TABLET | Freq: Once | ORAL | Status: AC
  Administered 2023-07-31: 40 meq via ORAL
  Filled 2023-07-31: qty 2

## 2023-07-31 NOTE — Progress Notes (Signed)
 Subjective: Patient with pain in RUQ.    ROS: See above, otherwise other systems negative  Objective: Vital signs in last 24 hours: Temp:  [97.3 F (36.3 C)-100.8 F (38.2 C)] 97.7 F (36.5 C) (01/07 0717) Pulse Rate:  [96-109] 97 (01/07 0717) Resp:  [20] 20 (01/07 0717) BP: (87-103)/(70-75) 96/74 (01/07 0717) SpO2:  [97 %-100 %] 98 % (01/07 0717) Weight:  [93 kg] 93 kg (01/07 0635) Last BM Date : 07/30/23  Intake/Output from previous day: 01/06 0701 - 01/07 0700 In: 1977 [P.O.:1977] Out: 1000 [Urine:1000] Intake/Output this shift: No intake/output data recorded.  PE: Gen: NAD Abd: soft, tender in RUQ, ND, +BS  Lab Results:  Recent Labs    07/30/23 0225 07/31/23 0255  WBC 19.1* 15.5*  HGB 12.0* 11.5*  HCT 36.1* 34.0*  PLT 180 175   BMET Recent Labs    07/30/23 0225 07/31/23 0255  NA 135 135  K 3.4* 3.4*  CL 104 104  CO2 21* 22  GLUCOSE 109* 105*  BUN 30* 20  CREATININE 0.74 0.73  CALCIUM  9.0 8.9   PT/INR No results for input(s): LABPROT, INR in the last 72 hours. CMP     Component Value Date/Time   NA 135 07/31/2023 0255   K 3.4 (L) 07/31/2023 0255   CL 104 07/31/2023 0255   CO2 22 07/31/2023 0255   GLUCOSE 105 (H) 07/31/2023 0255   BUN 20 07/31/2023 0255   CREATININE 0.73 07/31/2023 0255   CREATININE 0.77 07/19/2023 1128   CALCIUM  8.9 07/31/2023 0255   PROT 5.5 (L) 07/31/2023 0255   ALBUMIN  2.4 (L) 07/31/2023 0255   AST 48 (H) 07/31/2023 0255   ALT 49 (H) 07/31/2023 0255   ALKPHOS 78 07/31/2023 0255   BILITOT 1.4 (H) 07/31/2023 0255   GFRNONAA >60 07/31/2023 0255   GFRAA  10/23/2009 0135    >60        The eGFR has been calculated using the MDRD equation. This calculation has not been validated in all clinical situations. eGFR's persistently <60 mL/min signify possible Chronic Kidney Disease.   Lipase     Component Value Date/Time   LIPASE 29 07/29/2023 0014       Studies/Results: ECHOCARDIOGRAM  COMPLETE Result Date: 07/31/2023    ECHOCARDIOGRAM REPORT   Patient Name:   Francisco Padilla Date of Exam: 07/31/2023 Medical Rec #:  [id number]     Height:       76.0 in Accession #:    [id number]    Weight:       205.0 lb Date of Birth:  01-19-1951     BSA:          2.239 m Patient Age:    72 years      BP:           96/74 mmHg Patient Gender: M             HR:           100 bpm. Exam Location:  Inpatient Procedure: 2D Echo, Cardiac Doppler, Color Doppler and Intracardiac            Opacification Agent Indications:    Preoperative Evaluation  History:        Patient has prior history of Echocardiogram examinations, most                 recent 09/11/2014. CAD, Aortic Valve Disease, Arrythmias:Atrial  Fibrillation; Signs/Symptoms:Hypotension.  Sonographer:    Lanell Maduro Referring Phys: [id number] SHENG L HALEY  Sonographer Comments: Suboptimal parasternal window and suboptimal apical window. IMPRESSIONS  1. The aortic valve is severely calcified in views obtained. Vmax 2.9 m/s, MG 20 mmHG, AVA 0.96 cm2, DI 0.21. LV SVI low 22 cc/m2. 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 If there are clinical concerns for severe aortic stenosis, would recommend an aortic valve calcium  score for clarification. The aortic valve is calcified. Aortic valve regurgitation is not visualized. Moderate aortic valve stenosis. Aortic valve area, by VTI measures 0.96 cm. Aortic valve mean gradient measures 20.3 mmHg. Aortic valve Vmax measures 2.93 m/s.  2. Left ventricular ejection fraction, by estimation, is 60 to 65%. The left ventricle has normal function. The left ventricle has no regional wall motion abnormalities. Indeterminate diastolic filling due to E-A fusion.  3. Right ventricular systolic function is mildly reduced. The right ventricular size is mildly enlarged. There is normal pulmonary artery systolic pressure. The estimated right ventricular systolic pressure is 30.2  mmHg.  4. Left atrial size was moderately dilated.  5. Right atrial size was mildly dilated.  6. The mitral valve is grossly normal. Trivial mitral valve regurgitation. No evidence of mitral stenosis.  7. The inferior vena cava is normal in size with greater than 50% respiratory variability, suggesting right atrial pressure of 3 mmHg. FINDINGS  Left Ventricle: Left ventricular ejection fraction, by estimation, is 60 to 65%. The left ventricle has normal function. The left ventricle has no regional wall motion abnormalities. Definity  contrast agent was given IV to delineate the left ventricular  endocardial borders. The left ventricular internal cavity size was normal in size. There is no left ventricular hypertrophy. Indeterminate diastolic filling due to E-A fusion. Right Ventricle: The right ventricular size is mildly enlarged. No increase in right ventricular wall thickness. Right ventricular systolic function is mildly reduced. There is normal pulmonary artery systolic pressure. The tricuspid regurgitant velocity  is 1.95 m/s, and with an assumed right atrial pressure of 15 mmHg, the estimated right ventricular systolic pressure is 30.2 mmHg. Left Atrium: Left atrial size was moderately dilated. Right Atrium: Right atrial size was mildly dilated. Pericardium: There is no evidence of pericardial effusion. Mitral Valve: The mitral valve is grossly normal. Trivial mitral valve regurgitation. No evidence of mitral valve stenosis. Tricuspid Valve: The tricuspid valve is grossly normal. Tricuspid valve regurgitation is trivial. No evidence of tricuspid stenosis. Aortic Valve: The aortic valve is severely calcified in views obtained. Vmax 2.9 m/s, MG 20 mmHG, AVA 0.96 cm2, DI 0.21. LV SVI low 22 cc/m2. 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 If there are clinical concerns for severe aortic stenosis, would recommend an aortic valve calcium  score for clarification. The aortic  valve is calcified. Aortic valve regurgitation is not visualized. Moderate aortic stenosis is present. Aortic valve mean gradient measures 20.3 mmHg. Aortic valve peak gradient measures 34.3 mmHg. Aortic valve area, by VTI measures 0.96 cm. Pulmonic Valve: The pulmonic valve was grossly normal. Pulmonic valve regurgitation is not visualized. No evidence of pulmonic stenosis. Aorta: The aortic root and ascending aorta are structurally normal, with no evidence of dilitation. Venous: The inferior vena cava is normal in size with greater than 50% respiratory variability, suggesting right atrial pressure of 3 mmHg. IAS/Shunts: The atrial septum is grossly normal.  LEFT VENTRICLE PLAX 2D LVIDd:         4.70 cm  Diastology LVIDs:         3.30 cm      LV e' medial:    10.40 cm/s LV PW:         1.00 cm      LV E/e' medial:  5.8 LV IVS:        0.90 cm      LV e' lateral:   14.60 cm/s LVOT diam:     2.40 cm      LV E/e' lateral: 4.1 LV SV:         50 LV SV Index:   22 LVOT Area:     4.52 cm  LV Volumes (MOD) LV vol d, MOD A2C: 61.7 ml LV vol d, MOD A4C: 113.0 ml LV vol s, MOD A2C: 29.6 ml LV vol s, MOD A4C: 45.4 ml LV SV MOD A2C:     32.1 ml LV SV MOD A4C:     113.0 ml LV SV MOD BP:      51.5 ml RIGHT VENTRICLE            IVC RV Basal diam:  4.20 cm    IVC diam: 3.20 cm RV Mid diam:    3.40 cm RV S prime:     9.48 cm/s TAPSE (M-mode): 1.7 cm LEFT ATRIUM            Index        RIGHT ATRIUM           Index LA diam:      5.60 cm  2.50 cm/m   RA Area:     17.50 cm LA Vol (A4C): 115.0 ml 51.37 ml/m  RA Volume:   41.50 ml  18.54 ml/m  AORTIC VALVE AV Area (Vmax):    0.99 cm AV Area (Vmean):   0.98 cm AV Area (VTI):     0.96 cm AV Vmax:           292.98 cm/s AV Vmean:          200.557 cm/s AV VTI:            0.526 m AV Peak Grad:      34.3 mmHg AV Mean Grad:      20.3 mmHg LVOT Vmax:         64.30 cm/s LVOT Vmean:        43.400 cm/s LVOT VTI:          0.111 m LVOT/AV VTI ratio: 0.21  AORTA Ao Asc diam: 3.50 cm MITRAL  VALVE               TRICUSPID VALVE MV Area (PHT): 6.32 cm    TR Peak grad:   15.2 mmHg MV Decel Time: 120 msec    TR Vmax:        195.00 cm/s MV E velocity: 60.20 cm/s MV A velocity: 40.30 cm/s  SHUNTS MV E/A ratio:  1.49        Systemic VTI:  0.11 m                            Systemic Diam: 2.40 cm Darryle Decent MD Electronically signed by Darryle Decent MD Signature Date/Time: 07/31/2023/10:21:24 AM    Final     Anti-infectives: Anti-infectives (From admission, onward)    Start     Dose/Rate Route Frequency Ordered Stop   07/29/23 0800  piperacillin -tazobactam (ZOSYN ) IVPB 3.375 g  3.375 g 12.5 mL/hr over 240 Minutes Intravenous Every 8 hours 07/29/23 0207     07/29/23 0030  piperacillin -tazobactam (ZOSYN ) IVPB 3.375 g        3.375 g 100 mL/hr over 30 Minutes Intravenous  Once 07/29/23 0020 07/29/23 0147        Assessment/Plan Acute cholecystitis -plan for CLD today and NPO p MN tonight for OR tomorrow -patient is agreeable to proceed with surgery at this time -cont zosyn  -TB and AST/ALT slightly up today, but suspect this is secondary to the acute nature of his gallbladder. -d/w plan with primary service.   FEN - CLD, NPO p MN VTE - Eliquis  on hold ID - Zosyn   A fib AKI Peripheral neuropathy GERD HLD Hx of gastric bypass   I reviewed nursing notes, Consultant cards notes, hospitalist notes, last 24 h vitals and pain scores, last 48 h intake and output, last 24 h labs and trends, and last 24 h imaging results.   LOS: 2 days    Burnard [name] , [facility] 07/31/2023, 11:04 AM Please see Amion for pager number during day hours 7:00am-4:30pm or 7:00am -11:30am on weekends

## 2023-07-31 NOTE — Progress Notes (Signed)
 Patient Name: Francisco Padilla Date of Encounter: 07/31/2023 [facility] Cardiologist: Oneil Parchment, MD   Interval Summary  .    Denies anginal chest pain or heart failure symptoms. Tentatively scheduled for surgery tomorrow.  Vital Signs .    Vitals:   07/31/23 0113 07/31/23 0635 07/31/23 0717 07/31/23 1109  BP: 98/70 98/74 96/74  102/73  Pulse: (!) 109 100 97 100  Resp: 20 20 20 19   Temp: (!) 100.8 F (38.2 C) (!) 97.3 F (36.3 C) 97.7 F (36.5 C) 97.6 F (36.4 C)  TempSrc: Oral Oral Oral Oral  SpO2: 99% 97% 98% 97%  Weight:  93 kg    Height:        Intake/Output Summary (Last 24 hours) at 07/31/2023 1213 Last data filed at 07/31/2023 0900 Gross per 24 hour  Intake 660 ml  Output --  Net 660 ml      07/31/2023    6:35 AM 07/30/2023    4:21 AM 07/29/2023    2:08 PM  Last 3 Weights  Weight (lbs) 205 lb 0.4 oz 204 lb 9.4 oz 199 lb 4.7 oz  Weight (kg) 93 kg 92.8 kg 90.4 kg      Telemetry/ECG    Atrial fibrillation, rate controlled- Personally Reviewed  Echocardiogram 07/31/2023: 1. The aortic valve is severely calcified in views obtained. Vmax 2.9  m/s, MG 20 mmHG, AVA 0.96 cm2, DI 0.21. LV SVI low 22 cc/m2. Findings are  concerning for paradoxical low flow low gradient severe aortic stenosis.  If there are clinical concerns for  severe aortic stenosis, would recommend an aortic valve calcium  score for  clarification. The aortic valve is calcified. Aortic valve regurgitation  is not visualized. Moderate aortic valve stenosis. Aortic valve area, by  VTI measures 0.96 cm. Aortic  valve mean gradient measures 20.3 mmHg. Aortic valve Vmax measures 2.93  m/s.   2. Left ventricular ejection fraction, by estimation, is 60 to 65%. The  left ventricle has normal function. The left ventricle has no regional  wall motion abnormalities. Indeterminate diastolic filling due to E-A  fusion.   3. Right ventricular systolic function is mildly reduced. The right  ventricular  size is mildly enlarged. There is normal pulmonary artery  systolic pressure. The estimated right ventricular systolic pressure is  30.2 mmHg.   4. Left atrial size was moderately dilated.   5. Right atrial size was mildly dilated.   6. The mitral valve is grossly normal. Trivial mitral valve  regurgitation. No evidence of mitral stenosis.   7. The inferior vena cava is normal in size with greater than 50%  respiratory variability, suggesting right atrial pressure of 3 mmHg.   Physical Exam .    General: Age-appropriate male, in no acute distress, hemodynamically stable. HEENT: Normocephalic, atraumatic, no JVP, trachea midline, dry mucous membranes. Cardiac: Irregularly irregular, systolic ejection murmur at the second muscle space, no gallops or rubs. Lungs: Clear to auscultation bilaterally: No wheezes rales or rhonchi's. GI: Tender to touch, soft, no guarding, bowel sounds present. Extremities: Warm to touch, no pitting edema  Assessment & Plan .     Impression: Preoperative cardiovascular examination. Aortic stenosis, severe, paradoxical low-flow low gradient Permanent atrial fibrillation. Long-term anticoagulation. Acute cholecystitis. Chronic hypotension.  Recommendations: Patient was tentatively scheduled for laparoscopic cholecystectomy yesterday 07/30/2023 but the procedure was canceled as  he wanted to speak to cardiology with regards to anticoagulation management. Yesterday patient informed me that he has a history of GI bleed as  well as bleeding after nasal septal surgery. He has been off of his anticoagulation for greater than 48 hours, last dose is 07/27/2023 (per patient).  EKG is nonischemic. Clinically denies anginal chest pain or heart failure symptoms. Overall functional capacity is stable - he is able to perform at least 4 METS of activity without anginal chest pain. Echocardiogram was performed prior to surgery to reevaluate LVEF and the severity of aortic  stenosis. Patient is noted to have preserved LVEF.  However, the severity of aortic stenosis is severe, consistent with paradoxical low-flow low gradient. Would recommend avoiding hypotension during surgery. Patient is optimized from a cardiovascular standpoint; however, given his age, comorbidities, and paradoxical low-flow low gradient aortic stenosis he is considered to be at least moderate risk but this is not modifiable.  Patient is agreeable with his preoperative risk assessment.  His questions and concerns were addressed to his satisfaction.  Patient is advised to follow-up with his primary cardiologist once discharged for further evaluation and management of paradoxical low-flow low gradient aortic stenosis.  Patient's last dose of Eliquis  was July 27, 2023.  If surgery team is agreeable would recommend initiating IV heparin  per A-fib protocol and to hold prior to surgery to minimize the risk of bleeding.    Remains on broad-spectrum antibiotics, per primary team.  Cardiology will follow.  For questions or updates, please contact [facility] Please consult [email or web address] for contact info under     Signed, Madonna Large, DO, [facility]  [facility]  [address] [place], [place] [address] Pager: [phone] Office: [phone]

## 2023-07-31 NOTE — TOC Progression Note (Addendum)
 Transition of Care [facility]) - Progression Note    Patient Details  Name: Francisco Padilla MRN: [id number] Date of Birth: Mar 15, 1951  [facility] CM/SW Contact  [name], [place] Phone Number: 07/31/2023, 11:00 AM  Clinical Narrative:   CSW spoke with pt about PT recs for SNF. Pt states his first choice is [facility], second choice is [facility]. FL2 done at this time.   4:21PM: CSW called [facility] SN[facility] and left VM for admissions. Referral has been faxed to [facility] [phone]) and [facility] at this time.   TOC will continue to follow.     Expected Discharge Plan: Home w Home Health Services Barriers to Discharge: Continued Medical Work up  Expected Discharge Plan and Services In-house Referral: NA Discharge Planning Services: CM Consult Post Acute Care Choice: NA Living arrangements for the past 2 months: Single Family Home                 DME Arranged: N/A DME Agency: NA       HH Arranged: NA           Social Determinants of Health (SDOH) Interventions SDOH Screenings   Food Insecurity: No Food Insecurity (07/29/2023)  Housing: Low Risk  (07/29/2023)  Transportation Needs: No Transportation Needs (07/29/2023)  Utilities: Not At Risk (07/29/2023)  Social Connections: Patient Declined (07/31/2023)  Tobacco Use: Low Risk  (07/29/2023)    Readmission Risk Interventions     No data to display

## 2023-07-31 NOTE — Progress Notes (Signed)
 Mobility Specialist Progress Note:    07/31/23 1354  Mobility  Activity Stood at bedside;Ambulated with assistance in room  Level of Assistance Minimal assist, patient does 75% or more  Assistive Device Front wheel walker  Distance Ambulated (ft) 5 ft  Activity Response Tolerated well  Mobility Referral Yes  Mobility visit 1 Mobility  Mobility Specialist Start Time (ACUTE ONLY) 1035  Mobility Specialist Stop Time (ACUTE ONLY) 1050  Mobility Specialist Time Calculation (min) (ACUTE ONLY) 15 min   Pt received in bed requesting assistance using the BR. Pt needed MinA w/ bed mobility and supervision for STS. Void complete. Was able to take a couple steps by the bed. Situated back to supine in bed w/ call bell and personal belongings in reach.  Thersia Minder Mobility Specialist  Please contact vis Secure Chat or  Rehab Office [phone]

## 2023-07-31 NOTE — Progress Notes (Signed)
 PHARMACY - ANTICOAGULATION CONSULT NOTE  Pharmacy Consult for Heparin  Indication: atrial fibrillation  Allergies  Allergen Reactions   Aspirin     Avoid due to bariatric surgery   Bupropion Other (See Comments)    Passed out   Nsaids     Avoid due to bariatric surgery   Oxycodone  Hcl     Hallucinations, agitation     Patient Measurements: Height: 6' 4 (193 cm) Weight: 93 kg (205 lb 0.4 oz) IBW/kg (Calculated) : 86.8 Heparin  Dosing Weight: 93 kg  Vital Signs: Temp: 98 F (36.7 C) (01/07 1457) Temp Source: Oral (01/07 1457) BP: 101/77 (01/07 1457) Pulse Rate: 100 (01/07 1457)  Labs: Recent Labs    07/29/23 0459 07/30/23 0225 07/31/23 0255  HGB 12.4* 12.0* 11.5*  HCT 36.4* 36.1* 34.0*  PLT 187 180 175  CREATININE 1.10 0.74 0.73    Estimated Creatinine Clearance: 104 mL/min (by C-G formula based on SCr of 0.73 mg/dL).   Medical History: Past Medical History:  Diagnosis Date   ADD (attention deficit disorder)    Anxiety 2007   Asthma as child   Asymptomatic gallstones    Atrial fibrillation (HCC)    Atrial flutter (HCC)    Depression    DJD (degenerative joint disease) of knee    Dysrhythmia    Chronic Atrial Fib- seeing Dr. DOROTHA [facility]   H/O peptic ulcer    past history with GI bleed- cauterization done throught scope   History of kidney stones    pt claims urology discovered a kidney stone last week 05/25/21   Hypercholesterolemia    Orthostatic hypotension    Pneumonia as child   Pneumothorax on left 05/20/2009   Prediabetes    none now   Pulmonary nodules    Sleep apnea    cpap set on 13   Thrombocytopenia (HCC)    pt denies   Thyromegaly    Varicose veins     Medications:  Medications Prior to Admission  Medication Sig Dispense Refill Last Dose/Taking   acetaminophen  (TYLENOL ) 650 MG CR tablet Take 1,300 mg by mouth every 8 (eight) hours.   07/28/2023 Evening   alfuzosin  (UROXATRAL ) 10 MG 24 hr tablet Take 10 mg by mouth  daily with breakfast.   07/27/2023   amphetamine -dextroamphetamine  (ADDERALL) 5 MG tablet Take 5 mg by mouth daily as needed (for ADD).   Past Week   apixaban  (ELIQUIS ) 5 MG TABS tablet Take 5 mg by mouth 2 (two) times daily.   07/27/2023   atorvastatin  (LIPITOR) 20 MG tablet Take 20 mg by mouth at bedtime.   07/26/2023   cetirizine  (ZYRTEC ) 10 MG tablet Take 1 tablet (10 mg total) by mouth daily. 30 tablet 11 07/27/2023   Coenzyme Q10 (CO Q 10 PO) Take 1 capsule by mouth daily.   07/27/2023   desvenlafaxine (PRISTIQ) 50 MG 24 hr tablet Take 50 mg by mouth daily.   07/27/2023   fludrocortisone  (FLORINEF ) 0.1 MG tablet Take 0.1 mg by mouth daily.   07/27/2023   fluticasone  (FLONASE ) 50 MCG/ACT nasal spray Place 2 sprays into both nostrils daily. 16 g 6 07/27/2023   gabapentin  (NEURONTIN ) 300 MG capsule Take 2 capsules (600 mg total) by mouth at bedtime. 180 capsule 0 07/26/2023   Glucosamine-Chondroitin (COSAMIN DS PO) Take 1,500 mg by mouth daily. Rotate with other Joint Health   07/27/2023   latanoprost  (XALATAN ) 0.005 % ophthalmic solution Place 1 drop into both eyes at bedtime.   07/27/2023  LORazepam  (ATIVAN ) 1 MG tablet Take 1 mg by mouth every 4 (four) hours as needed.   07/27/2023   Melatonin 10 MG TABS Take 1 tablet by mouth at bedtime.   07/26/2023   Misc Natural Products (OSTEO BI-FLEX ADV TRIPLE ST PO) Take 1 tablet by mouth daily.   07/27/2023   Multiple Vitamins-Minerals (ONE DAILY MULTIVITAMIN MEN) TABS Take 1 tablet by mouth 2 (two) times daily. CENTRUM SILVER   07/27/2023   pantoprazole  (PROTONIX ) 40 MG tablet Take 40 mg by mouth in the morning and at bedtime.   07/27/2023   Probiotic Product (PROBIOTIC PO) Take 1-2 capsules by mouth daily.   07/27/2023   sildenafil (REVATIO) 20 MG tablet Take 20 mg by mouth daily as needed.   Taking As Needed   sodium chloride  (OCEAN) 0.65 % SOLN nasal spray Place 1 spray into both nostrils as needed. 30 mL 5 07/26/2023   SUPER B COMPLEX/C PO Take 1 tablet by mouth daily.    07/27/2023   zolpidem  (AMBIEN ) 10 MG tablet Take 5-10 mg by mouth at bedtime.  0 07/26/2023    Assessment: 73 y.o. M presents with acute cholecystitis. Pt on Eliquis  pta for PAF. Last dose given 1/3. Holding for cholecystectomy. Plan to bridge with heparin  overnight - heparin  to stop 1/8 0700. Will use aPTT to monitor heparin  for now as Eliquis  likely affecting heparin  level.  Goal of Therapy:  Heparin  level 0.3-0.7 units/ml aPTT 66-102 seconds Monitor platelets by anticoagulation protocol: Yes   Plan:  Heparin  gtt at 1300 units/hr No bolus. Heparin  to stop 1/8 0700. Will f/u aPTT and heparin  level in 8 hours F/u [facility] plans post OR  Vito Ralph, PharmD, BCPS Please see amion for complete clinical pharmacist phone list 07/31/2023,3:37 PM

## 2023-07-31 NOTE — Telephone Encounter (Signed)
 Pt has been seen by Dr Odis Hollingshead in hospital.  Please see that note for further documentation.

## 2023-07-31 NOTE — Plan of Care (Signed)
 Care plan reviewed.

## 2023-07-31 NOTE — [place] FL2 (Signed)
 [place]  MEDICAID FL2 LEVEL OF CARE FORM     IDENTIFICATION  Patient Name: Francisco Padilla Birthdate: [birth year] Sex: male Admission Date (Current Location): 07/28/2023  [facility] and [place] Number:  Producer, Television/film/video and Address:  The [place], [address] [place], [place] [address]      Provider Number: [id number]  Attending Physician Name and Address:  Bryn Bernardino NOVAK, MD  Relative Name and Phone Number:       Current Level of Care: Hospital Recommended Level of Care: Skilled Nursing Facility Prior Approval Number:    Date Approved/Denied:   PASRR Number: [id number]  Discharge Plan: SNF    Current Diagnoses: Patient Active Problem List   Diagnosis Date Noted   Aortic stenosis, severe 07/31/2023   Acute cholecystitis 07/29/2023   Hepatitis 07/29/2023   AKI (acute kidney injury) (HCC) 07/29/2023   GAD (generalized anxiety disorder) 07/29/2023   Sepsis (HCC) 07/29/2023   Memory loss 02/28/2023   S/P bariatric surgery 02/28/2023   Total knee replacement status, 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   Unilateral primary osteoarthritis, left knee    Longstanding persistent atrial fibrillation (HCC) 04/26/2021   Preop cardiovascular exam 04/26/2021   History of chronic hypotension 02/20/2019   Current use of long term anticoagulation 03/05/2015   Abnormal chest x-ray with multiple lung nodules 12/14/2014   AS (aortic stenosis) 12/14/2014   Atrial flutter by electrocardiogram (HCC) 12/14/2014   CAD (coronary artery disease) 12/14/2014   DJD (degenerative joint disease) 12/14/2014   History of GI bleed 12/14/2014   Nephrolithiasis 12/14/2014   Other activity(E029.9) 12/14/2014   Prediabetes 12/14/2014   Thrombocytopenia (HCC) 12/14/2014   Arterial blood pressure decreased 10/28/2014   Atrial fibrillation (HCC) 09/02/2014   Morbid obesity (HCC) 09/02/2014   Paroxysmal atrial fibrillation (HCC) 08/03/2014   Gastroduodenal ulcer 08/03/2014   Apnea, sleep  08/03/2014   Glaucoma suspect 02/21/2013   Glaucoma suspect of both eyes 02/21/2013   Herpes 04/11/2012   Routine general medical examination at a health care facility 04/11/2012   Hyperlipidemia 05/31/2009   Depression 05/31/2009   Attention deficit disorder 05/31/2009   OSA on CPAP 05/31/2009   GERD 05/31/2009   GOUT, HX OF 05/31/2009    Orientation RESPIRATION BLADDER Height & Weight     Self, Situation, Time, Place  Normal Continent (pericare) Weight: 205 lb 0.4 oz (93 kg) Height:  6' 4 (193 cm)  BEHAVIORAL SYMPTOMS/MOOD NEUROLOGICAL BOWEL NUTRITION STATUS      Continent Diet (see dc summary)  AMBULATORY STATUS COMMUNICATION OF NEEDS Skin   Limited Assist Verbally Normal                       Personal Care Assistance Level of Assistance  Bathing, Feeding, Dressing Bathing Assistance: Limited assistance Feeding assistance: Independent Dressing Assistance: Limited assistance     Functional Limitations Info  Sight, Hearing, Speech Sight Info: Adequate Hearing Info: Adequate Speech Info: Adequate    SPECIAL CARE FACTORS FREQUENCY  PT (By licensed PT), OT (By licensed OT)     PT Frequency: 5x week OT Frequency: 5x week            Contractures Contractures Info: Not present    Additional Factors Info  Code Status, Allergies Code Status Info: FULL Allergies Info: Aspirin,  Bupropion, Nsaids, Oxycodone  Hcl           Current Medications (07/31/2023):  This is the current hospital active medication list Current Facility-Administered Medications  Medication Dose  Route Frequency Provider Last Rate Last Admin   acetaminophen  (TYLENOL ) tablet 650 mg  650 mg Oral Q6H PRN Sundil, Subrina, MD   650 mg at 07/29/23 2246   Or   acetaminophen  (TYLENOL ) suppository 650 mg  650 mg Rectal Q6H PRN Sundil, Subrina, MD       acidophilus (RISAQUAD) capsule 1 capsule  1 capsule Oral Daily Bryn Bernardino NOVAK, MD   1 capsule at 07/31/23 1023   alfuzosin  (UROXATRAL ) 24 hr tablet  10 mg  10 mg Oral Q breakfast Bryn Bernardino NOVAK, MD   10 mg at 07/31/23 9178   fludrocortisone  (FLORINEF ) tablet 0.1 mg  0.1 mg Oral Daily Sundil, Subrina, MD   0.1 mg at 07/31/23 1024   gabapentin  (NEURONTIN ) capsule 600 mg  600 mg Oral QHS Bryn Bernardino NOVAK, MD   600 mg at 07/30/23 2033   heparin  ADULT infusion 100 units/mL (25000 units/250mL)  1,300 Units/hr Intravenous Continuous Hershal Vito MATSU, RPH 13 mL/hr at 07/31/23 1600 1,300 Units/hr at 07/31/23 1600   latanoprost  (XALATAN ) 0.005 % ophthalmic solution 1 drop  1 drop Both Eyes QHS Grunz, Ryan B, MD   1 drop at 07/30/23 2035   LORazepam  (ATIVAN ) tablet 1 mg  1 mg Oral Q4H PRN Sundil, Subrina, MD   1 mg at 07/31/23 1024   multivitamin (PROSIGHT) tablet 1 tablet  1 tablet Oral Daily Bryn Bernardino NOVAK, MD   1 tablet at 07/31/23 1023   pantoprazole  (PROTONIX ) EC tablet 40 mg  40 mg Oral Daily Sundil, Subrina, MD   40 mg at 07/31/23 1024   piperacillin -tazobactam (ZOSYN ) IVPB 3.375 g  3.375 g Intravenous Q8H Duayne, Brideau, RPH 12.5 mL/hr at 07/31/23 1554 3.375 g at 07/31/23 1554   senna-docusate (Senokot-S) tablet 1 tablet  1 tablet Oral QHS PRN Sundil, Subrina, MD       sodium chloride  flush (NS) 0.9 % injection 3 mL  3 mL Intravenous Q12H Sundil, Subrina, MD   3 mL at 07/31/23 1024   traMADol  (ULTRAM ) tablet 50-100 mg  50-100 mg Oral Q12H PRN Bryn Bernardino NOVAK, MD   100 mg at 07/31/23 1024   venlafaxine  XR (EFFEXOR -XR) 24 hr capsule 37.5 mg  37.5 mg Oral Q breakfast Sundil, Subrina, MD   37.5 mg at 07/31/23 9178   zolpidem  (AMBIEN ) tablet 5 mg  5 mg Oral QHS PRN Sundil, Subrina, MD   5 mg at 07/29/23 2245     Discharge Medications: Please see discharge summary for a list of discharge medications.  Relevant Imaging Results:  Relevant Lab Results:   Additional Information SSN: [id number]  Francisco Padilla, LCSWA

## 2023-07-31 NOTE — Progress Notes (Signed)
 [facility] PROGRESS NOTE  Francisco Padilla (DOB: 10-12-1950) FMW:[id number] PCP: Francisco Madden, MD  Brief Narrative: Francisco Padilla is a 73 y.o. 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 Cardiology is consulted for chronic AFib and anticoagulation recommendations. General surgery is currently recommending laparoscopic cholecystectomy.   Subjective: He's tired, and has stable right abdominal pain but has no other complaints. He's amenable to heparin  if recommended by cardiology and to cholecystectomy which may happen this afternoon. Has been sleeping with his CPAP.   Objective: BP 96/74 (BP Location: Left Arm)   Pulse 97   Temp 97.7 F (36.5 C) (Oral)   Resp 20   Ht 6' 4 (1.93 m)   Wt 93 kg   SpO2 98%   BMI 24.96 kg/m   Gen: No distress, tall Pulm: Clear, nonlabored  CV: Irreg irreg, rate in 90-100's, soft systolic murmur at base, no edema GI: Soft, tender in RUQ predominantly, no rebound, +BS, not distended Neuro: Alert and oriented. No new focal deficits. Ext: Warm, no deformities. Skin: No new rashes, lesions or ulcers on visualized skin   Assessment & Plan: Sepsis due to acute cholecystitis: Hepatitis panel negative.  - Continue zosyn , WBC coming down, though still quite tender, and LFTs starting to rise.   - Follow up blood cultures - Surgery following, allowing eliquis  washout, current plan is for laparoscopic cholecystectomy 1/7. - Continue pain control.  - Defer diet to surgery (currently CLD)  Chronic AFib: Afib on admission, remains rate controlled.   - Monitor tele - Holding eliquis , CHA2DS2-VASc score is 2. Defer need for heparinization to cardiology.  - Not on rate control agent at baseline.  - Echocardiogram  performed, read pending, ordered per cardiology.   AKI: Improving Cr 1.51 > 0.7 which is baseline and CrCl >49ml/min, remains stable. FENa was low. - Avoid hypotension. No further IVF unless patient will be NPO for extended duration  Insomnia:  - No changes to home regimen  Peripheral neuropathy:  - Continue gabapentin   Abnormal hepatic appearance by CT: Hepatitis panel negative - Hold statin  Hypokalemia:  - Supplement again today with Mg, monitor.   Chronic hypotension:  - Continue florinef    Francisco KATHEE Come, MD [facility] [email or web address] 07/31/2023, 10:48 AM

## 2023-08-01 ENCOUNTER — Encounter (HOSPITAL_COMMUNITY): Payer: Self-pay | Admitting: Internal Medicine

## 2023-08-01 ENCOUNTER — Other Ambulatory Visit: Payer: Self-pay

## 2023-08-01 ENCOUNTER — Inpatient Hospital Stay (HOSPITAL_COMMUNITY): Payer: Medicare Other | Admitting: Anesthesiology

## 2023-08-01 ENCOUNTER — Inpatient Hospital Stay (HOSPITAL_COMMUNITY): Payer: Medicare Other

## 2023-08-01 ENCOUNTER — Encounter (HOSPITAL_COMMUNITY)
Admission: EM | Disposition: A | Discharge status: Skilled Nursing Facility | Payer: Self-pay | Source: Home / Self Care | Attending: Internal Medicine

## 2023-08-01 ENCOUNTER — Ambulatory Visit: Payer: Medicare Other | Admitting: Sports Medicine

## 2023-08-01 DIAGNOSIS — I9589 Other hypotension: Secondary | ICD-10-CM | POA: Insufficient documentation

## 2023-08-01 DIAGNOSIS — K819 Cholecystitis, unspecified: Secondary | ICD-10-CM | POA: Diagnosis not present

## 2023-08-01 DIAGNOSIS — G4733 Obstructive sleep apnea (adult) (pediatric): Secondary | ICD-10-CM

## 2023-08-01 DIAGNOSIS — I2510 Atherosclerotic heart disease of native coronary artery without angina pectoris: Secondary | ICD-10-CM | POA: Diagnosis not present

## 2023-08-01 DIAGNOSIS — J45909 Unspecified asthma, uncomplicated: Secondary | ICD-10-CM | POA: Diagnosis not present

## 2023-08-01 DIAGNOSIS — K810 Acute cholecystitis: Secondary | ICD-10-CM | POA: Diagnosis not present

## 2023-08-01 HISTORY — PX: CHOLECYSTECTOMY: SHX55

## 2023-08-01 LAB — CBC
HCT: 33.9 % — ABNORMAL LOW (ref 39.0–52.0)
HCT: 34.9 % — ABNORMAL LOW (ref 39.0–52.0)
Hemoglobin: 11.4 g/dL — ABNORMAL LOW (ref 13.0–17.0)
Hemoglobin: 11.6 g/dL — ABNORMAL LOW (ref 13.0–17.0)
MCH: 30.9 pg (ref 26.0–34.0)
MCH: 30.9 pg (ref 26.0–34.0)
MCHC: 33.6 g/dL (ref 30.0–36.0)
MCHC: 33.2 g/dL (ref 30.0–36.0)
MCV: 91.9 fL (ref 80.0–100.0)
MCV: 93.1 fL (ref 80.0–100.0)
Platelets: 167 10*3/uL (ref 150–400)
Platelets: 165 10*3/uL (ref 150–400)
RBC: 3.69 MIL/uL — ABNORMAL LOW (ref 4.22–5.81)
RBC: 3.75 MIL/uL — ABNORMAL LOW (ref 4.22–5.81)
RDW: 13.1 % (ref 11.5–15.5)
RDW: 13.1 % (ref 11.5–15.5)
WBC: 13.3 10*3/uL — ABNORMAL HIGH (ref 4.0–10.5)
WBC: 12.4 10*3/uL — ABNORMAL HIGH (ref 4.0–10.5)
nRBC: 0 % (ref 0.0–0.2)
nRBC: 0 % (ref 0.0–0.2)

## 2023-08-01 LAB — COMPREHENSIVE METABOLIC PANEL
ALT: 53 U/L — ABNORMAL HIGH (ref 0–44)
ALT: 46 U/L — ABNORMAL HIGH (ref 0–44)
AST: 43 U/L — ABNORMAL HIGH (ref 15–41)
AST: 34 U/L (ref 15–41)
Albumin: 2.2 g/dL — ABNORMAL LOW (ref 3.5–5.0)
Albumin: 2.5 g/dL — ABNORMAL LOW (ref 3.5–5.0)
Alkaline Phosphatase: 78 U/L (ref 38–126)
Alkaline Phosphatase: 73 U/L (ref 38–126)
Anion gap: 10 (ref 5–15)
Anion gap: 11 (ref 5–15)
BUN: 18 mg/dL (ref 8–23)
BUN: 19 mg/dL (ref 8–23)
CO2: 23 mmol/L (ref 22–32)
CO2: 20 mmol/L — ABNORMAL LOW (ref 22–32)
Calcium: 8.8 mg/dL — ABNORMAL LOW (ref 8.9–10.3)
Calcium: 8.8 mg/dL — ABNORMAL LOW (ref 8.9–10.3)
Chloride: 99 mmol/L (ref 98–111)
Chloride: 104 mmol/L (ref 98–111)
Creatinine, Ser: 0.67 mg/dL (ref 0.61–1.24)
Creatinine, Ser: 0.75 mg/dL (ref 0.61–1.24)
GFR, Estimated: >60 mL/min (ref >60)
GFR, Estimated: >60 mL/min (ref >60)
Glucose, Bld: 107 mg/dL — ABNORMAL HIGH (ref 70–99)
Glucose, Bld: 146 mg/dL — ABNORMAL HIGH (ref 70–99)
Potassium: 3.4 mmol/L — ABNORMAL LOW (ref 3.5–5.1)
Potassium: 4 mmol/L (ref 3.5–5.1)
Sodium: 132 mmol/L — ABNORMAL LOW (ref 135–145)
Sodium: 135 mmol/L (ref 135–145)
Total Bilirubin: 1.4 mg/dL — ABNORMAL HIGH (ref 0.0–1.2)
Total Bilirubin: 1.3 mg/dL — ABNORMAL HIGH (ref 0.0–1.2)
Total Protein: 5.3 g/dL — ABNORMAL LOW (ref 6.5–8.1)
Total Protein: 5.5 g/dL — ABNORMAL LOW (ref 6.5–8.1)

## 2023-08-01 LAB — TYPE AND SCREEN
ABO/RH(D): O POS
Antibody Screen: NEGATIVE

## 2023-08-01 LAB — MAGNESIUM
Magnesium: 1.8 mg/dL (ref 1.7–2.4)
Magnesium: 1.7 mg/dL (ref 1.7–2.4)

## 2023-08-01 LAB — GLUCOSE, CAPILLARY
Glucose-Capillary: 132 mg/dL — ABNORMAL HIGH (ref 70–99)
Glucose-Capillary: 141 mg/dL — ABNORMAL HIGH (ref 70–99)
Glucose-Capillary: 131 mg/dL — ABNORMAL HIGH (ref 70–99)

## 2023-08-01 LAB — HEPARIN LEVEL (UNFRACTIONATED): Heparin Unfractionated: 0.29 [IU]/mL — ABNORMAL LOW (ref 0.30–0.70)

## 2023-08-01 LAB — APTT
aPTT: 50 s — ABNORMAL HIGH (ref 24–36)
aPTT: 40 s — ABNORMAL HIGH (ref 24–36)

## 2023-08-01 LAB — PROTIME-INR
INR: 1.3 — ABNORMAL HIGH (ref 0.8–1.2)
Prothrombin Time: 16.7 s — ABNORMAL HIGH (ref 11.4–15.2)

## 2023-08-01 SURGERY — LAPAROSCOPIC CHOLECYSTECTOMY
Anesthesia: General | Site: Abdomen

## 2023-08-01 MED ORDER — BUPIVACAINE-EPINEPHRINE (PF) 0.25% -1:200000 IJ SOLN
INTRAMUSCULAR | Status: DC | PRN
  Administered 2023-08-01: 16 mL

## 2023-08-01 MED ORDER — LIDOCAINE 2% (20 MG/ML) 5 ML SYRINGE
INTRAMUSCULAR | Status: DC | PRN
  Administered 2023-08-01: 60 mg via INTRAVENOUS

## 2023-08-01 MED ORDER — PHENYLEPHRINE HCL-NACL 20-0.9 MG/250ML-% IV SOLN
INTRAVENOUS | Status: DC | PRN
  Administered 2023-08-01: 25 ug/min via INTRAVENOUS

## 2023-08-01 MED ORDER — CHLORHEXIDINE GLUCONATE CLOTH 2 % EX PADS
6.0000 | MEDICATED_PAD | Freq: Once | CUTANEOUS | Status: DC
Start: 2023-08-01 — End: 2023-08-01

## 2023-08-01 MED ORDER — INDOCYANINE GREEN 25 MG IV SOLR
2.5000 mg | Freq: Once | INTRAVENOUS | Status: AC
  Administered 2023-08-01: 2.5 mg via INTRAVENOUS
  Filled 2023-08-01 (×2): qty 10

## 2023-08-01 MED ORDER — MIDAZOLAM HCL 2 MG/2ML IJ SOLN
INTRAMUSCULAR | Status: AC
  Filled 2023-08-01: qty 2

## 2023-08-01 MED ORDER — ACETAMINOPHEN 500 MG PO TABS
ORAL_TABLET | ORAL | Status: AC
  Administered 2023-08-01: 1000 mg via ORAL
  Filled 2023-08-01: qty 2

## 2023-08-01 MED ORDER — PROPOFOL 10 MG/ML IV BOLUS
INTRAVENOUS | Status: AC
  Filled 2023-08-01: qty 20

## 2023-08-01 MED ORDER — DEXAMETHASONE SODIUM PHOSPHATE 10 MG/ML IJ SOLN
INTRAMUSCULAR | Status: DC | PRN
  Administered 2023-08-01: 10 mg via INTRAVENOUS

## 2023-08-01 MED ORDER — 0.9 % SODIUM CHLORIDE (POUR BTL) OPTIME
TOPICAL | Status: DC | PRN
  Administered 2023-08-01: 1000 mL

## 2023-08-01 MED ORDER — PHENYLEPHRINE 80 MCG/ML (10ML) SYRINGE FOR IV PUSH (FOR BLOOD PRESSURE SUPPORT)
PREFILLED_SYRINGE | INTRAVENOUS | Status: DC | PRN
  Administered 2023-08-01 (×7): 80 ug via INTRAVENOUS

## 2023-08-01 MED ORDER — PROPOFOL 10 MG/ML IV BOLUS
INTRAVENOUS | Status: DC | PRN
  Administered 2023-08-01: 140 mg via INTRAVENOUS

## 2023-08-01 MED ORDER — FENTANYL CITRATE (PF) 250 MCG/5ML IJ SOLN
INTRAMUSCULAR | Status: DC | PRN
  Administered 2023-08-01 (×2): 50 ug via INTRAVENOUS

## 2023-08-01 MED ORDER — ROCURONIUM BROMIDE 10 MG/ML (PF) SYRINGE
PREFILLED_SYRINGE | INTRAVENOUS | Status: DC | PRN
  Administered 2023-08-01: 50 mg via INTRAVENOUS
  Administered 2023-08-01: 30 mg via INTRAVENOUS

## 2023-08-01 MED ORDER — ORAL CARE MOUTH RINSE: 15.0000 mL | Freq: Once | OROMUCOSAL | Status: AC

## 2023-08-01 MED ORDER — CHLORHEXIDINE GLUCONATE CLOTH 2 % EX PADS
6.0000 | MEDICATED_PAD | Freq: Every day | CUTANEOUS | Status: DC
  Administered 2023-08-01 – 2023-08-25 (×25): 6 via TOPICAL

## 2023-08-01 MED ORDER — LACTATED RINGERS IV SOLN: INTRAVENOUS | Status: DC

## 2023-08-01 MED ORDER — LATANOPROST 0.005 % OP SOLN
1.0000 [drp] | Freq: Every day | OPHTHALMIC | Status: DC
Start: 2023-08-01 — End: 2023-08-01

## 2023-08-01 MED ORDER — ALBUMIN HUMAN 5 % IV SOLN: INTRAVENOUS | Status: DC | PRN

## 2023-08-01 MED ORDER — CHLORHEXIDINE GLUCONATE 0.12 % MT SOLN
OROMUCOSAL | Status: AC
  Administered 2023-08-01: 15 mL via OROMUCOSAL
  Filled 2023-08-01: qty 15

## 2023-08-01 MED ORDER — ACETAMINOPHEN 500 MG PO TABS: 1000.0000 mg | ORAL_TABLET | Freq: Once | ORAL | Status: AC

## 2023-08-01 MED ORDER — DEXAMETHASONE SODIUM PHOSPHATE 10 MG/ML IJ SOLN
INTRAMUSCULAR | Status: AC
  Filled 2023-08-01: qty 1

## 2023-08-01 MED ORDER — CHLORHEXIDINE GLUCONATE CLOTH 2 % EX PADS: 6.0000 | MEDICATED_PAD | Freq: Once | CUTANEOUS | Status: DC

## 2023-08-01 MED ORDER — LACTATED RINGERS IV SOLN
INTRAVENOUS | Status: AC
Start: 2023-08-01 — End: 2023-08-02

## 2023-08-01 MED ORDER — BUPIVACAINE-EPINEPHRINE (PF) 0.25% -1:200000 IJ SOLN
INTRAMUSCULAR | Status: AC
  Filled 2023-08-01: qty 30

## 2023-08-01 MED ORDER — POTASSIUM CHLORIDE 10 MEQ/100ML IV SOLN
10.0000 meq | INTRAVENOUS | Status: AC
  Administered 2023-08-01 (×4): 10 meq via INTRAVENOUS
  Filled 2023-08-01 (×4): qty 100

## 2023-08-01 MED ORDER — PHENYLEPHRINE 80 MCG/ML (10ML) SYRINGE FOR IV PUSH (FOR BLOOD PRESSURE SUPPORT)
PREFILLED_SYRINGE | INTRAVENOUS | Status: AC
Start: 2023-08-01 — End: ?
  Filled 2023-08-01: qty 40

## 2023-08-01 MED ORDER — ONDANSETRON HCL 4 MG/2ML IJ SOLN
INTRAMUSCULAR | Status: DC | PRN
  Administered 2023-08-01: 4 mg via INTRAVENOUS

## 2023-08-01 MED ORDER — PHENYLEPHRINE 80 MCG/ML (10ML) SYRINGE FOR IV PUSH (FOR BLOOD PRESSURE SUPPORT)
PREFILLED_SYRINGE | INTRAVENOUS | Status: AC
  Filled 2023-08-01: qty 10

## 2023-08-01 MED ORDER — SUGAMMADEX SODIUM 200 MG/2ML IV SOLN
INTRAVENOUS | Status: DC | PRN
  Administered 2023-08-01: 200 mg via INTRAVENOUS

## 2023-08-01 MED ORDER — ESMOLOL HCL 100 MG/10ML IV SOLN
INTRAVENOUS | Status: DC | PRN
  Administered 2023-08-01 (×3): 20 mg via INTRAVENOUS
  Administered 2023-08-01: 40 mg via INTRAVENOUS

## 2023-08-01 MED ORDER — FENTANYL CITRATE (PF) 250 MCG/5ML IJ SOLN
INTRAMUSCULAR | Status: AC
  Filled 2023-08-01: qty 5

## 2023-08-01 MED ORDER — SODIUM CHLORIDE 0.9 % IR SOLN
Status: DC | PRN
  Administered 2023-08-01: 1

## 2023-08-01 MED ORDER — CHLORHEXIDINE GLUCONATE 0.12 % MT SOLN: 15.0000 mL | Freq: Once | OROMUCOSAL | Status: AC

## 2023-08-01 MED ORDER — ONDANSETRON HCL 4 MG/2ML IJ SOLN
INTRAMUSCULAR | Status: AC
  Filled 2023-08-01: qty 2

## 2023-08-01 MED ORDER — MIDAZOLAM HCL 2 MG/2ML IJ SOLN
INTRAMUSCULAR | Status: DC | PRN
  Administered 2023-08-01: 2 mg via INTRAVENOUS

## 2023-08-01 SURGICAL SUPPLY — 44 items
APPLIER CLIP ROT 10 11.4 M/L (STAPLE) ×1 IMPLANT
BAG COUNTER SPONGE SURGICOUNT (BAG) ×1 IMPLANT
CANISTER SUCT 3000ML PPV (MISCELLANEOUS) ×1 IMPLANT
CHLORAPREP W/TINT 26 (MISCELLANEOUS) ×1 IMPLANT
CLIP APPLIE ROT 10 11.4 M/L (STAPLE) ×1 IMPLANT
COVER SURGICAL LIGHT HANDLE (MISCELLANEOUS) ×1 IMPLANT
DERMABOND ADVANCED .7 DNX12 (GAUZE/BANDAGES/DRESSINGS) ×1 IMPLANT
DRAIN CHANNEL 19F RND (DRAIN) IMPLANT
ELECT REM PT RETURN 9FT ADLT (ELECTROSURGICAL) ×1 IMPLANT
ELECTRODE REM PT RTRN 9FT ADLT (ELECTROSURGICAL) ×1 IMPLANT
ENDOLOOP SUT PDS II 0 18 (SUTURE) IMPLANT
EVACUATOR SILICONE 100CC (DRAIN) IMPLANT
GLOVE BIO SURGEON STRL SZ7 (GLOVE) ×1 IMPLANT
GOWN STRL REUS W/ TWL LRG LVL3 (GOWN DISPOSABLE) ×2 IMPLANT
GOWN STRL REUS W/ TWL XL LVL3 (GOWN DISPOSABLE) ×1 IMPLANT
GRASPER SUT TROCAR 14GX15 (MISCELLANEOUS) ×1 IMPLANT
IRRIG SUCT STRYKERFLOW 2 WTIP (MISCELLANEOUS) ×1 IMPLANT
IRRIGATION SUCT STRKRFLW 2 WTP (MISCELLANEOUS) ×1 IMPLANT
KIT BASIN OR (CUSTOM PROCEDURE TRAY) ×1 IMPLANT
KIT IMAGING PINPOINTPAQ (MISCELLANEOUS) IMPLANT
KIT TURNOVER KIT B (KITS) ×1 IMPLANT
L-HOOK LAP DISP 36CM (ELECTROSURGICAL) ×1 IMPLANT
LHOOK LAP DISP 36CM (ELECTROSURGICAL) ×1 IMPLANT
NDL 22X1.5 STRL (OR ONLY) (MISCELLANEOUS) ×1 IMPLANT
NDL INSUFFLATION 14GA 120MM (NEEDLE) ×1 IMPLANT
NEEDLE 22X1.5 STRL (OR ONLY) (MISCELLANEOUS) ×1 IMPLANT
NEEDLE INSUFFLATION 14GA 120MM (NEEDLE) ×1 IMPLANT
NS IRRIG 1000ML POUR BTL (IV SOLUTION) ×1 IMPLANT
PAD ARMBOARD 7.5X6 YLW CONV (MISCELLANEOUS) ×1 IMPLANT
PENCIL BUTTON HOLSTER BLD 10FT (ELECTRODE) ×1 IMPLANT
POUCH RETRIEVAL ECOSAC 10 (ENDOMECHANICALS) ×1 IMPLANT
SCISSORS LAP 5X35 DISP (ENDOMECHANICALS) ×1 IMPLANT
SET TUBE SMOKE EVAC HIGH FLOW (TUBING) ×1 IMPLANT
SLEEVE Z-THREAD 5X100MM (TROCAR) ×2 IMPLANT
SUT ETHILON 2 0 FS 18 (SUTURE) IMPLANT
SUT MNCRL AB 4-0 PS2 18 (SUTURE) ×1 IMPLANT
SUT VICRYL 0 UR6 27IN ABS (SUTURE) IMPLANT
TOWEL GREEN STERILE (TOWEL DISPOSABLE) ×1 IMPLANT
TOWEL GREEN STERILE FF (TOWEL DISPOSABLE) ×1 IMPLANT
TRAY LAPAROSCOPIC MC (CUSTOM PROCEDURE TRAY) ×1 IMPLANT
TROCAR Z THREAD OPTICAL 12X100 (TROCAR) ×1 IMPLANT
TROCAR Z-THREAD OPTICAL 5X100M (TROCAR) ×1 IMPLANT
WARMER LAPAROSCOPE (MISCELLANEOUS) ×1 IMPLANT
WATER STERILE IRR 1000ML POUR (IV SOLUTION) ×1 IMPLANT

## 2023-08-01 NOTE — Progress Notes (Addendum)
 eLink Physician-Brief Progress Note Patient Name: NICLAS MARKELL DOB: 1951/04/05 MRN: [id number]   Date of Service  08/01/2023  HPI/Events of Note  73 year old male with pertinent PMH OSA on CPAP, HLD, chronic hypotension, PAF on Eliquis , PVD presents to [facility] ED on 1/5 with cholecystitis.  Return from the OR to ICU with labile blood pressures.  Bladder scan postop 640.  eICU Interventions  In-N-Out cath as needed per urinary retention guidelines   0236 - Add Dilaudid  PO x1 for refractory pain   Intervention Category Minor Interventions: Routine modifications to care plan (e.g. PRN medications for pain, fever)  Devon Pretty 08/01/2023, 8:10 PM

## 2023-08-01 NOTE — Progress Notes (Signed)
 Mobility Specialist Progress Note:   08/01/23 1045  Mobility  Activity Ambulated with assistance in hallway  Level of Assistance Minimal assist, patient does 75% or more  Assistive Device Front wheel walker  Distance Ambulated (ft) 200 ft  Activity Response Tolerated well  Mobility Referral Yes  Mobility visit 1 Mobility  Mobility Specialist Start Time (ACUTE ONLY) 1045  Mobility Specialist Stop Time (ACUTE ONLY) 1102  Mobility Specialist Time Calculation (min) (ACUTE ONLY) 17 min   Pt asleep on arrival, agreeable to mobility session when awake. Increased assistance needed for mobility today d/t unsteadiness/scissoring feet. When asked pt if he lost his balance, pt said I'm dancing. Pt c/o lethargy this am. Back in bed with all needs met, alarm on.   Therisa Rana Mobility Specialist Please contact via SecureChat or  Rehab office at [phone]

## 2023-08-01 NOTE — Plan of Care (Signed)

## 2023-08-01 NOTE — Progress Notes (Signed)
 PT Cancellation Note  Patient Details Name: Francisco Padilla MRN: [id number] DOB: July 21, 1951   Cancelled Treatment:    Reason Eval/Treat Not Completed: Patient at procedure or test/unavailable (Patient off unit at OR for lap chole w/ possible IOC. Will follow up at later date/time as pt is able and schedule allows.)    Vernell DONEEN KLEIN, DPT Acute Rehabilitation Services Office [phone]  08/01/23 1:53 PM

## 2023-08-01 NOTE — Progress Notes (Signed)
 Subjective: Resting comfortably. Denies new complaints.   ROS: See above, otherwise other systems negative  Objective: Vital signs in last 24 hours: Temp:  [97.6 F (36.4 C)-99.4 F (37.4 C)] 99.4 F (37.4 C) (01/07 1929) Pulse Rate:  [95-102] 102 (01/08 0107) Resp:  [16-19] 16 (01/08 0600) BP: (96-102)/(68-77) 96/68 (01/08 0107) SpO2:  [97 %-98 %] 97 % (01/08 0107) Last BM Date : 07/31/23  Intake/Output from previous day: 01/07 0701 - 01/08 0700 In: 1240 [P.O.:1240] Out: -  Intake/Output this shift: No intake/output data recorded.  PE: Gen: NAD Abd: soft, tender in RUQ, ND, +BS  Lab Results:  Recent Labs    07/31/23 0255 08/01/23 0303  WBC 15.5* 13.3*  HGB 11.5* 11.4*  HCT 34.0* 33.9*  PLT 175 167   BMET Recent Labs    07/31/23 0255 08/01/23 0303  NA 135 132*  K 3.4* 3.4*  CL 104 99  CO2 22 23  GLUCOSE 105* 107*  BUN 20 18  CREATININE 0.73 0.67  CALCIUM  8.9 8.8*   PT/INR No results for input(s): LABPROT, INR in the last 72 hours. CMP     Component Value Date/Time   NA 132 (L) 08/01/2023 0303   K 3.4 (L) 08/01/2023 0303   CL 99 08/01/2023 0303   CO2 23 08/01/2023 0303   GLUCOSE 107 (H) 08/01/2023 0303   BUN 18 08/01/2023 0303   CREATININE 0.67 08/01/2023 0303   CREATININE 0.77 07/19/2023 1128   CALCIUM  8.8 (L) 08/01/2023 0303   PROT 5.3 (L) 08/01/2023 0303   ALBUMIN  2.2 (L) 08/01/2023 0303   AST 43 (H) 08/01/2023 0303   ALT 53 (H) 08/01/2023 0303   ALKPHOS 78 08/01/2023 0303   BILITOT 1.4 (H) 08/01/2023 0303   GFRNONAA >60 08/01/2023 0303   GFRAA  10/23/2009 0135    >60        The eGFR has been calculated using the MDRD equation. This calculation has not been validated in all clinical situations. eGFR's persistently <60 mL/min signify possible Chronic Kidney Disease.   Lipase     Component Value Date/Time   LIPASE 29 07/29/2023 0014       Studies/Results: ECHOCARDIOGRAM COMPLETE Result Date: 07/31/2023     ECHOCARDIOGRAM REPORT   Patient Name:   PRATHAM CASSATT Date of Exam: 07/31/2023 Medical Rec #:  [id number]     Height:       76.0 in Accession #:    [id number]    Weight:       205.0 lb Date of Birth:  [birth year]     BSA:          2.239 m Patient Age:    73 years      BP:           96/74 mmHg Patient Gender: M             HR:           100 bpm. Exam Location:  Inpatient Procedure: 2D Echo, Cardiac Doppler, Color Doppler and Intracardiac            Opacification Agent Indications:    Preoperative Evaluation  History:        Patient has prior history of Echocardiogram examinations, most                 recent 09/11/2014. CAD, Aortic Valve Disease, Arrythmias:Atrial                 Fibrillation;  Signs/Symptoms:Hypotension.  Sonographer:    Lanell Maduro Referring Phys: [id number] SHENG L HALEY  Sonographer Comments: Suboptimal parasternal window and suboptimal apical window. IMPRESSIONS  1. The aortic valve is severely calcified in views obtained. Vmax 2.9 m/s, MG 20 mmHG, AVA 0.96 cm2, DI 0.21. LV SVI low 22 cc/m2. 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 If there are clinical concerns for severe aortic stenosis, would recommend an aortic valve calcium  score for clarification. The aortic valve is calcified. Aortic valve regurgitation is not visualized. Moderate aortic valve stenosis. Aortic valve area, by VTI measures 0.96 cm. Aortic valve mean gradient measures 20.3 mmHg. Aortic valve Vmax measures 2.93 m/s.  2. Left ventricular ejection fraction, by estimation, is 60 to 65%. The left ventricle has normal function. The left ventricle has no regional wall motion abnormalities. Indeterminate diastolic filling due to E-A fusion.  3. Right ventricular systolic function is mildly reduced. The right ventricular size is mildly enlarged. There is normal pulmonary artery systolic pressure. The estimated right ventricular systolic pressure is 30.2 mmHg.  4. Left atrial size was  moderately dilated.  5. Right atrial size was mildly dilated.  6. The mitral valve is grossly normal. Trivial mitral valve regurgitation. No evidence of mitral stenosis.  7. The inferior vena cava is normal in size with greater than 50% respiratory variability, suggesting right atrial pressure of 3 mmHg. FINDINGS  Left Ventricle: Left ventricular ejection fraction, by estimation, is 60 to 65%. The left ventricle has normal function. The left ventricle has no regional wall motion abnormalities. Definity  contrast agent was given IV to delineate the left ventricular  endocardial borders. The left ventricular internal cavity size was normal in size. There is no left ventricular hypertrophy. Indeterminate diastolic filling due to E-A fusion. Right Ventricle: The right ventricular size is mildly enlarged. No increase in right ventricular wall thickness. Right ventricular systolic function is mildly reduced. There is normal pulmonary artery systolic pressure. The tricuspid regurgitant velocity  is 1.95 m/s, and with an assumed right atrial pressure of 15 mmHg, the estimated right ventricular systolic pressure is 30.2 mmHg. Left Atrium: Left atrial size was moderately dilated. Right Atrium: Right atrial size was mildly dilated. Pericardium: There is no evidence of pericardial effusion. Mitral Valve: The mitral valve is grossly normal. Trivial mitral valve regurgitation. No evidence of mitral valve stenosis. Tricuspid Valve: The tricuspid valve is grossly normal. Tricuspid valve regurgitation is trivial. No evidence of tricuspid stenosis. Aortic Valve: The aortic valve is severely calcified in views obtained. Vmax 2.9 m/s, MG 20 mmHG, AVA 0.96 cm2, DI 0.21. LV SVI low 22 cc/m2. 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 If there are clinical concerns for severe aortic stenosis, would recommend an aortic valve calcium  score for clarification. The aortic valve is calcified. Aortic  valve regurgitation is not visualized. Moderate aortic stenosis is present. Aortic valve mean gradient measures 20.3 mmHg. Aortic valve peak gradient measures 34.3 mmHg. Aortic valve area, by VTI measures 0.96 cm. Pulmonic Valve: The pulmonic valve was grossly normal. Pulmonic valve regurgitation is not visualized. No evidence of pulmonic stenosis. Aorta: The aortic root and ascending aorta are structurally normal, with no evidence of dilitation. Venous: The inferior vena cava is normal in size with greater than 50% respiratory variability, suggesting right atrial pressure of 3 mmHg. IAS/Shunts: The atrial septum is grossly normal.  LEFT VENTRICLE PLAX 2D LVIDd:         4.70 cm  Diastology LVIDs:         3.30 cm      LV e' medial:    10.40 cm/s LV PW:         1.00 cm      LV E/e' medial:  5.8 LV IVS:        0.90 cm      LV e' lateral:   14.60 cm/s LVOT diam:     2.40 cm      LV E/e' lateral: 4.1 LV SV:         50 LV SV Index:   22 LVOT Area:     4.52 cm  LV Volumes (MOD) LV vol d, MOD A2C: 61.7 ml LV vol d, MOD A4C: 113.0 ml LV vol s, MOD A2C: 29.6 ml LV vol s, MOD A4C: 45.4 ml LV SV MOD A2C:     32.1 ml LV SV MOD A4C:     113.0 ml LV SV MOD BP:      51.5 ml RIGHT VENTRICLE            IVC RV Basal diam:  4.20 cm    IVC diam: 3.20 cm RV Mid diam:    3.40 cm RV S prime:     9.48 cm/s TAPSE (M-mode): 1.7 cm LEFT ATRIUM            Index        RIGHT ATRIUM           Index LA diam:      5.60 cm  2.50 cm/m   RA Area:     17.50 cm LA Vol (A4C): 115.0 ml 51.37 ml/m  RA Volume:   41.50 ml  18.54 ml/m  AORTIC VALVE AV Area (Vmax):    0.99 cm AV Area (Vmean):   0.98 cm AV Area (VTI):     0.96 cm AV Vmax:           292.98 cm/s AV Vmean:          200.557 cm/s AV VTI:            0.526 m AV Peak Grad:      34.3 mmHg AV Mean Grad:      20.3 mmHg LVOT Vmax:         64.30 cm/s LVOT Vmean:        43.400 cm/s LVOT VTI:          0.111 m LVOT/AV VTI ratio: 0.21  AORTA Ao Asc diam: 3.50 cm MITRAL VALVE               TRICUSPID  VALVE MV Area (PHT): 6.32 cm    TR Peak grad:   15.2 mmHg MV Decel Time: 120 msec    TR Vmax:        195.00 cm/s MV E velocity: 60.20 cm/s MV A velocity: 40.30 cm/s  SHUNTS MV E/A ratio:  1.49        Systemic VTI:  0.11 m                            Systemic Diam: 2.40 cm Darryle Decent MD Electronically signed by Darryle Decent MD Signature Date/Time: 07/31/2023/10:21:24 AM    Final     Anti-infectives: Anti-infectives (From admission, onward)    Start     Dose/Rate Route Frequency Ordered Stop   07/29/23 0800  piperacillin -tazobactam (ZOSYN ) IVPB 3.375 g  3.375 g 12.5 mL/hr over 240 Minutes Intravenous Every 8 hours 07/29/23 0207     07/29/23 0030  piperacillin -tazobactam (ZOSYN ) IVPB 3.375 g        3.375 g 100 mL/hr over 30 Minutes Intravenous  Once 07/29/23 0020 07/29/23 0147        Assessment/Plan Acute cholecystitis -will plan for OR today for lap chole w/ possible IOC.  We discussed the alternatives and potential risks of surgery, including but not limited to: bleeding, infection, damage to bowel or surrounding structures, need for subtotal cholecystectomy, bile leak, damage to the biliary system, pancreatitis, retained stone, and need for additional procedures. All questions were addressed and consent was obtained.  - NPO - Continue zosyn     LOS: 3 days    Cordella DELENA Idler , MD [facility] Surgery 08/01/2023, 7:58 AM Please see Amion for pager number during day hours 7:00am-4:30pm or 7:00am -11:30am on weekends

## 2023-08-01 NOTE — Op Note (Addendum)
 Patient: Francisco Padilla MRN: [id number] DOB: [birth year] Sex: male Operation/Procedure Date: 08/01/2023 Surgeons and Role:    * Polly, Cordella LABOR, MD - Primary  Pre-operative Diagnoses: CHOLECYSTITIS Postoperative Diagnoses: Gangrenous cholecystitis  Procedure performed: Subtotal cholecystectomy  Indications: [name] is a 73 year old male who presented to the ED with abdominal pain for several days.  Imaging showed gallstones, and based on history and physical exam she was diagnosed with acute cholecystitis. He was on Eliquis  and required a washout period before going to the OR.Preoperatively, I discussed in detail the risks, benefits, alternatives, and potential complications. The patient understands and requests to proceed.  Operative Findings: Gangrenous cholecystitis with perforation.  Critical structures could not be identified. Subtotal cholecystectomy performed and 19Fr drain left in the gallbladder fossa.  Operative Narrative: The patient was positively identified and was taken to the operating room and placed supine on the operating table. A time-out was performed confirming correct patient and procedure. We also confirmed initiation of deep venous thrombosis prophylaxis and wound prophylaxis. After successful induction of general endotracheal anesthesia, the arms were carefully padded. An orogastric tube and footboard were placed. The abdomen was prepped and draped in the usual sterile surgical fashion.  We began our peritoneal access with a veress needle inserted at Palmer's point.  After aspiration showed return of air bubbles and there was a positive saline drop test, the insufflation was connected and the abdomen brought to a pressure of .  We then used an opti-view technique to place a 5mm port just to the right of midline, superior to the umbilicus.  A laparoscope was introduced into the abdomen, and there were no signs of injury from entry.  A 12mm port was placed in the  subxiphoid position.  Two additional 5mm ports were placed in the RUQ.  A 360-degree visualization with a 30-degree 5-mm laparoscope revealed grossly normal intra-abdominal contents.   The patient was placed in the head up position and tilted slightly to the left. There was significant inflammation, making it difficult to grasp the gallbladder.  A needle was used to aspirate the contents of the gallbladder, which revealed frank purulence.  There were multiple adhesions between the omentum and gallbladder laterally that I was able to free bluntly. The duodenum was adherent to the liver and gallbladder along the medial edge.  I carefully freed this off and retracted the infundibulum laterally.  I inspected the area where the critical structures would be expected and found that the gallbladder in this area was necrotic and there was gross spillage of purulence that had created an inflammatory mass.  I decided at this time that performing a total cholecystectomy would not be safe and instead decided on a subtotal.  Using electrocautery, I separated the gallbladder from the fossa starting at the dome and working toward the infundibulum.  After separating approximately two thirds of the gallbladder I began to approach an area where anatomy was not readily identifiable.  I transected the gallbladder at this point.  I then inspected the inside of the gallbladder and found more purulence and a few stones. The stones were retrieved and the area irrigated with sterile saline.  I could not clearly identify the cystic duct or artery.  I started to remove a few more centimeters of the anterior wall when I came across an artery at the medial interface between the gallbladder and the liver. The patient lost approximately of blood while I worked to control the vessel. I was  ultimately able to clip the vessel and control the bleeding.  I decided to stop my dissection of the gallbladder at this point, leaving a small cuff of  infundibulum behind.  I fulgurated the remaining posterior wall.  I irrigated the subhepatic space with sterile saline and the effluent ran clear. I brought a 19Fr drain out of the left lateral port and placed it in the subhepatic space.  The specimen was placed in a retrieval bag and brought out through the subxiphoid port. The fascia at the subxiphoid port was closed using a running 0 vicryl in two layers. We placed 0.25% Marcaine  with epinephrine  at each incision site for local anesthesia. The skin was closed using 4-0 Monocryl subcuticular suture. Dermabond was applied. The patient tolerated the procedure well, was extubated, and taken to the recovery room.  CASE DATA: Type of patient?: DOW CASE (Surgical Hospitalist [facility]) Status of Case? URGENT Add On Infection Present At Time Of Surgery (PATOS)?  PURULENCE   Estimated Blood Loss: 500 mL Specimens: Gallbladder Implants: None Drains: None Complications: None Condition of the patient: Good, extubated Disposition: PACU  Cordella DELENA Idler Date: 08/01/2023 Time: 5:19 PM

## 2023-08-01 NOTE — Anesthesia Procedure Notes (Signed)
 Procedure Name: Intubation Date/Time: 08/01/2023 2:26 PM  Performed by: Salina Leandrew RAMAN, CRNAPre-anesthesia Checklist: Patient identified, Emergency Drugs available, Suction available and Patient being monitored Patient Re-evaluated:Patient Re-evaluated prior to induction Oxygen Delivery Method: Circle System Utilized Preoxygenation: Pre-oxygenation with 100% oxygen Induction Type: IV induction Ventilation: Mask ventilation without difficulty Laryngoscope Size: Miller and 2 Grade View: Grade I Tube type: Oral Tube size: 7.5 mm Number of attempts: 1 Airway Equipment and Method: Stylet and Oral airway Placement Confirmation: ETT inserted through vocal cords under direct vision, positive ETCO2 and breath sounds checked- equal and bilateral Secured at: 22 cm Tube secured with: Tape Dental Injury: Teeth and Oropharynx as per pre-operative assessment

## 2023-08-01 NOTE — Anesthesia Preprocedure Evaluation (Addendum)
 Anesthesia Evaluation  Patient identified by MRN, date of birth, ID band Patient awake    Reviewed: Allergy & Precautions, H&P , NPO status , Patient's Chart, lab work & pertinent test results  Airway Mallampati: II  TM Distance: >3 FB Neck ROM: Full    Dental no notable dental hx. (+) Teeth Intact, Dental Advisory Given   Pulmonary asthma , sleep apnea    Pulmonary exam normal breath sounds clear to auscultation       Cardiovascular + CAD  negative cardio ROS + dysrhythmias Atrial Fibrillation + Valvular Problems/Murmurs AS  Rhythm:Irregular Rate:Normal     Neuro/Psych   Anxiety Depression    negative neurological ROS  negative psych ROS   GI/Hepatic Neg liver ROS, PUD,GERD  Medicated,,  Endo/Other  negative endocrine ROS    Renal/GU negative Renal ROS  negative genitourinary   Musculoskeletal  (+) Arthritis , Osteoarthritis,    Abdominal   Peds  Hematology negative hematology ROS (+)   Anesthesia Other Findings   Reproductive/Obstetrics negative OB ROS                             Anesthesia Physical Anesthesia Plan  ASA: 3  Anesthesia Plan: General   Post-op Pain Management:    Induction: Intravenous  PONV Risk Score and Plan: 3 and Ondansetron , Dexamethasone  and Treatment may vary due to age or medical condition  Airway Management Planned: Oral ETT  Additional Equipment: ClearSight  Intra-op Plan:   Post-operative Plan: Extubation in OR  Informed Consent: I have reviewed the patients History and Physical, chart, labs and discussed the procedure including the risks, benefits and alternatives for the proposed anesthesia with the patient or authorized representative who has indicated his/her understanding and acceptance.     Dental advisory given  Plan Discussed with: CRNA  Anesthesia Plan Comments:        Anesthesia Quick Evaluation

## 2023-08-01 NOTE — Progress Notes (Signed)
 [facility] PROGRESS NOTE  Francisco Padilla (DOB: 1950/09/07) FMW:[id number] PCP: Sherlynn Madden, MD  Brief Narrative: Francisco Padilla is a 73 y.o. 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 Cardiology is consulted for chronic AFib and anticoagulation recommendations. General surgery is currently recommending laparoscopic cholecystectomy.   Subjective: No complaints, says he's rested well and would like to go back to sleep. No chest pain, dyspnea or palpitations noted, denies bleeding.   Objective: BP 96/68 (BP Location: Left Arm)   Pulse (!) 102   Temp 99.4 F (37.4 C) (Oral)   Resp 16   Ht 6' 4 (1.93 m)   Wt 93 kg   SpO2 97%   BMI 24.96 kg/m   Gen: No distress wearing nasal CPAP Pulm: Clear, nonlabored  CV: Irreg irreg, stable murmur, no edema GI: Soft, modestly tender to light palpation in RUQ, no distention or rebound, +BS Neuro: Alert and oriented. No new focal deficits. Ext: Warm, no deformities Skin: No new rashes, lesions or ulcers on visualized skin   Assessment & Plan: Sepsis due to acute cholecystitis: Hepatitis panel negative.  - Continue zosyn , WBC coming down, though still quite tender, and LFTs up from admission.   - Follow up blood cultures - Continue pain control as ordered and postop per surgery.  - Diet NPO preop, planning la chole 1/8.   Chronic AFib: Afib on admission, remains rate controlled.   - Monitor telemetry.  - Holding eliquis , CHA2DS2-VASc score is 2. We've bridged with heparin , to stop this morning preop.  - Not on rate control agent at baseline.  - Echocardiogram showed preserved LVEF, severe AS for which cardiology recommends no further work up currently and suggests avoiding perioperative  hypotension.   AKI: Improving Cr 1.51 > 0.67 which is baseline and CrCl >64ml/min, remains stable. FENa was low. - Avoid hypotension.   Insomnia:  - No changes to home regimen  Peripheral neuropathy:  - Continue gabapentin   Abnormal hepatic appearance by CT: Hepatitis panel negative - Holding statin  Hypokalemia:  - Supplement again today with Mg, monitor.   Chronic hypotension:  - Continue florinef    [name], MD [facility] [email or web address] 08/01/2023, 7:41 AM

## 2023-08-01 NOTE — Transfer of Care (Addendum)
 Immediate Anesthesia Transfer of Care Note  Patient: Francisco Padilla  Procedure(s) Performed: LAPAROSCOPIC CHOLECYSTECTOMY WITH ICG DYE (Abdomen)  Patient Location: PACU  Anesthesia Type:General  Level of Consciousness: awake, alert , and oriented  Airway & Oxygen Therapy: Patient Spontanous Breathing and Patient connected to face mask oxygen  Post-op Assessment: Report given to RN and Post -op Vital signs reviewed and stable  Post vital signs: Reviewed and stable  Last Vitals:  Vitals Value Taken Time  BP 90/67 08/01/23 1715  Temp    Pulse 112 08/01/23 1724  Resp 19 08/01/23 1724  SpO2 93 % 08/01/23 1724  Vitals shown include unfiled device data.  Last Pain:  Vitals:   08/01/23 1222  TempSrc:   PainSc: 3       Patients Stated Pain Goal: 0 (07/29/23 2235)  Complications: No notable events documented.

## 2023-08-01 NOTE — Consult Note (Signed)
 NAME:  Francisco Padilla, MRN:  [id number], DOB:  1951-03-23, LOS: 3 ADMISSION DATE:  07/28/2023, CONSULTATION DATE:  1/8 REFERRING MD:  Dr. Bryn, CHIEF COMPLAINT:  Cholecystitis s/p chole; hypotension   History of Present Illness:  Patient is a 73 year old male with pertinent PMH OSA on CPAP, HLD, chronic hypotension, PAF on Eliquis , PVD presents to [facility] ED on 1/5 with cholecystitis.  Patient complaining of weakness/fatigue going on for about a month.  Patient also having nausea, abdominal pain, diarrhea.  Abdominal pain worse in right upper quadrant.  Denies fever/chills.  Came to [facility] ED on 1/5 for further workup.  On arrival patient noted to be slightly tachycardic 100s and mildly hypotensive 93/73.  CT ABD/pelvis showing acute cholecystitis.  Surgery consulted.  Given IV fluids and Zosyn .  Awaiting surgery 48 hours for Eliquis  washout. Cards consulted for preoperative evaluation of cholecystectomy. Echo on 1/7 showing lvef 60-65%; severe aortic stenosis.  On 1/8 patient taken to the OR for cholecystectomy.  While in the OR patient had tachycardia 150s and started on esmolol .  Patient later became hypotensive and started on neo.  Postop patient taken to ICU while on pressors.  PCCM consulted.  Pertinent  Medical History   Past Medical History:  Diagnosis Date   ADD (attention deficit disorder)    Anxiety 2007   Asthma as child   Asymptomatic gallstones    Atrial fibrillation (HCC)    Atrial flutter (HCC)    Depression    DJD (degenerative joint disease) of knee    Dysrhythmia    Chronic Atrial Fib- seeing Dr. DOROTHA [facility]   H/O peptic ulcer    past history with GI bleed- cauterization done throught scope   History of kidney stones    pt claims urology discovered a kidney stone last week 05/25/21   Hypercholesterolemia    Orthostatic hypotension    Pneumonia as child   Pneumothorax on left 05/20/2009   Prediabetes    none now   Pulmonary nodules    Sleep apnea    cpap set  on 13   Thrombocytopenia (HCC)    pt denies   Thyromegaly    Varicose veins      Significant Hospital Events: Including procedures, antibiotic start and stop dates in addition to other pertinent events   1/5 admitted with cholecystitis 1/8 s/p cholecystectomy; given esmolol  for HR 150s then became hypotensive on neo-; postop taken to ICU and PCCM consulted  Interim History / Subjective:  See above  Objective   Blood pressure 100/77, pulse 93, temperature 98.2 F (36.8 C), temperature source Oral, resp. rate 20, height 6' 4 (1.93 m), weight 93 kg, SpO2 97%.        Intake/Output Summary (Last 24 hours) at 08/01/2023 1637 Last data filed at 08/01/2023 1633 Gross per 24 hour  Intake 1990 ml  Output 1000 ml  Net 990 ml   Filed Weights   07/29/23 1408 07/30/23 0421 07/31/23 0635  Weight: 90.4 kg 92.8 kg 93 kg    Examination: General:  NAD HEENT: MM pink/dry; [place] in place Neuro: Aox3; MAE CV: s1s2, irregularly irregular rate 110s PULM:  dim clear BS bilaterally; [place] GI: soft, JP drain in place Extremities: warm/dry, no edema  Skin: no rashes or lesions    Resolved Hospital Problem list     Assessment & Plan:  Acute cholecystitis s/p cholecystectomy Sepsis Plan: -surgery following; appreciate recs -check post op labs: cbc, cmp, ptt, pt, inr -cont zosyn ; follow cultures  Acute on chronic hypotension -On Florinef  at home; given esmolol  for tachycardia and OR then became hypotensive requiring neo- Plan: -currently off pressors; if map goal <65 consider starting levo; may need more fluids -cont florinef  -consider adding midodrine   Chronic afib Severe aortic stenosis HLD -HR 150s in OR given esmolol  then hypotensive Plan: -cards following; appreciate recs -tele monitoring -when to resume AC post op per surgery -if having continual afib RVR consider amio for rate control w/ hypotension -statin  AKI Hypokalemia Plan: -Trend BMP / urinary output -Replace  electrolytes as indicated -Avoid nephrotoxic agents, ensure adequate renal perfusion  Peripheral neuropathy Plan: -gabapentin   OSA on CPAP Plan: -cpap qhs   Best Practice (right click and Reselect all SmartList Selections daily)   Diet/type: NPO w/ oral meds DVT prophylaxis SCD; when to resume AC post op per surgery Pressure ulcer(s): N/A GI prophylaxis: PPI Lines: N/A Foley:  N/A Code Status:  full code Last date of multidisciplinary goals of care discussion [pending]  Labs   CBC: Recent Labs  Lab 07/28/23 2135 07/29/23 0459 07/30/23 0225 07/31/23 0255 08/01/23 0303  WBC 23.8* 20.6* 19.1* 15.5* 13.3*  HGB 13.8 12.4* 12.0* 11.5* 11.4*  HCT 41.0 36.4* 36.1* 34.0* 33.9*  MCV 92.1 91.0 92.3 91.4 91.9  PLT 194 187 180 175 167    Basic Metabolic Panel: Recent Labs  Lab 07/28/23 2135 07/29/23 0459 07/30/23 0225 07/31/23 0255 08/01/23 0303  NA 137 136 135 135 132*  K 4.1 3.8 3.4* 3.4* 3.4*  CL 100 102 104 104 99  CO2 27 23 21* 22 23  GLUCOSE 151* 116* 109* 105* 107*  BUN 68* 56* 30* 20 18  CREATININE 1.51* 1.10 0.74 0.73 0.67  CALCIUM  10.0 9.3 9.0 8.9 8.8*  MG  --   --   --  1.7 1.8   GFR: Estimated Creatinine Clearance: 104 mL/min (by C-G formula based on SCr of 0.67 mg/dL). Recent Labs  Lab 07/29/23 0022 07/29/23 0459 07/30/23 0225 07/31/23 0255 08/01/23 0303  WBC  --  20.6* 19.1* 15.5* 13.3*  LATICACIDVEN 1.4  --   --   --   --     Liver Function Tests: Recent Labs  Lab 07/29/23 0014 07/29/23 0459 07/30/23 0225 07/31/23 0255 08/01/23 0303  AST 22 19 27  48* 43*  ALT 17 15 23  49* 53*  ALKPHOS 63 58 70 78 78  BILITOT 0.9 0.8 0.9 1.4* 1.4*  PROT 6.7 5.9* 5.6* 5.5* 5.3*  ALBUMIN  3.2* 2.9* 2.6* 2.4* 2.2*   Recent Labs  Lab 07/29/23 0014  LIPASE 29   No results for input(s): AMMONIA in the last 168 hours.  ABG    Component Value Date/Time   TCO2 30 10/21/2009 1950     Coagulation Profile: No results for input(s): INR,  PROTIME in the last 168 hours.  Cardiac Enzymes: No results for input(s): CKTOTAL, CKMB, CKMBINDEX, TROPONINI in the last 168 hours.  HbA1C: No results found for: HGBA1C  CBG: Recent Labs  Lab 07/28/23 2205  GLUCAP 138*    Review of Systems:   Review of Systems  Constitutional:  Negative for fever.  Respiratory:  Negative for shortness of breath.   Cardiovascular:  Negative for chest pain.  Gastrointestinal:  Positive for nausea. Negative for vomiting.     Past Medical History:  He,  has a past medical history of ADD (attention deficit disorder), Anxiety (2007), Asthma (as child), Asymptomatic gallstones, Atrial fibrillation (HCC), Atrial flutter (HCC), Depression, DJD (degenerative joint disease) of  knee, Dysrhythmia, H/O peptic ulcer, History of kidney stones, Hypercholesterolemia, Orthostatic hypotension, Pneumonia (as child), Pneumothorax on left (05/20/2009), Prediabetes, Pulmonary nodules, Sleep apnea, Thrombocytopenia (HCC), Thyromegaly, and Varicose veins.   Surgical History:   Past Surgical History:  Procedure Laterality Date   BIOPSY  04/18/2018   Procedure: BIOPSY;  Surgeon: Saintclair Jasper, MD;  Location: [facility];  Service: Gastroenterology;;   ESOPHAGOGASTRODUODENOSCOPY N/A 04/18/2018   Procedure: ESOPHAGOGASTRODUODENOSCOPY (EGD);  Surgeon: Saintclair Jasper, MD;  Location: [facility];  Service: Gastroenterology;  Laterality: N/A;   ESOPHAGOGASTRODUODENOSCOPY (EGD) WITH PROPOFOL  N/A 01/05/2015   Procedure: ESOPHAGOGASTRODUODENOSCOPY (EGD) WITH PROPOFOL ;  Surgeon: Gladis MARLA Louder, MD;  Location: [facility];  Service: Endoscopy;  Laterality: N/A;   EXTRACORPOREAL SHOCK WAVE LITHOTRIPSY Left 08/11/2021   Procedure: LEFT EXTRACORPOREAL SHOCK WAVE LITHOTRIPSY (ESWL);  Surgeon: Selma Donnice SAUNDERS, MD;  Location[facility];  Service: Urology;  Laterality: Left;   GASTRIC ROUX-EN-Y     '08-[name] (weight stable around 302 #   KNEE ARTHROSCOPY      NASAL SEPTOPLASTY W/ TURBINOPLASTY Bilateral 11/13/2022   Procedure: NASAL SEPTOPLASTY WITH BILATERAL TURBINATE REDUCTION;  Surgeon: Karis Clunes, MD;  Location: [facility];  Service: ENT;  Laterality: Bilateral;   TONSILLECTOMY     TOTAL KNEE ARTHROPLASTY Left 06/03/2021   Procedure: LEFT TOTAL KNEE ARTHROPLASTY;  Surgeon: Harden Jerona GAILS, MD;  Location: [facility] OR;  Service: Orthopedics;  Laterality: Left;   ULNAR NERVE TRANSPOSITION Right 15 yrs ago     Social History:   reports that he has never smoked. He has never used smokeless tobacco. He reports that he does not drink alcohol and does not use drugs.   Family History:  His family history includes Heart attack in his father; Heart disease in his father and mother; Heart failure in his mother. There is no history of Stroke.   Allergies Allergies  Allergen Reactions   Aspirin     Avoid due to bariatric surgery   Bupropion Other (See Comments)    Passed out   Nsaids     Avoid due to bariatric surgery   Oxycodone  Hcl     Hallucinations, agitation      Home Medications  Prior to Admission medications   Medication Sig Start Date End Date Taking? Authorizing Provider  acetaminophen  (TYLENOL ) 650 MG CR tablet Take 1,300 mg by mouth every 8 (eight) hours.   Yes [provider]  alfuzosin  (UROXATRAL ) 10 MG 24 hr tablet Take 10 mg by mouth daily with breakfast.   Yes [provider]  amphetamine -dextroamphetamine  (ADDERALL) 5 MG tablet Take 5 mg by mouth daily as needed (for ADD).   Yes [provider]  apixaban  (ELIQUIS ) 5 MG TABS tablet Take 5 mg by mouth 2 (two) times daily.   Yes [provider]  atorvastatin  (LIPITOR) 20 MG tablet Take 20 mg by mouth at bedtime. 04/03/21  Yes [provider]  cetirizine  (ZYRTEC ) 10 MG tablet Take 1 tablet (10 mg total) by mouth daily. 06/14/23  Yes Soldatova, Liuba, MD  Coenzyme Q10 (CO Q 10 PO) Take 1 capsule by mouth daily.   Yes [provider]  desvenlafaxine (PRISTIQ) 50 MG 24 hr tablet Take 50 mg by mouth daily. 02/05/23  Yes [provider]  fludrocortisone  (FLORINEF ) 0.1 MG tablet Take 0.1 mg by mouth daily. 03/07/19  Yes [provider]  fluticasone  (FLONASE ) 50 MCG/ACT nasal spray Place 2 sprays into both nostrils daily. 06/14/23  Yes Okey Burns, MD  gabapentin  (NEURONTIN ) 300 MG capsule Take 2 capsules (600 mg total) by mouth at bedtime. 07/10/23 10/08/23 Yes Gershon Donnice SAUNDERS, DPM  Glucosamine-Chondroitin (COSAMIN DS PO) Take 1,500 mg by mouth daily. Rotate with other Joint Health   Yes [provider]  latanoprost  (XALATAN ) 0.005 % ophthalmic solution Place 1 drop into both eyes at bedtime. 04/23/21  Yes [provider]  LORazepam  (ATIVAN ) 1 MG tablet Take 1 mg by mouth every 4 (four) hours as needed.   Yes [provider]  Melatonin 10 MG TABS Take 1 tablet by mouth at bedtime.   Yes [provider]  Misc Natural Products (OSTEO BI-FLEX ADV TRIPLE ST PO) Take 1 tablet by mouth daily.   Yes [provider]  Multiple Vitamins-Minerals (ONE DAILY MULTIVITAMIN MEN) TABS Take 1 tablet by mouth 2 (two) times daily. CENTRUM SILVER 04/13/06  Yes [provider]  pantoprazole  (PROTONIX ) 40 MG tablet Take 40 mg by mouth in the morning and at bedtime. 10/24/09  Yes [provider]  Probiotic Product (PROBIOTIC PO) Take 1-2 capsules by mouth daily.   Yes [provider]  sildenafil (REVATIO) 20 MG tablet Take 20 mg by mouth daily as needed.   Yes [provider]  sodium chloride  (OCEAN) 0.65 % SOLN nasal spray Place 1 spray into both nostrils as needed. 06/14/23  Yes Soldatova, Liuba, MD  SUPER B COMPLEX/C PO Take 1 tablet by mouth daily.   Yes [provider]  zolpidem  (AMBIEN ) 10 MG tablet Take 5-10 mg by mouth at bedtime. 08/12/14  Yes [provider]     Critical care time: 45 minutes     JD Emilio DEVONNA [facility] 08/01/2023, 4:37 PM  Please see [email or web address] for pager details.  From 7A-7P if no response, please call [phone]. After hours, please call ELink [phone].

## 2023-08-02 ENCOUNTER — Encounter (HOSPITAL_COMMUNITY): Payer: Self-pay | Admitting: General Surgery

## 2023-08-02 DIAGNOSIS — K819 Cholecystitis, unspecified: Secondary | ICD-10-CM

## 2023-08-02 DIAGNOSIS — Z7901 Long term (current) use of anticoagulants: Secondary | ICD-10-CM | POA: Diagnosis not present

## 2023-08-02 DIAGNOSIS — I9589 Other hypotension: Secondary | ICD-10-CM | POA: Diagnosis not present

## 2023-08-02 DIAGNOSIS — I350 Nonrheumatic aortic (valve) stenosis: Secondary | ICD-10-CM | POA: Diagnosis not present

## 2023-08-02 DIAGNOSIS — K810 Acute cholecystitis: Secondary | ICD-10-CM | POA: Diagnosis not present

## 2023-08-02 DIAGNOSIS — I4821 Permanent atrial fibrillation: Secondary | ICD-10-CM | POA: Diagnosis not present

## 2023-08-02 LAB — GLUCOSE, CAPILLARY
Glucose-Capillary: 138 mg/dL — ABNORMAL HIGH (ref 70–99)
Glucose-Capillary: 144 mg/dL — ABNORMAL HIGH (ref 70–99)
Glucose-Capillary: 200 mg/dL — ABNORMAL HIGH (ref 70–99)
Glucose-Capillary: 196 mg/dL — ABNORMAL HIGH (ref 70–99)
Glucose-Capillary: 193 mg/dL — ABNORMAL HIGH (ref 70–99)
Glucose-Capillary: 191 mg/dL — ABNORMAL HIGH (ref 70–99)

## 2023-08-02 LAB — COMPREHENSIVE METABOLIC PANEL
ALT: 43 U/L (ref 0–44)
AST: 32 U/L (ref 15–41)
Albumin: 2.5 g/dL — ABNORMAL LOW (ref 3.5–5.0)
Alkaline Phosphatase: 69 U/L (ref 38–126)
Anion gap: 13 (ref 5–15)
BUN: 20 mg/dL (ref 8–23)
CO2: 19 mmol/L — ABNORMAL LOW (ref 22–32)
Calcium: 8.8 mg/dL — ABNORMAL LOW (ref 8.9–10.3)
Chloride: 101 mmol/L (ref 98–111)
Creatinine, Ser: 0.81 mg/dL (ref 0.61–1.24)
GFR, Estimated: >60 mL/min (ref >60)
Glucose, Bld: 154 mg/dL — ABNORMAL HIGH (ref 70–99)
Potassium: 4.8 mmol/L (ref 3.5–5.1)
Sodium: 133 mmol/L — ABNORMAL LOW (ref 135–145)
Total Bilirubin: 1.5 mg/dL — ABNORMAL HIGH (ref 0.0–1.2)
Total Protein: 5.8 g/dL — ABNORMAL LOW (ref 6.5–8.1)

## 2023-08-02 LAB — CBC
HCT: 36.3 % — ABNORMAL LOW (ref 39.0–52.0)
HCT: 37.3 % — ABNORMAL LOW (ref 39.0–52.0)
Hemoglobin: 12 g/dL — ABNORMAL LOW (ref 13.0–17.0)
Hemoglobin: 12.5 g/dL — ABNORMAL LOW (ref 13.0–17.0)
MCH: 30.9 pg (ref 26.0–34.0)
MCH: 30.7 pg (ref 26.0–34.0)
MCHC: 33.1 g/dL (ref 30.0–36.0)
MCHC: 33.5 g/dL (ref 30.0–36.0)
MCV: 93.6 fL (ref 80.0–100.0)
MCV: 91.6 fL (ref 80.0–100.0)
Platelets: 185 10*3/uL (ref 150–400)
Platelets: 216 10*3/uL (ref 150–400)
RBC: 3.88 MIL/uL — ABNORMAL LOW (ref 4.22–5.81)
RBC: 4.07 MIL/uL — ABNORMAL LOW (ref 4.22–5.81)
RDW: 13.1 % (ref 11.5–15.5)
RDW: 12.9 % (ref 11.5–15.5)
WBC: 15.1 10*3/uL — ABNORMAL HIGH (ref 4.0–10.5)
WBC: 18.3 10*3/uL — ABNORMAL HIGH (ref 4.0–10.5)
nRBC: 0 % (ref 0.0–0.2)
nRBC: 0 % (ref 0.0–0.2)

## 2023-08-02 LAB — HEPARIN LEVEL (UNFRACTIONATED): Heparin Unfractionated: 0.12 [IU]/mL — ABNORMAL LOW (ref 0.30–0.70)

## 2023-08-02 LAB — APTT: aPTT: 38 s — ABNORMAL HIGH (ref 24–36)

## 2023-08-02 LAB — MAGNESIUM: Magnesium: 1.9 mg/dL (ref 1.7–2.4)

## 2023-08-02 MED ORDER — SIMETHICONE 80 MG PO CHEW
80.0000 mg | CHEWABLE_TABLET | Freq: Four times a day (QID) | ORAL | Status: DC | PRN
  Administered 2023-08-02 – 2023-08-03 (×3): 80 mg via ORAL
  Filled 2023-08-02 (×4): qty 1

## 2023-08-02 MED ORDER — MAGNESIUM SULFATE 2 GM/50ML IV SOLN
2.0000 g | Freq: Once | INTRAVENOUS | Status: AC
  Administered 2023-08-02: 2 g via INTRAVENOUS
  Filled 2023-08-02: qty 50

## 2023-08-02 MED ORDER — ACETAMINOPHEN 650 MG RE SUPP
650.0000 mg | RECTAL | Status: DC
  Filled 2023-08-02: qty 1

## 2023-08-02 MED ORDER — HEPARIN (PORCINE) 25000 UT/250ML-% IV SOLN
2700.0000 [IU]/h | INTRAVENOUS | Status: DC
  Administered 2023-08-02: 1300 [IU]/h via INTRAVENOUS
  Administered 2023-08-03: 1500 [IU]/h via INTRAVENOUS
  Administered 2023-08-03: 1700 [IU]/h via INTRAVENOUS
  Administered 2023-08-04: 2100 [IU]/h via INTRAVENOUS
  Administered 2023-08-05: 2200 [IU]/h via INTRAVENOUS
  Administered 2023-08-05: 2400 [IU]/h via INTRAVENOUS
  Administered 2023-08-06 (×2): 2700 [IU]/h via INTRAVENOUS
  Filled 2023-08-02 (×8): qty 250

## 2023-08-02 MED ORDER — HYDROMORPHONE HCL 2 MG PO TABS
2.0000 mg | ORAL_TABLET | Freq: Once | ORAL | Status: AC
  Administered 2023-08-02: 2 mg via ORAL
  Filled 2023-08-02: qty 1

## 2023-08-02 MED ORDER — ACETAMINOPHEN 650 MG RE SUPP: 650.0000 mg | Freq: Four times a day (QID) | RECTAL | Status: DC | PRN

## 2023-08-02 MED ORDER — LACTATED RINGERS IV BOLUS
500.0000 mL | Freq: Once | INTRAVENOUS | Status: AC
  Administered 2023-08-02: 500 mL via INTRAVENOUS

## 2023-08-02 MED ORDER — METHOCARBAMOL 500 MG PO TABS
750.0000 mg | ORAL_TABLET | Freq: Two times a day (BID) | ORAL | Status: AC
  Administered 2023-08-02 – 2023-08-04 (×6): 750 mg via ORAL
  Filled 2023-08-02 (×6): qty 2

## 2023-08-02 MED ORDER — ACETAMINOPHEN 325 MG PO TABS
650.0000 mg | ORAL_TABLET | ORAL | Status: DC
  Administered 2023-08-02 – 2023-08-11 (×46): 650 mg via ORAL
  Filled 2023-08-02 (×51): qty 2

## 2023-08-02 MED ORDER — HYDROMORPHONE HCL 1 MG/ML IJ SOLN
0.5000 mg | INTRAMUSCULAR | Status: DC | PRN
Start: 2023-08-02 — End: 2023-08-12
  Administered 2023-08-02 – 2023-08-03 (×4): 1 mg via INTRAVENOUS
  Administered 2023-08-03 – 2023-08-04 (×2): 0.5 mg via INTRAVENOUS
  Administered 2023-08-05: 0.75 mg via INTRAVENOUS
  Administered 2023-08-05: 0.5 mg via INTRAVENOUS
  Administered 2023-08-06 – 2023-08-07 (×2): 1 mg via INTRAVENOUS
  Administered 2023-08-07: 0.5 mg via INTRAVENOUS
  Administered 2023-08-07 – 2023-08-12 (×19): 1 mg via INTRAVENOUS
  Filled 2023-08-02 (×32): qty 1

## 2023-08-02 MED ORDER — ACETAMINOPHEN 325 MG PO TABS
650.0000 mg | ORAL_TABLET | ORAL | Status: DC | PRN
  Administered 2023-08-02: 650 mg via ORAL
  Filled 2023-08-02: qty 2

## 2023-08-02 NOTE — Progress Notes (Signed)
 PHARMACY - ANTICOAGULATION CONSULT NOTE  Pharmacy Consult for Heparin  Indication: atrial fibrillation  Allergies  Allergen Reactions   Aspirin     Avoid due to bariatric surgery   Bupropion Other (See Comments)    Passed out   Nsaids     Avoid due to bariatric surgery   Oxycodone  Hcl     Hallucinations, agitation     Patient Measurements: Height: 6' 4 (193 cm) Weight: 92.4 kg (203 lb 11.3 oz) IBW/kg (Calculated) : 86.8 Heparin  Dosing Weight: 93 kg  Vital Signs: Temp: 97.7 F (36.5 C) (01/09 0742) Temp Source: Oral (01/09 0742) BP: 109/88 (01/09 0730) Pulse Rate: 93 (01/09 0730)  Labs: Recent Labs    08/01/23 0303 08/01/23 2009 08/02/23 0221  HGB 11.4* 11.6* 12.0*  HCT 33.9* 34.9* 36.3*  PLT 167 165 185  APTT 50* 40*  --   LABPROT  --  16.7*  --   INR  --  1.3*  --   HEPARINUNFRC 0.29*  --   --   CREATININE 0.67 0.75 0.81    Estimated Creatinine Clearance: 102.7 mL/min (by C-G formula based on SCr of 0.81 mg/dL).   Medical History: Past Medical History:  Diagnosis Date   ADD (attention deficit disorder)    Anxiety 2007   Asthma as child   Asymptomatic gallstones    Atrial fibrillation (HCC)    Atrial flutter (HCC)    Depression    DJD (degenerative joint disease) of knee    Dysrhythmia    Chronic Atrial Fib- seeing Dr. [facility]   H/O peptic ulcer    past history with GI bleed- cauterization done throught scope   History of kidney stones    pt claims urology discovered a kidney stone last week 05/25/21   Hypercholesterolemia    Orthostatic hypotension    Pneumonia as child   Pneumothorax on left 05/20/2009   Prediabetes    none now   Pulmonary nodules    Sleep apnea    cpap set on 13   Thrombocytopenia (HCC)    pt denies   Thyromegaly    Varicose veins     Medications:  Medications Prior to Admission  Medication Sig Dispense Refill Last Dose/Taking   acetaminophen  (TYLENOL ) 650 MG CR tablet Take 1,300 mg by mouth  every 8 (eight) hours.   07/28/2023 Evening   alfuzosin  (UROXATRAL ) 10 MG 24 hr tablet Take 10 mg by mouth daily with breakfast.   07/27/2023   amphetamine -dextroamphetamine  (ADDERALL) 5 MG tablet Take 5 mg by mouth daily as needed (for ADD).   Past Week   apixaban  (ELIQUIS ) 5 MG TABS tablet Take 5 mg by mouth 2 (two) times daily.   07/27/2023   atorvastatin  (LIPITOR) 20 MG tablet Take 20 mg by mouth at bedtime.   07/26/2023   cetirizine  (ZYRTEC ) 10 MG tablet Take 1 tablet (10 mg total) by mouth daily. 30 tablet 11 07/27/2023   Coenzyme Q10 (CO Q 10 PO) Take 1 capsule by mouth daily.   07/27/2023   desvenlafaxine (PRISTIQ) 50 MG 24 hr tablet Take 50 mg by mouth daily.   07/27/2023   fludrocortisone  (FLORINEF ) 0.1 MG tablet Take 0.1 mg by mouth daily.   07/27/2023   fluticasone  (FLONASE ) 50 MCG/ACT nasal spray Place 2 sprays into both nostrils daily. 16 g 6 07/27/2023   gabapentin  (NEURONTIN ) 300 MG capsule Take 2 capsules (600 mg total) by mouth at bedtime. 180 capsule 0 07/26/2023   Glucosamine-Chondroitin (COSAMIN DS PO) Take  1,500 mg by mouth daily. Rotate with other Joint Health   07/27/2023   latanoprost  (XALATAN ) 0.005 % ophthalmic solution Place 1 drop into both eyes at bedtime.   07/27/2023   LORazepam  (ATIVAN ) 1 MG tablet Take 1 mg by mouth every 4 (four) hours as needed.   07/27/2023   Melatonin 10 MG TABS Take 1 tablet by mouth at bedtime.   07/26/2023   Misc Natural Products (OSTEO BI-FLEX ADV TRIPLE ST PO) Take 1 tablet by mouth daily.   07/27/2023   Multiple Vitamins-Minerals (ONE DAILY MULTIVITAMIN MEN) TABS Take 1 tablet by mouth 2 (two) times daily. CENTRUM SILVER   07/27/2023   pantoprazole  (PROTONIX ) 40 MG tablet Take 40 mg by mouth in the morning and at bedtime.   07/27/2023   Probiotic Product (PROBIOTIC PO) Take 1-2 capsules by mouth daily.   07/27/2023   sildenafil (REVATIO) 20 MG tablet Take 20 mg by mouth daily as needed.   Taking As Needed   sodium chloride  (OCEAN) 0.65 % SOLN nasal spray Place 1 spray  into both nostrils as needed. 30 mL 5 07/26/2023   SUPER B COMPLEX/C PO Take 1 tablet by mouth daily.   07/27/2023   zolpidem  (AMBIEN ) 10 MG tablet Take 5-10 mg by mouth at bedtime.  0 07/26/2023    Assessment: 73 y.o. M presents with acute cholecystitis. Pt on Eliquis  pta for PAF. Last dose given 1/3. Transitioned to Heparin  which was stopped 1/8 AM for cholecystectomy. Held throughout post-operative day 0 due to bleeding loss during surgery. Pharmacy consulted to resume IV Heparin  this AM - no bolus.   CBC stable.  NO further bleeding noted.  Will check both aPTT and Heparin  level - likely can transition to Heparin  level monitoring soon as Eliquis  eliminated from system.   Goal of Therapy:  Heparin  level 0.3-0.7 units/ml aPTT 66-102 seconds Monitor platelets by anticoagulation protocol: Yes   Plan:  Restart heparin  at 1300 units/hr. Will f/u aPTT and heparin  level in 8 hours Monitor closely for any signs or symptoms of bleeding.   Harlene Boga, PharmD, BCPS, BCCCP Please refer to [facility] for [facility] numbers 08/02/2023,8:58 AM

## 2023-08-02 NOTE — Progress Notes (Signed)
 1 Day Post-Op  Subjective: Hb stable.  Tbili stable at 1.5.    On exam, he is resting comfortably. Endorsing incisional pain. Tolerating clears.   ROS: See above, otherwise other systems negative  Objective: Vital signs in last 24 hours: Temp:  [97.6 F (36.4 C)-98.7 F (37.1 C)] 97.7 F (36.5 C) (01/09 0742) Pulse Rate:  [84-129] 93 (01/09 0730) Resp:  [7-24] 14 (01/09 0730) BP: (84-123)/(60-94) 109/88 (01/09 0730) SpO2:  [92 %-98 %] 96 % (01/09 0730) Weight:  [90.5 kg-92.4 kg] 92.4 kg (01/09 0500) Last BM Date : 07/31/23  Intake/Output from previous day: 01/08 0701 - 01/09 0700 In: 4152.7 [P.O.:1200; I.V.:1752.7; IV Piggyback:1200] Out: 2410 [Urine:1300; Drains:610; Blood:500] Intake/Output this shift: No intake/output data recorded.  PE: Gen: NAD Abd: soft, non-distended, appropriately tender, subxiphoid incision with minor erythema, other port sites clean and healthy, JP with low volume of sanguinous output mixed with bile.   Lab Results:  Recent Labs    08/01/23 2009 08/02/23 0221  WBC 12.4* 15.1*  HGB 11.6* 12.0*  HCT 34.9* 36.3*  PLT 165 185   BMET Recent Labs    08/01/23 2009 08/02/23 0221  NA 135 133*  K 4.0 4.8  CL 104 101  CO2 20* 19*  GLUCOSE 146* 154*  BUN 19 20  CREATININE 0.75 0.81  CALCIUM  8.8* 8.8*   PT/INR Recent Labs    08/01/23 2009  LABPROT 16.7*  INR 1.3*   CMP     Component Value Date/Time   NA 133 (L) 08/02/2023 0221   K 4.8 08/02/2023 0221   CL 101 08/02/2023 0221   CO2 19 (L) 08/02/2023 0221   GLUCOSE 154 (H) 08/02/2023 0221   BUN 20 08/02/2023 0221   CREATININE 0.81 08/02/2023 0221   CREATININE 0.77 07/19/2023 1128   CALCIUM  8.8 (L) 08/02/2023 0221   PROT 5.8 (L) 08/02/2023 0221   ALBUMIN  2.5 (L) 08/02/2023 0221   AST 32 08/02/2023 0221   ALT 43 08/02/2023 0221   ALKPHOS 69 08/02/2023 0221   BILITOT 1.5 (H) 08/02/2023 0221   GFRNONAA >60 08/02/2023 0221   GFRAA  10/23/2009 0135    >60        The eGFR  has been calculated using the MDRD equation. This calculation has not been validated in all clinical situations. eGFR's persistently <60 mL/min signify possible Chronic Kidney Disease.   Lipase     Component Value Date/Time   LIPASE 29 07/29/2023 0014       Studies/Results: LONG TERM MONITOR (3-14 DAYS) Result Date: 08/01/2023   Atrial Fibrillation occurred continuously (100% burden), ranging from 53-193 bpm (avg of 94 bpm). Patch Wear Time:  13 days and 13 hours (2024-12-17T13:14:02-499 to 2024-12-31T02:25:18-0500) 2 Ventricular Tachycardia runs occurred, the run with the fastest interval lasting 4 beats with a max rate of 164 bpm, the longest lasting 11.4 secs with an avg rate of 132 bpm. Atrial Fibrillation occurred continuously (100% burden), ranging from 53-193  bpm (avg of 94 bpm). Isolated VEs were rare (<1.0%, 2515), VE Couplets were rare (<1.0%, 67), and VE Triplets were rare (<1.0%, 5). Ventricular Bigeminy was present.   ECHOCARDIOGRAM COMPLETE Result Date: 07/31/2023    ECHOCARDIOGRAM REPORT   Patient Name:   Francisco Padilla Date of Exam: 07/31/2023 Medical Rec #:  [id number]     Height:       76.0 in Accession #:    [id number]    Weight:       205.0 lb Date  of Birth:  [birth year]     BSA:          2.239 m Patient Age:    72 years      BP:           96/74 mmHg Patient Gender: M             HR:           100 bpm. Exam Location:  Inpatient Procedure: 2D Echo, Cardiac Doppler, Color Doppler and Intracardiac            Opacification Agent Indications:    Preoperative Evaluation  History:        Patient has prior history of Echocardiogram examinations, most                 recent 09/11/2014. CAD, Aortic Valve Disease, Arrythmias:Atrial                 Fibrillation; Signs/Symptoms:Hypotension.  Sonographer:    Lanell Maduro Referring Phys: [id number] SHENG L HALEY  Sonographer Comments: Suboptimal parasternal window and suboptimal apical window. IMPRESSIONS  1. The aortic valve is severely  calcified in views obtained. Vmax 2.9 m/s, MG 20 mmHG, AVA 0.96 cm2, DI 0.21. LV SVI low 22 cc/m2. 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 If there are clinical concerns for severe aortic stenosis, would recommend an aortic valve calcium  score for clarification. The aortic valve is calcified. Aortic valve regurgitation is not visualized. Moderate aortic valve stenosis. Aortic valve area, by VTI measures 0.96 cm. Aortic valve mean gradient measures 20.3 mmHg. Aortic valve Vmax measures 2.93 m/s.  2. Left ventricular ejection fraction, by estimation, is 60 to 65%. The left ventricle has normal function. The left ventricle has no regional wall motion abnormalities. Indeterminate diastolic filling due to E-A fusion.  3. Right ventricular systolic function is mildly reduced. The right ventricular size is mildly enlarged. There is normal pulmonary artery systolic pressure. The estimated right ventricular systolic pressure is 30.2 mmHg.  4. Left atrial size was moderately dilated.  5. Right atrial size was mildly dilated.  6. The mitral valve is grossly normal. Trivial mitral valve regurgitation. No evidence of mitral stenosis.  7. The inferior vena cava is normal in size with greater than 50% respiratory variability, suggesting right atrial pressure of 3 mmHg. FINDINGS  Left Ventricle: Left ventricular ejection fraction, by estimation, is 60 to 65%. The left ventricle has normal function. The left ventricle has no regional wall motion abnormalities. Definity  contrast agent was given IV to delineate the left ventricular  endocardial borders. The left ventricular internal cavity size was normal in size. There is no left ventricular hypertrophy. Indeterminate diastolic filling due to E-A fusion. Right Ventricle: The right ventricular size is mildly enlarged. No increase in right ventricular wall thickness. Right ventricular systolic function is mildly reduced. There is normal  pulmonary artery systolic pressure. The tricuspid regurgitant velocity  is 1.95 m/s, and with an assumed right atrial pressure of 15 mmHg, the estimated right ventricular systolic pressure is 30.2 mmHg. Left Atrium: Left atrial size was moderately dilated. Right Atrium: Right atrial size was mildly dilated. Pericardium: There is no evidence of pericardial effusion. Mitral Valve: The mitral valve is grossly normal. Trivial mitral valve regurgitation. No evidence of mitral valve stenosis. Tricuspid Valve: The tricuspid valve is grossly normal. Tricuspid valve regurgitation is trivial. No evidence of tricuspid stenosis. Aortic Valve: The aortic valve is severely calcified in views obtained. Vmax 2.9  m/s, MG 20 mmHG, AVA 0.96 cm2, DI 0.21. LV SVI low 22 cc/m2. 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 If there are clinical concerns for severe aortic stenosis, would recommend an aortic valve calcium  score for clarification. The aortic valve is calcified. Aortic valve regurgitation is not visualized. Moderate aortic stenosis is present. Aortic valve mean gradient measures 20.3 mmHg. Aortic valve peak gradient measures 34.3 mmHg. Aortic valve area, by VTI measures 0.96 cm. Pulmonic Valve: The pulmonic valve was grossly normal. Pulmonic valve regurgitation is not visualized. No evidence of pulmonic stenosis. Aorta: The aortic root and ascending aorta are structurally normal, with no evidence of dilitation. Venous: The inferior vena cava is normal in size with greater than 50% respiratory variability, suggesting right atrial pressure of 3 mmHg. IAS/Shunts: The atrial septum is grossly normal.  LEFT VENTRICLE PLAX 2D LVIDd:         4.70 cm      Diastology LVIDs:         3.30 cm      LV e' medial:    10.40 cm/s LV PW:         1.00 cm      LV E/e' medial:  5.8 LV IVS:        0.90 cm      LV e' lateral:   14.60 cm/s LVOT diam:     2.40 cm      LV E/e' lateral: 4.1 LV SV:         50 LV  SV Index:   22 LVOT Area:     4.52 cm  LV Volumes (MOD) LV vol d, MOD A2C: 61.7 ml LV vol d, MOD A4C: 113.0 ml LV vol s, MOD A2C: 29.6 ml LV vol s, MOD A4C: 45.4 ml LV SV MOD A2C:     32.1 ml LV SV MOD A4C:     113.0 ml LV SV MOD BP:      51.5 ml RIGHT VENTRICLE            IVC RV Basal diam:  4.20 cm    IVC diam: 3.20 cm RV Mid diam:    3.40 cm RV S prime:     9.48 cm/s TAPSE (M-mode): 1.7 cm LEFT ATRIUM            Index        RIGHT ATRIUM           Index LA diam:      5.60 cm  2.50 cm/m   RA Area:     17.50 cm LA Vol (A4C): 115.0 ml 51.37 ml/m  RA Volume:   41.50 ml  18.54 ml/m  AORTIC VALVE AV Area (Vmax):    0.99 cm AV Area (Vmean):   0.98 cm AV Area (VTI):     0.96 cm AV Vmax:           292.98 cm/s AV Vmean:          200.557 cm/s AV VTI:            0.526 m AV Peak Grad:      34.3 mmHg AV Mean Grad:      20.3 mmHg LVOT Vmax:         64.30 cm/s LVOT Vmean:        43.400 cm/s LVOT VTI:          0.111 m LVOT/AV VTI ratio: 0.21  AORTA Ao Asc diam: 3.50 cm MITRAL  VALVE               TRICUSPID VALVE MV Area (PHT): 6.32 cm    TR Peak grad:   15.2 mmHg MV Decel Time: 120 msec    TR Vmax:        195.00 cm/s MV E velocity: 60.20 cm/s MV A velocity: 40.30 cm/s  SHUNTS MV E/A ratio:  1.49        Systemic VTI:  0.11 m                            Systemic Diam: 2.40 cm Francisco Decent MD Electronically signed by Francisco Decent MD Signature Date/Time: 07/31/2023/10:21:24 AM    Final     Anti-infectives: Anti-infectives (From admission, onward)    Start     Dose/Rate Route Frequency Ordered Stop   07/29/23 0800  piperacillin -tazobactam (ZOSYN ) IVPB 3.375 g        3.375 g 12.5 mL/hr over 240 Minutes Intravenous Every 8 hours 07/29/23 0207     07/29/23 0030  piperacillin -tazobactam (ZOSYN ) IVPB 3.375 g        3.375 g 100 mL/hr over 30 Minutes Intravenous  Once 07/29/23 0020 07/29/23 0147        Assessment/Plan Gangrenous cholecystitis s/p lap subtotal cholecystectomy 1/8 -Okay to advance to a regular diet  today - Monitor drain output to determine need for ERCP - Trend LFTs - Okay for heparin  gtt today. No bolus. Monitor for signs of bleeding - Continue antibiotics 5 days post op   LOS: 4 days    Francisco Padilla , MD [facility] Surgery 08/02/2023, 8:35 AM Please see Amion for pager number during day hours 7:00am-4:30pm or 7:00am -11:30am on weekends

## 2023-08-02 NOTE — Progress Notes (Signed)
 Pt AOx4 and c/o 8/10 pain in R lateral abdomen. Pt anxious fidgeting and restless Pt aware that ULTRAM  and Tylenol  isn't due until 1022. Pt also states he is anxious but understands that his PRN ativan  isn't due until 1049. Pt asking for for Pain medication will notify MD

## 2023-08-02 NOTE — Anesthesia Postprocedure Evaluation (Signed)
 Anesthesia Post Note  Patient: Francisco Padilla  Procedure(s) Performed: LAPAROSCOPIC CHOLECYSTECTOMY WITH ICG DYE (Abdomen)     Patient location during evaluation: PACU Anesthesia Type: General Level of consciousness: awake and alert Pain management: pain level controlled Vital Signs Assessment: post-procedure vital signs reviewed and stable Respiratory status: spontaneous breathing, nonlabored ventilation and respiratory function stable Cardiovascular status: stable Anesthetic complications: no   No notable events documented.  Last Vitals:  Vitals:   08/02/23 0730 08/02/23 0742  BP: 109/88   Pulse: 93   Resp: 14   Temp:  36.5 C  SpO2: 96%     Last Pain:  Vitals:   08/02/23 0742  TempSrc: Oral  PainSc:                  Debby BRAVO Like

## 2023-08-02 NOTE — Progress Notes (Signed)
 Pharmacy Electrolyte Replacement  Recent Labs:  Recent Labs    08/02/23 0221  K 4.8  MG 1.9  CREATININE 0.81    Low Critical Values (K </= 2.5, Phos </= 1, Mg </= 1) Present: None  MD Contacted: n/a  Plan: Magnesium  2g IV x1  Harlene Boga, PharmD, BCPS, BCCCP Please refer to [facility] for [facility] Pharmacy numbers 08/02/2023, 9:59 AM

## 2023-08-02 NOTE — Evaluation (Signed)
 Physical Therapy Re-Evaluation Patient Details Name: Francisco Padilla MRN: [id number] DOB: November 10, 1950 Today's Date: 08/02/2023  History of Present Illness  Pt is a 73 y/o male admitted for sepsis due to acute cholecystitis and AKI. Gangrenous cholecystitis s/p lap subtotal cholecystectomy 1/8.  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 Demonstrates increased difficulty with transfer, up to mod assist to stand. Limited by weakness and fatigue. Hypotensive (pre-activity 90/66 in supine; standing 79/61 + dizziness; seated 85/73. HR increased to 140s while standing attempting to void. Takes several steps along bed with min assist for balance and walker control. Still favor SNF for post acute rehab to regain independence. Will follow and progress as tolerated during admission. Patient will continue to benefit from skilled physical therapy services to further improve independence with functional mobility. Patient will benefit from continued inpatient follow up therapy, <3 hours/day       If plan is discharge home, recommend the following: A little help with walking and/or transfers;A little help with bathing/dressing/bathroom;Assistance with cooking/housework;Direct supervision/assist for medications management;Direct supervision/assist for financial management;Assist for transportation;Supervision due to cognitive status;Help with stairs or ramp for entrance   Can travel by private vehicle   Yes    Equipment Recommendations Rolling walker (2 wheels);BSC/3in1  Recommendations for Other Services       Functional Status Assessment Patient has had a recent decline in their functional status and demonstrates the ability to make significant improvements in function in a reasonable and predictable amount of time.     Precautions / Restrictions Precautions Precautions: Fall Precaution Comments:  JP drain RUQ. Montior HR and BP Restrictions Weight Bearing Restrictions Per Provider Order: No      Mobility  Bed Mobility Overal bed mobility: Needs Assistance Bed Mobility: Supine to Sit, Sit to Supine     Supine to sit: HOB elevated, Contact guard Sit to supine: Contact guard assist, HOB elevated   General bed mobility comments: CGA to rise, assisted with line/lead management. CGA to return to bed, effortful to raise LEs back into bed.    Transfers Overall transfer level: Needs assistance Equipment used: Rolling walker (2 wheels) Transfers: Sit to/from Stand Sit to Stand: Mod assist           General transfer comment: Practiced transfer from bed with min assist first attempt, mod assist second attempt, likely more fatigued. Cues for technique, RW for support upon rising.    Ambulation/Gait Ambulation/Gait assistance: Min assist Gait Distance (Feet): 3 Feet Assistive device: Rolling walker (2 wheels) Gait Pattern/deviations: Step-to pattern, Trunk flexed, Leaning posteriorly, Shuffle Gait velocity: reduced Gait velocity interpretation: <1.31 ft/sec, indicative of household ambulator Pre-gait activities: Engineer, Water march, weight shifting. General Gait Details: Took a few steps forward and backwards, with posterior LOB, heavy min assist to correct balace at times. Lateral steps approx 3 feet along bed with assist for balance and RW control.  Stairs            Wheelchair Mobility     Tilt Bed    Modified Rankin (Stroke Patients Only)       Balance Overall balance assessment: Needs assistance Sitting-balance support: No upper extremity supported, Feet supported Sitting balance-Leahy Scale: Fair   Postural control: Right lateral lean Standing balance support: Single extremity supported, Reliant on assistive device for balance Standing balance-Leahy Scale: Poor Standing balance comment: Stand with single UE on RW for support, posterior instability.  Pertinent Vitals/Pain Pain Assessment Pain Assessment: Faces Faces Pain Scale: Hurts a little bit Pain Location: RUQ Pain Descriptors / Indicators: Aching Pain Intervention(s): Monitored during session, Repositioned    Home Living Family/patient expects to be discharged to:: Private residence Living Arrangements: Alone Available Help at Discharge: Friend(s);Available PRN/intermittently Type of Home: House Home Access: Ramped entrance     Alternate Level Stairs-Number of Steps: flight Home Layout: Two level Home Equipment: None Additional Comments: pt reports no DME at home from prior L TKA - need to confirm. Pt reports toilet upstairs does not work where bedroom is but toilet downstairs works. reports no heat on first floor of home    Prior Function Prior Level of Function : Independent/Modified Independent;Driving             Mobility Comments: no AD, cycles, recently working with OP PT ADLs Comments: Indep with ADLs, IADLs     Extremity/Trunk Assessment   Upper Extremity Assessment Upper Extremity Assessment: Defer to OT evaluation    Lower Extremity Assessment Lower Extremity Assessment: Generalized weakness    Cervical / Trunk Assessment Cervical / Trunk Assessment: Kyphotic  Communication   Communication Communication: No apparent difficulties (tangential at times)  Cognition Arousal: Alert Behavior During Therapy: WFL for tasks assessed/performed Overall Cognitive Status: No family/caregiver present to determine baseline cognitive functioning                               Problem Solving: Slow processing General Comments: tangential thought at times        General Comments General comments (skin integrity, edema, etc.): Urinary urgency and discomfort, unable to urinate despite attempting several times in several different positions.    Exercises     Assessment/Plan    PT Assessment Patient needs  continued PT services  PT Problem List Decreased strength;Decreased activity tolerance;Decreased balance;Decreased mobility;Decreased cognition;Pain       PT Treatment Interventions DME instruction;Gait training;Stair training;Functional mobility training;Therapeutic activities;Neuromuscular re-education;Balance training;Therapeutic exercise;Patient/family education;Cognitive remediation    PT Goals (Current goals can be found in the Care Plan section)  Acute Rehab PT Goals Patient Stated Goal: to return to independence PT Goal Formulation: With patient Time For Goal Achievement: 08/16/23 Potential to Achieve Goals: Good    Frequency Min 1X/week     Co-evaluation               AM-PAC PT 6 Clicks Mobility  Outcome Measure Help needed turning from your back to your side while in a flat bed without using bedrails?: A Little Help needed moving from lying on your back to sitting on the side of a flat bed without using bedrails?: A Little Help needed moving to and from a bed to a chair (including a wheelchair)?: A Little Help needed standing up from a chair using your arms (e.g., wheelchair or bedside chair)?: A Little Help needed to walk in hospital room?: A Lot Help needed climbing 3-5 steps with a railing? : Total 6 Click Score: 15    End of Session Equipment Utilized During Treatment: Gait belt Activity Tolerance: Patient tolerated treatment well Patient left: in bed;with call bell/phone within reach;with bed alarm set Nurse Communication: Mobility status PT Visit Diagnosis: Other abnormalities of gait and mobility (R26.89);Muscle weakness (generalized) (M62.81);Unsteadiness on feet (R26.81);Difficulty in walking, not elsewhere classified (R26.2);Dizziness and giddiness (R42)    Time: 8799-8751 PT Time Calculation (min) (ACUTE ONLY): 48 min   Charges:   PT Evaluation $  PT Re-evaluation: 1 Re-eval PT Treatments $Therapeutic Activity: 23-37 mins PT General Charges $$  ACUTE PT VISIT: 1 Visit         Leontine Roads, PT, DPT [facility]  Rehabilitation Services Physical Therapist Office: [phone] Website: [email or web address]   Leontine GORMAN Roads 08/02/2023, 1:57 PM

## 2023-08-02 NOTE — Hospital Course (Addendum)
 Patient is a 73 year old male with pertinent PMH OSA on CPAP, HLD, chronic hypotension, PAF on Eliquis , PVD presents to [facility] ED on 1/5 with cholecystitis.   Patient complaining of weakness/fatigue going on for about a month.  Patient also having nausea, abdominal pain, diarrhea.  Abdominal pain worse in right upper quadrant.  Denies fever/chills.  Came to [facility] ED on 1/5 for further workup.  On arrival patient noted to be slightly tachycardic 100s and mildly hypotensive 93/73.  CT ABD/pelvis showing acute cholecystitis.  Surgery consulted.  Given IV fluids and Zosyn .  Awaiting surgery 48 hours for Eliquis  washout. Cards consulted for preoperative evaluation of cholecystectomy. Echo on 1/7 showing lvef 60-65%; severe aortic stenosis.  On 1/8 patient taken to the OR for cholecystectomy.  While in the OR patient had tachycardia 150s and started on esmolol .  Patient later became hypotensive and started on neo.  Postop patient taken to ICU while on pressors.  PCCM consulted.  1/5 admitted with cholecystitis 1/8 s/p cholecystectomy; given esmolol  for HR 150s then became hypotensive on neo-; postop taken to ICU and PCCM consulted 1/10 episode of rapid A-fib with RVR in the 150s, systolic pressure in the 70s.  01/13 no clinical signs of bile leak, and no need for ERCP.  01/14 worsening drain out put, suggesting resumption of bile leak. Plan for transfer to tertiary care.

## 2023-08-02 NOTE — Progress Notes (Signed)
 OT Cancellation Note  Patient Details Name: Francisco Padilla MRN: [id number] DOB: [birth year]   Cancelled Treatment:    Reason Eval/Treat Not Completed: Other (comment)  Just finished working with PT; some hypotension and fatigue reported. Will follow up again for OT re-eval as schedule permits.  Francisco Padilla  [name] 08/02/2023, 12:46 PM

## 2023-08-02 NOTE — Consult Note (Addendum)
 NAME:  Francisco Padilla, MRN:  [id number], DOB:  Jul 29, 1950, LOS: 4 ADMISSION DATE:  07/28/2023, CONSULTATION DATE:  1/8 REFERRING MD:  Dr. Bryn, CHIEF COMPLAINT:  Cholecystitis s/p chole; hypotension   History of Present Illness:  Patient is a 73 year old male with pertinent PMH OSA on CPAP, HLD, chronic hypotension, PAF on Eliquis , PVD presents to [facility] ED on 1/5 with cholecystitis.  Patient complaining of weakness/fatigue going on for about a month.  Patient also having nausea, abdominal pain, diarrhea.  Abdominal pain worse in right upper quadrant.  Denies fever/chills.  Came to [facility] ED on 1/5 for further workup.  On arrival patient noted to be slightly tachycardic 100s and mildly hypotensive 93/73.  CT ABD/pelvis showing acute cholecystitis.  Surgery consulted.  Given IV fluids and Zosyn .  Awaiting surgery 48 hours for Eliquis  washout. Cards consulted for preoperative evaluation of cholecystectomy. Echo on 1/7 showing lvef 60-65%; severe aortic stenosis.  On 1/8 patient taken to the OR for cholecystectomy.  While in the OR patient had tachycardia 150s and started on esmolol .  Patient later became hypotensive and started on neo.  Postop patient taken to ICU while on pressors.  PCCM consulted.  Pertinent  Medical History   Past Medical History:  Diagnosis Date   ADD (attention deficit disorder)    Anxiety 2007   Asthma as child   Asymptomatic gallstones    Atrial fibrillation (HCC)    Atrial flutter (HCC)    Depression    DJD (degenerative joint disease) of knee    Dysrhythmia    Chronic Atrial Fib- seeing Dr. DOROTHA [facility]   H/O peptic ulcer    past history with GI bleed- cauterization done throught scope   History of kidney stones    pt claims urology discovered a kidney stone last week 05/25/21   Hypercholesterolemia    Orthostatic hypotension    Pneumonia as child   Pneumothorax on left 05/20/2009   Prediabetes    none now   Pulmonary nodules    Sleep apnea    cpap set  on 13   Thrombocytopenia (HCC)    pt denies   Thyromegaly    Varicose veins      Significant Hospital Events: Including procedures, antibiotic start and stop dates in addition to other pertinent events   1/5 admitted with cholecystitis 1/8 s/p cholecystectomy; given esmolol  for HR 150s then became hypotensive on neo-; postop taken to ICU and PCCM consulted  Interim History / Subjective:  Patient BP stable Patient still having some abd pain  Objective   Blood pressure 109/88, pulse 93, temperature 97.7 F (36.5 C), temperature source Oral, resp. rate 14, height 6' 4 (1.93 m), weight 92.4 kg, SpO2 96%.        Intake/Output Summary (Last 24 hours) at 08/02/2023 0826 Last data filed at 08/02/2023 0600 Gross per 24 hour  Intake 4152.7 ml  Output 2410 ml  Net 1742.7 ml   Filed Weights   07/31/23 0635 08/01/23 1828 08/02/23 0500  Weight: 93 kg 90.5 kg 92.4 kg    Examination: General:  NAD HEENT: MM pink/dry; [place] in place Neuro: Aox3; MAE CV: s1s2, irregularly irregular rate 100s PULM:  dim clear BS bilaterally GI: soft, 8/10 abd pain; drain in place Extremities: warm/dry, no edema  Skin: no rashes or lesions    Resolved Hospital Problem list     Assessment & Plan:  Acute gangrenous cholecystitis s/p partial cholecystectomy Sepsis Plan: -surgery following; appreciate recs -cont zosyn ; follow  cultures -add prn dilaudid  for pain control; cont tylenol   Acute on chronic hypotension -On Florinef  at home; given esmolol  for tachycardia and OR then became hypotensive requiring neo- Plan: -BP stable -cont florinef  -consider adding midodrine   Chronic afib Severe aortic stenosis HLD -HR 150s in OR given esmolol  then hypotensive Plan: -cards following; appreciate recs -tele monitoring -per surgery ok to try heparin  w/ no bolus; monitor for bleeding -if having continual afib RVR consider amio for rate control w/ chronic  hypotension -statin  AKI Hypokalemia Hyponatremia: likely hypovolemia Plan: -given iv fluids; push po fluids today -Trend BMP / urinary output -Replace electrolytes as indicated -Avoid nephrotoxic agents, ensure adequate renal perfusion  Peripheral neuropathy Plan: -gabapentin   OSA on CPAP Plan: -cont home cpap qhs   Best Practice (right click and Reselect all SmartList Selections daily)   Diet/type: clear liquids DVT prophylaxis systemic heparin  Pressure ulcer(s): N/A GI prophylaxis: PPI Lines: N/A Foley:  N/A Code Status:  full code Last date of multidisciplinary goals of care discussion [1/9 patient updated at bedside]  Labs   CBC: Recent Labs  Lab 07/30/23 0225 07/31/23 0255 08/01/23 0303 08/01/23 2009 08/02/23 0221  WBC 19.1* 15.5* 13.3* 12.4* 15.1*  HGB 12.0* 11.5* 11.4* 11.6* 12.0*  HCT 36.1* 34.0* 33.9* 34.9* 36.3*  MCV 92.3 91.4 91.9 93.1 93.6  PLT 180 175 167 165 185    Basic Metabolic Panel: Recent Labs  Lab 07/30/23 0225 07/31/23 0255 08/01/23 0303 08/01/23 2009 08/02/23 0221  NA 135 135 132* 135 133*  K 3.4* 3.4* 3.4* 4.0 4.8  CL 104 104 99 104 101  CO2 21* 22 23 20* 19*  GLUCOSE 109* 105* 107* 146* 154*  BUN 30* 20 18 19 20   CREATININE 0.74 0.73 0.67 0.75 0.81  CALCIUM  9.0 8.9 8.8* 8.8* 8.8*  MG  --  1.7 1.8 1.7 1.9   GFR: Estimated Creatinine Clearance: 102.7 mL/min (by C-G formula based on SCr of 0.81 mg/dL). Recent Labs  Lab 07/29/23 0022 07/29/23 0459 07/31/23 0255 08/01/23 0303 08/01/23 2009 08/02/23 0221  WBC  --    < > 15.5* 13.3* 12.4* 15.1*  LATICACIDVEN 1.4  --   --   --   --   --    < > = values in this interval not displayed.    Liver Function Tests: Recent Labs  Lab 07/30/23 0225 07/31/23 0255 08/01/23 0303 08/01/23 2009 08/02/23 0221  AST 27 48* 43* 34 32  ALT 23 49* 53* 46* 43  ALKPHOS 70 78 78 73 69  BILITOT 0.9 1.4* 1.4* 1.3* 1.5*  PROT 5.6* 5.5* 5.3* 5.5* 5.8*  ALBUMIN  2.6* 2.4* 2.2* 2.5*  2.5*   Recent Labs  Lab 07/29/23 0014  LIPASE 29   No results for input(s): AMMONIA in the last 168 hours.  ABG    Component Value Date/Time   TCO2 30 10/21/2009 1950     Coagulation Profile: Recent Labs  Lab 08/01/23 2009  INR 1.3*    Cardiac Enzymes: No results for input(s): CKTOTAL, CKMB, CKMBINDEX, TROPONINI in the last 168 hours.  HbA1C: No results found for: HGBA1C  CBG: Recent Labs  Lab 08/01/23 1830 08/01/23 1920 08/01/23 2317 08/02/23 0322 08/02/23 0740  GLUCAP 132* 141* 131* 138* 144*    Review of Systems:   Review of Systems  Constitutional:  Negative for fever.  Respiratory:  Negative for shortness of breath.   Cardiovascular:  Negative for chest pain.  Gastrointestinal:  Positive for nausea. Negative for vomiting.  Past Medical History:  He,  has a past medical history of ADD (attention deficit disorder), Anxiety (2007), Asthma (as child), Asymptomatic gallstones, Atrial fibrillation (HCC), Atrial flutter (HCC), Depression, DJD (degenerative joint disease) of knee, Dysrhythmia, H/O peptic ulcer, History of kidney stones, Hypercholesterolemia, Orthostatic hypotension, Pneumonia (as child), Pneumothorax on left (05/20/2009), Prediabetes, Pulmonary nodules, Sleep apnea, Thrombocytopenia (HCC), Thyromegaly, and Varicose veins.   Surgical History:   Past Surgical History:  Procedure Laterality Date   BIOPSY  04/18/2018   Procedure: BIOPSY;  Surgeon: Saintclair Jasper, MD;  Location: [facility];  Service: Gastroenterology;;   ESOPHAGOGASTRODUODENOSCOPY N/A 04/18/2018   Procedure: ESOPHAGOGASTRODUODENOSCOPY (EGD);  Surgeon: Saintclair Jasper, MD;  Location: [facility];  Service: Gastroenterology;  Laterality: N/A;   ESOPHAGOGASTRODUODENOSCOPY (EGD) WITH PROPOFOL  N/A 01/05/2015   Procedure: ESOPHAGOGASTRODUODENOSCOPY (EGD) WITH PROPOFOL ;  Surgeon: Gladis MARLA Louder, MD;  Location: [facility];  Service: Endoscopy;  Laterality: N/A;    EXTRACORPOREAL SHOCK WAVE LITHOTRIPSY Left 08/11/2021   Procedure: LEFT EXTRACORPOREAL SHOCK WAVE LITHOTRIPSY (ESWL);  Surgeon: Selma Donnice SAUNDERS, MD;  Location: [facility];  Service: Urology;  Laterality: Left;   GASTRIC ROUX-EN-Y     '08-[name] (weight stable around 302 #   KNEE ARTHROSCOPY     NASAL SEPTOPLASTY W/ TURBINOPLASTY Bilateral 11/13/2022   Procedure: NASAL SEPTOPLASTY WITH BILATERAL TURBINATE REDUCTION;  Surgeon: Karis Clunes, MD;  Location: [facility];  Service: ENT;  Laterality: Bilateral;   TONSILLECTOMY     TOTAL KNEE ARTHROPLASTY Left 06/03/2021   Procedure: LEFT TOTAL KNEE ARTHROPLASTY;  Surgeon: Harden Jerona GAILS, MD;  Location[facility];  Service: Orthopedics;  Laterality: Left;   ULNAR NERVE TRANSPOSITION Right 15 yrs ago     Social History:   reports that he has never smoked. He has never used smokeless tobacco. He reports that he does not drink alcohol and does not use drugs.   Family History:  His family history includes Heart attack in his father; Heart disease in his father and mother; Heart failure in his mother. There is no history of Stroke.   Allergies Allergies  Allergen Reactions   Aspirin     Avoid due to bariatric surgery   Bupropion Other (See Comments)    Passed out   Nsaids     Avoid due to bariatric surgery   Oxycodone  Hcl     Hallucinations, agitation      Home Medications  Prior to Admission medications   Medication Sig Start Date End Date Taking? Authorizing Provider  acetaminophen  (TYLENOL ) 650 MG CR tablet Take 1,300 mg by mouth every 8 (eight) hours.   Yes [provider]  alfuzosin  (UROXATRAL ) 10 MG 24 hr tablet Take 10 mg by mouth daily with breakfast.   Yes [provider]  amphetamine -dextroamphetamine  (ADDERALL) 5 MG tablet Take 5 mg by mouth daily as needed (for ADD).   Yes [provider]  apixaban  (ELIQUIS ) 5 MG TABS tablet Take 5 mg by mouth 2 (two) times daily.   Yes [provider]  atorvastatin  (LIPITOR) 20 MG tablet Take 20 mg by mouth at bedtime. 04/03/21  Yes [provider]  cetirizine  (ZYRTEC ) 10 MG tablet Take 1 tablet (10 mg total) by mouth daily. 06/14/23  Yes Soldatova, Liuba, MD  Coenzyme Q10 (CO Q 10 PO) Take 1 capsule by mouth daily.   Yes [provider]  desvenlafaxine (PRISTIQ) 50 MG 24 hr tablet Take 50 mg by mouth daily. 02/05/23  Yes [provider]  fludrocortisone  (FLORINEF )  0.1 MG tablet Take 0.1 mg by mouth daily. 03/07/19  Yes [provider]  fluticasone  (FLONASE ) 50 MCG/ACT nasal spray Place 2 sprays into both nostrils daily. 06/14/23  Yes Soldatova, Liuba, MD  gabapentin  (NEURONTIN ) 300 MG capsule Take 2 capsules (600 mg total) by mouth at bedtime. 07/10/23 10/08/23 Yes Gershon Donnice SAUNDERS, DPM  Glucosamine-Chondroitin (COSAMIN DS PO) Take 1,500 mg by mouth daily. Rotate with other Joint Health   Yes [provider]  latanoprost  (XALATAN ) 0.005 % ophthalmic solution Place 1 drop into both eyes at bedtime. 04/23/21  Yes [provider]  LORazepam  (ATIVAN ) 1 MG tablet Take 1 mg by mouth every 4 (four) hours as needed.   Yes [provider]  Melatonin 10 MG TABS Take 1 tablet by mouth at bedtime.   Yes [provider]  Misc Natural Products (OSTEO BI-FLEX ADV TRIPLE ST PO) Take 1 tablet by mouth daily.   Yes [provider]  Multiple Vitamins-Minerals (ONE DAILY MULTIVITAMIN MEN) TABS Take 1 tablet by mouth 2 (two) times daily. CENTRUM SILVER 04/13/06  Yes [provider]  pantoprazole  (PROTONIX ) 40 MG tablet Take 40 mg by mouth in the morning and at bedtime. 10/24/09  Yes [provider]  Probiotic Product (PROBIOTIC PO) Take 1-2 capsules by mouth daily.   Yes [provider]  sildenafil (REVATIO) 20 MG tablet Take 20 mg by mouth daily as needed.   Yes [provider]  sodium chloride  (OCEAN) 0.65 % SOLN nasal spray Place 1 spray  into both nostrils as needed. 06/14/23  Yes Soldatova, Liuba, MD  SUPER B COMPLEX/C PO Take 1 tablet by mouth daily.   Yes [provider]  zolpidem  (AMBIEN ) 10 MG tablet Take 5-10 mg by mouth at bedtime. 08/12/14  Yes [provider]          JD Emilio RIGGERS [facility] 08/02/2023, 8:26 AM  Please see [email or web address] for pager details.  From 7A-7P if no response, please call [phone]. After hours, please call ELink [phone].

## 2023-08-02 NOTE — Progress Notes (Signed)
 Patient Name: Francisco Padilla Date of Encounter: 08/02/2023 [facility] Cardiologist: Oneil Parchment, MD   Interval Summary  .    Patient seen in the medical ICU status post subtotal cholecystectomy secondary to gangrenous cholecystitis Denies anginal chest pain or heart failure symptoms. Remains in A-fib with controlled ventricular rates.  Vital Signs .    Vitals:   08/02/23 1100 08/02/23 1106 08/02/23 1130 08/02/23 1144  BP: 98/74  94/67 (!) 84/68  Pulse: 94 99 96 96  Resp: 10 11 10  (!) 8  Temp:      TempSrc:      SpO2: 97% 96% 95% 96%  Weight:      Height:        Intake/Output Summary (Last 24 hours) at 08/02/2023 1225 Last data filed at 08/02/2023 1100 Gross per 24 hour  Intake 6198.74 ml  Output 1900 ml  Net 4298.74 ml      08/02/2023    5:00 AM 08/01/2023    6:28 PM 07/31/2023    6:35 AM  Last 3 Weights  Weight (lbs) 203 lb 11.3 oz 199 lb 8.3 oz 205 lb 0.4 oz  Weight (kg) 92.4 kg 90.5 kg 93 kg      Telemetry/ECG    Telemetry: Atrial fibrillation with controlled ventricular rate- Personally Reviewed  Echocardiogram 07/31/2023: 1. The aortic valve is severely calcified in views obtained. Vmax 2.9  m/s, MG 20 mmHG, AVA 0.96 cm2, DI 0.21. LV SVI low 22 cc/m2. Findings are  concerning for paradoxical low flow low gradient severe aortic stenosis.  If there are clinical concerns for  severe aortic stenosis, would recommend an aortic valve calcium  score for  clarification. The aortic valve is calcified. Aortic valve regurgitation  is not visualized. Moderate aortic valve stenosis. Aortic valve area, by  VTI measures 0.96 cm. Aortic  valve mean gradient measures 20.3 mmHg. Aortic valve Vmax measures 2.93  m/s.   2. Left ventricular ejection fraction, by estimation, is 60 to 65%. The  left ventricle has normal function. The left ventricle has no regional  wall motion abnormalities. Indeterminate diastolic filling due to E-A  fusion.   3. Right ventricular systolic  function is mildly reduced. The right  ventricular size is mildly enlarged. There is normal pulmonary artery  systolic pressure. The estimated right ventricular systolic pressure is  30.2 mmHg.   4. Left atrial size was moderately dilated.   5. Right atrial size was mildly dilated.   6. The mitral valve is grossly normal. Trivial mitral valve  regurgitation. No evidence of mitral stenosis.   7. The inferior vena cava is normal in size with greater than 50%  respiratory variability, suggesting right atrial pressure of 3 mmHg.   Physical Exam .    General: Age-appropriate male, in no acute distress, hemodynamically stable. HEENT: Normocephalic, atraumatic, no JVP, trachea midline, dry mucous membranes. Cardiac: Irregularly irregular, systolic ejection murmur at the second muscle space, no gallops or rubs. Lungs: Clear to auscultation bilaterally: No wheezes rales or rhonchi's. GI: JP drain noted.  Soft, no guarding, bowel sounds present. Extremities: Warm to touch, no pitting edema  Assessment & Plan .     Impression: Preoperative cardiovascular examination. Aortic stenosis, severe, paradoxical low-flow low gradient Permanent atrial fibrillation. Long-term anticoagulation. Acute cholecystitis. Chronic hypotension.  Recommendations: Cardiology was consulted during this hospitalization as he presents with acute cholecystitis and being worked up for cholecystectomy.  Given his history of aortic stenosis/permanent atrial fibrillation on oral anticoagulation he wanted to  discuss his care from a cardiology standpoint during his perioperative period.  Patient was risk stratified as at least moderate risk which was not modifiable.  Given his valvular heart disease he was recommended to avoid prolonged period of hypertension.  Based on EMR patient did have episodes of A-fib with RVR during his surgery, esmolol  started, due to hypotension required a short course of pressor support.  He was  transferred to ICU for further monitoring.  At the time of evaluation he denies anginal chest pain or heart failure symptoms.  Telemetry reviewed A-fib (permanent) with controlled ventricular rate.  ICU staff informs that he will be started on IV heparin  later today as he is cleared by surgery.  Transition to oral anticoagulation closer to discharge / when cleared by surgery team as long as the overall risk is not amplified.   Management per primary team.  Patient is advised to follow-up with his primary cardiologist once discharged for further evaluation and management of paradoxical low-flow low gradient aortic stenosis.  [facility] will sign off.   Medication Recommendations: Transition back to oral anticoagulation once cleared by surgical team.   Other recommendations (labs, testing, etc): N/A  Follow up as an outpatient: Recommend outpatient cardiology follow-up visit in 4 weeks postdischarge.  Will arrange care.  If he has prolonged hospitalization please reach out to have this appointment rescheduled.   For questions or updates, please contact [facility] Please consult [email or web address] for contact info under     Signed, Madonna Large, DO, [facility]  [facility]  [address] [place], [place] [address] Pager: [phone] Office: [phone]

## 2023-08-02 NOTE — Progress Notes (Signed)
 PHARMACY - ANTICOAGULATION CONSULT NOTE  Pharmacy Consult for Heparin  Indication: atrial fibrillation  Allergies  Allergen Reactions   Aspirin     Avoid due to bariatric surgery   Bupropion Other (See Comments)    Passed out   Nsaids     Avoid due to bariatric surgery   Oxycodone  Hcl     Hallucinations, agitation     Patient Measurements: Height: 6' 4 (193 cm) Weight: 92.4 kg (203 lb 11.3 oz) IBW/kg (Calculated) : 86.8 Heparin  Dosing Weight: 93 kg  Vital Signs: Temp: 97.3 F (36.3 C) (01/09 1951) Temp Source: Axillary (01/09 1950) BP: 90/67 (01/09 2021) Pulse Rate: 91 (01/09 2021)  Labs: Recent Labs    08/01/23 0303 08/01/23 2009 08/02/23 0221 08/02/23 1955  HGB 11.4* 11.6* 12.0* 12.5*  HCT 33.9* 34.9* 36.3* 37.3*  PLT 167 165 185 216  APTT 50* 40*  --  38*  LABPROT  --  16.7*  --   --   INR  --  1.3*  --   --   HEPARINUNFRC 0.29*  --   --  0.12*  CREATININE 0.67 0.75 0.81  --     Estimated Creatinine Clearance: 102.7 mL/min (by C-G formula based on SCr of 0.81 mg/dL).   Medical History: Past Medical History:  Diagnosis Date   ADD (attention deficit disorder)    Anxiety 2007   Asthma as child   Asymptomatic gallstones    Atrial fibrillation (HCC)    Atrial flutter (HCC)    Depression    DJD (degenerative joint disease) of knee    Dysrhythmia    Chronic Atrial Fib- seeing Dr. DOROTHA [facility]   H/O peptic ulcer    past history with GI bleed- cauterization done throught scope   History of kidney stones    pt claims urology discovered a kidney stone last week 05/25/21   Hypercholesterolemia    Orthostatic hypotension    Pneumonia as child   Pneumothorax on left 05/20/2009   Prediabetes    none now   Pulmonary nodules    Sleep apnea    cpap set on 13   Thrombocytopenia (HCC)    pt denies   Thyromegaly    Varicose veins     Medications:  Medications Prior to Admission  Medication Sig Dispense Refill Last Dose/Taking    acetaminophen  (TYLENOL ) 650 MG CR tablet Take 1,300 mg by mouth every 8 (eight) hours.   07/28/2023 Evening   alfuzosin  (UROXATRAL ) 10 MG 24 hr tablet Take 10 mg by mouth daily with breakfast.   07/27/2023   amphetamine -dextroamphetamine  (ADDERALL) 5 MG tablet Take 5 mg by mouth daily as needed (for ADD).   Past Week   apixaban  (ELIQUIS ) 5 MG TABS tablet Take 5 mg by mouth 2 (two) times daily.   07/27/2023   atorvastatin  (LIPITOR) 20 MG tablet Take 20 mg by mouth at bedtime.   07/26/2023   cetirizine  (ZYRTEC ) 10 MG tablet Take 1 tablet (10 mg total) by mouth daily. 30 tablet 11 07/27/2023   Coenzyme Q10 (CO Q 10 PO) Take 1 capsule by mouth daily.   07/27/2023   desvenlafaxine (PRISTIQ) 50 MG 24 hr tablet Take 50 mg by mouth daily.   07/27/2023   fludrocortisone  (FLORINEF ) 0.1 MG tablet Take 0.1 mg by mouth daily.   07/27/2023   fluticasone  (FLONASE ) 50 MCG/ACT nasal spray Place 2 sprays into both nostrils daily. 16 g 6 07/27/2023   gabapentin  (NEURONTIN ) 300 MG capsule Take 2 capsules (600 mg  total) by mouth at bedtime. 180 capsule 0 07/26/2023   Glucosamine-Chondroitin (COSAMIN DS PO) Take 1,500 mg by mouth daily. Rotate with other Joint Health   07/27/2023   latanoprost  (XALATAN ) 0.005 % ophthalmic solution Place 1 drop into both eyes at bedtime.   07/27/2023   LORazepam  (ATIVAN ) 1 MG tablet Take 1 mg by mouth every 4 (four) hours as needed.   07/27/2023   Melatonin 10 MG TABS Take 1 tablet by mouth at bedtime.   07/26/2023   Misc Natural Products (OSTEO BI-FLEX ADV TRIPLE ST PO) Take 1 tablet by mouth daily.   07/27/2023   Multiple Vitamins-Minerals (ONE DAILY MULTIVITAMIN MEN) TABS Take 1 tablet by mouth 2 (two) times daily. CENTRUM SILVER   07/27/2023   pantoprazole  (PROTONIX ) 40 MG tablet Take 40 mg by mouth in the morning and at bedtime.   07/27/2023   Probiotic Product (PROBIOTIC PO) Take 1-2 capsules by mouth daily.   07/27/2023   sildenafil (REVATIO) 20 MG tablet Take 20 mg by mouth daily as needed.   Taking As Needed    sodium chloride  (OCEAN) 0.65 % SOLN nasal spray Place 1 spray into both nostrils as needed. 30 mL 5 07/26/2023   SUPER B COMPLEX/C PO Take 1 tablet by mouth daily.   07/27/2023   zolpidem  (AMBIEN ) 10 MG tablet Take 5-10 mg by mouth at bedtime.  0 07/26/2023    Assessment: 73 y.o. M presents with acute cholecystitis. Pt on Eliquis  pta for PAF. Last dose given 1/3. Transitioned to Heparin  which was stopped 1/8 AM for cholecystectomy. Held throughout post-operative day 0 due to bleeding loss during surgery. Pharmacy consulted to resume IV Heparin  this AM - no bolus.   CBC stable.  NO further bleeding noted.  Will check both aPTT and Heparin  level - likely can transition to Heparin  level monitoring soon as Eliquis  eliminated from system.   PM: aPTT 38 (subtherapeutic) on heparin  1300 units/hr. No issues with the heparin  infusion, no new bleeding reported per discussion with RN.  Goal of Therapy:  Heparin  level 0.3-0.7 units/ml aPTT 66-102 seconds Monitor platelets by anticoagulation protocol: Yes   Plan:  Increase heparin  at 1500 units/hr. Will f/u aPTT and heparin  level in 8 hours Monitor closely for any signs or symptoms of bleeding.   Rocky Slade, PharmD, BCPS Please refer to [facility] for [facility] Pharmacy numbers 08/02/2023,8:41 PM

## 2023-08-02 NOTE — Progress Notes (Signed)
  Progress Note   Patient: Francisco Padilla FMW:[id number] DOB: 1951-01-25 DOA: 07/28/2023     4 DOS: the patient was seen and examined on 08/02/2023   Brief hospital course: 73 y.o. 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 Cardiology is consulted for chronic AFib and anticoagulation recommendations. General surgery is currently recommending laparoscopic cholecystectomy.   Assessment and Plan: Sepsis due to acute cholecystitis: Hepatitis panel negative.  - Continue zosyn , WBC coming down, though still quite tender, and LFTs up from admission.   - Follow up blood cultures - Continue pain control as ordered and postop per surgery.  - Diet NPO preop, pt s/p lap chole 1/7    Chronic AFib: Afib on admission, remains rate controlled.   - Monitor telemetry.  - Holding eliquis , CHA2DS2-VASc score is 2. We've bridged with heparin , to stop this morning preop.  - Not on rate control agent at baseline.  - Echocardiogram showed preserved LVEF, severe AS for which cardiology recommends no further work up currently and suggests avoiding perioperative hypotension.    AKI: Improving Cr 1.51 > 0.67 which is baseline and CrCl >37ml/min, remains stable. FENa was low. - renal function normalized    Insomnia:  - No changes to home regimen   Peripheral neuropathy:  - Continue gabapentin    Abnormal hepatic appearance by CT: Hepatitis panel negative   Hypokalemia:  - Supplement again today with Mg, monitor.    Chronic hypotension:  - Continue florinef       Subjective: Without complaints  Physical Exam: Vitals:   08/02/23 1430 08/02/23 1500 08/02/23 1530 08/02/23 1600  BP: (!) 85/69 (!) 87/72 (!) 85/69 (!) 88/66  Pulse: 99 (!) 101 98 86  Resp: 12 13 11 18   Temp:     97.6 F (36.4 C)  TempSrc:    Oral  SpO2: 95% 96% 96% 96%  Weight:      Height:       General exam: Awake, laying in bed, in nad Respiratory system: Normal respiratory effort, no wheezing Cardiovascular system: regular rate, s1, s2 Gastrointestinal system: Soft, nondistended, positive BS Central nervous system: CN2-12 grossly intact, strength intact Extremities: Perfused, no clubbing Skin: Normal skin turgor, no notable skin lesions seen Psychiatry: Mood normal // no visual hallucinations   Data Reviewed:  Labs reviewed: na 133, K 4.8, Cr 0.81, WBC 15.1, 12.0  Family Communication: Pt in room, family not at bedside  Disposition: Status is: Inpatient Remains inpatient appropriate because: severity of illness  Planned Discharge Destination: Home    Author: Garnette Pelt, MD 08/02/2023 5:48 PM  For on call review [email or web address].

## 2023-08-03 ENCOUNTER — Other Ambulatory Visit: Payer: Self-pay

## 2023-08-03 ENCOUNTER — Inpatient Hospital Stay (HOSPITAL_COMMUNITY): Payer: Medicare Other

## 2023-08-03 ENCOUNTER — Encounter (HOSPITAL_COMMUNITY): Payer: Self-pay | Admitting: Internal Medicine

## 2023-08-03 DIAGNOSIS — Z9049 Acquired absence of other specified parts of digestive tract: Secondary | ICD-10-CM

## 2023-08-03 DIAGNOSIS — R918 Other nonspecific abnormal finding of lung field: Secondary | ICD-10-CM | POA: Insufficient documentation

## 2023-08-03 DIAGNOSIS — K9189 Other postprocedural complications and disorders of digestive system: Secondary | ICD-10-CM

## 2023-08-03 DIAGNOSIS — K810 Acute cholecystitis: Secondary | ICD-10-CM | POA: Diagnosis not present

## 2023-08-03 DIAGNOSIS — K839 Disease of biliary tract, unspecified: Secondary | ICD-10-CM | POA: Insufficient documentation

## 2023-08-03 DIAGNOSIS — K819 Cholecystitis, unspecified: Secondary | ICD-10-CM | POA: Diagnosis not present

## 2023-08-03 DIAGNOSIS — I4891 Unspecified atrial fibrillation: Secondary | ICD-10-CM | POA: Diagnosis not present

## 2023-08-03 DIAGNOSIS — R1030 Lower abdominal pain, unspecified: Secondary | ICD-10-CM | POA: Diagnosis not present

## 2023-08-03 DIAGNOSIS — E782 Mixed hyperlipidemia: Secondary | ICD-10-CM | POA: Diagnosis not present

## 2023-08-03 DIAGNOSIS — I350 Nonrheumatic aortic (valve) stenosis: Secondary | ICD-10-CM | POA: Diagnosis not present

## 2023-08-03 DIAGNOSIS — G4733 Obstructive sleep apnea (adult) (pediatric): Secondary | ICD-10-CM | POA: Diagnosis not present

## 2023-08-03 LAB — GLUCOSE, CAPILLARY
Glucose-Capillary: 159 mg/dL — ABNORMAL HIGH (ref 70–99)
Glucose-Capillary: 122 mg/dL — ABNORMAL HIGH (ref 70–99)
Glucose-Capillary: 132 mg/dL — ABNORMAL HIGH (ref 70–99)
Glucose-Capillary: 146 mg/dL — ABNORMAL HIGH (ref 70–99)
Glucose-Capillary: 202 mg/dL — ABNORMAL HIGH (ref 70–99)
Glucose-Capillary: 179 mg/dL — ABNORMAL HIGH (ref 70–99)

## 2023-08-03 LAB — HEPATIC FUNCTION PANEL
ALT: 35 U/L (ref 0–44)
AST: 29 U/L (ref 15–41)
Albumin: 2.4 g/dL — ABNORMAL LOW (ref 3.5–5.0)
Alkaline Phosphatase: 79 U/L (ref 38–126)
Bilirubin, Direct: 0.3 mg/dL — ABNORMAL HIGH (ref 0.0–0.2)
Indirect Bilirubin: 0.8 mg/dL (ref 0.3–0.9)
Total Bilirubin: 1.1 mg/dL (ref 0.0–1.2)
Total Protein: 6 g/dL — ABNORMAL LOW (ref 6.5–8.1)

## 2023-08-03 LAB — CBC
HCT: 39.8 % (ref 39.0–52.0)
Hemoglobin: 13.3 g/dL (ref 13.0–17.0)
MCH: 30.9 pg (ref 26.0–34.0)
MCHC: 33.4 g/dL (ref 30.0–36.0)
MCV: 92.6 fL (ref 80.0–100.0)
Platelets: 311 10*3/uL (ref 150–400)
RBC: 4.3 MIL/uL (ref 4.22–5.81)
RDW: 13 % (ref 11.5–15.5)
WBC: 25.1 10*3/uL — ABNORMAL HIGH (ref 4.0–10.5)
nRBC: 0 % (ref 0.0–0.2)

## 2023-08-03 LAB — CULTURE, BLOOD (ROUTINE X 2)
Culture: NO GROWTH
Culture: NO GROWTH
Special Requests: ADEQUATE

## 2023-08-03 LAB — BASIC METABOLIC PANEL
Anion gap: 8 (ref 5–15)
BUN: 22 mg/dL (ref 8–23)
CO2: 25 mmol/L (ref 22–32)
Calcium: 8.9 mg/dL (ref 8.9–10.3)
Chloride: 98 mmol/L (ref 98–111)
Creatinine, Ser: 0.89 mg/dL (ref 0.61–1.24)
GFR, Estimated: >60 mL/min (ref >60)
Glucose, Bld: 148 mg/dL — ABNORMAL HIGH (ref 70–99)
Potassium: 3.9 mmol/L (ref 3.5–5.1)
Sodium: 131 mmol/L — ABNORMAL LOW (ref 135–145)

## 2023-08-03 LAB — SURGICAL PATHOLOGY

## 2023-08-03 LAB — HEPARIN LEVEL (UNFRACTIONATED): Heparin Unfractionated: 0.11 [IU]/mL — ABNORMAL LOW (ref 0.30–0.70)

## 2023-08-03 LAB — APTT: aPTT: 39 s — ABNORMAL HIGH (ref 24–36)

## 2023-08-03 LAB — LACTIC ACID, PLASMA: Lactic Acid, Venous: 1.4 mmol/L (ref 0.5–1.9)

## 2023-08-03 LAB — MAGNESIUM: Magnesium: 2 mg/dL (ref 1.7–2.4)

## 2023-08-03 MED ORDER — LACTATED RINGERS IV BOLUS
500.0000 mL | Freq: Once | INTRAVENOUS | Status: AC
  Administered 2023-08-03: 500 mL via INTRAVENOUS

## 2023-08-03 MED ORDER — INSULIN ASPART 100 UNIT/ML IJ SOLN
2.0000 [IU] | INTRAMUSCULAR | Status: DC
  Administered 2023-08-03: 4 [IU] via SUBCUTANEOUS
  Administered 2023-08-03: 6 [IU] via SUBCUTANEOUS
  Administered 2023-08-04 (×2): 2 [IU] via SUBCUTANEOUS
  Administered 2023-08-04: 6 [IU] via SUBCUTANEOUS
  Administered 2023-08-04: 2 [IU] via SUBCUTANEOUS
  Administered 2023-08-04 – 2023-08-05 (×2): 6 [IU] via SUBCUTANEOUS
  Administered 2023-08-05: 4 [IU] via SUBCUTANEOUS
  Administered 2023-08-05: 2 [IU] via SUBCUTANEOUS

## 2023-08-03 MED ORDER — DOCUSATE SODIUM 100 MG PO CAPS
100.0000 mg | ORAL_CAPSULE | Freq: Two times a day (BID) | ORAL | Status: DC
  Administered 2023-08-03 – 2023-08-17 (×20): 100 mg via ORAL
  Filled 2023-08-03 (×27): qty 1

## 2023-08-03 MED ORDER — ADULT MULTIVITAMIN W/MINERALS CH
1.0000 | ORAL_TABLET | Freq: Two times a day (BID) | ORAL | Status: DC
  Administered 2023-08-03 – 2023-08-25 (×45): 1 via ORAL
  Filled 2023-08-03 (×44): qty 1

## 2023-08-03 MED ORDER — ENSURE ENLIVE PO LIQD
237.0000 mL | Freq: Two times a day (BID) | ORAL | Status: DC
  Administered 2023-08-03 – 2023-08-25 (×33): 237 mL via ORAL

## 2023-08-03 MED ORDER — CALCIUM CARBONATE ANTACID 500 MG PO CHEW
200.0000 mg | CHEWABLE_TABLET | Freq: Three times a day (TID) | ORAL | Status: DC
  Administered 2023-08-03 – 2023-08-15 (×33): 200 mg via ORAL
  Filled 2023-08-03 (×34): qty 1

## 2023-08-03 MED ORDER — POLYETHYLENE GLYCOL 3350 17 G PO PACK
17.0000 g | PACK | Freq: Every day | ORAL | Status: DC
  Administered 2023-08-03 – 2023-08-25 (×12): 17 g via ORAL
  Filled 2023-08-03 (×21): qty 1

## 2023-08-03 MED ORDER — HYDROCORTISONE SOD SUC (PF) 100 MG IJ SOLR
100.0000 mg | Freq: Three times a day (TID) | INTRAMUSCULAR | Status: DC
  Administered 2023-08-03 – 2023-08-06 (×10): 100 mg via INTRAVENOUS
  Filled 2023-08-03 (×11): qty 2

## 2023-08-03 NOTE — Consult Note (Addendum)
 Consultation  Referring Provider:  [facility]  Primary Care Physician:  Sherlynn Madden, MD Primary Gastroenterologist:  Margarete now transferring to Dr. San Reason for Consultation:    Postop bile leak  LOS: 5 days          HPI:   Francisco Padilla is a 73 y.o. male with past medical history significant for OSA on CPAP, depression, anxiety, peripheral neuropathy, hyperlipidemia, chronic hypotension, paroxysmal atrial fibrillation (on Eliquis ), gerd, history of bariatric surgery (RYGB), BPH and peripheral vascular disease presents for evaluation of postop bile leak  Patient initially presented to ED 1/5 with generalized weakness/fatigue x 1 month with associated nausea, abdominal pain, poor appetite, and diarrhea in addition to unintentional weight loss of 10 pounds due to RUQ pain imaging was pursued.  CT abdomen pelvis showed cholelithiasis and gallbladder wall thickening with surrounding inflammation concerning for acute cholecystitis.  Periportal edema.  Prior gastric bypass (RYGB).  Surgery was consulted.  Due to Eliquis  patient had to wait 48-hour washout and cardiology was consulted for preop evaluation.  Echo 1/7 showed LVEF 60 to 65% with severe aortic stenosis.  Patient underwent subtotal cholecystectomy for gangrenous cholecystitis 1/8.  While in OR patient had tachycardia and given esmolol  and later became hypotensive and ultimately taken to ICU postop. Patient is on florinef  at home. BP currently stable. Baseline per RN has been soft pressures with SBP around 90  GI was consulted for concern for postop bile leak with request for ERCP.  Drain output greater than 400 mL with bile.  Labs notable for improving leukocytosis while on Zosyn .SABRA  AST 32, ALT 43, alk phos 69.  Total bilirubin 1.5  Patient reports lower abdominal pain and gas pain.  He has been given MiraLAX  and senna.  He is currently lethargic at the time of my evaluation as he recently received a dose of  Dilaudid .  Though he is oriented.  Denies nausea/vomiting.  Past Medical History:  Diagnosis Date  . ADD (attention deficit disorder)   . Anxiety 2007  . Asthma as child  . Asymptomatic gallstones   . Atrial fibrillation (HCC)   . Atrial flutter (HCC)   . Depression   . DJD (degenerative joint disease) of knee   . Dysrhythmia    Chronic Atrial Fib- seeing Dr. [facility]OROT[facility]  . H/O peptic ulcer    past history with GI bleed- cauterization done throught scope  . History of kidney stones    pt claims urology discovered a kidney stone last week 05/25/21  . Hypercholesterolemia   . Orthostatic hypotension   . Pneumonia as child  . Pneumothorax on left 05/20/2009  . Prediabetes    none now  . Pulmonary nodules   . Sleep apnea    cpap set on 13  . Thrombocytopenia (HCC)    pt denies  . Thyromegaly   . Varicose veins     Surgical History:  He  has a past surgical history that includes Gastric Roux-En-Y; Tonsillectomy; Knee arthroscopy; Esophagogastroduodenoscopy (egd) with propofol  (N/A, 01/05/2015); Ulnar nerve transposition (Right, 15 yrs ago); Esophagogastroduodenoscopy (N/A, 04/18/2018); biopsy (04/18/2018); Total knee arthroplasty (Left, 06/03/2021); Extracorporeal shock wave lithotripsy (Left, 08/11/2021); Nasal septoplasty w/ turbinoplasty (Bilateral, 11/13/2022); and Cholecystectomy (N/A, 08/01/2023). Family History:  His family history includes Heart attack in his father; Heart disease in his father and mother; Heart failure in his mother. Social History:   reports that he has never smoked. He has never used smokeless tobacco. He reports  that he does not drink alcohol and does not use drugs.  Prior to Admission medications   Medication Sig Start Date End Date Taking? Authorizing Provider  acetaminophen  (TYLENOL ) 650 MG CR tablet Take 1,300 mg by mouth every 8 (eight) hours.   Yes [provider]  alfuzosin  (UROXATRAL ) 10 MG 24 hr tablet Take 10 mg by mouth  daily with breakfast.   Yes [provider]  amphetamine -dextroamphetamine  (ADDERALL) 5 MG tablet Take 5 mg by mouth daily as needed (for ADD).   Yes [provider]  apixaban  (ELIQUIS ) 5 MG TABS tablet Take 5 mg by mouth 2 (two) times daily.   Yes [provider]  atorvastatin  (LIPITOR) 20 MG tablet Take 20 mg by mouth at bedtime. 04/03/21  Yes [provider]  cetirizine  (ZYRTEC ) 10 MG tablet Take 1 tablet (10 mg total) by mouth daily. 06/14/23  Yes Soldatova, Liuba, MD  Coenzyme Q10 (CO Q 10 PO) Take 1 capsule by mouth daily.   Yes [provider]  desvenlafaxine (PRISTIQ) 50 MG 24 hr tablet Take 50 mg by mouth daily. 02/05/23  Yes [provider]  fludrocortisone  (FLORINEF ) 0.1 MG tablet Take 0.1 mg by mouth daily. 03/07/19  Yes [provider]  fluticasone  (FLONASE ) 50 MCG/ACT nasal spray Place 2 sprays into both nostrils daily. 06/14/23  Yes Soldatova, Liuba, MD  gabapentin  (NEURONTIN ) 300 MG capsule Take 2 capsules (600 mg total) by mouth at bedtime. 07/10/23 10/08/23 Yes Gershon Donnice SAUNDERS, DPM  Glucosamine-Chondroitin (COSAMIN DS PO) Take 1,500 mg by mouth daily. Rotate with other Joint Health   Yes [provider]  latanoprost  (XALATAN ) 0.005 % ophthalmic solution Place 1 drop into both eyes at bedtime. 04/23/21  Yes [provider]  LORazepam  (ATIVAN ) 1 MG tablet Take 1 mg by mouth every 4 (four) hours as needed.   Yes [provider]  Melatonin 10 MG TABS Take 1 tablet by mouth at bedtime.   Yes [provider]  Misc Natural Products (OSTEO BI-FLEX ADV TRIPLE ST PO) Take 1 tablet by mouth daily.   Yes [provider]  Multiple Vitamins-Minerals (ONE DAILY MULTIVITAMIN MEN) TABS Take 1 tablet by mouth 2 (two) times daily. CENTRUM SILVER 04/13/06  Yes [provider]  pantoprazole  (PROTONIX ) 40 MG tablet Take 40 mg by mouth in the morning and at bedtime. 10/24/09  Yes [provider]  Probiotic Product (PROBIOTIC PO) Take 1-2 capsules by mouth daily.   Yes [provider]  sildenafil (REVATIO) 20 MG tablet Take 20 mg by mouth daily as needed.   Yes [provider]  sodium chloride  (OCEAN) 0.65 % SOLN nasal spray Place 1 spray into both nostrils as needed. 06/14/23  Yes Soldatova, Liuba, MD  SUPER B COMPLEX/C PO Take 1 tablet by mouth daily.   Yes [provider]  zolpidem  (AMBIEN ) 10 MG tablet Take 5-10 mg by mouth at bedtime. 08/12/14  Yes [provider]    Current Facility-Administered Medications  Medication Dose Route Frequency Provider Last Rate Last Admin  . acetaminophen  (TYLENOL ) tablet 650 mg  650 mg Oral Q4H Payne, Skyler Carel D, PA-C   650 mg at 08/03/23 0848   Or  . acetaminophen  (TYLENOL ) suppository 650 mg  650 mg Rectal Q4H Payne, Marsena Taff D, PA-C      . acidophilus (RISAQUAD) capsule 1 capsule  1 capsule Oral Daily Bryn Bernardino NOVAK, MD   1 capsule at 08/03/23 0846  . alfuzosin  (UROXATRAL ) 24 hr tablet 10  mg  10 mg Oral Q breakfast Bryn Bernardino NOVAK, MD   10 mg at 08/03/23 0846  . Chlorhexidine  Gluconate Cloth 2 % PADS 6 each  6 each Topical Daily Bryn Bernardino NOVAK, MD   6 each at 08/03/23 0513  . docusate sodium  (COLACE) capsule 100 mg  100 mg Oral BID Polly Cordella LABOR, MD   100 mg at 08/03/23 0902  . fludrocortisone  (FLORINEF ) tablet 0.1 mg  0.1 mg Oral Daily Sundil, Subrina, MD   0.1 mg at 08/03/23 0846  . gabapentin  (NEURONTIN ) capsule 600 mg  600 mg Oral QHS Bryn Bernardino B, MD   600 mg at 08/02/23 2259  . heparin  ADULT infusion 100 units/mL (25000 units/250mL)  1,500 Units/hr Intravenous Continuous Billy Rocky SAUNDERS, RPH 15 mL/hr at 08/03/23 0600 1,500 Units/hr at 08/03/23 0600  . HYDROmorphone  (DILAUDID ) injection 0.5-1 mg  0.5-1 mg Intravenous Q4H PRN Payne, Ahaana Rochette D, PA-C   1 mg at 08/03/23 9097  . latanoprost  (XALATAN ) 0.005 % ophthalmic solution 1 drop  1 drop Both Eyes QHS Bryn Bernardino B, MD   1 drop at 08/02/23 2259  .  LORazepam  (ATIVAN ) tablet 1 mg  1 mg Oral Q4H PRN Sundil, Subrina, MD   1 mg at 08/03/23 0447  . methocarbamol  (ROBAXIN ) tablet 750 mg  750 mg Oral BID Payne, Kaizen Ibsen D, PA-C   750 mg at 08/03/23 0848  . multivitamin (PROSIGHT) tablet 1 tablet  1 tablet Oral Daily Grunz, Ryan B, MD   1 tablet at 08/03/23 0846  . pantoprazole  (PROTONIX ) EC tablet 40 mg  40 mg Oral Daily Sundil, Subrina, MD   40 mg at 08/02/23 1030  . piperacillin -tazobactam (ZOSYN ) IVPB 3.375 g  3.375 g Intravenous Q8H Polly Cordella LABOR, MD 12.5 mL/hr at 08/03/23 0844 3.375 g at 08/03/23 0844  . polyethylene glycol (MIRALAX  / GLYCOLAX ) packet 17 g  17 g Oral Daily Polly Cordella LABOR, MD   17 g at 08/03/23 0902  . senna-docusate (Senokot-S) tablet 1 tablet  1 tablet Oral QHS PRN Sundil, Subrina, MD   1 tablet at 08/03/23 0846  . simethicone  (MYLICON) chewable tablet 80 mg  80 mg Oral QID PRN Hall, Carole N, DO   80 mg at 08/02/23 2308  . sodium chloride  flush (NS) 0.9 % injection 3 mL  3 mL Intravenous Q12H Sundil, Subrina, MD   3 mL at 08/03/23 0902  . venlafaxine  XR (EFFEXOR -XR) 24 hr capsule 37.5 mg  37.5 mg Oral Q breakfast Sundil, Subrina, MD   37.5 mg at 08/03/23 0846  . zolpidem  (AMBIEN ) tablet 5 mg  5 mg Oral QHS PRN Sundil, Subrina, MD   5 mg at 07/29/23 2245    Allergies as of 07/28/2023 - Review Complete 07/28/2023  Allergen Reaction Noted  . Aspirin  05/31/2009  . Nsaids  05/31/2009  . Oxycodone  hcl  05/31/2009    Review of Systems  Constitutional:  Negative for chills, fever and weight loss.  HENT:  Negative for hearing loss and tinnitus.   Eyes:  Negative for blurred vision and double vision.  Respiratory:  Negative for cough and hemoptysis.   Cardiovascular:  Negative for chest pain and palpitations.  Gastrointestinal:  Positive for abdominal pain and constipation. Negative for blood in stool, diarrhea, heartburn, melena, nausea and vomiting.  Genitourinary:  Negative for dysuria and urgency.  Musculoskeletal:   Negative for myalgias and neck pain.  Skin:  Negative for itching and rash.  Neurological:  Negative for seizures and loss of  consciousness.  Psychiatric/Behavioral:  Negative for depression and suicidal ideas.        Physical Exam:  Vital signs in last 24 hours: Temp:  [97.2 F (36.2 C)-97.8 F (36.6 C)] 97.2 F (36.2 C) (01/10 0335) Pulse Rate:  [83-127] 98 (01/10 0900) Resp:  [7-29] 23 (01/10 0830) BP: (79-114)/(58-82) 97/82 (01/10 0900) SpO2:  [89 %-99 %] 98 % (01/10 0900) Weight:  [87.4 kg] 87.4 kg (01/10 0500) Last BM Date : 07/31/23 Last BM recorded by nurses in past 5 days No data recorded  Physical Exam Constitutional:      Appearance: He is ill-appearing.     Comments: Lethargic  HENT:     Nose: Nose normal. No congestion.     Mouth/Throat:     Mouth: Mucous membranes are dry.  Eyes:     General: No scleral icterus.    Extraocular Movements: Extraocular movements intact.  Cardiovascular:     Rate and Rhythm: Normal rate and regular rhythm.  Pulmonary:     Effort: Pulmonary effort is normal. No respiratory distress.  Abdominal:     General: Bowel sounds are normal.     Palpations: Abdomen is soft.     Tenderness: There is abdominal tenderness (lower abdomen).     Comments: Drain with 100cc dark bilious output  Musculoskeletal:        General: No swelling. Normal range of motion.     Cervical back: Normal range of motion and neck supple.  Skin:    General: Skin is warm and dry.     Coloration: Skin is pale.  Neurological:     General: No focal deficit present.     Mental Status: He is oriented to person, place, and time.     Comments: Lethargic  Psychiatric:        Mood and Affect: Mood normal.        Behavior: Behavior normal.        Thought Content: Thought content normal.        Judgment: Judgment normal.      LAB RESULTS: Recent Labs    08/01/23 2009 08/02/23 0221 08/02/23 1955  WBC 12.4* 15.1* 18.3*  HGB 11.6* 12.0* 12.5*  HCT 34.9*  36.3* 37.3*  PLT 165 185 216   BMET Recent Labs    08/01/23 0303 08/01/23 2009 08/02/23 0221  NA 132* 135 133*  K 3.4* 4.0 4.8  CL 99 104 101  CO2 23 20* 19*  GLUCOSE 107* 146* 154*  BUN 18 19 20   CREATININE 0.67 0.75 0.81  CALCIUM  8.8* 8.8* 8.8*   LFT Recent Labs    08/02/23 0221  PROT 5.8*  ALBUMIN  2.5*  AST 32  ALT 43  ALKPHOS 69  BILITOT 1.5*   PT/INR Recent Labs    08/01/23 2009  LABPROT 16.7*  INR 1.3*    STUDIES: No results found.    Impression/Plan   Postop bile leak Acute gangrenous cholecystitis s/p subtotal cholecystectomy 1/8 with surgery complicated by tachycardia and hypotension requiring pressors Sepsis secondary to cholecystitis History of gastric bypass with RYGB WBC improving to 12.4 on Zosyn  > bilious output LFTs normal -- discussed with our ERCP MD (Dr. Abran). Due to his history of Roux-en-Y anatomy, patient is not a an ERCP candidate at this hospital.  Will need to be transferred to tertiary center for further evaluation and possible consideration of EDGE (Endoscopic ultrasound Directed transGastric ERCP).  -- continue supportive care -- continue to monitor output  Lower abdominal  pain Likely secondary to constipation -- continue to optimize bowel regimen with miralax  and senna -- dulcolax suppository prn  A-fib on Eliquis  Last dose Eliquis  1/5. Bridged to heparin .  Acute on chronic hypotension - on florinef  at home. Esmolol  given for tachycardia in OR and then become hypotensive requiring neo. BP has been stable.  Thank you for your kind consultation, we will continue to follow.   Bayley CHRISTELLA Blower  08/03/2023, 9:22 AM    GI ATTENDING  History, laboratories, x-rays, operative report all personally reviewed.  Patient personally seen and examined.  Agree with comprehensive consultation note as outlined above.  The patient has a postoperative bile leak after subtotal cholecystectomy for gangrenous cholecystitis.   Clinically stable with significant bile output as recorded.  The patient is an appropriate candidate for ERCP with biliary stenting.  His case is complicated by the fact that he has had prior Roux-en-Y gastric bypass surgery.  This altered anatomy precludes standard ERCP technique.  As mentioned above, he would be a candidate for EDGE (endoscopic ultrasound directed transgastric ERCP) with an advanced therapeutic endoscopist at one of the regional tertiary centers.  As a [employer], you may want to reach out to [name].  Otherwise, ongoing standard postoperative management per surgery.  Will sign off at this time.  Thank you.  Norleen SAILOR. Abran Raddle., M.D. [facility] Division of Gastroenterology.

## 2023-08-03 NOTE — Progress Notes (Addendum)
  Progress Note   Patient: Francisco Padilla FMW:[id number] DOB: 1950-09-18 DOA: 07/28/2023     5 DOS: the patient was seen and examined on 08/03/2023   Brief hospital course: 73 y.o. 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 Cardiology is consulted for chronic AFib and anticoagulation recommendations. General surgery is currently recommending laparoscopic cholecystectomy.   Assessment and Plan: Sepsis due to acute cholecystitis: Hepatitis panel negative.  - Continue zosyn , WBC coming down, though still quite tender, and LFTs up from admission.   - Follow up blood cultures - General surgery following   Chronic AFib: Afib on admission, tachycardic - Monitor telemetry.  - Holding eliquis , CHA2DS2-VASc score is 2. We've bridged with heparin , to stop this morning preop.  - Not on rate control agent at baseline.  - Echocardiogram showed preserved LVEF, severe AS, cardiology following  Shock -BP noted to be soft this AM, previously tolerated gentle fluid -Appreciate assistance by Cardiology and Pulmonary Critical Care -plan for likely pressor support at this time   AKI: Improving Cr 1.51 > 0.67 which is baseline and CrCl >55ml/min, remains stable. FENa was low. - renal function normalized    Insomnia:  - No changes to home regimen   Peripheral neuropathy:  - Continue gabapentin    Abnormal hepatic appearance by CT: Hepatitis panel negative   Hypokalemia:  - replaced   Chronic hypotension:  - Continue florinef       Subjective: No complaints when seen this AM  Physical Exam: Vitals:   08/03/23 1330 08/03/23 1345 08/03/23 1427 08/03/23 1430  BP: (!) 85/62 (!) 72/61 104/82 (!) 78/60  Pulse: (!) 105 (!) 118 (!) 101   Resp: 12 14 12 12   Temp:       TempSrc:      SpO2: 95% 98% 98%   Weight:      Height:       General exam: Conversant, in no acute distress Respiratory system: normal chest rise, clear, no audible wheezing Cardiovascular system: regular rhythm, s1-s2 Gastrointestinal system: Nondistended, nontender, pos BS Central nervous system: No seizures, no tremors Extremities: No cyanosis, no joint deformities Skin: No rashes, no pallor Psychiatry: Affect normal // no auditory hallucinations   Data Reviewed:  Labs reviewed: Na 131, K 3.9, Cr 0.89, WBC 25.1, hgb 13.3, Plts 311  Family Communication: Pt in room, family not at bedside  Disposition: Status is: Inpatient Remains inpatient appropriate because: severity of illness  Planned Discharge Destination: Home    Author: Garnette Pelt, MD 08/03/2023 3:16 PM  For on call review [email or web address].

## 2023-08-03 NOTE — Progress Notes (Signed)
 Patient: Francisco Padilla FMW:[id number] DOB: 1951-03-09 DOA: 07/28/2023     5 DOS: the patient was seen and examined on 08/03/2023   Synopsis 73 y.o. 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 Cardiology is consulted for chronic AFib and anticoagulation recommendations. General surgery is currently recommending laparoscopic cholecystectomy.     CC Follow up sepsis  HPI I was called to evaluate patient with LOW BP and high HR Afib with RVR 150, SBP 70's Patient  given 1L LR bolus, stress dose steroids started Patient alert and awake, in the chair Gangrenous cholecystitis s/p lap subtotal cholecystectomy 1/8  Cardiology contacted   Physical Exam: Vitals:   08/03/23 1245 08/03/23 1300 08/03/23 1315 08/03/23 1330  BP: (!) 69/54 (!) 74/55 (!) 80/65 (!) 85/62  Pulse:  (!) 146 (!) 150 (!) 105  Resp: 13 20 (!) 23 12  Temp:      TempSrc:      SpO2:  (!) 84% (!) 88% 95%  Weight:      Height:       CBC    Component Value Date/Time   WBC 25.1 (H) 08/03/2023 0845   RBC 4.30 08/03/2023 0845   HGB 13.3 08/03/2023 0845   HGB 13.1 02/28/2023 1537   HCT 39.8 08/03/2023 0845   HCT 40.8 02/28/2023 1537   PLT 311 08/03/2023 0845   PLT 216 02/28/2023 1537   MCV 92.6 08/03/2023 0845   MCV 86 02/28/2023 1537   MCH 30.9 08/03/2023 0845   MCHC 33.4 08/03/2023 0845   RDW 13.0 08/03/2023 0845   RDW 17.7 (H) 02/28/2023 1537   LYMPHSABS 1.2 02/28/2023 1537   MONOABS 0.4 12/07/2022 1548   EOSABS 60 07/19/2023 1128   EOSABS 0.1 02/28/2023 1537   BASOSABS 8 07/19/2023 1128   BASOSABS 0.0 02/28/2023 1537      Latest Ref Rng & Units 08/03/2023    8:45 AM 08/02/2023    2:21 AM 08/01/2023    8:09 PM  BMP  Glucose 70 - 99 mg/dL 851  845  853   BUN 8 - 23 mg/dL  22  20  19    Creatinine 0.61 - 1.24 mg/dL 9.10  9.18  9.24   Sodium 135 - 145 mmol/L 131  133  135   Potassium 3.5 - 5.1 mmol/L 3.9  4.8  4.0   Chloride 98 - 111 mmol/L 98  101  104   CO2 22 - 32 mmol/L 25  19  20    Calcium  8.9 - 10.3 mg/dL 8.9  8.8  8.8        Review of Systems: Gen:  Denies  fever, sweats, chills weight loss  HEENT: Denies blurred vision, double vision, ear pain, eye pain, hearing loss, nose bleeds, sore throat Cardiac:  No dizziness, chest pain or heaviness, chest tightness,edema, No JVD Resp:   No cough, -sputum production, -shortness of breath,-wheezing, -hemoptysis,  Other:  All other systems negative   Physical Examination:   General Appearance: No distress  EYES PERRLA, EOM intact.   NECK Supple, No JVD Pulmonary: normal breath sounds, No wheezing.  CardiovascularNormal S1,S2.  No m/r/g.   Abdomen: Benign, Soft, non-tender. Neurology UE/LE 5/5 strength, no focal deficits Ext pulses intact, cap refill intact ALL OTHER ROS  ARE NEGATIVE   Assessment and Plan: Admitted for Septic Shock  due to acute cholecystitis associated with severe AS - Continue zosyn , WBC coming down, though still quite tender, and LFTs up from admission.   - Follow up blood cultures - Continue pain control as ordered and postop per surgery.  Started stress dose steroids    CARDIAC SEVERE AS WITH AFIB WITH RVR Re-consulted cardiology Plan for PICC line, pressors and AMIO    ENDO - ICU hypoglycemic\Hyperglycemia protocol -check FSBS per protocol   GI GI PROPHYLAXIS as indicated  NUTRITIONAL STATUS DIET-->as tolerated Constipation protocol as indicated   ELECTROLYTES -follow labs as needed -replace as needed -pharmacy consultation and following    DVT/GI PRX  assessed I Assessed the need for Labs I Assessed the need for Foley I Assessed the need for Central Venous Line Family Discussion when available I Assessed the need for Mobilization I made an  Assessment of medications to be adjusted accordingly Safety Risk assessment completed  CASE DISCUSSED IN MULTIDISCIPLINARY ROUNDS WITH ICU TEAM     Critical Care Time devoted to patient care services described in this note is 55 minutes.    Nickolas Alm Cellar, M.D.  [facility]  Medical Director [facility] Medical Director [facility] Cardio-Pulmonary Department

## 2023-08-03 NOTE — Plan of Care (Signed)

## 2023-08-03 NOTE — Progress Notes (Addendum)
 PHARMACY - ANTICOAGULATION CONSULT NOTE  Pharmacy Consult for Heparin  Indication: atrial fibrillation  Allergies  Allergen Reactions   Aspirin     Avoid due to bariatric surgery   Bupropion Other (See Comments)    Passed out   Nsaids     Avoid due to bariatric surgery   Oxycodone  Hcl     Hallucinations, agitation     Patient Measurements: Height: 6' 4 (193 cm) Weight: 87.4 kg (192 lb 10.9 oz) IBW/kg (Calculated) : 86.8 Heparin  Dosing Weight: 93 kg  Vital Signs: Temp: 97.2 F (36.2 C) (01/10 0335) Temp Source: Axillary (01/10 0335) BP: 97/82 (01/10 0900) Pulse Rate: 98 (01/10 0900)  Labs: Recent Labs    08/01/23 0303 08/01/23 2009 08/02/23 0221 08/02/23 1955 08/03/23 0845  HGB 11.4* 11.6* 12.0* 12.5* 13.3  HCT 33.9* 34.9* 36.3* 37.3* 39.8  PLT 167 165 185 216 311  APTT 50* 40*  --  38* 39*  LABPROT  --  16.7*  --   --   --   INR  --  1.3*  --   --   --   HEPARINUNFRC 0.29*  --   --  0.12* 0.11*  CREATININE 0.67 0.75 0.81  --   --     Estimated Creatinine Clearance: 102.7 mL/min (by C-G formula based on SCr of 0.81 mg/dL).   Medical History: Past Medical History:  Diagnosis Date   ADD (attention deficit disorder)    Anxiety 2007   Asthma as child   Asymptomatic gallstones    Atrial fibrillation (HCC)    Atrial flutter (HCC)    Depression    DJD (degenerative joint disease) of knee    Dysrhythmia    Chronic Atrial Fib- seeing Dr. DOROTHA [facility]   H/O peptic ulcer    past history with GI bleed- cauterization done throught scope   History of kidney stones    pt claims urology discovered a kidney stone last week 05/25/21   Hypercholesterolemia    Orthostatic hypotension    Pneumonia as child   Pneumothorax on left 05/20/2009   Prediabetes    none now   Pulmonary nodules    Sleep apnea    cpap set on 13   Thrombocytopenia (HCC)    pt denies   Thyromegaly    Varicose veins     Medications:  Medications Prior to Admission   Medication Sig Dispense Refill Last Dose/Taking   acetaminophen  (TYLENOL ) 650 MG CR tablet Take 1,300 mg by mouth every 8 (eight) hours.   07/28/2023 Evening   alfuzosin  (UROXATRAL ) 10 MG 24 hr tablet Take 10 mg by mouth daily with breakfast.   07/27/2023   amphetamine -dextroamphetamine  (ADDERALL) 5 MG tablet Take 5 mg by mouth daily as needed (for ADD).   Past Week   apixaban  (ELIQUIS ) 5 MG TABS tablet Take 5 mg by mouth 2 (two) times daily.   07/27/2023   atorvastatin  (LIPITOR) 20 MG tablet Take 20 mg by mouth at bedtime.   07/26/2023   cetirizine  (ZYRTEC ) 10 MG tablet Take 1 tablet (10 mg total) by mouth daily. 30 tablet 11 07/27/2023   Coenzyme Q10 (CO Q 10 PO) Take 1 capsule by mouth daily.   07/27/2023   desvenlafaxine (PRISTIQ) 50 MG 24 hr tablet Take 50 mg by mouth daily.   07/27/2023   fludrocortisone  (FLORINEF ) 0.1 MG tablet Take 0.1 mg by mouth daily.   07/27/2023   fluticasone  (FLONASE ) 50 MCG/ACT nasal spray Place 2 sprays into both nostrils daily.  16 g 6 07/27/2023   gabapentin  (NEURONTIN ) 300 MG capsule Take 2 capsules (600 mg total) by mouth at bedtime. 180 capsule 0 07/26/2023   Glucosamine-Chondroitin (COSAMIN DS PO) Take 1,500 mg by mouth daily. Rotate with other Joint Health   07/27/2023   latanoprost  (XALATAN ) 0.005 % ophthalmic solution Place 1 drop into both eyes at bedtime.   07/27/2023   LORazepam  (ATIVAN ) 1 MG tablet Take 1 mg by mouth every 4 (four) hours as needed.   07/27/2023   Melatonin 10 MG TABS Take 1 tablet by mouth at bedtime.   07/26/2023   Misc Natural Products (OSTEO BI-FLEX ADV TRIPLE ST PO) Take 1 tablet by mouth daily.   07/27/2023   Multiple Vitamins-Minerals (ONE DAILY MULTIVITAMIN MEN) TABS Take 1 tablet by mouth 2 (two) times daily. CENTRUM SILVER   07/27/2023   pantoprazole  (PROTONIX ) 40 MG tablet Take 40 mg by mouth in the morning and at bedtime.   07/27/2023   Probiotic Product (PROBIOTIC PO) Take 1-2 capsules by mouth daily.   07/27/2023   sildenafil (REVATIO) 20 MG tablet Take 20  mg by mouth daily as needed.   Taking As Needed   sodium chloride  (OCEAN) 0.65 % SOLN nasal spray Place 1 spray into both nostrils as needed. 30 mL 5 07/26/2023   SUPER B COMPLEX/C PO Take 1 tablet by mouth daily.   07/27/2023   zolpidem  (AMBIEN ) 10 MG tablet Take 5-10 mg by mouth at bedtime.  0 07/26/2023    Assessment: 73 y.o. M presents with acute cholecystitis. Pt on Eliquis  pta for PAF. Last dose given 1/3. Transitioned to Heparin  which was stopped 1/8 AM for cholecystectomy. Held throughout post-operative day 0 due to bleeding loss during surgery. Pharmacy consulted to resume IV Heparin  this AM - no bolus.   CBC stable. NO further bleeding noted.  aPTT 39 (subtherapeutic) and heparin  level 0.11 (sub-therapeutic) on heparin  1300 units/hr.  Levels appear to be correlating - will use heparin  level going forward. No issues with the heparin  infusion, no new bleeding reported per discussion with RN. JP drainage less red and reduced.   Goal of Therapy:  Monitor platelets by anticoagulation protocol: Yes   Plan:  Increase heparin  at 1700 units/hr. Follow-up heparin  level in 8 hours Monitor closely for any signs or symptoms of bleeding.   Harlene Boga, PharmD, BCPS, BCCCP Please refer to [facility] for [facility] Pharmacy numbers 08/03/2023,9:59 AM

## 2023-08-03 NOTE — Progress Notes (Signed)
 Initial Nutrition Assessment  DOCUMENTATION CODES:   Severe malnutrition in context of chronic illness  INTERVENTION:  Continue regular diet as ordered [employer] Essentials TID with meals, each packet mixed with 8 ounces of 2% milk provides 13 grams of protein and 260 calories. Ensure Plus High Protein po BID between meals, each supplement provides 350 kcal and 20 grams of protein. 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  GOAL:   Patient will meet greater than or equal to 90% of their needs  MONITOR:   PO intake, Supplement acceptance, Labs, Weight trends  REASON FOR ASSESSMENT:   Consult Assessment of nutrition requirement/status (hx roux-en-y)  ASSESSMENT:   Pt admitted with generalized weakness/fatigue x1 month, poor oral intake and R abdominal pain found to have acute cholecystitis. 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   1/5: admitted 1/8: s/p lap chole   Surgery continues to follow. Reports high risk for ileus.  Now with concern for post op bile leak. Per GI, d/t history of roux-en-y anatomy, patient is not a candidate for ERCP, may need transfer to tertiary hospital for further evaluation of needed.   Attempted to speak with pt at bedside. Although noted to be oriented x4, pt very easily distracted and not able to stay on topic, continuing to recount stories of his late husband. Unable to elicit detailed nutrition related history.   Meal completions: 1/9: 20% lunch  Observed breakfast tray on bedside table untouched.   Admit weight: 90.4 kg Current weight: 87.4 kg  Reviewed weight history on file. Pt's weight appears to have  decreased 13.3% between 05/29/23 and 08/03/23 which is clinically significant for time frame.   Drains/lines: RLQ JP drain: output x24 hours  Medications: acidophilus, colace BID, solu-cortef , prosight, protonix , miralax  daily, IV abx  Labs:  Sodium 131 CBG's 122-200 x24 hours  UOP: x24 hours  NUTRITION - FOCUSED PHYSICAL EXAM: Flowsheet Row Most Recent Value  Orbital Region Severe depletion  Upper Arm Region Severe depletion  Thoracic and Lumbar Region Severe depletion  Buccal Region Severe depletion  Temple Region Severe depletion  Clavicle Bone Region Severe depletion  Clavicle and Acromion Bone Region Severe depletion  Scapular Bone Region Severe depletion  Dorsal Hand Severe depletion  Patellar Region Severe depletion  Anterior Thigh Region Severe depletion  Posterior Calf Region Severe depletion  Edema (RD Assessment) None  Hair Reviewed  Eyes Reviewed  Mouth Reviewed  Skin Reviewed  Nails Reviewed   Diet Order:   Diet Order             Diet regular Room service appropriate? Yes with Assist; Fluid consistency: Thin  Diet effective now                   EDUCATION NEEDS:   Not appropriate for education at this time  Skin:  Skin Assessment: Skin Integrity Issues: Skin Integrity Issues:: Incisions Incisions: closed abdominal incision  Last BM:  1/7  Height:   Ht Readings from Last 1 Encounters:  08/01/23 6' 4 (1.93 m)    Weight:   Wt Readings from Last 1 Encounters:  08/03/23 87.4 kg   BMI:  Body mass index is 23.45 kg/m.  Estimated Nutritional Needs:   Kcal:  2200-2400  Protein:  115-130g  Fluid:  >/=2L  Royce Maris, RDN, LDN Clinical Nutrition

## 2023-08-03 NOTE — Progress Notes (Signed)
 Patient Name: Francisco Padilla Date of Encounter: 08/03/2023 [facility] Cardiologist: Oneil Parchment, MD   Interval Summary  .    Cardiology was asked to come reevaluate the patient. Patient seen and examined at bedside. Resting in bed comfortably. Denies anginal chest pain or heart failure symptoms.  Vital Signs .    Vitals:   08/03/23 1700 08/03/23 1800 08/03/23 1900 08/03/23 1915  BP: (!) 86/74 (!) 61/37 (!) 60/44 (!) 85/72  Pulse: (!) 120 (!) 105 (!) 123 (!) 128  Resp: 15 (!) 22 18 14   Temp:      TempSrc:      SpO2: 97% 96% 96% 96%  Weight:      Height:        Intake/Output Summary (Last 24 hours) at 08/03/2023 1931 Last data filed at 08/03/2023 1600 Gross per 24 hour  Intake 2485.99 ml  Output 1355 ml  Net 1130.99 ml      08/03/2023    5:00 AM 08/02/2023    5:00 AM 08/01/2023    6:28 PM  Last 3 Weights  Weight (lbs) 192 lb 10.9 oz 203 lb 11.3 oz 199 lb 8.3 oz  Weight (kg) 87.4 kg 92.4 kg 90.5 kg      Telemetry/ECG    Telemetry: Atrial fibrillation with rapid ventricular rate- Personally Reviewed  Echocardiogram 07/31/2023: 1. The aortic valve is severely calcified in views obtained. Vmax 2.9  m/s, MG 20 mmHG, AVA 0.96 cm2, DI 0.21. LV SVI low 22 cc/m2. Findings are  concerning for paradoxical low flow low gradient severe aortic stenosis.  If there are clinical concerns for  severe aortic stenosis, would recommend an aortic valve calcium  score for  clarification. The aortic valve is calcified. Aortic valve regurgitation  is not visualized. Moderate aortic valve stenosis. Aortic valve area, by  VTI measures 0.96 cm. Aortic  valve mean gradient measures 20.3 mmHg. Aortic valve Vmax measures 2.93  m/s.   2. Left ventricular ejection fraction, by estimation, is 60 to 65%. The  left ventricle has normal function. The left ventricle has no regional  wall motion abnormalities. Indeterminate diastolic filling due to E-A  fusion.   3. Right ventricular  systolic function is mildly reduced. The right  ventricular size is mildly enlarged. There is normal pulmonary artery  systolic pressure. The estimated right ventricular systolic pressure is  30.2 mmHg.   4. Left atrial size was moderately dilated.   5. Right atrial size was mildly dilated.   6. The mitral valve is grossly normal. Trivial mitral valve  regurgitation. No evidence of mitral stenosis.   7. The inferior vena cava is normal in size with greater than 50%  respiratory variability, suggesting right atrial pressure of 3 mmHg.   Physical Exam .    General: Age-appropriate male, temporal wasting, in no acute distress, hemodynamically stable. HEENT: Normocephalic, atraumatic, no JVP, trachea midline, dry mucous membranes. Cardiac: Irregularly irregular, tachycardic, variable S1-S2 systolic ejection murmur at the second muscle space, no gallops or rubs. Lungs: Clear to auscultation bilaterally: No wheezes rales or rhonchi's. GI: JP drain noted.  Soft, no guarding, bowel sounds present. Extremities: Warm to touch, no pitting edema, right forearm PICC line  Assessment & Plan .     Impression: A-fib with RVR Preoperative cardiovascular examination. Aortic stenosis, severe, paradoxical low-flow low gradient Permanent atrial fibrillation. Long-term anticoagulation. Acute cholecystitis. Chronic hypotension.  Recommendations: Cardiology was asked to evaluate the patient as he was seen in A-fib with RVR. Patient is  known to have permanent atrial fibrillation and his rates are usually less than 110 bpm. However, this morning he was noted to have rapid ventricular rate and soft blood pressures noted .  I suspect the rapid ventricular rate is secondary to the acute stress from recent surgery, fluid shifts, acute blood loss, underlying valvular heart disease, and chronic hypotension.  Recommended that he be resuscitated with IV fluids and consider stress dose steroids as he is on  fludrocortisone  as outpatient.  Recommend keeping a MAP greater than 65 mmHg.  If needed could use pressor support to maintain blood pressures and consider amiodarone drip for rate control strategy.  Reevaluate volume status and make sure he is not becoming infectious/sepsis given his primary problem of gangrenous cholecystitis status post subtotal cholecystectomy.   Clinically denies anginal chest pain or heart failure symptoms.  Patient just had a PICC line placed which still needs to be confirmed.  Recommend checking CVP active further evaluate his volume status.  Cardiology will follow over the weekend.   For questions or updates, please contact  [facility] Please consult [email or web address] for contact info under     Signed, Madonna Large, DO, [facility]  [facility]  [address] [place], [place] [address] Pager: [phone] Office: [phone]

## 2023-08-03 NOTE — Progress Notes (Signed)
 2 Days Post-Op  Subjective: Labs pending.  Drain with >400 bile and bulb full again this morning.   On exam, he is sitting on the bedside commode. He reports gas pains that were relieved with simethicone .  Incisional pain somewhat improved.  BP has been somewhat soft overnight but improved this morning.    ROS: See above, otherwise other systems negative  Objective: Vital signs in last 24 hours: Temp:  [97.2 F (36.2 C)-97.8 F (36.6 C)] 97.2 F (36.2 C) (01/10 0335) Pulse Rate:  [83-127] 97 (01/10 0730) Resp:  [7-29] 15 (01/10 0730) BP: (80-121)/(59-82) 81/68 (01/10 0730) SpO2:  [89 %-99 %] 95 % (01/10 0730) Weight:  [87.4 kg] 87.4 kg (01/10 0500) Last BM Date : 07/31/23  Intake/Output from previous day: 01/09 0701 - 01/10 0700 In: 4290.8 [P.O.:2800; I.V.:790.5; IV Piggyback:700.3] Out: 825 [Urine:400; Drains:425] Intake/Output this shift: No intake/output data recorded.  PE: Gen: NAD Abd: soft, non-distended, appropriately tender, subxiphoid incision with minor erythema, other port sites clean and healthy, JP filled with bile  Lab Results:  Recent Labs    08/02/23 0221 08/02/23 1955  WBC 15.1* 18.3*  HGB 12.0* 12.5*  HCT 36.3* 37.3*  PLT 185 216   BMET Recent Labs    08/01/23 2009 08/02/23 0221  NA 135 133*  K 4.0 4.8  CL 104 101  CO2 20* 19*  GLUCOSE 146* 154*  BUN 19 20  CREATININE 0.75 0.81  CALCIUM  8.8* 8.8*   PT/INR Recent Labs    08/01/23 2009  LABPROT 16.7*  INR 1.3*   CMP     Component Value Date/Time   NA 133 (L) 08/02/2023 0221   K 4.8 08/02/2023 0221   CL 101 08/02/2023 0221   CO2 19 (L) 08/02/2023 0221   GLUCOSE 154 (H) 08/02/2023 0221   BUN 20 08/02/2023 0221   CREATININE 0.81 08/02/2023 0221   CREATININE 0.77 07/19/2023 1128   CALCIUM  8.8 (L) 08/02/2023 0221   PROT 5.8 (L) 08/02/2023 0221   ALBUMIN  2.5 (L) 08/02/2023 0221   AST 32 08/02/2023 0221   ALT 43 08/02/2023 0221   ALKPHOS 69 08/02/2023 0221   BILITOT 1.5  (H) 08/02/2023 0221   GFRNONAA >60 08/02/2023 0221   GFRAA  10/23/2009 0135    >60        The eGFR has been calculated using the MDRD equation. This calculation has not been validated in all clinical situations. eGFR's persistently <60 mL/min signify possible Chronic Kidney Disease.   Lipase     Component Value Date/Time   LIPASE 29 07/29/2023 0014       Studies/Results: No results found.   Anti-infectives: Anti-infectives (From admission, onward)    Start     Dose/Rate Route Frequency Ordered Stop   07/29/23 0800  piperacillin -tazobactam (ZOSYN ) IVPB 3.375 g        3.375 g 12.5 mL/hr over 240 Minutes Intravenous Every 8 hours 07/29/23 0207 08/06/23 2359   07/29/23 0030  piperacillin -tazobactam (ZOSYN ) IVPB 3.375 g        3.375 g 100 mL/hr over 30 Minutes Intravenous  Once 07/29/23 0020 07/29/23 0147        Assessment/Plan Gangrenous cholecystitis s/p lap subtotal cholecystectomy 1/8 - Continue regular diet for now.  He is high risk for ileus so if he develops worsening pain or distention then obtain AXR  - Drain output > with bile. Will discuss possible ERCP with GI - He is high risk for hypovolemia and electrolyte disturbances  given high volume bile loss.  If he continues with high output then would likely benefit from scheduled replacements of bile losses with LR - Trend LFTs. Labs pending this morning.  - Continue antibiotics 5 days post op   LOS: 5 days    Cordella DELENA Idler , MD [facility] 08/03/2023, 8:18 AM Please see Amion for pager number during day hours 7:00am-4:30pm or 7:00am -11:30am on weekends

## 2023-08-03 NOTE — Progress Notes (Signed)
 Peripherally Inserted Central Catheter Placement  The IV Nurse has discussed with the patient and/or persons authorized to consent for the patient, the purpose of this procedure and the potential benefits and risks involved with this procedure.  The benefits include less needle sticks, lab draws from the catheter, and the patient may be discharged home with the catheter. Risks include, but not limited to, infection, bleeding, blood clot (thrombus formation), and puncture of an artery; nerve damage and irregular heartbeat and possibility to perform a PICC exchange if needed/ordered by physician.  Alternatives to this procedure were also discussed.  Bard Power PICC patient education guide, fact sheet on infection prevention and patient information card has been provided to patient /or left at bedside.    PICC Placement Documentation  PICC Double Lumen 08/03/23 Right Basilic 44 cm 0 cm (Active)  Indication for Insertion or Continuance of Line Vasoactive infusions 08/03/23 1741  Exposed Catheter (cm) 0 cm 08/03/23 1741  Site Assessment Clean, Dry, Intact 08/03/23 1741  Lumen #1 Status Flushed;Saline locked;Blood return noted 08/03/23 1741  Lumen #2 Status Flushed;Saline locked;Blood return noted 08/03/23 1741  Dressing Type Transparent;Securing device 08/03/23 1741  Dressing Status Antimicrobial disc/dressing in place;Clean, Dry, Intact 08/03/23 1741  Line Care Connections checked and tightened 08/03/23 1741  Line Adjustment (NICU/IV Team Only) No 08/03/23 1741  Dressing Intervention New dressing;Adhesive placed at insertion site (IV team only);Other (Comment) 08/03/23 1741  Dressing Change Due 08/10/23 08/03/23 1741       Jolee Na 08/03/2023, 5:43 PM

## 2023-08-03 NOTE — Progress Notes (Signed)
 Pt AOx4 attempting to get OOB to void without assistance. Pt irritable and agitated and states he can only pee standing up. Pt assisted to standing position utilizing walker.  Pt very weak and unsteady and overbalanced backwards. Pt is impulsive and not aware of his limitations. Pt unable to void.  Pt seems frustrated Pt asking to go use the toilet but is barely able to shuffle sideways to sit on BSC. Pt assisted to [facility] but unable to void or have BM. MD rounded while pt on [facility] and discussed treatment plan and expained difficulties to patient going forward. Pt asked for stool softeners and MD ordered mirilax and ducosate.

## 2023-08-03 NOTE — Progress Notes (Signed)
 Physical Therapy Treatment Patient Details Name: Francisco Padilla MRN: [id number] DOB: Nov 28, 1950 Today's Date: 08/03/2023   History of Present Illness Pt is a 73 y/o male admitted for sepsis due to acute cholecystitis and AKI. Gangrenous cholecystitis s/p lap subtotal cholecystectomy 1/8.  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 RN reports recent  administration of dilaudid  however also hypotensive. Eager to participate and get OOB. Min assist for bed mobility today. Demonstrates posterior instability in standing. Suspect BP dropped further during transfer as he became less responsive and could not follow commands to safely sit, requiring mod assist +2 for transfer to recliner. BP69/57 (MAP 63). After LEs elevated and reclined BP improved to 79/65, became more alert. HR spiked in 170s during transfer. Now resting comfortably in recliner. RN present and assisted with care. Patient will continue to benefit from skilled physical therapy services to further improve independence with functional mobility.    If plan is discharge home, recommend the following: Assistance with cooking/housework;Direct supervision/assist for medications management;Direct supervision/assist for financial management;Assist for transportation;Supervision due to cognitive status;Help with stairs or ramp for entrance;A lot of help with walking and/or transfers;A lot of help with bathing/dressing/bathroom   Can travel by private vehicle     No  Equipment Recommendations  Rolling walker (2 wheels);BSC/3in1    Recommendations for Other Services       Precautions / Restrictions Precautions Precautions: Fall Precaution Comments: JP drain RUQ. Montior HR and BP Restrictions Weight Bearing Restrictions Per Provider Order: No     Mobility  Bed Mobility Overal bed mobility:  Needs Assistance Bed Mobility: Supine to Sit     Supine to sit: Min assist, HOB elevated, Used rails     General bed mobility comments: Required min assist to rise to EOB. Slow and effortful, VC for technique. Trouble sequencing.    Transfers Overall transfer level: Needs assistance Equipment used: Rolling walker (2 wheels) Transfers: Sit to/from Stand Sit to Stand: Mod assist           General transfer comment: Mod assist for balance to stand, leaning posteriorly. Max cues to facilitate forward weight shift. Good power up once engaged. RW for support.    Ambulation/Gait Ambulation/Gait assistance: +2 safety/equipment, Mod assist Gait Distance (Feet): 5 Feet Assistive device: Rolling walker (2 wheels) Gait Pattern/deviations: Step-to pattern, Trunk flexed, Leaning posteriorly, Shuffle, Decreased stride length, Knees buckling Gait velocity: reduced Gait velocity interpretation: <1.31 ft/sec, indicative of household ambulator   General Gait Details: Mod assist for balance and to progress RW forward. Suspect BPdropped, pt became less responsive to commands and LEs tremulous nearly buckling a few times. Quickly assist to chair, would not bend knees on command. Able to converse throughout but certainly an effort towards end of transfer.   Stairs             Wheelchair Mobility     Tilt Bed    Modified Rankin (Stroke Patients Only)       Balance Overall balance assessment: Needs assistance Sitting-balance support: Feet supported, Single extremity supported Sitting balance-Leahy Scale: Poor   Postural control: Right lateral lean Standing balance support: Reliant on assistive device for balance, Bilateral upper extremity supported Standing balance-Leahy Scale: Poor  Cognition Arousal: Alert Behavior During Therapy: WFL for tasks assessed/performed Overall Cognitive Status: No family/caregiver present to determine  baseline cognitive functioning                                 General Comments: Oriented but with effort. Seems more confused today, mixing up stories and more tangential        Exercises      General Comments General comments (skin integrity, edema, etc.): Pre activity: BP 82/62 HR 128, SpO2 99% on RA. Following transfer to recliner, pt BP 69/57 HR 156 with MAP of 63. LEs elevated and reclined in chair, BP 79/65. RN notified, aware.      Pertinent Vitals/Pain Pain Assessment Pain Assessment: Faces Faces Pain Scale: Hurts a little bit Pain Location: RUQ Pain Descriptors / Indicators: Aching Pain Intervention(s): Monitored during session, Repositioned    Home Living                          Prior Function            PT Goals (current goals can now be found in the care plan section) Acute Rehab PT Goals Patient Stated Goal: to return to independence PT Goal Formulation: With patient Time For Goal Achievement: 08/16/23 Potential to Achieve Goals: Good Progress towards PT goals: Progressing toward goals    Frequency    Min 1X/week      PT Plan      Co-evaluation              AM-PAC PT 6 Clicks Mobility   Outcome Measure  Help needed turning from your back to your side while in a flat bed without using bedrails?: A Little Help needed moving from lying on your back to sitting on the side of a flat bed without using bedrails?: A Little Help needed moving to and from a bed to a chair (including a wheelchair)?: A Lot Help needed standing up from a chair using your arms (e.g., wheelchair or bedside chair)?: A Lot Help needed to walk in hospital room?: A Lot Help needed climbing 3-5 steps with a railing? : Total 6 Click Score: 13    End of Session Equipment Utilized During Treatment: Gait belt Activity Tolerance: Patient tolerated treatment well Patient left: with call bell/phone within reach;in chair;with SCD's reapplied Nurse  Communication: Mobility status;Other (comment) (BP) PT Visit Diagnosis: Other abnormalities of gait and mobility (R26.89);Muscle weakness (generalized) (M62.81);Unsteadiness on feet (R26.81);Difficulty in walking, not elsewhere classified (R26.2);Dizziness and giddiness (R42)     Time: 1132-1202 PT Time Calculation (min) (ACUTE ONLY): 30 min  Charges:    $Therapeutic Activity: 23-37 mins PT General Charges $$ ACUTE PT VISIT: 1 Visit                     Leontine Roads, PT, DPT [facility]  Rehabilitation Services Physical Therapist Office: [phone] Website: [email or web address]    Leontine GORMAN Roads 08/03/2023, 12:43 PM

## 2023-08-04 DIAGNOSIS — E43 Unspecified severe protein-calorie malnutrition: Secondary | ICD-10-CM

## 2023-08-04 DIAGNOSIS — K810 Acute cholecystitis: Secondary | ICD-10-CM | POA: Diagnosis not present

## 2023-08-04 LAB — HEPARIN LEVEL (UNFRACTIONATED)
Heparin Unfractionated: 0.11 [IU]/mL — ABNORMAL LOW (ref 0.30–0.70)
Heparin Unfractionated: 0.13 [IU]/mL — ABNORMAL LOW (ref 0.30–0.70)
Heparin Unfractionated: 0.14 [IU]/mL — ABNORMAL LOW (ref 0.30–0.70)

## 2023-08-04 LAB — CBC
HCT: 32.7 % — ABNORMAL LOW (ref 39.0–52.0)
HCT: 31.5 % — ABNORMAL LOW (ref 39.0–52.0)
Hemoglobin: 11.1 g/dL — ABNORMAL LOW (ref 13.0–17.0)
Hemoglobin: 10.7 g/dL — ABNORMAL LOW (ref 13.0–17.0)
MCH: 30.8 pg (ref 26.0–34.0)
MCH: 31.1 pg (ref 26.0–34.0)
MCHC: 33.9 g/dL (ref 30.0–36.0)
MCHC: 34 g/dL (ref 30.0–36.0)
MCV: 90.8 fL (ref 80.0–100.0)
MCV: 91.6 fL (ref 80.0–100.0)
Platelets: 224 10*3/uL (ref 150–400)
Platelets: 219 10*3/uL (ref 150–400)
RBC: 3.6 MIL/uL — ABNORMAL LOW (ref 4.22–5.81)
RBC: 3.44 MIL/uL — ABNORMAL LOW (ref 4.22–5.81)
RDW: 12.8 % (ref 11.5–15.5)
RDW: 12.8 % (ref 11.5–15.5)
WBC: 27 10*3/uL — ABNORMAL HIGH (ref 4.0–10.5)
WBC: 24.7 10*3/uL — ABNORMAL HIGH (ref 4.0–10.5)
nRBC: 0 % (ref 0.0–0.2)
nRBC: 0 % (ref 0.0–0.2)

## 2023-08-04 LAB — BASIC METABOLIC PANEL
Anion gap: 8 (ref 5–15)
BUN: 22 mg/dL (ref 8–23)
CO2: 25 mmol/L (ref 22–32)
Calcium: 8.8 mg/dL — ABNORMAL LOW (ref 8.9–10.3)
Chloride: 101 mmol/L (ref 98–111)
Creatinine, Ser: 0.67 mg/dL (ref 0.61–1.24)
GFR, Estimated: >60 mL/min (ref >60)
Glucose, Bld: 178 mg/dL — ABNORMAL HIGH (ref 70–99)
Potassium: 4.2 mmol/L (ref 3.5–5.1)
Sodium: 134 mmol/L — ABNORMAL LOW (ref 135–145)

## 2023-08-04 LAB — GLUCOSE, CAPILLARY
Glucose-Capillary: 140 mg/dL — ABNORMAL HIGH (ref 70–99)
Glucose-Capillary: 131 mg/dL — ABNORMAL HIGH (ref 70–99)
Glucose-Capillary: 209 mg/dL — ABNORMAL HIGH (ref 70–99)
Glucose-Capillary: 224 mg/dL — ABNORMAL HIGH (ref 70–99)
Glucose-Capillary: 122 mg/dL — ABNORMAL HIGH (ref 70–99)

## 2023-08-04 MED ORDER — MIDODRINE HCL 5 MG PO TABS
5.0000 mg | ORAL_TABLET | Freq: Three times a day (TID) | ORAL | Status: DC
  Administered 2023-08-04 – 2023-08-10 (×19): 5 mg via ORAL
  Filled 2023-08-04 (×19): qty 1

## 2023-08-04 NOTE — Progress Notes (Signed)
 Assessment & Plan: POD#3 - Gangrenous cholecystitis s/p lap subtotal cholecystectomy 08/01/23 - Dr. Polly - regular diet  - Drain output > , bilious, last 24 hours - Continue antibiotics 5 days post op - WBC 27K - monitor(?)  Will need evaluation at [facility] for ERCP with stent placement given history of Roux-en-Y gastric bypass (done at [facility]).  Apparently GI has signed off, so our service will attempt to contact [name] on Monday.        [name], MD [facility] practice Office: [phone]        Chief Complaint: Gangrenous cholecystitis  Subjective: Patient in chair, eating breakfast.  No complaints.  Objective: Vital signs in last 24 hours: Temp:  [97 F (36.1 C)-98.2 F (36.8 C)] 97 F (36.1 C) (01/11 0724) Pulse Rate:  [82-197] 85 (01/11 0700) Resp:  [8-23] 13 (01/11 0700) BP: (60-104)/(35-82) 101/79 (01/11 0700) SpO2:  [84 %-99 %] 95 % (01/11 0700) Weight:  [89.3 kg] 89.3 kg (01/11 0500) Last BM Date : 07/31/23  Intake/Output from previous day: 01/10 0701 - 01/11 0700 In: 2479.3 [I.V.:328.9; IV Piggyback:2150.4] Out: 2465 [Urine:1850; Drains:615] Intake/Output this shift: Total I/O In: -  Out: 615 [Urine:600; Drains:15]  Physical Exam: HEENT - sclerae clear, mucous membranes moist Neck - soft Abdomen - soft, dressings dry and intact; JP with thin cloudy bilious appearing fluid  Lab Results:  Recent Labs    08/03/23 0845 08/04/23 0321  WBC 25.1* 27.0*  HGB 13.3 11.1*  HCT 39.8 32.7*  PLT 311 224   BMET Recent Labs    08/02/23 0221 08/03/23 0845  NA 133* 131*  K 4.8 3.9  CL 101 98  CO2 19* 25  GLUCOSE 154* 148*  BUN 20 22  CREATININE 0.81 0.89  CALCIUM  8.8* 8.9   PT/INR Recent Labs    08/01/23 2009  LABPROT 16.7*  INR 1.3*   Comprehensive Metabolic Panel:    Component Value Date/Time   NA 131 (L) 08/03/2023 0845   NA 133 (L) 08/02/2023 0221   K 3.9 08/03/2023 0845   K 4.8 08/02/2023 0221    CL 98 08/03/2023 0845   CL 101 08/02/2023 0221   CO2 25 08/03/2023 0845   CO2 19 (L) 08/02/2023 0221   BUN 22 08/03/2023 0845   BUN 20 08/02/2023 0221   CREATININE 0.89 08/03/2023 0845   CREATININE 0.81 08/02/2023 0221   CREATININE 0.77 07/19/2023 1128   GLUCOSE 148 (H) 08/03/2023 0845   GLUCOSE 154 (H) 08/02/2023 0221   CALCIUM  8.9 08/03/2023 0845   CALCIUM  8.8 (L) 08/02/2023 0221   AST 29 08/03/2023 0844   AST 32 08/02/2023 0221   ALT 35 08/03/2023 0844   ALT 43 08/02/2023 0221   ALKPHOS 79 08/03/2023 0844   ALKPHOS 69 08/02/2023 0221   BILITOT 1.1 08/03/2023 0844   BILITOT 1.5 (H) 08/02/2023 0221   PROT 6.0 (L) 08/03/2023 0844   PROT 5.8 (L) 08/02/2023 0221   ALBUMIN  2.4 (L) 08/03/2023 0844   ALBUMIN  2.5 (L) 08/02/2023 0221    Studies/Results: DG CHEST PORT 1 VIEW Result Date: 08/03/2023 CLINICAL DATA:  PICC in place. EXAM: PORTABLE CHEST 1 VIEW COMPARISON:  3 hours prior FINDINGS: Tip of the right upper extremity PICC in the region of the atrial caval junction. Lower lung volumes from earlier today. Stable mediastinal contours, aortic atherosclerosis. Increasing atelectasis in the left lung base and right infrahilar region. No pneumothorax or large pleural effusion. IMPRESSION: 1. Tip  of the right upper extremity PICC in the region of the atrial caval junction. 2. Lower lung volumes with increasing atelectasis in the left lung base and right infrahilar region. Electronically Signed   By: Andrea Gasman M.D.   On: 08/03/2023 20:46   DG Chest Port 1 View Result Date: 08/03/2023 CLINICAL DATA:  Line placement EXAM: PORTABLE CHEST 1 VIEW COMPARISON:  X-ray 07/28/2023.  CT 07/28/2023. FINDINGS: Placement of a right-sided PICC with the tip along the upper right atrium. Stable cardiopericardial silhouette when adjusting for level of underinflation. Calcified aorta. Increasing bandlike changes right perihilar in the left lung base, favoring atelectasis. No pneumothorax, effusion or  edema. Overlapping cardiac leads. The left inferior costophrenic angle is clipped off the edge of the film. Degenerative changes along the spine. IMPRESSION: Right-sided PICC in place with tip along the upper right atrium. Decreased inflation with some increasing atelectasis. Electronically Signed   By: Ranell Bring M.D.   On: 08/03/2023 18:04   US  EKG SITE RITE Result Date: 08/03/2023 If Site Rite image not attached, placement could not be confirmed due to current cardiac rhythm.     [name] 08/04/2023  Patient ID: Francisco Padilla, male   DOB: 1950-11-26, 72 y.o.   MRN: [id number]

## 2023-08-04 NOTE — Progress Notes (Signed)
 PHARMACY - ANTICOAGULATION Pharmacy Consult for Heparin  Indication: atrial fibrillation Brief A/P: Heparin  level subtherapeutic Increase Heparin  rate  Allergies  Allergen Reactions   Aspirin     Avoid due to bariatric surgery   Bupropion Other (See Comments)    Passed out   Nsaids     Avoid due to bariatric surgery   Oxycodone  Hcl     Hallucinations, agitation     Patient Measurements: Height: 6' 4 (193 cm) Weight: 87.4 kg (192 lb 10.9 oz) IBW/kg (Calculated) : 86.8 Heparin  Dosing Weight: 93 kg  Vital Signs: Temp: 98 F (36.7 C) (01/11 0330) Temp Source: Oral (01/11 0330) BP: 98/74 (01/11 0400) Pulse Rate: 83 (01/11 0400)  Labs: Recent Labs    08/01/23 2009 08/02/23 0221 08/02/23 1955 08/03/23 0845 08/04/23 0321  HGB 11.6* 12.0* 12.5* 13.3 11.1*  HCT 34.9* 36.3* 37.3* 39.8 32.7*  PLT 165 185 216 311 224  APTT 40*  --  38* 39*  --   LABPROT 16.7*  --   --   --   --   INR 1.3*  --   --   --   --   HEPARINUNFRC  --   --  0.12* 0.11* 0.11*  CREATININE 0.75 0.81  --  0.89  --     Estimated Creatinine Clearance: 93.5 mL/min (by C-G formula based on SCr of 0.89 mg/dL).  Assessment: 73 y.o. male with h/o Afib s/p cholecystectomy 1/8 for heparin   Goal of Therapy:  Heparin  level 0.3-0.7 units/mL Monitor platelets by anticoagulation protocol: Yes   Plan:  Increase Heparin  1950 units/hr Check heparin  level in 8 hours.  Cathlyn Arrant, PharmD, BCPS

## 2023-08-04 NOTE — Plan of Care (Signed)
  Problem: Education: Goal: Knowledge of General Education information will improve Description: Including pain rating scale, medication(s)/side effects and non-pharmacologic comfort measures 08/04/2023 1850 by Harl Damien HERO, RN Outcome: Progressing 08/04/2023 1846 by Harl Damien HERO, RN Outcome: Not Progressing   Problem: Clinical Measurements: Goal: Respiratory complications will improve Outcome: Progressing   Problem: Activity: Goal: Risk for activity intolerance will decrease Outcome: Progressing   Problem: Nutrition: Goal: Adequate nutrition will be maintained Outcome: Progressing   Problem: Coping: Goal: Level of anxiety will decrease 08/04/2023 1850 by Harl Damien HERO, RN Outcome: Progressing 08/04/2023 1846 by Harl Damien HERO, RN Outcome: Progressing   Problem: Elimination: Goal: Will not experience complications related to bowel motility Outcome: Progressing Goal: Will not experience complications related to urinary retention Outcome: Progressing   Problem: Pain Management: Goal: General experience of comfort will improve 08/04/2023 1850 by Harl Damien HERO, RN Outcome: Progressing 08/04/2023 1846 by Harl Damien HERO, RN Outcome: Progressing

## 2023-08-04 NOTE — Progress Notes (Signed)
 NAME:  Francisco Padilla, MRN:  [id number], DOB:  02-07-1951, LOS: 6 ADMISSION DATE:  07/28/2023, CONSULTATION DATE:  1/8 REFERRING MD:  Dr. Bryn, CHIEF COMPLAINT:  Cholecystitis s/p chole; hypotension   History of Present Illness:   Patient is a 73 year old male with pertinent PMH OSA on CPAP, HLD, chronic hypotension, PAF on Eliquis , PVD presents to [facility] ED on 1/5 with cholecystitis.  Patient complaining of weakness/fatigue going on for about a month.  Patient also having nausea, abdominal pain, diarrhea.  Abdominal pain worse in right upper quadrant.  Denies fever/chills.  Came to [facility] ED on 1/5 for further workup.  On arrival patient noted to be slightly tachycardic 100s and mildly hypotensive 93/73.  CT ABD/pelvis showing acute cholecystitis.  Surgery consulted.  Given IV fluids and Zosyn .  Awaiting surgery 48 hours for Eliquis  washout. Cards consulted for preoperative evaluation of cholecystectomy. Echo on 1/7 showing lvef 60-65%; severe aortic stenosis.  On 1/8 patient taken to the OR for cholecystectomy.  While in the OR patient had tachycardia 150s and started on esmolol .  Patient later became hypotensive and started on neo.  Postop patient taken to ICU while on pressors.  PCCM consulted.  Pertinent  Medical History   Past Medical History:  Diagnosis Date   ADD (attention deficit disorder)    Anxiety 2007   Aortic stenosis    contributor to low systolic BP   Asthma as child   Asymptomatic gallstones    Atrial fibrillation (HCC)    Atrial flutter (HCC)    Chronic hypotension    Per Pt historical systolic BP in 80's   Depression    DJD (degenerative joint disease) of knee    Dysrhythmia    Chronic Atrial Fib- seeing Dr. DOROTHA [facility]   H/O peptic ulcer    past history with GI bleed- cauterization done throught scope   History of kidney stones    pt claims urology discovered a kidney stone last week 05/25/21   Hypercholesterolemia    Orthostatic hypotension    Pneumonia  as child   Pneumothorax on left 05/20/2009   Prediabetes    none now   Pulmonary nodules    Sleep apnea    cpap set on 13   Thrombocytopenia (HCC)    pt denies   Thyromegaly    Varicose veins     Significant Hospital Events: Including procedures, antibiotic start and stop dates in addition to other pertinent events   1/5 admitted with cholecystitis 1/8 s/p cholecystectomy; given esmolol  for HR 150s then became hypotensive on neo-; postop taken to ICU and PCCM consulted 1/10 episode of rapid A-fib with RVR in the 150s, systolic pressure in the 70s.  Interim History / Subjective:   Has been hemodynamically stable overnight though blood pressure still on the low side.  Sitting up in chair at bedside and eating breakfast  Objective   Blood pressure 101/79, pulse 85, temperature (!) 97 F (36.1 C), temperature source Axillary, resp. rate 13, height 6' 4 (1.93 m), weight 89.3 kg, SpO2 95%. CVP:  [5 mmHg-13 mmHg] 13 mmHg      Intake/Output Summary (Last 24 hours) at 08/04/2023 0756 Last data filed at 08/04/2023 0600 Gross per 24 hour  Intake 2479.29 ml  Output 2465 ml  Net 14.29 ml   Filed Weights   08/02/23 0500 08/03/23 0500 08/04/23 0500  Weight: 92.4 kg 87.4 kg 89.3 kg    Examination: Blood pressure 101/79, pulse 85, temperature (!) 97 F (36.1  C), temperature source Axillary, resp. rate 13, height 6' 4 (1.93 m), weight 89.3 kg, SpO2 95%. Gen:      No acute distress HEENT:  EOMI, sclera anicteric Neck:     No masses; no thyromegaly Lungs:    Clear to auscultation bilaterally; normal respiratory effort CV:         Regular rate and rhythm; no murmurs Abd:   Wound dressing, JP drain Ext:    No edema; adequate peripheral perfusion Skin:      Warm and dry; no rash Neuro: alert and oriented x 3 Psych: normal mood and affect   Lab/imaging reviewed Significant for sodium 131, WBC 27, hemoglobin 11.1   Resolved Hospital Problem list     Assessment & Plan:  Acute  gangrenous cholecystitis s/p partial cholecystectomy Postoperative bile leak Sepsis Plan: -surgery following; appreciate recs -cont zosyn ; follow cultures -Seen by GI yesterday.  If he needs an ERCP then will need transfer to [name] due to complicated anatomy with history of Roux-en-Y  Acute on chronic hypotension -On Florinef  at home; given esmolol  for tachycardia and OR then became hypotensive requiring neo- Plan: -BP stable -cont florinef  and Solu-Cortef  -Add midodrine   Chronic afib Severe aortic stenosis HLD Plan: -cards following; appreciate recs -tele monitoring -per surgery ok to try heparin  w/ no bolus; monitor for bleeding   AKI Hypokalemia Hyponatremia: likely hypovolemia Plan: -P.o. intake -Trend BMP / urinary output -Replace electrolytes as indicated -Avoid nephrotoxic agents, ensure adequate renal perfusion  Peripheral neuropathy Plan: -gabapentin   OSA on CPAP Plan: -cont home cpap qhs   Best Practice (right click and Reselect all SmartList Selections daily)   Diet/type: clear liquids DVT prophylaxis systemic heparin  Pressure ulcer(s): N/A GI prophylaxis: PPI Lines: N/A Foley:  N/A Code Status:  full code Last date of multidisciplinary goals of care discussion [1/9 patient updated at bedside]  Critical care time:   The patient is critically ill with multiple organ system failure and requires high complexity decision making for assessment and support, frequent evaluation and titration of therapies, advanced monitoring, review of radiographic studies and interpretation of complex data.   Critical Care Time devoted to patient care services, exclusive of separately billable procedures, described in this note is 35 minutes.   Fleeta Kunde MD [facility] See Amion for pager  If no response to pager , please call [phone] until 7pm After 7:00 pm call Elink  [phone] 08/04/2023, 8:42 AM

## 2023-08-04 NOTE — Progress Notes (Signed)
 Progress Note  Patient Name: Francisco Padilla Date of Encounter: 08/04/2023  Primary Cardiologist: Oneil Parchment, MD   Subjective   Patient seen and examined at his bedside.   Inpatient Medications    Scheduled Meds:  acetaminophen   650 mg Oral Q4H   Or   acetaminophen   650 mg Rectal Q4H   acidophilus  1 capsule Oral Daily   alfuzosin   10 mg Oral Q breakfast   calcium  carbonate  200 mg of elemental calcium  Oral TID   Chlorhexidine  Gluconate Cloth  6 each Topical Daily   docusate sodium   100 mg Oral BID   feeding supplement  237 mL Oral BID BM   fludrocortisone   0.1 mg Oral Daily   gabapentin   600 mg Oral QHS   hydrocortisone  sod succinate (SOLU-CORTEF ) inj  100 mg Intravenous Q8H   insulin  aspart  2-6 Units Subcutaneous Q4H   latanoprost   1 drop Both Eyes QHS   methocarbamol   750 mg Oral BID   multivitamin with minerals  1 tablet Oral BID   pantoprazole   40 mg Oral Daily   polyethylene glycol  17 g Oral Daily   sodium chloride  flush  3 mL Intravenous Q12H   venlafaxine  XR  37.5 mg Oral Q breakfast   Continuous Infusions:  heparin  1,950 Units/hr (08/04/23 0600)   piperacillin -tazobactam (ZOSYN )  IV 3.375 g (08/04/23 0835)   PRN Meds: HYDROmorphone  (DILAUDID ) injection, LORazepam , senna-docusate, simethicone , zolpidem    Vital Signs    Vitals:   08/04/23 0630 08/04/23 0645 08/04/23 0700 08/04/23 0724  BP: 103/78 104/78 101/79   Pulse: 86 82 85   Resp: 14 12 13    Temp:    (!) 97 F (36.1 C)  TempSrc:    Axillary  SpO2: 96% 96% 95%   Weight:      Height:        Intake/Output Summary (Last 24 hours) at 08/04/2023 1039 Last data filed at 08/04/2023 0835 Gross per 24 hour  Intake 2418.38 ml  Output 2990 ml  Net -571.62 ml   Filed Weights   08/02/23 0500 08/03/23 0500 08/04/23 0500  Weight: 92.4 kg 87.4 kg 89.3 kg    Telemetry    - Personally Reviewed  ECG     - Personally Reviewed  Physical Exam     General: Comfortable, sitting up in a chair Head:  Atraumatic, normal size  Eyes: PEERLA, EOMI  Neck: Supple, normal JVD Cardiac: Normal S1, S2; RRR; no murmurs, rubs, or gallops Lungs: Clear to auscultation bilaterally Abd: Soft, nontender, no hepatomegaly  Ext: warm, no edema Musculoskeletal: No deformities, BUE and BLE strength normal and equal Skin: Warm and dry, no rashes   Neuro: Alert and oriented to person, place, time, and situation, CNII-XII grossly intact, no focal deficits  Psych: Normal mood and affect   Labs    Chemistry Recent Labs  Lab 08/01/23 2009 08/02/23 0221 08/03/23 0844 08/03/23 0845  NA 135 133*  --  131*  K 4.0 4.8  --  3.9  CL 104 101  --  98  CO2 20* 19*  --  25  GLUCOSE 146* 154*  --  148*  BUN 19 20  --  22  CREATININE 0.75 0.81  --  0.89  CALCIUM  8.8* 8.8*  --  8.9  PROT 5.5* 5.8* 6.0*  --   ALBUMIN  2.5* 2.5* 2.4*  --   AST 34 32 29  --   ALT 46* 43 35  --   ALKPHOS  73 69 79  --   BILITOT 1.3* 1.5* 1.1  --   GFRNONAA >60 >60  --  >60  ANIONGAP 11 13  --  8     Hematology Recent Labs  Lab 08/02/23 1955 08/03/23 0845 08/04/23 0321  WBC 18.3* 25.1* 27.0*  RBC 4.07* 4.30 3.60*  HGB 12.5* 13.3 11.1*  HCT 37.3* 39.8 32.7*  MCV 91.6 92.6 90.8  MCH 30.7 30.9 30.8  MCHC 33.5 33.4 33.9  RDW 12.9 13.0 12.8  PLT 216 311 224    Cardiac EnzymesNo results for input(s): TROPONINI in the last 168 hours. No results for input(s): TROPIPOC in the last 168 hours.   BNPNo results for input(s): BNP, PROBNP in the last 168 hours.   DDimer No results for input(s): DDIMER in the last 168 hours.   Radiology    DG CHEST PORT 1 VIEW Result Date: 08/03/2023 CLINICAL DATA:  PICC in place. EXAM: PORTABLE CHEST 1 VIEW COMPARISON:  3 hours prior FINDINGS: Tip of the right upper extremity PICC in the region of the atrial caval junction. Lower lung volumes from earlier today. Stable mediastinal contours, aortic atherosclerosis. Increasing atelectasis in the left lung base and right infrahilar  region. No pneumothorax or large pleural effusion. IMPRESSION: 1. Tip of the right upper extremity PICC in the region of the atrial caval junction. 2. Lower lung volumes with increasing atelectasis in the left lung base and right infrahilar region. Electronically Signed   By: Andrea Gasman M.D.   On: 08/03/2023 20:46   DG Chest Port 1 View Result Date: 08/03/2023 CLINICAL DATA:  Line placement EXAM: PORTABLE CHEST 1 VIEW COMPARISON:  X-ray 07/28/2023.  CT 07/28/2023. FINDINGS: Placement of a right-sided PICC with the tip along the upper right atrium. Stable cardiopericardial silhouette when adjusting for level of underinflation. Calcified aorta. Increasing bandlike changes right perihilar in the left lung base, favoring atelectasis. No pneumothorax, effusion or edema. Overlapping cardiac leads. The left inferior costophrenic angle is clipped off the edge of the film. Degenerative changes along the spine. IMPRESSION: Right-sided PICC in place with tip along the upper right atrium. Decreased inflation with some increasing atelectasis. Electronically Signed   By: Ranell Bring M.D.   On: 08/03/2023 18:04   US  EKG SITE RITE Result Date: 08/03/2023 If Site Rite image not attached, placement could not be confirmed due to current cardiac rhythm.   Cardiac Studies   Echo   Patient Profile     73 y.o. male with atrial fibrillation with rvr, severe aortic stenosisi, Permanent atrial fibrillation   Assessment & Plan    A-fib with RVR Preoperative cardiovascular examination. Aortic stenosis, severe, paradoxical low-flow low gradient Permanent atrial fibrillation. Long-term anticoagulation. Acute cholecystitis. Chronic hypotension.   Agree with restarting his heparin  gtt. Will continue to strive for rate control His hypotension is chronic continue florinef , midodrine  and solu-coref Avoid  nephrotoxins     For questions or updates, please contact [facility] Please consult [email or web address] for  contact info under Cardiology/STEMI.      Signed, Mella Inclan, DO  08/04/2023, 10:39 AM

## 2023-08-04 NOTE — Progress Notes (Signed)
 PHARMACY - ANTICOAGULATION Pharmacy Consult for Heparin  Indication: atrial fibrillation   Allergies  Allergen Reactions   Aspirin     Avoid due to bariatric surgery   Bupropion Other (See Comments)    Passed out   Nsaids     Avoid due to bariatric surgery   Oxycodone  Hcl     Hallucinations, agitation     Patient Measurements: Height: 6' 4 (193 cm) Weight: 98.7 kg (217 lb 9.5 oz) IBW/kg (Calculated) : 86.8 Heparin  Dosing Weight: 93 kg  Vital Signs: Temp: 98.2 F (36.8 C) (01/11 2123) Temp Source: Oral (01/11 2123) BP: 107/84 (01/11 2123) Pulse Rate: 83 (01/11 2123)  Labs: Recent Labs    08/02/23 0221 08/02/23 0221 08/02/23 1955 08/03/23 0845 08/04/23 0321 08/04/23 1155 08/04/23 1429 08/04/23 1521 08/04/23 2100  HGB 12.0*  --  12.5* 13.3 11.1*  --  10.7*  --   --   HCT 36.3*  --  37.3* 39.8 32.7*  --  31.5*  --   --   PLT 185  --  216 311 224  --  219  --   --   APTT  --   --  38* 39*  --   --   --   --   --   HEPARINUNFRC  --    < > 0.12* 0.11* 0.11* 0.13*  --   --  0.14*  CREATININE 0.81  --   --  0.89  --   --   --  0.67  --    < > = values in this interval not displayed.    Estimated Creatinine Clearance: 104 mL/min (by C-G formula based on SCr of 0.67 mg/dL).  Assessment: 73 y.o. male with h/o Afib s/p cholecystectomy 1/8 for heparin   Heparin  level remains subtherapeutic at 0.14 despite several dose increases now heparin  drip running 2100 uts/hr. Noted cautious dosing due to bleeding from JP drains. No further bleeding per RN.   Goal of Therapy:  Heparin  level 0.3-0.7 units/mL Monitor platelets by anticoagulation protocol: Yes   Plan:  Increase Heparin  2200 units/hr Daily heparin  level and cbc    Olam Chalk Pharm.D. CPP, BCPS Clinical Pharmacist [phone] 08/04/2023 10:23 PM

## 2023-08-04 NOTE — Plan of Care (Deleted)
  Problem: Coping: Goal: Level of anxiety will decrease Outcome: Progressing   Problem: Elimination: Goal: Will not experience complications related to bowel motility Outcome: Progressing   Problem: Pain Management: Goal: General experience of comfort will improve Outcome: Progressing   Problem: Education: Goal: Knowledge of General Education information will improve Description: Including pain rating scale, medication(s)/side effects and non-pharmacologic comfort measures Outcome: Not Progressing   Problem: Health Behavior/Discharge Planning: Goal: Ability to manage health-related needs will improve Outcome: Not Progressing   Problem: Clinical Measurements: Goal: Ability to maintain clinical measurements within normal limits will improve Outcome: Not Progressing Goal: Will remain free from infection Outcome: Not Progressing

## 2023-08-04 NOTE — Progress Notes (Signed)
 PHARMACY - ANTICOAGULATION Pharmacy Consult for Heparin  Indication: atrial fibrillation Brief A/P: Heparin  level subtherapeutic Increase Heparin  rate  Allergies  Allergen Reactions   Aspirin     Avoid due to bariatric surgery   Bupropion Other (See Comments)    Passed out   Nsaids     Avoid due to bariatric surgery   Oxycodone  Hcl     Hallucinations, agitation     Patient Measurements: Height: 6' 4 (193 cm) Weight: 89.3 kg (196 lb 13.9 oz) IBW/kg (Calculated) : 86.8 Heparin  Dosing Weight: 93 kg  Vital Signs: Temp: 97.7 F (36.5 C) (01/11 1129) Temp Source: Oral (01/11 1129) BP: 101/79 (01/11 0700) Pulse Rate: 85 (01/11 0700)  Labs: Recent Labs    08/01/23 2009 08/02/23 0221 08/02/23 0221 08/02/23 1955 08/03/23 0845 08/04/23 0321 08/04/23 1155  HGB 11.6* 12.0*  --  12.5* 13.3 11.1*  --   HCT 34.9* 36.3*  --  37.3* 39.8 32.7*  --   PLT 165 185  --  216 311 224  --   APTT 40*  --   --  38* 39*  --   --   LABPROT 16.7*  --   --   --   --   --   --   INR 1.3*  --   --   --   --   --   --   HEPARINUNFRC  --   --    < > 0.12* 0.11* 0.11* 0.13*  CREATININE 0.75 0.81  --   --  0.89  --   --    < > = values in this interval not displayed.    Estimated Creatinine Clearance: 93.5 mL/min (by C-G formula based on SCr of 0.89 mg/dL).  Assessment: 73 y.o. male with h/o Afib s/p cholecystectomy 1/8 for heparin   Heparin  level remains subtherapeutic despite several dose increases (0.13). Noted cautious dosing due to bleeding from JP drains. No further bleeding per RN.   Goal of Therapy:  Heparin  level 0.3-0.7 units/mL Monitor platelets by anticoagulation protocol: Yes   Plan:  Increase Heparin  2100 units/hr Check heparin  level in 8 hours.  Rankin Sams, PharmD, BCPS, BCCCP Clinical Pharmacist

## 2023-08-05 DIAGNOSIS — K810 Acute cholecystitis: Secondary | ICD-10-CM | POA: Diagnosis not present

## 2023-08-05 DIAGNOSIS — I350 Nonrheumatic aortic (valve) stenosis: Secondary | ICD-10-CM | POA: Diagnosis not present

## 2023-08-05 DIAGNOSIS — R918 Other nonspecific abnormal finding of lung field: Secondary | ICD-10-CM

## 2023-08-05 DIAGNOSIS — K839 Disease of biliary tract, unspecified: Secondary | ICD-10-CM

## 2023-08-05 DIAGNOSIS — E43 Unspecified severe protein-calorie malnutrition: Secondary | ICD-10-CM

## 2023-08-05 DIAGNOSIS — I4891 Unspecified atrial fibrillation: Secondary | ICD-10-CM | POA: Diagnosis not present

## 2023-08-05 DIAGNOSIS — N179 Acute kidney failure, unspecified: Secondary | ICD-10-CM | POA: Diagnosis not present

## 2023-08-05 LAB — BASIC METABOLIC PANEL
Anion gap: 8 (ref 5–15)
BUN: 20 mg/dL (ref 8–23)
CO2: 25 mmol/L (ref 22–32)
Calcium: 8.7 mg/dL — ABNORMAL LOW (ref 8.9–10.3)
Chloride: 99 mmol/L (ref 98–111)
Creatinine, Ser: 0.57 mg/dL — ABNORMAL LOW (ref 0.61–1.24)
GFR, Estimated: >60 mL/min (ref >60)
Glucose, Bld: 141 mg/dL — ABNORMAL HIGH (ref 70–99)
Potassium: 4.1 mmol/L (ref 3.5–5.1)
Sodium: 132 mmol/L — ABNORMAL LOW (ref 135–145)

## 2023-08-05 LAB — GLUCOSE, CAPILLARY
Glucose-Capillary: 118 mg/dL — ABNORMAL HIGH (ref 70–99)
Glucose-Capillary: 133 mg/dL — ABNORMAL HIGH (ref 70–99)
Glucose-Capillary: 151 mg/dL — ABNORMAL HIGH (ref 70–99)
Glucose-Capillary: 159 mg/dL — ABNORMAL HIGH (ref 70–99)
Glucose-Capillary: 215 mg/dL — ABNORMAL HIGH (ref 70–99)

## 2023-08-05 LAB — CBC
HCT: 31.5 % — ABNORMAL LOW (ref 39.0–52.0)
Hemoglobin: 10.6 g/dL — ABNORMAL LOW (ref 13.0–17.0)
MCH: 30.7 pg (ref 26.0–34.0)
MCHC: 33.7 g/dL (ref 30.0–36.0)
MCV: 91.3 fL (ref 80.0–100.0)
Platelets: 252 10*3/uL (ref 150–400)
RBC: 3.45 MIL/uL — ABNORMAL LOW (ref 4.22–5.81)
RDW: 12.8 % (ref 11.5–15.5)
WBC: 24.7 10*3/uL — ABNORMAL HIGH (ref 4.0–10.5)
nRBC: 0 % (ref 0.0–0.2)

## 2023-08-05 LAB — HEPARIN LEVEL (UNFRACTIONATED)
Heparin Unfractionated: 0.13 [IU]/mL — ABNORMAL LOW (ref 0.30–0.70)
Heparin Unfractionated: 0.12 [IU]/mL — ABNORMAL LOW (ref 0.30–0.70)
Heparin Unfractionated: 0.18 [IU]/mL — ABNORMAL LOW (ref 0.30–0.70)

## 2023-08-05 NOTE — Assessment & Plan Note (Addendum)
 Gangrenous cholecystitis, complicated with sepsis.  Sp cholecystectomy, sepsis has resolved.  Apparent persistent bile leak with drain output 235 ml.   Wbc is 17.3  Considering increased drain output, will resume IV Zosyn .  Continue blood pressure monitoring.   Continue with solu cortef  and midodrine  for chronic hypotension. Stress dose steroids have been discontinued.  Currently in the waiting list for [facility] under GI, for endoscopic attempts to control leak as opposed to surgical access.

## 2023-08-05 NOTE — Progress Notes (Signed)
 Progress Note  Patient Name: Francisco Padilla Date of Encounter: 08/05/2023  Primary Cardiologist: Oneil Parchment, MD   Subjective   Patient seen and examined at his bedside.   Inpatient Medications    Scheduled Meds:  acetaminophen   650 mg Oral Q4H   Or   acetaminophen   650 mg Rectal Q4H   acidophilus  1 capsule Oral Daily   alfuzosin   10 mg Oral Q breakfast   calcium  carbonate  200 mg of elemental calcium  Oral TID   Chlorhexidine  Gluconate Cloth  6 each Topical Daily   docusate sodium   100 mg Oral BID   feeding supplement  237 mL Oral BID BM   fludrocortisone   0.1 mg Oral Daily   gabapentin   600 mg Oral QHS   hydrocortisone  sod succinate (SOLU-CORTEF ) inj  100 mg Intravenous Q8H   insulin  aspart  2-6 Units Subcutaneous Q4H   latanoprost   1 drop Both Eyes QHS   midodrine   5 mg Oral TID WC   multivitamin with minerals  1 tablet Oral BID   pantoprazole   40 mg Oral Daily   polyethylene glycol  17 g Oral Daily   sodium chloride  flush  3 mL Intravenous Q12H   venlafaxine  XR  37.5 mg Oral Q breakfast   Continuous Infusions:  heparin  2,400 Units/hr (08/05/23 0626)   piperacillin -tazobactam (ZOSYN )  IV 3.375 g (08/05/23 0949)   PRN Meds: HYDROmorphone  (DILAUDID ) injection, LORazepam , senna-docusate, simethicone , zolpidem    Vital Signs    Vitals:   08/05/23 0841 08/05/23 0900 08/05/23 0945 08/05/23 1155  BP: (!) 85/60 92/62 108/79 121/78  Pulse:   83 78  Resp:   20 19  Temp: 97.6 F (36.4 C)   98 F (36.7 C)  TempSrc: Axillary   Oral  SpO2:   99% 98%  Weight:      Height:        Intake/Output Summary (Last 24 hours) at 08/05/2023 1248 Last data filed at 08/05/2023 0900 Gross per 24 hour  Intake 697.47 ml  Output 2865 ml  Net -2167.53 ml   Filed Weights   08/04/23 0500 08/04/23 2123 08/05/23 0420  Weight: 89.3 kg 98.7 kg 98.5 kg    Telemetry    - Personally Reviewed  ECG     - Personally Reviewed  Physical Exam     General: Comfortable, sitting up in  a chair Head: Atraumatic, normal size  Eyes: PEERLA, EOMI  Neck: Supple, normal JVD Cardiac: Normal S1, S2; RRR; no murmurs, rubs, or gallops Lungs: Clear to auscultation bilaterally Abd: Soft, nontender, no hepatomegaly  Ext: warm, no edema Musculoskeletal: No deformities, BUE and BLE strength normal and equal Skin: Warm and dry, no rashes   Neuro: Alert and oriented to person, place, time, and situation, CNII-XII grossly intact, no focal deficits  Psych: Normal mood and affect   Labs    Chemistry Recent Labs  Lab 08/01/23 2009 08/02/23 0221 08/03/23 0844 08/03/23 0845 08/04/23 1521 08/05/23 0513  NA 135 133*  --  131* 134* 132*  K 4.0 4.8  --  3.9 4.2 4.1  CL 104 101  --  98 101 99  CO2 20* 19*  --  25 25 25   GLUCOSE 146* 154*  --  148* 178* 141*  BUN 19 20  --  22 22 20   CREATININE 0.75 0.81  --  0.89 0.67 0.57*  CALCIUM  8.8* 8.8*  --  8.9 8.8* 8.7*  PROT 5.5* 5.8* 6.0*  --   --   --  ALBUMIN  2.5* 2.5* 2.4*  --   --   --   AST 34 32 29  --   --   --   ALT 46* 43 35  --   --   --   ALKPHOS 73 69 79  --   --   --   BILITOT 1.3* 1.5* 1.1  --   --   --   GFRNONAA >60 >60  --  >60 >60 >60  ANIONGAP 11 13  --  8 8 8      Hematology Recent Labs  Lab 08/04/23 0321 08/04/23 1429 08/05/23 0513  WBC 27.0* 24.7* 24.7*  RBC 3.60* 3.44* 3.45*  HGB 11.1* 10.7* 10.6*  HCT 32.7* 31.5* 31.5*  MCV 90.8 91.6 91.3  MCH 30.8 31.1 30.7  MCHC 33.9 34.0 33.7  RDW 12.8 12.8 12.8  PLT 224 219 252    Cardiac EnzymesNo results for input(s): TROPONINI in the last 168 hours. No results for input(s): TROPIPOC in the last 168 hours.   BNPNo results for input(s): BNP, PROBNP in the last 168 hours.   DDimer No results for input(s): DDIMER in the last 168 hours.   Radiology    DG CHEST PORT 1 VIEW Result Date: 08/03/2023 CLINICAL DATA:  PICC in place. EXAM: PORTABLE CHEST 1 VIEW COMPARISON:  3 hours prior FINDINGS: Tip of the right upper extremity PICC in the region of  the atrial caval junction. Lower lung volumes from earlier today. Stable mediastinal contours, aortic atherosclerosis. Increasing atelectasis in the left lung base and right infrahilar region. No pneumothorax or large pleural effusion. IMPRESSION: 1. Tip of the right upper extremity PICC in the region of the atrial caval junction. 2. Lower lung volumes with increasing atelectasis in the left lung base and right infrahilar region. Electronically Signed   By: Andrea Gasman M.D.   On: 08/03/2023 20:46   DG Chest Port 1 View Result Date: 08/03/2023 CLINICAL DATA:  Line placement EXAM: PORTABLE CHEST 1 VIEW COMPARISON:  X-ray 07/28/2023.  CT 07/28/2023. FINDINGS: Placement of a right-sided PICC with the tip along the upper right atrium. Stable cardiopericardial silhouette when adjusting for level of underinflation. Calcified aorta. Increasing bandlike changes right perihilar in the left lung base, favoring atelectasis. No pneumothorax, effusion or edema. Overlapping cardiac leads. The left inferior costophrenic angle is clipped off the edge of the film. Degenerative changes along the spine. IMPRESSION: Right-sided PICC in place with tip along the upper right atrium. Decreased inflation with some increasing atelectasis. Electronically Signed   By: Ranell Bring M.D.   On: 08/03/2023 18:04   US  EKG SITE RITE Result Date: 08/03/2023 If Site Rite image not attached, placement could not be confirmed due to current cardiac rhythm.   Cardiac Studies   Echo   Patient Profile     73 y.o. male with atrial fibrillation with rvr, severe aortic stenosisi, Permanent atrial fibrillation   Assessment & Plan    A-fib with RVR Preoperative cardiovascular examination. Aortic stenosis, severe, paradoxical low-flow low gradient Permanent atrial fibrillation. Long-term anticoagulation. Acute cholecystitis- likely going to [facility] for ERCP and stent Chronic hypotension.   Continue his heparin  gtt. Will continue to  strive for rate control His hypotension is chronic continue florinef , midodrine  and solu-coref - blood pressure stable.  Avoid  nephrotoxins Antibiotics per the primary team    For questions or updates, please contact [facility] Please consult [email or web address] for contact info under Cardiology/STEMI.      Signed, Shonta Bourque, DO  08/05/2023, 12:48 PM

## 2023-08-05 NOTE — Progress Notes (Signed)
 PHARMACY - ANTICOAGULATION Pharmacy Consult for Heparin  Indication: atrial fibrillation Brief A/P: Heparin  level subtherapeutic Increase Heparin  rate  Allergies  Allergen Reactions   Aspirin     Avoid due to bariatric surgery   Bupropion Other (See Comments)    Passed out   Nsaids     Avoid due to bariatric surgery   Oxycodone  Hcl     Hallucinations, agitation     Patient Measurements: Height: 6' 4 (193 cm) Weight: 98.5 kg (217 lb 2.5 oz) IBW/kg (Calculated) : 86.8 Heparin  Dosing Weight: 93 kg  Vital Signs: Temp: 97.9 F (36.6 C) (01/12 0420) Temp Source: Oral (01/12 0420) BP: 107/74 (01/12 0420) Pulse Rate: 94 (01/12 0420)  Labs: Recent Labs    08/02/23 1955 08/03/23 0845 08/04/23 0321 08/04/23 1155 08/04/23 1429 08/04/23 1521 08/04/23 2100 08/05/23 0513  HGB 12.5* 13.3 11.1*  --  10.7*  --   --  10.6*  HCT 37.3* 39.8 32.7*  --  31.5*  --   --  31.5*  PLT 216 311 224  --  219  --   --  252  APTT 38* 39*  --   --   --   --   --   --   HEPARINUNFRC 0.12* 0.11* 0.11* 0.13*  --   --  0.14* 0.13*  CREATININE  --  0.89  --   --   --  0.67  --  0.57*    Estimated Creatinine Clearance: 104 mL/min (A) (by C-G formula based on SCr of 0.57 mg/dL (L)).  Assessment: 73 y.o. male with h/o Afib s/p cholecystectomy 1/8 for heparin   Goal of Therapy:  Heparin  level 0.3-0.7 units/mL Monitor platelets by anticoagulation protocol: Yes   Plan:  Increase Heparin  2400 units/hr Check heparin  level in 8 hours.  Cathlyn Arrant, PharmD, BCPS

## 2023-08-05 NOTE — Progress Notes (Signed)
  Progress Note   Patient: Francisco Padilla FMW:[id number] DOB: 05/24/1951 DOA: 07/28/2023     7 DOS: the patient was seen and examined on 08/05/2023   Brief hospital course: Patient is a 73 year old male with pertinent PMH OSA on CPAP, HLD, chronic hypotension, PAF on Eliquis , PVD presents to [facility] ED on 1/5 with cholecystitis.   Patient complaining of weakness/fatigue going on for about a month.  Patient also having nausea, abdominal pain, diarrhea.  Abdominal pain worse in right upper quadrant.  Denies fever/chills.  Came to [facility] ED on 1/5 for further workup.  On arrival patient noted to be slightly tachycardic 100s and mildly hypotensive 93/73.  CT ABD/pelvis showing acute cholecystitis.  Surgery consulted.  Given IV fluids and Zosyn .  Awaiting surgery 48 hours for Eliquis  washout. Cards consulted for preoperative evaluation of cholecystectomy. Echo on 1/7 showing lvef 60-65%; severe aortic stenosis.  On 1/8 patient taken to the OR for cholecystectomy.  While in the OR patient had tachycardia 150s and started on esmolol .  Patient later became hypotensive and started on neo.  Postop patient taken to ICU while on pressors.  PCCM consulted.  1/5 admitted with cholecystitis 1/8 s/p cholecystectomy; given esmolol  for HR 150s then became hypotensive on neo-; postop taken to ICU and PCCM consulted 1/10 episode of rapid A-fib with RVR in the 150s, systolic pressure in the 70s.  Assessment and Plan: No notes have been filed under this hospital service. Service: Hospitalist       Subjective: Patient with no abdominal pain, no nausea or vomiting. No dyspnea.   Physical Exam: Vitals:   08/05/23 0841 08/05/23 0900 08/05/23 0945 08/05/23 1155  BP: (!) 85/60 92/62 108/79 121/78  Pulse:   83 78  Resp:   20 19  Temp: 97.6 F (36.4 C)   98 F (36.7 C)  TempSrc: Axillary   Oral  SpO2:   99% 98%  Weight:      Height:       Neurology awake and alert ENT with mild pallor Cardiovascular with S1 and S2  present and regular with no gallops, or rubs positive systolic murmur at the base.  Respiratory with no rales or wheezing Abdomen soft and not distended No lower extremity edema  Data Reviewed:    Family Communication: no family at the bedside   Disposition: Status is: Inpatient Remains inpatient appropriate because: IV antibiotics   Planned Discharge Destination: Home      Author: Elidia Toribio Furnace, MD 08/05/2023 2:34 PM  For on call review [email or web address].

## 2023-08-05 NOTE — Assessment & Plan Note (Addendum)
 Hyperkalemia and hyponatremia.   Stable renal function with serum cr at 0,69 with K at 3,5 and serum bicarbonate at 28  Na 133   Check renal panel in am.

## 2023-08-05 NOTE — Assessment & Plan Note (Addendum)
 Improved rate control Will transition back to IV heparin in preparation for possible ERCP ,

## 2023-08-05 NOTE — Progress Notes (Signed)
 Assessment & Plan: POD#4 - Gangrenous cholecystitis s/p lap subtotal cholecystectomy 08/01/23 - Dr. Polly - regular diet  - Drain output ~30 mL last 24 hours - marked decrease from >600 mL prior - Continue antibiotics 5 days post op   Will monitor drain output next 24 hours.  If remains low, may not require ERCP with stent placement.  May need follow up CT scan to rule out intra-abdominal collection.  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).        [name], MD [facility] practice Office: [phone]        Chief Complaint: Gangrenous cholecystitis  Subjective: Patient in bed, just finished breakfast.  Denies pain.  Objective: Vital signs in last 24 hours: Temp:  [96.9 F (36.1 C)-98.2 F (36.8 C)] 97.9 F (36.6 C) (01/12 0420) Pulse Rate:  [78-99] 94 (01/12 0420) Resp:  [10-22] 22 (01/12 0420) BP: (83-121)/(56-90) 85/60 (01/12 0841) SpO2:  [86 %-99 %] 98 % (01/12 0420) Weight:  [98.5 kg-98.7 kg] 98.5 kg (01/12 0420) Last BM Date : 07/31/23  Intake/Output from previous day: 01/11 0701 - 01/12 0700 In: 837 [P.O.:480; I.V.:262.7; IV Piggyback:94.3] Out: 2980 [Urine:2950; Drains:30] Intake/Output this shift: No intake/output data recorded.  Physical Exam: HEENT - sclerae clear, mucous membranes moist Abdomen - soft, non-tender; drain with small thin brownish output (30 cc overnight)  Lab Results:  Recent Labs    08/04/23 1429 08/05/23 0513  WBC 24.7* 24.7*  HGB 10.7* 10.6*  HCT 31.5* 31.5*  PLT 219 252   BMET Recent Labs    08/04/23 1521 08/05/23 0513  NA 134* 132*  K 4.2 4.1  CL 101 99  CO2 25 25  GLUCOSE 178* 141*  BUN 22 20  CREATININE 0.67 0.57*  CALCIUM  8.8* 8.7*   PT/INR No results for input(s): LABPROT, INR in the last 72 hours. Comprehensive Metabolic Panel:    Component Value Date/Time   NA 132 (L)  08/05/2023 0513   NA 134 (L) 08/04/2023 1521   K 4.1 08/05/2023 0513   K 4.2 08/04/2023 1521   CL 99 08/05/2023 0513   CL 101 08/04/2023 1521   CO2 25 08/05/2023 0513   CO2 25 08/04/2023 1521   BUN 20 08/05/2023 0513   BUN 22 08/04/2023 1521   CREATININE 0.57 (L) 08/05/2023 0513   CREATININE 0.67 08/04/2023 1521   CREATININE 0.77 07/19/2023 1128   GLUCOSE 141 (H) 08/05/2023 0513   GLUCOSE 178 (H) 08/04/2023 1521   CALCIUM  8.7 (L) 08/05/2023 0513   CALCIUM  8.8 (L) 08/04/2023 1521   AST 29 08/03/2023 0844   AST 32 08/02/2023 0221   ALT 35 08/03/2023 0844   ALT 43 08/02/2023 0221   ALKPHOS 79 08/03/2023 0844   ALKPHOS 69 08/02/2023 0221   BILITOT 1.1 08/03/2023 0844   BILITOT 1.5 (H) 08/02/2023 0221   PROT 6.0 (L) 08/03/2023 0844   PROT 5.8 (L) 08/02/2023 0221   ALBUMIN  2.4 (L) 08/03/2023 0844   ALBUMIN  2.5 (L) 08/02/2023 0221    Studies/Results: DG CHEST PORT 1 VIEW Result Date: 08/03/2023 CLINICAL DATA:  PICC in place. EXAM: PORTABLE CHEST 1 VIEW COMPARISON:  3 hours prior FINDINGS: Tip of the right upper extremity PICC in the region of the atrial caval junction. Lower lung volumes from earlier today. Stable mediastinal contours, aortic atherosclerosis. Increasing atelectasis in the left lung base and right  infrahilar region. No pneumothorax or large pleural effusion. IMPRESSION: 1. Tip of the right upper extremity PICC in the region of the atrial caval junction. 2. Lower lung volumes with increasing atelectasis in the left lung base and right infrahilar region. Electronically Signed   By: Andrea Gasman M.D.   On: 08/03/2023 20:46   DG Chest Port 1 View Result Date: 08/03/2023 CLINICAL DATA:  Line placement EXAM: PORTABLE CHEST 1 VIEW COMPARISON:  X-ray 07/28/2023.  CT 07/28/2023. FINDINGS: Placement of a right-sided PICC with the tip along the upper right atrium. Stable cardiopericardial silhouette when adjusting for level of underinflation. Calcified aorta. Increasing bandlike  changes right perihilar in the left lung base, favoring atelectasis. No pneumothorax, effusion or edema. Overlapping cardiac leads. The left inferior costophrenic angle is clipped off the edge of the film. Degenerative changes along the spine. IMPRESSION: Right-sided PICC in place with tip along the upper right atrium. Decreased inflation with some increasing atelectasis. Electronically Signed   By: Ranell Bring M.D.   On: 08/03/2023 18:04   US  EKG SITE RITE Result Date: 08/03/2023 If Site Rite image not attached, placement could not be confirmed due to current cardiac rhythm.     [name] 08/05/2023  Patient ID: Francisco Padilla, male   DOB: 07-31-1950, 73 y.o.   MRN: [id number]

## 2023-08-05 NOTE — Assessment & Plan Note (Signed)
 No signs of acute decompensation Continue blood pressure monitoring.

## 2023-08-05 NOTE — Progress Notes (Signed)
 PHARMACY - ANTICOAGULATION Pharmacy Consult for Heparin  Indication: atrial fibrillation Brief A/P: Heparin  level subtherapeutic Increase Heparin  rate  Allergies  Allergen Reactions   Aspirin     Avoid due to bariatric surgery   Bupropion Other (See Comments)    Passed out   Nsaids     Avoid due to bariatric surgery   Oxycodone  Hcl     Hallucinations, agitation     Patient Measurements: Height: 6' 4 (193 cm) Weight: 98.5 kg (217 lb 2.5 oz) IBW/kg (Calculated) : 86.8 Heparin  Dosing Weight: 93 kg  Vital Signs: Temp: 98 F (36.7 C) (01/12 2016) Temp Source: Oral (01/12 2016) BP: 101/67 (01/12 2016) Pulse Rate: 90 (01/12 2016)  Labs: Recent Labs    08/03/23 0845 08/04/23 0321 08/04/23 1155 08/04/23 1429 08/04/23 1521 08/04/23 2100 08/05/23 0513 08/05/23 1526 08/05/23 2227  HGB 13.3 11.1*  --  10.7*  --   --  10.6*  --   --   HCT 39.8 32.7*  --  31.5*  --   --  31.5*  --   --   PLT 311 224  --  219  --   --  252  --   --   APTT 39*  --   --   --   --   --   --   --   --   HEPARINUNFRC 0.11* 0.11*   < >  --   --    < > 0.13* 0.12* 0.18*  CREATININE 0.89  --   --   --  0.67  --  0.57*  --   --    < > = values in this interval not displayed.    Estimated Creatinine Clearance: 104 mL/min (A) (by C-G formula based on SCr of 0.57 mg/dL (L)).  Assessment: 73 y.o. male with h/o Afib s/p cholecystectomy 1/8 for heparin   Goal of Therapy:  Heparin  level 0.3-0.7 units/mL Monitor platelets by anticoagulation protocol: Yes   Plan:  Increase Heparin  2700 units/hr Follow-up am labs.   Cathlyn Arrant, PharmD, BCPS

## 2023-08-05 NOTE — Progress Notes (Signed)
   08/05/23 0841  Assess: MEWS Score  Temp 97.6 F (36.4 C)  BP (!) 85/60  O2 Device Room Air  Assess: MEWS Score  MEWS Temp 0  MEWS Systolic 1  MEWS Pulse 0  MEWS RR 1  MEWS LOC 0  MEWS Score 2  MEWS Score Color Yellow  Assess: if the MEWS score is Yellow or Red  Were vital signs accurate and taken at a resting state? Yes (will recheck BP again)  Does the patient meet 2 or more of the SIRS criteria? No  Assess: SIRS CRITERIA  SIRS Temperature  0  SIRS Respirations  1  SIRS Pulse 0  SIRS WBC 1  SIRS Score Sum  2

## 2023-08-05 NOTE — Progress Notes (Signed)
 PHARMACY - ANTICOAGULATION Pharmacy Consult for Heparin  Indication: atrial fibrillation  Allergies  Allergen Reactions   Aspirin     Avoid due to bariatric surgery   Bupropion Other (See Comments)    Passed out   Nsaids     Avoid due to bariatric surgery   Oxycodone  Hcl     Hallucinations, agitation     Patient Measurements: Height: 6' 4 (193 cm) Weight: 98.5 kg (217 lb 2.5 oz) IBW/kg (Calculated) : 86.8 Heparin  Dosing Weight: 93 kg  Vital Signs: Temp: 97.7 F (36.5 C) (01/12 1626) Temp Source: Oral (01/12 1626) BP: 113/81 (01/12 1626) Pulse Rate: 72 (01/12 1626)  Labs: Recent Labs    08/02/23 1955 08/03/23 0845 08/04/23 0321 08/04/23 1155 08/04/23 1429 08/04/23 1521 08/04/23 2100 08/05/23 0513 08/05/23 1526  HGB 12.5* 13.3 11.1*  --  10.7*  --   --  10.6*  --   HCT 37.3* 39.8 32.7*  --  31.5*  --   --  31.5*  --   PLT 216 311 224  --  219  --   --  252  --   APTT 38* 39*  --   --   --   --   --   --   --   HEPARINUNFRC 0.12* 0.11* 0.11*   < >  --   --  0.14* 0.13* 0.12*  CREATININE  --  0.89  --   --   --  0.67  --  0.57*  --    < > = values in this interval not displayed.    Estimated Creatinine Clearance: 104 mL/min (A) (by C-G formula based on SCr of 0.57 mg/dL (L)).  Assessment: 73 y.o. male with h/o Afib s/p cholecystectomy 1/8.  Pt continues on heparin  pending plans / need for ERCP.    Heparin  remains subtherapeutic despite multiple rate increases.  Spoke with RN who switched heparin  infusion to PICC line to ensure patient receiving adequate drug.  Will not increase rate at this time and repeat level.  Will also order AT3 with next lab draw to evaluate need to switch agents.  Goal of Therapy:  Heparin  level 0.3-0.7 units/mL Monitor platelets by anticoagulation protocol: Yes   Plan:  Continue Heparin  at 2400 units/hr through PICC Check heparin  level in 6 hours - will need to be a peripheral stick. Check AT3 with next lab draw.   [place], Pharm.D., BCPS Clinical Pharmacist  **Pharmacist phone directory can be found on [email or web address] listed under [facility].  08/05/2023 5:56 PM

## 2023-08-05 NOTE — Assessment & Plan Note (Signed)
 Continue with nutritional supplements.

## 2023-08-05 NOTE — Progress Notes (Signed)
   08/05/23 0900  Assess: MEWS Score  BP 92/62  Pulse Rate 92  ECG Heart Rate (!) 101  Resp 16  Level of Consciousness Alert  SpO2 97 %  Assess: MEWS Score  MEWS Temp 0  MEWS Systolic 1  MEWS Pulse 1  MEWS RR 0  MEWS LOC 0  MEWS Score 2  MEWS Score Color Yellow  Assess: if the MEWS score is Yellow or Red  Were vital signs accurate and taken at a resting state? Yes (will recheck BP again)  Does the patient meet 2 or more of the SIRS criteria? No  Assess: SIRS CRITERIA  SIRS Temperature  0  SIRS Respirations  0  SIRS Pulse 1  SIRS WBC 1  SIRS Score Sum  2

## 2023-08-06 DIAGNOSIS — N179 Acute kidney failure, unspecified: Secondary | ICD-10-CM | POA: Diagnosis not present

## 2023-08-06 DIAGNOSIS — Z9884 Bariatric surgery status: Secondary | ICD-10-CM

## 2023-08-06 DIAGNOSIS — I4821 Permanent atrial fibrillation: Secondary | ICD-10-CM | POA: Diagnosis not present

## 2023-08-06 DIAGNOSIS — I350 Nonrheumatic aortic (valve) stenosis: Secondary | ICD-10-CM | POA: Diagnosis not present

## 2023-08-06 DIAGNOSIS — K810 Acute cholecystitis: Secondary | ICD-10-CM | POA: Diagnosis not present

## 2023-08-06 DIAGNOSIS — R6510 Systemic inflammatory response syndrome (SIRS) of non-infectious origin without acute organ dysfunction: Secondary | ICD-10-CM

## 2023-08-06 DIAGNOSIS — I4891 Unspecified atrial fibrillation: Secondary | ICD-10-CM | POA: Diagnosis not present

## 2023-08-06 LAB — BASIC METABOLIC PANEL
Anion gap: 8 (ref 5–15)
BUN: 18 mg/dL (ref 8–23)
CO2: 28 mmol/L (ref 22–32)
Calcium: 8.6 mg/dL — ABNORMAL LOW (ref 8.9–10.3)
Chloride: 97 mmol/L — ABNORMAL LOW (ref 98–111)
Creatinine, Ser: 0.69 mg/dL (ref 0.61–1.24)
GFR, Estimated: >60 mL/min (ref >60)
Glucose, Bld: 127 mg/dL — ABNORMAL HIGH (ref 70–99)
Potassium: 3.5 mmol/L (ref 3.5–5.1)
Sodium: 133 mmol/L — ABNORMAL LOW (ref 135–145)

## 2023-08-06 LAB — CBC
HCT: 32.6 % — ABNORMAL LOW (ref 39.0–52.0)
Hemoglobin: 11 g/dL — ABNORMAL LOW (ref 13.0–17.0)
MCH: 30.6 pg (ref 26.0–34.0)
MCHC: 33.7 g/dL (ref 30.0–36.0)
MCV: 90.8 fL (ref 80.0–100.0)
Platelets: 288 10*3/uL (ref 150–400)
RBC: 3.59 MIL/uL — ABNORMAL LOW (ref 4.22–5.81)
RDW: 12.6 % (ref 11.5–15.5)
WBC: 20.5 10*3/uL — ABNORMAL HIGH (ref 4.0–10.5)
nRBC: 0 % (ref 0.0–0.2)

## 2023-08-06 LAB — HEPARIN LEVEL (UNFRACTIONATED)
Heparin Unfractionated: 0.34 [IU]/mL (ref 0.30–0.70)
Heparin Unfractionated: 0.3 [IU]/mL (ref 0.30–0.70)

## 2023-08-06 LAB — ANTITHROMBIN III: AntiThromb III Func: 85 % (ref 75–120)

## 2023-08-06 MED ORDER — ZOLPIDEM TARTRATE 5 MG PO TABS
5.0000 mg | ORAL_TABLET | Freq: Once | ORAL | Status: AC
  Administered 2023-08-06: 5 mg via ORAL
  Filled 2023-08-06: qty 1

## 2023-08-06 MED ORDER — APIXABAN 5 MG PO TABS
5.0000 mg | ORAL_TABLET | Freq: Two times a day (BID) | ORAL | Status: DC
  Administered 2023-08-06 – 2023-08-07 (×2): 5 mg via ORAL
  Filled 2023-08-06 (×2): qty 1

## 2023-08-06 NOTE — Plan of Care (Signed)

## 2023-08-06 NOTE — Progress Notes (Signed)
 PHARMACY - ANTICOAGULATION Pharmacy Consult for Heparin  Indication: atrial fibrillation  Allergies  Allergen Reactions   Aspirin     Avoid due to bariatric surgery   Bupropion Other (See Comments)    Passed out   Nsaids     Avoid due to bariatric surgery   Oxycodone  Hcl     Hallucinations, agitation     Patient Measurements: Height: 6' 4 (193 cm) Weight: 93 kg (205 lb 0.4 oz) IBW/kg (Calculated) : 86.8 Heparin  Dosing Weight: 93 kg  Vital Signs: Temp: 97.7 F (36.5 C) (01/13 0747) Temp Source: Oral (01/13 0747) BP: 121/95 (01/13 0747) Pulse Rate: 75 (01/13 0747)  Labs: Recent Labs    08/04/23 1429 08/04/23 1521 08/04/23 2100 08/05/23 0513 08/05/23 1526 08/05/23 2227 08/06/23 0800 08/06/23 0845  HGB 10.7*  --   --  10.6*  --   --   --  11.0*  HCT 31.5*  --   --  31.5*  --   --   --  32.6*  PLT 219  --   --  252  --   --   --  288  HEPARINUNFRC  --   --    < > 0.13* 0.12* 0.18* 0.34  --   CREATININE  --  0.67  --  0.57*  --   --   --   --    < > = values in this interval not displayed.    Estimated Creatinine Clearance: 104 mL/min (A) (by C-G formula based on SCr of 0.57 mg/dL (L)).  Assessment: 73 y.o. male . Patine wason Eliquis  pta for PAF. Last dose given 1/3. Transitioned to IV Heparin  which was stopped 1/8 AM for cholecystectomy. Held throughout post-operative day 0 (1/9) due to bleeding loss during surgery. Pharmacy consulted to resume IV Heparin   on 08/03/23 AM - no bolus. Pt continues on heparin  pending plans / need for ERCP.   s/p cholecystectomy 08/01/23  08/06/23:  heparin  level = 0.34 after heparin  rate increased to 2700 units/hr. This HL therapeutic on IV heparin  current rate. 1/12 AT3 = 85 normal.  Hb 10s-11, Plt 200s stable.  No further bleeding reported.    Goal of Therapy:  Heparin  level 0.3-0.7 units/mL Monitor platelets by anticoagulation protocol: Yes   Plan:  Continue Heparin  2700 units/hr Follow-up confirmatory HL in 6 hours  at  14:00 today Then continue daily HL and CBC while on IV heparin   Thank you for allowing pharmacy to be part of this patients care team.  Levorn Gaskins, RPh Clinical Pharmacist  08/06/2023 9:20 AM

## 2023-08-06 NOTE — Consult Note (Signed)
 [facility] Liaison Consult Note   08/06/2023  Francisco Padilla Dec 22, 1950 [id number]  Insurance: Medicare ACO REACH   Primary Care Provider: Sherlynn Madden, MD, with [facility], this provider is listed for the transition of care follow up appointments  and Transition of care calls   [facility] Liaison met patient at bedside at [facility]. Came to room and patient is working with staff on rounds, currently out of bed with bedside staff.  Will continue to follow    The patient was screened for LLOS 8 days which included ICU stay hospitalization with noted low risk score for unplanned readmission risk 1 hospital admissions in 6 months.  The patient was assessed for potential Community Care Coordination service needs for post hospital transition for care coordination. Review of patient's electronic medical record reveals patient is currently being recommended for a skilled nursing facility for rehab level of care for post hospital transition..   Plan: [facility] Liaison will continue to follow progress and disposition to asess for post hospital community care coordination/management needs.  Referral request for community care coordination: continue to follow up, if SNF then can alert [facility] RN for post facility needs.   [facility]BCI Community Care, Population Health does not replace or interfere with any arrangements made by the Inpatient Transition of Care team.   For questions contact:   Richerd Fish, RN, BSN, CCM [place]  [facility], [facility] Liaison Direct Dial: [phone] or secure chat Email: [email or web address]

## 2023-08-06 NOTE — Progress Notes (Signed)
 5 Days Post-Op  Subjective: Patient with complaints of achiness in abdomen, but otherwise eating well.  Drain output has gone down dramatically  ROS: See above, otherwise other systems negative  Objective: Vital signs in last 24 hours: Temp:  [97.7 F (36.5 C)-98 F (36.7 C)] 97.7 F (36.5 C) (01/13 0747) Pulse Rate:  [66-92] 75 (01/13 0747) Resp:  [13-20] 17 (01/13 0747) BP: (92-135)/(62-98) 121/95 (01/13 0747) SpO2:  [97 %-99 %] 98 % (01/13 0747) FiO2 (%):  [21 %] 21 % (01/13 0155) Weight:  [93 kg] 93 kg (01/13 0701) Last BM Date : 07/31/23  Intake/Output from previous day: 01/12 0701 - 01/13 0700 In: 400 [P.O.:350; IV Piggyback:50] Out: 3000 [Urine:2990; Drains:10] Intake/Output this shift: No intake/output data recorded.  PE: Gen: NAD Abd: soft, minimally tender appropriately post op, ND, incisions c/d/I, JP with minimal output, tan cloudy today, no bile present in drain  Lab Results:  Recent Labs    08/04/23 1429 08/05/23 0513  WBC 24.7* 24.7*  HGB 10.7* 10.6*  HCT 31.5* 31.5*  PLT 219 252   BMET Recent Labs    08/04/23 1521 08/05/23 0513  NA 134* 132*  K 4.2 4.1  CL 101 99  CO2 25 25  GLUCOSE 178* 141*  BUN 22 20  CREATININE 0.67 0.57*  CALCIUM  8.8* 8.7*   PT/INR No results for input(s): LABPROT, INR in the last 72 hours. CMP     Component Value Date/Time   NA 132 (L) 08/05/2023 0513   K 4.1 08/05/2023 0513   CL 99 08/05/2023 0513   CO2 25 08/05/2023 0513   GLUCOSE 141 (H) 08/05/2023 0513   BUN 20 08/05/2023 0513   CREATININE 0.57 (L) 08/05/2023 0513   CREATININE 0.77 07/19/2023 1128   CALCIUM  8.7 (L) 08/05/2023 0513   PROT 6.0 (L) 08/03/2023 0844   ALBUMIN  2.4 (L) 08/03/2023 0844   AST 29 08/03/2023 0844   ALT 35 08/03/2023 0844   ALKPHOS 79 08/03/2023 0844   BILITOT 1.1 08/03/2023 0844   GFRNONAA >60 08/05/2023 0513   GFRAA  10/23/2009 0135    >60        The eGFR has been calculated using the MDRD equation. This  calculation has not been validated in all clinical situations. eGFR's persistently <60 mL/min signify possible Chronic Kidney Disease.   Lipase     Component Value Date/Time   LIPASE 29 07/29/2023 0014       Studies/Results: No results found.   Anti-infectives: Anti-infectives (From admission, onward)    Start     Dose/Rate Route Frequency Ordered Stop   07/29/23 0800  piperacillin -tazobactam (ZOSYN ) IVPB 3.375 g        3.375 g 12.5 mL/hr over 240 Minutes Intravenous Every 8 hours 07/29/23 0207 08/06/23 2359   07/29/23 0030  piperacillin -tazobactam (ZOSYN ) IVPB 3.375 g        3.375 g 100 mL/hr over 30 Minutes Intravenous  Once 07/29/23 0020 07/29/23 0147        Assessment/Plan POD 5, s/p subtotal cholecystectomy for gangrenous cholecystitis, Dr. Polly 1/8 -cont regular diet -cont abx therapy.  WBC 24K yesterday, labs just drawn while I was in the room.  Await these results -last LFTs were normal -drain output is minimal and is no longer bilious.  It has actually changed today and appears tan, cloudy/purulent -unlikely to need ERCP at this time given no further bile and output from drain is so low -patient still wants to tx to [facility] for  a second opinion.  If he is able to get a bed at [facility], it is more than ok with our team for his to be able to get this opinion.  He asked that we put him in an ambulance and drop him off in the [facility] ED.  I did inform him that this is an EMTALA violation and that is not something that can be done.  He stated he would discuss this with Dr. Arrien.      FEN - regular VTE - Eliquis  on hold, on hep gtt ID - Zosyn , cont at this time given WBC still elevated and contents in drain appear purulent in nature.  A fib AKI Peripheral neuropathy GERD HLD Hx of gastric bypass   LOS: 8 days    Burnard [name] , [facility] Surgery 08/06/2023, 8:46 AM Please see Amion for pager number during day hours 7:00am-4:30pm or 7:00am  -11:30am on weekends

## 2023-08-06 NOTE — Progress Notes (Signed)
 Rounding Note    Patient Name: Francisco Padilla Date of Encounter: 08/06/2023  [facility] Cardiologist: Oneil Parchment, MD   Subjective   No CP or dyspnea; mild abdominal pain  Inpatient Medications    Scheduled Meds:  acetaminophen   650 mg Oral Q4H   Or   acetaminophen   650 mg Rectal Q4H   acidophilus  1 capsule Oral Daily   alfuzosin   10 mg Oral Q breakfast   calcium  carbonate  200 mg of elemental calcium  Oral TID   Chlorhexidine  Gluconate Cloth  6 each Topical Daily   docusate sodium   100 mg Oral BID   feeding supplement  237 mL Oral BID BM   fludrocortisone   0.1 mg Oral Daily   gabapentin   600 mg Oral QHS   hydrocortisone  sod succinate (SOLU-CORTEF ) inj  100 mg Intravenous Q8H   latanoprost   1 drop Both Eyes QHS   midodrine   5 mg Oral TID WC   multivitamin with minerals  1 tablet Oral BID   pantoprazole   40 mg Oral Daily   polyethylene glycol  17 g Oral Daily   sodium chloride  flush  3 mL Intravenous Q12H   venlafaxine  XR  37.5 mg Oral Q breakfast   Continuous Infusions:  heparin  2,700 Units/hr (08/06/23 0112)   piperacillin -tazobactam (ZOSYN )  IV 3.375 g (08/06/23 0813)   PRN Meds: HYDROmorphone  (DILAUDID ) injection, LORazepam , senna-docusate, simethicone , zolpidem    Vital Signs    Vitals:   08/06/23 0140 08/06/23 0544 08/06/23 0701 08/06/23 0747  BP: (!) 135/98 123/88  (!) 121/95  Pulse: 78 66  75  Resp: 18 13  17   Temp: 98 F (36.7 C) 97.7 F (36.5 C)  97.7 F (36.5 C)  TempSrc: Oral Oral  Oral  SpO2: 97% 98%  98%  Weight:   93 kg   Height:        Intake/Output Summary (Last 24 hours) at 08/06/2023 0828 Last data filed at 08/06/2023 0559 Gross per 24 hour  Intake 400 ml  Output 3000 ml  Net -2600 ml      08/06/2023    7:01 AM 08/05/2023    4:20 AM 08/04/2023    9:23 PM  Last 3 Weights  Weight (lbs) 205 lb 0.4 oz 217 lb 2.5 oz 217 lb 9.5 oz  Weight (kg) 93 kg 98.5 kg 98.7 kg      Telemetry    Atrial fibrillation rate controlled -  Personally Reviewed   Physical Exam   GEN: No acute distress.   Neck: No JVD Cardiac: irregular, 2/6 systolic murmur Respiratory: Clear to auscultation bilaterally. GI: Soft, nontender, non-distended  MS: No edema Neuro:  Nonfocal  Psych: Normal affect   Labs     Chemistry Recent Labs  Lab 08/01/23 2009 08/02/23 0221 08/03/23 0844 08/03/23 0845 08/04/23 1521 08/05/23 0513  NA 135 133*  --  131* 134* 132*  K 4.0 4.8  --  3.9 4.2 4.1  CL 104 101  --  98 101 99  CO2 20* 19*  --  25 25 25   GLUCOSE 146* 154*  --  148* 178* 141*  BUN 19 20  --  22 22 20   CREATININE 0.75 0.81  --  0.89 0.67 0.57*  CALCIUM  8.8* 8.8*  --  8.9 8.8* 8.7*  MG 1.7 1.9  --  2.0  --   --   PROT 5.5* 5.8* 6.0*  --   --   --   ALBUMIN  2.5* 2.5* 2.4*  --   --   --  AST 34 32 29  --   --   --   ALT 46* 43 35  --   --   --   ALKPHOS 73 69 79  --   --   --   BILITOT 1.3* 1.5* 1.1  --   --   --   GFRNONAA >60 >60  --  >60 >60 >60  ANIONGAP 11 13  --  8 8 8      Hematology Recent Labs  Lab 08/04/23 0321 08/04/23 1429 08/05/23 0513  WBC 27.0* 24.7* 24.7*  RBC 3.60* 3.44* 3.45*  HGB 11.1* 10.7* 10.6*  HCT 32.7* 31.5* 31.5*  MCV 90.8 91.6 91.3  MCH 30.8 31.1 30.7  MCHC 33.9 34.0 33.7  RDW 12.8 12.8 12.8  PLT 224 219 252     Patient Profile     73 y.o. male with past medical history of permanent atrial fibrillation, severe aortic stenosis, chronic hypotension admitted with acute cholecystitis.  Echocardiogram this admission shows normal LV function, severe low-flow low gradient aortic stenosis (mean gradient 20 mmHg, aortic valve area 0.96 cm, dimensionless index 0.21), mild right ventricular enlargement and mild RV dysfunction, biatrial enlargement.  Patient underwent laparoscopic subtotal cholecystectomy on August 01, 2023.    Assessment & Plan    1 permanent atrial fibrillation-patient's heart rate is controlled on no medications.  Continue IV heparin .  Transition back to apixaban  once  all procedures complete.  2 chronic hypotension-continue Florinef  and midodrine .  Blood pressure is stable.  3 status post cholecystectomy-management per general surgery.  Transfer to [name] with possible stent placement is being considered.  4 aortic stenosis-possible low-flow low gradient severe aortic stenosis noted on echocardiogram.  Will follow-up in the office for this issue after discharge.  For questions or updates, please contact [facility] Please consult [email or web address] for contact info under        Signed, Redell Shallow, MD  08/06/2023, 8:28 AM

## 2023-08-06 NOTE — Assessment & Plan Note (Signed)
Home Cpap.

## 2023-08-06 NOTE — TOC Progression Note (Signed)
 Transition of Care [facility]) - Progression Note    Patient Details  Name: Francisco Padilla MRN: [id number] Date of Birth: 1950-09-29  [facility]) CM/SW Contact  [name], [place] Phone Number: 08/06/2023, 4:22 PM  Clinical Narrative:   TOC to follow up w/ [place] regarding SNF referral before pts procedure.   TOC will continue to follow.     Expected Discharge Plan: Home w Home Health Services Barriers to Discharge: Continued Medical Work up  Expected Discharge Plan and Services In-house Referral: NA Discharge Planning Services: CM Consult Post Acute Care Choice: NA Living arrangements for the past 2 months: Single Family Home                 DME Arranged: N/A DME Agency: NA       HH Arranged: NA           Social Determinants of Health (SDOH) Interventions SDOH Screenings   Food Insecurity: No Food Insecurity (07/29/2023)  Housing: Low Risk  (07/29/2023)  Transportation Needs: No Transportation Needs (07/29/2023)  Utilities: Not At Risk (07/29/2023)  Social Connections: Patient Declined (07/31/2023)  Tobacco Use: Low Risk  (08/01/2023)    Readmission Risk Interventions     No data to display

## 2023-08-06 NOTE — Progress Notes (Signed)
 Progress Note   Patient: Francisco Padilla FMW:[id number] DOB: Sep 26, 1950 DOA: 07/28/2023     8 DOS: the patient was seen and examined on 08/06/2023   Brief hospital course: Patient is a 73 year old male with pertinent PMH OSA on CPAP, HLD, chronic hypotension, PAF on Eliquis , PVD presents to [facility] ED on 1/5 with cholecystitis.   Patient complaining of weakness/fatigue going on for about a month.  Patient also having nausea, abdominal pain, diarrhea.  Abdominal pain worse in right upper quadrant.  Denies fever/chills.  Came to [facility] ED on 1/5 for further workup.  On arrival patient noted to be slightly tachycardic 100s and mildly hypotensive 93/73.  CT ABD/pelvis showing acute cholecystitis.  Surgery consulted.  Given IV fluids and Zosyn .  Awaiting surgery 48 hours for Eliquis  washout. Cards consulted for preoperative evaluation of cholecystectomy. Echo on 1/7 showing lvef 60-65%; severe aortic stenosis.  On 1/8 patient taken to the OR for cholecystectomy.  While in the OR patient had tachycardia 150s and started on esmolol .  Patient later became hypotensive and started on neo.  Postop patient taken to ICU while on pressors.  PCCM consulted.  1/5 admitted with cholecystitis 1/8 s/p cholecystectomy; given esmolol  for HR 150s then became hypotensive on neo-; postop taken to ICU and PCCM consulted 1/10 episode of rapid A-fib with RVR in the 150s, systolic pressure in the 70s.  01/13 no clinical signs of bile leak, and no need for ERCP.   Assessment and Plan: * Acute cholecystitis Gangrenous cholecystitis, complicated with sepsis.  Sp cholecystectomy, sepsis has resolved.   Completed antibiotic therapy with Zosyn  Clinically no signs of bile leak.    Continue with solu cortef   and midodrine  for chronic hypotension. Will discontinue stress dose steroids and continue blood pressure monitoring.    AKI (acute kidney injury) (HCC) Hyperkalemia and hyponatremia.   Stable renal function with serum cr at 0,69  with K at 3,5 and serum bicarbonate at 28  Na 133   Patient tolerating po well.   Aortic stenosis, severe No signs of acute decompensation Continue blood pressure monitoring.   Atrial fibrillation with RVR (HCC) Improved rate control Transition to apixaban  for anticoagulation,   Protein-calorie malnutrition, severe Continue with nutritional supplements.   History of Roux-en-Y gastric bypass Follow up as outpatient.   OSA on CPAP Home Cpap.         Subjective: Patient with no chest pain or dyspnea, no palpitations, mild right upper quadrant abdominal pain improved with analgesics   Physical Exam: Vitals:   08/06/23 0701 08/06/23 0747 08/06/23 1041 08/06/23 1522  BP:  (!) 121/95 (!) 84/58 103/72  Pulse:  75 93 87  Resp:  17 19 14   Temp:  97.7 F (36.5 C) 97.6 F (36.4 C) 97.6 F (36.4 C)  TempSrc:  Oral Oral Oral  SpO2:  98% 98% 100%  Weight: 93 kg     Height:       Neurology awake and alert ENT with mild pallor Cardiovascular with S1 and S2 present, irregularly irregular with no gallops, rubs or murmurs No jvd No lower extremity edema Respiratory with no rales or wheezing, no rhonchi Abdomen with drain in place with no distention or tenderness to superficial palpation  Data Reviewed:    Family Communication: no family at the bedside   Disposition: Status is: Inpatient Remains inpatient appropriate because:drain monitoring.  Planned Discharge Destination: Home      Author: Elidia Toribio Furnace, MD 08/06/2023 4:47 PM  For on call review  [email or web address]

## 2023-08-06 NOTE — Progress Notes (Signed)
 Mobility Specialist Progress Note:   08/06/23 1553  Mobility  Activity Stood at bedside (x5 steps of marches)  Level of Assistance Moderate assist, patient does 50-74%  Assistive Device Front wheel walker  Activity Response Tolerated well  Mobility Referral Yes  Mobility visit 1 Mobility  Mobility Specialist Start Time (ACUTE ONLY) 1405  Mobility Specialist Stop Time (ACUTE ONLY) 1420  Mobility Specialist Time Calculation (min) (ACUTE ONLY) 15 min   Pt received in chair eager for mobility. Pt attempted to stand by the chair before impulsively sitting back down stating that he was dizzy BP was 82/59. After a seated rest break pt was able to stand back up BP 86/62. Pt perform x5 marches. Returned to seated in chair d/t getting dizzy again. BP 91/71. Left in chair w/ call bell and personal belongings in reach. All needs met. Chair alarm on. Last BP was 102/76, RN aware.    Pre Mobility BP 102/69 During Mobility BP 82/59                          BP 86/62 Post Mobility BP 102/76  Thersia Minder Mobility Specialist  Please contact vis Secure Chat or  Rehab Office [phone]

## 2023-08-06 NOTE — Progress Notes (Signed)
 Physical Therapy Treatment Patient Details Name: Francisco Padilla MRN: [id number] DOB: September 26, 1950 Today's Date: 08/06/2023   History of Present Illness Pt is a 73 y/o male admitted for sepsis due to acute cholecystitis and AKI. Gangrenous cholecystitis s/p lap subtotal cholecystectomy 1/8.  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 Pt was able to ambulate with RW and incr distance in hallway.  Pt did have some drops in BP and incr HR that was monitored by PT. Did follow pt with chair however pt didn't need to sit during treatment.  Did have to instruct pt to head back into room when HR up to 151 bpm as pt would have kept walking.   Pt currently with functional limitations due to the deficits listed below (see PT Problem List). Pt will benefit from acute skilled PT to increase their independence and safety with mobility to allow discharge.     Resting VS 92 bpm, 97%RA, 83/50  Sitting 101/69, 98 bpm Standing 106/81 102 bpm Standing after 3 min 90/59, 105 bpm asymptomatic Walking 85/69, 124 bpm- 151 bpm At end of gait, 87/63, 130 bpm   If plan is discharge home, recommend the following: Assistance with cooking/housework;Direct supervision/assist for medications management;Direct supervision/assist for financial management;Assist for transportation;Supervision due to cognitive status;Help with stairs or ramp for entrance;A lot of help with walking and/or transfers;A lot of help with bathing/dressing/bathroom   Can travel by private vehicle     No  Equipment Recommendations  Rolling walker (2 wheels);BSC/3in1    Recommendations for Other Services       Precautions / Restrictions Precautions Precautions: Fall Precaution Comments: JP drain RUQ. Montior HR and BP Restrictions Weight Bearing Restrictions Per Provider Order: No     Mobility  Bed Mobility Overal bed mobility: Needs  Assistance Bed Mobility: Supine to Sit     Supine to sit: HOB elevated, Used rails, Contact guard     General bed mobility comments: EOB was Slow and effortful, VC for technique. Trouble sequencing.    Transfers Overall transfer level: Needs assistance Equipment used: Rolling walker (2 wheels) Transfers: Sit to/from Stand Sit to Stand: Min assist, +2 safety/equipment, From elevated surface           General transfer comment: Min assisst to rise with posterior lean initially and took incr time to get his balance.  RW for support.    Ambulation/Gait Ambulation/Gait assistance: +2 safety/equipment, Min assist Gait Distance (Feet): 525 Feet Assistive device: Rolling walker (2 wheels) Gait Pattern/deviations: Step-to pattern, Trunk flexed, Leaning posteriorly, Shuffle, Decreased stride length, Knees buckling, Wide base of support Gait velocity: reduced Gait velocity interpretation: <1.31 ft/sec, indicative of household ambulator   General Gait Details: Min assist for balance at times and to progress RW forward. Pt missteps at times and tells PT it is because of his weight loss as well as his neuropathy that he gets his feet crossed up at times. Pt also slightly dizzy at times therefore followed pt with chair.   Stairs             Wheelchair Mobility     Tilt Bed    Modified Rankin (Stroke Patients Only)       Balance Overall balance assessment: Needs assistance Sitting-balance support: Feet supported, Single extremity supported, No upper extremity supported Sitting balance-Leahy Scale: Fair     Standing balance support: Reliant on assistive device for balance,  Bilateral upper extremity supported, During functional activity Standing balance-Leahy Scale: Poor Standing balance comment: Stand with RW for support, posterior instability.                            Cognition Arousal: Alert Behavior During Therapy: WFL for tasks  assessed/performed Overall Cognitive Status: No family/caregiver present to determine baseline cognitive functioning Area of Impairment: Memory, Problem solving                     Memory: Decreased short-term memory       Problem Solving: Slow processing General Comments: Oriented but with effort. Pt mixing up stories and tangential.        Exercises General Exercises - Lower Extremity Ankle Circles/Pumps: AROM, Both, 10 reps, Seated Long Arc Quad: AROM, Both, 10 reps, Seated Hip Flexion/Marching: AROM, Both, 10 reps, Seated    General Comments        Pertinent Vitals/Pain Pain Assessment Pain Assessment: No/denies pain    Home Living                          Prior Function            PT Goals (current goals can now be found in the care plan section) Progress towards PT goals: Progressing toward goals    Frequency    Min 1X/week      PT Plan      Co-evaluation              AM-PAC PT 6 Clicks Mobility   Outcome Measure  Help needed turning from your back to your side while in a flat bed without using bedrails?: A Little Help needed moving from lying on your back to sitting on the side of a flat bed without using bedrails?: A Little Help needed moving to and from a bed to a chair (including a wheelchair)?: A Lot Help needed standing up from a chair using your arms (e.g., wheelchair or bedside chair)?: A Lot Help needed to walk in hospital room?: A Lot Help needed climbing 3-5 steps with a railing? : Total 6 Click Score: 13    End of Session Equipment Utilized During Treatment: Gait belt Activity Tolerance: Patient tolerated treatment well Patient left: with call bell/phone within reach;in chair;with chair alarm set Nurse Communication: Mobility status;Other (comment) (BP) PT Visit Diagnosis: Other abnormalities of gait and mobility (R26.89);Muscle weakness (generalized) (M62.81);Unsteadiness on feet (R26.81);Difficulty in  walking, not elsewhere classified (R26.2);Dizziness and giddiness (R42)     Time: 8892-8851 PT Time Calculation (min) (ACUTE ONLY): 41 min  Charges:    $Gait Training: 23-37 mins $Therapeutic Exercise: 8-22 mins PT General Charges $$ ACUTE PT VISIT: 1 Visit                     Francisco Padilla,PT Acute Rehab Services [phone]    Francisco Padilla 08/06/2023, 1:30 PM

## 2023-08-06 NOTE — Assessment & Plan Note (Signed)
 Follow-up as outpatient

## 2023-08-06 NOTE — Plan of Care (Signed)
  Problem: Clinical Measurements: Goal: Ability to maintain clinical measurements within normal limits will improve Outcome: Progressing   Problem: Activity: Goal: Risk for activity intolerance will decrease Outcome: Progressing   Problem: Safety: Goal: Ability to remain free from injury will improve Outcome: Progressing   

## 2023-08-07 LAB — APTT: aPTT: 31 s (ref 24–36)

## 2023-08-07 LAB — CBC
HCT: 35 % — ABNORMAL LOW (ref 39.0–52.0)
Hemoglobin: 11.6 g/dL — ABNORMAL LOW (ref 13.0–17.0)
MCH: 30.8 pg (ref 26.0–34.0)
MCHC: 33.1 g/dL (ref 30.0–36.0)
MCV: 92.8 fL (ref 80.0–100.0)
Platelets: 311 10*3/uL (ref 150–400)
RBC: 3.77 MIL/uL — ABNORMAL LOW (ref 4.22–5.81)
RDW: 12.9 % (ref 11.5–15.5)
WBC: 17 10*3/uL — ABNORMAL HIGH (ref 4.0–10.5)
nRBC: 0 % (ref 0.0–0.2)

## 2023-08-07 LAB — CBC WITH DIFFERENTIAL/PLATELET
Abs Immature Granulocytes: 0.16 10*3/uL — ABNORMAL HIGH (ref 0.00–0.07)
Basophils Absolute: 0 10*3/uL (ref 0.0–0.1)
Basophils Relative: 0 %
Eosinophils Absolute: 0 10*3/uL (ref 0.0–0.5)
Eosinophils Relative: 0 %
HCT: 33 % — ABNORMAL LOW (ref 39.0–52.0)
Hemoglobin: 11 g/dL — ABNORMAL LOW (ref 13.0–17.0)
Immature Granulocytes: 1 %
Lymphocytes Relative: 4 %
Lymphs Abs: 0.7 10*3/uL (ref 0.7–4.0)
MCH: 30.8 pg (ref 26.0–34.0)
MCHC: 33.3 g/dL (ref 30.0–36.0)
MCV: 92.4 fL (ref 80.0–100.0)
Monocytes Absolute: 0.9 10*3/uL (ref 0.1–1.0)
Monocytes Relative: 5 %
Neutro Abs: 15.5 10*3/uL — ABNORMAL HIGH (ref 1.7–7.7)
Neutrophils Relative %: 90 %
Platelets: 309 10*3/uL (ref 150–400)
RBC: 3.57 MIL/uL — ABNORMAL LOW (ref 4.22–5.81)
RDW: 12.7 % (ref 11.5–15.5)
WBC: 17.3 10*3/uL — ABNORMAL HIGH (ref 4.0–10.5)
nRBC: 0 % (ref 0.0–0.2)

## 2023-08-07 LAB — HEPARIN LEVEL (UNFRACTIONATED)
Heparin Unfractionated: 0.52 [IU]/mL (ref 0.30–0.70)
Heparin Unfractionated: 0.96 [IU]/mL — ABNORMAL HIGH (ref 0.30–0.70)

## 2023-08-07 MED ORDER — ACETAMINOPHEN 325 MG PO TABS: 650.0000 mg | ORAL_TABLET | ORAL | Status: AC

## 2023-08-07 MED ORDER — LATANOPROST 0.005 % OP SOLN: 1.0000 [drp] | Freq: Every day | OPHTHALMIC | Status: AC

## 2023-08-07 MED ORDER — MIDODRINE HCL 5 MG PO TABS: 5.0000 mg | ORAL_TABLET | Freq: Three times a day (TID) | ORAL | Status: AC

## 2023-08-07 MED ORDER — SENNOSIDES-DOCUSATE SODIUM 8.6-50 MG PO TABS: 1.0000 | ORAL_TABLET | Freq: Every evening | ORAL | Status: DC | PRN

## 2023-08-07 MED ORDER — VENLAFAXINE HCL ER 37.5 MG PO CP24: 37.5000 mg | ORAL_CAPSULE | Freq: Every day | ORAL | Status: DC

## 2023-08-07 MED ORDER — CALCIUM CARBONATE ANTACID 500 MG PO CHEW: 200.0000 mg | CHEWABLE_TABLET | Freq: Three times a day (TID) | ORAL | Status: DC

## 2023-08-07 MED ORDER — PIPERACILLIN-TAZOBACTAM 3.375 G IVPB: 3.3750 g | Freq: Three times a day (TID) | INTRAVENOUS | Status: DC

## 2023-08-07 MED ORDER — HEPARIN (PORCINE) 25000 UT/250ML-% IV SOLN
2700.0000 [IU]/h | INTRAVENOUS | Status: DC
  Administered 2023-08-07 – 2023-08-08 (×3): 2700 [IU]/h via INTRAVENOUS
  Filled 2023-08-07 (×4): qty 250

## 2023-08-07 MED ORDER — RISAQUAD PO CAPS: 1.0000 | ORAL_CAPSULE | Freq: Every day | ORAL | Status: AC

## 2023-08-07 MED ORDER — DOCUSATE SODIUM 100 MG PO CAPS: 100.0000 mg | ORAL_CAPSULE | Freq: Two times a day (BID) | ORAL | Status: DC

## 2023-08-07 MED ORDER — ENSURE ENLIVE PO LIQD: 237.0000 mL | Freq: Two times a day (BID) | ORAL | Status: AC

## 2023-08-07 MED ORDER — PIPERACILLIN-TAZOBACTAM 3.375 G IVPB
3.3750 g | Freq: Three times a day (TID) | INTRAVENOUS | Status: DC
  Administered 2023-08-07 – 2023-08-13 (×18): 3.375 g via INTRAVENOUS
  Filled 2023-08-07 (×19): qty 50

## 2023-08-07 MED ORDER — POLYETHYLENE GLYCOL 3350 17 G PO PACK: 17.0000 g | PACK | Freq: Every day | ORAL | Status: DC

## 2023-08-07 MED ORDER — SIMETHICONE 80 MG PO CHEW: 80.0000 mg | CHEWABLE_TABLET | Freq: Four times a day (QID) | ORAL | Status: DC | PRN

## 2023-08-07 NOTE — Progress Notes (Signed)
 OT Cancellation Note  Patient Details Name: Francisco Padilla MRN: [id number] DOB: 1950/09/14   Cancelled Treatment:    Reason Eval/Treat Not Completed: Patient declined, no reason specified Politely declined therapy session due to overwhelmed with current medical issues. Agreeable for therapy to check back tomorrow.  Jahira Swiss  [name] 08/07/2023, 1:56 PM

## 2023-08-07 NOTE — Discharge Summary (Signed)
 Physician Discharge Summary   Patient: Francisco Padilla MRN: [id number] DOB: [birth year]  Admit date:     07/28/2023  Discharge date: 08/07/23  Discharge Physician: Elidia Sieving Katharyn Schauer   PCP: Sherlynn Madden, MD   Recommendations at discharge:    Patient is being transferred to tertiary care for management of bile leak, attempts for endoscopic intervention to control leak.  He is on IV Zosyn  for antibiotic therapy.  Chronic hypotension controlled with midodrine  and fludrocortisone .   Discharge Diagnoses: Principal Problem:   Acute cholecystitis Active Problems:   AKI (acute kidney injury) (HCC)   Aortic stenosis, severe   Atrial fibrillation with RVR (HCC)   Protein-calorie malnutrition, severe   History of Roux-en-Y gastric bypass   OSA on CPAP  Resolved Problems:   SIRS (systemic inflammatory response syndrome) [facility])  Hospital Course: Patient is a 73 year old male with pertinent PMH OSA on CPAP, HLD, chronic hypotension, PAF on Eliquis , PVD presents to [facility] ED on 1/5 with cholecystitis.   Patient complaining of weakness/fatigue going on for about a month.  Patient also having nausea, abdominal pain, diarrhea.  Abdominal pain worse in right upper quadrant.  Denies fever/chills.  Came to [facility] ED on 1/5 for further workup.  On arrival patient noted to be slightly tachycardic 100s and mildly hypotensive 93/73.  CT ABD/pelvis showing acute cholecystitis.  Surgery consulted.  Given IV fluids and Zosyn .  Awaiting surgery 48 hours for Eliquis  washout. Cards consulted for preoperative evaluation of cholecystectomy. Echo on 1/7 showing lvef 60-65%; severe aortic stenosis.  On 1/8 patient taken to the OR for cholecystectomy.  While in the OR patient had tachycardia 150s and started on esmolol .  Patient later became hypotensive and started on neo.  Postop patient taken to ICU while on pressors.  PCCM consulted.  1/5 admitted with cholecystitis 1/8 s/p cholecystectomy; given esmolol  for HR  150s then became hypotensive on neo-; postop taken to ICU and PCCM consulted 1/10 episode of rapid A-fib with RVR in the 150s, systolic pressure in the 70s.  01/13 no clinical signs of bile leak, and no need for ERCP.  01/14 worsening drain out put, suggesting resumption of bile leak. Plan for transfer to tertiary care.    Assessment and Plan: * Acute cholecystitis Gangrenous cholecystitis, complicated with sepsis.  Sp cholecystectomy, sepsis has resolved.  Apparent persistent bile leak with drain output 235 ml.   Wbc is 17.3  Considering increased drain output, will resume IV Zosyn .  Continue blood pressure monitoring.   Continue with solu cortef  and midodrine  for chronic hypotension. Stress dose steroids have been discontinued.  Currently in the waiting list for [facility] under GI, for endoscopic attempts to control leak as opposed to surgical access.   AKI (acute kidney injury) (HCC) Hyperkalemia and hyponatremia.   Stable renal function with serum cr at 0,69 with K at 3,5 and serum bicarbonate at 28  Na 133   Check renal panel in am.   Aortic stenosis, severe No signs of acute decompensation Continue blood pressure monitoring.   Atrial fibrillation with RVR (HCC) Improved rate control Will transition back to IV heparin  in preparation for possible ERCP ,   Protein-calorie malnutrition, severe Continue with nutritional supplements.   History of Roux-en-Y gastric bypass Follow up as outpatient.   OSA on CPAP Home Cpap.          Consultants: cardiology, surgery, critical care.  Procedures performed:  Subtotal cholecystectomy   Disposition:  tertiary care  Diet recommendation:  Regular diet DISCHARGE  MEDICATION: Allergies as of 08/07/2023       Reactions   Aspirin    Avoid due to bariatric surgery   Bupropion Other (See Comments)   Passed out   Nsaids    Avoid due to bariatric surgery   Oxycodone  Hcl    Hallucinations, agitation         Medication  List     STOP taking these medications    acetaminophen  650 MG CR tablet Commonly known as: TYLENOL  Replaced by: acetaminophen  325 MG tablet   amphetamine -dextroamphetamine  5 MG tablet Commonly known as: ADDERALL   atorvastatin  20 MG tablet Commonly known as: LIPITOR   cetirizine  10 MG tablet Commonly known as: ZYRTEC    CO Q 10 PO   COSAMIN DS PO   desvenlafaxine 50 MG 24 hr tablet Commonly known as: PRISTIQ Replaced by: venlafaxine  XR 37.5 MG 24 hr capsule   Eliquis  5 MG Tabs tablet Generic drug: apixaban    fluticasone  50 MCG/ACT nasal spray Commonly known as: FLONASE    gabapentin  300 MG capsule Commonly known as: NEURONTIN    LORazepam  1 MG tablet Commonly known as: ATIVAN    Melatonin 10 MG Tabs   OSTEO BI-FLEX ADV TRIPLE ST PO   PROBIOTIC PO   sildenafil 20 MG tablet Commonly known as: REVATIO   sodium chloride  0.65 % Soln nasal spray Commonly known as: OCEAN   SUPER B COMPLEX/C PO       TAKE these medications    acetaminophen  325 MG tablet Commonly known as: TYLENOL  Take 2 tablets (650 mg total) by mouth every 4 (four) hours. Replaces: acetaminophen  650 MG CR tablet   acidophilus Caps capsule Take 1 capsule by mouth daily. Start taking on: August 08, 2023   alfuzosin  10 MG 24 hr tablet Commonly known as: UROXATRAL  Take 10 mg by mouth daily with breakfast.   calcium  carbonate 500 MG chewable tablet Commonly known as: TUMS - dosed in mg elemental calcium  Chew 1 tablet (200 mg of elemental calcium  total) by mouth 3 (three) times daily.   docusate sodium  100 MG capsule Commonly known as: COLACE Take 1 capsule (100 mg total) by mouth 2 (two) times daily.   feeding supplement Liqd Take 237 mLs by mouth 2 (two) times daily between meals.   fludrocortisone  0.1 MG tablet Commonly known as: FLORINEF  Take 0.1 mg by mouth daily.   latanoprost  0.005 % ophthalmic solution Commonly known as: XALATAN  Place 1 drop into both eyes at bedtime.    midodrine  5 MG tablet Commonly known as: PROAMATINE  Take 1 tablet (5 mg total) by mouth 3 (three) times daily with meals.   One Daily Multivitamin Men Tabs Take 1 tablet by mouth 2 (two) times daily. CENTRUM SILVER   pantoprazole  40 MG tablet Commonly known as: PROTONIX  Take 40 mg by mouth in the morning and at bedtime.   piperacillin -tazobactam 3.375 GM/50ML IVPB Commonly known as: ZOSYN  Inject 50 mLs (3.375 g total) into the vein every 8 (eight) hours.   polyethylene glycol 17 g packet Commonly known as: MIRALAX  / GLYCOLAX  Take 17 g by mouth daily. Start taking on: August 08, 2023   senna-docusate 8.6-50 MG tablet Commonly known as: Senokot-S Take 1 tablet by mouth at bedtime as needed for mild constipation.   simethicone  80 MG chewable tablet Commonly known as: MYLICON Chew 1 tablet (80 mg total) by mouth 4 (four) times daily as needed for flatulence.   venlafaxine  XR 37.5 MG 24 hr capsule Commonly known as: EFFEXOR -XR Take 1 capsule (37.5  mg total) by mouth daily with breakfast. Start taking on: August 08, 2023 Replaces: desvenlafaxine 50 MG 24 hr tablet   zolpidem  10 MG tablet Commonly known as: AMBIEN  Take 5-10 mg by mouth at bedtime.        Follow-up Information     Lucien Orren SAILOR, PA-C Follow up.   Specialty: Cardiology Why: Friday Aug 31, 2023 Arrive by 8:10 AMAppt at 8:25 AM (25 min) Contact information: [address] [place] [address] [phone]                Discharge Exam: Fredricka Weights   08/05/23 0420 08/06/23 0701 08/07/23 0409  Weight: 98.5 kg 93 kg 93.6 kg   BP 98/74 (BP Location: Left Arm)   Pulse 91   Temp 97.9 F (36.6 C) (Oral)   Resp 17   Ht 6' 4 (1.93 m)   Wt 93.6 kg   SpO2 100%   BMI 25.12 kg/m   Patient with no chest pain or dyspnea. No nausea or vomiting, positive right upper quadrant abdominal pain controlled with analgesics  Neurology awake and alert ENT with mild pallor Cardiovascular  with S1 and S2 present, irregularly irregular with no gallops, rubs or murmurs No JVD No lower extremity edema Respiratory with no rales or wheezing, no rhonchi Abdomen with no distention, non tender to superficial palpation, drain in place with serous sanguinous drain    Condition at discharge: stable  The results of significant diagnostics from this hospitalization (including imaging, microbiology, ancillary and laboratory) are listed below for reference.   Imaging Studies: DG CHEST PORT 1 VIEW Result Date: 08/03/2023 CLINICAL DATA:  PICC in place. EXAM: PORTABLE CHEST 1 VIEW COMPARISON:  3 hours prior FINDINGS: Tip of the right upper extremity PICC in the region of the atrial caval junction. Lower lung volumes from earlier today. Stable mediastinal contours, aortic atherosclerosis. Increasing atelectasis in the left lung base and right infrahilar region. No pneumothorax or large pleural effusion. IMPRESSION: 1. Tip of the right upper extremity PICC in the region of the atrial caval junction. 2. Lower lung volumes with increasing atelectasis in the left lung base and right infrahilar region. Electronically Signed   By: Andrea Gasman M.D.   On: 08/03/2023 20:46   DG Chest Port 1 View Result Date: 08/03/2023 CLINICAL DATA:  Line placement EXAM: PORTABLE CHEST 1 VIEW COMPARISON:  X-ray 07/28/2023.  CT 07/28/2023. FINDINGS: Placement of a right-sided PICC with the tip along the upper right atrium. Stable cardiopericardial silhouette when adjusting for level of underinflation. Calcified aorta. Increasing bandlike changes right perihilar in the left lung base, favoring atelectasis. No pneumothorax, effusion or edema. Overlapping cardiac leads. The left inferior costophrenic angle is clipped off the edge of the film. Degenerative changes along the spine. IMPRESSION: Right-sided PICC in place with tip along the upper right atrium. Decreased inflation with some increasing atelectasis. Electronically  Signed   By: Ranell Bring M.D.   On: 08/03/2023 18:04   US  EKG SITE RITE Result Date: 08/03/2023 If Site Rite image not attached, placement could not be confirmed due to current cardiac rhythm.  LONG TERM MONITOR (3-14 DAYS) Result Date: 08/01/2023   Atrial Fibrillation occurred continuously (100% burden), ranging from 53-193 bpm (avg of 94 bpm). Patch Wear Time:  13 days and 13 hours (2024-12-17T13:14:02-499 to 2024-12-31T02:25:18-0500) 2 Ventricular Tachycardia runs occurred, the run with the fastest interval lasting 4 beats with a max rate of 164 bpm, the longest lasting 11.4 secs with  an avg rate of 132 bpm. Atrial Fibrillation occurred continuously (100% burden), ranging from 53-193  bpm (avg of 94 bpm). Isolated VEs were rare (<1.0%, 2515), VE Couplets were rare (<1.0%, 67), and VE Triplets were rare (<1.0%, 5). Ventricular Bigeminy was present.   ECHOCARDIOGRAM COMPLETE Result Date: 07/31/2023    ECHOCARDIOGRAM REPORT   Patient Name:   MIRAN KAUTZMAN Date of Exam: 07/31/2023 Medical Rec #:  [id number]     Height:       76.0 in Accession #:    [id number]    Weight:       205.0 lb Date of Birth:  1951/07/13     BSA:          2.239 m Patient Age:    72 years      BP:           96/74 mmHg Patient Gender: M             HR:           100 bpm. Exam Location:  Inpatient Procedure: 2D Echo, Cardiac Doppler, Color Doppler and Intracardiac            Opacification Agent Indications:    Preoperative Evaluation  History:        Patient has prior history of Echocardiogram examinations, most                 recent 09/11/2014. CAD, Aortic Valve Disease, Arrythmias:Atrial                 Fibrillation; Signs/Symptoms:Hypotension.  Sonographer:    Lanell Maduro Referring Phys: [id number] SHENG L HALEY  Sonographer Comments: Suboptimal parasternal window and suboptimal apical window. IMPRESSIONS  1. The aortic valve is severely calcified in views obtained. Vmax 2.9 m/s, MG 20 mmHG, AVA 0.96 cm2, DI 0.21. LV SVI low 22 cc/m2.  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 If there are clinical concerns for severe aortic stenosis, would recommend an aortic valve calcium  score for clarification. The aortic valve is calcified. Aortic valve regurgitation is not visualized. Moderate aortic valve stenosis. Aortic valve area, by VTI measures 0.96 cm. Aortic valve mean gradient measures 20.3 mmHg. Aortic valve Vmax measures 2.93 m/s.  2. Left ventricular ejection fraction, by estimation, is 60 to 65%. The left ventricle has normal function. The left ventricle has no regional wall motion abnormalities. Indeterminate diastolic filling due to E-A fusion.  3. Right ventricular systolic function is mildly reduced. The right ventricular size is mildly enlarged. There is normal pulmonary artery systolic pressure. The estimated right ventricular systolic pressure is 30.2 mmHg.  4. Left atrial size was moderately dilated.  5. Right atrial size was mildly dilated.  6. The mitral valve is grossly normal. Trivial mitral valve regurgitation. No evidence of mitral stenosis.  7. The inferior vena cava is normal in size with greater than 50% respiratory variability, suggesting right atrial pressure of 3 mmHg. FINDINGS  Left Ventricle: Left ventricular ejection fraction, by estimation, is 60 to 65%. The left ventricle has normal function. The left ventricle has no regional wall motion abnormalities. Definity  contrast agent was given IV to delineate the left ventricular  endocardial borders. The left ventricular internal cavity size was normal in size. There is no left ventricular hypertrophy. Indeterminate diastolic filling due to E-A fusion. Right Ventricle: The right ventricular size is mildly enlarged. No increase in right ventricular wall thickness. Right ventricular systolic function is mildly reduced. There is  normal pulmonary artery systolic pressure. The tricuspid regurgitant velocity  is 1.95 m/s, and with an  assumed right atrial pressure of 15 mmHg, the estimated right ventricular systolic pressure is 30.2 mmHg. Left Atrium: Left atrial size was moderately dilated. Right Atrium: Right atrial size was mildly dilated. Pericardium: There is no evidence of pericardial effusion. Mitral Valve: The mitral valve is grossly normal. Trivial mitral valve regurgitation. No evidence of mitral valve stenosis. Tricuspid Valve: The tricuspid valve is grossly normal. Tricuspid valve regurgitation is trivial. No evidence of tricuspid stenosis. Aortic Valve: The aortic valve is severely calcified in views obtained. Vmax 2.9 m/s, MG 20 mmHG, AVA 0.96 cm2, DI 0.21. LV SVI low 22 cc/m2. 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 If there are clinical concerns for severe aortic stenosis, would recommend an aortic valve calcium  score for clarification. The aortic valve is calcified. Aortic valve regurgitation is not visualized. Moderate aortic stenosis is present. Aortic valve mean gradient measures 20.3 mmHg. Aortic valve peak gradient measures 34.3 mmHg. Aortic valve area, by VTI measures 0.96 cm. Pulmonic Valve: The pulmonic valve was grossly normal. Pulmonic valve regurgitation is not visualized. No evidence of pulmonic stenosis. Aorta: The aortic root and ascending aorta are structurally normal, with no evidence of dilitation. Venous: The inferior vena cava is normal in size with greater than 50% respiratory variability, suggesting right atrial pressure of 3 mmHg. IAS/Shunts: The atrial septum is grossly normal.  LEFT VENTRICLE PLAX 2D LVIDd:         4.70 cm      Diastology LVIDs:         3.30 cm      LV e' medial:    10.40 cm/s LV PW:         1.00 cm      LV E/e' medial:  5.8 LV IVS:        0.90 cm      LV e' lateral:   14.60 cm/s LVOT diam:     2.40 cm      LV E/e' lateral: 4.1 LV SV:         50 LV SV Index:   22 LVOT Area:     4.52 cm  LV Volumes (MOD) LV vol d, MOD A2C: 61.7 ml LV vol d,  MOD A4C: 113.0 ml LV vol s, MOD A2C: 29.6 ml LV vol s, MOD A4C: 45.4 ml LV SV MOD A2C:     32.1 ml LV SV MOD A4C:     113.0 ml LV SV MOD BP:      51.5 ml RIGHT VENTRICLE            IVC RV Basal diam:  4.20 cm    IVC diam: 3.20 cm RV Mid diam:    3.40 cm RV S prime:     9.48 cm/s TAPSE (M-mode): 1.7 cm LEFT ATRIUM            Index        RIGHT ATRIUM           Index LA diam:      5.60 cm  2.50 cm/m   RA Area:     17.50 cm LA Vol (A4C): 115.0 ml 51.37 ml/m  RA Volume:   41.50 ml  18.54 ml/m  AORTIC VALVE AV Area (Vmax):    0.99 cm AV Area (Vmean):   0.98 cm AV Area (VTI):     0.96 cm AV Vmax:  292.98 cm/s AV Vmean:          200.557 cm/s AV VTI:            0.526 m AV Peak Grad:      34.3 mmHg AV Mean Grad:      20.3 mmHg LVOT Vmax:         64.30 cm/s LVOT Vmean:        43.400 cm/s LVOT VTI:          0.111 m LVOT/AV VTI ratio: 0.21  AORTA Ao Asc diam: 3.50 cm MITRAL VALVE               TRICUSPID VALVE MV Area (PHT): 6.32 cm    TR Peak grad:   15.2 mmHg MV Decel Time: 120 msec    TR Vmax:        195.00 cm/s MV E velocity: 60.20 cm/s MV A velocity: 40.30 cm/s  SHUNTS MV E/A ratio:  1.49        Systemic VTI:  0.11 m                            Systemic Diam: 2.40 cm Darryle Decent MD Electronically signed by Darryle Decent MD Signature Date/Time: 07/31/2023/10:21:24 AM    Final    CT Chest W Contrast Result Date: 07/29/2023 CLINICAL DATA:  Sepsis, weakness, fatigue. EXAM: CT CHEST WITH CONTRAST TECHNIQUE: Multidetector CT imaging of the chest was performed during intravenous contrast administration. RADIATION DOSE REDUCTION: This exam was performed according to the departmental dose-optimization program which includes automated exposure control, adjustment of the mA and/or kV according to patient size and/or use of iterative reconstruction technique. CONTRAST:  75mL OMNIPAQUE  IOHEXOL  350 MG/ML SOLN COMPARISON:  01/20/2011 FINDINGS: Cardiovascular: Heart is normal size. Aorta is normal caliber. Diffuse 3  vessel coronary artery disease. Aortic atherosclerosis. Mediastinum/Nodes: No mediastinal, hilar, or axillary adenopathy. Trachea and esophagus are unremarkable. Thyroid  unremarkable. Lungs/Pleura: Lungs are clear. No focal airspace opacities or suspicious nodules. No effusions. Upper Abdomen: See abdominal CT report Musculoskeletal: Chest wall soft tissues are unremarkable. No acute bony abnormality. IMPRESSION: No acute cardiopulmonary disease. Three vessel coronary artery disease. Aortic Atherosclerosis (ICD10-I70.0). Electronically Signed   By: Franky Crease M.D.   On: 07/29/2023 00:15   CT Head Wo Contrast Result Date: 07/29/2023 CLINICAL DATA:  Memory loss EXAM: CT HEAD WITHOUT CONTRAST TECHNIQUE: Contiguous axial images were obtained from the base of the skull through the vertex without intravenous contrast. RADIATION DOSE REDUCTION: This exam was performed according to the departmental dose-optimization program which includes automated exposure control, adjustment of the mA and/or kV according to patient size and/or use of iterative reconstruction technique. COMPARISON:  12/07/2022 FINDINGS: Brain: There is atrophy and chronic small vessel disease changes. No acute intracranial abnormality. Specifically, no hemorrhage, hydrocephalus, mass lesion, acute infarction, or significant intracranial injury. Vascular: No hyperdense vessel or unexpected calcification. Skull: No acute calvarial abnormality. Sinuses/Orbits: No acute findings Other: None IMPRESSION: Atrophy, chronic microvascular disease. No acute intracranial abnormality. Electronically Signed   By: Franky Crease M.D.   On: 07/29/2023 00:14   CT ABDOMEN PELVIS W CONTRAST Result Date: 07/29/2023 CLINICAL DATA:  Weakness, fatigue.  Abdominal pain. EXAM: CT ABDOMEN AND PELVIS WITH CONTRAST TECHNIQUE: Multidetector CT imaging of the abdomen and pelvis was performed using the standard protocol following bolus administration of intravenous contrast.  RADIATION DOSE REDUCTION: This exam was performed according to the departmental dose-optimization program which includes automated  exposure control, adjustment of the mA and/or kV according to patient size and/or use of iterative reconstruction technique. CONTRAST:  75mL OMNIPAQUE  IOHEXOL  350 MG/ML SOLN COMPARISON:  06/06/2021 FINDINGS: Lower chest: No acute abnormality. Hepatobiliary: Multiple layering gallstones within the gallbladder. Gallbladder is distended with gallbladder wall thickening. Findings concerning for acute cholecystitis. No focal hepatic abnormality. Mild periportal edema. No biliary ductal dilatation. Pancreas: Atrophy of the pancreas. No focal abnormality or ductal dilatation. Spleen: No focal abnormality.  Normal size. Adrenals/Urinary Tract: Adrenal glands normal. Numerous bilateral nonobstructing renal stones. Bilateral renal cysts are similar prior study. No follow-up imaging recommended. No definite ureteral stones. In the left pelvis posterior to the bladder in the region of the distal left ureter. The ureter is decompressed and difficult to follows/visualized. Given the decompressed state, I favor this is likely an adjacent phlebolith. Stomach/Bowel: Prior Roux-en-Y gastric bypass. Stomach, large and small bowel grossly unremarkable. Vascular/Lymphatic: Aortoiliac atherosclerosis. No evidence of aneurysm or adenopathy. Reproductive: No visible focal abnormality. Other: No free fluid or free air. Musculoskeletal: No acute bony abnormality. IMPRESSION: Cholelithiasis. Gallbladder distension with gallbladder wall thickening and surrounding inflammation concerning for acute cholecystitis. Periportal edema can be seen with volume overload/resuscitation or inflammatory processes such as hepatitis. Bilateral nephrolithiasis.  No hydronephrosis. Prior gastric bypass.  No visible complicating feature. Aortoiliac atherosclerosis. Electronically Signed   By: Franky Crease M.D.   On: 07/29/2023 00:13    DG Ribs Unilateral W/Chest Right Result Date: 07/28/2023 CLINICAL DATA:  Recent fall with right rib pain, initial encounter EXAM: RIGHT RIBS AND CHEST - 3+ VIEW COMPARISON:  07/31/2022 FINDINGS: Cardiac shadow is within normal limits. Aortic calcifications are noted. The lungs are clear bilaterally. No infiltrate or pneumothorax is seen. No acute rib abnormality noted. IMPRESSION: No acute rib fracture seen. Electronically Signed   By: Oneil Devonshire M.D.   On: 07/28/2023 23:39    Microbiology: Results for orders placed or performed during the hospital encounter of 07/28/23  Blood culture (routine x 2)     Status: None   Collection Time: 07/29/23 12:13 AM   Specimen: BLOOD RIGHT HAND  Result Value Ref Range Status   Specimen Description BLOOD RIGHT HAND  Final   Special Requests   Final    BOTTLES DRAWN AEROBIC AND ANAEROBIC Blood Culture results may not be optimal due to an inadequate volume of blood received in culture bottles   Culture   Final    NO GROWTH 5 DAYS Performed at [facility], [address] [place], [place] [address]    Report Status 08/03/2023 FINAL  Final  Blood culture (routine x 2)     Status: None   Collection Time: 07/29/23 12:13 AM   Specimen: BLOOD LEFT ARM  Result Value Ref Range Status   Specimen Description BLOOD LEFT ARM  Final   Special Requests   Final    BOTTLES DRAWN AEROBIC AND ANAEROBIC Blood Culture adequate volume   Culture   Final    NO GROWTH 5 DAYS Performed at [facility], [address] [place], [place] [address]    Report Status 08/03/2023 FINAL  Final  Resp panel by RT-PCR (RSV, Flu A&B, Covid) Anterior Nasal Swab     Status: None   Collection Time: 07/29/23  1:30 AM   Specimen: Anterior Nasal Swab  Result Value Ref Range Status   SARS Coronavirus 2 by RT PCR NEGATIVE NEGATIVE Final   Influenza A by PCR NEGATIVE NEGATIVE Final   Influenza B  by PCR NEGATIVE NEGATIVE Final    Comment: (NOTE) The Xpert Xpress  SARS-CoV-2/FLU/RSV plus assay is intended as an aid in the diagnosis of influenza from Nasopharyngeal swab specimens and should not be used as a sole basis for treatment. Nasal washings and aspirates are unacceptable for Xpert Xpress SARS-CoV-2/FLU/RSV testing.  Fact Sheet for Patients: bloggercourse.com  Fact Sheet for Healthcare Providers: seriousbroker.it  This test is not yet approved or cleared by the [place]  FDA and has been authorized for detection and/or diagnosis of SARS-CoV-2 by FDA under an Emergency Use Authorization (EUA). This EUA will remain in effect (meaning this test can be used) for the duration of the COVID-19 declaration under Section 564(b)(1) of the Act, 21 U.S.C. section 360bbb-3(b)(1), unless the authorization is terminated or revoked.     Resp Syncytial Virus by PCR NEGATIVE NEGATIVE Final    Comment: (NOTE) Fact Sheet for Patients: bloggercourse.com  Fact Sheet for Healthcare Providers: seriousbroker.it  This test is not yet approved or cleared by the [place]  FDA and has been authorized for detection and/or diagnosis of SARS-CoV-2 by FDA under an Emergency Use Authorization (EUA). This EUA will remain in effect (meaning this test can be used) for the duration of the COVID-19 declaration under Section 564(b)(1) of the Act, 21 U.S.C. section 360bbb-3(b)(1), unless the authorization is terminated or revoked.  Performed at [facility], [address] [place], [place] [address]   Surgical pcr screen     Status: Abnormal   Collection Time: 07/31/23  4:22 PM   Specimen: Nasal Mucosa; Nasal Swab  Result Value Ref Range Status   MRSA, PCR NEGATIVE NEGATIVE Final   Staphylococcus aureus POSITIVE (A) NEGATIVE Final    Comment: (NOTE) The Xpert SA Assay (FDA approved for NASAL specimens in patients 45 years of age and older), is one  component of a comprehensive surveillance program. It is not intended to diagnose infection nor to guide or monitor treatment. Performed at [facility], [address] [place], [place] [address]     Labs: CBC: Recent Labs  Lab 08/04/23 1429 08/05/23 0513 08/06/23 0845 08/07/23 0840 08/07/23 0930  WBC 24.7* 24.7* 20.5* 17.0* 17.3*  NEUTROABS  --   --   --   --  15.5*  HGB 10.7* 10.6* 11.0* 11.6* 11.0*  HCT 31.5* 31.5* 32.6* 35.0* 33.0*  MCV 91.6 91.3 90.8 92.8 92.4  PLT 219 252 288 311 309   Basic Metabolic Panel: Recent Labs  Lab 08/01/23 0303 08/01/23 2009 08/02/23 0221 08/03/23 0845 08/04/23 1521 08/05/23 0513 08/06/23 0845  NA 132* 135 133* 131* 134* 132* 133*  K 3.4* 4.0 4.8 3.9 4.2 4.1 3.5  CL 99 104 101 98 101 99 97*  CO2 23 20* 19* 25 25 25 28   GLUCOSE 107* 146* 154* 148* 178* 141* 127*  BUN 18 19 20 22 22 20 18   CREATININE 0.67 0.75 0.81 0.89 0.67 0.57* 0.69  CALCIUM  8.8* 8.8* 8.8* 8.9 8.8* 8.7* 8.6*  MG 1.8 1.7 1.9 2.0  --   --   --    Liver Function Tests: Recent Labs  Lab 08/01/23 0303 08/01/23 2009 08/02/23 0221 08/03/23 0844  AST 43* 34 32 29  ALT 53* 46* 43 35  ALKPHOS 78 73 69 79  BILITOT 1.4* 1.3* 1.5* 1.1  PROT 5.3* 5.5* 5.8* 6.0*  ALBUMIN  2.2* 2.5* 2.5* 2.4*   CBG: Recent Labs  Lab 08/05/23 0012 08/05/23 0416 08/05/23 0847 08/05/23 1158 08/05/23 1625  GLUCAP 133* 118* 215* 159* 151*    Discharge time spent: greater than 30 minutes.  Signed: Elidia Toribio Furnace, MD [facility] 08/07/2023

## 2023-08-07 NOTE — Progress Notes (Signed)
 [name] has declined transfer for this patient due to capacity.  Patient refuses to go to [facility] is reviewing his case, but has a significant waitlist and unable to say whether they could accommodate this patient in a timely manner.  Will update more when I hear back from them.  Burnard [name]

## 2023-08-07 NOTE — Progress Notes (Signed)
 PHARMACY - ANTICOAGULATION Pharmacy Consult for Heparin  Indication: atrial fibrillation  Allergies  Allergen Reactions   Aspirin     Avoid due to bariatric surgery   Bupropion Other (See Comments)    Passed out   Nsaids     Avoid due to bariatric surgery   Oxycodone  Hcl     Hallucinations, agitation     Patient Measurements: Height: 6' 4 (193 cm) Weight: 93.6 kg (206 lb 5.6 oz) IBW/kg (Calculated) : 86.8 Heparin  Dosing Weight: 93 kg  Vital Signs: Temp: 97.7 F (36.5 C) (01/14 0716) Temp Source: Oral (01/14 0716) BP: 110/88 (01/14 0716) Pulse Rate: 73 (01/14 0716)  Labs: Recent Labs    08/04/23 1429 08/04/23 1521 08/04/23 2100 08/05/23 0513 08/05/23 1526 08/05/23 2227 08/06/23 0800 08/06/23 0845 08/06/23 1506  HGB 10.7*  --   --  10.6*  --   --   --  11.0*  --   HCT 31.5*  --   --  31.5*  --   --   --  32.6*  --   PLT 219  --   --  252  --   --   --  288  --   HEPARINUNFRC  --   --    < > 0.13*   < > 0.18* 0.34  --  0.30  CREATININE  --  0.67  --  0.57*  --   --   --  0.69  --    < > = values in this interval not displayed.    Estimated Creatinine Clearance: 104 mL/min (by C-G formula based on SCr of 0.69 mg/dL).  Assessment: 73 y.o. male . Patine wason Eliquis  pta for PAF. Last dose given 1/3. Transitioned to IV Heparin  which was stopped 1/8 AM for cholecystectomy. Held throughout post-operative day 0 (1/9) due to bleeding loss during surgery. Pharmacy consulted to resume IV Heparin   on 08/03/23 AM - no bolus. Pt continues on heparin  pending plans / need for ERCP.   s/p cholecystectomy 08/01/23  08/07/23:  Heparin  was stopped and transitioned back to his Apixaban  (PTA) 5mg  bid yesterday /13/25 PM. This morning , surgery notes patient having increasing abdominal pain in RUQ overnight and JP drain has returned to bilious/murky output. Surgery team communicating with [name] for adv GI involvement for ERCP given bypass anatomy. Apixaban  held and resuming IV heparin   08/07/23.   Pharmacy consulted to restart IV heparin  infusion. Patient  received 2 doses of Apixaban  5mg , last given 1/14 @ 0818.  Heparin  level was therapeutic yesterday on Heparin  rate  2700 units/hr, thus will restart at this rate at 20:00 tonight.  Today's CBC is pending.  Last CBC done 1/13:  Hgb stable in 10s-11, Plt 200s.  No active bleeding reported.    Goal of Therapy:  Heparin  level 0.3-0.7 units/mL Monitor platelets by anticoagulation protocol: Yes   Plan:  Check baseline PTT/HL prior to restarting heparin  as patient  received 2 doses of Apixaban  5mg , last given 1/14 @ 0818.   At 20:00 restart Heparin  at 2700 units/hr  Check 8hr aPTT/HL Daily aPTT, HL until they correlate and CBC  Thank you for allowing pharmacy to be part of this patients care team.  Levorn Gaskins, RPh Clinical Pharmacist  08/07/2023 9:39 AM

## 2023-08-07 NOTE — TOC Progression Note (Signed)
 Transition of Care [facility]) - Progression Note    Patient Details  Name: MARTI ACEBO MRN: [id number] Date of Birth: 11/19/1950  [facility] CM/SW Contact  [name], [place] Phone Number: 08/07/2023, 10:16 AM  Clinical Narrative:   CSW left VM for [facility] admissions coordinator to inquire if they will accept pt based on ambulation/gait.     Expected Discharge Plan: Home w Home Health Services Barriers to Discharge: Continued Medical Work up  Expected Discharge Plan and Services In-house Referral: NA Discharge Planning Services: CM Consult Post Acute Care Choice: NA Living arrangements for the past 2 months: Single Family Home                 DME Arranged: N/A DME Agency: NA       HH Arranged: NA           Social Determinants of Health (SDOH) Interventions SDOH Screenings   Food Insecurity: No Food Insecurity (07/29/2023)  Housing: Low Risk  (07/29/2023)  Transportation Needs: No Transportation Needs (07/29/2023)  Utilities: Not At Risk (07/29/2023)  Social Connections: Patient Declined (07/31/2023)  Tobacco Use: Low Risk  (08/01/2023)    Readmission Risk Interventions     No data to display

## 2023-08-07 NOTE — Plan of Care (Signed)
  Problem: Clinical Measurements: Goal: Ability to maintain clinical measurements within normal limits will improve Outcome: Progressing Goal: Respiratory complications will improve Outcome: Progressing   Problem: Activity: Goal: Risk for activity intolerance will decrease Outcome: Progressing   Problem: Clinical Measurements: Goal: Will remain free from infection Outcome: Not Progressing Goal: Diagnostic test results will improve Outcome: Not Progressing

## 2023-08-07 NOTE — Progress Notes (Signed)
   08/07/23 0036  BiPAP/CPAP/SIPAP  Reason BIPAP/CPAP not in use Other(comment) (refused, states his nasal pillows were lost and he can't tol our masks)  Flow Rate 4 lpm (for HS use)  BiPAP/CPAP /SiPAP Vitals  Pulse Rate 88  SpO2 99 %

## 2023-08-07 NOTE — Progress Notes (Signed)
 6 Days Post-Op  Subjective: Increasing abdominal pain in RUQ overnight.  JP drain has returned to bilious/murky output.  I drained over 140cc while I was in his room.  Still eating well.  ROS: See above, otherwise other systems negative  Objective: Vital signs in last 24 hours: Temp:  [97.6 F (36.4 C)-98 F (36.7 C)] 97.7 F (36.5 C) (01/14 0716) Pulse Rate:  [73-93] 73 (01/14 0716) Resp:  [13-19] 17 (01/14 0716) BP: (84-121)/(58-90) 110/88 (01/14 0716) SpO2:  [98 %-100 %] 100 % (01/14 0716) Weight:  [93.6 kg] 93.6 kg (01/14 0409) Last BM Date : 08/06/23  Intake/Output from previous day: 01/13 0701 - 01/14 0700 In: 837 [P.O.:837] Out: 1400 [Urine:1400] Intake/Output this shift: Total I/O In: 240 [P.O.:240] Out: 1040 [Urine:900; Drains:140]  PE: Gen: NAD Abd: soft, more tender in in RUQ today, JP with 140cc of murky bilious fluid in JP drain today, incisions are c/d/i  Lab Results:  Recent Labs    08/05/23 0513 08/06/23 0845  WBC 24.7* 20.5*  HGB 10.6* 11.0*  HCT 31.5* 32.6*  PLT 252 288   BMET Recent Labs    08/05/23 0513 08/06/23 0845  NA 132* 133*  K 4.1 3.5  CL 99 97*  CO2 25 28  GLUCOSE 141* 127*  BUN 20 18  CREATININE 0.57* 0.69  CALCIUM  8.7* 8.6*   PT/INR No results for input(s): LABPROT, INR in the last 72 hours. CMP     Component Value Date/Time   NA 133 (L) 08/06/2023 0845   K 3.5 08/06/2023 0845   CL 97 (L) 08/06/2023 0845   CO2 28 08/06/2023 0845   GLUCOSE 127 (H) 08/06/2023 0845   BUN 18 08/06/2023 0845   CREATININE 0.69 08/06/2023 0845   CREATININE 0.77 07/19/2023 1128   CALCIUM  8.6 (L) 08/06/2023 0845   PROT 6.0 (L) 08/03/2023 0844   ALBUMIN  2.4 (L) 08/03/2023 0844   AST 29 08/03/2023 0844   ALT 35 08/03/2023 0844   ALKPHOS 79 08/03/2023 0844   BILITOT 1.1 08/03/2023 0844   GFRNONAA >60 08/06/2023 0845   GFRAA  10/23/2009 0135    >60        The eGFR has been calculated using the MDRD equation. This calculation  has not been validated in all clinical situations. eGFR's persistently <60 mL/min signify possible Chronic Kidney Disease.   Lipase     Component Value Date/Time   LIPASE 29 07/29/2023 0014       Studies/Results: No results found.   Anti-infectives: Anti-infectives (From admission, onward)    Start     Dose/Rate Route Frequency Ordered Stop   07/29/23 0800  piperacillin -tazobactam (ZOSYN ) IVPB 3.375 g        3.375 g 12.5 mL/hr over 240 Minutes Intravenous Every 8 hours 07/29/23 0207 08/06/23 2359   07/29/23 0030  piperacillin -tazobactam (ZOSYN ) IVPB 3.375 g        3.375 g 100 mL/hr over 30 Minutes Intravenous  Once 07/29/23 0020 07/29/23 0147        Assessment/Plan POD 5, s/p subtotal cholecystectomy for gangrenous cholecystitis, Dr. Polly 1/8 -cont regular diet -cont abx therapy.  WBC 2oK yesterday, labs just drawn while I was in the room.  Await these results -last LFTs were normal -drain output has returned to increased output that is bilious in nature, suggesting resumption of his bile leak.  His pain in his abdomen is almost certainly undrained bile.  Hopefully getting his JP working better will help evacuate this. -at  some point may need a CT scan to rule out undrained collections, but appears well overall right now, so will hold -I have initiated communications with [name] for adv GI involvement for ERCP given bypass anatomy -will need to hold Eliquis  and resume heparin  -d/w primary service today     FEN - regular VTE - hold eliquis  and resume heparin  gtt ID - Zosyn , cont at this time given WBC still elevated and murky bilious leak  A fib AKI Peripheral neuropathy GERD HLD Hx of gastric bypass   LOS: 9 days    Burnard [name] , [facility] 08/07/2023, 8:55 AM Please see Amion for pager number during day hours 7:00am-4:30pm or 7:00am -11:30am on weekends

## 2023-08-07 NOTE — Progress Notes (Signed)
 Pharmacy Antibiotic Note  Francisco Padilla is a 73 y.o. male admitted on 07/28/2023 with generalized weakness/fatigue and CT abdomen showing concerns for acute cholecystitis.   He is now s/p cholecystectomy 1/8 , POD#6 On 1/13 Zosyn  was discontinued after 9 days of therapy.  Today 1/14 Surgery PA noted to continue  Zosyn  at this time given WBC still elevated  and contents in drain appear purulent in nature, more tender RUQ overnight and JP with 140cc of murky bilious fluid in JP drain incisions are c/d/I. Pharmacy has been consulted to restart Zosyn  08/07/23.SABRA    Plan: Restart Zosyn  3.375gm IV every 8 hours  -Monitor renal function -Follow up signs of clinical improvement, LOT, de-escalation of antibiotics    Height: 6' 4 (193 cm) Weight: 93.6 kg (206 lb 5.6 oz) IBW/kg (Calculated) : 86.8  Temp (24hrs), Avg:97.8 F (36.6 C), Min:97.6 F (36.4 C), Max:98 F (36.7 C)  Recent Labs  Lab 08/02/23 0221 08/02/23 1955 08/03/23 0845 08/03/23 1255 08/04/23 0321 08/04/23 1429 08/04/23 1521 08/05/23 0513 08/06/23 0845 08/07/23 0840  WBC 15.1*   < > 25.1*  --  27.0* 24.7*  --  24.7* 20.5* 17.0*  CREATININE 0.81  --  0.89  --   --   --  0.67 0.57* 0.69  --   LATICACIDVEN  --   --   --  1.4  --   --   --   --   --   --    < > = values in this interval not displayed.    Estimated Creatinine Clearance: 104 mL/min (by C-G formula based on SCr of 0.69 mg/dL).    Allergies  Allergen Reactions   Aspirin     Avoid due to bariatric surgery   Bupropion Other (See Comments)    Passed out   Nsaids     Avoid due to bariatric surgery   Oxycodone  Hcl     Hallucinations, agitation     Antimicrobials this admission: Zosyn  1/5 >> 1/13; restart 1/14>>   Microbiology results: 1/5 bcx x2: negative 1/5 resp panel pcr:  negative 1/7 MRSA PCRr (-) negative;   SA PCR(+)   Thank you for allowing pharmacy to be a part of this patient's care.  Levorn Gaskins, RPh Clinical Pharmacist  08/07/2023  10:09 AM

## 2023-08-08 ENCOUNTER — Inpatient Hospital Stay (HOSPITAL_COMMUNITY): Payer: Medicare Other

## 2023-08-08 DIAGNOSIS — N179 Acute kidney failure, unspecified: Secondary | ICD-10-CM | POA: Diagnosis not present

## 2023-08-08 DIAGNOSIS — I350 Nonrheumatic aortic (valve) stenosis: Secondary | ICD-10-CM | POA: Diagnosis not present

## 2023-08-08 DIAGNOSIS — K819 Cholecystitis, unspecified: Secondary | ICD-10-CM | POA: Diagnosis not present

## 2023-08-08 DIAGNOSIS — K810 Acute cholecystitis: Secondary | ICD-10-CM | POA: Diagnosis not present

## 2023-08-08 LAB — CBC
HCT: 31.8 % — ABNORMAL LOW (ref 39.0–52.0)
Hemoglobin: 10.5 g/dL — ABNORMAL LOW (ref 13.0–17.0)
MCH: 30.7 pg (ref 26.0–34.0)
MCHC: 33 g/dL (ref 30.0–36.0)
MCV: 93 fL (ref 80.0–100.0)
Platelets: 295 10*3/uL (ref 150–400)
RBC: 3.42 MIL/uL — ABNORMAL LOW (ref 4.22–5.81)
RDW: 13 % (ref 11.5–15.5)
WBC: 15.5 10*3/uL — ABNORMAL HIGH (ref 4.0–10.5)
nRBC: 0 % (ref 0.0–0.2)

## 2023-08-08 LAB — COMPREHENSIVE METABOLIC PANEL
ALT: 19 U/L (ref 0–44)
AST: 15 U/L (ref 15–41)
Albumin: 1.8 g/dL — ABNORMAL LOW (ref 3.5–5.0)
Alkaline Phosphatase: 51 U/L (ref 38–126)
Anion gap: 7 (ref 5–15)
BUN: 17 mg/dL (ref 8–23)
CO2: 30 mmol/L (ref 22–32)
Calcium: 8.2 mg/dL — ABNORMAL LOW (ref 8.9–10.3)
Chloride: 100 mmol/L (ref 98–111)
Creatinine, Ser: 0.59 mg/dL — ABNORMAL LOW (ref 0.61–1.24)
GFR, Estimated: >60 mL/min (ref >60)
Glucose, Bld: 93 mg/dL (ref 70–99)
Potassium: 3.6 mmol/L (ref 3.5–5.1)
Sodium: 137 mmol/L (ref 135–145)
Total Bilirubin: 0.7 mg/dL (ref 0.0–1.2)
Total Protein: 4.5 g/dL — ABNORMAL LOW (ref 6.5–8.1)

## 2023-08-08 LAB — HEPARIN LEVEL (UNFRACTIONATED)
Heparin Unfractionated: 0.36 [IU]/mL (ref 0.30–0.70)
Heparin Unfractionated: 0.4 [IU]/mL (ref 0.30–0.70)

## 2023-08-08 LAB — APTT
aPTT: 70 s — ABNORMAL HIGH (ref 24–36)
aPTT: 96 s — ABNORMAL HIGH (ref 24–36)

## 2023-08-08 MED ORDER — GADOBUTROL 1 MMOL/ML IV SOLN
10.0000 mL | Freq: Once | INTRAVENOUS | Status: AC | PRN
  Administered 2023-08-08: 10 mL via INTRAVENOUS

## 2023-08-08 NOTE — Progress Notes (Addendum)
 [facility]                                                                              Arend Harwell, is a 73 y.o. male, DOB - 29-Oct-1950, ZDG:[id number] Admit date - 07/28/2023    Outpatient Primary MD for the patient is Tye Gall, MD  LOS - 10  days  Chief Complaint  Patient presents with   Weakness       Brief summary   Patient is a 73 year old male with pertinent PMH OSA on CPAP, HLD, chronic hypotension, PAF on Eliquis , PVD presents to [facility] ED on 1/5 with cholecystitis.   Patient complaining of weakness/fatigue going on for about a month.  Patient also having nausea, abdominal pain, diarrhea.  Abdominal pain worse in right upper quadrant.  Denies fever/chills.  Came to [facility] ED on 1/5 for further workup.  On arrival patient noted to be slightly tachycardic 100s and mildly hypotensive 93/73.  CT ABD/pelvis showing acute cholecystitis.  Surgery consulted.  Given IV fluids and Zosyn .  Awaiting surgery 48 hours for Eliquis  washout. Cards consulted for preoperative evaluation of cholecystectomy. Echo on 1/7 showing lvef 60-65%; severe aortic stenosis.  On 1/8 patient taken to the OR for cholecystectomy.  While in the OR patient had tachycardia 150s and started on esmolol .  Patient later became hypotensive and started on neo.  Postop patient taken to ICU while on pressors.  PCCM consulted.   1/5 admitted with cholecystitis 1/8 s/p cholecystectomy; given esmolol  for HR 150s then became hypotensive on neo-; postop taken to ICU and PCCM consulted 1/10 episode of rapid A-fib with RVR in the 150s, systolic pressure in the 70s. 59/56: no clinical signs of bile leak, and no need for ERCP.  01/14: worsening drain out put, suggesting resumption of bile leak. Plan for transfer to tertiary care.     Assessment & Plan    Principal Problem:   Acute cholecystitis Gangrenous cholecystitis, complicated with sepsis status post subtotal cholecystectomy by Dr. Davonna Estes on 1/8   -Postop ICU care due to tachycardia requiring esmolol  and subsequent hypotension/shock requiring vasopressors, A-fib with RVR. -Completed antibiotic management with Zosyn  -MRCP showed undrained fluid collection that the surgical drain traverses but unable to completely evacuate -General Surgery following, recommended IR evaluation for perc drain for the fluid collection as well as PTC drain placement however patient currently hesitant -Diet advanced to regular diet until patient decision regarding IR procedure. -Also awaiting transfer to tertiary care for endoscopic attempt to control leak -Holding Eliquis    Chronic hypotension -Initially placed on Solu-Cortef , stress dose steroids have been discontinued -Continue midodrine , Florinef     AKI (acute kidney injury) (HCC) -Creatinine stable  Hyponatremia -Improving -Tolerating p.o. diet, advance to regular diet today     Aortic stenosis, severe Stable, no signs of acute decompensation    Atrial fibrillation with RVR (HCC) -Improved rate control -Currently on IV heparin  drip   History of Roux-en-Y gastric bypass Follow up as outpatient.    OSA on CPAP Continue Home Cpap.    Pressure injury documentation Right buttocks, stage I, Not present on admission -Nursing care     Severe  calorie malnutrition Nutrition Problem: Severe Malnutrition Etiology: chronic illness (gastric bypass) Signs/Symptoms: severe fat depletion, severe muscle depletion Interventions: Ensure Enlive (each supplement provides 350kcal and 20 grams of protein), MVI, Carnation Instant Breakfast  Estimated body mass index is 25.12 kg/m as calculated from the following:   Height as of this encounter: 6\' 4"  (1.93 m).   Weight as of this encounter: 93.6 kg.  Code Status: Full code DVT Prophylaxis:  SCDs Start: 07/29/23 0131 Place TED hose Start: 07/29/23 0131   Level of Care: Level of care: Telemetry Cardiac Family Communication: Updated  patient' Disposition Plan:      Remains inpatient appropriate: Awaiting transfer to tertiary care   Procedures:  08/01/2023 subtotal cholecystectomy (Dr Armond Bertin, general surgery)  Consultants:   Cardiology General Surgery Critical care  Antimicrobials:   Anti-infectives (From admission, onward)    Start     Dose/Rate Route Frequency Ordered Stop   08/07/23 1100  piperacillin -tazobactam (ZOSYN ) IVPB 3.375 g        3.375 g 12.5 mL/hr over 240 Minutes Intravenous Every 8 hours 08/07/23 1008     08/07/23 0000  piperacillin -tazobactam (ZOSYN ) 3.375 GM/50ML IVPB        3.375 g Intravenous Every 8 hours 08/07/23 1501     07/29/23 0800  piperacillin -tazobactam (ZOSYN ) IVPB 3.375 g        3.375 g 12.5 mL/hr over 240 Minutes Intravenous Every 8 hours 07/29/23 0207 08/06/23 2359   07/29/23 0030  piperacillin -tazobactam (ZOSYN ) IVPB 3.375 g        3.375 g 100 mL/hr over 30 Minutes Intravenous  Once 07/29/23 0020 07/29/23 0147          Medications  acetaminophen   650 mg Oral Q4H   Or   acetaminophen   650 mg Rectal Q4H   acidophilus  1 capsule Oral Daily   alfuzosin   10 mg Oral Q breakfast   calcium  carbonate  200 mg of elemental calcium  Oral TID   Chlorhexidine  Gluconate Cloth  6 each Topical Daily   docusate sodium   100 mg Oral BID   feeding supplement  237 mL Oral BID BM   fludrocortisone   0.1 mg Oral Daily   gabapentin   600 mg Oral QHS   latanoprost   1 drop Both Eyes QHS   midodrine   5 mg Oral TID WC   multivitamin with minerals  1 tablet Oral BID   pantoprazole   40 mg Oral Daily   polyethylene glycol  17 g Oral Daily   sodium chloride  flush  3 mL Intravenous Q12H   venlafaxine  XR  37.5 mg Oral Q breakfast      Subjective:   Darol Cola was seen and examined today.  At the time of my encounter, eating breakfast, no complaints.  No chest pain, shortness of breath, fevers, acute abdominal pain nausea vomiting.  Tolerating diet.    Objective:   Vitals:    08/07/23 2128 08/07/23 2307 08/08/23 0629 08/08/23 0745  BP:  103/71 91/66 102/75  Pulse: 82 89  90  Resp: (!) 9 16 (!) 21 17  Temp:  97.6 F (36.4 C) 98 F (36.7 C) 98.4 F (36.9 C)  TempSrc:   Oral Oral  SpO2: 99% 99%  99%  Weight:      Height:        Intake/Output Summary (Last 24 hours) at 08/08/2023 1125 Last data filed at 08/08/2023 0900 Gross per 24 hour  Intake 1114.23 ml  Output 1478 ml  Net -363.77 ml  Wt Readings from Last 3 Encounters:  08/07/23 93.6 kg  07/19/23 92.4 kg  06/14/23 96.9 kg     Exam General: Alert and oriented x 3, NAD Cardiovascular: S1 S2 auscultated,  RRR Respiratory: Clear to auscultation bilaterally, no wheezing Gastrointestinal: Soft, tender in RUQ, JP drain Ext: no pedal edema bilaterally Neuro: no new deficits Psych: Normal affect     Data Reviewed:  I have personally reviewed following labs    CBC Lab Results  Component Value Date   WBC 15.5 (H) 08/08/2023   RBC 3.42 (L) 08/08/2023   HGB 10.5 (L) 08/08/2023   HCT 31.8 (L) 08/08/2023   MCV 93.0 08/08/2023   MCH 30.7 08/08/2023   PLT 295 08/08/2023   MCHC 33.0 08/08/2023   RDW 13.0 08/08/2023   LYMPHSABS 0.7 08/07/2023   MONOABS 0.9 08/07/2023   EOSABS 0.0 08/07/2023   BASOSABS 0.0 08/07/2023     Last metabolic panel Lab Results  Component Value Date   NA 137 08/08/2023   K 3.6 08/08/2023   CL 100 08/08/2023   CO2 30 08/08/2023   BUN 17 08/08/2023   CREATININE 0.59 (L) 08/08/2023   GLUCOSE 93 08/08/2023   GFRNONAA >60 08/08/2023   GFRAA  10/23/2009    >60        The eGFR has been calculated using the MDRD equation. This calculation has not been validated in all clinical situations. eGFR's persistently <60 mL/min signify possible Chronic Kidney Disease.   CALCIUM  8.2 (L) 08/08/2023   PROT 4.5 (L) 08/08/2023   ALBUMIN  1.8 (L) 08/08/2023   BILITOT 0.7 08/08/2023   ALKPHOS 51 08/08/2023   AST 15 08/08/2023   ALT 19 08/08/2023   ANIONGAP 7  08/08/2023    CBG (last 3)  Recent Labs    08/05/23 1158 08/05/23 1625  GLUCAP 159* 151*      Coagulation Profile: Recent Labs  Lab 08/01/23 2009  INR 1.3*     Radiology Studies: I have personally reviewed the imaging studies  MR ABDOMEN MRCP W WO CONTAST Result Date: 08/08/2023 CLINICAL DATA:  Bile leak EXAM: MRI ABDOMEN WITHOUT AND WITH CONTRAST (INCLUDING MRCP) TECHNIQUE: Multiplanar multisequence MR imaging of the abdomen was performed both before and after the administration of intravenous contrast. Heavily T2-weighted images of the biliary and pancreatic ducts were obtained, and three-dimensional MRCP images were rendered by post processing. CONTRAST:  10mL GADAVIST  GADOBUTROL  1 MMOL/ML IV SOLN COMPARISON:  CT abdomen/pelvis dated 07/29/2023. FINDINGS: Motion degraded images. Lower chest: Small bilateral pleural effusions. Hepatobiliary: Liver is within normal limits. Status post subtotal cholecystectomy with residual gallbladder lumen and soft tissue debris in the surgical bed. Associated 9.2 x 6.1 x 10.8 cm fluid collection extending from the gallbladder fossa (series 6/image 31) to the inferior right perihepatic space (series 2/image 29). No intrahepatic or extrahepatic ductal dilatation. Common duct measures 5 mm, without discontinuity on MR. No choledocholithiasis is seen. Pancreas:  Within normal limits. Spleen:  Within normal limits. Adrenals/Urinary Tract:  Adrenal glands are within normal limits. Bilateral simple renal cysts, measuring up to 4.7 cm in the anterior left lower kidney (series 3/image 32), benign (Bosniak I). No follow-up is recommended. No hydronephrosis. Stomach/Bowel: Stomach is within normal limits. Visualized bowel is grossly unremarkable. Vascular/Lymphatic:  No evidence of aortic aneurysm. No suspicious abdominal lymphadenopathy. Other:  No abdominal ascites. Musculoskeletal: No focal osseous lesions. IMPRESSION: Status post subtotal cholecystectomy with  residual gallbladder lumen and soft tissue debris in the surgical bed. Associated 10.8 cm fluid  collection extending from the gallbladder fossa to the inferior right perihepatic space. No intrahepatic or extrahepatic ductal dilatation. Common duct measures 5 mm, without discontinuity on MR. No choledocholithiasis is seen. Small bilateral pleural effusions. Electronically Signed   By: Zadie Herter M.D.   On: 08/08/2023 03:10   MR 3D Recon At Scanner Result Date: 08/08/2023 CLINICAL DATA:  Bile leak EXAM: MRI ABDOMEN WITHOUT AND WITH CONTRAST (INCLUDING MRCP) TECHNIQUE: Multiplanar multisequence MR imaging of the abdomen was performed both before and after the administration of intravenous contrast. Heavily T2-weighted images of the biliary and pancreatic ducts were obtained, and three-dimensional MRCP images were rendered by post processing. CONTRAST:  10mL GADAVIST  GADOBUTROL  1 MMOL/ML IV SOLN COMPARISON:  CT abdomen/pelvis dated 07/29/2023. FINDINGS: Motion degraded images. Lower chest: Small bilateral pleural effusions. Hepatobiliary: Liver is within normal limits. Status post subtotal cholecystectomy with residual gallbladder lumen and soft tissue debris in the surgical bed. Associated 9.2 x 6.1 x 10.8 cm fluid collection extending from the gallbladder fossa (series 6/image 31) to the inferior right perihepatic space (series 2/image 29). No intrahepatic or extrahepatic ductal dilatation. Common duct measures 5 mm, without discontinuity on MR. No choledocholithiasis is seen. Pancreas:  Within normal limits. Spleen:  Within normal limits. Adrenals/Urinary Tract:  Adrenal glands are within normal limits. Bilateral simple renal cysts, measuring up to 4.7 cm in the anterior left lower kidney (series 3/image 32), benign (Bosniak I). No follow-up is recommended. No hydronephrosis. Stomach/Bowel: Stomach is within normal limits. Visualized bowel is grossly unremarkable. Vascular/Lymphatic:  No evidence of aortic  aneurysm. No suspicious abdominal lymphadenopathy. Other:  No abdominal ascites. Musculoskeletal: No focal osseous lesions. IMPRESSION: Status post subtotal cholecystectomy with residual gallbladder lumen and soft tissue debris in the surgical bed. Associated 10.8 cm fluid collection extending from the gallbladder fossa to the inferior right perihepatic space. No intrahepatic or extrahepatic ductal dilatation. Common duct measures 5 mm, without discontinuity on MR. No choledocholithiasis is seen. Small bilateral pleural effusions. Electronically Signed   By: Zadie Herter M.D.   On: 08/08/2023 03:10       Reyaansh Merlo M.D. [facility] 08/08/2023, 11:25 AM  Available via Epic secure chat 7am-7pm After 7 pm, please refer to night coverage provider listed on amion.

## 2023-08-08 NOTE — Progress Notes (Signed)
   08/08/23 2254  BiPAP/CPAP/SIPAP  Reason BIPAP/CPAP not in use Non-compliant (Pt states he can't tol without nasal pillows and he lost his)  BiPAP/CPAP /SiPAP Vitals  Pulse Rate 89  SpO2 99 %

## 2023-08-08 NOTE — Plan of Care (Signed)

## 2023-08-08 NOTE — Progress Notes (Signed)
 Occupational Therapy Treatment Patient Details Name: Francisco Padilla MRN: [id number] DOB: [birth year] Today's Date: 08/08/2023   History of present illness Pt is a 73 y/o male admitted for sepsis due to acute cholecystitis and AKI. Gangrenous cholecystitis s/p lap subtotal cholecystectomy 1/8.  PMH: OSA on CPAP, PAF, PVD, hypotension, HLD, GERD, BPH, Roux-en-Y gastric bypass ([name] 2008)   OT comments  Pt seen for first OT session s/p surgery and ICU stay w/ noted deficits in sitting/standing balance requiring cues and hands on assist to correct. Emphasis on AROM of BLE, flexibility exercises and continued mobility during admission to maximize independence. BP soft at baseline per pt, orthostatics negative. Based on functional decline since surgery, recommend continued inpatient follow up therapy, <3 hours/day at DC w/ pt agreeable to this plan.      If plan is discharge home, recommend the following:  Assistance with cooking/housework;A lot of help with walking and/or transfers;A lot of help with bathing/dressing/bathroom;Assist for transportation   Equipment Recommendations  Other (comment) (RW; TBD)    Recommendations for Other Services      Precautions / Restrictions Precautions Precautions: Fall Precaution Comments: JP drain RUQ. Montior HR and BP Restrictions Weight Bearing Restrictions Per Provider Order: No       Mobility Bed Mobility Overal bed mobility: Needs Assistance Bed Mobility: Supine to Sit, Sit to Supine     Supine to sit: Contact guard, HOB elevated, Used rails Sit to supine: Contact guard assist   General bed mobility comments: difficulty gaining balance EOB with CGA and cues to correct    Transfers Overall transfer level: Needs assistance Equipment used: 1 person hand held assist Transfers: Sit to/from Stand Sit to Stand: Min assist, From elevated surface           General transfer comment: Min A to stand with frequent posterior LOB back onto bed x  2-3 during session. unsteady taking steps at bedside     Balance Overall balance assessment: Needs assistance Sitting-balance support: Feet supported, Single extremity supported, No upper extremity supported Sitting balance-Leahy Scale: Fair Sitting balance - Comments: though posterior sway noted with CGA/cues to correct Postural control: Posterior lean Standing balance support: Bilateral upper extremity supported, Single extremity supported, During functional activity Standing balance-Leahy Scale: Poor                             ADL either performed or assessed with clinical judgement   ADL Overall ADL's : Needs assistance/impaired Eating/Feeding: Independent;Bed level Eating/Feeding Details (indicate cue type and reason): at end of session                                   General ADL Comments: Focus on EOB balance, orthostatic assessments and AROM BLE + flexibility exercises    Extremity/Trunk Assessment Upper Extremity Assessment Upper Extremity Assessment: Generalized weakness;Right hand dominant   Lower Extremity Assessment Lower Extremity Assessment: Defer to PT evaluation        Vision   Vision Assessment?: No apparent visual deficits   Perception     Praxis      Cognition Arousal: Alert Behavior During Therapy: WFL for tasks assessed/performed Overall Cognitive Status: Within Functional Limits for tasks assessed  General Comments: pleasant, tangential        Exercises      Shoulder Instructions       General Comments      Pertinent Vitals/ Pain       Pain Assessment Pain Assessment: Faces Faces Pain Scale: Hurts a little bit Pain Location: RUQ Pain Descriptors / Indicators: Sore, Grimacing Pain Intervention(s): Monitored during session  Home Living                                          Prior Functioning/Environment              Frequency   Min 1X/week        Progress Toward Goals  OT Goals(current goals can now be found in the care plan section)  Progress towards OT goals: OT to reassess next treatment  Acute Rehab OT Goals Patient Stated Goal: regain strength, go to [name] and rehab eventually OT Goal Formulation: With patient Time For Goal Achievement: 08/13/23 Potential to Achieve Goals: Good ADL Goals Pt Will Perform Lower Body Dressing: with modified independence;sitting/lateral leans;sit to/from stand Pt Will Transfer to Toilet: with modified independence;ambulating Additional ADL Goal #1: Pt to demo ability to gather ADL/IADL items with Modified Independence without LOB Additional ADL Goal #2: Pt to verbalize at least 3 fall prevention strategies to implement at home  Plan      Co-evaluation                 AM-PAC OT "6 Clicks" Daily Activity     Outcome Measure   Help from another person eating meals?: None Help from another person taking care of personal grooming?: A Little Help from another person toileting, which includes using toliet, bedpan, or urinal?: A Little Help from another person bathing (including washing, rinsing, drying)?: A Little Help from another person to put on and taking off regular upper body clothing?: A Little Help from another person to put on and taking off regular lower body clothing?: A Little 6 Click Score: 19    End of Session    OT Visit Diagnosis: Unsteadiness on feet (R26.81);Other abnormalities of gait and mobility (R26.89);Muscle weakness (generalized) (M62.81)   Activity Tolerance Patient tolerated treatment well   Patient Left in bed;with call bell/phone within reach;with bed alarm set   Nurse Communication Mobility status        Time: 1150-1230 OT Time Calculation (min): 40 min  Charges: OT General Charges $OT Visit: 1 Visit OT Treatments $Self Care/Home Management : 8-22 mins $Therapeutic Activity: 8-22 mins $Therapeutic Exercise: 8-22  mins  Lawrence Pretty, OTR/L Acute Rehab Services Office: [phone]   Annabella Barr 08/08/2023, 1:14 PM

## 2023-08-08 NOTE — Progress Notes (Signed)
 7 Days Post-Op  Subjective: Patient still with some RUQ abdominal pain.  Tolerating regular diet with no issues.  ROS: See above, otherwise other systems negative  Objective: Vital signs in last 24 hours: Temp:  [97.6 F (36.4 C)-98.4 F (36.9 C)] 98.4 F (36.9 C) (01/15 0745) Pulse Rate:  [82-91] 90 (01/15 0745) Resp:  [9-21] 17 (01/15 0745) BP: (90-103)/(66-75) 102/75 (01/15 0745) SpO2:  [99 %-100 %] 99 % (01/15 0745) Last BM Date : 08/06/23  Intake/Output from previous day: 01/14 0701 - 01/15 0700 In: 1234.2 [P.O.:960; I.V.:224.2; IV Piggyback:50] Out: 2518 [Urine:2050; Drains:468] Intake/Output this shift: Total I/O In: 120 [P.O.:120] Out: -   PE: Gen: NAD Abd: soft, tender in in RUQ today, JP with 468cc of murky bilious fluid in JP drain yesterday, bulb at least half full with same output today, incisions are c/d/i  Lab Results:  Recent Labs    08/07/23 0930 08/08/23 0629  WBC 17.3* 15.5*  HGB 11.0* 10.5*  HCT 33.0* 31.8*  PLT 309 295   BMET Recent Labs    08/06/23 0845 08/08/23 0630  NA 133* 137  K 3.5 3.6  CL 97* 100  CO2 28 30  GLUCOSE 127* 93  BUN 18 17  CREATININE 0.69 0.59*  CALCIUM  8.6* 8.2*   PT/INR No results for input(s): "LABPROT", "INR" in the last 72 hours. CMP     Component Value Date/Time   NA 137 08/08/2023 0630   K 3.6 08/08/2023 0630   CL 100 08/08/2023 0630   CO2 30 08/08/2023 0630   GLUCOSE 93 08/08/2023 0630   BUN 17 08/08/2023 0630   CREATININE 0.59 (L) 08/08/2023 0630   CREATININE 0.77 07/19/2023 1128   CALCIUM  8.2 (L) 08/08/2023 0630   PROT 4.5 (L) 08/08/2023 0630   ALBUMIN  1.8 (L) 08/08/2023 0630   AST 15 08/08/2023 0630   ALT 19 08/08/2023 0630   ALKPHOS 51 08/08/2023 0630   BILITOT 0.7 08/08/2023 0630   GFRNONAA >60 08/08/2023 0630   GFRAA  10/23/2009 0135    >60        The eGFR has been calculated using the MDRD equation. This calculation has not been validated in all clinical situations. eGFR's  persistently <60 mL/min signify possible Chronic Kidney Disease.   Lipase     Component Value Date/Time   LIPASE 29 07/29/2023 0014       Studies/Results: MR ABDOMEN MRCP W WO CONTAST Result Date: 08/08/2023 CLINICAL DATA:  Bile leak EXAM: MRI ABDOMEN WITHOUT AND WITH CONTRAST (INCLUDING MRCP) TECHNIQUE: Multiplanar multisequence MR imaging of the abdomen was performed both before and after the administration of intravenous contrast. Heavily T2-weighted images of the biliary and pancreatic ducts were obtained, and three-dimensional MRCP images were rendered by post processing. CONTRAST:  10mL GADAVIST  GADOBUTROL  1 MMOL/ML IV SOLN COMPARISON:  CT abdomen/pelvis dated 07/29/2023. FINDINGS: Motion degraded images. Lower chest: Small bilateral pleural effusions. Hepatobiliary: Liver is within normal limits. Status post subtotal cholecystectomy with residual gallbladder lumen and soft tissue debris in the surgical bed. Associated 9.2 x 6.1 x 10.8 cm fluid collection extending from the gallbladder fossa (series 6/image 31) to the inferior right perihepatic space (series 2/image 29). No intrahepatic or extrahepatic ductal dilatation. Common duct measures 5 mm, without discontinuity on MR. No choledocholithiasis is seen. Pancreas:  Within normal limits. Spleen:  Within normal limits. Adrenals/Urinary Tract:  Adrenal glands are within normal limits. Bilateral simple renal cysts, measuring up to 4.7 cm in the anterior left lower  kidney (series 3/image 32), benign (Bosniak I). No follow-up is recommended. No hydronephrosis. Stomach/Bowel: Stomach is within normal limits. Visualized bowel is grossly unremarkable. Vascular/Lymphatic:  No evidence of aortic aneurysm. No suspicious abdominal lymphadenopathy. Other:  No abdominal ascites. Musculoskeletal: No focal osseous lesions. IMPRESSION: Status post subtotal cholecystectomy with residual gallbladder lumen and soft tissue debris in the surgical bed. Associated  10.8 cm fluid collection extending from the gallbladder fossa to the inferior right perihepatic space. No intrahepatic or extrahepatic ductal dilatation. Common duct measures 5 mm, without discontinuity on MR. No choledocholithiasis is seen. Small bilateral pleural effusions. Electronically Signed   By: Zadie Herter M.D.   On: 08/08/2023 03:10   MR 3D Recon At Scanner Result Date: 08/08/2023 CLINICAL DATA:  Bile leak EXAM: MRI ABDOMEN WITHOUT AND WITH CONTRAST (INCLUDING MRCP) TECHNIQUE: Multiplanar multisequence MR imaging of the abdomen was performed both before and after the administration of intravenous contrast. Heavily T2-weighted images of the biliary and pancreatic ducts were obtained, and three-dimensional MRCP images were rendered by post processing. CONTRAST:  10mL GADAVIST  GADOBUTROL  1 MMOL/ML IV SOLN COMPARISON:  CT abdomen/pelvis dated 07/29/2023. FINDINGS: Motion degraded images. Lower chest: Small bilateral pleural effusions. Hepatobiliary: Liver is within normal limits. Status post subtotal cholecystectomy with residual gallbladder lumen and soft tissue debris in the surgical bed. Associated 9.2 x 6.1 x 10.8 cm fluid collection extending from the gallbladder fossa (series 6/image 31) to the inferior right perihepatic space (series 2/image 29). No intrahepatic or extrahepatic ductal dilatation. Common duct measures 5 mm, without discontinuity on MR. No choledocholithiasis is seen. Pancreas:  Within normal limits. Spleen:  Within normal limits. Adrenals/Urinary Tract:  Adrenal glands are within normal limits. Bilateral simple renal cysts, measuring up to 4.7 cm in the anterior left lower kidney (series 3/image 32), benign (Bosniak I). No follow-up is recommended. No hydronephrosis. Stomach/Bowel: Stomach is within normal limits. Visualized bowel is grossly unremarkable. Vascular/Lymphatic:  No evidence of aortic aneurysm. No suspicious abdominal lymphadenopathy. Other:  No abdominal ascites.  Musculoskeletal: No focal osseous lesions. IMPRESSION: Status post subtotal cholecystectomy with residual gallbladder lumen and soft tissue debris in the surgical bed. Associated 10.8 cm fluid collection extending from the gallbladder fossa to the inferior right perihepatic space. No intrahepatic or extrahepatic ductal dilatation. Common duct measures 5 mm, without discontinuity on MR. No choledocholithiasis is seen. Small bilateral pleural effusions. Electronically Signed   By: Zadie Herter M.D.   On: 08/08/2023 03:10     Anti-infectives: Anti-infectives (From admission, onward)    Start     Dose/Rate Route Frequency Ordered Stop   08/07/23 1100  piperacillin -tazobactam (ZOSYN ) IVPB 3.375 g        3.375 g 12.5 mL/hr over 240 Minutes Intravenous Every 8 hours 08/07/23 1008     08/07/23 0000  piperacillin -tazobactam (ZOSYN ) 3.375 GM/50ML IVPB        3.375 g Intravenous Every 8 hours 08/07/23 1501     07/29/23 0800  piperacillin -tazobactam (ZOSYN ) IVPB 3.375 g        3.375 g 12.5 mL/hr over 240 Minutes Intravenous Every 8 hours 07/29/23 0207 08/06/23 2359   07/29/23 0030  piperacillin -tazobactam (ZOSYN ) IVPB 3.375 g        3.375 g 100 mL/hr over 30 Minutes Intravenous  Once 07/29/23 0020 07/29/23 0147        Assessment/Plan POD 7, s/p subtotal cholecystectomy for gangrenous cholecystitis, Dr. Davonna Estes 1/8 -cont regular diet -cont abx therapy.  WBC down to 15K today -LFTs were normal -drain output  remains thick, murky, bilious output with 470cc of output yesterday. - I contacted [name], [facility], and [facility] yesterday on behalf of the patient and his request to transfer as well as need for possible transfer due to ERCP difficulties in the setting of bypass anatomy. -[name] declined due to capacity.  [facility] was reviewing his case and the patient then refused this location so request for transfer was withdrawn.  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  They also have no beds and unclear at what point that may change to even accept the patient.  Their recommendation was to have IR place a PTC drain so this stents his CBD and essentially accomplishes the same goal in a safe technique. -MRCP was ordered last night and reviewed to help evaluate his anatomy for possible procedures.  This revealed an undrained fluid collection that the surgical drain traverses, but is unable to completely evacuate.  Otherwise fairly normal MRCP -I have discussed his imaging and case with IR who would be agreeable to place a perc drain for the fluid collection and attempt PTC drain as they feel this is also a safe way to attempt to manage this situation. -I had at least a 45 minute long conversation with the patient relaying all of the above information.  I drew pictures of his anatomy and pictures to help understand these procedures that are being offered and had a lengthy discussion with why this is being recommended and [facility]'s concern for more invasive procedure/complications etc.  I repeated numerous times that our goal as his surgical team is to continue to pursue the safest treatment options for the patient that will get him better and achieve his goal of improvement.  The patient was given all these options and also given the option to just keep his drain and manage his bile leak conservatively with his drain and set up and outpatient follow up with [name], which is his first choice of where to be treated.  He turned that recommendation down as that would require driving to the appointments and he states he can't do that. -ultimately, the patient has refused all of the above options that we have recommended stating, "I'm just the patient.  I don't understand all of this and can't make these decisions."  I asked what needs more explanation as I was happy to go over it again and he became frustrated saying I had  told him the same explanation 4 times and he doesn't want to hear it anymore.  At this time he also requested another provider from my service to see him.  I let him know that for right now, Dr. Camilo Cella and I are the only ones covering this service.  At this time, I have let IR know that for now the patient has refused perc drain for the fluid collection as well as the PTC drain placement.   -I have returned the patient to a regular diet and for now, we will just continue to manage him with his surgical drain until he gives further indication and direction into what he would like to do. -cont to hold Eliquis  and continue heparin  in case he agrees to procedures moving forward   FEN - regular VTE - hold eliquis  and resume heparin  gtt ID - Zosyn , cont at this time given WBC still elevated and murky bilious leak  A fib AKI Peripheral neuropathy  GERD HLD Hx of gastric bypass   LOS: 10 days    Leone Ralphs , [facility] 08/08/2023, 9:29 AM Please see Amion for pager number during day hours 7:00am-4:30pm or 7:00am -11:30am on weekends

## 2023-08-08 NOTE — Progress Notes (Signed)
 PHARMACY - ANTICOAGULATION Pharmacy Consult for Heparin  Indication: atrial fibrillation (on Apixaban  prior to admission> now on hold)  Allergies  Allergen Reactions   Aspirin     Avoid due to bariatric surgery   Bupropion Other (See Comments)    "Passed out"   Nsaids     Avoid due to bariatric surgery   Oxycodone  Hcl     Hallucinations, agitation     Patient Measurements: Height: 6\' 4"  (193 cm) Weight: 93.6 kg (206 lb 5.6 oz) IBW/kg (Calculated) : 86.8 Heparin  Dosing Weight: 93 kg  Vital Signs: Temp: 97.9 F (36.6 C) (01/15 1612) Temp Source: Oral (01/15 1612) BP: 94/68 (01/15 1612) Pulse Rate: 96 (01/15 1612)  Labs: Recent Labs    08/06/23 0845 08/06/23 1506 08/07/23 0840 08/07/23 0914 08/07/23 0930 08/07/23 1530 08/08/23 0629 08/08/23 0630 08/08/23 0944 08/08/23 1622  HGB 11.0*  --  11.6*  --  11.0*  --  10.5*  --   --   --   HCT 32.6*  --  35.0*  --  33.0*  --  31.8*  --   --   --   PLT 288  --  311  --  309  --  295  --   --   --   APTT  --   --   --   --  31  --   --   --  70* 96*  HEPARINUNFRC  --    < >  --    < >  --  0.96*  --   --  0.36 0.40  CREATININE 0.69  --   --   --   --   --   --  0.59*  --   --    < > = values in this interval not displayed.    Estimated Creatinine Clearance: 104 mL/min (A) (by C-G formula based on SCr of 0.59 mg/dL (L)).  Assessment: 73 y.o. male . Patine wason Eliquis  pta for PAF. Last dose given 1/3. Transitioned to IV Heparin  which was stopped 1/8 AM for cholecystectomy. Held throughout post-operative day 0 (1/9) due to bleeding loss during surgery. Pharmacy consulted to resume IV Heparin   on 08/03/23 AM - no bolus. Pt continues on heparin  s/p cholecystectomy 08/01/23. Patient transitioned back to apixaban  1/14 but patient with increased abdominal pain. Switched back to heparin  for possible procedure. Apixaban  last given 1/14 0818.   Heparin  level 0.4 and aPTT 96 are somewhat correlating and therapeutic on 2700 units/hr. Will  follow heparin  levels.    Goal of Therapy:  Heparin  level 0.3-0.7 units/mL aPTT 66-102 seconds Monitor platelets by anticoagulation protocol: Yes   Plan:  Continue IV Heparin  at 2700 units/hr   Monitor daily heparin  level, CBC, signs/symptoms of bleeding   Thank you for allowing pharmacy to be part of this patients care team.  Dorene Gang, PharmD, BCPS, BCCP Clinical Pharmacist  Please check AMION for all [facility] Pharmacy phone numbers After 10:00 PM, call Main Pharmacy [phone]

## 2023-08-08 NOTE — Progress Notes (Signed)
 Physical Therapy Treatment Patient Details Name: Francisco Padilla MRN: [id number] DOB: 09/13/1950 Today's Date: 08/08/2023   History of Present Illness Pt is a 73 y/o male admitted for sepsis due to acute cholecystitis and AKI. Gangrenous cholecystitis s/p lap subtotal cholecystectomy 1/8.  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 Pt was able to transfer to 3N1 and to recliner with min assist of 2 persons.  Pt with fluctuating BP and HR throughout session limiting progression and did not ambulate due to this.  Will continue to follow acutely.  Pt currently with functional limitations due to the deficits listed below (see PT Problem List). Pt will benefit from acute skilled PT to increase their independence and safety with mobility to allow discharge.      Orthostatic BPs  Supine 87/53, 93bpm  Sitting 86/73, 130 bpm  Standing 81/34, 140 bpm  Standing after 3 min 83/61, 145 bpm  BP end of session 83/63, HR 106 bpm If plan is discharge home, recommend the following: Assistance with cooking/housework;Direct supervision/assist for medications management;Direct supervision/assist for financial management;Assist for transportation;Supervision due to cognitive status;Help with stairs or ramp for entrance;A lot of help with walking and/or transfers;A lot of help with bathing/dressing/bathroom   Can travel by private vehicle     No  Equipment Recommendations  Rolling walker (2 wheels);BSC/3in1    Recommendations for Other Services       Precautions / Restrictions Precautions Precautions: Fall Precaution Comments: JP drain RUQ. Montior HR and BP Restrictions Weight Bearing Restrictions Per Provider Order: No     Mobility  Bed Mobility Overal bed mobility: Needs Assistance Bed Mobility: Supine to Sit, Sit to Supine     Supine to sit: Contact guard, HOB elevated, Used rails Sit to supine:  Contact guard assist   General bed mobility comments: difficulty gaining balance EOB with CGA and cues to correct    Transfers Overall transfer level: Needs assistance Equipment used: Rolling walker (2 wheels) Transfers: Sit to/from Stand, Bed to chair/wheelchair/BSC Sit to Stand: Min assist, From elevated surface, +2 safety/equipment Stand pivot transfers: Min assist, +2 safety/equipment Step pivot transfers: Min assist, +2 safety/equipment       General transfer comment: Min A to stand with frequent posterior LOB back onto bed x 2-3 during session. unsteady taking steps at bedside.  Pt reported he needed to use 3N1 therefore obtained 3n1 and pt was having BM just prior to getting onto it.  Pt stood x 1 after starting BM and stated that he needed to go more. Cleaned pt partially with total assist. Then pt stood again and took a few steps to recliner.  Each attempt at moving, pts HR incr with highest HR at 160 bpm when being cleaned in standing.  Pt HR would recover as soon as he was sitting.  BP soft as well therefore did not ambulate pt.    Ambulation/Gait                   Stairs             Wheelchair Mobility     Tilt Bed    Modified Rankin (Stroke Patients Only)       Balance Overall balance assessment: Needs assistance Sitting-balance support: Feet supported, Single extremity supported, No upper extremity supported Sitting balance-Leahy Scale: Fair     Standing balance support: Bilateral upper extremity supported, Single extremity  supported, During functional activity Standing balance-Leahy Scale: Poor Standing balance comment: Stand with RW for support, posterior instability.                            Cognition Arousal: Alert Behavior During Therapy: WFL for tasks assessed/performed Overall Cognitive Status: Within Functional Limits for tasks assessed Area of Impairment: Memory, Problem solving                     Memory:  Decreased short-term memory       Problem Solving: Slow processing General Comments: pleasant, tangential        Exercises General Exercises - Lower Extremity Hip Flexion/Marching: AROM, Both, 10 reps, Standing    General Comments        Pertinent Vitals/Pain Pain Assessment Faces Pain Scale: Hurts even more Pain Location: right UQ Pain Descriptors / Indicators: Sore, Grimacing Pain Intervention(s): Limited activity within patient's tolerance, Monitored during session, Repositioned    Home Living                          Prior Function            PT Goals (current goals can now be found in the care plan section) Progress towards PT goals: Progressing toward goals    Frequency    Min 1X/week      PT Plan      Co-evaluation              AM-PAC PT "6 Clicks" Mobility   Outcome Measure  Help needed turning from your back to your side while in a flat bed without using bedrails?: A Little Help needed moving from lying on your back to sitting on the side of a flat bed without using bedrails?: A Little Help needed moving to and from a bed to a chair (including a wheelchair)?: A Lot Help needed standing up from a chair using your arms (e.g., wheelchair or bedside chair)?: A Lot Help needed to walk in hospital room?: A Lot Help needed climbing 3-5 steps with a railing? : Total 6 Click Score: 13    End of Session Equipment Utilized During Treatment: Gait belt Activity Tolerance: Patient limited by fatigue (incr HR and low BP) Patient left: with call bell/phone within reach;in chair;with chair alarm set Nurse Communication: Mobility status;Other (comment) (BP and HR as well as pt asked for pain meds) PT Visit Diagnosis: Other abnormalities of gait and mobility (R26.89);Muscle weakness (generalized) (M62.81);Unsteadiness on feet (R26.81);Difficulty in walking, not elsewhere classified (R26.2);Dizziness and giddiness (R42)     Time: 0981-1914 PT  Time Calculation (min) (ACUTE ONLY): 54 min  Charges:    $Therapeutic Activity: 38-52 mins $Self Care/Home Management: 8-22 PT General Charges $$ ACUTE PT VISIT: 1 Visit                     Garima Chronis M,PT Acute Rehab Services [phone]    Florencia Hunter 08/08/2023, 3:48 PM

## 2023-08-08 NOTE — Progress Notes (Signed)
 PHARMACY - ANTICOAGULATION Pharmacy Consult for Heparin  Indication: atrial fibrillation (on Apixaban  prior to admission> now on hold)  Allergies  Allergen Reactions   Aspirin     Avoid due to bariatric surgery   Bupropion Other (See Comments)    "Passed out"   Nsaids     Avoid due to bariatric surgery   Oxycodone  Hcl     Hallucinations, agitation     Patient Measurements: Height: 6\' 4"  (193 cm) Weight: 93.6 kg (206 lb 5.6 oz) IBW/kg (Calculated) : 86.8 Heparin  Dosing Weight: 93 kg  Vital Signs: Temp: 98.4 F (36.9 C) (01/15 0745) Temp Source: Oral (01/15 0745) BP: 102/75 (01/15 0745) Pulse Rate: 90 (01/15 0745)  Labs: Recent Labs    08/06/23 0845 08/06/23 1506 08/07/23 0840 08/07/23 0914 08/07/23 0930 08/07/23 1530 08/08/23 0629 08/08/23 0630 08/08/23 0944  HGB 11.0*  --  11.6*  --  11.0*  --  10.5*  --   --   HCT 32.6*  --  35.0*  --  33.0*  --  31.8*  --   --   PLT 288  --  311  --  309  --  295  --   --   APTT  --   --   --   --  31  --   --   --  70*  HEPARINUNFRC  --    < >  --  0.52  --  0.96*  --   --  0.36  CREATININE 0.69  --   --   --   --   --   --  0.59*  --    < > = values in this interval not displayed.    Estimated Creatinine Clearance: 104 mL/min (A) (by C-G formula based on SCr of 0.59 mg/dL (L)).  Assessment: 73 y.o. male . Patine wason Eliquis  pta for PAF. Last dose given 1/3. Transitioned to IV Heparin  which was stopped 1/8 AM for cholecystectomy. Held throughout post-operative day 0 (1/9) due to bleeding loss during surgery. Pharmacy consulted to resume IV Heparin   on 08/03/23 AM - no bolus. Pt continues on heparin  pending plans / need for ERCP.   s/p cholecystectomy 08/01/23  08/07/23:  Heparin  was stopped and transitioned back to his Apixaban  (PTA) 5mg  bid yesterday /13/25 PM. This morning , surgery notes patient having increasing abdominal pain in RUQ overnight and JP drain has returned to bilious/murky output. Surgery team communicating  with [name] for adv GI involvement for ERCP given bypass anatomy. Apixaban  held and resuming IV heparin  08/07/23.   Pharmacy consulted to restart IV heparin  infusion on 1/14 after patient having increasing abdominal pain in RUQ overnight and JP drain had returned to bilious/murky output.    Update1/15: Heparin  was resumed last night 1/14 ~ 20:30, then heparin  drip held at 00:30 for MRI of abd.  Heparin  restarted at 02:15 this morning post MRI abd.  No issues with infusion per RN report this morning. Currently on heparin  infusion rate at 2700 units/hr.  7 hour aPTT =70, HL 0.36, both are therapeutic, and appear to be correlating (after 2 doses of apixaban  (restart of pta med 1/13pm, last given 1/14 0818). CBC: Hgb stable in 10s-11s, Plt 200s-300s. No active bleeding reported.     Goal of Therapy:  Heparin  level 0.3-0.7 units/mL aPTT =66-102 seconds Monitor platelets by anticoagulation protocol: Yes   Plan:  Continue IV Heparin  at 2700 units/hr  Check 6-8hr aPTT/HL to confirm therapeutic Then daily HL if aPTT  and HL correlate, and daily CBC.  Thank you for allowing pharmacy to be part of this patients care team.  Alisa Irish, RPh Clinical Pharmacist  08/08/2023 11:43 AM

## 2023-08-09 ENCOUNTER — Ambulatory Visit (HOSPITAL_COMMUNITY): Payer: Medicare Other

## 2023-08-09 DIAGNOSIS — K810 Acute cholecystitis: Secondary | ICD-10-CM | POA: Diagnosis not present

## 2023-08-09 DIAGNOSIS — N179 Acute kidney failure, unspecified: Secondary | ICD-10-CM | POA: Diagnosis not present

## 2023-08-09 DIAGNOSIS — I4891 Unspecified atrial fibrillation: Secondary | ICD-10-CM | POA: Diagnosis not present

## 2023-08-09 DIAGNOSIS — I350 Nonrheumatic aortic (valve) stenosis: Secondary | ICD-10-CM | POA: Diagnosis not present

## 2023-08-09 LAB — COMPREHENSIVE METABOLIC PANEL
ALT: 19 U/L (ref 0–44)
AST: 16 U/L (ref 15–41)
Albumin: 1.7 g/dL — ABNORMAL LOW (ref 3.5–5.0)
Alkaline Phosphatase: 55 U/L (ref 38–126)
Anion gap: 8 (ref 5–15)
BUN: 14 mg/dL (ref 8–23)
CO2: 29 mmol/L (ref 22–32)
Calcium: 8.1 mg/dL — ABNORMAL LOW (ref 8.9–10.3)
Chloride: 100 mmol/L (ref 98–111)
Creatinine, Ser: 0.61 mg/dL (ref 0.61–1.24)
GFR, Estimated: >60 mL/min (ref >60)
Glucose, Bld: 97 mg/dL (ref 70–99)
Potassium: 3.4 mmol/L — ABNORMAL LOW (ref 3.5–5.1)
Sodium: 137 mmol/L (ref 135–145)
Total Bilirubin: 0.7 mg/dL (ref 0.0–1.2)
Total Protein: 4.5 g/dL — ABNORMAL LOW (ref 6.5–8.1)

## 2023-08-09 LAB — CBC
HCT: 29.7 % — ABNORMAL LOW (ref 39.0–52.0)
Hemoglobin: 10 g/dL — ABNORMAL LOW (ref 13.0–17.0)
MCH: 31.3 pg (ref 26.0–34.0)
MCHC: 33.7 g/dL (ref 30.0–36.0)
MCV: 92.8 fL (ref 80.0–100.0)
Platelets: 295 10*3/uL (ref 150–400)
RBC: 3.2 MIL/uL — ABNORMAL LOW (ref 4.22–5.81)
RDW: 13.1 % (ref 11.5–15.5)
WBC: 13.5 10*3/uL — ABNORMAL HIGH (ref 4.0–10.5)
nRBC: 0 % (ref 0.0–0.2)

## 2023-08-09 LAB — PROTIME-INR
INR: 1.1 (ref 0.8–1.2)
Prothrombin Time: 14.9 s (ref 11.4–15.2)

## 2023-08-09 LAB — HEPARIN LEVEL (UNFRACTIONATED): Heparin Unfractionated: 0.43 [IU]/mL (ref 0.30–0.70)

## 2023-08-09 MED ORDER — SODIUM CHLORIDE 0.9 % IV SOLN: 2.0000 g | INTRAVENOUS | Status: DC

## 2023-08-09 MED ORDER — HEPARIN (PORCINE) 25000 UT/250ML-% IV SOLN
2700.0000 [IU]/h | INTRAVENOUS | Status: AC
  Administered 2023-08-09 – 2023-08-10 (×2): 2700 [IU]/h via INTRAVENOUS
  Filled 2023-08-09 (×2): qty 250

## 2023-08-09 MED ORDER — POTASSIUM CHLORIDE CRYS ER 20 MEQ PO TBCR
40.0000 meq | EXTENDED_RELEASE_TABLET | Freq: Once | ORAL | Status: AC
  Administered 2023-08-09: 40 meq via ORAL
  Filled 2023-08-09: qty 2

## 2023-08-09 MED ORDER — ZINC OXIDE 40 % EX OINT
TOPICAL_OINTMENT | Freq: Three times a day (TID) | CUTANEOUS | Status: DC | PRN
  Filled 2023-08-09: qty 57

## 2023-08-09 MED ORDER — BARRIER CREAM NON-SPECIFIED
1.0000 | TOPICAL_CREAM | Freq: Three times a day (TID) | TOPICAL | Status: DC | PRN
Start: 2023-08-09 — End: 2023-08-09

## 2023-08-09 MED ORDER — JUVEN PO PACK
1.0000 | PACK | Freq: Two times a day (BID) | ORAL | Status: DC
  Administered 2023-08-10 – 2023-08-17 (×15): 1 via ORAL
  Administered 2023-08-18 – 2023-08-19 (×2): 2 via ORAL
  Administered 2023-08-19 – 2023-08-25 (×13): 1 via ORAL
  Filled 2023-08-09 (×29): qty 1

## 2023-08-09 NOTE — TOC Progression Note (Addendum)
Transition of Care [facility]) - Progression Note    Patient Details  Name: Francisco Padilla MRN: [id number] Date of Birth: [birth year]  Transition [facility] CM/SW Contact  Michaela Corner, [place] Phone Number: 08/09/2023, 11:03 AM  Clinical Narrative:  CSW left VM for [facility] and sent updated referral to [facility] regarding short term rehab placement.   3:25PM: CSW spoke with [facility] admissions, about pts ambulation on 1/13 in PT note. CSW stated that ambulation was not tested in recent therapy note. Herbert Seta stated that pt would need a skillable need to go to SNF and asked CSW for updated referral. CSW sent updated SNF referral at this time.    Pt uses home CPAP.   TOC will continue to follow.      Expected Discharge Plan: Home w Home Health Services Barriers to Discharge: Continued Medical Work up  Expected Discharge Plan and Services In-house Referral: NA Discharge Planning Services: CM Consult Post Acute Care Choice: NA Living arrangements for the past 2 months: Single Family Home                 DME Arranged: N/A DME Agency: NA       HH Arranged: NA           Social Determinants of Health (SDOH) Interventions SDOH Screenings   Food Insecurity: No Food Insecurity (07/29/2023)  Housing: Low Risk  (07/29/2023)  Transportation Needs: No Transportation Needs (07/29/2023)  Utilities: Not At Risk (07/29/2023)  Social Connections: Patient Declined (07/31/2023)  Tobacco Use: Low Risk  (08/01/2023)    Readmission Risk Interventions     No data to display

## 2023-08-09 NOTE — NC FL2 (Signed)
[place] MEDICAID FL2 LEVEL OF CARE FORM     IDENTIFICATION  Patient Name: Francisco Padilla Birthdate: Sep 30, 1950 Sex: male Admission Date (Current Location): 07/28/2023  [facility] and [place] Number:  Producer, television/film/video and Address:  [facility] [place], [address], [place], [place] [address]      Provider Number: [id number]  Attending Physician Name and Address:  Cathren Harsh, MD  Relative Name and Phone Number:       Current Level of Care: Hospital Recommended Level of Care: Skilled Nursing Facility Prior Approval Number:    Date Approved/Denied:   PASRR Number: [id number]  Discharge Plan: SNF    Current Diagnoses: Patient Active Problem List   Diagnosis Date Noted   Protein-calorie malnutrition, severe 08/04/2023   Bile leak 08/03/2023   Abnormal CT lung screening 08/03/2023   Atrial fibrillation with RVR (HCC) 08/03/2023   Chronic hypotension 08/01/2023   Aortic stenosis, severe 07/31/2023   Acute cholecystitis 07/29/2023   Hepatitis 07/29/2023   AKI (acute kidney injury) (HCC) 07/29/2023   GAD (generalized anxiety disorder) 07/29/2023   Sepsis (HCC) 07/29/2023   Memory loss 02/28/2023   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   Total knee replacement status, 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   Unilateral primary osteoarthritis, left knee    Longstanding persistent atrial fibrillation (HCC) 04/26/2021   Preop cardiovascular exam 04/26/2021   History of chronic hypotension 02/20/2019   Current use of long term anticoagulation 03/05/2015   Abnormal chest x-ray with multiple lung nodules 12/14/2014   AS (aortic stenosis) 12/14/2014   Atrial flutter by electrocardiogram (HCC) 12/14/2014   CAD (coronary artery disease) 12/14/2014   DJD (degenerative joint disease) 12/14/2014   History of GI bleed 12/14/2014   Nephrolithiasis 12/14/2014   Other activity(E029.9) 12/14/2014   Prediabetes 12/14/2014   Thrombocytopenia (HCC) 12/14/2014   Arterial  blood pressure decreased 10/28/2014   Atrial fibrillation (HCC) 09/02/2014   Morbid obesity (HCC) 09/02/2014   Paroxysmal atrial fibrillation (HCC) 08/03/2014   Gastroduodenal ulcer 08/03/2014   Apnea, sleep 08/03/2014   Glaucoma suspect 02/21/2013   Glaucoma suspect of both eyes 02/21/2013   Herpes 04/11/2012   Routine general medical examination at a health care facility 04/11/2012   Hyperlipidemia 05/31/2009   Depression 05/31/2009   Attention deficit disorder 05/31/2009   OSA on CPAP 05/31/2009   GERD 05/31/2009   GOUT, HX OF 05/31/2009    Orientation RESPIRATION BLADDER Height & Weight     Self, Time, Situation, Place  Normal Continent (pericare) Weight: 205 lb 0.4 oz (93 kg) Height:  6\' 4"  (193 cm)  BEHAVIORAL SYMPTOMS/MOOD NEUROLOGICAL BOWEL NUTRITION STATUS      Continent Diet (see dc summary)  AMBULATORY STATUS COMMUNICATION OF NEEDS Skin   Limited Assist Verbally Other (Comment), PU Stage and Appropriate Care (Incision - 3 Ports Abdomen 1, Right 2: Mid, Upper 3: Umbilicus; Incision Abdomen Other; Incision-  Abdomen Other; Pressure Injury Buttocks Right Stage 2)                       Personal Care Assistance Level of Assistance  Bathing, Feeding, Dressing Bathing Assistance: Limited assistance Feeding assistance: Independent Dressing Assistance: Limited assistance     Functional Limitations Info  Sight, Hearing, Speech Sight Info: Adequate Hearing Info: Adequate Speech Info: Adequate    SPECIAL CARE FACTORS FREQUENCY  PT (By licensed PT), OT (By licensed OT)     PT Frequency: 5x week OT Frequency: 5x week  Contractures Contractures Info: Not present    Additional Factors Info  Code Status, Allergies Code Status Info: FULL Allergies Info: Aspirin,  Bupropion, Nsaids, Oxycodone Hcl           Current Medications (08/09/2023):  This is the current hospital active medication list Current Facility-Administered Medications  Medication  Dose Route Frequency Provider Last Rate Last Admin   acetaminophen (TYLENOL) tablet 650 mg  650 mg Oral Q4H Lidia Collum, PA-C   650 mg at 08/09/23 5621   Or   acetaminophen (TYLENOL) suppository 650 mg  650 mg Rectal Q4H Pia Mau D, PA-C       acidophilus (RISAQUAD) capsule 1 capsule  1 capsule Oral Daily Tyrone Nine, MD   1 capsule at 08/09/23 0901   alfuzosin (UROXATRAL) 24 hr tablet 10 mg  10 mg Oral Q breakfast Tyrone Nine, MD   10 mg at 08/08/23 0815   calcium carbonate (TUMS - dosed in mg elemental calcium) chewable tablet 200 mg of elemental calcium  200 mg of elemental calcium Oral TID Jerald Kief, MD   200 mg of elemental calcium at 08/08/23 2104   Chlorhexidine Gluconate Cloth 2 % PADS 6 each  6 each Topical Daily Tyrone Nine, MD   6 each at 08/09/23 3086   docusate sodium (COLACE) capsule 100 mg  100 mg Oral BID Moise Boring, MD   100 mg at 08/08/23 2104   feeding supplement (ENSURE ENLIVE / ENSURE PLUS) liquid 237 mL  237 mL Oral BID BM Jerald Kief, MD   237 mL at 08/08/23 1658   fludrocortisone (FLORINEF) tablet 0.1 mg  0.1 mg Oral Daily Sundil, Subrina, MD   0.1 mg at 08/09/23 0901   gabapentin (NEURONTIN) capsule 600 mg  600 mg Oral QHS Hazeline Junker B, MD   600 mg at 08/08/23 2102   HYDROmorphone (DILAUDID) injection 0.5-1 mg  0.5-1 mg Intravenous Q4H PRN Pia Mau D, PA-C   1 mg at 08/09/23 0655   latanoprost (XALATAN) 0.005 % ophthalmic solution 1 drop  1 drop Both Eyes QHS Hazeline Junker B, MD   1 drop at 08/08/23 2105   liver oil-zinc oxide (DESITIN) 40 % ointment   Topical TID PRN Leander Rams, RPH       LORazepam (ATIVAN) tablet 1 mg  1 mg Oral Q4H PRN Janalyn Shy, Subrina, MD   1 mg at 08/09/23 0908   midodrine (PROAMATINE) tablet 5 mg  5 mg Oral TID WC Mannam, Praveen, MD   5 mg at 08/09/23 5784   multivitamin with minerals tablet 1 tablet  1 tablet Oral BID Jerald Kief, MD   1 tablet at 08/09/23 0901   pantoprazole (PROTONIX) EC tablet 40 mg  40 mg  Oral Daily Sundil, Subrina, MD   40 mg at 08/09/23 0901   piperacillin-tazobactam (ZOSYN) IVPB 3.375 g  3.375 g Intravenous Q8H Tamera Reason, RPH 12.5 mL/hr at 08/09/23 0906 3.375 g at 08/09/23 0906   polyethylene glycol (MIRALAX / GLYCOLAX) packet 17 g  17 g Oral Daily Moise Boring, MD   17 g at 08/08/23 6962   senna-docusate (Senokot-S) tablet 1 tablet  1 tablet Oral QHS PRN Tereasa Coop, MD   1 tablet at 08/03/23 0846   simethicone (MYLICON) chewable tablet 80 mg  80 mg Oral QID PRN Darlin Drop, DO   80 mg at 08/03/23 2112   sodium chloride flush (NS) 0.9 % injection 3 mL  3 mL Intravenous Q12H Sundil, Subrina, MD   3 mL at 08/09/23 0904   venlafaxine XR (EFFEXOR-XR) 24 hr capsule 37.5 mg  37.5 mg Oral Q breakfast Sundil, Subrina, MD   37.5 mg at 08/08/23 0815   zolpidem (AMBIEN) tablet 5 mg  5 mg Oral QHS PRN Janalyn Shy Subrina, MD   5 mg at 08/07/23 2221     Discharge Medications: Please see discharge summary for a list of discharge medications.  Relevant Imaging Results:  Relevant Lab Results:   Additional Information SSN: [id number]  Michaela Corner, LCSWA

## 2023-08-09 NOTE — Consult Note (Signed)
Chief Complaint: Patient was seen in consultation today for fluid collection s/p cholecystectomy 08/01/23   Procedure: Percutaneous Transhepatic cholangiogram with possible biliary drain and biloma drain placement  Referring Physician(s): Barnetta Chapel, PA-C  Supervising Physician: Marliss Coots  Patient Status: [facility] - In-pt  History of Present Illness: Francisco Padilla is a 73 y.o. male with a history of OSA on CPAP, PVD, peripheral neuropathy, HLD, A fib on Eliquis, GERD, and history of bariatric surgery who initially presented to the [facility] ED via EMS for generalized weakness/fatigue for 1 month. Also with complaints of nausea, diarrhea, poor appetite x 1 month and progressively worsening RUQ pain, particularly with inspiration for the previous 3 days. CT A/P on 07/29/23 revealed 'cholelithiasis w/ gallbladder wall thickening and surrounding inflammation concerning for acute cholecystitis.' Surgery consulted for lap chole; however patient on Eliquis so initially admitted to medicine for IV abx. Lap chole with ICG dye performed 08/01/23 with Dr. Wilber Oliphant. Initially doing well post-op with drain in place. However, now with increased amount of bilious/murky output from JP drain. MR Abdomen on 08/08/23 significant for 'subtotal cholecystectomy w/ residual gallbladder lumen and soft tissue debris in the surgical bed. Associated 10.8cm fluid collection extending from the gallbladder fossa to inferior right perihepatic space. CBD measuring 5mm.' Pt was considering transfer to [facility] or [facility] for continued care, but upon discussion with surgery team and personal friend who is a physician he became agreeable to stay for less invasive attempts at accessing biliary system. IR consulted for percutaneous cholangiogram with possible biliary drain and biloma drain placement.   Pleasant, chatty gentleman resting comfortably in bed on arrival to room. Patient admits to moderate abdominal pain in the RUQ. Otherwise he has no  other complaints. He has been NPO since midnight. Nurse stopped Heparin infusion around 10:35am while we were speaking. Remains afebrile. WBC 13.5 down from 15.5 08/08/23. INR pending. Case reviewed and approved by Dr. Andrey Campanile.   Code Status: Full code  Past Medical History:  Diagnosis Date   ADD (attention deficit disorder)    Anxiety 2007   Aortic stenosis    contributor to low systolic BP   Asthma as child   Asymptomatic gallstones    Atrial fibrillation (HCC)    Atrial flutter (HCC)    Chronic hypotension    Per Pt historical systolic BP in 80's   Depression    DJD (degenerative joint disease) of knee    Dysrhythmia    Chronic Atrial Fib- seeing Dr. [facility]   H/O peptic ulcer    past history with GI bleed- cauterization done throught scope   History of kidney stones    pt claims urology discovered a kidney stone last week 05/25/21   Hypercholesterolemia    Orthostatic hypotension    Pneumonia as child   Pneumothorax on left 05/20/2009   Prediabetes    none now   Pulmonary nodules    Sleep apnea    cpap set on 13   Thrombocytopenia (HCC)    pt denies   Thyromegaly    Varicose veins     Past Surgical History:  Procedure Laterality Date   BIOPSY  04/18/2018   Procedure: BIOPSY;  Surgeon: Kerin Salen, MD;  Location: [facility];  Service: Gastroenterology;;   CHOLECYSTECTOMY N/A 08/01/2023   Procedure: LAPAROSCOPIC CHOLECYSTECTOMY WITH ICG DYE;  Surgeon: Moise Boring, MD;  Location: [facility] OR;  Service: General;  Laterality: N/A;   ESOPHAGOGASTRODUODENOSCOPY N/A 04/18/2018   Procedure: ESOPHAGOGASTRODUODENOSCOPY (EGD);  Surgeon: Kerin Salen, MD;  Location: [facility];  Service: Gastroenterology;  Laterality: N/A;   ESOPHAGOGASTRODUODENOSCOPY (EGD) WITH PROPOFOL N/A 01/05/2015   Procedure: ESOPHAGOGASTRODUODENOSCOPY (EGD) WITH PROPOFOL;  Surgeon: Charolett Bumpers, MD;  Location: [facility];  Service: Endoscopy;  Laterality: N/A;   EXTRACORPOREAL  SHOCK WAVE LITHOTRIPSY Left 08/11/2021   Procedure: LEFT EXTRACORPOREAL SHOCK WAVE LITHOTRIPSY (ESWL);  Surgeon: Jannifer Hick, MD;  Location: [facility];  Service: Urology;  Laterality: Left;   GASTRIC ROUX-EN-Y     '08-[name] (weight stable around 302 #   KNEE ARTHROSCOPY     NASAL SEPTOPLASTY W/ TURBINOPLASTY Bilateral 11/13/2022   Procedure: NASAL SEPTOPLASTY WITH BILATERAL TURBINATE REDUCTION;  Surgeon: Newman Pies, MD;  Location: [facility];  Service: ENT;  Laterality: Bilateral;   TONSILLECTOMY     TOTAL KNEE ARTHROPLASTY Left 06/03/2021   Procedure: LEFT TOTAL KNEE ARTHROPLASTY;  Surgeon: Nadara Mustard, MD;  Location: [facility] OR;  Service: Orthopedics;  Laterality: Left;   ULNAR NERVE TRANSPOSITION Right 15 yrs ago    Allergies: Aspirin, Bupropion, Nsaids, and Oxycodone hcl  Medications: Prior to Admission medications   Medication Sig Start Date End Date Taking? Authorizing Provider  acetaminophen (TYLENOL) 650 MG CR tablet Take 1,300 mg by mouth every 8 (eight) hours.   Yes [provider]  alfuzosin (UROXATRAL) 10 MG 24 hr tablet Take 10 mg by mouth daily with breakfast.   Yes [provider]  amphetamine-dextroamphetamine (ADDERALL) 5 MG tablet Take 5 mg by mouth daily as needed (for ADD).   Yes [provider]  apixaban (ELIQUIS) 5 MG TABS tablet Take 5 mg by mouth 2 (two) times daily.   Yes [provider]  atorvastatin (LIPITOR) 20 MG tablet Take 20 mg by mouth at bedtime. 04/03/21  Yes [provider]  cetirizine (ZYRTEC) 10 MG tablet Take 1 tablet (10 mg total) by mouth daily. 06/14/23  Yes Ashok Croon, MD  Coenzyme Q10 (CO Q 10 PO) Take 1 capsule by mouth daily.   Yes [provider]  desvenlafaxine (PRISTIQ) 50 MG 24 hr tablet Take 50 mg by mouth daily. 02/05/23  Yes [provider]  fludrocortisone (FLORINEF) 0.1 MG tablet Take 0.1 mg by mouth daily. 03/07/19  Yes [provider]  fluticasone (FLONASE) 50 MCG/ACT nasal spray Place 2 sprays into both nostrils daily. 06/14/23  Yes Ashok Croon, MD  gabapentin (NEURONTIN) 300 MG capsule Take 2 capsules (600 mg total) by mouth at bedtime. 07/10/23 10/08/23 Yes Vivi Barrack, DPM  Glucosamine-Chondroitin (COSAMIN DS PO) Take 1,500 mg by mouth daily. Rotate with other Joint Health   Yes [provider]  latanoprost (XALATAN) 0.005 % ophthalmic solution Place 1 drop into both eyes at bedtime. 04/23/21  Yes [provider]  LORazepam (ATIVAN) 1 MG tablet Take 1 mg by mouth every 4 (four) hours as needed.   Yes [provider]  Melatonin 10 MG TABS Take 1 tablet by mouth at bedtime.   Yes [provider]  Misc Natural Products (OSTEO BI-FLEX ADV TRIPLE ST PO) Take 1 tablet by mouth daily.   Yes [provider]  Multiple Vitamins-Minerals (ONE DAILY MULTIVITAMIN MEN) TABS Take 1 tablet by mouth 2 (two) times daily. CENTRUM SILVER 04/13/06  Yes [provider]  pantoprazole (PROTONIX) 40 MG tablet Take 40 mg by mouth in the morning and at bedtime. 10/24/09  Yes [provider]  Probiotic Product (PROBIOTIC PO) Take 1-2 capsules by mouth daily.  Yes [provider]  sildenafil (REVATIO) 20 MG tablet Take 20 mg by mouth daily as needed.   Yes [provider]  sodium chloride (OCEAN) 0.65 % SOLN nasal spray Place 1 spray into both nostrils as needed. 06/14/23  Yes Ashok Croon, MD  SUPER B COMPLEX/C PO Take 1 tablet by mouth daily.   Yes [provider]  zolpidem (AMBIEN) 10 MG tablet Take 5-10 mg by mouth at bedtime. 08/12/14  Yes [provider]  acetaminophen (TYLENOL) 325 MG tablet Take 2 tablets (650 mg total) by mouth every 4 (four) hours. 08/07/23   Arrien, York Ram, MD  acidophilus (RISAQUAD) CAPS capsule Take 1 capsule by mouth daily. 08/08/23   Arrien, York Ram, MD  calcium carbonate (TUMS - DOSED  IN MG ELEMENTAL CALCIUM) 500 MG chewable tablet Chew 1 tablet (200 mg of elemental calcium total) by mouth 3 (three) times daily. 08/07/23   Arrien, York Ram, MD  docusate sodium (COLACE) 100 MG capsule Take 1 capsule (100 mg total) by mouth 2 (two) times daily. 08/07/23   Arrien, York Ram, MD  feeding supplement (ENSURE ENLIVE / ENSURE PLUS) LIQD Take 237 mLs by mouth 2 (two) times daily between meals. 08/07/23   Arrien, York Ram, MD  latanoprost (XALATAN) 0.005 % ophthalmic solution Place 1 drop into both eyes at bedtime. 08/07/23   Arrien, York Ram, MD  midodrine (PROAMATINE) 5 MG tablet Take 1 tablet (5 mg total) by mouth 3 (three) times daily with meals. 08/07/23   Arrien, York Ram, MD  piperacillin-tazobactam (ZOSYN) 3.375 GM/50ML IVPB Inject 50 mLs (3.375 g total) into the vein every 8 (eight) hours. 08/07/23   Arrien, York Ram, MD  polyethylene glycol (MIRALAX / GLYCOLAX) 17 g packet Take 17 g by mouth daily. 08/08/23   Arrien, York Ram, MD  senna-docusate (SENOKOT-S) 8.6-50 MG tablet Take 1 tablet by mouth at bedtime as needed for mild constipation. 08/07/23   Arrien, York Ram, MD  simethicone (MYLICON) 80 MG chewable tablet Chew 1 tablet (80 mg total) by mouth 4 (four) times daily as needed for flatulence. 08/07/23   Arrien, York Ram, MD  venlafaxine XR (EFFEXOR-XR) 37.5 MG 24 hr capsule Take 1 capsule (37.5 mg total) by mouth daily with breakfast. 08/08/23   Arrien, York Ram, MD     Family History  Problem Relation Age of Onset   Heart disease Mother    Heart failure Mother    Heart disease Father    Heart attack Father    Stroke Neg Hx     Social History   Socioeconomic History   Marital status: Single    Spouse name: Not on file   Number of children: Not on file   Years of education: Not on file   Highest education level: Not on file  Occupational History   Not on file  Tobacco Use   Smoking status: Never    Smokeless tobacco: Never  Vaping Use   Vaping status: Never Used  Substance and Sexual Activity   Alcohol use: No    Alcohol/week: 0.0 standard drinks of alcohol   Drug use: Never   Sexual activity: Yes  Other Topics Concern   Not on file  Social History Narrative   Not on file   Social Drivers of Health   Financial Resource Strain: Not on file  Food Insecurity: No Food Insecurity (07/29/2023)   Hunger Vital Sign    Worried About Running Out of Food in the Last Year:  Never true    Ran Out of Food in the Last Year: Never true  Transportation Needs: No Transportation Needs (07/29/2023)   PRAPARE - Administrator, Civil Service (Medical): No    Lack of Transportation (Non-Medical): No  Physical Activity: Not on file  Stress: Not on file  Social Connections: Patient Declined (07/31/2023)   Social Connection and Isolation Panel [NHANES]    Frequency of Communication with Friends and Family: Patient declined    Frequency of Social Gatherings with Friends and Family: Patient declined    Attends Religious Services: Patient declined    Database administrator or Organizations: Patient declined    Attends Banker Meetings: Patient declined    Marital Status: Patient declined    Review of Systems  Gastrointestinal:  Positive for abdominal pain (moderate pain in RUQ, particularly when pain meds wear off).  Denies any N/V, chest pain, shortness of breath, fevers/chills. All other ROS negative.  Vital Signs: BP 95/65 (BP Location: Left Arm)   Pulse 81   Temp 97.7 F (36.5 C) (Oral)   Resp 18   Ht 6\' 4"  (1.93 m)   Wt 205 lb 0.4 oz (93 kg)   SpO2 100%   BMI 24.96 kg/m    Physical Exam Vitals reviewed.  Constitutional:      Appearance: Normal appearance.  HENT:     Head: Normocephalic and atraumatic.     Mouth/Throat:     Mouth: Mucous membranes are moist.     Pharynx: Oropharynx is clear.  Cardiovascular:     Rate and Rhythm: Normal rate. Rhythm  irregular.     Heart sounds: Normal heart sounds.  Pulmonary:     Effort: Pulmonary effort is normal.     Breath sounds: Normal breath sounds.  Abdominal:     General: Abdomen is flat.     Palpations: Abdomen is soft.     Tenderness: There is abdominal tenderness (periumbilical and RUQ).     Comments: JP drain in place with murky, blood tinged fluid in bulb  Musculoskeletal:        General: Normal range of motion.     Cervical back: Normal range of motion.  Skin:    General: Skin is warm and dry.  Neurological:     General: No focal deficit present.     Mental Status: He is alert and oriented to person, place, and time. Mental status is at baseline.  Psychiatric:        Mood and Affect: Mood normal.        Behavior: Behavior normal.        Judgment: Judgment normal.    Imaging: MR ABDOMEN MRCP W WO CONTAST Result Date: 08/08/2023 CLINICAL DATA:  Bile leak EXAM: MRI ABDOMEN WITHOUT AND WITH CONTRAST (INCLUDING MRCP) TECHNIQUE: Multiplanar multisequence MR imaging of the abdomen was performed both before and after the administration of intravenous contrast. Heavily T2-weighted images of the biliary and pancreatic ducts were obtained, and three-dimensional MRCP images were rendered by post processing. CONTRAST:  10mL GADAVIST GADOBUTROL 1 MMOL/ML IV SOLN COMPARISON:  CT abdomen/pelvis dated 07/29/2023. FINDINGS: Motion degraded images. Lower chest: Small bilateral pleural effusions. Hepatobiliary: Liver is within normal limits. Status post subtotal cholecystectomy with residual gallbladder lumen and soft tissue debris in the surgical bed. Associated 9.2 x 6.1 x 10.8 cm fluid collection extending from the gallbladder fossa (series 6/image 31) to the inferior right perihepatic space (series 2/image 29). No intrahepatic or extrahepatic ductal dilatation.  Common duct measures 5 mm, without discontinuity on MR. No choledocholithiasis is seen. Pancreas:  Within normal limits. Spleen:  Within normal  limits. Adrenals/Urinary Tract:  Adrenal glands are within normal limits. Bilateral simple renal cysts, measuring up to 4.7 cm in the anterior left lower kidney (series 3/image 32), benign (Bosniak I). No follow-up is recommended. No hydronephrosis. Stomach/Bowel: Stomach is within normal limits. Visualized bowel is grossly unremarkable. Vascular/Lymphatic:  No evidence of aortic aneurysm. No suspicious abdominal lymphadenopathy. Other:  No abdominal ascites. Musculoskeletal: No focal osseous lesions. IMPRESSION: Status post subtotal cholecystectomy with residual gallbladder lumen and soft tissue debris in the surgical bed. Associated 10.8 cm fluid collection extending from the gallbladder fossa to the inferior right perihepatic space. No intrahepatic or extrahepatic ductal dilatation. Common duct measures 5 mm, without discontinuity on MR. No choledocholithiasis is seen. Small bilateral pleural effusions. Electronically Signed   By: Charline Bills M.D.   On: 08/08/2023 03:10   MR 3D Recon At Scanner Result Date: 08/08/2023 CLINICAL DATA:  Bile leak EXAM: MRI ABDOMEN WITHOUT AND WITH CONTRAST (INCLUDING MRCP) TECHNIQUE: Multiplanar multisequence MR imaging of the abdomen was performed both before and after the administration of intravenous contrast. Heavily T2-weighted images of the biliary and pancreatic ducts were obtained, and three-dimensional MRCP images were rendered by post processing. CONTRAST:  10mL GADAVIST GADOBUTROL 1 MMOL/ML IV SOLN COMPARISON:  CT abdomen/pelvis dated 07/29/2023. FINDINGS: Motion degraded images. Lower chest: Small bilateral pleural effusions. Hepatobiliary: Liver is within normal limits. Status post subtotal cholecystectomy with residual gallbladder lumen and soft tissue debris in the surgical bed. Associated 9.2 x 6.1 x 10.8 cm fluid collection extending from the gallbladder fossa (series 6/image 31) to the inferior right perihepatic space (series 2/image 29). No intrahepatic  or extrahepatic ductal dilatation. Common duct measures 5 mm, without discontinuity on MR. No choledocholithiasis is seen. Pancreas:  Within normal limits. Spleen:  Within normal limits. Adrenals/Urinary Tract:  Adrenal glands are within normal limits. Bilateral simple renal cysts, measuring up to 4.7 cm in the anterior left lower kidney (series 3/image 32), benign (Bosniak I). No follow-up is recommended. No hydronephrosis. Stomach/Bowel: Stomach is within normal limits. Visualized bowel is grossly unremarkable. Vascular/Lymphatic:  No evidence of aortic aneurysm. No suspicious abdominal lymphadenopathy. Other:  No abdominal ascites. Musculoskeletal: No focal osseous lesions. IMPRESSION: Status post subtotal cholecystectomy with residual gallbladder lumen and soft tissue debris in the surgical bed. Associated 10.8 cm fluid collection extending from the gallbladder fossa to the inferior right perihepatic space. No intrahepatic or extrahepatic ductal dilatation. Common duct measures 5 mm, without discontinuity on MR. No choledocholithiasis is seen. Small bilateral pleural effusions. Electronically Signed   By: Charline Bills M.D.   On: 08/08/2023 03:10   DG CHEST PORT 1 VIEW Result Date: 08/03/2023 CLINICAL DATA:  PICC in place. EXAM: PORTABLE CHEST 1 VIEW COMPARISON:  3 hours prior FINDINGS: Tip of the right upper extremity PICC in the region of the atrial caval junction. Lower lung volumes from earlier today. Stable mediastinal contours, aortic atherosclerosis. Increasing atelectasis in the left lung base and right infrahilar region. No pneumothorax or large pleural effusion. IMPRESSION: 1. Tip of the right upper extremity PICC in the region of the atrial caval junction. 2. Lower lung volumes with increasing atelectasis in the left lung base and right infrahilar region. Electronically Signed   By: Narda Rutherford M.D.   On: 08/03/2023 20:46   DG Chest Port 1 View Result Date: 08/03/2023 CLINICAL DATA:   Line placement EXAM:  PORTABLE CHEST 1 VIEW COMPARISON:  X-ray 07/28/2023.  CT 07/28/2023. FINDINGS: Placement of a right-sided PICC with the tip along the upper right atrium. Stable cardiopericardial silhouette when adjusting for level of underinflation. Calcified aorta. Increasing bandlike changes right perihilar in the left lung base, favoring atelectasis. No pneumothorax, effusion or edema. Overlapping cardiac leads. The left inferior costophrenic angle is clipped off the edge of the film. Degenerative changes along the spine. IMPRESSION: Right-sided PICC in place with tip along the upper right atrium. Decreased inflation with some increasing atelectasis. Electronically Signed   By: Karen Kays M.D.   On: 08/03/2023 18:04   [place] EKG SITE RITE Result Date: 08/03/2023 If Site Rite image not attached, placement could not be confirmed due to current cardiac rhythm.  LONG TERM MONITOR (3-14 DAYS) Result Date: 08/01/2023   Atrial Fibrillation occurred continuously (100% burden), ranging from 53-193 bpm (avg of 94 bpm). Patch Wear Time:  13 days and 13 hours (2024-12-17T13:14:02-499 to 2024-12-31T02:25:18-0500) 2 Ventricular Tachycardia runs occurred, the run with the fastest interval lasting 4 beats with a max rate of 164 bpm, the longest lasting 11.4 secs with an avg rate of 132 bpm. Atrial Fibrillation occurred continuously (100% burden), ranging from 53-193  bpm (avg of 94 bpm). Isolated VEs were rare (<1.0%, 2515), VE Couplets were rare (<1.0%, 67), and VE Triplets were rare (<1.0%, 5). Ventricular Bigeminy was present.   ECHOCARDIOGRAM COMPLETE Result Date: 07/31/2023    ECHOCARDIOGRAM REPORT   Patient Name:   Francisco Padilla Date of Exam: 07/31/2023 Medical Rec #:  [id number]     Height:       76.0 in Accession #:    [id number]    Weight:       205.0 lb Date of Birth:  [birth year]     BSA:          2.239 m Patient Age:    72 years      BP:           96/74 mmHg Patient Gender: M             HR:           100 bpm.  Exam Location:  Inpatient Procedure: 2D Echo, Cardiac Doppler, Color Doppler and Intracardiac            Opacification Agent Indications:    Preoperative Evaluation  History:        Patient has prior history of Echocardiogram examinations, most                 recent 09/11/2014. CAD, Aortic Valve Disease, Arrythmias:Atrial                 Fibrillation; Signs/Symptoms:Hypotension.  Sonographer:    Webb Laws Referring Phys: [id number] SHENG L HALEY  Sonographer Comments: Suboptimal parasternal window and suboptimal apical window. IMPRESSIONS  1. The aortic valve is severely calcified in views obtained. Vmax 2.9 m/s, MG 20 mmHG, AVA 0.96 cm2, DI 0.21. LV SVI low 22 cc/m2. 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 If there are clinical concerns for severe aortic stenosis, would recommend an aortic valve calcium score for clarification. The aortic valve is calcified. Aortic valve regurgitation is not visualized. Moderate aortic valve stenosis. Aortic valve area, by VTI measures 0.96 cm. Aortic valve mean gradient measures 20.3 mmHg. Aortic valve Vmax measures 2.93 m/s.  2. Left ventricular ejection fraction, by estimation, is 60 to 65%. The left ventricle has normal function.  The left ventricle has no regional wall motion abnormalities. Indeterminate diastolic filling due to E-A fusion.  3. Right ventricular systolic function is mildly reduced. The right ventricular size is mildly enlarged. There is normal pulmonary artery systolic pressure. The estimated right ventricular systolic pressure is 30.2 mmHg.  4. Left atrial size was moderately dilated.  5. Right atrial size was mildly dilated.  6. The mitral valve is grossly normal. Trivial mitral valve regurgitation. No evidence of mitral stenosis.  7. The inferior vena cava is normal in size with greater than 50% respiratory variability, suggesting right atrial pressure of 3 mmHg. FINDINGS  Left Ventricle: Left ventricular  ejection fraction, by estimation, is 60 to 65%. The left ventricle has normal function. The left ventricle has no regional wall motion abnormalities. Definity contrast agent was given IV to delineate the left ventricular  endocardial borders. The left ventricular internal cavity size was normal in size. There is no left ventricular hypertrophy. Indeterminate diastolic filling due to E-A fusion. Right Ventricle: The right ventricular size is mildly enlarged. No increase in right ventricular wall thickness. Right ventricular systolic function is mildly reduced. There is normal pulmonary artery systolic pressure. The tricuspid regurgitant velocity  is 1.95 m/s, and with an assumed right atrial pressure of 15 mmHg, the estimated right ventricular systolic pressure is 30.2 mmHg. Left Atrium: Left atrial size was moderately dilated. Right Atrium: Right atrial size was mildly dilated. Pericardium: There is no evidence of pericardial effusion. Mitral Valve: The mitral valve is grossly normal. Trivial mitral valve regurgitation. No evidence of mitral valve stenosis. Tricuspid Valve: The tricuspid valve is grossly normal. Tricuspid valve regurgitation is trivial. No evidence of tricuspid stenosis. Aortic Valve: The aortic valve is severely calcified in views obtained. Vmax 2.9 m/s, MG 20 mmHG, AVA 0.96 cm2, DI 0.21. LV SVI low 22 cc/m2. 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 If there are clinical concerns for severe aortic stenosis, would recommend an aortic valve calcium score for clarification. The aortic valve is calcified. Aortic valve regurgitation is not visualized. Moderate aortic stenosis is present. Aortic valve mean gradient measures 20.3 mmHg. Aortic valve peak gradient measures 34.3 mmHg. Aortic valve area, by VTI measures 0.96 cm. Pulmonic Valve: The pulmonic valve was grossly normal. Pulmonic valve regurgitation is not visualized. No evidence of pulmonic stenosis.  Aorta: The aortic root and ascending aorta are structurally normal, with no evidence of dilitation. Venous: The inferior vena cava is normal in size with greater than 50% respiratory variability, suggesting right atrial pressure of 3 mmHg. IAS/Shunts: The atrial septum is grossly normal.  LEFT VENTRICLE PLAX 2D LVIDd:         4.70 cm      Diastology LVIDs:         3.30 cm      LV e' medial:    10.40 cm/s LV PW:         1.00 cm      LV E/e' medial:  5.8 LV IVS:        0.90 cm      LV e' lateral:   14.60 cm/s LVOT diam:     2.40 cm      LV E/e' lateral: 4.1 LV SV:         50 LV SV Index:   22 LVOT Area:     4.52 cm  LV Volumes (MOD) LV vol d, MOD A2C: 61.7 ml LV vol d, MOD A4C: 113.0 ml LV vol  s, MOD A2C: 29.6 ml LV vol s, MOD A4C: 45.4 ml LV SV MOD A2C:     32.1 ml LV SV MOD A4C:     113.0 ml LV SV MOD BP:      51.5 ml RIGHT VENTRICLE            IVC RV Basal diam:  4.20 cm    IVC diam: 3.20 cm RV Mid diam:    3.40 cm RV S prime:     9.48 cm/s TAPSE (M-mode): 1.7 cm LEFT ATRIUM            Index        RIGHT ATRIUM           Index LA diam:      5.60 cm  2.50 cm/m   RA Area:     17.50 cm LA Vol (A4C): 115.0 ml 51.37 ml/m  RA Volume:   41.50 ml  18.54 ml/m  AORTIC VALVE AV Area (Vmax):    0.99 cm AV Area (Vmean):   0.98 cm AV Area (VTI):     0.96 cm AV Vmax:           292.98 cm/s AV Vmean:          200.557 cm/s AV VTI:            0.526 m AV Peak Grad:      34.3 mmHg AV Mean Grad:      20.3 mmHg LVOT Vmax:         64.30 cm/s LVOT Vmean:        43.400 cm/s LVOT VTI:          0.111 m LVOT/AV VTI ratio: 0.21  AORTA Ao Asc diam: 3.50 cm MITRAL VALVE               TRICUSPID VALVE MV Area (PHT): 6.32 cm    TR Peak grad:   15.2 mmHg MV Decel Time: 120 msec    TR Vmax:        195.00 cm/s MV E velocity: 60.20 cm/s MV A velocity: 40.30 cm/s  SHUNTS MV E/A ratio:  1.49        Systemic VTI:  0.11 m                            Systemic Diam: 2.40 cm Lennie Odor MD Electronically signed by Lennie Odor MD Signature  Date/Time: 07/31/2023/10:21:24 AM    Final    CT Chest W Contrast Result Date: 07/29/2023 CLINICAL DATA:  Sepsis, weakness, fatigue. EXAM: CT CHEST WITH CONTRAST TECHNIQUE: Multidetector CT imaging of the chest was performed during intravenous contrast administration. RADIATION DOSE REDUCTION: This exam was performed according to the departmental dose-optimization program which includes automated exposure control, adjustment of the mA and/or kV according to patient size and/or use of iterative reconstruction technique. CONTRAST:  75mL OMNIPAQUE IOHEXOL 350 MG/ML SOLN COMPARISON:  01/20/2011 FINDINGS: Cardiovascular: Heart is normal size. Aorta is normal caliber. Diffuse 3 vessel coronary artery disease. Aortic atherosclerosis. Mediastinum/Nodes: No mediastinal, hilar, or axillary adenopathy. Trachea and esophagus are unremarkable. Thyroid unremarkable. Lungs/Pleura: Lungs are clear. No focal airspace opacities or suspicious nodules. No effusions. Upper Abdomen: See abdominal CT report Musculoskeletal: Chest wall soft tissues are unremarkable. No acute bony abnormality. IMPRESSION: No acute cardiopulmonary disease. Three vessel coronary artery disease. Aortic Atherosclerosis (ICD10-I70.0). Electronically Signed   By: Charlett Nose M.D.   On: 07/29/2023 00:15  CT Head Wo Contrast Result Date: 07/29/2023 CLINICAL DATA:  Memory loss EXAM: CT HEAD WITHOUT CONTRAST TECHNIQUE: Contiguous axial images were obtained from the base of the skull through the vertex without intravenous contrast. RADIATION DOSE REDUCTION: This exam was performed according to the departmental dose-optimization program which includes automated exposure control, adjustment of the mA and/or kV according to patient size and/or use of iterative reconstruction technique. COMPARISON:  12/07/2022 FINDINGS: Brain: There is atrophy and chronic small vessel disease changes. No acute intracranial abnormality. Specifically, no hemorrhage, hydrocephalus, mass  lesion, acute infarction, or significant intracranial injury. Vascular: No hyperdense vessel or unexpected calcification. Skull: No acute calvarial abnormality. Sinuses/Orbits: No acute findings Other: None IMPRESSION: Atrophy, chronic microvascular disease. No acute intracranial abnormality. Electronically Signed   By: Charlett Nose M.D.   On: 07/29/2023 00:14   CT ABDOMEN PELVIS W CONTRAST Result Date: 07/29/2023 CLINICAL DATA:  Weakness, fatigue.  Abdominal pain. EXAM: CT ABDOMEN AND PELVIS WITH CONTRAST TECHNIQUE: Multidetector CT imaging of the abdomen and pelvis was performed using the standard protocol following bolus administration of intravenous contrast. RADIATION DOSE REDUCTION: This exam was performed according to the departmental dose-optimization program which includes automated exposure control, adjustment of the mA and/or kV according to patient size and/or use of iterative reconstruction technique. CONTRAST:  75mL OMNIPAQUE IOHEXOL 350 MG/ML SOLN COMPARISON:  06/06/2021 FINDINGS: Lower chest: No acute abnormality. Hepatobiliary: Multiple layering gallstones within the gallbladder. Gallbladder is distended with gallbladder wall thickening. Findings concerning for acute cholecystitis. No focal hepatic abnormality. Mild periportal edema. No biliary ductal dilatation. Pancreas: Atrophy of the pancreas. No focal abnormality or ductal dilatation. Spleen: No focal abnormality.  Normal size. Adrenals/Urinary Tract: Adrenal glands normal. Numerous bilateral nonobstructing renal stones. Bilateral renal cysts are similar prior study. No follow-up imaging recommended. No definite ureteral stones. In the left pelvis posterior to the bladder in the region of the distal left ureter. The ureter is decompressed and difficult to follows/visualized. Given the decompressed state, I favor this is likely an adjacent phlebolith. Stomach/Bowel: Prior Roux-en-Y gastric bypass. Stomach, large and small bowel grossly  unremarkable. Vascular/Lymphatic: Aortoiliac atherosclerosis. No evidence of aneurysm or adenopathy. Reproductive: No visible focal abnormality. Other: No free fluid or free air. Musculoskeletal: No acute bony abnormality. IMPRESSION: Cholelithiasis. Gallbladder distension with gallbladder wall thickening and surrounding inflammation concerning for acute cholecystitis. Periportal edema can be seen with volume overload/resuscitation or inflammatory processes such as hepatitis. Bilateral nephrolithiasis.  No hydronephrosis. Prior gastric bypass.  No visible complicating feature. Aortoiliac atherosclerosis. Electronically Signed   By: Charlett Nose M.D.   On: 07/29/2023 00:13   DG Ribs Unilateral W/Chest Right Result Date: 07/28/2023 CLINICAL DATA:  Recent fall with right rib pain, initial encounter EXAM: RIGHT RIBS AND CHEST - 3+ VIEW COMPARISON:  07/31/2022 FINDINGS: Cardiac shadow is within normal limits. Aortic calcifications are noted. The lungs are clear bilaterally. No infiltrate or pneumothorax is seen. No acute rib abnormality noted. IMPRESSION: No acute rib fracture seen. Electronically Signed   By: Alcide Clever M.D.   On: 07/28/2023 23:39    Labs:  CBC: Recent Labs    08/07/23 0840 08/07/23 0930 08/08/23 0629 08/09/23 0508  WBC 17.0* 17.3* 15.5* 13.5*  HGB 11.6* 11.0* 10.5* 10.0*  HCT 35.0* 33.0* 31.8* 29.7*  PLT 311 309 295 295    COAGS: Recent Labs    11/22/22 0955 12/07/22 1548 08/01/23 0303 08/01/23 2009 08/02/23 1955 08/03/23 0845 08/07/23 0930 08/08/23 0944 08/08/23 1622  INR 1.2 1.1  --  1.3*  --   --   --   --   --   APTT  --   --    < > 40*   < > 39* 31 70* 96*   < > = values in this interval not displayed.    BMP: Recent Labs    08/05/23 0513 08/06/23 0845 08/08/23 0630 08/09/23 0630  NA 132* 133* 137 137  K 4.1 3.5 3.6 3.4*  CL 99 97* 100 100  CO2 25 28 30 29   GLUCOSE 141* 127* 93 97  BUN 20 18 17 14   CALCIUM 8.7* 8.6* 8.2* 8.1*  CREATININE 0.57*  0.69 0.59* 0.61  GFRNONAA >60 >60 >60 >60    LIVER FUNCTION TESTS: Recent Labs    08/02/23 0221 08/03/23 0844 08/08/23 0630 08/09/23 0630  BILITOT 1.5* 1.1 0.7 0.7  AST 32 29 15 16   ALT 43 35 19 19  ALKPHOS 69 79 51 55  PROT 5.8* 6.0* 4.5* 4.5*  ALBUMIN 2.5* 2.4* 1.8* 1.7*    TUMOR MARKERS: No results for input(s): "AFPTM", "CEA", "CA199", "CHROMGRNA" in the last 8760 hours.  Assessment and Plan:  73 y.o. male with a history of OSA on CPAP, PVD, peripheral neuropathy, HLD, A fib on Eliquis, GERD, and history of bariatric surgery who initially presented to the [facility] ED via EMS for generalized weakness/fatigue for 1 month. Found to have cholecystitis. Lap chole performed on 08/01/23 following 48hr Eliquis hold. MR Abdomen on 08/08/23 significant for 'subtotal cholecystectomy w/ residual gallbladder lumen and soft tissue debris in the surgical bed. Associated 10.8cm fluid collection extending from the gallbladder fossa to inferior right perihepatic space. CBD measuring 5mm.' IR consulted for percutaneous cholangiogram with possible biliary drain and biloma drain placement.   -NPO since midnight -Heparin infusion d/c at 10:35am -INR pending   Plan for percutaneous transhepatic cholangiogram with possible biliary and biloma drain placement with Dr. Andrey Campanile on 08/09/23  Risks and benefits discussed with the patient including bleeding, infection, damage to adjacent structures, bowel perforation/fistula connection, and sepsis.  All of the patient's questions were answered, patient is agreeable to proceed. Consent signed and in chart.   Thank you for this interesting consult. I greatly enjoyed meeting Francisco Padilla and look forward to participating in their care. A copy of this report was sent to the requesting provider on this date.  Electronically Signed: Jama Flavors, PA-C 08/09/2023, 11:06 AM   I spent a total of 40 Minutes  in face to face clinical consultation, greater than 50%  of which was counseling/coordinating care for percutaneous transhepatic cholangiogram with possible biliary drain and biloma drain placement.

## 2023-08-09 NOTE — Plan of Care (Signed)

## 2023-08-09 NOTE — Progress Notes (Addendum)
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   Oxycodone Hcl     Hallucinations, agitation     Patient Measurements: Height: 6\' 4"  (193 cm) Weight: 93 kg (205 lb 0.4 oz) IBW/kg (Calculated) : 86.8 Heparin Dosing Weight: 93 kg  Vital Signs: Temp: 97.8 F (36.6 C) (01/16 0500) Temp Source: Oral (01/16 0500) BP: 97/76 (01/16 0500) Pulse Rate: 79 (01/16 0500)  Labs: Recent Labs    08/06/23 0845 08/06/23 1506 08/07/23 0930 08/07/23 1530 08/08/23 0629 08/08/23 0630 08/08/23 0944 08/08/23 1622 08/09/23 0508 08/09/23 0630  HGB 11.0*   < > 11.0*  --  10.5*  --   --   --  10.0*  --   HCT 32.6*   < > 33.0*  --  31.8*  --   --   --  29.7*  --   PLT 288   < > 309  --  295  --   --   --  295  --   APTT  --   --  31  --   --   --  70* 96*  --   --   HEPARINUNFRC  --    < >  --    < >  --   --  0.36 0.40 0.43  --   CREATININE 0.69  --   --   --   --  0.59*  --   --   --  0.61   < > = values in this interval not displayed.    Estimated Creatinine Clearance: 104 mL/min (by C-G formula based on SCr of 0.61 mg/dL).  Assessment: 73 y.o. male . Patine wason Eliquis pta for PAF. Last dose given 1/3. Transitioned to IV Heparin which was stopped 1/8 AM for cholecystectomy. Held throughout post-operative day 0 (1/9) due to bleeding loss during surgery. Pharmacy consulted to resume IV Heparin  on 08/03/23 AM - no bolus. Pt continues on heparin s/p cholecystectomy 08/01/23. Patient transitioned back to apixaban 1/14 but patient with increased abdominal pain. Switched back to heparin for possible procedure. Apixaban last given 1/14 0818.   Heparin level 0.43 is therapeutic on 2700 units/hr.  Heparin held at 9:30 for procedure   Goal of  Therapy:  Heparin level 0.3-0.7 units/mL aPTT 66-102 seconds Monitor platelets by anticoagulation protocol: Yes   Plan:  F/u restart Heparin at 2700 units/hr after IR procedures Monitor daily heparin level, CBC, signs/symptoms of bleeding  F/u restart apix when no procedures  Thank you for allowing pharmacy to be part of this patients care team.  Alphia Moh, PharmD, BCPS, BCCP Clinical Pharmacist  Please check AMION for all [facility] Pharm[facility] phone numbers After 10:00 PM, call Main Pharmacy [phone]

## 2023-08-09 NOTE — Plan of Care (Signed)
  Problem: Education: Goal: Knowledge of General Education information will improve Description Including pain rating scale, medication(s)/side effects and non-pharmacologic comfort measures Outcome: Progressing   Problem: Health Behavior/Discharge Planning: Goal: Ability to manage health-related needs will improve Outcome: Progressing   

## 2023-08-09 NOTE — Progress Notes (Signed)
8 Days Post-Op  Subjective: Patient still with some RUQ abdominal pain, but managing with dilaudid.  Is agreeable to proceed with IR procedures with drain placement and PTC drain placement.  ROS: See above, otherwise other systems negative  Objective: Vital signs in last 24 hours: Temp:  [97.7 F (36.5 C)-98.6 F (37 C)] 97.7 F (36.5 C) (01/16 0740) Pulse Rate:  [78-96] 81 (01/16 0740) Resp:  [13-21] 18 (01/16 0740) BP: (83-97)/(61-76) 95/65 (01/16 0740) SpO2:  [98 %-100 %] 100 % (01/16 0740) Weight:  [93 kg] 93 kg (01/16 0500) Last BM Date : 08/08/23  Intake/Output from previous day: 01/15 0701 - 01/16 0700 In: 457.7 [P.O.:180; I.V.:277.7] Out: 1672 [Urine:1300; Drains:372] Intake/Output this shift: No intake/output data recorded.  PE: Gen: NAD Abd: soft, tender in in RUQ today, JP with 372cc yesterday and purulent bilious fluid in JP drain today, incisions are c/d/i  Lab Results:  Recent Labs    08/08/23 0629 08/09/23 0508  WBC 15.5* 13.5*  HGB 10.5* 10.0*  HCT 31.8* 29.7*  PLT 295 295   BMET Recent Labs    08/08/23 0630 08/09/23 0630  NA 137 137  K 3.6 3.4*  CL 100 100  CO2 30 29  GLUCOSE 93 97  BUN 17 14  CREATININE 0.59* 0.61  CALCIUM 8.2* 8.1*   PT/INR No results for input(s): "LABPROT", "INR" in the last 72 hours. CMP     Component Value Date/Time   NA 137 08/09/2023 0630   K 3.4 (L) 08/09/2023 0630   CL 100 08/09/2023 0630   CO2 29 08/09/2023 0630   GLUCOSE 97 08/09/2023 0630   BUN 14 08/09/2023 0630   CREATININE 0.61 08/09/2023 0630   CREATININE 0.77 07/19/2023 1128   CALCIUM 8.1 (L) 08/09/2023 0630   PROT 4.5 (L) 08/09/2023 0630   ALBUMIN 1.7 (L) 08/09/2023 0630   AST 16 08/09/2023 0630   ALT 19 08/09/2023 0630   ALKPHOS 55 08/09/2023 0630   BILITOT 0.7 08/09/2023 0630   GFRNONAA >60 08/09/2023 0630   GFRAA  10/23/2009 0135    >60        The eGFR has been calculated using the MDRD equation. This calculation has not  been validated in all clinical situations. eGFR's persistently <60 mL/min signify possible Chronic Kidney Disease.   Lipase     Component Value Date/Time   LIPASE 29 07/29/2023 0014       Studies/Results: MR ABDOMEN MRCP W WO CONTAST Result Date: 08/08/2023 CLINICAL DATA:  Bile leak EXAM: MRI ABDOMEN WITHOUT AND WITH CONTRAST (INCLUDING MRCP) TECHNIQUE: Multiplanar multisequence MR imaging of the abdomen was performed both before and after the administration of intravenous contrast. Heavily T2-weighted images of the biliary and pancreatic ducts were obtained, and three-dimensional MRCP images were rendered by post processing. CONTRAST:  10mL GADAVIST GADOBUTROL 1 MMOL/ML IV SOLN COMPARISON:  CT abdomen/pelvis dated 07/29/2023. FINDINGS: Motion degraded images. Lower chest: Small bilateral pleural effusions. Hepatobiliary: Liver is within normal limits. Status post subtotal cholecystectomy with residual gallbladder lumen and soft tissue debris in the surgical bed. Associated 9.2 x 6.1 x 10.8 cm fluid collection extending from the gallbladder fossa (series 6/image 31) to the inferior right perihepatic space (series 2/image 29). No intrahepatic or extrahepatic ductal dilatation. Common duct measures 5 mm, without discontinuity on MR. No choledocholithiasis is seen. Pancreas:  Within normal limits. Spleen:  Within normal limits. Adrenals/Urinary Tract:  Adrenal glands are within normal limits. Bilateral simple renal cysts, measuring up to 4.7  cm in the anterior left lower kidney (series 3/image 32), benign (Bosniak I). No follow-up is recommended. No hydronephrosis. Stomach/Bowel: Stomach is within normal limits. Visualized bowel is grossly unremarkable. Vascular/Lymphatic:  No evidence of aortic aneurysm. No suspicious abdominal lymphadenopathy. Other:  No abdominal ascites. Musculoskeletal: No focal osseous lesions. IMPRESSION: Status post subtotal cholecystectomy with residual gallbladder lumen and  soft tissue debris in the surgical bed. Associated 10.8 cm fluid collection extending from the gallbladder fossa to the inferior right perihepatic space. No intrahepatic or extrahepatic ductal dilatation. Common duct measures 5 mm, without discontinuity on MR. No choledocholithiasis is seen. Small bilateral pleural effusions. Electronically Signed   By: Charline Bills M.D.   On: 08/08/2023 03:10   MR 3D Recon At Scanner Result Date: 08/08/2023 CLINICAL DATA:  Bile leak EXAM: MRI ABDOMEN WITHOUT AND WITH CONTRAST (INCLUDING MRCP) TECHNIQUE: Multiplanar multisequence MR imaging of the abdomen was performed both before and after the administration of intravenous contrast. Heavily T2-weighted images of the biliary and pancreatic ducts were obtained, and three-dimensional MRCP images were rendered by post processing. CONTRAST:  10mL GADAVIST GADOBUTROL 1 MMOL/ML IV SOLN COMPARISON:  CT abdomen/pelvis dated 07/29/2023. FINDINGS: Motion degraded images. Lower chest: Small bilateral pleural effusions. Hepatobiliary: Liver is within normal limits. Status post subtotal cholecystectomy with residual gallbladder lumen and soft tissue debris in the surgical bed. Associated 9.2 x 6.1 x 10.8 cm fluid collection extending from the gallbladder fossa (series 6/image 31) to the inferior right perihepatic space (series 2/image 29). No intrahepatic or extrahepatic ductal dilatation. Common duct measures 5 mm, without discontinuity on MR. No choledocholithiasis is seen. Pancreas:  Within normal limits. Spleen:  Within normal limits. Adrenals/Urinary Tract:  Adrenal glands are within normal limits. Bilateral simple renal cysts, measuring up to 4.7 cm in the anterior left lower kidney (series 3/image 32), benign (Bosniak I). No follow-up is recommended. No hydronephrosis. Stomach/Bowel: Stomach is within normal limits. Visualized bowel is grossly unremarkable. Vascular/Lymphatic:  No evidence of aortic aneurysm. No suspicious  abdominal lymphadenopathy. Other:  No abdominal ascites. Musculoskeletal: No focal osseous lesions. IMPRESSION: Status post subtotal cholecystectomy with residual gallbladder lumen and soft tissue debris in the surgical bed. Associated 10.8 cm fluid collection extending from the gallbladder fossa to the inferior right perihepatic space. No intrahepatic or extrahepatic ductal dilatation. Common duct measures 5 mm, without discontinuity on MR. No choledocholithiasis is seen. Small bilateral pleural effusions. Electronically Signed   By: Charline Bills M.D.   On: 08/08/2023 03:10     Anti-infectives: Anti-infectives (From admission, onward)    Start     Dose/Rate Route Frequency Ordered Stop   08/07/23 1100  piperacillin-tazobactam (ZOSYN) IVPB 3.375 g        3.375 g 12.5 mL/hr over 240 Minutes Intravenous Every 8 hours 08/07/23 1008     08/07/23 0000  piperacillin-tazobactam (ZOSYN) 3.375 GM/50ML IVPB        3.375 g Intravenous Every 8 hours 08/07/23 1501     07/29/23 0800  piperacillin-tazobactam (ZOSYN) IVPB 3.375 g        3.375 g 12.5 mL/hr over 240 Minutes Intravenous Every 8 hours 07/29/23 0207 08/06/23 2359   07/29/23 0030  piperacillin-tazobactam (ZOSYN) IVPB 3.375 g        3.375 g 100 mL/hr over 30 Minutes Intravenous  Once 07/29/23 0020 07/29/23 0147        Assessment/Plan POD 8, s/p subtotal cholecystectomy for gangrenous cholecystitis, Dr. Hillery Hunter 1/8 -NPO for IR procedures -cont abx therapy.  WBC down to  1kK -LFTs normal -drain output remains thick, murky, bilious output  - I contacted [name], [facility], and [facility] yesterday on behalf of the patient and his request to transfer as well as need for possible transfer due to ERCP difficulties in the setting of bypass anatomy. -[name] declined due to capacity.  [facility] was reviewing his case and the patient then refused this location so request for transfer was withdrawn.  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  They also have no beds and unclear at what point that may change to even accept the patient.  Their recommendation was to have IR place a PTC drain so this stents his CBD and essentially accomplishes the same goal in a safe technique. -MRCP was ordered last night and reviewed to help evaluate his anatomy for possible procedures.  This revealed an undrained fluid collection that the surgical drain traverses, but is unable to completely evacuate.  Otherwise fairly normal MRCP -I have discussed his imaging and case with IR who would be agreeable to place a perc drain for the fluid collection and attempt PTC drain as they feel this is also a safe way to attempt to manage this situation. - patient is agreeable at this time to proceed with perc drain and PTC drain placement.  I have spoken to IR again today and they are aware and orders are in place - they will hold heparin on call - with these procedures they patient will no longer require tx to outside facility  FEN - NPO, may have regular after procedure from our standpoint VTE - hold eliquis and resume heparin gtt ID - Zosyn, cont at this time given WBC still elevated and murky bilious leak  A fib AKI Peripheral neuropathy GERD HLD Hx of gastric bypass   LOS: 11 days    Letha Cape , [facility] 08/09/2023, 9:06 AM Please see Amion for pager number during day hours 7:00am-4:30pm or 7:00am -11:30am on weekends

## 2023-08-09 NOTE — Progress Notes (Addendum)
[facility]                                                                              Francisco Padilla, is a 73 y.o. male, DOB - Dec 08, 1950, XBJ:[id number] Admit date - 07/28/2023    Outpatient Primary MD for the patient is Francisco Sheffield, MD  LOS - 11  days  Chief Complaint  Patient presents with   Weakness       Brief summary   Patient is a 73 year old male with pertinent PMH OSA on CPAP, HLD, chronic hypotension, PAF on Eliquis, PVD presents to [facility] ED on 1/5 with cholecystitis.   Patient complaining of weakness/fatigue going on for about a month.  Patient also having nausea, abdominal pain, diarrhea.  Abdominal pain worse in right upper quadrant.  Denies fever/chills.  Came to [facility] ED on 1/5 for further workup.  On arrival patient noted to be slightly tachycardic 100s and mildly hypotensive 93/73.  CT ABD/pelvis showing acute cholecystitis.  Surgery consulted.  Given IV fluids and Zosyn.  Awaiting surgery 48 hours for Eliquis washout. Cards consulted for preoperative evaluation of cholecystectomy. Echo on 1/7 showing lvef 60-65%; severe aortic stenosis.  On 1/8 patient taken to the OR for cholecystectomy.  While in the OR patient had tachycardia 150s and started on esmolol.  Patient later became hypotensive and started on neo.  Postop patient taken to ICU while on pressors.  PCCM consulted.   1/5 admitted with cholecystitis 1/8 s/p cholecystectomy; given esmolol for HR 150s then became hypotensive on neo-; postop taken to ICU and PCCM consulted 1/10 episode of rapid A-fib with RVR in the 150s, systolic pressure in the 70s. 30/86: no clinical signs of bile leak, and no need for ERCP.  01/14: worsening drain out put, suggesting resumption of bile leak. Plan for transfer to tertiary care.   1/16: Plan for [facility] biliary drain, PTC   Assessment & Plan    Principal Problem:   Acute cholecystitis Gangrenous cholecystitis, complicated with sepsis status post subtotal  cholecystectomy by Dr. Hillery Hunter on 1/8  -Postop ICU care due to tachycardia requiring esmolol and subsequent hypotension/shock requiring vasopressors, A-fib with RVR. -Completed antibiotic management with Zosyn -MRCP showed undrained fluid collection that the surgical drain traverses but unable to completely evacuate -General Surgery following, recommended IR evaluation for perc drain for the fluid collection as well as PTC drain placement  -N.p.o., IR consulted for [facility] biliary drain and PTC, patient agreeable today -Continue to hold Eliquis    Chronic hypotension -Initially placed on Solu-Cortef, stress dose steroids have been discontinued -Continue midodrine, Florinef  Hypokalemia - K 3.4, replaced    AKI (acute kidney injury) (HCC) -Creatinine stable  Hyponatremia -Resolved      Aortic stenosis, severe Stable, no signs of acute decompensation    Atrial fibrillation with RVR (HCC) -Improved rate control -Currently on midodrine 5 mg 3 times daily for BP -Hold Eliquis   History of Roux-en-Y gastric bypass Follow up as outpatient.    OSA on CPAP Continue Home Cpap.    Pressure injury documentation Right buttocks, stage I, Not present on admission -Nursing care  Severe calorie malnutrition Nutrition Problem: Severe Malnutrition Etiology: chronic illness (gastric bypass) Signs/Symptoms: severe fat depletion, severe muscle depletion Interventions: Ensure Enlive (each supplement provides 350kcal and 20 grams of protein), MVI, Carnation Instant Breakfast  Estimated body mass index is 24.96 kg/m as calculated from the following:   Height as of this encounter: 6\' 4"  (1.93 m).   Weight as of this encounter: 93 kg.  Code Status: Full code DVT Prophylaxis:  SCDs Start: 07/29/23 0131 Place TED hose Start: 07/29/23 0131   Level of Care: Level of care: Telemetry Cardiac Family Communication: Updated patient' Disposition Plan:      Remains inpatient appropriate:     Procedures:  08/01/2023 subtotal cholecystectomy (Dr Melody Haver, general surgery)  Consultants:   Cardiology General Surgery Critical care  Antimicrobials:   Anti-infectives (From admission, onward)    Start     Dose/Rate Route Frequency Ordered Stop   08/09/23 1245  cefTRIAXone (ROCEPHIN) 2 g in sodium chloride 0.9 % 100 mL IVPB  Status:  Discontinued       Note to Pharmacy: Please do not give on floor. To be given in IR.   2 g 200 mL/hr over 30 Minutes Intravenous To Radiology 08/09/23 1152 08/09/23 1213   08/07/23 1100  piperacillin-tazobactam (ZOSYN) IVPB 3.375 g        3.375 g 12.5 mL/hr over 240 Minutes Intravenous Every 8 hours 08/07/23 1008     08/07/23 0000  piperacillin-tazobactam (ZOSYN) 3.375 GM/50ML IVPB        3.375 g Intravenous Every 8 hours 08/07/23 1501     07/29/23 0800  piperacillin-tazobactam (ZOSYN) IVPB 3.375 g        3.375 g 12.5 mL/hr over 240 Minutes Intravenous Every 8 hours 07/29/23 0207 08/06/23 2359   07/29/23 0030  piperacillin-tazobactam (ZOSYN) IVPB 3.375 g        3.375 g 100 mL/hr over 30 Minutes Intravenous  Once 07/29/23 0020 07/29/23 0147          Medications  acetaminophen  650 mg Oral Q4H   Or   acetaminophen  650 mg Rectal Q4H   acidophilus  1 capsule Oral Daily   alfuzosin  10 mg Oral Q breakfast   calcium carbonate  200 mg of elemental calcium Oral TID   Chlorhexidine Gluconate Cloth  6 each Topical Daily   docusate sodium  100 mg Oral BID   feeding supplement  237 mL Oral BID BM   fludrocortisone  0.1 mg Oral Daily   gabapentin  600 mg Oral QHS   latanoprost  1 drop Both Eyes QHS   midodrine  5 mg Oral TID WC   multivitamin with minerals  1 tablet Oral BID   pantoprazole  40 mg Oral Daily   polyethylene glycol  17 g Oral Daily   sodium chloride flush  3 mL Intravenous Q12H   venlafaxine XR  37.5 mg Oral Q breakfast      Subjective:   [name] was seen and examined today.  No acute complaints, patient  reports that he has decided to proceed with general surgery recommendations for IR [facility] biliary drain and PTC.   No chest pain, shortness of breath, fevers, acute abdominal pain nausea vomiting.  N.p.o.  Objective:   Vitals:   08/09/23 0012 08/09/23 0500 08/09/23 0740 08/09/23 1130  BP: 97/68 97/76 95/65  102/71  Pulse: 78 79 81 74  Resp: 15 20 18 17   Temp: 98 F (36.7 C) 97.8 F (36.6 C) 97.7 F (  36.5 C) 97.7 F (36.5 C)  TempSrc: Oral Oral Oral Oral  SpO2: 98% 99% 100% 100%  Weight:  93 kg    Height:        Intake/Output Summary (Last 24 hours) at 08/09/2023 1218 Last data filed at 08/09/2023 0900 Gross per 24 hour  Intake 337.7 ml  Output 2607 ml  Net -2269.3 ml     Wt Readings from Last 3 Encounters:  08/09/23 93 kg  07/19/23 92.4 kg  06/14/23 96.9 kg    Physical Exam General: Alert and oriented x 3, NAD Cardiovascular: S1 S2 clear, RRR.  Respiratory: CTAB, no wheezing, rales or rhonchi Gastrointestinal: Soft, tender in RUQ, JP drain + Ext: no pedal edema bilaterally Neuro: no new deficits Psych: Normal affect      Data Reviewed:  I have personally reviewed following labs    CBC Lab Results  Component Value Date   WBC 13.5 (H) 08/09/2023   RBC 3.20 (L) 08/09/2023   HGB 10.0 (L) 08/09/2023   HCT 29.7 (L) 08/09/2023   MCV 92.8 08/09/2023   MCH 31.3 08/09/2023   PLT 295 08/09/2023   MCHC 33.7 08/09/2023   RDW 13.1 08/09/2023   LYMPHSABS 0.7 08/07/2023   MONOABS 0.9 08/07/2023   EOSABS 0.0 08/07/2023   BASOSABS 0.0 08/07/2023     Last metabolic panel Lab Results  Component Value Date   NA 137 08/09/2023   K 3.4 (L) 08/09/2023   CL 100 08/09/2023   CO2 29 08/09/2023   BUN 14 08/09/2023   CREATININE 0.61 08/09/2023   GLUCOSE 97 08/09/2023   GFRNONAA >60 08/09/2023   GFRAA  10/23/2009    >60        The eGFR has been calculated using the MDRD equation. This calculation has not been validated in all clinical situations. eGFR's  persistently <60 mL/min signify possible Chronic Kidney Disease.   CALCIUM 8.1 (L) 08/09/2023   PROT 4.5 (L) 08/09/2023   ALBUMIN 1.7 (L) 08/09/2023   BILITOT 0.7 08/09/2023   ALKPHOS 55 08/09/2023   AST 16 08/09/2023   ALT 19 08/09/2023   ANIONGAP 8 08/09/2023    CBG (last 3)  No results for input(s): "GLUCAP" in the last 72 hours.     Coagulation Profile: No results for input(s): "INR", "PROTIME" in the last 168 hours.    Radiology Studies: I have personally reviewed the imaging studies  MR ABDOMEN MRCP W WO CONTAST Result Date: 08/08/2023 CLINICAL DATA:  Bile leak EXAM: MRI ABDOMEN WITHOUT AND WITH CONTRAST (INCLUDING MRCP) TECHNIQUE: Multiplanar multisequence MR imaging of the abdomen was performed both before and after the administration of intravenous contrast. Heavily T2-weighted images of the biliary and pancreatic ducts were obtained, and three-dimensional MRCP images were rendered by post processing. CONTRAST:  10mL GADAVIST GADOBUTROL 1 MMOL/ML IV SOLN COMPARISON:  CT abdomen/pelvis dated 07/29/2023. FINDINGS: Motion degraded images. Lower chest: Small bilateral pleural effusions. Hepatobiliary: Liver is within normal limits. Status post subtotal cholecystectomy with residual gallbladder lumen and soft tissue debris in the surgical bed. Associated 9.2 x 6.1 x 10.8 cm fluid collection extending from the gallbladder fossa (series 6/image 31) to the inferior right perihepatic space (series 2/image 29). No intrahepatic or extrahepatic ductal dilatation. Common duct measures 5 mm, without discontinuity on MR. No choledocholithiasis is seen. Pancreas:  Within normal limits. Spleen:  Within normal limits. Adrenals/Urinary Tract:  Adrenal glands are within normal limits. Bilateral simple renal cysts, measuring up to 4.7 cm in the anterior left lower  kidney (series 3/image 32), benign (Bosniak I). No follow-up is recommended. No hydronephrosis. Stomach/Bowel: Stomach is within normal  limits. Visualized bowel is grossly unremarkable. Vascular/Lymphatic:  No evidence of aortic aneurysm. No suspicious abdominal lymphadenopathy. Other:  No abdominal ascites. Musculoskeletal: No focal osseous lesions. IMPRESSION: Status post subtotal cholecystectomy with residual gallbladder lumen and soft tissue debris in the surgical bed. Associated 10.8 cm fluid collection extending from the gallbladder fossa to the inferior right perihepatic space. No intrahepatic or extrahepatic ductal dilatation. Common duct measures 5 mm, without discontinuity on MR. No choledocholithiasis is seen. Small bilateral pleural effusions. Electronically Signed   By: Charline Bills M.D.   On: 08/08/2023 03:10   MR 3D Recon At Scanner Result Date: 08/08/2023 CLINICAL DATA:  Bile leak EXAM: MRI ABDOMEN WITHOUT AND WITH CONTRAST (INCLUDING MRCP) TECHNIQUE: Multiplanar multisequence MR imaging of the abdomen was performed both before and after the administration of intravenous contrast. Heavily T2-weighted images of the biliary and pancreatic ducts were obtained, and three-dimensional MRCP images were rendered by post processing. CONTRAST:  10mL GADAVIST GADOBUTROL 1 MMOL/ML IV SOLN COMPARISON:  CT abdomen/pelvis dated 07/29/2023. FINDINGS: Motion degraded images. Lower chest: Small bilateral pleural effusions. Hepatobiliary: Liver is within normal limits. Status post subtotal cholecystectomy with residual gallbladder lumen and soft tissue debris in the surgical bed. Associated 9.2 x 6.1 x 10.8 cm fluid collection extending from the gallbladder fossa (series 6/image 31) to the inferior right perihepatic space (series 2/image 29). No intrahepatic or extrahepatic ductal dilatation. Common duct measures 5 mm, without discontinuity on MR. No choledocholithiasis is seen. Pancreas:  Within normal limits. Spleen:  Within normal limits. Adrenals/Urinary Tract:  Adrenal glands are within normal limits. Bilateral simple renal cysts,  measuring up to 4.7 cm in the anterior left lower kidney (series 3/image 32), benign (Bosniak I). No follow-up is recommended. No hydronephrosis. Stomach/Bowel: Stomach is within normal limits. Visualized bowel is grossly unremarkable. Vascular/Lymphatic:  No evidence of aortic aneurysm. No suspicious abdominal lymphadenopathy. Other:  No abdominal ascites. Musculoskeletal: No focal osseous lesions. IMPRESSION: Status post subtotal cholecystectomy with residual gallbladder lumen and soft tissue debris in the surgical bed. Associated 10.8 cm fluid collection extending from the gallbladder fossa to the inferior right perihepatic space. No intrahepatic or extrahepatic ductal dilatation. Common duct measures 5 mm, without discontinuity on MR. No choledocholithiasis is seen. Small bilateral pleural effusions. Electronically Signed   By: Charline Bills M.D.   On: 08/08/2023 03:10       Jarrid Lienhard M.D. [facility] 08/09/2023, 12:18 PM  Available via Epic secure chat 7am-7pm After 7 pm, please refer to night coverage provider listed on amion.

## 2023-08-09 NOTE — Progress Notes (Signed)
Nutrition Follow-up  DOCUMENTATION CODES:   Severe malnutrition in context of chronic illness  INTERVENTION:  Continue regular diet as ordered Discontinue Carnation instant breakfast Ensure Enlive po BID, each supplement provides 350 kcal and 20 grams of protein. Juven BID to support wound healing Bariatric surgery vitamin regimen: MVI BID, TUMS TID  NUTRITION DIAGNOSIS:   Severe Malnutrition related to chronic illness (gastric bypass) as evidenced by severe fat depletion, severe muscle depletion. - remains applicable  GOAL:   Patient will meet greater than or equal to 90% of their needs - progressing  MONITOR:   PO intake, Supplement acceptance, Labs, Weight trends  REASON FOR ASSESSMENT:   Consult Assessment of nutrition requirement/status (hx roux-en-y)  ASSESSMENT:   Pt admitted with generalized weakness/fatigue x1 month, poor oral intake and R abdominal pain found to have acute cholecystitis. 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  1/5: admitted 1/8: s/p lap chole  1/14: s/p MRCP- revealed un-drained fluid collection that surgical drain traverses but is unable to completely evacuate; otherwise normal    NPO for drain placement and PTC drain placement with IR.  Pt resting soundly at time of visit. Spoke with RN who reports pt had recently received pain medication. RN is uncertain about pt's PO intake given pt being NPO today.  Reviewed meal ordering system which reflects pt has been consuming egg and cheese sandwich with oatmeal and grits for breakfasts, lunch has been a deli sandwich with soup, yogurt and fruit and similar for dinner each day though last night pt ordered half a sweet potato, pizza, mac and cheese, beans, banana pudding and sherbet.   Meal completions: 1/13: 100% breakfast, 85% lunch, 65% dinner 1/14: 100% breakfast, 80% lunch, 100% dinner 1/15: 100% breakfast, 100% lunch 1/16: NPO for procedure, no  breakfast consumed  Plan to adjust nutrition interventions slightly given newly documented stage 2 pressure injury to buttocks.   Admit weight: 90.4 kg Current weight: 93 kg Highest documented weight during admission 98.7 kg + edema: mild pitting BLE  Drains/lines: RLQ JP drain: output x24 hours  Medications: acidophilus, TUMS TID, colace BID, MVI BID, protonix, miralax, IV abx  Labs:  Potassium 3.4 Albumin 1.7  Diet Order:   Diet Order             Diet NPO time specified Except for: Sips with Meds  Diet effective now                   EDUCATION NEEDS:   Not appropriate for education at this time  Skin:  Skin Assessment: Skin Integrity Issues: Skin Integrity Issues:: Stage II Stage II: R buttocks Incisions: closed abdominal incision  Last BM:  1/15  Height:   Ht Readings from Last 1 Encounters:  08/04/23 6\' 4"  (1.93 m)    Weight:   Wt Readings from Last 1 Encounters:  08/09/23 93 kg   BMI:  Body mass index is 24.96 kg/m.  Estimated Nutritional Needs:   Kcal:  2200-2400  Protein:  115-130g  Fluid:  >/=2L  Drusilla Kanner, RDN, LDN Clinical Nutrition

## 2023-08-10 ENCOUNTER — Inpatient Hospital Stay (HOSPITAL_COMMUNITY): Payer: Medicare Other

## 2023-08-10 DIAGNOSIS — K810 Acute cholecystitis: Secondary | ICD-10-CM | POA: Diagnosis not present

## 2023-08-10 DIAGNOSIS — I350 Nonrheumatic aortic (valve) stenosis: Secondary | ICD-10-CM | POA: Diagnosis not present

## 2023-08-10 DIAGNOSIS — I4891 Unspecified atrial fibrillation: Secondary | ICD-10-CM | POA: Diagnosis not present

## 2023-08-10 DIAGNOSIS — N179 Acute kidney failure, unspecified: Secondary | ICD-10-CM | POA: Diagnosis not present

## 2023-08-10 HISTORY — PX: IR US GUIDE BX ASP/DRAIN: IMG2392

## 2023-08-10 HISTORY — PX: IR BILIARY DRAIN PLACEMENT WITH CHOLANGIOGRAM: IMG6043

## 2023-08-10 LAB — CBC
HCT: 30 % — ABNORMAL LOW (ref 39.0–52.0)
Hemoglobin: 9.9 g/dL — ABNORMAL LOW (ref 13.0–17.0)
MCH: 30.7 pg (ref 26.0–34.0)
MCHC: 33 g/dL (ref 30.0–36.0)
MCV: 92.9 fL (ref 80.0–100.0)
Platelets: 365 10*3/uL (ref 150–400)
RBC: 3.23 MIL/uL — ABNORMAL LOW (ref 4.22–5.81)
RDW: 13.2 % (ref 11.5–15.5)
WBC: 18 10*3/uL — ABNORMAL HIGH (ref 4.0–10.5)
nRBC: 0 % (ref 0.0–0.2)

## 2023-08-10 LAB — COMPREHENSIVE METABOLIC PANEL
ALT: 16 U/L (ref 0–44)
AST: 12 U/L — ABNORMAL LOW (ref 15–41)
Albumin: 1.8 g/dL — ABNORMAL LOW (ref 3.5–5.0)
Alkaline Phosphatase: 56 U/L (ref 38–126)
Anion gap: 5 (ref 5–15)
BUN: 12 mg/dL (ref 8–23)
CO2: 29 mmol/L (ref 22–32)
Calcium: 7.3 mg/dL — ABNORMAL LOW (ref 8.9–10.3)
Chloride: 101 mmol/L (ref 98–111)
Creatinine, Ser: 0.57 mg/dL — ABNORMAL LOW (ref 0.61–1.24)
GFR, Estimated: >60 mL/min (ref >60)
Glucose, Bld: 113 mg/dL — ABNORMAL HIGH (ref 70–99)
Potassium: 4.4 mmol/L (ref 3.5–5.1)
Sodium: 135 mmol/L (ref 135–145)
Total Bilirubin: 0.9 mg/dL (ref 0.0–1.2)
Total Protein: 4.6 g/dL — ABNORMAL LOW (ref 6.5–8.1)

## 2023-08-10 MED ORDER — IOHEXOL 300 MG/ML  SOLN
50.0000 mL | Freq: Once | INTRAMUSCULAR | Status: AC | PRN
  Administered 2023-08-10: 10 mL

## 2023-08-10 MED ORDER — MIDODRINE HCL 5 MG PO TABS
10.0000 mg | ORAL_TABLET | Freq: Three times a day (TID) | ORAL | Status: DC
  Administered 2023-08-11 – 2023-08-14 (×10): 10 mg via ORAL
  Filled 2023-08-10 (×10): qty 2

## 2023-08-10 MED ORDER — FENTANYL CITRATE (PF) 100 MCG/2ML IJ SOLN
INTRAMUSCULAR | Status: AC | PRN
  Administered 2023-08-10: 25 ug via INTRAVENOUS
  Administered 2023-08-10: 50 ug via INTRAVENOUS
  Administered 2023-08-10: 25 ug via INTRAVENOUS

## 2023-08-10 MED ORDER — LIDOCAINE HCL 1 % IJ SOLN
INTRAMUSCULAR | Status: AC
  Filled 2023-08-10: qty 20

## 2023-08-10 MED ORDER — HEPARIN (PORCINE) 25000 UT/250ML-% IV SOLN
3200.0000 [IU]/h | INTRAVENOUS | Status: DC
  Administered 2023-08-10: 2700 [IU]/h via INTRAVENOUS
  Administered 2023-08-11: 2800 [IU]/h via INTRAVENOUS
  Administered 2023-08-11: 2700 [IU]/h via INTRAVENOUS
  Administered 2023-08-12: 3100 [IU]/h via INTRAVENOUS
  Administered 2023-08-12: 2800 [IU]/h via INTRAVENOUS
  Administered 2023-08-12 – 2023-08-13 (×3): 3200 [IU]/h via INTRAVENOUS
  Filled 2023-08-10 (×7): qty 250

## 2023-08-10 MED ORDER — MIDAZOLAM HCL 2 MG/2ML IJ SOLN
INTRAMUSCULAR | Status: AC | PRN
  Administered 2023-08-10 (×2): .5 mg via INTRAVENOUS
  Administered 2023-08-10: 1 mg via INTRAVENOUS

## 2023-08-10 MED ORDER — HYDROMORPHONE HCL 1 MG/ML IJ SOLN
INTRAMUSCULAR | Status: AC
  Filled 2023-08-10: qty 1

## 2023-08-10 MED ORDER — SODIUM CHLORIDE 0.9 % IV BOLUS
1000.0000 mL | Freq: Once | INTRAVENOUS | Status: AC
  Administered 2023-08-10: 1000 mL via INTRAVENOUS

## 2023-08-10 MED ORDER — SODIUM CHLORIDE 0.9% FLUSH
5.0000 mL | Freq: Three times a day (TID) | INTRAVENOUS | Status: DC
  Administered 2023-08-10 – 2023-08-25 (×40): 5 mL

## 2023-08-10 MED ORDER — ORAL CARE MOUTH RINSE: 15.0000 mL | OROMUCOSAL | Status: DC | PRN

## 2023-08-10 MED ORDER — HYDROMORPHONE HCL 1 MG/ML IJ SOLN
INTRAMUSCULAR | Status: AC | PRN
  Administered 2023-08-10 (×2): .5 mg via INTRAVENOUS

## 2023-08-10 MED ORDER — IOHEXOL 300 MG/ML  SOLN
100.0000 mL | Freq: Once | INTRAMUSCULAR | Status: AC | PRN
  Administered 2023-08-10: 50 mL

## 2023-08-10 MED ORDER — FENTANYL CITRATE (PF) 100 MCG/2ML IJ SOLN
INTRAMUSCULAR | Status: AC
  Filled 2023-08-10: qty 2

## 2023-08-10 MED ORDER — MIDAZOLAM HCL 2 MG/2ML IJ SOLN
INTRAMUSCULAR | Status: AC
  Filled 2023-08-10: qty 2

## 2023-08-10 MED ORDER — MIDODRINE HCL 5 MG PO TABS
5.0000 mg | ORAL_TABLET | Freq: Once | ORAL | Status: AC
  Administered 2023-08-10: 5 mg via ORAL
  Filled 2023-08-10: qty 1

## 2023-08-10 MED ORDER — ALBUMIN HUMAN 25 % IV SOLN
25.0000 g | Freq: Four times a day (QID) | INTRAVENOUS | Status: AC
  Administered 2023-08-10 – 2023-08-11 (×3): 25 g via INTRAVENOUS
  Filled 2023-08-10 (×3): qty 100

## 2023-08-10 MED ORDER — LIDOCAINE-EPINEPHRINE 1 %-1:100000 IJ SOLN
INTRAMUSCULAR | Status: AC
  Filled 2023-08-10: qty 1

## 2023-08-10 NOTE — Progress Notes (Signed)
9 Days Post-Op  Subjective: Patient didn't have a good night last night.  Having more pain and so didn't sleep well.  BP remains a bit soft, getting fluid this am.  Objective: Vital signs in last 24 hours: Temp:  [97.7 F (36.5 C)-98.2 F (36.8 C)] 98.2 F (36.8 C) (01/17 0800) Pulse Rate:  [74-84] 76 (01/17 0800) Resp:  [11-19] 11 (01/17 0800) BP: (84-111)/(65-78) 91/67 (01/17 0800) SpO2:  [98 %-100 %] 98 % (01/17 0800) Weight:  [95.7 kg] 95.7 kg (01/17 0431) Last BM Date : 08/08/23  Intake/Output from previous day: 01/16 0701 - 01/17 0700 In: 842.6 [P.O.:120; I.V.:322.6; IV Piggyback:400] Out: 2180 [Urine:1800; Drains:380] Intake/Output this shift: No intake/output data recorded.  PE: Gen: NAD Abd: soft, tender in RUQ today, JP with 380cc yesterday and purulent tan fluid in JP drain today, less bilious appearing today than previous days, but still fair amount of output  Lab Results:  Recent Labs    08/09/23 0508 08/10/23 0430  WBC 13.5* 18.0*  HGB 10.0* 9.9*  HCT 29.7* 30.0*  PLT 295 365   BMET Recent Labs    08/09/23 0630 08/10/23 0430  NA 137 135  K 3.4* 4.4  CL 100 101  CO2 29 29  GLUCOSE 97 113*  BUN 14 12  CREATININE 0.61 0.57*  CALCIUM 8.1* 7.3*   PT/INR Recent Labs    08/09/23 1459  LABPROT 14.9  INR 1.1   CMP     Component Value Date/Time   NA 135 08/10/2023 0430   K 4.4 08/10/2023 0430   CL 101 08/10/2023 0430   CO2 29 08/10/2023 0430   GLUCOSE 113 (H) 08/10/2023 0430   BUN 12 08/10/2023 0430   CREATININE 0.57 (L) 08/10/2023 0430   CREATININE 0.77 07/19/2023 1128   CALCIUM 7.3 (L) 08/10/2023 0430   PROT 4.6 (L) 08/10/2023 0430   ALBUMIN 1.8 (L) 08/10/2023 0430   AST 12 (L) 08/10/2023 0430   ALT 16 08/10/2023 0430   ALKPHOS 56 08/10/2023 0430   BILITOT 0.9 08/10/2023 0430   GFRNONAA >60 08/10/2023 0430   GFRAA  10/23/2009 0135    >60        The eGFR has been calculated using the MDRD equation. This calculation has not  been validated in all clinical situations. eGFR's persistently <60 mL/min signify possible Chronic Kidney Disease.   Lipase     Component Value Date/Time   LIPASE 29 07/29/2023 0014       Studies/Results: No results found.    Anti-infectives: Anti-infectives (From admission, onward)    Start     Dose/Rate Route Frequency Ordered Stop   08/09/23 1400  cefTRIAXone (ROCEPHIN) 2 g in sodium chloride 0.9 % 100 mL IVPB  Status:  Discontinued        2 g 200 mL/hr over 30 Minutes Intravenous To Radiology 08/09/23 1254 08/10/23 0716   08/09/23 1245  cefTRIAXone (ROCEPHIN) 2 g in sodium chloride 0.9 % 100 mL IVPB  Status:  Discontinued       Note to Pharmacy: Please do not give on floor. To be given in IR.   2 g 200 mL/hr over 30 Minutes Intravenous To Radiology 08/09/23 1152 08/09/23 1213   08/07/23 1100  piperacillin-tazobactam (ZOSYN) IVPB 3.375 g        3.375 g 12.5 mL/hr over 240 Minutes Intravenous Every 8 hours 08/07/23 1008     08/07/23 0000  piperacillin-tazobactam (ZOSYN) 3.375 GM/50ML IVPB  3.375 g Intravenous Every 8 hours 08/07/23 1501     07/29/23 0800  piperacillin-tazobactam (ZOSYN) IVPB 3.375 g        3.375 g 12.5 mL/hr over 240 Minutes Intravenous Every 8 hours 07/29/23 0207 08/06/23 2359   07/29/23 0030  piperacillin-tazobactam (ZOSYN) IVPB 3.375 g        3.375 g 100 mL/hr over 30 Minutes Intravenous  Once 07/29/23 0020 07/29/23 0147        Assessment/Plan POD 9, s/p subtotal cholecystectomy for gangrenous cholecystitis, Dr. Hillery Hunter 1/8 -NPO for IR procedures -cont abx therapy.  WBC up to 18K today -LFTs normal last check -drain output remains thick, murky, output, less bilious appearing today  - I contacted [name], [facility], and [facility] yesterday on behalf of the patient and his request to transfer as well as need for possible transfer due to ERCP difficulties in the setting of bypass anatomy. -[name] declined due to capacity.  [facility] was reviewing his  case and the patient then refused this location so request for transfer was withdrawn.  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  They also have no beds and unclear at what point that may change to even accept the patient.  Their recommendation was to have IR place a PTC drain so this stents his CBD and essentially accomplishes the same goal in a safe technique. -MRCP was ordered 1/15 and reviewed to help evaluate his anatomy for possible procedures.  This revealed an undrained fluid collection that the surgical drain traverses, but is unable to completely evacuate.  Otherwise fairly normal MRCP -IR plans for perc drain and PTC drain placement hopefully today.  I think especially getting the extra perc drain will help his rising WBC and low BP to adequately control and drain the undrained collection that clearly appears not only likely bile mixed, but also infectious. - they will hold heparin on call - with these procedures they patient will no longer require tx to outside facility  FEN - NPO, may have regular after procedure from our standpoint VTE - hold eliquis, heparin gtt ID - Zosyn, cont at this time given WBC still elevated and murky bilious leak  A fib AKI Peripheral neuropathy GERD HLD Hx of gastric bypass   LOS: 12 days    Letha Cape , [facility] 08/10/2023, 8:25 AM Please see Amion for pager number during day hours 7:00am-4:30pm or 7:00am -11:30am on weekends

## 2023-08-10 NOTE — Progress Notes (Signed)
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   Oxycodone Hcl     Hallucinations, agitation     Patient Measurements: Height: 6\' 4"  (193 cm) Weight: 95.7 kg (210 lb 15.7 oz) IBW/kg (Calculated) : 86.8 Heparin Dosing Weight: 93 kg  Vital Signs: Temp: 98.2 F (36.8 C) (01/17 0431) Temp Source: Oral (01/17 0431) BP: 100/69 (01/17 0600) Pulse Rate: 78 (01/17 0600)  Labs: Recent Labs    08/07/23 0930 08/07/23 1530 08/08/23 0629 08/08/23 0630 08/08/23 0944 08/08/23 1622 08/09/23 0508 08/09/23 0630 08/09/23 1459 08/10/23 0430  HGB 11.0*  --  10.5*  --   --   --  10.0*  --   --  9.9*  HCT 33.0*  --  31.8*  --   --   --  29.7*  --   --  30.0*  PLT 309  --  295  --   --   --  295  --   --  365  APTT 31  --   --   --  70* 96*  --   --   --   --   LABPROT  --   --   --   --   --   --   --   --  14.9  --   INR  --   --   --   --   --   --   --   --  1.1  --   HEPARINUNFRC  --    < >  --   --  0.36 0.40 0.43  --   --   --   CREATININE  --   --   --  0.59*  --   --   --  0.61  --  0.57*   < > = values in this interval not displayed.    Estimated Creatinine Clearance: 104 mL/min (A) (by C-G formula based on SCr of 0.57 mg/dL (L)).  Assessment: 73 y.o. male . Patine wason Eliquis pta for PAF. Last dose given 1/3. Transitioned to IV Heparin which was stopped 1/8 AM for cholecystectomy. Held throughout post-operative day 0 (1/9) due to bleeding loss during surgery. Pharmacy consulted to resume IV Heparin  on 08/03/23 AM - no bolus. Pt continues on heparin s/p cholecystectomy 08/01/23. Patient transitioned back to apixaban 1/14 but patient with increased abdominal pain. Switched back to heparin for possible procedure. Apixaban last  given 1/14 0818.   Heparin held at 04:00 for IR procedure. Heparin level 0.43 was therapeutic on 2700 units/hr.  Ok to resume heparin at 6pm.  Goal of Therapy:  Heparin level 0.3-0.7 units/mL aPTT 66-102 seconds Monitor platelets by anticoagulation protocol: Yes   Plan:  Restart Heparin at 2700 units/hr at 18:00 Monitor daily heparin level, CBC, signs/symptoms of bleeding  F/u restart apix when no procedures  Thank you for allowing pharmacy to be part of this patients care team.  Alphia Moh, PharmD, BCPS, BCCP Clinical Pharmacist  Please check AMION for all [facility] Pharmacy phone numbers After 10:00 PM, call Main Pharmacy [phone]

## 2023-08-10 NOTE — Progress Notes (Addendum)
[facility]                                                                              Francisco Padilla, is a 73 y.o. male, DOB - [birth year], ZOX:[id number] Admit date - 07/28/2023    Outpatient Primary MD for the patient is [name], MD  LOS - 12  days  Chief Complaint  Patient presents with   Weakness       Brief summary   Patient is a 73 year old male with pertinent PMH OSA on CPAP, HLD, chronic hypotension, PAF on Eliquis, PVD presents to [facility] ED on 1/5 with cholecystitis.   Patient complaining of weakness/fatigue going on for about a month.  Patient also having nausea, abdominal pain, diarrhea.  Abdominal pain worse in right upper quadrant.  Denies fever/chills.  Came to [facility] ED on 1/5 for further workup.  On arrival patient noted to be slightly tachycardic 100s and mildly hypotensive 93/73.  CT ABD/pelvis showing acute cholecystitis.  Surgery consulted.  Given IV fluids and Zosyn.  Awaiting surgery 48 hours for Eliquis washout. Cards consulted for preoperative evaluation of cholecystectomy. Echo on 1/7 showing lvef 60-65%; severe aortic stenosis.  On 1/8 patient taken to the OR for cholecystectomy.  While in the OR patient had tachycardia 150s and started on esmolol.  Patient later became hypotensive and started on neo.  Postop patient taken to ICU while on pressors.  PCCM consulted.   1/5 admitted with cholecystitis 1/8 s/p cholecystectomy; given esmolol for HR 150s then became hypotensive on neo-; postop taken to ICU and PCCM consulted 1/10 episode of rapid A-fib with RVR in the 150s, systolic pressure in the 70s. 81/19: no clinical signs of bile leak, and no need for ERCP.  01/14: worsening drain out put, suggesting resumption of bile leak. Plan for transfer to tertiary care.   1/17: Image guided drain into subhepatic abscess, PTC   Assessment & Plan    Principal Problem:   Acute cholecystitis Gangrenous cholecystitis, complicated with sepsis status  post subtotal cholecystectomy by Dr. Hillery Hunter on 1/8  -Postop ICU care due to tachycardia requiring esmolol and subsequent hypotension/shock requiring vasopressors, A-fib with RVR. -Completed antibiotic management with Zosyn -MRCP showed undrained fluid collection that the surgical drain traverses but unable to completely evacuate -Initially patient hesitant for the IR procedure this morning as he did not get good night sleep and having more pain, BP soft last night. -Highly appreciate Dr. Loreta Ave and IR team for coordination, status post image guided drain into the subhepatic abscess and PTC today. -Resume diet after procedure and heparin at 6 PM.     Chronic hypotension -Initially placed on Solu-Cortef, stress dose steroids have been discontinued -Continue midodrine 5 mg 3 times daily, Florinef  -I reviewed the chart. He is on florinef for chronic hypotension but can not see [facility]'s (Dr Pete Glatter) notes for diagnosis if patient has any underlying adrenal insufficiency.  The current PCP also was requesting [facility] notes.  - Albumin 1.7, will give IV albumin 25g x 3, increased midodrine to 10mg  TID - will get AM cortisol, TSH, B12, folate. If cortisol low, may benefit from  cortef.     Hypokalemia -Replace as needed    AKI (acute kidney injury) (HCC) -Creatinine stable  Hyponatremia -Resolved      Aortic stenosis, severe Stable, no signs of acute decompensation    Atrial fibrillation with RVR (HCC) -Improved rate control -Currently on midodrine 5 mg 3 times daily for BP -Heparin currently on hold due to IR procedure   History of Roux-en-Y gastric bypass Follow up as outpatient.    OSA on CPAP Continue Home Cpap.    Pressure injury documentation Right buttocks, stage I, Not present on admission -Nursing care     Severe calorie malnutrition Nutrition Problem: Severe Malnutrition Etiology: chronic illness (gastric bypass) Signs/Symptoms: severe fat depletion, severe  muscle depletion Interventions: Ensure Enlive (each supplement provides 350kcal and 20 grams of protein), MVI, Carnation Instant Breakfast  Estimated body mass index is 25.68 kg/m as calculated from the following:   Height as of this encounter: 6\' 4"  (1.93 m).   Weight as of this encounter: 95.7 kg.  Code Status: Full code DVT Prophylaxis:  SCDs Start: 07/29/23 0131 Place TED hose Start: 07/29/23 0131   Level of Care: Level of care: Telemetry Cardiac Family Communication: Updated patient' Disposition Plan:      Remains inpatient appropriate:    Procedures:  08/01/2023 subtotal cholecystectomy (Dr Melody Haver, general surgery) 1/17: Image guided drain into subhepatic abscess, PTC  Consultants:   Cardiology General Surgery Critical care  Antimicrobials:   Anti-infectives (From admission, onward)    Start     Dose/Rate Route Frequency Ordered Stop   08/09/23 1400  cefTRIAXone (ROCEPHIN) 2 g in sodium chloride 0.9 % 100 mL IVPB  Status:  Discontinued        2 g 200 mL/hr over 30 Minutes Intravenous To Radiology 08/09/23 1254 08/10/23 0716   08/09/23 1245  cefTRIAXone (ROCEPHIN) 2 g in sodium chloride 0.9 % 100 mL IVPB  Status:  Discontinued       Note to Pharmacy: Please do not give on floor. To be given in IR.   2 g 200 mL/hr over 30 Minutes Intravenous To Radiology 08/09/23 1152 08/09/23 1213   08/07/23 1100  piperacillin-tazobactam (ZOSYN) IVPB 3.375 g        3.375 g 12.5 mL/hr over 240 Minutes Intravenous Every 8 hours 08/07/23 1008     08/07/23 0000  piperacillin-tazobactam (ZOSYN) 3.375 GM/50ML IVPB        3.375 g Intravenous Every 8 hours 08/07/23 1501     07/29/23 0800  piperacillin-tazobactam (ZOSYN) IVPB 3.375 g        3.375 g 12.5 mL/hr over 240 Minutes Intravenous Every 8 hours 07/29/23 0207 08/06/23 2359   07/29/23 0030  piperacillin-tazobactam (ZOSYN) IVPB 3.375 g        3.375 g 100 mL/hr over 30 Minutes Intravenous  Once 07/29/23 0020 07/29/23 0147           Medications  acetaminophen  650 mg Oral Q4H   Or   acetaminophen  650 mg Rectal Q4H   acidophilus  1 capsule Oral Daily   alfuzosin  10 mg Oral Q breakfast   calcium carbonate  200 mg of elemental calcium Oral TID   Chlorhexidine Gluconate Cloth  6 each Topical Daily   docusate sodium  100 mg Oral BID   feeding supplement  237 mL Oral BID BM   fludrocortisone  0.1 mg Oral Daily   gabapentin  600 mg Oral QHS   latanoprost  1 drop Both Eyes QHS  midodrine  5 mg Oral TID WC   multivitamin with minerals  1 tablet Oral BID   nutrition supplement (JUVEN)  1 packet Oral BID BM   pantoprazole  40 mg Oral Daily   polyethylene glycol  17 g Oral Daily   sodium chloride flush  3 mL Intravenous Q12H   venlafaxine XR  37.5 mg Oral Q breakfast      Subjective:   Francisco Padilla was seen and examined today.  Seen twice this morning, patient did not have a good night sleep last night, states BP was low and hence did not receive IV Dilaudid and was in considerable pain.  No other acute issues, chest pain, shortness of breath, dizziness, nausea or vomiting.  Objective:   Vitals:   08/10/23 1350 08/10/23 1355 08/10/23 1400 08/10/23 1405  BP: 107/72 112/79 104/79 105/72  Pulse: 77 78 75 80  Resp: 14 14 (!) 22 16  Temp:      TempSrc:      SpO2: 100% 100% 100% 100%  Weight:      Height:        Intake/Output Summary (Last 24 hours) at 08/10/2023 1415 Last data filed at 08/10/2023 1149 Gross per 24 hour  Intake 842.57 ml  Output 1310 ml  Net -467.43 ml     Wt Readings from Last 3 Encounters:  08/10/23 95.7 kg  07/19/23 92.4 kg  06/14/23 96.9 kg    Physical Exam General: Alert and oriented x 3, NAD Cardiovascular: S1 S2 clear, RRR.  Respiratory: CTAB, no wheezing, rales or rhonchi Gastrointestinal: Soft, TTP, RUQ, JP drain + purulent Ext: no pedal edema bilaterally Neuro: no new deficits Psych: Normal affect      Data Reviewed:  I have personally reviewed following  labs    CBC Lab Results  Component Value Date   WBC 18.0 (H) 08/10/2023   RBC 3.23 (L) 08/10/2023   HGB 9.9 (L) 08/10/2023   HCT 30.0 (L) 08/10/2023   MCV 92.9 08/10/2023   MCH 30.7 08/10/2023   PLT 365 08/10/2023   MCHC 33.0 08/10/2023   RDW 13.2 08/10/2023   LYMPHSABS 0.7 08/07/2023   MONOABS 0.9 08/07/2023   EOSABS 0.0 08/07/2023   BASOSABS 0.0 08/07/2023     Last metabolic panel Lab Results  Component Value Date   NA 135 08/10/2023   K 4.4 08/10/2023   CL 101 08/10/2023   CO2 29 08/10/2023   BUN 12 08/10/2023   CREATININE 0.57 (L) 08/10/2023   GLUCOSE 113 (H) 08/10/2023   GFRNONAA >60 08/10/2023   GFRAA  10/23/2009    >60        The eGFR has been calculated using the MDRD equation. This calculation has not been validated in all clinical situations. eGFR's persistently <60 mL/min signify possible Chronic Kidney Disease.   CALCIUM 7.3 (L) 08/10/2023   PROT 4.6 (L) 08/10/2023   ALBUMIN 1.8 (L) 08/10/2023   BILITOT 0.9 08/10/2023   ALKPHOS 56 08/10/2023   AST 12 (L) 08/10/2023   ALT 16 08/10/2023   ANIONGAP 5 08/10/2023    CBG (last 3)  No results for input(s): "GLUCAP" in the last 72 hours.     Coagulation Profile: Recent Labs  Lab 08/09/23 1459  INR 1.1      Radiology Studies: I have personally reviewed the imaging studies  No results found.      Thad Ranger M.D. [facility] 08/10/2023, 2:15 PM  Available via Epic secure chat 7am-7pm After 7 pm, please  refer to night coverage provider listed on amion.

## 2023-08-10 NOTE — Progress Notes (Signed)
Patient has home CPAP in room but he has not been wearing it.

## 2023-08-10 NOTE — Sedation Documentation (Signed)
Per Dr. Loreta Ave, heparin drip to be restarted at 1800.

## 2023-08-10 NOTE — Sedation Documentation (Signed)
Patient returned to [address]. Alvira Philips RN at the bedside to receive patient handoff. Instructions to resume heparin in 4 hours and restart at 1800. Patient on telemetry and continuous monitoring.

## 2023-08-10 NOTE — Plan of Care (Signed)

## 2023-08-10 NOTE — Care Management Important Message (Signed)
Important Message  Patient Details  Name: Francisco Padilla MRN: [id number] Date of Birth: [birth year]   Important Message Given:  Yes - Medicare IM     Dorena Bodo 08/10/2023, 3:11 PM

## 2023-08-10 NOTE — Procedures (Addendum)
Interventional Radiology Procedure Note  Procedure:   Image guided drain into sup-hepatic abscess.  This connects on MRI with the fossa collection.  Image guided PTC and int/ext drainage.  31F into small bowel  Complications: None Specimen: culture from sub-hepatic abscess  Recommendations:  - Abdominal abscess drain to bulb suction.  BID flush with 5-10cc sterile saline and reattach to bulb. Record output - PTC int/ext biliary drain to gravity.  BID flush with 5-10cc sterile saline and reattach to gravity.  Record output - Do not submerge - Routine wound care  - ok to advance diet per primary - ok to restart hep ggt in 4 hours 6pm  Signed,  Yvone Neu. Loreta Ave, DO

## 2023-08-10 NOTE — Progress Notes (Signed)
PT Cancellation Note  Patient Details Name: JUSTUS KLINKO MRN: [id number] DOB: [birth year]   Cancelled Treatment:    Reason Eval/Treat Not Completed: Patient at procedure or test/unavailable. Patient currently off unit for IR to place perc drain and PTC drain. Will follow-up as schedule allows.   Cheri Guppy, PT, DPT Acute Rehab  [phone]   Richardson Chiquito 08/10/2023, 12:08 PM

## 2023-08-10 NOTE — Progress Notes (Signed)
Pt sleeping w/Home unit in use   08/09/23 2200  BiPAP/CPAP/SIPAP  BiPAP/CPAP/SIPAP Pt Type Adult  Patient Home Equipment Yes

## 2023-08-11 DIAGNOSIS — N179 Acute kidney failure, unspecified: Secondary | ICD-10-CM | POA: Diagnosis not present

## 2023-08-11 DIAGNOSIS — K810 Acute cholecystitis: Secondary | ICD-10-CM | POA: Diagnosis not present

## 2023-08-11 DIAGNOSIS — I350 Nonrheumatic aortic (valve) stenosis: Secondary | ICD-10-CM | POA: Diagnosis not present

## 2023-08-11 DIAGNOSIS — I4891 Unspecified atrial fibrillation: Secondary | ICD-10-CM | POA: Diagnosis not present

## 2023-08-11 LAB — COMPREHENSIVE METABOLIC PANEL
ALT: 18 U/L (ref 0–44)
AST: 17 U/L (ref 15–41)
Albumin: 2.2 g/dL — ABNORMAL LOW (ref 3.5–5.0)
Alkaline Phosphatase: 53 U/L (ref 38–126)
Anion gap: 4 — ABNORMAL LOW (ref 5–15)
BUN: 11 mg/dL (ref 8–23)
CO2: 29 mmol/L (ref 22–32)
Calcium: 8.3 mg/dL — ABNORMAL LOW (ref 8.9–10.3)
Chloride: 103 mmol/L (ref 98–111)
Creatinine, Ser: 0.71 mg/dL (ref 0.61–1.24)
GFR, Estimated: >60 mL/min (ref >60)
Glucose, Bld: 85 mg/dL (ref 70–99)
Potassium: 4.3 mmol/L (ref 3.5–5.1)
Sodium: 136 mmol/L (ref 135–145)
Total Bilirubin: 0.9 mg/dL (ref 0.0–1.2)
Total Protein: 4.9 g/dL — ABNORMAL LOW (ref 6.5–8.1)

## 2023-08-11 LAB — HEPARIN LEVEL (UNFRACTIONATED): Heparin Unfractionated: 0.29 [IU]/mL — ABNORMAL LOW (ref 0.30–0.70)

## 2023-08-11 LAB — CBC
HCT: 28.7 % — ABNORMAL LOW (ref 39.0–52.0)
Hemoglobin: 9.4 g/dL — ABNORMAL LOW (ref 13.0–17.0)
MCH: 30.6 pg (ref 26.0–34.0)
MCHC: 32.8 g/dL (ref 30.0–36.0)
MCV: 93.5 fL (ref 80.0–100.0)
Platelets: 337 10*3/uL (ref 150–400)
RBC: 3.07 MIL/uL — ABNORMAL LOW (ref 4.22–5.81)
RDW: 13.3 % (ref 11.5–15.5)
WBC: 11.2 10*3/uL — ABNORMAL HIGH (ref 4.0–10.5)
nRBC: 0 % (ref 0.0–0.2)

## 2023-08-11 LAB — FOLATE: Folate: 20.1 ng/mL (ref >5.9)

## 2023-08-11 LAB — VITAMIN B12: Vitamin B-12: 1155 pg/mL — ABNORMAL HIGH (ref 180–914)

## 2023-08-11 LAB — TSH: TSH: 1.697 u[IU]/mL (ref 0.350–4.500)

## 2023-08-11 LAB — CORTISOL: Cortisol, Plasma: 15 ug/dL

## 2023-08-11 MED ORDER — SODIUM CHLORIDE 0.9 % IV BOLUS
500.0000 mL | Freq: Once | INTRAVENOUS | Status: AC
  Administered 2023-08-11: 500 mL via INTRAVENOUS

## 2023-08-11 NOTE — Plan of Care (Signed)

## 2023-08-11 NOTE — Progress Notes (Signed)
Referring Physician(s): Dr. Thad Ranger  Supervising Physician: Roanna Banning  Patient Status:  [facility] - In-pt  Chief Complaint: Gangrenous cholecystitis  Subjective: Patient stable this AM.  VSS, BP improved.  All three RUQ drains in place with purulent/bloody output.   Allergies: Aspirin, Bupropion, Nsaids, and Oxycodone hcl  Medications: Prior to Admission medications   Medication Sig Start Date End Date Taking? Authorizing Provider  acetaminophen (TYLENOL) 650 MG CR tablet Take 1,300 mg by mouth every 8 (eight) hours.   Yes [provider]  alfuzosin (UROXATRAL) 10 MG 24 hr tablet Take 10 mg by mouth daily with breakfast.   Yes [provider]  amphetamine-dextroamphetamine (ADDERALL) 5 MG tablet Take 5 mg by mouth daily as needed (for ADD).   Yes [provider]  apixaban (ELIQUIS) 5 MG TABS tablet Take 5 mg by mouth 2 (two) times daily.   Yes [provider]  atorvastatin (LIPITOR) 20 MG tablet Take 20 mg by mouth at bedtime. 04/03/21  Yes [provider]  cetirizine (ZYRTEC) 10 MG tablet Take 1 tablet (10 mg total) by mouth daily. 06/14/23  Yes Ashok Croon, MD  Coenzyme Q10 (CO Q 10 PO) Take 1 capsule by mouth daily.   Yes [provider]  desvenlafaxine (PRISTIQ) 50 MG 24 hr tablet Take 50 mg by mouth daily. 02/05/23  Yes [provider]  fludrocortisone (FLORINEF) 0.1 MG tablet Take 0.1 mg by mouth daily. 03/07/19  Yes [provider]  fluticasone (FLONASE) 50 MCG/ACT nasal spray Place 2 sprays into both nostrils daily. 06/14/23  Yes Ashok Croon, MD  gabapentin (NEURONTIN) 300 MG capsule Take 2 capsules (600 mg total) by mouth at bedtime. 07/10/23 10/08/23 Yes Vivi Barrack, DPM  Glucosamine-Chondroitin (COSAMIN DS PO) Take 1,500 mg by mouth daily. Rotate with other Joint Health   Yes [provider]  latanoprost (XALATAN) 0.005 % ophthalmic solution Place 1 drop into both eyes at  bedtime. 04/23/21  Yes [provider]  LORazepam (ATIVAN) 1 MG tablet Take 1 mg by mouth every 4 (four) hours as needed.   Yes [provider]  Melatonin 10 MG TABS Take 1 tablet by mouth at bedtime.   Yes [provider]  Misc Natural Products (OSTEO BI-FLEX ADV TRIPLE ST PO) Take 1 tablet by mouth daily.   Yes [provider]  Multiple Vitamins-Minerals (ONE DAILY MULTIVITAMIN MEN) TABS Take 1 tablet by mouth 2 (two) times daily. CENTRUM SILVER 04/13/06  Yes [provider]  pantoprazole (PROTONIX) 40 MG tablet Take 40 mg by mouth in the morning and at bedtime. 10/24/09  Yes [provider]  Probiotic Product (PROBIOTIC PO) Take 1-2 capsules by mouth daily.   Yes [provider]  sildenafil (REVATIO) 20 MG tablet Take 20 mg by mouth daily as needed.   Yes [provider]  sodium chloride (OCEAN) 0.65 % SOLN nasal spray Place 1 spray into both nostrils as needed. 06/14/23  Yes Ashok Croon, MD  SUPER B COMPLEX/C PO Take 1 tablet by mouth daily.   Yes [provider]  zolpidem (AMBIEN) 10 MG tablet Take 5-10 mg by mouth at bedtime. 08/12/14  Yes [provider]  acetaminophen (TYLENOL) 325 MG tablet Take 2 tablets (650 mg total) by mouth every 4 (four) hours. 08/07/23   Arrien, York Ram, MD  acidophilus (RISAQUAD) CAPS capsule Take 1 capsule by mouth daily. 08/08/23   Arrien, York Ram, MD  calcium carbonate (TUMS - DOSED IN MG  ELEMENTAL CALCIUM) 500 MG chewable tablet Chew 1 tablet (200 mg of elemental calcium total) by mouth 3 (three) times daily. 08/07/23   Arrien, York Ram, MD  docusate sodium (COLACE) 100 MG capsule Take 1 capsule (100 mg total) by mouth 2 (two) times daily. 08/07/23   Arrien, York Ram, MD  feeding supplement (ENSURE ENLIVE / ENSURE PLUS) LIQD Take 237 mLs by mouth 2 (two) times daily between meals. 08/07/23   Arrien, York Ram, MD  latanoprost (XALATAN) 0.005 %  ophthalmic solution Place 1 drop into both eyes at bedtime. 08/07/23   Arrien, York Ram, MD  midodrine (PROAMATINE) 5 MG tablet Take 1 tablet (5 mg total) by mouth 3 (three) times daily with meals. 08/07/23   Arrien, York Ram, MD  piperacillin-tazobactam (ZOSYN) 3.375 GM/50ML IVPB Inject 50 mLs (3.375 g total) into the vein every 8 (eight) hours. 08/07/23   Arrien, York Ram, MD  polyethylene glycol (MIRALAX / GLYCOLAX) 17 g packet Take 17 g by mouth daily. 08/08/23   Arrien, York Ram, MD  senna-docusate (SENOKOT-S) 8.6-50 MG tablet Take 1 tablet by mouth at bedtime as needed for mild constipation. 08/07/23   Arrien, York Ram, MD  simethicone (MYLICON) 80 MG chewable tablet Chew 1 tablet (80 mg total) by mouth 4 (four) times daily as needed for flatulence. 08/07/23   Arrien, York Ram, MD  venlafaxine XR (EFFEXOR-XR) 37.5 MG 24 hr capsule Take 1 capsule (37.5 mg total) by mouth daily with breakfast. 08/08/23   Arrien, York Ram, MD     Vital Signs: BP 105/78 (BP Location: Left Arm)   Pulse 66   Temp 97.7 F (36.5 C) (Oral)   Resp 18   Ht 6\' 4"  (1.93 m)   Wt 210 lb 5.1 oz (95.4 kg)   SpO2 100%   BMI 25.60 kg/m   Physical Exam NAD, alert RUQ drain in place.  Insertion sites intact, clean, and dry.  BIlious output from biliary drain to gravity bag.  Thick, purulent, bloody output from biloma drain.   Imaging: IR US ABSCESS DRAIN PLACEMENT Result Date: 08/10/2023 INDICATION: 73 year old male with a history of biloma and bile leak after prior partial cholecystectomy. He presents for drainage and internal/external biliary drain. EXAM: ULTRASOUND-GUIDED DRAINAGE OF SUBHEPATIC ABSCESS IMAGE GUIDED PERCUTANEOUS TRANSHEPATIC CHOLANGIOGRAM WITH INTERNAL/EXTERNAL BILIARY DRAIN. MEDICATIONS: None ANESTHESIA/SEDATION: Moderate (conscious) sedation was employed during this procedure. A total of Versed 2.0 mg and Fentanyl 100 mcg, 1 mg Dilaudid, was administered  intravenously by the radiology nurse. Total intra-service moderate Sedation Time: 82 minutes. The patient's level of consciousness and vital signs were monitored continuously by radiology nursing throughout the procedure under my direct supervision. FLUOROSCOPY: Radiation Exposure Index (as provided by the fluoroscopic device): 387 mGy Kerma COMPLICATIONS: None PROCEDURE: The procedure, risks, benefits, and alternatives were explained to the patient and the patient's family. A complete informed consent was performed, with risk benefit analysis. Specific risks that were discussed for the procedure include bleeding, infection, biliary sepsis, IC use day, organ injury, need for further procedure, need for further surgery, long-term drain placement, cardiopulmonary collapse, death. Questions regarding the procedure were encouraged and answered. The patient understands and consents to the procedure. Patient is position in supine position on the fluoroscopy table, and the upper abdomen was prepped and draped in the usual sterile fashion. Maximum barrier sterile technique with sterile gowns and gloves were used for the procedure. A timeout was performed prior to the initiation of the procedure. Local anesthesia was provided with 1%  lidocaine with epinephrine. Ultrasound survey was performed of the hilum of the liver and the subhepatic liver in the right upper quadrant. Significant gas was present in the hilum of the liver, and differentiating what may be colon versus is a gas filled collection in this area was not possible. We elected to drain the sub a Paddock collection as this appears to communicate on the preoperative MRI. 1% lidocaine was used for local anesthesia. A trocar needle was advanced into the subhepatic fluid collection with ultrasound guidance. Modified Seldinger technique was used to place a 10 French pigtail catheter drain. Immediate return of similar fluid to the surgical drain. We then proceeded with  PTC. Ultrasound survey of the left liver lobe was performed, with then ultrasound of the right liver lobe. 1% lidocaine was used for local anesthesia, with generous infiltration of the skin and subcutaneous tissues in and inter left costal location. A Chiba needle was advanced under ultrasound guidance into the right liver lobe, using ultrasound guidance in attempt to access a biliary duct. We were successful in accessing portal vein, hepatic vein, and at 1 point distal hepatic artery. Given the small size of the biliary system/decompressed biliary system ultrasound was abandoned. We then proceeded with a formal PTC attempt. The access needle was advanced from a mid axillary line, eleventh rib into the a Paddock parenchyma. A traditional pullback contrast injection technique was used. It took 3 passes of the needle before distal biliary radicles were opacified. Intermittent opacification of the biliary system using this first needle position was possible in opacifying the more central biliary ducts. A second access was then used for access into the biliary system, from intercostal location. This second needle was placed into the more central biliary ducts using fluoroscopic guidance with multiple obliquity. Once the tip of this needle was confirmed within the more central biliary system, an 018 wire was advanced centrally. The needle was removed, a small incision was made with an 11 blade scalpel, and then a triaxial Accustick system was advanced into the biliary system. The metal stiffener and dilator were removed, we confirmed placement with contrast infusion. A coaxial Glidewire and 4 French glide cath were then used to navigate across the obstruction at the ampulla into the duodenum. Once the catheter was within the duodenum, the wire was removed and contrast confirmed location. A coon's wire was advanced through the system, and the Accustick and Glidewire were removed. Dilation of the subcutaneous tissue tracks  was performed with a 10 [place] dilator, then a 12 [place] dilator, and then a 68 [place] biliary drain was placed as an internal/external biliary drain. Small amount of contrast confirmed location. The patient tolerated the procedure well and remained hemodynamically stable throughout. No complications were encountered and no significant blood loss was encountered. IMPRESSION: Status post ultrasound-guided subhepatic abscess drainage, as well as image guided percutaneous transhepatic cholangiogram with internal/external drainage. Signed, Yvone Neu. Miachel Roux, RPVI [facility] Radiology Electronically Signed   By: Gilmer Mor D.O.   On: 08/10/2023 15:41   IR BILIARY DRAIN PLACEMENT WITH CHOLANGIOGRAM Result Date: 08/10/2023 INDICATION: 73 year old male with a history of biloma and bile leak after prior partial cholecystectomy. He presents for drainage and internal/external biliary drain. EXAM: ULTRASOUND-GUIDED DRAINAGE OF SUBHEPATIC ABSCESS IMAGE GUIDED PERCUTANEOUS TRANSHEPATIC CHOLANGIOGRAM WITH INTERNAL/EXTERNAL BILIARY DRAIN. MEDICATIONS: None ANESTHESIA/SEDATION: Moderate (conscious) sedation was employed during this procedure. A total of Versed 2.0 mg and Fentanyl 100 mcg, 1 mg Dilaudid, was administered intravenously by  the radiology nurse. Total intra-service moderate Sedation Time: 82 minutes. The patient's level of consciousness and vital signs were monitored continuously by radiology nursing throughout the procedure under my direct supervision. FLUOROSCOPY: Radiation Exposure Index (as provided by the fluoroscopic device): 387 mGy Kerma COMPLICATIONS: None PROCEDURE: The procedure, risks, benefits, and alternatives were explained to the patient and the patient's family. A complete informed consent was performed, with risk benefit analysis. Specific risks that were discussed for the procedure include bleeding, infection, biliary sepsis, IC use day,  organ injury, need for further procedure, need for further surgery, long-term drain placement, cardiopulmonary collapse, death. Questions regarding the procedure were encouraged and answered. The patient understands and consents to the procedure. Patient is position in supine position on the fluoroscopy table, and the upper abdomen was prepped and draped in the usual sterile fashion. Maximum barrier sterile technique with sterile gowns and gloves were used for the procedure. A timeout was performed prior to the initiation of the procedure. Local anesthesia was provided with 1% lidocaine with epinephrine. Ultrasound survey was performed of the hilum of the liver and the subhepatic liver in the right upper quadrant. Significant gas was present in the hilum of the liver, and differentiating what may be colon versus is a gas filled collection in this area was not possible. We elected to drain the sub a Paddock collection as this appears to communicate on the preoperative MRI. 1% lidocaine was used for local anesthesia. A trocar needle was advanced into the subhepatic fluid collection with ultrasound guidance. Modified Seldinger technique was used to place a 10 French pigtail catheter drain. Immediate return of similar fluid to the surgical drain. We then proceeded with PTC. Ultrasound survey of the left liver lobe was performed, with then ultrasound of the right liver lobe. 1% lidocaine was used for local anesthesia, with generous infiltration of the skin and subcutaneous tissues in and inter left costal location. A Chiba needle was advanced under ultrasound guidance into the right liver lobe, using ultrasound guidance in attempt to access a biliary duct. We were successful in accessing portal vein, hepatic vein, and at 1 point distal hepatic artery. Given the small size of the biliary system/decompressed biliary system ultrasound was abandoned. We then proceeded with a formal PTC attempt. The access needle was advanced  from a mid axillary line, eleventh rib into the a Paddock parenchyma. A traditional pullback contrast injection technique was used. It took 3 passes of the needle before distal biliary radicles were opacified. Intermittent opacification of the biliary system using this first needle position was possible in opacifying the more central biliary ducts. A second access was then used for access into the biliary system, from intercostal location. This second needle was placed into the more central biliary ducts using fluoroscopic guidance with multiple obliquity. Once the tip of this needle was confirmed within the more central biliary system, an 018 wire was advanced centrally. The needle was removed, a small incision was made with an 11 blade scalpel, and then a triaxial Accustick system was advanced into the biliary system. The metal stiffener and dilator were removed, we confirmed placement with contrast infusion. A coaxial Glidewire and 4 French glide cath were then used to navigate across the obstruction at the ampulla into the duodenum. Once the catheter was within the duodenum, the wire was removed and contrast confirmed location. A coon's wire was advanced through the system, and the Accustick and Glidewire were removed. Dilation of the subcutaneous tissue tracks was performed with  a 10 [place] dilator, then a 12 [place] dilator, and then a 47 [place] biliary drain was placed as an internal/external biliary drain. Small amount of contrast confirmed location. The patient tolerated the procedure well and remained hemodynamically stable throughout. No complications were encountered and no significant blood loss was encountered. IMPRESSION: Status post ultrasound-guided subhepatic abscess drainage, as well as image guided percutaneous transhepatic cholangiogram with internal/external drainage. Signed, Yvone Neu. Miachel Roux, RPVI [facility] Radiology Electronically  Signed   By: Gilmer Mor D.O.   On: 08/10/2023 15:41   MR ABDOMEN MRCP W WO CONTAST Result Date: 08/08/2023 CLINICAL DATA:  Bile leak EXAM: MRI ABDOMEN WITHOUT AND WITH CONTRAST (INCLUDING MRCP) TECHNIQUE: Multiplanar multisequence MR imaging of the abdomen was performed both before and after the administration of intravenous contrast. Heavily T2-weighted images of the biliary and pancreatic ducts were obtained, and three-dimensional MRCP images were rendered by post processing. CONTRAST:  10mL GADAVIST GADOBUTROL 1 MMOL/ML IV SOLN COMPARISON:  CT abdomen/pelvis dated 07/29/2023. FINDINGS: Motion degraded images. Lower chest: Small bilateral pleural effusions. Hepatobiliary: Liver is within normal limits. Status post subtotal cholecystectomy with residual gallbladder lumen and soft tissue debris in the surgical bed. Associated 9.2 x 6.1 x 10.8 cm fluid collection extending from the gallbladder fossa (series 6/image 31) to the inferior right perihepatic space (series 2/image 29). No intrahepatic or extrahepatic ductal dilatation. Common duct measures 5 mm, without discontinuity on MR. No choledocholithiasis is seen. Pancreas:  Within normal limits. Spleen:  Within normal limits. Adrenals/Urinary Tract:  Adrenal glands are within normal limits. Bilateral simple renal cysts, measuring up to 4.7 cm in the anterior left lower kidney (series 3/image 32), benign (Bosniak I). No follow-up is recommended. No hydronephrosis. Stomach/Bowel: Stomach is within normal limits. Visualized bowel is grossly unremarkable. Vascular/Lymphatic:  No evidence of aortic aneurysm. No suspicious abdominal lymphadenopathy. Other:  No abdominal ascites. Musculoskeletal: No focal osseous lesions. IMPRESSION: Status post subtotal cholecystectomy with residual gallbladder lumen and soft tissue debris in the surgical bed. Associated 10.8 cm fluid collection extending from the gallbladder fossa to the inferior right perihepatic space. No  intrahepatic or extrahepatic ductal dilatation. Common duct measures 5 mm, without discontinuity on MR. No choledocholithiasis is seen. Small bilateral pleural effusions. Electronically Signed   By: Charline Bills M.D.   On: 08/08/2023 03:10   MR 3D Recon At Scanner Result Date: 08/08/2023 CLINICAL DATA:  Bile leak EXAM: MRI ABDOMEN WITHOUT AND WITH CONTRAST (INCLUDING MRCP) TECHNIQUE: Multiplanar multisequence MR imaging of the abdomen was performed both before and after the administration of intravenous contrast. Heavily T2-weighted images of the biliary and pancreatic ducts were obtained, and three-dimensional MRCP images were rendered by post processing. CONTRAST:  10mL GADAVIST GADOBUTROL 1 MMOL/ML IV SOLN COMPARISON:  CT abdomen/pelvis dated 07/29/2023. FINDINGS: Motion degraded images. Lower chest: Small bilateral pleural effusions. Hepatobiliary: Liver is within normal limits. Status post subtotal cholecystectomy with residual gallbladder lumen and soft tissue debris in the surgical bed. Associated 9.2 x 6.1 x 10.8 cm fluid collection extending from the gallbladder fossa (series 6/image 31) to the inferior right perihepatic space (series 2/image 29). No intrahepatic or extrahepatic ductal dilatation. Common duct measures 5 mm, without discontinuity on MR. No choledocholithiasis is seen. Pancreas:  Within normal limits. Spleen:  Within normal limits. Adrenals/Urinary Tract:  Adrenal glands are within normal limits. Bilateral simple renal cysts, measuring up to 4.7 cm in the anterior left lower kidney (series 3/image 32), benign (Bosniak I). No follow-up  is recommended. No hydronephrosis. Stomach/Bowel: Stomach is within normal limits. Visualized bowel is grossly unremarkable. Vascular/Lymphatic:  No evidence of aortic aneurysm. No suspicious abdominal lymphadenopathy. Other:  No abdominal ascites. Musculoskeletal: No focal osseous lesions. IMPRESSION: Status post subtotal cholecystectomy with residual  gallbladder lumen and soft tissue debris in the surgical bed. Associated 10.8 cm fluid collection extending from the gallbladder fossa to the inferior right perihepatic space. No intrahepatic or extrahepatic ductal dilatation. Common duct measures 5 mm, without discontinuity on MR. No choledocholithiasis is seen. Small bilateral pleural effusions. Electronically Signed   By: Charline Bills M.D.   On: 08/08/2023 03:10    Labs:  CBC: Recent Labs    08/08/23 0629 08/09/23 0508 08/10/23 0430 08/11/23 0500  WBC 15.5* 13.5* 18.0* 11.2*  HGB 10.5* 10.0* 9.9* 9.4*  HCT 31.8* 29.7* 30.0* 28.7*  PLT 295 295 365 337    COAGS: Recent Labs    11/22/22 0955 12/07/22 1548 08/01/23 0303 08/01/23 2009 08/02/23 1955 08/03/23 0845 08/07/23 0930 08/08/23 0944 08/08/23 1622 08/09/23 1459  INR 1.2 1.1  --  1.3*  --   --   --   --   --  1.1  APTT  --   --    < > 40*   < > 39* 31 70* 96*  --    < > = values in this interval not displayed.    BMP: Recent Labs    08/08/23 0630 08/09/23 0630 08/10/23 0430 08/11/23 0500  NA 137 137 135 136  K 3.6 3.4* 4.4 4.3  CL 100 100 101 103  CO2 30 29 29 29   GLUCOSE 93 97 113* 85  BUN 17 14 12 11   CALCIUM 8.2* 8.1* 7.3* 8.3*  CREATININE 0.59* 0.61 0.57* 0.71  GFRNONAA >60 >60 >60 >60    LIVER FUNCTION TESTS: Recent Labs    08/08/23 0630 08/09/23 0630 08/10/23 0430 08/11/23 0500  BILITOT 0.7 0.7 0.9 0.9  AST 15 16 12* 17  ALT 19 19 16 18   ALKPHOS 51 55 56 53  PROT 4.5* 4.5* 4.6* 4.9*  ALBUMIN 1.8* 1.7* 1.8* 2.2*    Assessment and Plan: Gangrenous cholecystitis s/p subtotal cholecystectomy with biloma/bile leak now s/p biliary drain and RUQ biloma drain placement by Dr. Loreta Ave 08/09/22  Drain Location: RUQ, biliary drain Size: Fr size: 12 Fr Date of placement: 08/09/22  Currently to: Drain collection device: gravity  Drain Location: RUQ, subhepatic/biloma Size: Fr size: 10 Fr Date of placement: 08/09/22  Currently to: Drain  collection device: suction bulb 24 hour output:  Output by Drain (mL) 08/09/23 0701 - 08/09/23 1900 08/09/23 1901 - 08/10/23 0700 08/10/23 0701 - 08/10/23 1900 08/10/23 1901 - 08/11/23 0700 08/11/23 0701 - 08/11/23 1036  Closed System Drain 1 Right;Lateral RLQ Bulb (JP) 19 Fr. 20 360 130  10  Closed System Drain 1 Right;Anterior Abdomen Bulb (JP) 10.2 Fr.   110 5 10  Biliary Tube Cook slip-coat 12 Fr. RUQ   350 475 180    Interval imaging/drain manipulation:  None  Current examination: Flushes/aspirates easily.  Insertion site unremarkable. Suture and stat lock in place. Dressed appropriately.   Plan: Continue TID flushes with 5 cc NS. Record output Q shift. Dressing changes QD or PRN if soiled.    Discharge planning: Please contact IR APP or on call IR MD prior to patient d/c to ensure appropriate follow up plans are in place.  IR will continue to follow - please call with questions or concerns.  Electronically Signed: Hoyt Koch, PA 08/11/2023, 10:29 AM   I spent a total of 15 Minutes at the the patient's bedside AND on the patient's hospital floor or unit, greater than 50% of which was counseling/coordinating care for bile leak.

## 2023-08-11 NOTE — Progress Notes (Signed)
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   Oxycodone Hcl     Hallucinations, agitation     Patient Measurements: Height: 6\' 4"  (193 cm) Weight: 95.4 kg (210 lb 5.1 oz) IBW/kg (Calculated) : 86.8 Heparin Dosing Weight: 93 kg  Vital Signs: Temp: 97.7 F (36.5 C) (01/18 0735) Temp Source: Oral (01/18 0735) BP: 101/73 (01/18 1100) Pulse Rate: 66 (01/18 0735)  Labs: Recent Labs    08/08/23 1622 08/09/23 0508 08/09/23 0508 08/09/23 0630 08/09/23 1459 08/10/23 0430 08/11/23 0500 08/11/23 1320  HGB  --  10.0*   < >  --   --  9.9* 9.4*  --   HCT  --  29.7*  --   --   --  30.0* 28.7*  --   PLT  --  295  --   --   --  365 337  --   APTT 96*  --   --   --   --   --   --   --   LABPROT  --   --   --   --  14.9  --   --   --   INR  --   --   --   --  1.1  --   --   --   HEPARINUNFRC 0.40 0.43  --   --   --   --   --  0.29*  CREATININE  --   --   --  0.61  --  0.57* 0.71  --    < > = values in this interval not displayed.    Estimated Creatinine Clearance: 104 mL/min (by C-G formula based on SCr of 0.71 mg/dL).  Assessment: 73 y.o. male . Patine wason Eliquis pta for PAF. Last dose given 1/3. Transitioned to IV Heparin which was stopped 1/8 AM for cholecystectomy. Held throughout post-operative day 0 (1/9) due to bleeding loss during surgery. Pharmacy consulted to resume IV Heparin  on 08/03/23 AM - no bolus. Pt continues on heparin s/p cholecystectomy 08/01/23. Patient transitioned back to apixaban 1/14 but patient with increased abdominal pain. Switched back to heparin for possible procedure. Apixaban last given 1/14 0818.   Heparin held at 1/17 04:00 for IR procedure. Heparin level 0.43 was therapeutic on 2700  units/hr.  Heparin restarted at 1745 at 2700 units/hr.  1/18: 1300 heparin level 0.29, subtherapeutic with heparin infusion at 2700 units/hour. Hgb 9.4 low stable, Plt 337 WNL. No signs/symptoms of bleeding. Access lost overnight but should not have affected the level.  Goal of Therapy:  Heparin level 0.3-0.7 units/mL aPTT 66-102 seconds Monitor platelets by anticoagulation protocol: Yes   Plan:  Increase heparin to 2800 units/h Check 8 hour heparin level Monitor daily heparin level, CBC, signs/symptoms of bleeding  F/u restart apix when no procedures  Thank you for allowing pharmacy to be part of this patients care team.  Stephenie Acres, PharmD PGY1 Pharmacy Resident 08/11/2023 1:52 PM   Please check AMION for all [facility] Pharmacy phone numbers After 10:00 PM, call Main Pharmacy [phone]

## 2023-08-11 NOTE — Progress Notes (Addendum)
10 Days Post-Op  Subjective: Still with some pain around incisions and drains, but internal pain is improved today after IR procedures yesterday.  Getting ready to get breakfast.  No nausea.  BP better this morning, but appears to be getting a bit of albumin.  Objective: Vital signs in last 24 hours: Temp:  [97.7 F (36.5 C)-98 F (36.7 C)] 97.7 F (36.5 C) (01/18 0735) Pulse Rate:  [66-100] 66 (01/18 0735) Resp:  [9-22] 18 (01/18 0735) BP: (87-116)/(53-81) 105/78 (01/18 0735) SpO2:  [99 %-100 %] 100 % (01/18 0735) Weight:  [95.4 kg] 95.4 kg (01/18 0619) Last BM Date : 08/08/23  Intake/Output from previous day: 01/17 0701 - 01/18 0700 In: 653.6 [P.O.:120; I.V.:334.5; IV Piggyback:199] Out: 1565 [Urine:850; Drains:715] Intake/Output this shift: Total I/O In: 27.1 [I.V.:27.1] Out: 200 [Drains:200]  PE: Gen: NAD Abd: soft, 3 drains present.  Surgical drain with tan purulent output - 110cc, new IR drain with bloody purulent output, 130cc, PTC with 475cc of thin bilious output documented.  Bag is about half full currently.  Lab Results:  Recent Labs    08/10/23 0430 08/11/23 0500  WBC 18.0* 11.2*  HGB 9.9* 9.4*  HCT 30.0* 28.7*  PLT 365 337   BMET Recent Labs    08/10/23 0430 08/11/23 0500  NA 135 136  K 4.4 4.3  CL 101 103  CO2 29 29  GLUCOSE 113* 85  BUN 12 11  CREATININE 0.57* 0.71  CALCIUM 7.3* 8.3*   PT/INR Recent Labs    08/09/23 1459  LABPROT 14.9  INR 1.1   CMP     Component Value Date/Time   NA 136 08/11/2023 0500   K 4.3 08/11/2023 0500   CL 103 08/11/2023 0500   CO2 29 08/11/2023 0500   GLUCOSE 85 08/11/2023 0500   BUN 11 08/11/2023 0500   CREATININE 0.71 08/11/2023 0500   CREATININE 0.77 07/19/2023 1128   CALCIUM 8.3 (L) 08/11/2023 0500   PROT 4.9 (L) 08/11/2023 0500   ALBUMIN 2.2 (L) 08/11/2023 0500   AST 17 08/11/2023 0500   ALT 18 08/11/2023 0500   ALKPHOS 53 08/11/2023 0500   BILITOT 0.9 08/11/2023 0500   GFRNONAA >60  08/11/2023 0500   GFRAA  10/23/2009 0135    >60        The eGFR has been calculated using the MDRD equation. This calculation has not been validated in all clinical situations. eGFR's persistently <60 mL/min signify possible Chronic Kidney Disease.   Lipase     Component Value Date/Time   LIPASE 29 07/29/2023 0014       Studies/Results: IR US ABSCESS DRAIN PLACEMENT Result Date: 08/10/2023 INDICATION: 73 year old male with a history of biloma and bile leak after prior partial cholecystectomy. He presents for drainage and internal/external biliary drain. EXAM: ULTRASOUND-GUIDED DRAINAGE OF SUBHEPATIC ABSCESS IMAGE GUIDED PERCUTANEOUS TRANSHEPATIC CHOLANGIOGRAM WITH INTERNAL/EXTERNAL BILIARY DRAIN. MEDICATIONS: None ANESTHESIA/SEDATION: Moderate (conscious) sedation was employed during this procedure. A total of Versed 2.0 mg and Fentanyl 100 mcg, 1 mg Dilaudid, was administered intravenously by the radiology nurse. Total intra-service moderate Sedation Time: 82 minutes. The patient's level of consciousness and vital signs were monitored continuously by radiology nursing throughout the procedure under my direct supervision. FLUOROSCOPY: Radiation Exposure Index (as provided by the fluoroscopic device): 387 mGy Kerma COMPLICATIONS: None PROCEDURE: The procedure, risks, benefits, and alternatives were explained to the patient and the patient's family. A complete informed consent was performed, with risk benefit analysis. Specific risks that were  discussed for the procedure include bleeding, infection, biliary sepsis, IC use day, organ injury, need for further procedure, need for further surgery, long-term drain placement, cardiopulmonary collapse, death. Questions regarding the procedure were encouraged and answered. The patient understands and consents to the procedure. Patient is position in supine position on the fluoroscopy table, and the upper abdomen was prepped and draped in the usual  sterile fashion. Maximum barrier sterile technique with sterile gowns and gloves were used for the procedure. A timeout was performed prior to the initiation of the procedure. Local anesthesia was provided with 1% lidocaine with epinephrine. Ultrasound survey was performed of the hilum of the liver and the subhepatic liver in the right upper quadrant. Significant gas was present in the hilum of the liver, and differentiating what may be colon versus is a gas filled collection in this area was not possible. We elected to drain the sub a Paddock collection as this appears to communicate on the preoperative MRI. 1% lidocaine was used for local anesthesia. A trocar needle was advanced into the subhepatic fluid collection with ultrasound guidance. Modified Seldinger technique was used to place a 10 French pigtail catheter drain. Immediate return of similar fluid to the surgical drain. We then proceeded with PTC. Ultrasound survey of the left liver lobe was performed, with then ultrasound of the right liver lobe. 1% lidocaine was used for local anesthesia, with generous infiltration of the skin and subcutaneous tissues in and inter left costal location. A Chiba needle was advanced under ultrasound guidance into the right liver lobe, using ultrasound guidance in attempt to access a biliary duct. We were successful in accessing portal vein, hepatic vein, and at 1 point distal hepatic artery. Given the small size of the biliary system/decompressed biliary system ultrasound was abandoned. We then proceeded with a formal PTC attempt. The access needle was advanced from a mid axillary line, eleventh rib into the a Paddock parenchyma. A traditional pullback contrast injection technique was used. It took 3 passes of the needle before distal biliary radicles were opacified. Intermittent opacification of the biliary system using this first needle position was possible in opacifying the more central biliary ducts. A second access  was then used for access into the biliary system, from intercostal location. This second needle was placed into the more central biliary ducts using fluoroscopic guidance with multiple obliquity. Once the tip of this needle was confirmed within the more central biliary system, an 018 wire was advanced centrally. The needle was removed, a small incision was made with an 11 blade scalpel, and then a triaxial Accustick system was advanced into the biliary system. The metal stiffener and dilator were removed, we confirmed placement with contrast infusion. A coaxial Glidewire and 4 French glide cath were then used to navigate across the obstruction at the ampulla into the duodenum. Once the catheter was within the duodenum, the wire was removed and contrast confirmed location. A coon's wire was advanced through the system, and the Accustick and Glidewire were removed. Dilation of the subcutaneous tissue tracks was performed with a 10 [place] dilator, then a 12 [place] dilator, and then a 55 [place] biliary drain was placed as an internal/external biliary drain. Small amount of contrast confirmed location. The patient tolerated the procedure well and remained hemodynamically stable throughout. No complications were encountered and no significant blood loss was encountered. IMPRESSION: Status post ultrasound-guided subhepatic abscess drainage, as well as image guided percutaneous transhepatic cholangiogram with internal/external drainage. Signed, Yvone Neu. Loreta Ave, DO, ABVM, RPVI  [facility] Radiology Electronically Signed   By: Gilmer Mor D.O.   On: 08/10/2023 15:41   IR BILIARY DRAIN PLACEMENT WITH CHOLANGIOGRAM Result Date: 08/10/2023 INDICATION: 73 year old male with a history of biloma and bile leak after prior partial cholecystectomy. He presents for drainage and internal/external biliary drain. EXAM: ULTRASOUND-GUIDED DRAINAGE OF SUBHEPATIC ABSCESS IMAGE GUIDED  PERCUTANEOUS TRANSHEPATIC CHOLANGIOGRAM WITH INTERNAL/EXTERNAL BILIARY DRAIN. MEDICATIONS: None ANESTHESIA/SEDATION: Moderate (conscious) sedation was employed during this procedure. A total of Versed 2.0 mg and Fentanyl 100 mcg, 1 mg Dilaudid, was administered intravenously by the radiology nurse. Total intra-service moderate Sedation Time: 82 minutes. The patient's level of consciousness and vital signs were monitored continuously by radiology nursing throughout the procedure under my direct supervision. FLUOROSCOPY: Radiation Exposure Index (as provided by the fluoroscopic device): 387 mGy Kerma COMPLICATIONS: None PROCEDURE: The procedure, risks, benefits, and alternatives were explained to the patient and the patient's family. A complete informed consent was performed, with risk benefit analysis. Specific risks that were discussed for the procedure include bleeding, infection, biliary sepsis, IC use day, organ injury, need for further procedure, need for further surgery, long-term drain placement, cardiopulmonary collapse, death. Questions regarding the procedure were encouraged and answered. The patient understands and consents to the procedure. Patient is position in supine position on the fluoroscopy table, and the upper abdomen was prepped and draped in the usual sterile fashion. Maximum barrier sterile technique with sterile gowns and gloves were used for the procedure. A timeout was performed prior to the initiation of the procedure. Local anesthesia was provided with 1% lidocaine with epinephrine. Ultrasound survey was performed of the hilum of the liver and the subhepatic liver in the right upper quadrant. Significant gas was present in the hilum of the liver, and differentiating what may be colon versus is a gas filled collection in this area was not possible. We elected to drain the sub a Paddock collection as this appears to communicate on the preoperative MRI. 1% lidocaine was used for local  anesthesia. A trocar needle was advanced into the subhepatic fluid collection with ultrasound guidance. Modified Seldinger technique was used to place a 10 French pigtail catheter drain. Immediate return of similar fluid to the surgical drain. We then proceeded with PTC. Ultrasound survey of the left liver lobe was performed, with then ultrasound of the right liver lobe. 1% lidocaine was used for local anesthesia, with generous infiltration of the skin and subcutaneous tissues in and inter left costal location. A Chiba needle was advanced under ultrasound guidance into the right liver lobe, using ultrasound guidance in attempt to access a biliary duct. We were successful in accessing portal vein, hepatic vein, and at 1 point distal hepatic artery. Given the small size of the biliary system/decompressed biliary system ultrasound was abandoned. We then proceeded with a formal PTC attempt. The access needle was advanced from a mid axillary line, eleventh rib into the a Paddock parenchyma. A traditional pullback contrast injection technique was used. It took 3 passes of the needle before distal biliary radicles were opacified. Intermittent opacification of the biliary system using this first needle position was possible in opacifying the more central biliary ducts. A second access was then used for access into the biliary system, from intercostal location. This second needle was placed into the more central biliary ducts using fluoroscopic guidance with multiple obliquity. Once the tip of this needle was confirmed within the more central biliary system, an 018 wire was advanced centrally. The needle  was removed, a small incision was made with an 11 blade scalpel, and then a triaxial Accustick system was advanced into the biliary system. The metal stiffener and dilator were removed, we confirmed placement with contrast infusion. A coaxial Glidewire and 4 French glide cath were then used to navigate across the obstruction  at the ampulla into the duodenum. Once the catheter was within the duodenum, the wire was removed and contrast confirmed location. A coon's wire was advanced through the system, and the Accustick and Glidewire were removed. Dilation of the subcutaneous tissue tracks was performed with a 10 [place] dilator, then a 12 [place] dilator, and then a 30 [place] biliary drain was placed as an internal/external biliary drain. Small amount of contrast confirmed location. The patient tolerated the procedure well and remained hemodynamically stable throughout. No complications were encountered and no significant blood loss was encountered. IMPRESSION: Status post ultrasound-guided subhepatic abscess drainage, as well as image guided percutaneous transhepatic cholangiogram with internal/external drainage. Signed, Yvone Neu. Miachel Roux, RPVI [facility] Radiology Electronically Signed   By: Gilmer Mor D.O.   On: 08/10/2023 15:41      Anti-infectives: Anti-infectives (From admission, onward)    Start     Dose/Rate Route Frequency Ordered Stop   08/09/23 1400  cefTRIAXone (ROCEPHIN) 2 g in sodium chloride 0.9 % 100 mL IVPB  Status:  Discontinued        2 g 200 mL/hr over 30 Minutes Intravenous To Radiology 08/09/23 1254 08/10/23 0716   08/09/23 1245  cefTRIAXone (ROCEPHIN) 2 g in sodium chloride 0.9 % 100 mL IVPB  Status:  Discontinued       Note to Pharmacy: Please do not give on floor. To be given in IR.   2 g 200 mL/hr over 30 Minutes Intravenous To Radiology 08/09/23 1152 08/09/23 1213   08/07/23 1100  piperacillin-tazobactam (ZOSYN) IVPB 3.375 g        3.375 g 12.5 mL/hr over 240 Minutes Intravenous Every 8 hours 08/07/23 1008     08/07/23 0000  piperacillin-tazobactam (ZOSYN) 3.375 GM/50ML IVPB        3.375 g Intravenous Every 8 hours 08/07/23 1501     07/29/23 0800  piperacillin-tazobactam (ZOSYN) IVPB 3.375 g        3.375 g 12.5 mL/hr over 240  Minutes Intravenous Every 8 hours 07/29/23 0207 08/06/23 2359   07/29/23 0030  piperacillin-tazobactam (ZOSYN) IVPB 3.375 g        3.375 g 100 mL/hr over 30 Minutes Intravenous  Once 07/29/23 0020 07/29/23 0147        Assessment/Plan POD 10, s/p subtotal cholecystectomy for gangrenous cholecystitis, Dr. Hillery Hunter 1/8 -regular diet -cont abx therapy.  WBC down to 11K today -LFTs normal -drain output  stable from surgical drain.  No bile noted today.   - new IR perc drain and PTC drain to help manage bile leak given inability to perform ERCP due to bypass anatomy. -will monitor drains this weekend and likely if all remains stable, will see if IR can internalize PTC early this coming week.   - I contacted [name], [facility], and [facility] yesterday on behalf of the patient and his request to transfer as well as need for possible transfer due to ERCP difficulties in the setting of bypass anatomy. -[name] declined due to capacity.  [facility] was reviewing his case and the patient then refused this location so request for transfer was withdrawn.  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  They also have no beds and unclear at what point that may change to even accept the patient.  Their recommendation was to have IR place a PTC drain so this stents his CBD and essentially accomplishes the same goal in a safe technique.   FEN -regular VTE - hold eliquis, heparin gtt ID - Zosyn, cont at this time given WBC still elevated and murky bilious leak  A fib AKI Peripheral neuropathy GERD HLD Hx of gastric bypass   LOS: 13 days    Letha Cape , [facility] Surgery 08/11/2023, 8:17 AM Please see Amion for pager number during day hours 7:00am-4:30pm or 7:00am -11:30am on weekends

## 2023-08-11 NOTE — Progress Notes (Signed)
[facility]                                                                              Francisco Padilla, is a 73 y.o. male, DOB - 1951/01/11, UJW:[id number] Admit date - 07/28/2023    Outpatient Primary MD for the patient is [name], MD  LOS - 13  days  Chief Complaint  Patient presents with   Weakness       Brief summary   Patient is a 73 year old male with pertinent PMH OSA on CPAP, HLD, chronic hypotension, PAF on Eliquis, PVD presents to [facility] ED on 1/5 with cholecystitis.   Patient complaining of weakness/fatigue going on for about a month.  Patient also having nausea, abdominal pain, diarrhea.  Abdominal pain worse in right upper quadrant.  Denies fever/chills.  Came to [facility] ED on 1/5 for further workup.  On arrival patient noted to be slightly tachycardic 100s and mildly hypotensive 93/73.  CT ABD/pelvis showing acute cholecystitis.  Surgery consulted.  Given IV fluids and Zosyn.  Awaiting surgery 48 hours for Eliquis washout. Cards consulted for preoperative evaluation of cholecystectomy. Echo on 1/7 showing lvef 60-65%; severe aortic stenosis.  On 1/8 patient taken to the OR for cholecystectomy.  While in the OR patient had tachycardia 150s and started on esmolol.  Patient later became hypotensive and started on neo.  Postop patient taken to ICU while on pressors.  PCCM consulted.   1/5 admitted with cholecystitis 1/8 s/p cholecystectomy; given esmolol for HR 150s then became hypotensive on neo-; postop taken to ICU and PCCM consulted 1/10 episode of rapid A-fib with RVR in the 150s, systolic pressure in the 70s. 56/21: no clinical signs of bile leak, and no need for ERCP.  01/14: worsening drain out put, suggesting resumption of bile leak. Plan for transfer to tertiary care.   1/17: Image guided drain into subhepatic abscess, PTC   Assessment & Plan    Principal Problem:   Acute cholecystitis Gangrenous cholecystitis, complicated with sepsis status  post subtotal cholecystectomy by Dr. Hillery Hunter on 1/8  -Postop ICU care due to tachycardia requiring esmolol and subsequent hypotension/shock requiring vasopressors, A-fib with RVR. -Completed antibiotic management with Zosyn -MRCP: undrained fluid collection that the surgical drain traverses but unable to completely evacuate -IR following, s/p image guided drain into the subhepatic abscess and PTC on 1/17 (Dr. Loreta Ave) -Follow cultures, prelim cultures showing gram-negative rods, continue IV Zosyn -Feeling a lot better    Chronic hypotension -Initially placed on Solu-Cortef, stress dose steroids have been discontinued -Continue Florinef 0.1 mg daily, increase midodrine to 10 mg 3 times daily -B12, folate normal, a.m. cortisol also normal 15.0 - Albumin 1.7 on 1/17, ordered IV albumin x 3   Hypokalemia -Replace as needed    AKI (acute kidney injury) (HCC) -Creatinine stable  Hyponatremia -Resolved      Aortic stenosis, severe Stable, no signs of acute decompensation    Atrial fibrillation with RVR (HCC) -Rate controlled -Continue midodrine for BP -Heparin resumed   History of Roux-en-Y gastric bypass Follow up as outpatient.    OSA on CPAP Continue Home Cpap.    Pressure  injury documentation Right buttocks, stage I, Not present on admission -Nursing care     Severe calorie malnutrition Nutrition Problem: Severe Malnutrition Etiology: chronic illness (gastric bypass) Signs/Symptoms: severe fat depletion, severe muscle depletion Interventions: Ensure Enlive (each supplement provides 350kcal and 20 grams of protein), MVI, Carnation Instant Breakfast  Estimated body mass index is 25.6 kg/m as calculated from the following:   Height as of this encounter: 6\' 4"  (1.93 m).   Weight as of this encounter: 95.4 kg.  Code Status: Full code DVT Prophylaxis:  SCDs Start: 07/29/23 0131 Place TED hose Start: 07/29/23 0131   Level of Care: Level of care: Telemetry  Cardiac Family Communication: Updated patient' Disposition Plan:      Remains inpatient appropriate:    Procedures:  08/01/2023 subtotal cholecystectomy (Dr Melody Haver, general surgery) 1/17: Image guided drain into subhepatic abscess, PTC  Consultants:   Cardiology General Surgery Critical care  Antimicrobials:   Anti-infectives (From admission, onward)    Start     Dose/Rate Route Frequency Ordered Stop   08/09/23 1400  cefTRIAXone (ROCEPHIN) 2 g in sodium chloride 0.9 % 100 mL IVPB  Status:  Discontinued        2 g 200 mL/hr over 30 Minutes Intravenous To Radiology 08/09/23 1254 08/10/23 0716   08/09/23 1245  cefTRIAXone (ROCEPHIN) 2 g in sodium chloride 0.9 % 100 mL IVPB  Status:  Discontinued       Note to Pharmacy: Please do not give on floor. To be given in IR.   2 g 200 mL/hr over 30 Minutes Intravenous To Radiology 08/09/23 1152 08/09/23 1213   08/07/23 1100  piperacillin-tazobactam (ZOSYN) IVPB 3.375 g        3.375 g 12.5 mL/hr over 240 Minutes Intravenous Every 8 hours 08/07/23 1008     08/07/23 0000  piperacillin-tazobactam (ZOSYN) 3.375 GM/50ML IVPB        3.375 g Intravenous Every 8 hours 08/07/23 1501     07/29/23 0800  piperacillin-tazobactam (ZOSYN) IVPB 3.375 g        3.375 g 12.5 mL/hr over 240 Minutes Intravenous Every 8 hours 07/29/23 0207 08/06/23 2359   07/29/23 0030  piperacillin-tazobactam (ZOSYN) IVPB 3.375 g        3.375 g 100 mL/hr over 30 Minutes Intravenous  Once 07/29/23 0020 07/29/23 0147          Medications  acetaminophen  650 mg Oral Q4H   Or   acetaminophen  650 mg Rectal Q4H   acidophilus  1 capsule Oral Daily   alfuzosin  10 mg Oral Q breakfast   calcium carbonate  200 mg of elemental calcium Oral TID   Chlorhexidine Gluconate Cloth  6 each Topical Daily   docusate sodium  100 mg Oral BID   feeding supplement  237 mL Oral BID BM   fludrocortisone  0.1 mg Oral Daily   gabapentin  600 mg Oral QHS   latanoprost  1 drop Both  Eyes QHS   midodrine  10 mg Oral TID WC   multivitamin with minerals  1 tablet Oral BID   nutrition supplement (JUVEN)  1 packet Oral BID BM   pantoprazole  40 mg Oral Daily   polyethylene glycol  17 g Oral Daily   sodium chloride flush  3 mL Intravenous Q12H   sodium chloride flush  5 mL Intracatheter Q8H   venlafaxine XR  37.5 mg Oral Q breakfast      Subjective:   Francisco Padilla was seen  and examined today.  Feeling a lot better today, states slept well last night, was getting ready to eat breakfast at the time of my encounter.  No acute complaints.  No chest pain, shortness of breath, dizziness, nausea or vomiting.  Objective:   Vitals:   08/10/23 2239 08/11/23 0619 08/11/23 0735 08/11/23 1100  BP:  102/76 105/78 101/73  Pulse:  76 66   Resp: 18 16 18 14   Temp:  97.8 F (36.6 C) 97.7 F (36.5 C)   TempSrc:  Oral Oral   SpO2:  100% 100%   Weight:  95.4 kg    Height:        Intake/Output Summary (Last 24 hours) at 08/11/2023 1253 Last data filed at 08/11/2023 0900 Gross per 24 hour  Intake 815.62 ml  Output 1990 ml  Net -1174.38 ml     Wt Readings from Last 3 Encounters:  08/11/23 95.4 kg  07/19/23 92.4 kg  06/14/23 96.9 kg   Physical Exam General: Alert and oriented x 3, NAD Cardiovascular: S1 S2 clear, RRR.  Respiratory: CTAB, no wheezing, rales or rhonchi Gastrointestinal: Soft, 3 drains, surgical drain with purulent output, new IR drain with purulent output, PTC Ext: no pedal edema bilaterally Neuro: no new deficits Psych: Normal affect      Data Reviewed:  I have personally reviewed following labs    CBC Lab Results  Component Value Date   WBC 11.2 (H) 08/11/2023   RBC 3.07 (L) 08/11/2023   HGB 9.4 (L) 08/11/2023   HCT 28.7 (L) 08/11/2023   MCV 93.5 08/11/2023   MCH 30.6 08/11/2023   PLT 337 08/11/2023   MCHC 32.8 08/11/2023   RDW 13.3 08/11/2023   LYMPHSABS 0.7 08/07/2023   MONOABS 0.9 08/07/2023   EOSABS 0.0 08/07/2023   BASOSABS 0.0  08/07/2023     Last metabolic panel Lab Results  Component Value Date   NA 136 08/11/2023   K 4.3 08/11/2023   CL 103 08/11/2023   CO2 29 08/11/2023   BUN 11 08/11/2023   CREATININE 0.71 08/11/2023   GLUCOSE 85 08/11/2023   GFRNONAA >60 08/11/2023   GFRAA  10/23/2009    >60        The eGFR has been calculated using the MDRD equation. This calculation has not been validated in all clinical situations. eGFR's persistently <60 mL/min signify possible Chronic Kidney Disease.   CALCIUM 8.3 (L) 08/11/2023   PROT 4.9 (L) 08/11/2023   ALBUMIN 2.2 (L) 08/11/2023   BILITOT 0.9 08/11/2023   ALKPHOS 53 08/11/2023   AST 17 08/11/2023   ALT 18 08/11/2023   ANIONGAP 4 (L) 08/11/2023    CBG (last 3)  No results for input(s): "GLUCAP" in the last 72 hours.     Coagulation Profile: Recent Labs  Lab 08/09/23 1459  INR 1.1      Radiology Studies: I have personally reviewed the imaging studies  IR US ABSCESS DRAIN PLACEMENT Result Date: 08/10/2023 INDICATION: 73 year old male with a history of biloma and bile leak after prior partial cholecystectomy. He presents for drainage and internal/external biliary drain. EXAM: ULTRASOUND-GUIDED DRAINAGE OF SUBHEPATIC ABSCESS IMAGE GUIDED PERCUTANEOUS TRANSHEPATIC CHOLANGIOGRAM WITH INTERNAL/EXTERNAL BILIARY DRAIN. MEDICATIONS: None ANESTHESIA/SEDATION: Moderate (conscious) sedation was employed during this procedure. A total of Versed 2.0 mg and Fentanyl 100 mcg, 1 mg Dilaudid, was administered intravenously by the radiology nurse. Total intra-service moderate Sedation Time: 82 minutes. The patient's level of consciousness and vital signs were monitored continuously by radiology nursing throughout the  procedure under my direct supervision. FLUOROSCOPY: Radiation Exposure Index (as provided by the fluoroscopic device): 387 mGy Kerma COMPLICATIONS: None PROCEDURE: The procedure, risks, benefits, and alternatives were explained to the patient and  the patient's family. A complete informed consent was performed, with risk benefit analysis. Specific risks that were discussed for the procedure include bleeding, infection, biliary sepsis, IC use day, organ injury, need for further procedure, need for further surgery, long-term drain placement, cardiopulmonary collapse, death. Questions regarding the procedure were encouraged and answered. The patient understands and consents to the procedure. Patient is position in supine position on the fluoroscopy table, and the upper abdomen was prepped and draped in the usual sterile fashion. Maximum barrier sterile technique with sterile gowns and gloves were used for the procedure. A timeout was performed prior to the initiation of the procedure. Local anesthesia was provided with 1% lidocaine with epinephrine. Ultrasound survey was performed of the hilum of the liver and the subhepatic liver in the right upper quadrant. Significant gas was present in the hilum of the liver, and differentiating what may be colon versus is a gas filled collection in this area was not possible. We elected to drain the sub a Paddock collection as this appears to communicate on the preoperative MRI. 1% lidocaine was used for local anesthesia. A trocar needle was advanced into the subhepatic fluid collection with ultrasound guidance. Modified Seldinger technique was used to place a 10 French pigtail catheter drain. Immediate return of similar fluid to the surgical drain. We then proceeded with PTC. Ultrasound survey of the left liver lobe was performed, with then ultrasound of the right liver lobe. 1% lidocaine was used for local anesthesia, with generous infiltration of the skin and subcutaneous tissues in and inter left costal location. A Chiba needle was advanced under ultrasound guidance into the right liver lobe, using ultrasound guidance in attempt to access a biliary duct. We were successful in accessing portal vein, hepatic vein, and at  1 point distal hepatic artery. Given the small size of the biliary system/decompressed biliary system ultrasound was abandoned. We then proceeded with a formal PTC attempt. The access needle was advanced from a mid axillary line, eleventh rib into the a Paddock parenchyma. A traditional pullback contrast injection technique was used. It took 3 passes of the needle before distal biliary radicles were opacified. Intermittent opacification of the biliary system using this first needle position was possible in opacifying the more central biliary ducts. A second access was then used for access into the biliary system, from intercostal location. This second needle was placed into the more central biliary ducts using fluoroscopic guidance with multiple obliquity. Once the tip of this needle was confirmed within the more central biliary system, an 018 wire was advanced centrally. The needle was removed, a small incision was made with an 11 blade scalpel, and then a triaxial Accustick system was advanced into the biliary system. The metal stiffener and dilator were removed, we confirmed placement with contrast infusion. A coaxial Glidewire and 4 French glide cath were then used to navigate across the obstruction at the ampulla into the duodenum. Once the catheter was within the duodenum, the wire was removed and contrast confirmed location. A coon's wire was advanced through the system, and the Accustick and Glidewire were removed. Dilation of the subcutaneous tissue tracks was performed with a 10 [place] dilator, then a 12 [place] dilator, and then a 74 [place] biliary drain was placed as an internal/external biliary drain. Small amount of  contrast confirmed location. The patient tolerated the procedure well and remained hemodynamically stable throughout. No complications were encountered and no significant blood loss was encountered. IMPRESSION: Status post ultrasound-guided subhepatic abscess drainage, as well as image  guided percutaneous transhepatic cholangiogram with internal/external drainage. Signed, Yvone Neu. Miachel Roux, RPVI [facility] Radiology Electronically Signed   By: Gilmer Mor D.O.   On: 08/10/2023 15:41   IR BILIARY DRAIN PLACEMENT WITH CHOLANGIOGRAM Result Date: 08/10/2023 INDICATION: 73 year old male with a history of biloma and bile leak after prior partial cholecystectomy. He presents for drainage and internal/external biliary drain. EXAM: ULTRASOUND-GUIDED DRAINAGE OF SUBHEPATIC ABSCESS IMAGE GUIDED PERCUTANEOUS TRANSHEPATIC CHOLANGIOGRAM WITH INTERNAL/EXTERNAL BILIARY DRAIN. MEDICATIONS: None ANESTHESIA/SEDATION: Moderate (conscious) sedation was employed during this procedure. A total of Versed 2.0 mg and Fentanyl 100 mcg, 1 mg Dilaudid, was administered intravenously by the radiology nurse. Total intra-service moderate Sedation Time: 82 minutes. The patient's level of consciousness and vital signs were monitored continuously by radiology nursing throughout the procedure under my direct supervision. FLUOROSCOPY: Radiation Exposure Index (as provided by the fluoroscopic device): 387 mGy Kerma COMPLICATIONS: None PROCEDURE: The procedure, risks, benefits, and alternatives were explained to the patient and the patient's family. A complete informed consent was performed, with risk benefit analysis. Specific risks that were discussed for the procedure include bleeding, infection, biliary sepsis, IC use day, organ injury, need for further procedure, need for further surgery, long-term drain placement, cardiopulmonary collapse, death. Questions regarding the procedure were encouraged and answered. The patient understands and consents to the procedure. Patient is position in supine position on the fluoroscopy table, and the upper abdomen was prepped and draped in the usual sterile fashion. Maximum barrier sterile technique with sterile gowns and gloves were  used for the procedure. A timeout was performed prior to the initiation of the procedure. Local anesthesia was provided with 1% lidocaine with epinephrine. Ultrasound survey was performed of the hilum of the liver and the subhepatic liver in the right upper quadrant. Significant gas was present in the hilum of the liver, and differentiating what may be colon versus is a gas filled collection in this area was not possible. We elected to drain the sub a Paddock collection as this appears to communicate on the preoperative MRI. 1% lidocaine was used for local anesthesia. A trocar needle was advanced into the subhepatic fluid collection with ultrasound guidance. Modified Seldinger technique was used to place a 10 French pigtail catheter drain. Immediate return of similar fluid to the surgical drain. We then proceeded with PTC. Ultrasound survey of the left liver lobe was performed, with then ultrasound of the right liver lobe. 1% lidocaine was used for local anesthesia, with generous infiltration of the skin and subcutaneous tissues in and inter left costal location. A Chiba needle was advanced under ultrasound guidance into the right liver lobe, using ultrasound guidance in attempt to access a biliary duct. We were successful in accessing portal vein, hepatic vein, and at 1 point distal hepatic artery. Given the small size of the biliary system/decompressed biliary system ultrasound was abandoned. We then proceeded with a formal PTC attempt. The access needle was advanced from a mid axillary line, eleventh rib into the a Paddock parenchyma. A traditional pullback contrast injection technique was used. It took 3 passes of the needle before distal biliary radicles were opacified. Intermittent opacification of the biliary system using this first needle position was possible in opacifying the more central biliary ducts. A second access was  then used for access into the biliary system, from intercostal location. This second  needle was placed into the more central biliary ducts using fluoroscopic guidance with multiple obliquity. Once the tip of this needle was confirmed within the more central biliary system, an 018 wire was advanced centrally. The needle was removed, a small incision was made with an 11 blade scalpel, and then a triaxial Accustick system was advanced into the biliary system. The metal stiffener and dilator were removed, we confirmed placement with contrast infusion. A coaxial Glidewire and 4 French glide cath were then used to navigate across the obstruction at the ampulla into the duodenum. Once the catheter was within the duodenum, the wire was removed and contrast confirmed location. A coon's wire was advanced through the system, and the Accustick and Glidewire were removed. Dilation of the subcutaneous tissue tracks was performed with a 10 [place] dilator, then a 12 [place] dilator, and then a 44 [place] biliary drain was placed as an internal/external biliary drain. Small amount of contrast confirmed location. The patient tolerated the procedure well and remained hemodynamically stable throughout. No complications were encountered and no significant blood loss was encountered. IMPRESSION: Status post ultrasound-guided subhepatic abscess drainage, as well as image guided percutaneous transhepatic cholangiogram with internal/external drainage. Signed, Yvone Neu. Miachel Roux, RPVI [facility] Radiology Electronically Signed   By: Gilmer Mor D.O.   On: 08/10/2023 15:41        Francisco Padilla M.D. [facility] 08/11/2023, 12:53 PM  Available via Epic secure chat 7am-7pm After 7 pm, please refer to night coverage provider listed on amion.

## 2023-08-11 NOTE — Progress Notes (Signed)
Mobility Specialist Progress Note:   08/11/23 1515  Mobility  Activity Transferred to/from [facility]  Level of Assistance Minimal assist, patient does 75% or more  Assistive Device Front wheel walker  Distance Ambulated (ft) 3 ft  Activity Response Tolerated well  Mobility Referral Yes  Mobility visit 1 Mobility  Mobility Specialist Start Time (ACUTE ONLY) 1505  Mobility Specialist Stop Time (ACUTE ONLY) 1525  Mobility Specialist Time Calculation (min) (ACUTE ONLY) 20 min   Pt requesting to transfer to [facility] for BM. Found to be soiled in bed upon moving. Required minA for bed mobility, minA to stand from elevated bed, and to take pivotal steps. Pt c/o abdominal soreness from incisions. Left sitting on BSC, with call bell in reach. NT aware.   Addison Lank Mobility Specialist Please contact via SecureChat or  Rehab office at [phone]

## 2023-08-11 NOTE — Plan of Care (Signed)

## 2023-08-12 ENCOUNTER — Inpatient Hospital Stay (HOSPITAL_COMMUNITY): Payer: Medicare Other

## 2023-08-12 DIAGNOSIS — K810 Acute cholecystitis: Secondary | ICD-10-CM | POA: Diagnosis not present

## 2023-08-12 DIAGNOSIS — I350 Nonrheumatic aortic (valve) stenosis: Secondary | ICD-10-CM | POA: Diagnosis not present

## 2023-08-12 DIAGNOSIS — I4891 Unspecified atrial fibrillation: Secondary | ICD-10-CM | POA: Diagnosis not present

## 2023-08-12 DIAGNOSIS — N179 Acute kidney failure, unspecified: Secondary | ICD-10-CM | POA: Diagnosis not present

## 2023-08-12 LAB — COMPREHENSIVE METABOLIC PANEL
ALT: 16 U/L (ref 0–44)
AST: 14 U/L — ABNORMAL LOW (ref 15–41)
Albumin: 2.2 g/dL — ABNORMAL LOW (ref 3.5–5.0)
Alkaline Phosphatase: 48 U/L (ref 38–126)
Anion gap: 5 (ref 5–15)
BUN: 17 mg/dL (ref 8–23)
CO2: 29 mmol/L (ref 22–32)
Calcium: 8.1 mg/dL — ABNORMAL LOW (ref 8.9–10.3)
Chloride: 100 mmol/L (ref 98–111)
Creatinine, Ser: 0.66 mg/dL (ref 0.61–1.24)
GFR, Estimated: >60 mL/min (ref >60)
Glucose, Bld: 87 mg/dL (ref 70–99)
Potassium: 4.3 mmol/L (ref 3.5–5.1)
Sodium: 134 mmol/L — ABNORMAL LOW (ref 135–145)
Total Bilirubin: 0.7 mg/dL (ref 0.0–1.2)
Total Protein: 4.8 g/dL — ABNORMAL LOW (ref 6.5–8.1)

## 2023-08-12 LAB — CBC
HCT: 28.8 % — ABNORMAL LOW (ref 39.0–52.0)
Hemoglobin: 9.5 g/dL — ABNORMAL LOW (ref 13.0–17.0)
MCH: 30.6 pg (ref 26.0–34.0)
MCHC: 33 g/dL (ref 30.0–36.0)
MCV: 92.9 fL (ref 80.0–100.0)
Platelets: 364 10*3/uL (ref 150–400)
RBC: 3.1 MIL/uL — ABNORMAL LOW (ref 4.22–5.81)
RDW: 13.4 % (ref 11.5–15.5)
WBC: 14.5 10*3/uL — ABNORMAL HIGH (ref 4.0–10.5)
nRBC: 0 % (ref 0.0–0.2)

## 2023-08-12 LAB — HEPARIN LEVEL (UNFRACTIONATED)
Heparin Unfractionated: 0.23 [IU]/mL — ABNORMAL LOW (ref 0.30–0.70)
Heparin Unfractionated: 0.25 [IU]/mL — ABNORMAL LOW (ref 0.30–0.70)

## 2023-08-12 LAB — BRAIN NATRIURETIC PEPTIDE: B Natriuretic Peptide: 705.3 pg/mL — ABNORMAL HIGH (ref 0.0–100.0)

## 2023-08-12 MED ORDER — GERHARDT'S BUTT CREAM
TOPICAL_CREAM | CUTANEOUS | Status: DC | PRN
  Filled 2023-08-12 (×2): qty 60

## 2023-08-12 MED ORDER — FUROSEMIDE 20 MG PO TABS
20.0000 mg | ORAL_TABLET | Freq: Once | ORAL | Status: AC
  Administered 2023-08-12: 20 mg via ORAL
  Filled 2023-08-12: qty 1

## 2023-08-12 MED ORDER — ZOLPIDEM TARTRATE 5 MG PO TABS
10.0000 mg | ORAL_TABLET | Freq: Every day | ORAL | Status: DC
  Administered 2023-08-12 – 2023-08-24 (×13): 10 mg via ORAL
  Filled 2023-08-12 (×13): qty 2

## 2023-08-12 MED ORDER — ACETAMINOPHEN 650 MG RE SUPP
650.0000 mg | Freq: Four times a day (QID) | RECTAL | Status: DC
  Filled 2023-08-12 (×3): qty 1

## 2023-08-12 MED ORDER — HYDROMORPHONE HCL 1 MG/ML IJ SOLN
0.5000 mg | INTRAMUSCULAR | Status: DC | PRN
  Administered 2023-08-14 – 2023-08-17 (×3): 0.5 mg via INTRAVENOUS
  Filled 2023-08-12 (×3): qty 0.5

## 2023-08-12 MED ORDER — OXYCODONE HCL 5 MG PO TABS
5.0000 mg | ORAL_TABLET | ORAL | Status: DC | PRN
Start: 2023-08-12 — End: 2023-08-25
  Administered 2023-08-12: 10 mg via ORAL
  Administered 2023-08-12: 5 mg via ORAL
  Administered 2023-08-12 – 2023-08-17 (×25): 10 mg via ORAL
  Administered 2023-08-18 (×2): 5 mg via ORAL
  Administered 2023-08-18 – 2023-08-19 (×5): 10 mg via ORAL
  Administered 2023-08-19: 5 mg via ORAL
  Administered 2023-08-19: 10 mg via ORAL
  Administered 2023-08-19: 5 mg via ORAL
  Administered 2023-08-20 – 2023-08-21 (×7): 10 mg via ORAL
  Administered 2023-08-21: 5 mg via ORAL
  Administered 2023-08-21 – 2023-08-25 (×19): 10 mg via ORAL
  Filled 2023-08-12 (×5): qty 2
  Filled 2023-08-12: qty 1
  Filled 2023-08-12 (×3): qty 2
  Filled 2023-08-12: qty 1
  Filled 2023-08-12 (×11): qty 2
  Filled 2023-08-12: qty 1
  Filled 2023-08-12 (×18): qty 2
  Filled 2023-08-12: qty 1
  Filled 2023-08-12 (×8): qty 2
  Filled 2023-08-12: qty 1
  Filled 2023-08-12 (×12): qty 2
  Filled 2023-08-12 (×2): qty 1
  Filled 2023-08-12 (×4): qty 2

## 2023-08-12 MED ORDER — ACETAMINOPHEN 500 MG PO TABS
1000.0000 mg | ORAL_TABLET | Freq: Four times a day (QID) | ORAL | Status: DC
  Administered 2023-08-13 – 2023-08-25 (×41): 1000 mg via ORAL
  Filled 2023-08-12 (×46): qty 2

## 2023-08-12 MED ORDER — TRAMADOL HCL 50 MG PO TABS: 50.0000 mg | ORAL_TABLET | Freq: Four times a day (QID) | ORAL | Status: DC | PRN

## 2023-08-12 NOTE — Progress Notes (Signed)
[facility]                                                                              Francisco Padilla, is a 73 y.o. male, DOB - 03/29/1951, WUJ:[id number] Admit date - 07/28/2023    Outpatient Primary MD for the patient is [name], MD  LOS - 14  days  Chief Complaint  Patient presents with   Weakness       Brief summary   Patient is a 73 year old male with pertinent PMH OSA on CPAP, HLD, chronic hypotension, PAF on Eliquis, PVD presents to [facility] ED on 1/5 with cholecystitis.   Patient complaining of weakness/fatigue going on for about a month.  Patient also having nausea, abdominal pain, diarrhea.  Abdominal pain worse in right upper quadrant.  Denies fever/chills.  Came to [facility] ED on 1/5 for further workup.  On arrival patient noted to be slightly tachycardic 100s and mildly hypotensive 93/73.  CT ABD/pelvis showing acute cholecystitis.  Surgery consulted.  Given IV fluids and Zosyn.  Awaiting surgery 48 hours for Eliquis washout. Cards consulted for preoperative evaluation of cholecystectomy. Echo on 1/7 showing lvef 60-65%; severe aortic stenosis.  On 1/8 patient taken to the OR for cholecystectomy.  While in the OR patient had tachycardia 150s and started on esmolol.  Patient later became hypotensive and started on neo.  Postop patient taken to ICU while on pressors.  PCCM consulted.   1/5 admitted with cholecystitis 1/8 s/p cholecystectomy; given esmolol for HR 150s then became hypotensive on neo-; postop taken to ICU and PCCM consulted 1/10 episode of rapid A-fib with RVR in the 150s, systolic pressure in the 70s. 95/62: no clinical signs of bile leak, and no need for ERCP.  01/14: worsening drain out put, suggesting resumption of bile leak. Plan for transfer to tertiary care.   1/17: Image guided drain into subhepatic abscess, PTC   Assessment & Plan    Principal Problem:   Acute cholecystitis Gangrenous cholecystitis, complicated with sepsis status  post subtotal cholecystectomy by Dr. Hillery Hunter on 1/8  -Postop ICU care due to tachycardia requiring esmolol and subsequent hypotension/shock requiring vasopressors, A-fib with RVR. -Completed antibiotic management with Zosyn -MRCP: undrained fluid collection that the surgical drain traverses but unable to completely evacuate -IR following, s/p image guided drain into the subhepatic abscess and PTC on 1/17 (Dr. Loreta Ave) -Cultures + gram-negative rods, continue IV Zosyn  -Management per surgery -Starting on p.o. pain medications, he states that he does not have allergy to oxycodone, just has not preferred in the past, he had " weird dreams" but no actual hallucinations with oxycodone use, 10 years ago.  If patient is able to tolerate oxycodone for pain, requested to remove allergy from his chart. - decrease IV pain medications/ Dilaudid, discussed with patient.    Chronic hypotension -Initially placed on Solu-Cortef, stress dose steroids have been discontinued -Continue Florinef 0.1 mg daily, on midodrine -B12, folate normal, a.m. cortisol also normal 15.0 -Had received IV albumin x 3 on 1/17 for severe hypoalbuminemia 1.7 -Overnight again received IV fluids, patient has chronic hypotension and if he is asymptomatic, no need for  fluids. -Today complaining of dyspnea, congestion, BNP 705.3, ordered chest x-ray -Will give 1 dose of Lasix 20 mg p.o. x 1 -Continue midodrine 10 mg 3 times daily   Hypokalemia -Replace as needed    AKI (acute kidney injury) (HCC) -Creatinine stable  Hyponatremia -Resolved      Aortic stenosis, severe Stable, no signs of acute decompensation -Volume management   Atrial fibrillation with RVR (HCC) -Rate controlled -Continue midodrine for BP -Heparin resumed   History of Roux-en-Y gastric bypass Follow up as outpatient.    OSA on CPAP Continue Home Cpap.    Pressure injury documentation Right buttocks, stage I, Not present on admission -Nursing  care     Severe calorie malnutrition Nutrition Problem: Severe Malnutrition Etiology: chronic illness (gastric bypass) Signs/Symptoms: severe fat depletion, severe muscle depletion Interventions: Ensure Enlive (each supplement provides 350kcal and 20 grams of protein), MVI, Carnation Instant Breakfast  Estimated body mass index is 26.69 kg/m as calculated from the following:   Height as of this encounter: 6\' 4"  (1.93 m).   Weight as of this encounter: 99.5 kg.  Code Status: Full code DVT Prophylaxis:  SCDs Start: 07/29/23 0131 Place TED hose Start: 07/29/23 0131   Level of Care: Level of care: Telemetry Cardiac Family Communication: Updated patient' Disposition Plan:      Remains inpatient appropriate:    Procedures:  08/01/2023 subtotal cholecystectomy (Dr Melody Haver, general surgery) 1/17: Image guided drain into subhepatic abscess, PTC  Consultants:   Cardiology General Surgery Critical care  Antimicrobials:   Anti-infectives (From admission, onward)    Start     Dose/Rate Route Frequency Ordered Stop   08/09/23 1400  cefTRIAXone (ROCEPHIN) 2 g in sodium chloride 0.9 % 100 mL IVPB  Status:  Discontinued        2 g 200 mL/hr over 30 Minutes Intravenous To Radiology 08/09/23 1254 08/10/23 0716   08/09/23 1245  cefTRIAXone (ROCEPHIN) 2 g in sodium chloride 0.9 % 100 mL IVPB  Status:  Discontinued       Note to Pharmacy: Please do not give on floor. To be given in IR.   2 g 200 mL/hr over 30 Minutes Intravenous To Radiology 08/09/23 1152 08/09/23 1213   08/07/23 1100  piperacillin-tazobactam (ZOSYN) IVPB 3.375 g        3.375 g 12.5 mL/hr over 240 Minutes Intravenous Every 8 hours 08/07/23 1008     08/07/23 0000  piperacillin-tazobactam (ZOSYN) 3.375 GM/50ML IVPB        3.375 g Intravenous Every 8 hours 08/07/23 1501     07/29/23 0800  piperacillin-tazobactam (ZOSYN) IVPB 3.375 g        3.375 g 12.5 mL/hr over 240 Minutes Intravenous Every 8 hours 07/29/23 0207  08/06/23 2359   07/29/23 0030  piperacillin-tazobactam (ZOSYN) IVPB 3.375 g        3.375 g 100 mL/hr over 30 Minutes Intravenous  Once 07/29/23 0020 07/29/23 0147          Medications  acetaminophen  1,000 mg Oral Q6H   Or   acetaminophen  650 mg Rectal Q6H   acidophilus  1 capsule Oral Daily   alfuzosin  10 mg Oral Q breakfast   calcium carbonate  200 mg of elemental calcium Oral TID   Chlorhexidine Gluconate Cloth  6 each Topical Daily   docusate sodium  100 mg Oral BID   feeding supplement  237 mL Oral BID BM   fludrocortisone  0.1 mg Oral Daily   gabapentin  600 mg Oral QHS   latanoprost  1 drop Both Eyes QHS   midodrine  10 mg Oral TID WC   multivitamin with minerals  1 tablet Oral BID   nutrition supplement (JUVEN)  1 packet Oral BID BM   pantoprazole  40 mg Oral Daily   polyethylene glycol  17 g Oral Daily   sodium chloride flush  3 mL Intravenous Q12H   sodium chloride flush  5 mL Intracatheter Q8H   venlafaxine XR  37.5 mg Oral Q breakfast   zolpidem  10 mg Oral QHS      Subjective:   Araceli Bouche was seen and examined today.  Overnight received IV fluid hydration for borderline BP, asymptomatic.  States felt short of breath on lying flat, felt congested this morning.  No chest pain, nausea or vomiting.  Tolerating diet.   Objective:   Vitals:   08/12/23 0016 08/12/23 0400 08/12/23 0500 08/12/23 0750  BP: 106/71 (!) 102/56  99/68  Pulse:  84  85  Resp: 17 18  17   Temp: 98.3 F (36.8 C) 98.4 F (36.9 C)  99.3 F (37.4 C)  TempSrc: Oral Oral  Oral  SpO2:  100%  100%  Weight:   99.5 kg   Height:        Intake/Output Summary (Last 24 hours) at 08/12/2023 1120 Last data filed at 08/12/2023 1047 Gross per 24 hour  Intake 1421.89 ml  Output 3392.5 ml  Net -1970.61 ml     Wt Readings from Last 3 Encounters:  08/12/23 99.5 kg  07/19/23 92.4 kg  06/14/23 96.9 kg    Physical Exam General: Alert and oriented x 3, NAD Cardiovascular: S1 S2 clear,  RRR.  Respiratory: Diminished breath sounds at the bases, no wheezing Gastrointestinal: Soft, 3 drains+ Ext: no pedal edema bilaterally Neuro: no new deficits Psych: Normal affect      Data Reviewed:  I have personally reviewed following labs    CBC Lab Results  Component Value Date   WBC 14.5 (H) 08/12/2023   RBC 3.10 (L) 08/12/2023   HGB 9.5 (L) 08/12/2023   HCT 28.8 (L) 08/12/2023   MCV 92.9 08/12/2023   MCH 30.6 08/12/2023   PLT 364 08/12/2023   MCHC 33.0 08/12/2023   RDW 13.4 08/12/2023   LYMPHSABS 0.7 08/07/2023   MONOABS 0.9 08/07/2023   EOSABS 0.0 08/07/2023   BASOSABS 0.0 08/07/2023     Last metabolic panel Lab Results  Component Value Date   NA 134 (L) 08/12/2023   K 4.3 08/12/2023   CL 100 08/12/2023   CO2 29 08/12/2023   BUN 17 08/12/2023   CREATININE 0.66 08/12/2023   GLUCOSE 87 08/12/2023   GFRNONAA >60 08/12/2023   GFRAA  10/23/2009    >60        The eGFR has been calculated using the MDRD equation. This calculation has not been validated in all clinical situations. eGFR's persistently <60 mL/min signify possible Chronic Kidney Disease.   CALCIUM 8.1 (L) 08/12/2023   PROT 4.8 (L) 08/12/2023   ALBUMIN 2.2 (L) 08/12/2023   BILITOT 0.7 08/12/2023   ALKPHOS 48 08/12/2023   AST 14 (L) 08/12/2023   ALT 16 08/12/2023   ANIONGAP 5 08/12/2023    CBG (last 3)  No results for input(s): "GLUCAP" in the last 72 hours.     Coagulation Profile: Recent Labs  Lab 08/09/23 1459  INR 1.1      Radiology Studies: I have personally reviewed the imaging  studies  IR US ABSCESS DRAIN PLACEMENT Result Date: 08/10/2023 INDICATION: 73 year old male with a history of biloma and bile leak after prior partial cholecystectomy. He presents for drainage and internal/external biliary drain. EXAM: ULTRASOUND-GUIDED DRAINAGE OF SUBHEPATIC ABSCESS IMAGE GUIDED PERCUTANEOUS TRANSHEPATIC CHOLANGIOGRAM WITH INTERNAL/EXTERNAL BILIARY DRAIN. MEDICATIONS: None  ANESTHESIA/SEDATION: Moderate (conscious) sedation was employed during this procedure. A total of Versed 2.0 mg and Fentanyl 100 mcg, 1 mg Dilaudid, was administered intravenously by the radiology nurse. Total intra-service moderate Sedation Time: 82 minutes. The patient's level of consciousness and vital signs were monitored continuously by radiology nursing throughout the procedure under my direct supervision. FLUOROSCOPY: Radiation Exposure Index (as provided by the fluoroscopic device): 387 mGy Kerma COMPLICATIONS: None PROCEDURE: The procedure, risks, benefits, and alternatives were explained to the patient and the patient's family. A complete informed consent was performed, with risk benefit analysis. Specific risks that were discussed for the procedure include bleeding, infection, biliary sepsis, IC use day, organ injury, need for further procedure, need for further surgery, long-term drain placement, cardiopulmonary collapse, death. Questions regarding the procedure were encouraged and answered. The patient understands and consents to the procedure. Patient is position in supine position on the fluoroscopy table, and the upper abdomen was prepped and draped in the usual sterile fashion. Maximum barrier sterile technique with sterile gowns and gloves were used for the procedure. A timeout was performed prior to the initiation of the procedure. Local anesthesia was provided with 1% lidocaine with epinephrine. Ultrasound survey was performed of the hilum of the liver and the subhepatic liver in the right upper quadrant. Significant gas was present in the hilum of the liver, and differentiating what may be colon versus is a gas filled collection in this area was not possible. We elected to drain the sub a Paddock collection as this appears to communicate on the preoperative MRI. 1% lidocaine was used for local anesthesia. A trocar needle was advanced into the subhepatic fluid collection with ultrasound guidance.  Modified Seldinger technique was used to place a 10 French pigtail catheter drain. Immediate return of similar fluid to the surgical drain. We then proceeded with PTC. Ultrasound survey of the left liver lobe was performed, with then ultrasound of the right liver lobe. 1% lidocaine was used for local anesthesia, with generous infiltration of the skin and subcutaneous tissues in and inter left costal location. A Chiba needle was advanced under ultrasound guidance into the right liver lobe, using ultrasound guidance in attempt to access a biliary duct. We were successful in accessing portal vein, hepatic vein, and at 1 point distal hepatic artery. Given the small size of the biliary system/decompressed biliary system ultrasound was abandoned. We then proceeded with a formal PTC attempt. The access needle was advanced from a mid axillary line, eleventh rib into the a Paddock parenchyma. A traditional pullback contrast injection technique was used. It took 3 passes of the needle before distal biliary radicles were opacified. Intermittent opacification of the biliary system using this first needle position was possible in opacifying the more central biliary ducts. A second access was then used for access into the biliary system, from intercostal location. This second needle was placed into the more central biliary ducts using fluoroscopic guidance with multiple obliquity. Once the tip of this needle was confirmed within the more central biliary system, an 018 wire was advanced centrally. The needle was removed, a small incision was made with an 11 blade scalpel, and then a triaxial Accustick system was advanced into the  biliary system. The metal stiffener and dilator were removed, we confirmed placement with contrast infusion. A coaxial Glidewire and 4 French glide cath were then used to navigate across the obstruction at the ampulla into the duodenum. Once the catheter was within the duodenum, the wire was removed and  contrast confirmed location. A coon's wire was advanced through the system, and the Accustick and Glidewire were removed. Dilation of the subcutaneous tissue tracks was performed with a 10 [place] dilator, then a 12 [place] dilator, and then a 66 [place] biliary drain was placed as an internal/external biliary drain. Small amount of contrast confirmed location. The patient tolerated the procedure well and remained hemodynamically stable throughout. No complications were encountered and no significant blood loss was encountered. IMPRESSION: Status post ultrasound-guided subhepatic abscess drainage, as well as image guided percutaneous transhepatic cholangiogram with internal/external drainage. Signed, Yvone Neu. Miachel Roux, RPVI [facility] Radiology Electronically Signed   By: Gilmer Mor D.O.   On: 08/10/2023 15:41   IR BILIARY DRAIN PLACEMENT WITH CHOLANGIOGRAM Result Date: 08/10/2023 INDICATION: 73 year old male with a history of biloma and bile leak after prior partial cholecystectomy. He presents for drainage and internal/external biliary drain. EXAM: ULTRASOUND-GUIDED DRAINAGE OF SUBHEPATIC ABSCESS IMAGE GUIDED PERCUTANEOUS TRANSHEPATIC CHOLANGIOGRAM WITH INTERNAL/EXTERNAL BILIARY DRAIN. MEDICATIONS: None ANESTHESIA/SEDATION: Moderate (conscious) sedation was employed during this procedure. A total of Versed 2.0 mg and Fentanyl 100 mcg, 1 mg Dilaudid, was administered intravenously by the radiology nurse. Total intra-service moderate Sedation Time: 82 minutes. The patient's level of consciousness and vital signs were monitored continuously by radiology nursing throughout the procedure under my direct supervision. FLUOROSCOPY: Radiation Exposure Index (as provided by the fluoroscopic device): 387 mGy Kerma COMPLICATIONS: None PROCEDURE: The procedure, risks, benefits, and alternatives were explained to the patient and the patient's family. A complete  informed consent was performed, with risk benefit analysis. Specific risks that were discussed for the procedure include bleeding, infection, biliary sepsis, IC use day, organ injury, need for further procedure, need for further surgery, long-term drain placement, cardiopulmonary collapse, death. Questions regarding the procedure were encouraged and answered. The patient understands and consents to the procedure. Patient is position in supine position on the fluoroscopy table, and the upper abdomen was prepped and draped in the usual sterile fashion. Maximum barrier sterile technique with sterile gowns and gloves were used for the procedure. A timeout was performed prior to the initiation of the procedure. Local anesthesia was provided with 1% lidocaine with epinephrine. Ultrasound survey was performed of the hilum of the liver and the subhepatic liver in the right upper quadrant. Significant gas was present in the hilum of the liver, and differentiating what may be colon versus is a gas filled collection in this area was not possible. We elected to drain the sub a Paddock collection as this appears to communicate on the preoperative MRI. 1% lidocaine was used for local anesthesia. A trocar needle was advanced into the subhepatic fluid collection with ultrasound guidance. Modified Seldinger technique was used to place a 10 French pigtail catheter drain. Immediate return of similar fluid to the surgical drain. We then proceeded with PTC. Ultrasound survey of the left liver lobe was performed, with then ultrasound of the right liver lobe. 1% lidocaine was used for local anesthesia, with generous infiltration of the skin and subcutaneous tissues in and inter left costal location. A Chiba needle was advanced under ultrasound guidance into the right liver lobe, using ultrasound guidance in attempt to access  a biliary duct. We were successful in accessing portal vein, hepatic vein, and at 1 point distal hepatic artery.  Given the small size of the biliary system/decompressed biliary system ultrasound was abandoned. We then proceeded with a formal PTC attempt. The access needle was advanced from a mid axillary line, eleventh rib into the a Paddock parenchyma. A traditional pullback contrast injection technique was used. It took 3 passes of the needle before distal biliary radicles were opacified. Intermittent opacification of the biliary system using this first needle position was possible in opacifying the more central biliary ducts. A second access was then used for access into the biliary system, from intercostal location. This second needle was placed into the more central biliary ducts using fluoroscopic guidance with multiple obliquity. Once the tip of this needle was confirmed within the more central biliary system, an 018 wire was advanced centrally. The needle was removed, a small incision was made with an 11 blade scalpel, and then a triaxial Accustick system was advanced into the biliary system. The metal stiffener and dilator were removed, we confirmed placement with contrast infusion. A coaxial Glidewire and 4 French glide cath were then used to navigate across the obstruction at the ampulla into the duodenum. Once the catheter was within the duodenum, the wire was removed and contrast confirmed location. A coon's wire was advanced through the system, and the Accustick and Glidewire were removed. Dilation of the subcutaneous tissue tracks was performed with a 10 [place] dilator, then a 12 [place] dilator, and then a 76 [place] biliary drain was placed as an internal/external biliary drain. Small amount of contrast confirmed location. The patient tolerated the procedure well and remained hemodynamically stable throughout. No complications were encountered and no significant blood loss was encountered. IMPRESSION: Status post ultrasound-guided subhepatic abscess drainage, as well as image guided percutaneous transhepatic  cholangiogram with internal/external drainage. Signed, Yvone Neu. Miachel Roux, RPVI [facility] Radiology Electronically Signed   By: Gilmer Mor D.O.   On: 08/10/2023 15:41        Torryn Fiske M.D. [facility] 08/12/2023, 11:20 AM  Available via Epic secure chat 7am-7pm After 7 pm, please refer to night coverage provider listed on amion.

## 2023-08-12 NOTE — Progress Notes (Signed)
11 Days Post-Op  Subjective: A bit of congestion today.  Otherwise having some insomnia issues.  Eating well.  Had a good BM yesterday.  Objective: Vital signs in last 24 hours: Temp:  [97.7 F (36.5 C)-99.3 F (37.4 C)] 99.3 F (37.4 C) (01/19 0750) Pulse Rate:  [82-93] 85 (01/19 0750) Resp:  [14-18] 17 (01/19 0750) BP: (86-106)/(53-73) 99/68 (01/19 0750) SpO2:  [100 %] 100 % (01/19 0750) Weight:  [99.5 kg] 99.5 kg (01/19 0500) Last BM Date : 08/08/23  Intake/Output from previous day: 01/18 0701 - 01/19 0700 In: 1266 [P.O.:480; I.V.:558.8; IV Piggyback:227.2] Out: 2885 [Urine:1000; Drains:1885] Intake/Output this shift: Total I/O In: 120 [P.O.:120] Out: -   PE: Gen: NAD Abd: soft, 3 drains present.  Surgical drain with minimal tan purulent, but much thinner output - 30cc, new IR drain with bloody purulent output, ?975cc yesterday.  Minimal output in this today and looks much better, PTC with 880cc of thin bilious output documented.  Bag is about half full currently.  Lab Results:  Recent Labs    08/11/23 0500 08/12/23 0411  WBC 11.2* 14.5*  HGB 9.4* 9.5*  HCT 28.7* 28.8*  PLT 337 364   BMET Recent Labs    08/11/23 0500 08/12/23 0411  NA 136 134*  K 4.3 4.3  CL 103 100  CO2 29 29  GLUCOSE 85 87  BUN 11 17  CREATININE 0.71 0.66  CALCIUM 8.3* 8.1*   PT/INR Recent Labs    08/09/23 1459  LABPROT 14.9  INR 1.1   CMP     Component Value Date/Time   NA 134 (L) 08/12/2023 0411   K 4.3 08/12/2023 0411   CL 100 08/12/2023 0411   CO2 29 08/12/2023 0411   GLUCOSE 87 08/12/2023 0411   BUN 17 08/12/2023 0411   CREATININE 0.66 08/12/2023 0411   CREATININE 0.77 07/19/2023 1128   CALCIUM 8.1 (L) 08/12/2023 0411   PROT 4.8 (L) 08/12/2023 0411   ALBUMIN 2.2 (L) 08/12/2023 0411   AST 14 (L) 08/12/2023 0411   ALT 16 08/12/2023 0411   ALKPHOS 48 08/12/2023 0411   BILITOT 0.7 08/12/2023 0411   GFRNONAA >60 08/12/2023 0411   GFRAA  10/23/2009 0135    >60         The eGFR has been calculated using the MDRD equation. This calculation has not been validated in all clinical situations. eGFR's persistently <60 mL/min signify possible Chronic Kidney Disease.   Lipase     Component Value Date/Time   LIPASE 29 07/29/2023 0014       Studies/Results: IR US ABSCESS DRAIN PLACEMENT Result Date: 08/10/2023 INDICATION: 73 year old male with a history of biloma and bile leak after prior partial cholecystectomy. He presents for drainage and internal/external biliary drain. EXAM: ULTRASOUND-GUIDED DRAINAGE OF SUBHEPATIC ABSCESS IMAGE GUIDED PERCUTANEOUS TRANSHEPATIC CHOLANGIOGRAM WITH INTERNAL/EXTERNAL BILIARY DRAIN. MEDICATIONS: None ANESTHESIA/SEDATION: Moderate (conscious) sedation was employed during this procedure. A total of Versed 2.0 mg and Fentanyl 100 mcg, 1 mg Dilaudid, was administered intravenously by the radiology nurse. Total intra-service moderate Sedation Time: 82 minutes. The patient's level of consciousness and vital signs were monitored continuously by radiology nursing throughout the procedure under my direct supervision. FLUOROSCOPY: Radiation Exposure Index (as provided by the fluoroscopic device): 387 mGy Kerma COMPLICATIONS: None PROCEDURE: The procedure, risks, benefits, and alternatives were explained to the patient and the patient's family. A complete informed consent was performed, with risk benefit analysis. Specific risks that were discussed for the procedure include  bleeding, infection, biliary sepsis, IC use day, organ injury, need for further procedure, need for further surgery, long-term drain placement, cardiopulmonary collapse, death. Questions regarding the procedure were encouraged and answered. The patient understands and consents to the procedure. Patient is position in supine position on the fluoroscopy table, and the upper abdomen was prepped and draped in the usual sterile fashion. Maximum barrier sterile technique with  sterile gowns and gloves were used for the procedure. A timeout was performed prior to the initiation of the procedure. Local anesthesia was provided with 1% lidocaine with epinephrine. Ultrasound survey was performed of the hilum of the liver and the subhepatic liver in the right upper quadrant. Significant gas was present in the hilum of the liver, and differentiating what may be colon versus is a gas filled collection in this area was not possible. We elected to drain the sub a Paddock collection as this appears to communicate on the preoperative MRI. 1% lidocaine was used for local anesthesia. A trocar needle was advanced into the subhepatic fluid collection with ultrasound guidance. Modified Seldinger technique was used to place a 10 French pigtail catheter drain. Immediate return of similar fluid to the surgical drain. We then proceeded with PTC. Ultrasound survey of the left liver lobe was performed, with then ultrasound of the right liver lobe. 1% lidocaine was used for local anesthesia, with generous infiltration of the skin and subcutaneous tissues in and inter left costal location. A Chiba needle was advanced under ultrasound guidance into the right liver lobe, using ultrasound guidance in attempt to access a biliary duct. We were successful in accessing portal vein, hepatic vein, and at 1 point distal hepatic artery. Given the small size of the biliary system/decompressed biliary system ultrasound was abandoned. We then proceeded with a formal PTC attempt. The access needle was advanced from a mid axillary line, eleventh rib into the a Paddock parenchyma. A traditional pullback contrast injection technique was used. It took 3 passes of the needle before distal biliary radicles were opacified. Intermittent opacification of the biliary system using this first needle position was possible in opacifying the more central biliary ducts. A second access was then used for access into the biliary system, from  intercostal location. This second needle was placed into the more central biliary ducts using fluoroscopic guidance with multiple obliquity. Once the tip of this needle was confirmed within the more central biliary system, an 018 wire was advanced centrally. The needle was removed, a small incision was made with an 11 blade scalpel, and then a triaxial Accustick system was advanced into the biliary system. The metal stiffener and dilator were removed, we confirmed placement with contrast infusion. A coaxial Glidewire and 4 French glide cath were then used to navigate across the obstruction at the ampulla into the duodenum. Once the catheter was within the duodenum, the wire was removed and contrast confirmed location. A coon's wire was advanced through the system, and the Accustick and Glidewire were removed. Dilation of the subcutaneous tissue tracks was performed with a 10 [place] dilator, then a 12 [place] dilator, and then a 39 [place] biliary drain was placed as an internal/external biliary drain. Small amount of contrast confirmed location. The patient tolerated the procedure well and remained hemodynamically stable throughout. No complications were encountered and no significant blood loss was encountered. IMPRESSION: Status post ultrasound-guided subhepatic abscess drainage, as well as image guided percutaneous transhepatic cholangiogram with internal/external drainage. Signed, Yvone Neu. Miachel Roux, RPVI [facility]  [facility] Radiology Electronically Signed   By: Gilmer Mor D.O.   On: 08/10/2023 15:41   IR BILIARY DRAIN PLACEMENT WITH CHOLANGIOGRAM Result Date: 08/10/2023 INDICATION: 73 year old male with a history of biloma and bile leak after prior partial cholecystectomy. He presents for drainage and internal/external biliary drain. EXAM: ULTRASOUND-GUIDED DRAINAGE OF SUBHEPATIC ABSCESS IMAGE GUIDED PERCUTANEOUS TRANSHEPATIC CHOLANGIOGRAM WITH  INTERNAL/EXTERNAL BILIARY DRAIN. MEDICATIONS: None ANESTHESIA/SEDATION: Moderate (conscious) sedation was employed during this procedure. A total of Versed 2.0 mg and Fentanyl 100 mcg, 1 mg Dilaudid, was administered intravenously by the radiology nurse. Total intra-service moderate Sedation Time: 82 minutes. The patient's level of consciousness and vital signs were monitored continuously by radiology nursing throughout the procedure under my direct supervision. FLUOROSCOPY: Radiation Exposure Index (as provided by the fluoroscopic device): 387 mGy Kerma COMPLICATIONS: None PROCEDURE: The procedure, risks, benefits, and alternatives were explained to the patient and the patient's family. A complete informed consent was performed, with risk benefit analysis. Specific risks that were discussed for the procedure include bleeding, infection, biliary sepsis, IC use day, organ injury, need for further procedure, need for further surgery, long-term drain placement, cardiopulmonary collapse, death. Questions regarding the procedure were encouraged and answered. The patient understands and consents to the procedure. Patient is position in supine position on the fluoroscopy table, and the upper abdomen was prepped and draped in the usual sterile fashion. Maximum barrier sterile technique with sterile gowns and gloves were used for the procedure. A timeout was performed prior to the initiation of the procedure. Local anesthesia was provided with 1% lidocaine with epinephrine. Ultrasound survey was performed of the hilum of the liver and the subhepatic liver in the right upper quadrant. Significant gas was present in the hilum of the liver, and differentiating what may be colon versus is a gas filled collection in this area was not possible. We elected to drain the sub a Paddock collection as this appears to communicate on the preoperative MRI. 1% lidocaine was used for local anesthesia. A trocar needle was advanced into the  subhepatic fluid collection with ultrasound guidance. Modified Seldinger technique was used to place a 10 French pigtail catheter drain. Immediate return of similar fluid to the surgical drain. We then proceeded with PTC. Ultrasound survey of the left liver lobe was performed, with then ultrasound of the right liver lobe. 1% lidocaine was used for local anesthesia, with generous infiltration of the skin and subcutaneous tissues in and inter left costal location. A Chiba needle was advanced under ultrasound guidance into the right liver lobe, using ultrasound guidance in attempt to access a biliary duct. We were successful in accessing portal vein, hepatic vein, and at 1 point distal hepatic artery. Given the small size of the biliary system/decompressed biliary system ultrasound was abandoned. We then proceeded with a formal PTC attempt. The access needle was advanced from a mid axillary line, eleventh rib into the a Paddock parenchyma. A traditional pullback contrast injection technique was used. It took 3 passes of the needle before distal biliary radicles were opacified. Intermittent opacification of the biliary system using this first needle position was possible in opacifying the more central biliary ducts. A second access was then used for access into the biliary system, from intercostal location. This second needle was placed into the more central biliary ducts using fluoroscopic guidance with multiple obliquity. Once the tip of this needle was confirmed within the more central biliary system, an 018 wire was advanced centrally. The needle was removed, a small incision  was made with an 11 blade scalpel, and then a triaxial Accustick system was advanced into the biliary system. The metal stiffener and dilator were removed, we confirmed placement with contrast infusion. A coaxial Glidewire and 4 French glide cath were then used to navigate across the obstruction at the ampulla into the duodenum. Once the  catheter was within the duodenum, the wire was removed and contrast confirmed location. A coon's wire was advanced through the system, and the Accustick and Glidewire were removed. Dilation of the subcutaneous tissue tracks was performed with a 10 [place] dilator, then a 12 [place] dilator, and then a 87 [place] biliary drain was placed as an internal/external biliary drain. Small amount of contrast confirmed location. The patient tolerated the procedure well and remained hemodynamically stable throughout. No complications were encountered and no significant blood loss was encountered. IMPRESSION: Status post ultrasound-guided subhepatic abscess drainage, as well as image guided percutaneous transhepatic cholangiogram with internal/external drainage. Signed, Yvone Neu. Miachel Roux, RPVI [facility] Radiology Electronically Signed   By: Gilmer Mor D.O.   On: 08/10/2023 15:41      Anti-infectives: Anti-infectives (From admission, onward)    Start     Dose/Rate Route Frequency Ordered Stop   08/09/23 1400  cefTRIAXone (ROCEPHIN) 2 g in sodium chloride 0.9 % 100 mL IVPB  Status:  Discontinued        2 g 200 mL/hr over 30 Minutes Intravenous To Radiology 08/09/23 1254 08/10/23 0716   08/09/23 1245  cefTRIAXone (ROCEPHIN) 2 g in sodium chloride 0.9 % 100 mL IVPB  Status:  Discontinued       Note to Pharmacy: Please do not give on floor. To be given in IR.   2 g 200 mL/hr over 30 Minutes Intravenous To Radiology 08/09/23 1152 08/09/23 1213   08/07/23 1100  piperacillin-tazobactam (ZOSYN) IVPB 3.375 g        3.375 g 12.5 mL/hr over 240 Minutes Intravenous Every 8 hours 08/07/23 1008     08/07/23 0000  piperacillin-tazobactam (ZOSYN) 3.375 GM/50ML IVPB        3.375 g Intravenous Every 8 hours 08/07/23 1501     07/29/23 0800  piperacillin-tazobactam (ZOSYN) IVPB 3.375 g        3.375 g 12.5 mL/hr over 240 Minutes Intravenous Every 8 hours 07/29/23 0207  08/06/23 2359   07/29/23 0030  piperacillin-tazobactam (ZOSYN) IVPB 3.375 g        3.375 g 100 mL/hr over 30 Minutes Intravenous  Once 07/29/23 0020 07/29/23 0147        Assessment/Plan POD 11, s/p subtotal cholecystectomy for gangrenous cholecystitis, Dr. Hillery Hunter 1/8 -regular diet -cont abx therapy.  WBC 14K today, AF -LFTs normal -drain output from both drains looks much better today.  Less odor and a bit more thin in nature today. - PTC working well.  Message sent to IR PA to start discussions of when we can internalize his PTC, hopefully early this week as this is working well and labs look great.   - I contacted [name], [facility], and [facility] yesterday on behalf of the patient and his request to transfer as well as need for possible transfer due to ERCP difficulties in the setting of bypass anatomy. -[name] declined due to capacity.  [facility] was reviewing his case and the patient then refused this location so request for transfer was withdrawn.  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  They also have no beds and unclear at what point that may change to even accept the patient.  Their recommendation was to have IR place a PTC drain so this stents his CBD and essentially accomplishes the same goal in a safe technique.   FEN -regular VTE - hold eliquis, heparin gtt ID - Zosyn, cont at this time given WBC still elevated and murky bilious leak  A fib AKI Peripheral neuropathy GERD HLD Hx of gastric bypass   LOS: 14 days    Francisco Padilla , [facility] 08/12/2023, 8:23 AM Please see Amion for pager number during day hours 7:00am-4:30pm or 7:00am -11:30am on weekends

## 2023-08-12 NOTE — Progress Notes (Signed)
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   Oxycodone Hcl     Hallucinations, agitation     Patient Measurements: Height: 6\' 4"  (193 cm) Weight: 99.5 kg (219 lb 4.8 oz) IBW/kg (Calculated) : 86.8 Heparin Dosing Weight: 93 kg  Vital Signs: Temp: 98.4 F (36.9 C) (01/19 1600) Temp Source: Oral (01/19 1600) BP: 80/55 (01/19 1600) Pulse Rate: 85 (01/19 1600)  Labs: Recent Labs    08/10/23 0430 08/11/23 0500 08/11/23 1320 08/12/23 0411 08/12/23 1630  HGB 9.9* 9.4*  --  9.5*  --   HCT 30.0* 28.7*  --  28.8*  --   PLT 365 337  --  364  --   HEPARINUNFRC  --   --  0.29* 0.23* 0.25*  CREATININE 0.57* 0.71  --  0.66  --     Estimated Creatinine Clearance: 104 mL/min (by C-G formula based on SCr of 0.66 mg/dL).  Assessment: 73 y.o. male . Patine wason Eliquis pta for PAF. Last dose given 1/3. Transitioned to IV Heparin which was stopped 1/8 AM for cholecystectomy. Held throughout post-operative day 0 (1/9) due to bleeding loss during surgery. Pharmacy consulted to resume IV Heparin  on 08/03/23 AM - no bolus. Pt continues on heparin s/p cholecystectomy 08/01/23. Patient transitioned back to apixaban 1/14 but patient with increased abdominal pain. Switched back to heparin for possible procedure. Apixaban last given 1/14 0818.   HL 0.25 - subtherapeutic on 3100 units/hr. No issues with infusion or line today per RN  Goal of Therapy:  Heparin level 0.3-0.7 units/mL aPTT 66-102 seconds Monitor platelets by anticoagulation protocol: Yes   Plan:  Increase heparin to 3200 units/h Check 8 hour heparin level Monitor daily heparin level, CBC, signs/symptoms of bleeding  F/u restart apix when no procedures  Thank you for  allowing pharmacy to be part of this patients care team.  Calton Dach, PharmD, BCCCP Clinical Pharmacist 08/12/2023 5:18 PM

## 2023-08-12 NOTE — Progress Notes (Signed)
PHARMACY - ANTICOAGULATION CONSULT NOTE  Pharmacy Consult for heparin Indication: atrial fibrillation  Labs: Recent Labs    08/09/23 0630 08/09/23 1459 08/10/23 0430 08/11/23 0500 08/11/23 1320 08/12/23 0411  HGB   < >  --  9.9* 9.4*  --  9.5*  HCT  --   --  30.0* 28.7*  --  28.8*  PLT  --   --  365 337  --  364  LABPROT  --  14.9  --   --   --   --   INR  --  1.1  --   --   --   --   HEPARINUNFRC  --   --   --   --  0.29* 0.23*  CREATININE  --   --  0.57* 0.71  --  0.66   < > = values in this interval not displayed.   Assessment: 72yo male subtherapeutic on heparin with lower level despite increased rate, likely d/t DOAC clearing; no infusion issues or signs of bleeding per RN.  Goal of Therapy:  Heparin level 0.3-0.7 units/ml   Plan:  Increase heparin infusion by 10% to 3100 units/hr. Check level in 6 hours.   Vernard Gambles, PharmD, BCPS 08/12/2023 6:21 AM

## 2023-08-13 DIAGNOSIS — I4891 Unspecified atrial fibrillation: Secondary | ICD-10-CM | POA: Diagnosis not present

## 2023-08-13 DIAGNOSIS — K810 Acute cholecystitis: Secondary | ICD-10-CM | POA: Diagnosis not present

## 2023-08-13 DIAGNOSIS — N179 Acute kidney failure, unspecified: Secondary | ICD-10-CM | POA: Diagnosis not present

## 2023-08-13 DIAGNOSIS — I350 Nonrheumatic aortic (valve) stenosis: Secondary | ICD-10-CM | POA: Diagnosis not present

## 2023-08-13 LAB — AEROBIC/ANAEROBIC CULTURE W GRAM STAIN (SURGICAL/DEEP WOUND)

## 2023-08-13 LAB — BASIC METABOLIC PANEL
Anion gap: 9 (ref 5–15)
BUN: 16 mg/dL (ref 8–23)
CO2: 25 mmol/L (ref 22–32)
Calcium: 7.9 mg/dL — ABNORMAL LOW (ref 8.9–10.3)
Chloride: 98 mmol/L (ref 98–111)
Creatinine, Ser: 0.72 mg/dL (ref 0.61–1.24)
GFR, Estimated: >60 mL/min (ref >60)
Glucose, Bld: 88 mg/dL (ref 70–99)
Potassium: 3.7 mmol/L (ref 3.5–5.1)
Sodium: 132 mmol/L — ABNORMAL LOW (ref 135–145)

## 2023-08-13 LAB — HEPARIN LEVEL (UNFRACTIONATED)
Heparin Unfractionated: >1.1 [IU]/mL — ABNORMAL HIGH (ref 0.30–0.70)
Heparin Unfractionated: 0.46 [IU]/mL (ref 0.30–0.70)
Heparin Unfractionated: 0.43 [IU]/mL (ref 0.30–0.70)

## 2023-08-13 LAB — CBC
HCT: 28.7 % — ABNORMAL LOW (ref 39.0–52.0)
Hemoglobin: 9.6 g/dL — ABNORMAL LOW (ref 13.0–17.0)
MCH: 31 pg (ref 26.0–34.0)
MCHC: 33.4 g/dL (ref 30.0–36.0)
MCV: 92.6 fL (ref 80.0–100.0)
Platelets: 327 10*3/uL (ref 150–400)
RBC: 3.1 MIL/uL — ABNORMAL LOW (ref 4.22–5.81)
RDW: 13.4 % (ref 11.5–15.5)
WBC: 13.1 10*3/uL — ABNORMAL HIGH (ref 4.0–10.5)
nRBC: 0 % (ref 0.0–0.2)

## 2023-08-13 MED ORDER — SODIUM CHLORIDE 0.9 % IV SOLN
1.0000 g | Freq: Three times a day (TID) | INTRAVENOUS | Status: DC
  Administered 2023-08-13 – 2023-08-21 (×26): 1 g via INTRAVENOUS
  Filled 2023-08-13 (×27): qty 20

## 2023-08-13 MED ORDER — APIXABAN 5 MG PO TABS
5.0000 mg | ORAL_TABLET | Freq: Two times a day (BID) | ORAL | Status: DC
  Administered 2023-08-13 – 2023-08-25 (×25): 5 mg via ORAL
  Filled 2023-08-13 (×25): qty 1

## 2023-08-13 NOTE — Progress Notes (Signed)
[facility]                                                                              Francisco Padilla, is a 73 y.o. male, DOB - 12-Jun-1951, URK:[id number] Admit date - 07/28/2023    Outpatient Primary MD for the patient is [name], MD  LOS - 15  days  Chief Complaint  Patient presents with   Weakness       Brief summary   Patient is a 73 year old male with pertinent PMH OSA on CPAP, HLD, chronic hypotension, PAF on Eliquis, PVD presents to [facility] ED on 1/5 with cholecystitis.   Patient complaining of weakness/fatigue going on for about a month.  Patient also having nausea, abdominal pain, diarrhea.  Abdominal pain worse in right upper quadrant.  Denies fever/chills.  Came to [facility] ED on 1/5 for further workup.  On arrival patient noted to be slightly tachycardic 100s and mildly hypotensive 93/73.  CT ABD/pelvis showing acute cholecystitis.  Surgery consulted.  Given IV fluids and Zosyn.  Awaiting surgery 48 hours for Eliquis washout. Cards consulted for preoperative evaluation of cholecystectomy. Echo on 1/7 showing lvef 60-65%; severe aortic stenosis.  On 1/8 patient taken to the OR for cholecystectomy.  While in the OR patient had tachycardia 150s and started on esmolol.  Patient later became hypotensive and started on neo.  Postop patient taken to ICU while on pressors.  PCCM consulted.   1/5 admitted with cholecystitis 1/8 s/p cholecystectomy; given esmolol for HR 150s then became hypotensive on neo-; postop taken to ICU and PCCM consulted 1/10 episode of rapid A-fib with RVR in the 150s, systolic pressure in the 70s. 83/15: no clinical signs of bile leak, and no need for ERCP.  01/14: worsening drain out put, suggesting resumption of bile leak. Plan for transfer to tertiary care.   1/17: Image guided drain into subhepatic abscess, PTC   Assessment & Plan    Principal Problem:   Acute cholecystitis Gangrenous cholecystitis, complicated with sepsis status  post subtotal cholecystectomy by Dr. Hillery Hunter on 1/8  -Postop ICU care due to tachycardia requiring esmolol and subsequent hypotension/shock requiring vasopressors, A-fib with RVR. -MRCP: undrained fluid collection that the surgical drain traverses but unable to completely evacuate -IR following, s/p image guided drain into the subhepatic abscess and PTC on 1/17 (Dr. Loreta Ave) -Management per surgery, IR -Cultures + E. coli, resistant to Zosyn, changed to IV meropenem today -Per IR, would not place metal stent for the bile leak and recommended to continue drain -No other interventions planned, will resume eliquis and DC heparin    Chronic hypotension -Initially placed on Solu-Cortef, stress dose steroids have been discontinued -B12, folate normal, a.m. cortisol also normal 15.0 -Had received IV albumin x 3 on 1/17 for severe hypoalbuminemia 1.7 -Received Lasix 20 mg p.o. x 1 on 1/19 due to mild volume overload, BNP 705.3 -Continue midodrine 10 mg 3 times daily, Florinef   Hypokalemia -Replace as needed    AKI (acute kidney injury) (HCC) -Creatinine stable  Hyponatremia -Mild hypo-Na, asymptomatic     Aortic stenosis, severe Stable, no signs of acute decompensation -Volume management  Atrial fibrillation with RVR (HCC) -Rate controlled -Continue midodrine for BP Resume Eliquis today and DC heparin   History of Roux-en-Y gastric bypass Follow up as outpatient.    OSA on CPAP Continue Home Cpap.    Pressure injury documentation Right buttocks, stage I, Not present on admission -Nursing care     Severe calorie malnutrition Nutrition Problem: Severe Malnutrition Etiology: chronic illness (gastric bypass) Signs/Symptoms: severe fat depletion, severe muscle depletion Interventions: Ensure Enlive (each supplement provides 350kcal and 20 grams of protein), MVI, Carnation Instant Breakfast  Estimated body mass index is 25.95 kg/m as calculated from the following:   Height  as of this encounter: 6\' 4"  (1.93 m).   Weight as of this encounter: 96.7 kg.  Code Status: Full code DVT Prophylaxis:  SCDs Start: 07/29/23 0131 Place TED hose Start: 07/29/23 0131   Level of Care: Level of care: Telemetry Cardiac Family Communication: Updated patient' Disposition Plan:      Remains inpatient appropriate: PT evaluation recommended SNF   Procedures:  08/01/2023 subtotal cholecystectomy (Dr Melody Haver, general surgery) 1/17: Image guided drain into subhepatic abscess, PTC  Consultants:   Cardiology General Surgery Critical care  Antimicrobials:   Anti-infectives (From admission, onward)    Start     Dose/Rate Route Frequency Ordered Stop   08/13/23 0800  meropenem (MERREM) 1 g in sodium chloride 0.9 % 100 mL IVPB        1 g 200 mL/hr over 30 Minutes Intravenous Every 8 hours 08/13/23 0714     08/09/23 1400  cefTRIAXone (ROCEPHIN) 2 g in sodium chloride 0.9 % 100 mL IVPB  Status:  Discontinued        2 g 200 mL/hr over 30 Minutes Intravenous To Radiology 08/09/23 1254 08/10/23 0716   08/09/23 1245  cefTRIAXone (ROCEPHIN) 2 g in sodium chloride 0.9 % 100 mL IVPB  Status:  Discontinued       Note to Pharmacy: Please do not give on floor. To be given in IR.   2 g 200 mL/hr over 30 Minutes Intravenous To Radiology 08/09/23 1152 08/09/23 1213   08/07/23 1100  piperacillin-tazobactam (ZOSYN) IVPB 3.375 g  Status:  Discontinued        3.375 g 12.5 mL/hr over 240 Minutes Intravenous Every 8 hours 08/07/23 1008 08/13/23 0714   08/07/23 0000  piperacillin-tazobactam (ZOSYN) 3.375 GM/50ML IVPB        3.375 g Intravenous Every 8 hours 08/07/23 1501     07/29/23 0800  piperacillin-tazobactam (ZOSYN) IVPB 3.375 g        3.375 g 12.5 mL/hr over 240 Minutes Intravenous Every 8 hours 07/29/23 0207 08/06/23 2359   07/29/23 0030  piperacillin-tazobactam (ZOSYN) IVPB 3.375 g        3.375 g 100 mL/hr over 30 Minutes Intravenous  Once 07/29/23 0020 07/29/23 0147           Medications  acetaminophen  1,000 mg Oral Q6H   Or   acetaminophen  650 mg Rectal Q6H   acidophilus  1 capsule Oral Daily   alfuzosin  10 mg Oral Q breakfast   calcium carbonate  200 mg of elemental calcium Oral TID   Chlorhexidine Gluconate Cloth  6 each Topical Daily   docusate sodium  100 mg Oral BID   feeding supplement  237 mL Oral BID BM   fludrocortisone  0.1 mg Oral Daily   gabapentin  600 mg Oral QHS   latanoprost  1 drop Both Eyes QHS  midodrine  10 mg Oral TID WC   multivitamin with minerals  1 tablet Oral BID   nutrition supplement (JUVEN)  1 packet Oral BID BM   pantoprazole  40 mg Oral Daily   polyethylene glycol  17 g Oral Daily   sodium chloride flush  3 mL Intravenous Q12H   sodium chloride flush  5 mL Intracatheter Q8H   venlafaxine XR  37.5 mg Oral Q breakfast   zolpidem  10 mg Oral QHS      Subjective:   Francisco Padilla was seen and examined today.  Feeling a lot better today, denies any specific complaints.  BP soft but asymptomatic.  No chest pain, shortness of breath or congestion today.  No chest pain, nausea or vomiting.  Tolerating diet.  Objective:   Vitals:   08/13/23 0400 08/13/23 0453 08/13/23 0815 08/13/23 1141  BP:  100/68 102/75 (!) 87/63  Pulse:  81 76 67  Resp: 15 15 17 19   Temp:  97.8 F (36.6 C) 98.3 F (36.8 C) 97.8 F (36.6 C)  TempSrc:  Oral Oral Oral  SpO2:  98% 98% 99%  Weight: 96.7 kg     Height:        Intake/Output Summary (Last 24 hours) at 08/13/2023 1321 Last data filed at 08/13/2023 1036 Gross per 24 hour  Intake 605 ml  Output 4635 ml  Net -4030 ml     Wt Readings from Last 3 Encounters:  08/13/23 96.7 kg  07/19/23 92.4 kg  06/14/23 96.9 kg   Physical Exam General: Alert and oriented x 3, NAD Cardiovascular: S1 S2 clear, RRR.  Respiratory: No wheezing, fairly CTAB. Gastrointestinal: Soft, 3 drains + Ext: no pedal edema bilaterally Neuro: no new deficits Psych: Normal affect,  pleasant      Data Reviewed:  I have personally reviewed following labs    CBC Lab Results  Component Value Date   WBC 13.1 (H) 08/13/2023   RBC 3.10 (L) 08/13/2023   HGB 9.6 (L) 08/13/2023   HCT 28.7 (L) 08/13/2023   MCV 92.6 08/13/2023   MCH 31.0 08/13/2023   PLT 327 08/13/2023   MCHC 33.4 08/13/2023   RDW 13.4 08/13/2023   LYMPHSABS 0.7 08/07/2023   MONOABS 0.9 08/07/2023   EOSABS 0.0 08/07/2023   BASOSABS 0.0 08/07/2023     Last metabolic panel Lab Results  Component Value Date   NA 132 (L) 08/13/2023   K 3.7 08/13/2023   CL 98 08/13/2023   CO2 25 08/13/2023   BUN 16 08/13/2023   CREATININE 0.72 08/13/2023   GLUCOSE 88 08/13/2023   GFRNONAA >60 08/13/2023   GFRAA  10/23/2009    >60        The eGFR has been calculated using the MDRD equation. This calculation has not been validated in all clinical situations. eGFR's persistently <60 mL/min signify possible Chronic Kidney Disease.   CALCIUM 7.9 (L) 08/13/2023   PROT 4.8 (L) 08/12/2023   ALBUMIN 2.2 (L) 08/12/2023   BILITOT 0.7 08/12/2023   ALKPHOS 48 08/12/2023   AST 14 (L) 08/12/2023   ALT 16 08/12/2023   ANIONGAP 9 08/13/2023    CBG (last 3)  No results for input(s): "GLUCAP" in the last 72 hours.     Coagulation Profile: Recent Labs  Lab 08/09/23 1459  INR 1.1      Radiology Studies: I have personally reviewed the imaging studies  DG CHEST PORT 1 VIEW Result Date: 08/12/2023 CLINICAL DATA:  Dyspnea. EXAM: PORTABLE CHEST  1 VIEW COMPARISON:  08/03/2023 FINDINGS: Right-sided PICC line unchanged. Lungs are hypoinflated with mild prominence of the perihilar vasculature suggesting a mild degree of vascular congestion. No lobar consolidation or effusion. Cardiomediastinal silhouette and remainder the exam is unchanged. IMPRESSION: Hypoinflation with mild vascular congestion. Electronically Signed   By: Elberta Fortis M.D.   On: 08/12/2023 12:47        Torre Schaumburg M.D. [facility] 08/13/2023, 1:21 PM  Available via Epic secure chat 7am-7pm After 7 pm, please refer to night coverage provider listed on amion.

## 2023-08-13 NOTE — Progress Notes (Signed)
Patient stated he was unable to wear mask provided by [facility] for nightly CPAP

## 2023-08-13 NOTE — Progress Notes (Signed)
Pt heparin gtt infusing through PICC. Flushed the line and switched heparin gtt to pts peripheral IV. Pt educated on the importance of not having the infusion through his PICC.

## 2023-08-13 NOTE — Progress Notes (Signed)
PHARMACY - ANTICOAGULATION Pharmacy Consult for Heparin Indication: atrial fibrillation (on Apixaban prior to admission> now on hold)  Patient Measurements: Height: 6\' 4"  (193 cm) Weight: 99.5 kg (219 lb 4.8 oz) IBW/kg (Calculated) : 86.8 Heparin Dosing Weight: 93 kg  Vital Signs: Temp: 98.5 F (36.9 C) (01/20 0035) Temp Source: Oral (01/20 0035) BP: 96/58 (01/20 0035) Pulse Rate: 76 (01/20 0035)  Labs: Recent Labs    08/10/23 0430 08/11/23 0500 08/11/23 1320 08/12/23 0411 08/12/23 1630 08/13/23 0010 08/13/23 0236  HGB 9.9* 9.4*  --  9.5*  --   --   --   HCT 30.0* 28.7*  --  28.8*  --   --   --   PLT 365 337  --  364  --   --   --   HEPARINUNFRC  --   --    < > 0.23* 0.25* >1.10* 0.46  CREATININE 0.57* 0.71  --  0.66  --   --   --    < > = values in this interval not displayed.    Estimated Creatinine Clearance: 104 mL/min (by C-G formula based on SCr of 0.66 mg/dL).  Assessment: 73 y.o. male . Patine wason Eliquis pta for PAF. Last dose given 1/3. Transitioned to IV Heparin which was stopped 1/8 AM for cholecystectomy. Held throughout post-operative day 0 (1/9) due to bleeding loss during surgery. Pharmacy consulted to resume IV Heparin  on 08/03/23 AM - no bolus. Pt continues on heparin s/p cholecystectomy 08/01/23. Patient transitioned back to apixaban 1/14 but patient with increased abdominal pain. Switched back to heparin for possible procedure. Apixaban last given 1/14 0818.   HL 0.46 - therapeutic on 3200 units/hr after redraw (drawn incorrectly and returned at >1.1). No issues with infusion or line reported. Last CBC shows Hgb 9.5 and plts 364  Goal of Therapy:  Heparin level 0.3-0.7 units/mL Monitor platelets by anticoagulation protocol: Yes   Plan:  Continue heparin at 3200 units/h Check 8 hour confirmatory heparin level Monitor daily heparin level, CBC, signs/symptoms of bleeding  F/u restart apix when no procedures  Thank you for allowing pharmacy to be  part of this patients care team.  Arabella Merles, PharmD. Clinical Pharmacist 08/13/2023 3:25 AM

## 2023-08-13 NOTE — TOC Progression Note (Signed)
Transition of Care [facility]) - Progression Note    Patient Details  Name: MANNY WOLLERT MRN: [id number] Date of Birth: 06/03/1951  Transition of Care [facility]) CM/SW Contact  Michaela Corner, [place] Phone Number: 08/13/2023, 2:39 PM  Clinical Narrative:   CSW spoke with Herbert Seta at [facility] admissions regarding pts referral. Herbert Seta stated she will need to call CSW back.     Expected Discharge Plan: Home w Home Health Services Barriers to Discharge: Continued Medical Work up  Expected Discharge Plan and Services In-house Referral: NA Discharge Planning Services: CM Consult Post Acute Care Choice: NA Living arrangements for the past 2 months: Single Family Home                 DME Arranged: N/A DME Agency: NA       HH Arranged: NA           Social Determinants of Health (SDOH) Interventions SDOH Screenings   Food Insecurity: No Food Insecurity (07/29/2023)  Housing: Low Risk  (07/29/2023)  Transportation Needs: No Transportation Needs (07/29/2023)  Utilities: Not At Risk (07/29/2023)  Social Connections: Patient Declined (07/31/2023)  Tobacco Use: Low Risk  (08/01/2023)    Readmission Risk Interventions     No data to display

## 2023-08-13 NOTE — Progress Notes (Addendum)
   IR Note  Follows with [facility] and bile leak after prior partial cholecystectomy.   IR procedure 1/17:  Status post ultrasound-guided subhepatic abscess drainage, as well as image guided percutaneous transhepatic cholangiogram with internal/external drainage  Jettie Pagan PA asking for possible Biliary drain internalization for this pt?  I discussed with Dr Fredia Sorrow:  Endolike stent, but has external component. Would not place metal stent for bile leak. This is the only thing he needs until bile leak resolves. We can eventually cap him, but could take a while for leak to heal.  CCS request capping  Capped drain per Dr Fredia Sorrow order Jettie Pagan aware and agreeable  Capped now Site is clean and dry NT no bleeding; no sign of infection If develops pain; N/V or fever--- will need to place back to drainage  Pt has good understanding of plan

## 2023-08-13 NOTE — Progress Notes (Signed)
PHARMACY - ANTICOAGULATION Pharmacy Consult for transition from heparin gtt >> apixaban Indication: atrial fibrillation   Patient Measurements: Height: 6\' 4"  (193 cm) Weight: 96.7 kg (213 lb 3 oz) IBW/kg (Calculated) : 86.8 Heparin Dosing Weight: 93 kg  Vital Signs: Temp: 97.8 F (36.6 C) (01/20 1141) Temp Source: Oral (01/20 1141) BP: 87/63 (01/20 1141) Pulse Rate: 67 (01/20 1141)  Labs: Recent Labs    08/11/23 0500 08/11/23 1320 08/12/23 0411 08/12/23 1630 08/13/23 0010 08/13/23 0236 08/13/23 0500  HGB 9.4*  --  9.5*  --   --   --  9.6*  HCT 28.7*  --  28.8*  --   --   --  28.7*  PLT 337  --  364  --   --   --  327  HEPARINUNFRC  --    < > 0.23* 0.25* >1.10* 0.46  --   CREATININE 0.71  --  0.66  --   --   --  0.72   < > = values in this interval not displayed.    Estimated Creatinine Clearance: 104 mL/min (by C-G formula based on SCr of 0.72 mg/dL).  Assessment: 73 y.o. male . Patine wason Eliquis pta for PAF. Last dose given 1/3. Transitioned to IV Heparin which was stopped 1/8 AM for cholecystectomy. Held throughout post-operative day 0 (1/9) due to bleeding loss during surgery. Pharmacy consulted to resume IV Heparin  on 08/03/23 AM - no bolus. Pt continues on heparin s/p cholecystectomy 08/01/23. Patient transitioned back to apixaban 1/14 but patient with increased abdominal pain. Switched back to heparin for possible procedure. Apixaban last given 1/14 0818.   HL 0.46 - therapeutic on 3200 units/hr after redraw (drawn incorrectly and returned at >1.1). No issues with infusion or line reported. Last CBC shows Hgb 9.5 and plts 364  Goal of Therapy:  Monitor platelets by anticoagulation protocol: Yes   Plan:  DC heparin infusion Start apixaban 5 mg po bid Monitor signs/symptoms of bleeding  Pharmacy will sign off consult but continue to monitor making recs prn  Thank you for allowing pharmacy to be part of this patients care team.  Greta Doom BS, PharmD,  BCPS Clinical Pharmacist 08/13/2023 1:34 PM  Contact: [phone] after 3 PM  "Be curious, not judgmental..." -Debbora Dus

## 2023-08-13 NOTE — Progress Notes (Signed)
Physical Therapy Treatment Patient Details Name: Francisco Padilla MRN: [id number] DOB: 1951-06-13 Today's Date: 08/13/2023   History of Present Illness Pt is a 73 y/o male admitted for sepsis due to acute cholecystitis and AKI. Gangrenous cholecystitis s/p lap subtotal cholecystectomy 1/8. MRCP 1/15 revealed an undrained fluid collection that the surgical drain traverses, but is unable to completely evacuate. IR placed per drain and PTC drain 1/17. PMH: OSA on CPAP, PAF, PVD, hypotension, HLD, GERD, BPH, Roux-en-Y gastric bypass ([name] 2008)    PT Comments  Pt pleasant and agreeable to PT session. Increased ambulation distance with RW and +2 assist for safety/equipment. Session limited by incontinence and extensive time dedicated to patient hygiene management. Patient will benefit from continued inpatient follow up therapy, <3 hours/day. Will continue to follow acutely and progress appropriately.    If plan is discharge home, recommend the following: Assistance with cooking/housework;Direct supervision/assist for medications management;Direct supervision/assist for financial management;Assist for transportation;Supervision due to cognitive status;Help with stairs or ramp for entrance;A lot of help with walking and/or transfers;A lot of help with bathing/dressing/bathroom   Can travel by private vehicle     No  Equipment Recommendations  Rolling walker (2 wheels);BSC/3in1    Recommendations for Other Services       Precautions / Restrictions Precautions Precautions: Fall Precaution Comments: 2 drains on R. Chronically low BP. Restrictions Weight Bearing Restrictions Per Provider Order: No     Mobility  Bed Mobility Overal bed mobility: Needs Assistance Bed Mobility: Supine to Sit     Supine to sit: Contact guard, HOB elevated, Used rails Sit to supine:  (Did not observe this session, pt left in recliner chair.)        Transfers Overall transfer level: Needs assistance Equipment  used: Rolling walker (2 wheels) Transfers: Sit to/from Stand, Bed to chair/wheelchair/BSC Sit to Stand: Min assist, From elevated surface, +2 safety/equipment   Step pivot transfers: Min assist, +2 safety/equipment       General transfer comment: Pt required VC for sequencing. Pt demonstrated ease  with releasing RW and reaching back towards the surface he was going to.    Ambulation/Gait Ambulation/Gait assistance: +2 safety/equipment, Min assist Gait Distance (Feet): 85 Feet (1x80 from EOB into hallway and back to [facility] 1x5 from [facility] to recliner chair) Assistive device: Rolling walker (2 wheels) Gait Pattern/deviations: Step-through pattern, Decreased step length - left, Decreased step length - right, Trunk flexed, Decreased weight shift to left   Gait velocity interpretation: <1.31 ft/sec, indicative of household ambulator   General Gait Details: Pt demonstrated excessive lean towards R with ambulating due to pain. Unable to further ambulation due to incontinence. Swiftly returned pt to room to use BSC.   Stairs             Wheelchair Mobility     Tilt Bed    Modified Rankin (Stroke Patients Only)       Balance Overall balance assessment: Needs assistance Sitting-balance support: Feet supported, Single extremity supported, Bilateral upper extremity supported Sitting balance-Leahy Scale: Fair Sitting balance - Comments: Pt off weighting L side leaning excessively to the R and posterior while seated EOB and in recliner chair due to discomfort. Postural control: Posterior lean, Right lateral lean Standing balance support: Bilateral upper extremity supported, During functional activity Standing balance-Leahy Scale: Poor                              Cognition Arousal: Alert Behavior  During Therapy: WFL for tasks assessed/performed Overall Cognitive Status: Within Functional Limits for tasks assessed                                 General  Comments: Pt oriented to self, date, location, and situation. Tangential conversation.        Exercises      General Comments General comments (skin integrity, edema, etc.): VSS: BP sitting on EOB 90/59 (69), BP standing 84/53 (64), BP sitting in chair 98/56 (70). Pt denied dizziness/lightheadedness wtih ambulation.      Pertinent Vitals/Pain Pain Assessment Pain Assessment: 0-10 Pain Score: 5  Faces Pain Scale: Hurts little more Pain Location: Abdomen Pain Descriptors / Indicators: Aching, Discomfort, Sore Pain Intervention(s): Monitored during session    Home Living                          Prior Function            PT Goals (current goals can now be found in the care plan section) Acute Rehab PT Goals Patient Stated Goal: Be able to walk further PT Goal Formulation: With patient Time For Goal Achievement: 08/16/23 Potential to Achieve Goals: Good Progress towards PT goals: Progressing toward goals    Frequency    Min 1X/week      PT Plan      Co-evaluation              AM-PAC PT "6 Clicks" Mobility   Outcome Measure  Help needed turning from your back to your side while in a flat bed without using bedrails?: A Little Help needed moving from lying on your back to sitting on the side of a flat bed without using bedrails?: A Little Help needed moving to and from a bed to a chair (including a wheelchair)?: A Little Help needed standing up from a chair using your arms (e.g., wheelchair or bedside chair)?: A Little Help needed to walk in hospital room?: A Lot Help needed climbing 3-5 steps with a railing? : A Lot 6 Click Score: 16    End of Session Equipment Utilized During Treatment: Gait belt Activity Tolerance: Other (comment) (Treatment limited secondary to incontinence and patient hygiene.) Patient left: in chair;with chair alarm set;with call bell/phone within reach Nurse Communication: Mobility status PT Visit Diagnosis: Other  abnormalities of gait and mobility (R26.89);Muscle weakness (generalized) (M62.81);Unsteadiness on feet (R26.81);Difficulty in walking, not elsewhere classified (R26.2)     Time: 4098-1191 PT Time Calculation (min) (ACUTE ONLY): 36 min  Charges:    $Therapeutic Activity: 23-37 mins                     Cheri Guppy, PT, DPT Acute Rehabilitation Services Office: [phone] Secure Chat Preferred  Richardson Chiquito 08/13/2023, 3:55 PM

## 2023-08-13 NOTE — Progress Notes (Signed)
12 Days Post-Op  Subjective: Patient feeling better with medication adjustments yesterday.  Pain better controlled.  Still eating well.  In a good place today.  Objective: Vital signs in last 24 hours: Temp:  [97.8 F (36.6 C)-98.5 F (36.9 C)] 98.3 F (36.8 C) (01/20 0815) Pulse Rate:  [76-88] 76 (01/20 0815) Resp:  [12-17] 17 (01/20 0815) BP: (80-102)/(55-75) 102/75 (01/20 0815) SpO2:  [98 %-99 %] 98 % (01/20 0815) Weight:  [96.7 kg] 96.7 kg (01/20 0400) Last BM Date : 08/12/23  Intake/Output from previous day: 01/19 0701 - 01/20 0700 In: 965 [P.O.:960] Out: 4092.5 [Urine:3600; Drains:492.5] Intake/Output this shift: Total I/O In: -  Out: 1250 [Urine:1000; Drains:250]  PE: Gen: NAD Abd: soft, 3 drains present.  Surgical drain with minimal tan purulent, but much thinner output - 15cc, IR drain with purulent output, 2.5cc, about 5 cc present currently. PTC with 475cc of thin bilious, slightly blood tinged today.  Bag is about full currently.  Lab Results:  Recent Labs    08/12/23 0411 08/13/23 0500  WBC 14.5* 13.1*  HGB 9.5* 9.6*  HCT 28.8* 28.7*  PLT 364 327   BMET Recent Labs    08/12/23 0411 08/13/23 0500  NA 134* 132*  K 4.3 3.7  CL 100 98  CO2 29 25  GLUCOSE 87 88  BUN 17 16  CREATININE 0.66 0.72  CALCIUM 8.1* 7.9*   PT/INR No results for input(s): "LABPROT", "INR" in the last 72 hours.  CMP     Component Value Date/Time   NA 132 (L) 08/13/2023 0500   K 3.7 08/13/2023 0500   CL 98 08/13/2023 0500   CO2 25 08/13/2023 0500   GLUCOSE 88 08/13/2023 0500   BUN 16 08/13/2023 0500   CREATININE 0.72 08/13/2023 0500   CREATININE 0.77 07/19/2023 1128   CALCIUM 7.9 (L) 08/13/2023 0500   PROT 4.8 (L) 08/12/2023 0411   ALBUMIN 2.2 (L) 08/12/2023 0411   AST 14 (L) 08/12/2023 0411   ALT 16 08/12/2023 0411   ALKPHOS 48 08/12/2023 0411   BILITOT 0.7 08/12/2023 0411   GFRNONAA >60 08/13/2023 0500   GFRAA  10/23/2009 0135    >60        The eGFR  has been calculated using the MDRD equation. This calculation has not been validated in all clinical situations. eGFR's persistently <60 mL/min signify possible Chronic Kidney Disease.   Lipase     Component Value Date/Time   LIPASE 29 07/29/2023 0014       Studies/Results: DG CHEST PORT 1 VIEW Result Date: 08/12/2023 CLINICAL DATA:  Dyspnea. EXAM: PORTABLE CHEST 1 VIEW COMPARISON:  08/03/2023 FINDINGS: Right-sided PICC line unchanged. Lungs are hypoinflated with mild prominence of the perihilar vasculature suggesting a mild degree of vascular congestion. No lobar consolidation or effusion. Cardiomediastinal silhouette and remainder the exam is unchanged. IMPRESSION: Hypoinflation with mild vascular congestion. Electronically Signed   By: Elberta Fortis M.D.   On: 08/12/2023 12:47      Anti-infectives: Anti-infectives (From admission, onward)    Start     Dose/Rate Route Frequency Ordered Stop   08/13/23 0800  meropenem (MERREM) 1 g in sodium chloride 0.9 % 100 mL IVPB        1 g 200 mL/hr over 30 Minutes Intravenous Every 8 hours 08/13/23 0714     08/09/23 1400  cefTRIAXone (ROCEPHIN) 2 g in sodium chloride 0.9 % 100 mL IVPB  Status:  Discontinued  2 g 200 mL/hr over 30 Minutes Intravenous To Radiology 08/09/23 1254 08/10/23 0716   08/09/23 1245  cefTRIAXone (ROCEPHIN) 2 g in sodium chloride 0.9 % 100 mL IVPB  Status:  Discontinued       Note to Pharmacy: Please do not give on floor. To be given in IR.   2 g 200 mL/hr over 30 Minutes Intravenous To Radiology 08/09/23 1152 08/09/23 1213   08/07/23 1100  piperacillin-tazobactam (ZOSYN) IVPB 3.375 g  Status:  Discontinued        3.375 g 12.5 mL/hr over 240 Minutes Intravenous Every 8 hours 08/07/23 1008 08/13/23 0714   08/07/23 0000  piperacillin-tazobactam (ZOSYN) 3.375 GM/50ML IVPB        3.375 g Intravenous Every 8 hours 08/07/23 1501     07/29/23 0800  piperacillin-tazobactam (ZOSYN) IVPB 3.375 g        3.375  g 12.5 mL/hr over 240 Minutes Intravenous Every 8 hours 07/29/23 0207 08/06/23 2359   07/29/23 0030  piperacillin-tazobactam (ZOSYN) IVPB 3.375 g        3.375 g 100 mL/hr over 30 Minutes Intravenous  Once 07/29/23 0020 07/29/23 0147        Assessment/Plan POD 12, s/p subtotal cholecystectomy for gangrenous cholecystitis, Dr. Hillery Hunter 1/8 -regular diet -cont abx therapy, but switch to Merrem as E coli in cultures is resistant to zosyn.  WBC 13K today, AF -LFTs normal yesterday, will recheck in am after PTC capped today.  D/w IR -drain output from both drains appears purulent in nature, but output is weaning significantly.  Continue to monitor. -can likely stop heparin gtt and transition back to Eliquis given no further plans for intervention for now. -d/w primary team   - I contacted [name], [facility], and [facility] yesterday on behalf of the patient and his request to transfer as well as need for possible transfer due to ERCP difficulties in the setting of bypass anatomy. -[name] declined due to capacity.  [facility] was reviewing his case and the patient then refused this location so request for transfer was withdrawn.  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  They also have no beds and unclear at what point that may change to even accept the patient.  Their recommendation was to have IR place a PTC drain so this stents his CBD and essentially accomplishes the same goal in a safe technique.   FEN -regular VTE - can resume eliquis from surgical standpoint ID - Zosyn, changed to merrem on 1/20  A fib AKI Peripheral neuropathy GERD HLD Hx of gastric bypass   LOS: 15 days    Letha Cape , [facility] Surgery 08/13/2023, 11:04 AM Please see Amion for pager number during day hours 7:00am-4:30pm or 7:00am -11:30am on weekends

## 2023-08-13 NOTE — Plan of Care (Signed)
  Problem: Education: Goal: Knowledge of General Education information will improve Description: Including pain rating scale, medication(s)/side effects and non-pharmacologic comfort measures Outcome: Progressing   Problem: Clinical Measurements: Goal: Diagnostic test results will improve Outcome: Progressing Goal: Respiratory complications will improve Outcome: Progressing Goal: Cardiovascular complication will be avoided Outcome: Progressing   Problem: Activity: Goal: Risk for activity intolerance will decrease Outcome: Progressing   

## 2023-08-14 ENCOUNTER — Inpatient Hospital Stay (HOSPITAL_COMMUNITY): Payer: Medicare Other

## 2023-08-14 DIAGNOSIS — I350 Nonrheumatic aortic (valve) stenosis: Secondary | ICD-10-CM | POA: Diagnosis not present

## 2023-08-14 DIAGNOSIS — N179 Acute kidney failure, unspecified: Secondary | ICD-10-CM | POA: Diagnosis not present

## 2023-08-14 DIAGNOSIS — I4891 Unspecified atrial fibrillation: Secondary | ICD-10-CM | POA: Diagnosis not present

## 2023-08-14 DIAGNOSIS — K810 Acute cholecystitis: Secondary | ICD-10-CM | POA: Diagnosis not present

## 2023-08-14 LAB — CBC
HCT: 28.7 % — ABNORMAL LOW (ref 39.0–52.0)
Hemoglobin: 9.6 g/dL — ABNORMAL LOW (ref 13.0–17.0)
MCH: 31.1 pg (ref 26.0–34.0)
MCHC: 33.4 g/dL (ref 30.0–36.0)
MCV: 92.9 fL (ref 80.0–100.0)
Platelets: 315 10*3/uL (ref 150–400)
RBC: 3.09 MIL/uL — ABNORMAL LOW (ref 4.22–5.81)
RDW: 13.6 % (ref 11.5–15.5)
WBC: 12.9 10*3/uL — ABNORMAL HIGH (ref 4.0–10.5)
nRBC: 0 % (ref 0.0–0.2)

## 2023-08-14 LAB — COMPREHENSIVE METABOLIC PANEL
ALT: 16 U/L (ref 0–44)
AST: 16 U/L (ref 15–41)
Albumin: 2.1 g/dL — ABNORMAL LOW (ref 3.5–5.0)
Alkaline Phosphatase: 81 U/L (ref 38–126)
Anion gap: 5 (ref 5–15)
BUN: 19 mg/dL (ref 8–23)
CO2: 27 mmol/L (ref 22–32)
Calcium: 8.3 mg/dL — ABNORMAL LOW (ref 8.9–10.3)
Chloride: 101 mmol/L (ref 98–111)
Creatinine, Ser: 0.62 mg/dL (ref 0.61–1.24)
GFR, Estimated: >60 mL/min (ref >60)
Glucose, Bld: 80 mg/dL (ref 70–99)
Potassium: 3.7 mmol/L (ref 3.5–5.1)
Sodium: 133 mmol/L — ABNORMAL LOW (ref 135–145)
Total Bilirubin: 0.8 mg/dL (ref 0.0–1.2)
Total Protein: 5.2 g/dL — ABNORMAL LOW (ref 6.5–8.1)

## 2023-08-14 MED ORDER — MIDODRINE HCL 5 MG PO TABS
15.0000 mg | ORAL_TABLET | Freq: Three times a day (TID) | ORAL | Status: DC
  Administered 2023-08-14 – 2023-08-25 (×35): 15 mg via ORAL
  Filled 2023-08-14 (×35): qty 3

## 2023-08-14 MED ORDER — HYDROMORPHONE HCL 1 MG/ML IJ SOLN
1.0000 mg | Freq: Once | INTRAMUSCULAR | Status: AC
Start: 2023-08-14 — End: 2023-08-14
  Administered 2023-08-14: 1 mg via INTRAVENOUS
  Filled 2023-08-14: qty 1

## 2023-08-14 NOTE — Progress Notes (Signed)
13 Days Post-Op  Subjective: Slept well overnight.  Doing well with PTC capped.  No increase in pain or other symptoms.  Eating well  Objective: Vital signs in last 24 hours: Temp:  [97.8 F (36.6 C)-98.8 F (37.1 C)] 98.2 F (36.8 C) (01/21 0751) Pulse Rate:  [67-98] 98 (01/21 0751) Resp:  [10-20] 17 (01/21 0751) BP: (81-106)/(50-73) 81/50 (01/21 0751) SpO2:  [95 %-100 %] 100 % (01/21 0751) Weight:  [97 kg] 97 kg (01/21 0511) Last BM Date : 08/12/23  Intake/Output from previous day: 01/20 0701 - 01/21 0700 In: 1531 [P.O.:356; I.V.:1075; IV Piggyback:100] Out: 1882 [Urine:1600; Drains:282] Intake/Output this shift: Total I/O In: 420 [P.O.:120; IV Piggyback:300] Out: -   PE: Gen: NAD Abd: soft, incisions c/d/I, PTC capped, IR perc drain with minimal to no output in bulb.  Surgical drain with 5cc maybe of thinner purulent fluid.  32cc noted from yesterday  Lab Results:  Recent Labs    08/13/23 0500 08/14/23 0540  WBC 13.1* 12.9*  HGB 9.6* 9.6*  HCT 28.7* 28.7*  PLT 327 315   BMET Recent Labs    08/13/23 0500 08/14/23 0540  NA 132* 133*  K 3.7 3.7  CL 98 101  CO2 25 27  GLUCOSE 88 80  BUN 16 19  CREATININE 0.72 0.62  CALCIUM 7.9* 8.3*   PT/INR No results for input(s): "LABPROT", "INR" in the last 72 hours.  CMP     Component Value Date/Time   NA 133 (L) 08/14/2023 0540   K 3.7 08/14/2023 0540   CL 101 08/14/2023 0540   CO2 27 08/14/2023 0540   GLUCOSE 80 08/14/2023 0540   BUN 19 08/14/2023 0540   CREATININE 0.62 08/14/2023 0540   CREATININE 0.77 07/19/2023 1128   CALCIUM 8.3 (L) 08/14/2023 0540   PROT 5.2 (L) 08/14/2023 0540   ALBUMIN 2.1 (L) 08/14/2023 0540   AST 16 08/14/2023 0540   ALT 16 08/14/2023 0540   ALKPHOS 81 08/14/2023 0540   BILITOT 0.8 08/14/2023 0540   GFRNONAA >60 08/14/2023 0540   GFRAA  10/23/2009 0135    >60        The eGFR has been calculated using the MDRD equation. This calculation has not been validated in all  clinical situations. eGFR's persistently <60 mL/min signify possible Chronic Kidney Disease.   Lipase     Component Value Date/Time   LIPASE 29 07/29/2023 0014       Studies/Results: DG CHEST PORT 1 VIEW Result Date: 08/12/2023 CLINICAL DATA:  Dyspnea. EXAM: PORTABLE CHEST 1 VIEW COMPARISON:  08/03/2023 FINDINGS: Right-sided PICC line unchanged. Lungs are hypoinflated with mild prominence of the perihilar vasculature suggesting a mild degree of vascular congestion. No lobar consolidation or effusion. Cardiomediastinal silhouette and remainder the exam is unchanged. IMPRESSION: Hypoinflation with mild vascular congestion. Electronically Signed   By: Elberta Fortis M.D.   On: 08/12/2023 12:47      Anti-infectives: Anti-infectives (From admission, onward)    Start     Dose/Rate Route Frequency Ordered Stop   08/13/23 0800  meropenem (MERREM) 1 g in sodium chloride 0.9 % 100 mL IVPB        1 g 200 mL/hr over 30 Minutes Intravenous Every 8 hours 08/13/23 0714     08/09/23 1400  cefTRIAXone (ROCEPHIN) 2 g in sodium chloride 0.9 % 100 mL IVPB  Status:  Discontinued        2 g 200 mL/hr over 30 Minutes Intravenous To Radiology 08/09/23 1254  08/10/23 0716   08/09/23 1245  cefTRIAXone (ROCEPHIN) 2 g in sodium chloride 0.9 % 100 mL IVPB  Status:  Discontinued       Note to Pharmacy: Please do not give on floor. To be given in IR.   2 g 200 mL/hr over 30 Minutes Intravenous To Radiology 08/09/23 1152 08/09/23 1213   08/07/23 1100  piperacillin-tazobactam (ZOSYN) IVPB 3.375 g  Status:  Discontinued        3.375 g 12.5 mL/hr over 240 Minutes Intravenous Every 8 hours 08/07/23 1008 08/13/23 0714   08/07/23 0000  piperacillin-tazobactam (ZOSYN) 3.375 GM/50ML IVPB        3.375 g Intravenous Every 8 hours 08/07/23 1501     07/29/23 0800  piperacillin-tazobactam (ZOSYN) IVPB 3.375 g        3.375 g 12.5 mL/hr over 240 Minutes Intravenous Every 8 hours 07/29/23 0207 08/06/23 2359   07/29/23  0030  piperacillin-tazobactam (ZOSYN) IVPB 3.375 g        3.375 g 100 mL/hr over 30 Minutes Intravenous  Once 07/29/23 0020 07/29/23 0147        Assessment/Plan POD 13, s/p subtotal cholecystectomy for gangrenous cholecystitis, Dr. Hillery Hunter 1/8 -regular diet -cont abx therapy, but switch to Merrem as E coli in cultures is resistant to zosyn.  WBC 12.9K today, AF.  Will d/w MD duration of abx coverage as he has already completed 12 days post op of abx and drains in place controlling fluid collection. -LFTs normal today after capping of his PTC -repeat CT likely in a week or so with IR as outpatient to follow up on fluid collection and intra-abdominal perc drain. -Eliquis restarted -surgically stable to pursue dispo plans. -will follow up with Dr. Hillery Hunter and IR as outpatient.   - I contacted [name], [facility], and [facility] yesterday on behalf of the patient and his request to transfer as well as need for possible transfer due to ERCP difficulties in the setting of bypass anatomy. -[name] declined due to capacity.  [facility] was reviewing his case and the patient then refused this location so request for transfer was withdrawn.  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  They also have no beds and unclear at what point that may change to even accept the patient.  Their recommendation was to have IR place a PTC drain so this stents his CBD and essentially accomplishes the same goal in a safe technique.   FEN -regular VTE - Eliquis ID - Zosyn, changed to merrem on 1/20  A fib AKI Peripheral neuropathy GERD HLD Hx of gastric bypass   LOS: 16 days    Letha Cape , [facility] Surgery 08/14/2023, 8:54 AM Please see Amion for pager number during day hours 7:00am-4:30pm or 7:00am -11:30am on weekends

## 2023-08-14 NOTE — Progress Notes (Addendum)
[facility]                                                                              Dayron Dietert, is a 73 y.o. male, DOB - [birth year], NWG:[id number] Admit date - 07/28/2023    Outpatient Primary MD for the patient is [name], MD  LOS - 16  days  Chief Complaint  Patient presents with   Weakness       Brief summary   Patient is a 73 year old male with pertinent PMH OSA on CPAP, HLD, chronic hypotension, PAF on Eliquis, PVD presents to [facility] ED on 1/5 with cholecystitis.   Patient complaining of weakness/fatigue going on for about a month.  Patient also having nausea, abdominal pain, diarrhea.  Abdominal pain worse in right upper quadrant.  Denies fever/chills.  Came to [facility] ED on 1/5 for further workup.  On arrival patient noted to be slightly tachycardic 100s and mildly hypotensive 93/73.  CT ABD/pelvis showing acute cholecystitis.  Surgery consulted.  Given IV fluids and Zosyn.  Awaiting surgery 48 hours for Eliquis washout. Cards consulted for preoperative evaluation of cholecystectomy. Echo on 1/7 showing lvef 60-65%; severe aortic stenosis.  On 1/8 patient taken to the OR for cholecystectomy.  While in the OR patient had tachycardia 150s and started on esmolol.  Patient later became hypotensive and started on neo.  Postop patient taken to ICU while on pressors.  PCCM consulted.   1/5 admitted with cholecystitis 1/8 s/p cholecystectomy; given esmolol for HR 150s then became hypotensive on neo-; postop taken to ICU and PCCM consulted 1/10 episode of rapid A-fib with RVR in the 150s, systolic pressure in the 70s. 57/84: no clinical signs of bile leak, and no need for ERCP.  01/14: worsening drain out put, suggesting resumption of bile leak. Plan for transfer to tertiary care.   1/17: Image guided drain into subhepatic abscess, PTC   Assessment & Plan    Principal Problem:   Acute cholecystitis Gangrenous cholecystitis, complicated with sepsis status  post subtotal cholecystectomy by Dr. Hillery Hunter on 1/8  -Postop ICU care due to tachycardia requiring esmolol and subsequent hypotension/shock requiring vasopressors, A-fib with RVR. -MRCP: undrained fluid collection that the surgical drain traverses but unable to completely evacuate -IR following, s/p image guided drain into the subhepatic abscess and PTC on 1/17 (Dr. Loreta Ave) -Cultures + E. coli, resistant to Zosyn, changed to IV meropenem on 08/13/2023 -Per IR, would not place metal stent for the bile leak and recommended to continue drain.  PTC capped by IR on 1/20, LFTs normal. -No other interventions planned, eliquis resumed 1/20 -Surgery, IR following   Chronic hypotension -Initially placed on Solu-Cortef, stress dose steroids have been discontinued, patient has been chronically on Florinef for hypotension. -B12, folate normal, a.m. cortisol also normal 15.0 -Received IV albumin x 3 on 1/17 for severe hypoalbuminemia 1.7 -Received Lasix 20 mg p.o. x 1 on 1/19 due to mild volume overload, BNP 705.3 -This morning noted BP in 80s, increase midodrine to 15 mg 3 times daily, continue Florinef  Hypokalemia -Replace as needed    AKI (acute kidney injury) (HCC) -Creatinine stable  Hyponatremia -  Improving     Aortic stenosis, severe Stable, no signs of acute decompensation   Atrial fibrillation with RVR (HCC) -Rate controlled -Continue midodrine for BP -Continue eliquis   History of Roux-en-Y gastric bypass Follow up as outpatient.    OSA on CPAP Continue Home Cpap.    Pressure injury documentation Right buttocks, stage I, Not present on admission -Nursing care     Severe calorie malnutrition Nutrition Problem: Severe Malnutrition Etiology: chronic illness (gastric bypass) Signs/Symptoms: severe fat depletion, severe muscle depletion Interventions: Ensure Enlive (each supplement provides 350kcal and 20 grams of protein), MVI, Carnation Instant Breakfast  Estimated body  mass index is 26.03 kg/m as calculated from the following:   Height as of this encounter: 6\' 4"  (1.93 m).   Weight as of this encounter: 97 kg.  Code Status: Full code DVT Prophylaxis:  SCDs Start: 07/29/23 0131 Place TED hose Start: 07/29/23 0131 apixaban (ELIQUIS) tablet 5 mg   Level of Care: Level of care: Telemetry Cardiac Family Communication: Updated patient' Disposition Plan:      Remains inpatient appropriate: PT evaluation recommended SNF, TOC assisting   Procedures:  08/01/2023 subtotal cholecystectomy (Dr Melody Haver, general surgery) 1/17: Image guided drain into subhepatic abscess, PTC  Consultants:   Cardiology General Surgery Critical care  Antimicrobials:   Anti-infectives (From admission, onward)    Start     Dose/Rate Route Frequency Ordered Stop   08/13/23 0800  meropenem (MERREM) 1 g in sodium chloride 0.9 % 100 mL IVPB        1 g 200 mL/hr over 30 Minutes Intravenous Every 8 hours 08/13/23 0714     08/09/23 1400  cefTRIAXone (ROCEPHIN) 2 g in sodium chloride 0.9 % 100 mL IVPB  Status:  Discontinued        2 g 200 mL/hr over 30 Minutes Intravenous To Radiology 08/09/23 1254 08/10/23 0716   08/09/23 1245  cefTRIAXone (ROCEPHIN) 2 g in sodium chloride 0.9 % 100 mL IVPB  Status:  Discontinued       Note to Pharmacy: Please do not give on floor. To be given in IR.   2 g 200 mL/hr over 30 Minutes Intravenous To Radiology 08/09/23 1152 08/09/23 1213   08/07/23 1100  piperacillin-tazobactam (ZOSYN) IVPB 3.375 g  Status:  Discontinued        3.375 g 12.5 mL/hr over 240 Minutes Intravenous Every 8 hours 08/07/23 1008 08/13/23 0714   08/07/23 0000  piperacillin-tazobactam (ZOSYN) 3.375 GM/50ML IVPB        3.375 g Intravenous Every 8 hours 08/07/23 1501     07/29/23 0800  piperacillin-tazobactam (ZOSYN) IVPB 3.375 g        3.375 g 12.5 mL/hr over 240 Minutes Intravenous Every 8 hours 07/29/23 0207 08/06/23 2359   07/29/23 0030  piperacillin-tazobactam (ZOSYN)  IVPB 3.375 g        3.375 g 100 mL/hr over 30 Minutes Intravenous  Once 07/29/23 0020 07/29/23 0147          Medications  acetaminophen  1,000 mg Oral Q6H   Or   acetaminophen  650 mg Rectal Q6H   acidophilus  1 capsule Oral Daily   alfuzosin  10 mg Oral Q breakfast   apixaban  5 mg Oral BID   calcium carbonate  200 mg of elemental calcium Oral TID   Chlorhexidine Gluconate Cloth  6 each Topical Daily   docusate sodium  100 mg Oral BID   feeding supplement  237 mL Oral BID BM  fludrocortisone  0.1 mg Oral Daily   gabapentin  600 mg Oral QHS   latanoprost  1 drop Both Eyes QHS   midodrine  15 mg Oral TID WC   multivitamin with minerals  1 tablet Oral BID   nutrition supplement (JUVEN)  1 packet Oral BID BM   pantoprazole  40 mg Oral Daily   polyethylene glycol  17 g Oral Daily   sodium chloride flush  3 mL Intravenous Q12H   sodium chloride flush  5 mL Intracatheter Q8H   venlafaxine XR  37.5 mg Oral Q breakfast   zolpidem  10 mg Oral QHS      Subjective:   Araceli Bouche was seen and examined today.  Borderline BP this morning, asymptomatic.  No chest pain, shortness of breath, worsening abdominal pain nausea or vomiting.  No fevers.  PTC capped.  Tolerating diet.   Objective:   Vitals:   08/14/23 0511 08/14/23 0748 08/14/23 0751 08/14/23 1146  BP:  (!) 83/50 (!) 81/50 94/74  Pulse:  97 98 86  Resp:  17 17 19   Temp:   98.2 F (36.8 C) 98.3 F (36.8 C)  TempSrc:   Oral Oral  SpO2:  100% 100% 99%  Weight: 97 kg     Height:        Intake/Output Summary (Last 24 hours) at 08/14/2023 1226 Last data filed at 08/14/2023 0839 Gross per 24 hour  Intake 1950.98 ml  Output 632 ml  Net 1318.98 ml     Wt Readings from Last 3 Encounters:  08/14/23 97 kg  07/19/23 92.4 kg  06/14/23 96.9 kg    Physical Exam General: Alert and oriented x 3, NAD Cardiovascular: S1 S2 clear, RRR.  Respiratory: CTAB, no wheezing, rales or rhonchi Gastrointestinal: Soft, ND, NBS,  PTC capped, 2 drains + Ext: no pedal edema bilaterally Neuro: no new deficits Psych: Normal affect       Data Reviewed:  I have personally reviewed following labs    CBC Lab Results  Component Value Date   WBC 12.9 (H) 08/14/2023   RBC 3.09 (L) 08/14/2023   HGB 9.6 (L) 08/14/2023   HCT 28.7 (L) 08/14/2023   MCV 92.9 08/14/2023   MCH 31.1 08/14/2023   PLT 315 08/14/2023   MCHC 33.4 08/14/2023   RDW 13.6 08/14/2023   LYMPHSABS 0.7 08/07/2023   MONOABS 0.9 08/07/2023   EOSABS 0.0 08/07/2023   BASOSABS 0.0 08/07/2023     Last metabolic panel Lab Results  Component Value Date   NA 133 (L) 08/14/2023   K 3.7 08/14/2023   CL 101 08/14/2023   CO2 27 08/14/2023   BUN 19 08/14/2023   CREATININE 0.62 08/14/2023   GLUCOSE 80 08/14/2023   GFRNONAA >60 08/14/2023   GFRAA  10/23/2009    >60        The eGFR has been calculated using the MDRD equation. This calculation has not been validated in all clinical situations. eGFR's persistently <60 mL/min signify possible Chronic Kidney Disease.   CALCIUM 8.3 (L) 08/14/2023   PROT 5.2 (L) 08/14/2023   ALBUMIN 2.1 (L) 08/14/2023   BILITOT 0.8 08/14/2023   ALKPHOS 81 08/14/2023   AST 16 08/14/2023   ALT 16 08/14/2023   ANIONGAP 5 08/14/2023    CBG (last 3)  No results for input(s): "GLUCAP" in the last 72 hours.     Coagulation Profile: Recent Labs  Lab 08/09/23 1459  INR 1.1      Radiology Studies:  I have personally reviewed the imaging studies  No results found.       Thad Ranger M.D. [facility] 08/14/2023, 12:26 PM  Available via Epic secure chat 7am-7pm After 7 pm, please refer to night coverage provider listed on amion.

## 2023-08-14 NOTE — TOC Progression Note (Addendum)
Transition of Care [facility]) - Progression Note    Patient Details  Name: Francisco Padilla MRN: [id number] Date of Birth: 1951/03/24  Transition of [facility] CM/SW Contact  Michaela Corner, [place] Phone Number: 08/14/2023, 12:15 PM  Clinical Narrative:   CSW spoke with pt and he confirmed that he is still open to going to SNF for short term rehab. Pts preferences are [facility] as first choice, [facility] as second choice. CSW reached out to both facilities and is awaiting a response from admissions coordinators.   2:21PM: CSW left VM for Josh w/ admissions at [facility] and [place] with admissions at [facility].   Expected Discharge Plan: Home w Home Health Services Barriers to Discharge: Continued Medical Work up  Expected Discharge Plan and Services In-house Referral: NA Discharge Planning Services: CM Consult Post Acute Care Choice: NA Living arrangements for the past 2 months: Single Family Home                 DME Arranged: N/A DME Agency: NA       HH Arranged: NA           Social Determinants of Health (SDOH) Interventions SDOH Screenings   Food Insecurity: No Food Insecurity (07/29/2023)  Housing: Low Risk  (07/29/2023)  Transportation Needs: No Transportation Needs (07/29/2023)  Utilities: Not At Risk (07/29/2023)  Social Connections: Patient Declined (07/31/2023)  Tobacco Use: Low Risk  (08/01/2023)    Readmission Risk Interventions     No data to display

## 2023-08-14 NOTE — Progress Notes (Addendum)
Occupational Therapy Treatment Patient Details Name: Francisco Padilla MRN: [id number] DOB: 04/30/1951 Today's Date: 08/14/2023   History of present illness Pt is a 73 y/o male admitted for sepsis due to acute cholecystitis and AKI. Gangrenous cholecystitis s/p lap subtotal cholecystectomy 1/8. MRCP 1/15 revealed an undrained fluid collection that the surgical drain traverses, but is unable to completely evacuate. IR placed per drain and PTC drain 1/17. 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 Pt able to mobilize in hallway using RW with Min A x 2 (chair follow) though interference noted with bowel incontinence. On return to bathroom, pt with major LOB and required Max A to correct to prevent fall. Pt required frequent safety cueing due to impulsivity and hands on assist for all ADLs required for cleanup after BM. Recommend continued inpatient follow up therapy, <3 hours/day at DC.      If plan is discharge home, recommend the following:  Assistance with cooking/housework;A lot of help with walking and/or transfers;A lot of help with bathing/dressing/bathroom;Assist for transportation   Equipment Recommendations  Other (comment) (RW)    Recommendations for Other Services      Precautions / Restrictions Precautions Precautions: Fall Precaution Comments: 2 drains on R. Chronically low BP. Restrictions Weight Bearing Restrictions Per Provider Order: No       Mobility Bed Mobility Overal bed mobility: Needs Assistance Bed Mobility: Supine to Sit, Sit to Supine     Supine to sit: Contact guard Sit to supine: Supervision   General bed mobility comments: difficulty gaining balance EOB with CGA and cues to correct    Transfers Overall transfer level: Needs assistance Equipment used: Rolling walker (2 wheels) Transfers: Sit to/from Stand, Bed to  chair/wheelchair/BSC Sit to Stand: Min assist, From elevated surface, +2 safety/equipment                 Balance Overall balance assessment: Needs assistance Sitting-balance support: Feet supported, Single extremity supported, Bilateral upper extremity supported Sitting balance-Leahy Scale: Poor Sitting balance - Comments: posterior bias with CGA and cues needed to correct Postural control: Posterior lean Standing balance support: Bilateral upper extremity supported, During functional activity Standing balance-Leahy Scale: Poor                             ADL either performed or assessed with clinical judgement   ADL Overall ADL's : Needs assistance/impaired     Grooming: Set up;Sitting;Wash/dry hands               Lower Body Dressing: Maximal assistance;Sitting/lateral leans;Sit to/from stand Lower Body Dressing Details (indicate cue type and reason): for sock mgmt due to bowel incontinence/soilage Toilet Transfer: Minimal assistance;Ambulation;Rolling walker (2 wheels);Regular Toilet;Grab bars Toilet Transfer Details (indicate cue type and reason): Min A for RW in and out of bathroom. Min A to stand from very low toilet given pt's tall stature and using grab bars. With initial attempt to sit on toilet, pt falling forward into shower area with Max A needed to catch and correct LOB to prevent fall. Toileting- Clothing Manipulation and Hygiene: Maximal assistance;Sit to/from stand;Sitting/lateral lean Toileting - Clothing Manipulation Details (indicate cue type and reason): for hygiene in standing     Functional mobility during ADLs: Minimal assistance;Contact guard assist;Rolling walker (2 wheels) General ADL Comments: Pt initially only wanted to  stand EOB but then progressed to wanting to walk in hallway. Pt with bowel incontinence while in hallway though despite cues to sit on pad to roll back to room, pt opted to continue walking requiring assistance to clean  floor from rehab tech while OT assisted pt to bathroom.    Extremity/Trunk Assessment Upper Extremity Assessment Upper Extremity Assessment: Generalized weakness;Right hand dominant   Lower Extremity Assessment Lower Extremity Assessment: Defer to PT evaluation        Vision   Vision Assessment?: No apparent visual deficits   Perception     Praxis      Cognition Arousal: Alert Behavior During Therapy: WFL for tasks assessed/performed Overall Cognitive Status: Impaired/Different from baseline Area of Impairment: Safety/judgement                         Safety/Judgement: Decreased awareness of safety     General Comments: pleasant, tangential. does need cueing for safety d/t high fall risk        Exercises      Shoulder Instructions       General Comments      Pertinent Vitals/ Pain       Pain Assessment Pain Assessment: Faces Faces Pain Scale: Hurts a little bit Pain Location: Abdomen Pain Descriptors / Indicators: Sore Pain Intervention(s): Monitored during session  Home Living                                          Prior Functioning/Environment              Frequency  Min 1X/week        Progress Toward Goals  OT Goals(current goals can now be found in the care plan section)  Progress towards OT goals: OT to reassess next treatment  Acute Rehab OT Goals Patient Stated Goal: go to rehab soon OT Goal Formulation: With patient Time For Goal Achievement: 08/28/23 Potential to Achieve Goals: Good  Plan      Co-evaluation                 AM-PAC OT "6 Clicks" Daily Activity     Outcome Measure   Help from another person eating meals?: None Help from another person taking care of personal grooming?: A Little Help from another person toileting, which includes using toliet, bedpan, or urinal?: A Lot Help from another person bathing (including washing, rinsing, drying)?: A Lot Help from another person to  put on and taking off regular upper body clothing?: A Little Help from another person to put on and taking off regular lower body clothing?: A Lot 6 Click Score: 16    End of Session Equipment Utilized During Treatment: Gait belt;Rolling walker (2 wheels)  OT Visit Diagnosis: Unsteadiness on feet (R26.81);Other abnormalities of gait and mobility (R26.89);Muscle weakness (generalized) (M62.81)   Activity Tolerance Patient tolerated treatment well   Patient Left in bed;with call bell/phone within reach;with bed alarm set   Nurse Communication Mobility status        Time: 1610-9604 OT Time Calculation (min): 49 min  Charges: OT General Charges $OT Visit: 1 Visit OT Treatments $Self Care/Home Management : 23-37 mins $Therapeutic Activity: 8-22 mins  Bradd Canary, OTR/L Acute Rehab Services Office: [phone]   Lorre Munroe 08/14/2023, 12:07 PM

## 2023-08-15 ENCOUNTER — Inpatient Hospital Stay (HOSPITAL_COMMUNITY): Payer: Medicare Other

## 2023-08-15 DIAGNOSIS — N179 Acute kidney failure, unspecified: Secondary | ICD-10-CM | POA: Diagnosis not present

## 2023-08-15 DIAGNOSIS — K810 Acute cholecystitis: Secondary | ICD-10-CM | POA: Diagnosis not present

## 2023-08-15 DIAGNOSIS — I350 Nonrheumatic aortic (valve) stenosis: Secondary | ICD-10-CM | POA: Diagnosis not present

## 2023-08-15 DIAGNOSIS — I4891 Unspecified atrial fibrillation: Secondary | ICD-10-CM | POA: Diagnosis not present

## 2023-08-15 HISTORY — PX: IR CATHETER TUBE CHANGE: IMG717

## 2023-08-15 HISTORY — PX: IR US GUIDE BX ASP/DRAIN: IMG2392

## 2023-08-15 HISTORY — PX: IR EXCHANGE BILIARY DRAIN: IMG6046

## 2023-08-15 LAB — COMPREHENSIVE METABOLIC PANEL
ALT: 26 U/L (ref 0–44)
AST: 26 U/L (ref 15–41)
Albumin: 2 g/dL — ABNORMAL LOW (ref 3.5–5.0)
Alkaline Phosphatase: 164 U/L — ABNORMAL HIGH (ref 38–126)
Anion gap: 7 (ref 5–15)
BUN: 21 mg/dL (ref 8–23)
CO2: 26 mmol/L (ref 22–32)
Calcium: 8.6 mg/dL — ABNORMAL LOW (ref 8.9–10.3)
Chloride: 101 mmol/L (ref 98–111)
Creatinine, Ser: 0.73 mg/dL (ref 0.61–1.24)
GFR, Estimated: >60 mL/min (ref >60)
Glucose, Bld: 167 mg/dL — ABNORMAL HIGH (ref 70–99)
Potassium: 4.2 mmol/L (ref 3.5–5.1)
Sodium: 134 mmol/L — ABNORMAL LOW (ref 135–145)
Total Bilirubin: 0.9 mg/dL (ref 0.0–1.2)
Total Protein: 4.9 g/dL — ABNORMAL LOW (ref 6.5–8.1)

## 2023-08-15 LAB — CBC
HCT: 30.3 % — ABNORMAL LOW (ref 39.0–52.0)
Hemoglobin: 10 g/dL — ABNORMAL LOW (ref 13.0–17.0)
MCH: 30.9 pg (ref 26.0–34.0)
MCHC: 33 g/dL (ref 30.0–36.0)
MCV: 93.5 fL (ref 80.0–100.0)
Platelets: 302 10*3/uL (ref 150–400)
RBC: 3.24 MIL/uL — ABNORMAL LOW (ref 4.22–5.81)
RDW: 13.6 % (ref 11.5–15.5)
WBC: 27.3 10*3/uL — ABNORMAL HIGH (ref 4.0–10.5)
nRBC: 0 % (ref 0.0–0.2)

## 2023-08-15 MED ORDER — PANTOPRAZOLE SODIUM 40 MG PO TBEC
40.0000 mg | DELAYED_RELEASE_TABLET | Freq: Two times a day (BID) | ORAL | Status: DC
  Administered 2023-08-15 – 2023-08-25 (×20): 40 mg via ORAL
  Filled 2023-08-15 (×21): qty 1

## 2023-08-15 MED ORDER — LIDOCAINE HCL 1 % IJ SOLN
20.0000 mL | Freq: Once | INTRAMUSCULAR | Status: AC
  Administered 2023-08-15: 10 mL via INTRADERMAL
  Filled 2023-08-15: qty 20

## 2023-08-15 MED ORDER — IOHEXOL 300 MG/ML  SOLN
50.0000 mL | Freq: Once | INTRAMUSCULAR | Status: AC | PRN
  Administered 2023-08-15: 25 mL

## 2023-08-15 MED ORDER — SODIUM CHLORIDE 0.9 % IV SOLN
INTRAVENOUS | Status: AC
  Filled 2023-08-15: qty 20

## 2023-08-15 MED ORDER — LIDOCAINE-EPINEPHRINE 1 %-1:100000 IJ SOLN
INTRAMUSCULAR | Status: AC
  Filled 2023-08-15: qty 1

## 2023-08-15 MED ORDER — IOHEXOL 350 MG/ML SOLN
75.0000 mL | Freq: Once | INTRAVENOUS | Status: AC | PRN
  Administered 2023-08-15: 75 mL via INTRAVENOUS

## 2023-08-15 MED ORDER — CEFTRIAXONE SODIUM 2 G IJ SOLR
2.0000 g | INTRAMUSCULAR | Status: AC
  Administered 2023-08-15: 2 g via INTRAVENOUS
  Filled 2023-08-15: qty 20

## 2023-08-15 MED ORDER — IOHEXOL 300 MG/ML  SOLN: 50.0000 mL | Freq: Once | INTRAMUSCULAR | Status: DC | PRN

## 2023-08-15 NOTE — Procedures (Signed)
Interventional Radiology Procedure Note  Procedure:  1) Biliary drain exchange 2) Perihepatic fluid collection aspiration  Findings: Please refer to procedural dictation for full description.  Indwelling 12 Fr biliary drain retracted with side hole at liver capsule.  Injection demonstrates leakage into perihepatic space.  Drain exchanged, appropriately positioned. To bag drainage.  US guided aspiration of perihepatic collection with phelgmonous appearance sonographically.  Trace thick, sanguinobilious fluid aspirated.  Complications: None immediate  Estimated Blood Loss: < 5 mL  Recommendations: Keep to bag drainage for now.  Could consider repeat attempt at capping/internalization in 24 hrs. Perihepatic collection likely represents bilious contents from drain malposition.  Expect to resolve spontaneously, too phlegmonous for drain placement. IR will follow.   Marliss Coots, MD

## 2023-08-15 NOTE — Progress Notes (Signed)
[facility]                                                                              Francisco Padilla, is a 73 y.o. male, DOB - 01-14-1951, GEX:[id number] Admit date - 07/28/2023    Outpatient Primary MD for the patient is [name], MD  LOS - 17  days  Chief Complaint  Patient presents with   Weakness       Brief summary   Patient is a 73 year old male with pertinent PMH OSA on CPAP, HLD, chronic hypotension, PAF on Eliquis, PVD presents to [facility] ED on 1/5 with cholecystitis.   Patient complaining of weakness/fatigue going on for about a month.  Patient also having nausea, abdominal pain, diarrhea.  Abdominal pain worse in right upper quadrant.  Denies fever/chills.  Came to [facility] ED on 1/5 for further workup.  On arrival patient noted to be slightly tachycardic 100s and mildly hypotensive 93/73.  CT ABD/pelvis showing acute cholecystitis.  Surgery consulted.  Given IV fluids and Zosyn.  Awaiting surgery 48 hours for Eliquis washout. Cards consulted for preoperative evaluation of cholecystectomy. Echo on 1/7 showing lvef 60-65%; severe aortic stenosis.  On 1/8 patient taken to the OR for cholecystectomy.  While in the OR patient had tachycardia 150s and started on esmolol.  Patient later became hypotensive and started on neo.  Postop patient taken to ICU while on pressors.  PCCM consulted.   1/5 admitted with cholecystitis 1/8 s/p cholecystectomy; given esmolol for HR 150s then became hypotensive on neo-; postop taken to ICU and PCCM consulted 1/10 episode of rapid A-fib with RVR in the 150s, systolic pressure in the 70s. 01/02: no clinical signs of bile leak, and no need for ERCP.  01/14: worsening drain out put, suggesting resumption of bile leak. Plan for transfer to tertiary care.   1/17: Image guided drain into subhepatic abscess, PTC 1/20: PTC capped by IR, LFTs normal 1/22: Right-sided abdominal pain, reconnected PTC, CT abdomen   Assessment & Plan     Principal Problem:   Acute cholecystitis Gangrenous cholecystitis, complicated with sepsis status post subtotal cholecystectomy by Dr. Hillery Hunter on 1/8  -Postop ICU care due to tachycardia requiring esmolol and subsequent hypotension/shock requiring vasopressors, A-fib with RVR. -MRCP: undrained fluid collection that the surgical drain traverses but unable to completely evacuate -IR following, s/p image guided drain into the subhepatic abscess and PTC on 1/17 (Dr. Loreta Ave) -Cultures + E. coli, resistant to Zosyn, changed to IV meropenem on 08/13/2023 -Per IR, would not place metal stent for the bile leak and recommended to continue drain.  PTC initially capped by IR on 1/20.  However on 1/21, patient had right-sided abdominal pain/shoulder pain.  -WBCs, alk phos noted to be elevated today, reconnected PTC -Follow CT abdomen  -Eliquis was resumed on 1/20 anticipating no further procedures -Surgery, IR following   Chronic hypotension -Initially placed on Solu-Cortef, stress dose steroids have been discontinued, patient has been chronically on Florinef for hypotension. -B12, folate normal, a.m. cortisol also normal 15.0 -Received IV albumin x 3 on 1/17 for severe hypoalbuminemia 1.7 -Received Lasix 20 mg p.o. x 1 on  1/19 due to mild volume overload, BNP 705.3 -BP still borderline, continue midodrine 15 mg 3 times daily, Florinef  -Follow B1    AKI (acute kidney injury) (HCC) -Creatinine stable  Hyponatremia -Improving     Aortic stenosis, severe Stable, no signs of acute decompensation   Atrial fibrillation with RVR (HCC) -Rate controlled -Continue midodrine for BP -Continue eliquis   History of Roux-en-Y gastric bypass Follow up as outpatient.    OSA on CPAP Continue Home Cpap.    Pressure injury documentation Right buttocks, stage I, Not present on admission -Nursing care  Right shoulder pain -Likely referred from right sided abdomen.  Shoulder x-ray showed  osteoarthritis     Severe calorie malnutrition Nutrition Problem: Severe Malnutrition Etiology: chronic illness (gastric bypass) Signs/Symptoms: severe fat depletion, severe muscle depletion Interventions: Ensure Enlive (each supplement provides 350kcal and 20 grams of protein), MVI, Carnation Instant Breakfast  Estimated body mass index is 26.03 kg/m as calculated from the following:   Height as of this encounter: 6\' 4"  (1.93 m).   Weight as of this encounter: 97 kg.  Code Status: Full code DVT Prophylaxis:  SCDs Start: 07/29/23 0131 Place TED hose Start: 07/29/23 0131 apixaban (ELIQUIS) tablet 5 mg   Level of Care: Level of care: Telemetry Cardiac Family Communication: Updated patient' Disposition Plan:      Remains inpatient appropriate: PT evaluation recommended SNF, TOC assisting   Procedures:  08/01/2023 subtotal cholecystectomy (Dr Melody Haver, general surgery) 1/17: Image guided drain into subhepatic abscess, PTC  Consultants:   Cardiology General Surgery Critical care  Antimicrobials:   Anti-infectives (From admission, onward)    Start     Dose/Rate Route Frequency Ordered Stop   08/13/23 0800  meropenem (MERREM) 1 g in sodium chloride 0.9 % 100 mL IVPB        1 g 200 mL/hr over 30 Minutes Intravenous Every 8 hours 08/13/23 0714     08/09/23 1400  cefTRIAXone (ROCEPHIN) 2 g in sodium chloride 0.9 % 100 mL IVPB  Status:  Discontinued        2 g 200 mL/hr over 30 Minutes Intravenous To Radiology 08/09/23 1254 08/10/23 0716   08/09/23 1245  cefTRIAXone (ROCEPHIN) 2 g in sodium chloride 0.9 % 100 mL IVPB  Status:  Discontinued       Note to Pharmacy: Please do not give on floor. To be given in IR.   2 g 200 mL/hr over 30 Minutes Intravenous To Radiology 08/09/23 1152 08/09/23 1213   08/07/23 1100  piperacillin-tazobactam (ZOSYN) IVPB 3.375 g  Status:  Discontinued        3.375 g 12.5 mL/hr over 240 Minutes Intravenous Every 8 hours 08/07/23 1008 08/13/23 0714    08/07/23 0000  piperacillin-tazobactam (ZOSYN) 3.375 GM/50ML IVPB        3.375 g Intravenous Every 8 hours 08/07/23 1501     07/29/23 0800  piperacillin-tazobactam (ZOSYN) IVPB 3.375 g        3.375 g 12.5 mL/hr over 240 Minutes Intravenous Every 8 hours 07/29/23 0207 08/06/23 2359   07/29/23 0030  piperacillin-tazobactam (ZOSYN) IVPB 3.375 g        3.375 g 100 mL/hr over 30 Minutes Intravenous  Once 07/29/23 0020 07/29/23 0147          Medications  acetaminophen  1,000 mg Oral Q6H   Or   acetaminophen  650 mg Rectal Q6H   acidophilus  1 capsule Oral Daily   alfuzosin  10 mg Oral Q breakfast  apixaban  5 mg Oral BID   calcium carbonate  200 mg of elemental calcium Oral TID   Chlorhexidine Gluconate Cloth  6 each Topical Daily   docusate sodium  100 mg Oral BID   feeding supplement  237 mL Oral BID BM   fludrocortisone  0.1 mg Oral Daily   gabapentin  600 mg Oral QHS   latanoprost  1 drop Both Eyes QHS   midodrine  15 mg Oral TID WC   multivitamin with minerals  1 tablet Oral BID   nutrition supplement (JUVEN)  1 packet Oral BID BM   pantoprazole  40 mg Oral Daily   polyethylene glycol  17 g Oral Daily   sodium chloride flush  3 mL Intravenous Q12H   sodium chloride flush  5 mL Intracatheter Q8H   venlafaxine XR  37.5 mg Oral Q breakfast   zolpidem  10 mg Oral QHS      Subjective:   Araceli Bouche was seen and examined today.  Had right shoulder pain yesterday, now acute shoulder pain today.  No nausea vomiting or worsening abdominal pain.  No fevers.  Objective:   Vitals:   08/15/23 0411 08/15/23 0539 08/15/23 0755 08/15/23 1118  BP: 91/63  90/73 (!) 87/61  Pulse:   86 87  Resp: 19  18 14   Temp: 98.3 F (36.8 C)  98.2 F (36.8 C) 98.2 F (36.8 C)  TempSrc: Oral  Oral Oral  SpO2:   95% 96%  Weight:  97 kg    Height:        Intake/Output Summary (Last 24 hours) at 08/15/2023 1438 Last data filed at 08/15/2023 0416 Gross per 24 hour  Intake --  Output 780  ml  Net -780 ml     Wt Readings from Last 3 Encounters:  08/15/23 97 kg  07/19/23 92.4 kg  06/14/23 96.9 kg   Physical Exam General: Alert and oriented x 3, NAD Cardiovascular: S1 S2 clear, RRR.  Respiratory: CTAB,  Gastrointestinal: Soft, no acute TTP  3 drains.  Ext: no pedal edema bilaterally Neuro: no new deficits Psych: Normal affect     Data Reviewed:  I have personally reviewed following labs    CBC Lab Results  Component Value Date   WBC 27.3 (H) 08/15/2023   RBC 3.24 (L) 08/15/2023   HGB 10.0 (L) 08/15/2023   HCT 30.3 (L) 08/15/2023   MCV 93.5 08/15/2023   MCH 30.9 08/15/2023   PLT 302 08/15/2023   MCHC 33.0 08/15/2023   RDW 13.6 08/15/2023   LYMPHSABS 0.7 08/07/2023   MONOABS 0.9 08/07/2023   EOSABS 0.0 08/07/2023   BASOSABS 0.0 08/07/2023     Last metabolic panel Lab Results  Component Value Date   NA 134 (L) 08/15/2023   K 4.2 08/15/2023   CL 101 08/15/2023   CO2 26 08/15/2023   BUN 21 08/15/2023   CREATININE 0.73 08/15/2023   GLUCOSE 167 (H) 08/15/2023   GFRNONAA >60 08/15/2023   GFRAA  10/23/2009    >60        The eGFR has been calculated using the MDRD equation. This calculation has not been validated in all clinical situations. eGFR's persistently <60 mL/min signify possible Chronic Kidney Disease.   CALCIUM 8.6 (L) 08/15/2023   PROT 4.9 (L) 08/15/2023   ALBUMIN 2.0 (L) 08/15/2023   BILITOT 0.9 08/15/2023   ALKPHOS 164 (H) 08/15/2023   AST 26 08/15/2023   ALT 26 08/15/2023   ANIONGAP 7 08/15/2023  CBG (last 3)  No results for input(s): "GLUCAP" in the last 72 hours.     Coagulation Profile: Recent Labs  Lab 08/09/23 1459  INR 1.1      Radiology Studies: I have personally reviewed the imaging studies  DG Shoulder Right Result Date: 08/14/2023 CLINICAL DATA:  Right shoulder pain for several days EXAM: RIGHT SHOULDER - 2+ VIEW COMPARISON:  None Available. FINDINGS: Internal rotation, external rotation,  transscapular, and axillary views of the right shoulder are obtained on 5 images. No acute fracture, subluxation, or dislocation. Mild hypertrophic changes of the acromioclavicular joint. Soft tissues are unremarkable. Continued vascular congestion within the visualized right hemithorax. Right-sided PICC partially visualized. IMPRESSION: 1. Acromioclavicular joint osteoarthritis. 2. No acute bony abnormality. Electronically Signed   By: Sharlet Salina M.D.   On: 08/14/2023 17:25         Kylle Lall M.D. [facility] 08/15/2023, 2:38 PM  Available via Epic secure chat 7am-7pm After 7 pm, please refer to night coverage provider listed on amion.

## 2023-08-15 NOTE — Progress Notes (Signed)
Physical Therapy Treatment Patient Details Name: Francisco Padilla MRN: [id number] DOB: 19-Jan-1951 Today's Date: 08/15/2023   History of Present Illness Pt is a 73 y/o male admitted for sepsis due to acute cholecystitis and AKI. Gangrenous cholecystitis s/p lap subtotal cholecystectomy 1/8. MRCP 1/15 revealed an undrained fluid collection that the surgical drain traverses, but is unable to completely evacuate. IR placed per drain and PTC drain 1/17. 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 Re-direction and re-orientation to task were utilized to increase pt's safety awareness. Pt continues to demonstrate posterior lean while seated EOB and intermittently left lateral lean to avoid R side pain able to correct with VC. Increased gait distance using RW with CGA and chair follow. Session cut short due to arrival of transport for pt to be taken off floor. Pt met 0/4 goals due to BP issues, incontinence, and requiring +2 for safety/equipment. Goals were revised and updated. Pt will benefit from acute skilled PT to increase his independence and safety with mobility to allow discharge.       If plan is discharge home, recommend the following: Assistance with cooking/housework;Direct supervision/assist for medications management;Direct supervision/assist for financial management;Assist for transportation;Supervision due to cognitive status;Help with stairs or ramp for entrance;A lot of help with walking and/or transfers;A lot of help with bathing/dressing/bathroom   Can travel by private vehicle     No  Equipment Recommendations       Recommendations for Other Services       Precautions / Restrictions Precautions Precautions: Fall Precaution Comments: 2 JP drains on R. Chronically low BP. Restrictions Weight Bearing Restrictions Per Provider  Order: No     Mobility  Bed Mobility Overal bed mobility: Needs Assistance Bed Mobility: Supine to Sit, Sit to Supine     Supine to sit: Supervision Sit to supine: Supervision   General bed mobility comments: HOB slightly elevated. Pt required assistance managed lines/leads.    Transfers Overall transfer level: Needs assistance Equipment used: Rolling walker (2 wheels) Transfers: Sit to/from Stand Sit to Stand: Contact guard assist           General transfer comment: Pt demonstrate appropriate sequencing and use of RW for STS from EOB.    Ambulation/Gait Ambulation/Gait assistance: Contact guard assist, +2 safety/equipment Gait Distance (Feet): 150 Feet Assistive device: Rolling walker (2 wheels) Gait Pattern/deviations: Decreased step length - left, Decreased step length - right, Trunk flexed   Gait velocity interpretation: <1.8 ft/sec, indicate of risk for recurrent falls   General Gait Details: Pt's gait was inconsistent as he would randomly begin dancing; therefore, varying his step length and BOS. Cues given to focus pt on controlled gait pattern.  Pt maintained body inside RW, but was positioned closer to the R side. Unable to further gait distance d/t arrival of transport.   Stairs             Wheelchair Mobility     Tilt Bed    Modified Rankin (Stroke Patients Only)       Balance Overall balance assessment: Needs assistance Sitting-balance support: Feet supported, Bilateral upper extremity supported Sitting balance-Leahy Scale: Poor Sitting balance - Comments: Pt biased posterior and slightly left, c/o pain on R side. Able to correct to neutral posture with cueing. Postural control: Posterior lean Standing balance support: Bilateral upper extremity supported, During functional activity, [place]  on assistive device for balance Standing balance-Leahy Scale: Poor Standing balance comment: Pt uses RW for OOB mobility. Pt released one grip of RW to  wave at person in hallway LOB sideway, corrected with minA. Educated pt to maintain BUE in contact with RW when ambulating.                            Cognition Arousal: Alert Behavior During Therapy: WFL for tasks assessed/performed Overall Cognitive Status: Impaired/Different from baseline Area of Impairment: Safety/judgement, Orientation                 Orientation Level: Person (Pt sated name and DOB correctly but reported his age as 73 y.o.)       Safety/Judgement: Decreased awareness of safety, Decreased awareness of deficits   Problem Solving: Slow processing General Comments: Pt would occasionally break into dance while sitting EOB or walking. Re-direction and re-orientation to task required to increase pt's focus and maintain safety.        Exercises      General Comments General comments (skin integrity, edema, etc.): VSS; pt denied dizziness.      Pertinent Vitals/Pain Pain Assessment Pain Assessment: 0-10 Pain Score: 4  Pain Location: R side Pain Descriptors / Indicators: Aching, Discomfort Pain Intervention(s): Monitored during session    Home Living                          Prior Function            PT Goals (current goals can now be found in the care plan section) Acute Rehab PT Goals Patient Stated Goal: Go Home PT Goal Formulation: With patient Time For Goal Achievement: 08/29/23 Potential to Achieve Goals: Good Progress towards PT goals: Goals updated    Frequency    Min 1X/week      PT Plan      Co-evaluation              AM-PAC PT "6 Clicks" Mobility   Outcome Measure  Help needed turning from your back to your side while in a flat bed without using bedrails?: A Little Help needed moving from lying on your back to sitting on the side of a flat bed without using bedrails?: A Little Help needed moving to and from a bed to a chair (including a wheelchair)?: A Little Help needed standing up from a  chair using your arms (e.g., wheelchair or bedside chair)?: A Little Help needed to walk in hospital room?: A Lot Help needed climbing 3-5 steps with a railing? : A Lot 6 Click Score: 16    End of Session Equipment Utilized During Treatment: Gait belt Activity Tolerance: Other (comment) (Treatment limited secondary to arrival of transport to take pt off floor.) Patient left: in bed;Other (comment) Herbalist member) Nurse Communication: Mobility status PT Visit Diagnosis: Other abnormalities of gait and mobility (R26.89);Muscle weakness (generalized) (M62.81);Unsteadiness on feet (R26.81);Difficulty in walking, not elsewhere classified (R26.2)     Time: 1610-9604 PT Time Calculation (min) (ACUTE ONLY): 19 min  Charges:    $Gait Training: 8-22 mins                       Cheri Guppy, PT, DPT Acute Rehabilitation Services Office: [phone] Secure Chat Preferred   Richardson Chiquito 08/15/2023, 3:55 PM

## 2023-08-15 NOTE — Progress Notes (Signed)
PT Cancellation Note  Patient Details Name: Francisco Padilla MRN: [id number] DOB: [birth year]   Cancelled Treatment:    Reason Eval/Treat Not Completed: Fatigue/lethargy limiting ability to participate. Pt politely declined citing fatigue (7/10 RPE) following bathing and personal hygiene being addressed this morning. Will follow-up as times allows.   Cheri Guppy, PT, DPT Acute Rehabilitation Services Office: [phone] Secure Chat Preferred    Francisco Padilla 08/15/2023, 11:13 AM

## 2023-08-15 NOTE — Progress Notes (Signed)
TRH night cross cover note:   I was notified by RN of the patient's request for resumption of his home oral Protonix, which she takes on a twice daily basis, relative to the existing order for daily oral Protonix and prn tums.  He specifically conveys that he prefers the Protonix to be on a twice daily basis as opposed to doing orders for daily Protonix and as needed Tums.  I subsequently resumed his home oral Protonix twice daily.    Newton Pigg, DO Hospitalist

## 2023-08-15 NOTE — Progress Notes (Signed)
   IR Note  Biliary drain capped 1/20 per [facility] and discussion with Dr Fredia Sorrow  [facility]S contacted me this am Noted Alk Phos and wbc have increased Pt does have some abd pain also  Placed drain back to drainage per request  CCS to contact me with any needs

## 2023-08-15 NOTE — TOC Progression Note (Addendum)
Transition of Care [facility]) - Progression Note    Patient Details  Name: Francisco Padilla MRN: [id number] Date of Birth: 03-12-1951  [facility]) CM/SW Contact  Michaela Corner, [place] Phone Number: 08/15/2023, 11:06 AM  Clinical Narrative:   CSW spoke with [place] from [facility].  They have declined at this time.  CSW left VM for Heather w/ [facility].   11:44AM: CSW spoke with Herbert Seta about pt referral for SNF and informed her pt has JP drains and Biliary drain. Herbert Seta stated they can accept JP drains but unsure about Biliary; she will check with her team and update CSW if biliary drain cannot be accepted at SNF. [facility] sent CSW secure email stating they are able to accept pt at this time. CSW called Heather at [facility] and left VM to clarify bed offer.   Expected Discharge Plan: Home w Home Health Services Barriers to Discharge: Continued Medical Work up  Expected Discharge Plan and Services In-house Referral: NA Discharge Planning Services: CM Consult Post Acute Care Choice: NA Living arrangements for the past 2 months: Single Family Home                 DME Arranged: N/A DME Agency: NA       HH Arranged: NA           Social Determinants of Health (SDOH) Interventions SDOH Screenings   Food Insecurity: No Food Insecurity (07/29/2023)  Housing: Low Risk  (07/29/2023)  Transportation Needs: No Transportation Needs (07/29/2023)  Utilities: Not At Risk (07/29/2023)  Social Connections: Patient Declined (07/31/2023)  Tobacco Use: Low Risk  (08/01/2023)    Readmission Risk Interventions     No data to display

## 2023-08-15 NOTE — Plan of Care (Signed)

## 2023-08-15 NOTE — Progress Notes (Signed)
14 Days Post-Op  Subjective: Slept well overnight. Oxy is helping shoulder pain.  He tells me he has been having R shoulder pain for 3 days, but yesterday was the first time he has commented on this, at least to our service.  He is still eating well.  He is very comfortable in bed right now.  He does state that he is forgetful and doesn't always remember what we say, etc.  Objective: Vital signs in last 24 hours: Temp:  [98.2 F (36.8 C)-98.5 F (36.9 C)] 98.2 F (36.8 C) (01/22 0755) Pulse Rate:  [86-106] 86 (01/22 0755) Resp:  [18-19] 18 (01/22 0755) BP: (90-100)/(62-74) 90/73 (01/22 0755) SpO2:  [94 %-99 %] 95 % (01/22 0755) Weight:  [97 kg] 97 kg (01/22 0539) Last BM Date : 08/14/23  Intake/Output from previous day: 01/21 0701 - 01/22 0700 In: 420 [P.O.:120; IV Piggyback:300] Out: 780 [Urine:650; Drains:130] Intake/Output this shift: No intake/output data recorded.  PE: Gen: NAD Abd: soft, incisions c/d/I, PTC capped, IR perc drain with a bit more purulent output today than yesterday.  130cc documented yesterday.  Surgical drain with thin dark serous fluid mixed with thick purulent appearing output this morning.  At least 20cc in bulb, which is increased from previous days it appears.   Lab Results:  Recent Labs    08/14/23 0540 08/15/23 0415  WBC 12.9* 27.3*  HGB 9.6* 10.0*  HCT 28.7* 30.3*  PLT 315 302   BMET Recent Labs    08/14/23 0540 08/15/23 0415  NA 133* 134*  K 3.7 4.2  CL 101 101  CO2 27 26  GLUCOSE 80 167*  BUN 19 21  CREATININE 0.62 0.73  CALCIUM 8.3* 8.6*   PT/INR No results for input(s): "LABPROT", "INR" in the last 72 hours.  CMP     Component Value Date/Time   NA 134 (L) 08/15/2023 0415   K 4.2 08/15/2023 0415   CL 101 08/15/2023 0415   CO2 26 08/15/2023 0415   GLUCOSE 167 (H) 08/15/2023 0415   BUN 21 08/15/2023 0415   CREATININE 0.73 08/15/2023 0415   CREATININE 0.77 07/19/2023 1128   CALCIUM 8.6 (L) 08/15/2023 0415   PROT  4.9 (L) 08/15/2023 0415   ALBUMIN 2.0 (L) 08/15/2023 0415   AST 26 08/15/2023 0415   ALT 26 08/15/2023 0415   ALKPHOS 164 (H) 08/15/2023 0415   BILITOT 0.9 08/15/2023 0415   GFRNONAA >60 08/15/2023 0415   GFRAA  10/23/2009 0135    >60        The eGFR has been calculated using the MDRD equation. This calculation has not been validated in all clinical situations. eGFR's persistently <60 mL/min signify possible Chronic Kidney Disease.   Lipase     Component Value Date/Time   LIPASE 29 07/29/2023 0014       Studies/Results: DG Shoulder Right Result Date: 08/14/2023 CLINICAL DATA:  Right shoulder pain for several days EXAM: RIGHT SHOULDER - 2+ VIEW COMPARISON:  None Available. FINDINGS: Internal rotation, external rotation, transscapular, and axillary views of the right shoulder are obtained on 5 images. No acute fracture, subluxation, or dislocation. Mild hypertrophic changes of the acromioclavicular joint. Soft tissues are unremarkable. Continued vascular congestion within the visualized right hemithorax. Right-sided PICC partially visualized. IMPRESSION: 1. Acromioclavicular joint osteoarthritis. 2. No acute bony abnormality. Electronically Signed   By: Sharlet Salina M.D.   On: 08/14/2023 17:25      Anti-infectives: Anti-infectives (From admission, onward)    Start  Dose/Rate Route Frequency Ordered Stop   08/13/23 0800  meropenem (MERREM) 1 g in sodium chloride 0.9 % 100 mL IVPB        1 g 200 mL/hr over 30 Minutes Intravenous Every 8 hours 08/13/23 0714     08/09/23 1400  cefTRIAXone (ROCEPHIN) 2 g in sodium chloride 0.9 % 100 mL IVPB  Status:  Discontinued        2 g 200 mL/hr over 30 Minutes Intravenous To Radiology 08/09/23 1254 08/10/23 0716   08/09/23 1245  cefTRIAXone (ROCEPHIN) 2 g in sodium chloride 0.9 % 100 mL IVPB  Status:  Discontinued       Note to Pharmacy: Please do not give on floor. To be given in IR.   2 g 200 mL/hr over 30 Minutes Intravenous To  Radiology 08/09/23 1152 08/09/23 1213   08/07/23 1100  piperacillin-tazobactam (ZOSYN) IVPB 3.375 g  Status:  Discontinued        3.375 g 12.5 mL/hr over 240 Minutes Intravenous Every 8 hours 08/07/23 1008 08/13/23 0714   08/07/23 0000  piperacillin-tazobactam (ZOSYN) 3.375 GM/50ML IVPB        3.375 g Intravenous Every 8 hours 08/07/23 1501     07/29/23 0800  piperacillin-tazobactam (ZOSYN) IVPB 3.375 g        3.375 g 12.5 mL/hr over 240 Minutes Intravenous Every 8 hours 07/29/23 0207 08/06/23 2359   07/29/23 0030  piperacillin-tazobactam (ZOSYN) IVPB 3.375 g        3.375 g 100 mL/hr over 30 Minutes Intravenous  Once 07/29/23 0020 07/29/23 0147        Assessment/Plan POD 14, s/p subtotal cholecystectomy for gangrenous cholecystitis, Dr. Hillery Hunter 1/8 -regular diet -cont abx therapy, but switch to Merrem as E coli in cultures is resistant to zosyn.  WBC 27K today, AF.   -patient looks overall well so odd to see WBC jump this much.  However, his alk phos increased overnight and his JP drains have increase in their output.  We will plan to uncap his PTC since that is the only intervention that we have done recently.  Given the increase in drain outputs, WBC, we will scan his abdomen today to make sure nothing else is going on -d/w patient and he agrees with this plan.  He does state that he has forgetfulness so this is challenging day to day as I think sometimes he does forget the plans set before him.  We will just continue to try to restate the plans to keep him up to date. -Eliquis restarted -discussed with IR and primary service today. -will follow up with Dr. Hillery Hunter and IR as outpatient.   - I contacted [name], [facility], and [facility] yesterday on behalf of the patient and his request to transfer as well as need for possible transfer due to ERCP difficulties in the setting of bypass anatomy. -[name] declined due to capacity.  [facility] was reviewing his case and the patient then refused this  location so request for transfer was withdrawn.  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  They also have no beds and unclear at what point that may change to even accept the patient.  Their recommendation was to have IR place a PTC drain so this stents his CBD and essentially accomplishes the same goal in a safe technique.   FEN -regular VTE - Eliquis ID - Zosyn, changed to  merrem on 1/20  A fib AKI Peripheral neuropathy GERD HLD Hx of gastric bypass   LOS: 17 days    Letha Cape , [facility] Surgery 08/15/2023, 8:38 AM Please see Amion for pager number during day hours 7:00am-4:30pm or 7:00am -11:30am on weekends

## 2023-08-15 NOTE — Progress Notes (Signed)
Reviewed CT with IR attending Dr Autumn Messing Pt has increased perihepatic fluid Concern that PTC may have moved out of position which may account for fluid, WBC, rt shoulder pain. IR to interrogate PTC and sample perihepatic fluid Also d/w TRH  Mary Sella. Andrey Campanile, MD, FACS General, Bariatric, & Minimally Invasive Surgery [facility],  [facility]

## 2023-08-16 ENCOUNTER — Encounter (HOSPITAL_COMMUNITY): Payer: Self-pay

## 2023-08-16 DIAGNOSIS — N179 Acute kidney failure, unspecified: Secondary | ICD-10-CM | POA: Diagnosis not present

## 2023-08-16 DIAGNOSIS — I4891 Unspecified atrial fibrillation: Secondary | ICD-10-CM | POA: Diagnosis not present

## 2023-08-16 DIAGNOSIS — K810 Acute cholecystitis: Secondary | ICD-10-CM | POA: Diagnosis not present

## 2023-08-16 DIAGNOSIS — I350 Nonrheumatic aortic (valve) stenosis: Secondary | ICD-10-CM | POA: Diagnosis not present

## 2023-08-16 LAB — DIFFERENTIAL
Abs Immature Granulocytes: 0.13 10*3/uL — ABNORMAL HIGH (ref 0.00–0.07)
Basophils Absolute: 0 10*3/uL (ref 0.0–0.1)
Basophils Relative: 0 %
Eosinophils Absolute: 0.1 10*3/uL (ref 0.0–0.5)
Eosinophils Relative: 0 %
Immature Granulocytes: 1 %
Lymphocytes Relative: 4 %
Lymphs Abs: 0.7 10*3/uL (ref 0.7–4.0)
Monocytes Absolute: 1.1 10*3/uL — ABNORMAL HIGH (ref 0.1–1.0)
Monocytes Relative: 6 %
Neutro Abs: 17.1 10*3/uL — ABNORMAL HIGH (ref 1.7–7.7)
Neutrophils Relative %: 89 %

## 2023-08-16 LAB — CBC
HCT: 28 % — ABNORMAL LOW (ref 39.0–52.0)
Hemoglobin: 9.3 g/dL — ABNORMAL LOW (ref 13.0–17.0)
MCH: 31.1 pg (ref 26.0–34.0)
MCHC: 33.2 g/dL (ref 30.0–36.0)
MCV: 93.6 fL (ref 80.0–100.0)
Platelets: 266 10*3/uL (ref 150–400)
RBC: 2.99 MIL/uL — ABNORMAL LOW (ref 4.22–5.81)
RDW: 13.7 % (ref 11.5–15.5)
WBC: 19.2 10*3/uL — ABNORMAL HIGH (ref 4.0–10.5)
nRBC: 0 % (ref 0.0–0.2)

## 2023-08-16 LAB — COMPREHENSIVE METABOLIC PANEL
ALT: 32 U/L (ref 0–44)
AST: 22 U/L (ref 15–41)
Albumin: 1.9 g/dL — ABNORMAL LOW (ref 3.5–5.0)
Alkaline Phosphatase: 222 U/L — ABNORMAL HIGH (ref 38–126)
Anion gap: 7 (ref 5–15)
BUN: 23 mg/dL (ref 8–23)
CO2: 26 mmol/L (ref 22–32)
Calcium: 8.4 mg/dL — ABNORMAL LOW (ref 8.9–10.3)
Chloride: 98 mmol/L (ref 98–111)
Creatinine, Ser: 0.63 mg/dL (ref 0.61–1.24)
GFR, Estimated: >60 mL/min (ref >60)
Glucose, Bld: 98 mg/dL (ref 70–99)
Potassium: 4 mmol/L (ref 3.5–5.1)
Sodium: 131 mmol/L — ABNORMAL LOW (ref 135–145)
Total Bilirubin: 0.8 mg/dL (ref 0.0–1.2)
Total Protein: 5 g/dL — ABNORMAL LOW (ref 6.5–8.1)

## 2023-08-16 NOTE — TOC Progression Note (Addendum)
Transition of Care [facility]) - Progression Note    Patient Details  Name: Francisco Padilla MRN: [id number] Date of Birth: Jul 19, 1951  [facility]) CM/SW Contact  Michaela Corner, [place] Phone Number: 08/16/2023, 12:08 PM  Clinical Narrative:   CSW spoke with [facility] admissions coordinator, Herbert Seta, about pt having Biliary drain. Heather states SNF can accept pt with Biliary drain and bed offer is confirmed. Heather requested additional clinicals from CSW at this time.  12:16PM: CSW notified pt of acceptance to [facility]. Pt stated "that's great news, thanks."  Per MD, will try for DC this weekend, more likely to DC mon or tues next week (1/27 or 1/28).   12:26 PM: CSW sent Heather requested documents at this time.   Expected Discharge Plan: Home w Home Health Services Barriers to Discharge: Continued Medical Work up  Expected Discharge Plan and Services In-house Referral: NA Discharge Planning Services: CM Consult Post Acute Care Choice: NA Living arrangements for the past 2 months: Single Family Home                 DME Arranged: N/A DME Agency: NA       HH Arranged: NA           Social Determinants of Health (SDOH) Interventions SDOH Screenings   Food Insecurity: No Food Insecurity (07/29/2023)  Housing: Low Risk  (07/29/2023)  Transportation Needs: No Transportation Needs (07/29/2023)  Utilities: Not At Risk (07/29/2023)  Social Connections: Patient Declined (07/31/2023)  Tobacco Use: Low Risk  (08/01/2023)    Readmission Risk Interventions     No data to display

## 2023-08-16 NOTE — Plan of Care (Signed)
  Problem: Nutrition: Goal: Adequate nutrition will be maintained Outcome: Progressing   Problem: Coping: Goal: Level of anxiety will decrease Outcome: Progressing   Problem: Pain Management: Goal: General experience of comfort will improve Outcome: Progressing   Problem: Safety: Goal: Ability to remain free from injury will improve Outcome: Progressing

## 2023-08-16 NOTE — Progress Notes (Signed)
15 Days Post-Op  Subjective: Pain well controlled and seems a bit less particularly in his right shoulder since IR procedure yesterday.  No other complaints.  Objective: Vital signs in last 24 hours: Temp:  [97.7 F (36.5 C)-98.2 F (36.8 C)] 98.2 F (36.8 C) (01/23 0711) Pulse Rate:  [87-105] 100 (01/23 0711) Resp:  [14-22] 14 (01/23 0711) BP: (83-101)/(60-68) 92/65 (01/23 0711) SpO2:  [95 %-98 %] 98 % (01/23 0711) Weight:  [90.2 kg] 90.2 kg (01/23 0501) Last BM Date : 08/15/23  Intake/Output from previous day: 01/22 0701 - 01/23 0700 In: 1925 [P.O.:1260; IV Piggyback:660] Out: 2386 [Urine:850; Drains:1535; Stool:1] Intake/Output this shift: Total I/O In: 120 [P.O.:120] Out: 250 [Drains:250]  PE: Gen: NAD Heart: irregular, tachy in 110s Abd: soft, incisions c/d/I, PTC with air and bile in drain bag, 1310cc yesterday,  both perc drains with purulent output today, IR with 95cc and surgical with 130cc  Lab Results:  Recent Labs    08/15/23 0415 08/16/23 0700  WBC 27.3* 19.2*  HGB 10.0* 9.3*  HCT 30.3* 28.0*  PLT 302 266   BMET Recent Labs    08/15/23 0415 08/16/23 0700  NA 134* 131*  K 4.2 4.0  CL 101 98  CO2 26 26  GLUCOSE 167* 98  BUN 21 23  CREATININE 0.73 0.63  CALCIUM 8.6* 8.4*   PT/INR No results for input(s): "LABPROT", "INR" in the last 72 hours.  CMP     Component Value Date/Time   NA 131 (L) 08/16/2023 0700   K 4.0 08/16/2023 0700   CL 98 08/16/2023 0700   CO2 26 08/16/2023 0700   GLUCOSE 98 08/16/2023 0700   BUN 23 08/16/2023 0700   CREATININE 0.63 08/16/2023 0700   CREATININE 0.77 07/19/2023 1128   CALCIUM 8.4 (L) 08/16/2023 0700   PROT 5.0 (L) 08/16/2023 0700   ALBUMIN 1.9 (L) 08/16/2023 0700   AST 22 08/16/2023 0700   ALT 32 08/16/2023 0700   ALKPHOS 222 (H) 08/16/2023 0700   BILITOT 0.8 08/16/2023 0700   GFRNONAA >60 08/16/2023 0700   GFRAA  10/23/2009 0135    >60        The eGFR has been calculated using the MDRD  equation. This calculation has not been validated in all clinical situations. eGFR's persistently <60 mL/min signify possible Chronic Kidney Disease.   Lipase     Component Value Date/Time   LIPASE 29 07/29/2023 0014       Studies/Results: IR Catheter Tube Change Result Date: 08/15/2023 INDICATION: 73 year old male with history of gangrenous cholecystitis status post partial cholecystectomy necessitating percutaneous biliary drain placement and biloma drain placement on 08/10/2023. Capping trial failed with significantly increased abdominal pain and sepsis. CT abdomen pelvis demonstrates malpositioned biliary drain and perihepatic fluid. EXAM: 1. Biliary drain check and exchange. 2. Percutaneous aspiration of perihepatic fluid. MEDICATIONS: Rocephin 2 gm IV; The antibiotic was administered within an appropriate time frame prior to the initiation of the procedure. ANESTHESIA/SEDATION: None. FLUOROSCOPY TIME:  24.5 mGy COMPLICATIONS: None immediate. PROCEDURE: Informed written consent was obtained from the patient after a thorough discussion of the procedural risks, benefits and alternatives. All questions were addressed. Maximal Sterile Barrier Technique was utilized including caps, mask, sterile gowns, sterile gloves, sterile drape, hand hygiene and skin antiseptic. A timeout was performed prior to the initiation of the procedure. Procedure scout radiograph of the right upper quadrant demonstrated kinking about the perihepatic aspect of the indwelling biliary drain with the radiopaque side hole near  the periphery of the liver. Subdermal Local anesthesia was provided at the planned needle entry site with 1% lidocaine. Contrast injection demonstrated patency of the drain, however there was spillage into the perihepatic space. No definite evidence of biliary ductal dilation. The external portion of the drain was cut to release inter pigtail. An Amplatz wire was then inserted. The drain was removed  over the wire. A Kumpe the catheter was placed over the wire and directed into the duodenum. Hand injection of contrast via catheter demonstrated intraluminal position. The Amplatz wire was then reinserted and the 5 French catheter was exchanged for a new, 12 [place], 40 cm biliary drain. The radiopaque marker was positioned just lateral to the hilum. The pigtail portion was formed in the duodenum. Hand injection of contrast demonstrated patency of the indwelling drain with persistent bile leak in the region of the proximal common bile duct. Ultrasound evaluation of the right upper quadrant demonstrated a small heterogeneously hypoechoic collection surrounding liver. Under direct ultrasound visualization, a 19 gauge, 7 cm Yueh catheter was directed into the collection. Aspiration was performed which showed yielded trace, thick bilious and bloody fluid. The fluid is very difficult to aspirate given gelatinous nature. The Yueh catheter was removed. The drain was affixed to the skin with an interrupted 0 silk suture. Sterile bandage was applied. The patient tolerated the procedure well was transferred back to the floor in good condition. IMPRESSION: 1. Technically successful exchange and repositioning of malpositioned indwelling 12 [place] biliary drain. 2. Attempted perihepatic fluid aspiration yielded trace thick bilious and bloody fluid. Given prior drain malposition, this fluid likely represents bilious leakage which should resolve spontaneously via indwelling drainage catheters. Marliss Coots, MD [facility] Radiology Electronically Signed   By: Marliss Coots M.D.   On: 08/15/2023 22:05   IR US Guide Bx Asp/Drain Result Date: 08/15/2023 INDICATION: 73 year old male with history of gangrenous cholecystitis status post partial cholecystectomy necessitating percutaneous biliary drain placement and biloma drain placement on 08/10/2023. Capping trial failed with  significantly increased abdominal pain and sepsis. CT abdomen pelvis demonstrates malpositioned biliary drain and perihepatic fluid. EXAM: 1. Biliary drain check and exchange. 2. Percutaneous aspiration of perihepatic fluid. MEDICATIONS: Rocephin 2 gm IV; The antibiotic was administered within an appropriate time frame prior to the initiation of the procedure. ANESTHESIA/SEDATION: None. FLUOROSCOPY TIME:  24.5 mGy COMPLICATIONS: None immediate. PROCEDURE: Informed written consent was obtained from the patient after a thorough discussion of the procedural risks, benefits and alternatives. All questions were addressed. Maximal Sterile Barrier Technique was utilized including caps, mask, sterile gowns, sterile gloves, sterile drape, hand hygiene and skin antiseptic. A timeout was performed prior to the initiation of the procedure. Procedure scout radiograph of the right upper quadrant demonstrated kinking about the perihepatic aspect of the indwelling biliary drain with the radiopaque side hole near the periphery of the liver. Subdermal Local anesthesia was provided at the planned needle entry site with 1% lidocaine. Contrast injection demonstrated patency of the drain, however there was spillage into the perihepatic space. No definite evidence of biliary ductal dilation. The external portion of the drain was cut to release inter pigtail. An Amplatz wire was then inserted. The drain was removed over the wire. A Kumpe the catheter was placed over the wire and directed into the duodenum. Hand injection of contrast via catheter demonstrated intraluminal position. The Amplatz wire was then reinserted and the 5 French catheter was exchanged for a new, 12 [place], 40 cm biliary drain.  The radiopaque marker was positioned just lateral to the hilum. The pigtail portion was formed in the duodenum. Hand injection of contrast demonstrated patency of the indwelling drain with persistent bile leak in the region of the proximal  common bile duct. Ultrasound evaluation of the right upper quadrant demonstrated a small heterogeneously hypoechoic collection surrounding liver. Under direct ultrasound visualization, a 19 gauge, 7 cm Yueh catheter was directed into the collection. Aspiration was performed which showed yielded trace, thick bilious and bloody fluid. The fluid is very difficult to aspirate given gelatinous nature. The Yueh catheter was removed. The drain was affixed to the skin with an interrupted 0 silk suture. Sterile bandage was applied. The patient tolerated the procedure well was transferred back to the floor in good condition. IMPRESSION: 1. Technically successful exchange and repositioning of malpositioned indwelling 12 [place] biliary drain. 2. Attempted perihepatic fluid aspiration yielded trace thick bilious and bloody fluid. Given prior drain malposition, this fluid likely represents bilious leakage which should resolve spontaneously via indwelling drainage catheters. Marliss Coots, MD [facility] Radiology Electronically Signed   By: Marliss Coots M.D.   On: 08/15/2023 22:05   CT ABDOMEN PELVIS W CONTRAST Result Date: 08/15/2023 CLINICAL DATA:  Intra-abdominal abscess. EXAM: CT ABDOMEN AND PELVIS WITH CONTRAST TECHNIQUE: Multidetector CT imaging of the abdomen and pelvis was performed using the standard protocol following bolus administration of intravenous contrast. RADIATION DOSE REDUCTION: This exam was performed according to the departmental dose-optimization program which includes automated exposure control, adjustment of the mA and/or kV according to patient size and/or use of iterative reconstruction technique. CONTRAST:  75mL OMNIPAQUE IOHEXOL 350 MG/ML SOLN COMPARISON:  CT of the abdomen pelvis dated 07/28/2023. FINDINGS: Lower chest: Partially visualized bilateral pleural effusions, right greater than left with associated partial compressive atelectasis of the  lower lobes versus pneumonia. This is new since the prior CT. There is 3 vessel coronary vascular calcification. The tip of a central venous line noted at the cavoatrial junction. Tiny pockets of air adjacent to the liver, likely introduced via catheters. There is a small ascites new or significantly progressed since the prior CT. Hepatobiliary: Irregular liver contour suggestive of cirrhosis. Cholecystectomy. There is a 5.5 x 10.5 cm complex collection containing air at the cholecystectomy bed consistent with an abscess. Percutaneous transhepatic internal/external drain with tip in the region of the second portion of the duodenum and a percutaneous subhepatic drainage catheter with tip in the collection/abscess noted. Pancreas: The pancreas is moderately atrophic and poorly visualized. Spleen: Normal in size without focal abnormality. Adrenals/Urinary Tract: The adrenal glands unremarkable. Multiple nonobstructing bilateral renal calculi measure up to 5 mm in the interpolar right kidney. There is a 9 mm stone in the distal left ureter with mild left hydronephrosis. The right ureter and urinary bladder appear unremarkable. Stomach/Bowel: There is sigmoid diverticulosis and scattered colonic diverticula. There is postsurgical changes of gastric bypass. There is a small hiatal hernia. There is no bowel obstruction. Vascular/Lymphatic: Moderate aortoiliac atherosclerotic disease. The IVC is unremarkable. No portal venous gas. There is no adenopathy. Reproductive: The prostate and seminal vesicles are grossly unremarkable. Other: There is diffuse subcutaneous edema and anasarca. Musculoskeletal: Osteopenia with degenerative changes. No acute osseous pathology. IMPRESSION: 1. Status post cholecystectomy with a 5.5 x 10.5 cm abscess. Percutaneous drainage catheter with tip in the abscess. 2. Percutaneous transhepatic internal/external drain with tip in the region of the second portion of the duodenum. 3. Small ascites new  or significantly progressed since  the prior CT. 4. Partially visualized bilateral pleural effusions, right greater than left, new since the prior CT. 5. A 9 mm stone in the distal left ureter with mild left hydronephrosis. 6. Nonobstructing bilateral renal calculi. 7. Sigmoid diverticulosis. No bowel obstruction. 8.  Aortic Atherosclerosis (ICD10-I70.0). Electronically Signed   By: Elgie Collard M.D.   On: 08/15/2023 17:08   DG Shoulder Right Result Date: 08/14/2023 CLINICAL DATA:  Right shoulder pain for several days EXAM: RIGHT SHOULDER - 2+ VIEW COMPARISON:  None Available. FINDINGS: Internal rotation, external rotation, transscapular, and axillary views of the right shoulder are obtained on 5 images. No acute fracture, subluxation, or dislocation. Mild hypertrophic changes of the acromioclavicular joint. Soft tissues are unremarkable. Continued vascular congestion within the visualized right hemithorax. Right-sided PICC partially visualized. IMPRESSION: 1. Acromioclavicular joint osteoarthritis. 2. No acute bony abnormality. Electronically Signed   By: Sharlet Salina M.D.   On: 08/14/2023 17:25      Anti-infectives: Anti-infectives (From admission, onward)    Start     Dose/Rate Route Frequency Ordered Stop   08/15/23 1615  cefTRIAXone (ROCEPHIN) 2 g in sodium chloride 0.9 % 100 mL IVPB        2 g 200 mL/hr over 30 Minutes Intravenous To Radiology 08/15/23 1525 08/15/23 1643   08/13/23 0800  meropenem (MERREM) 1 g in sodium chloride 0.9 % 100 mL IVPB        1 g 200 mL/hr over 30 Minutes Intravenous Every 8 hours 08/13/23 0714     08/09/23 1400  cefTRIAXone (ROCEPHIN) 2 g in sodium chloride 0.9 % 100 mL IVPB  Status:  Discontinued        2 g 200 mL/hr over 30 Minutes Intravenous To Radiology 08/09/23 1254 08/10/23 0716   08/09/23 1245  cefTRIAXone (ROCEPHIN) 2 g in sodium chloride 0.9 % 100 mL IVPB  Status:  Discontinued       Note to Pharmacy: Please do not give on floor. To be given in  IR.   2 g 200 mL/hr over 30 Minutes Intravenous To Radiology 08/09/23 1152 08/09/23 1213   08/07/23 1100  piperacillin-tazobactam (ZOSYN) IVPB 3.375 g  Status:  Discontinued        3.375 g 12.5 mL/hr over 240 Minutes Intravenous Every 8 hours 08/07/23 1008 08/13/23 0714   08/07/23 0000  piperacillin-tazobactam (ZOSYN) 3.375 GM/50ML IVPB        3.375 g Intravenous Every 8 hours 08/07/23 1501     07/29/23 0800  piperacillin-tazobactam (ZOSYN) IVPB 3.375 g        3.375 g 12.5 mL/hr over 240 Minutes Intravenous Every 8 hours 07/29/23 0207 08/06/23 2359   07/29/23 0030  piperacillin-tazobactam (ZOSYN) IVPB 3.375 g        3.375 g 100 mL/hr over 30 Minutes Intravenous  Once 07/29/23 0020 07/29/23 0147        Assessment/Plan POD 15, s/p subtotal cholecystectomy for gangrenous cholecystitis, Dr. Hillery Hunter 1/8 -regular diet -cont abx therapy, but switch to Merrem as E coli in cultures is resistant to zosyn.  WBC 19K today, AF.   -IR procedure reviewed.  Will continue to leave PTC to gravity for the next several days to let things settle out.  Likely plan to retry capping early next week -cont JP drains as they are continuing to drain this subhepatic fluid collection -Eliquis restarted -will follow up with Dr. Hillery Hunter and IR as outpatient.   - I contacted [name], [facility], and [facility] yesterday on behalf of the patient  and his request to transfer as well as need for possible transfer due to ERCP difficulties in the setting of bypass anatomy. -[name] declined due to capacity.  [facility] was reviewing his case and the patient then refused this location so request for transfer was withdrawn.  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  They also have no beds and unclear at what point that may change to even accept the patient.  Their recommendation was to have IR place a PTC drain so this stents his CBD and  essentially accomplishes the same goal in a safe technique.   FEN -regular VTE - Eliquis ID - Zosyn, changed to merrem on 1/20  A fib AKI Peripheral neuropathy GERD HLD Hx of gastric bypass   LOS: 18 days    Letha Cape , [facility] 08/16/2023, 9:04 AM Please see Amion for pager number during day hours 7:00am-4:30pm or 7:00am -11:30am on weekends

## 2023-08-16 NOTE — Progress Notes (Signed)
[facility]                                                                              Francisco Padilla, is a 73 y.o. male, DOB - Apr 13, 1951, ZOX:[id number] Admit date - 07/28/2023    Outpatient Primary MD for the patient is Francisco Sheffield, MD  LOS - 18  days  Chief Complaint  Patient presents with   Weakness       Brief summary   Patient is a 73 year old male with pertinent PMH OSA on CPAP, HLD, chronic hypotension, PAF on Eliquis, PVD presents to [facility] ED on 1/5 with cholecystitis.   Patient complaining of weakness/fatigue going on for about a month.  Patient also having nausea, abdominal pain, diarrhea.  Abdominal pain worse in right upper quadrant.  Denies fever/chills.  Came to [facility] ED on 1/5 for further workup.  On arrival patient noted to be slightly tachycardic 100s and mildly hypotensive 93/73.  CT ABD/pelvis showing acute cholecystitis.  Surgery consulted.  Given IV fluids and Zosyn.  Awaiting surgery 48 hours for Eliquis washout. Cards consulted for preoperative evaluation of cholecystectomy. Echo on 1/7 showing lvef 60-65%; severe aortic stenosis.  On 1/8 patient taken to the OR for cholecystectomy.  While in the OR patient had tachycardia 150s and started on esmolol.  Patient later became hypotensive and started on neo.  Postop patient taken to ICU while on pressors.  PCCM consulted.   1/5 admitted with cholecystitis 1/8 s/p cholecystectomy; given esmolol for HR 150s then became hypotensive on neo-; postop taken to ICU and PCCM consulted 1/10 episode of rapid A-fib with RVR in the 150s, systolic pressure in the 70s. 81/19: no clinical signs of bile leak, and no need for ERCP.  01/14: worsening drain out put, suggesting resumption of bile leak. Plan for transfer to tertiary care.   1/17: Image guided drain into subhepatic abscess, PTC 1/20: PTC capped by IR, LFTs normal 1/22: Right-sided abdominal pain, reconnected PTC, CT abdomen   Assessment & Plan     Principal Problem:   Acute cholecystitis Gangrenous cholecystitis, complicated with sepsis status post subtotal cholecystectomy by Dr. Hillery Hunter on 1/8  -Postop ICU care due to tachycardia requiring esmolol and subsequent hypotension/shock requiring vasopressors, A-fib with RVR. -MRCP: undrained fluid collection that the surgical drain traverses but unable to completely evacuate -IR following, s/p image guided drain into the subhepatic abscess and PTC on 1/17 (Dr. Loreta Ave) -Cultures + E. coli, resistant to Zosyn, changed to IV meropenem on 08/13/2023 -Per IR, would not place metal stent for the bile leak and recommended to continue drain.  PTC initially capped by IR on 1/20.  However on 1/21, patient had right-sided abdominal pain/shoulder pain, elevated leukocytosis, alk phos, PTC reconnected to drain.  -Continue JP drains for the subhepatic fluid collection -Eliquis was resumed on 1/20 anticipating no further procedures -Surgery, IR following -Leukocytosis improving   Chronic hypotension -Initially placed on Solu-Cortef, stress dose steroids have been discontinued, patient has been chronically on Florinef for hypotension. -B12, folate normal, a.m. cortisol also normal 15.0 -Has received IV albumin -BP borderline, asymptomatic, continue midodrine 15 mg 3 times daily, Florinef -  Follow B1    AKI (acute kidney injury) (HCC) -Creatinine stable  Hyponatremia -Asymptomatic, continue to follow     Aortic stenosis, severe Stable, no signs of acute decompensation   Atrial fibrillation with RVR (HCC) -Rate controlled -Continue midodrine for BP -Continue eliquis   History of Roux-en-Y gastric bypass Follow up as outpatient.    OSA on CPAP Continue Home Cpap.    Pressure injury documentation Right buttocks, stage I, Not present on admission -Nursing care  Right shoulder pain -Likely referred from right sided abdomen.  Shoulder x-ray showed osteoarthritis     Severe calorie  malnutrition Nutrition Problem: Severe Malnutrition Etiology: chronic illness (gastric bypass) Signs/Symptoms: severe fat depletion, severe muscle depletion Interventions: Ensure Enlive (each supplement provides 350kcal and 20 grams of protein), MVI, Carnation Instant Breakfast  Estimated body mass index is 24.21 kg/m as calculated from the following:   Height as of this encounter: 6\' 4"  (1.93 m).   Weight as of this encounter: 90.2 kg.  Code Status: Full code DVT Prophylaxis:  SCDs Start: 07/29/23 0131 Place TED hose Start: 07/29/23 0131 apixaban (ELIQUIS) tablet 5 mg   Level of Care: Level of care: Telemetry Cardiac Family Communication: Updated patient' Disposition Plan:      Remains inpatient appropriate: PT evaluation recommended SNF, TOC assisting   Procedures:  08/01/2023 subtotal cholecystectomy (Dr Melody Haver, general surgery) 1/17: Image guided drain into subhepatic abscess, PTC  Consultants:   Cardiology General Surgery Critical care  Antimicrobials:   Anti-infectives (From admission, onward)    Start     Dose/Rate Route Frequency Ordered Stop   08/15/23 1615  cefTRIAXone (ROCEPHIN) 2 g in sodium chloride 0.9 % 100 mL IVPB        2 g 200 mL/hr over 30 Minutes Intravenous To Radiology 08/15/23 1525 08/15/23 1643   08/13/23 0800  meropenem (MERREM) 1 g in sodium chloride 0.9 % 100 mL IVPB        1 g 200 mL/hr over 30 Minutes Intravenous Every 8 hours 08/13/23 0714     08/09/23 1400  cefTRIAXone (ROCEPHIN) 2 g in sodium chloride 0.9 % 100 mL IVPB  Status:  Discontinued        2 g 200 mL/hr over 30 Minutes Intravenous To Radiology 08/09/23 1254 08/10/23 0716   08/09/23 1245  cefTRIAXone (ROCEPHIN) 2 g in sodium chloride 0.9 % 100 mL IVPB  Status:  Discontinued       Note to Pharmacy: Please do not give on floor. To be given in IR.   2 g 200 mL/hr over 30 Minutes Intravenous To Radiology 08/09/23 1152 08/09/23 1213   08/07/23 1100  piperacillin-tazobactam  (ZOSYN) IVPB 3.375 g  Status:  Discontinued        3.375 g 12.5 mL/hr over 240 Minutes Intravenous Every 8 hours 08/07/23 1008 08/13/23 0714   08/07/23 0000  piperacillin-tazobactam (ZOSYN) 3.375 GM/50ML IVPB        3.375 g Intravenous Every 8 hours 08/07/23 1501     07/29/23 0800  piperacillin-tazobactam (ZOSYN) IVPB 3.375 g        3.375 g 12.5 mL/hr over 240 Minutes Intravenous Every 8 hours 07/29/23 0207 08/06/23 2359   07/29/23 0030  piperacillin-tazobactam (ZOSYN) IVPB 3.375 g        3.375 g 100 mL/hr over 30 Minutes Intravenous  Once 07/29/23 0020 07/29/23 0147          Medications  acetaminophen  1,000 mg Oral Q6H   Or   acetaminophen  650 mg Rectal Q6H   acidophilus  1 capsule Oral Daily   alfuzosin  10 mg Oral Q breakfast   apixaban  5 mg Oral BID   calcium carbonate  200 mg of elemental calcium Oral TID   Chlorhexidine Gluconate Cloth  6 each Topical Daily   docusate sodium  100 mg Oral BID   feeding supplement  237 mL Oral BID BM   fludrocortisone  0.1 mg Oral Daily   gabapentin  600 mg Oral QHS   latanoprost  1 drop Both Eyes QHS   midodrine  15 mg Oral TID WC   multivitamin with minerals  1 tablet Oral BID   nutrition supplement (JUVEN)  1 packet Oral BID BM   pantoprazole  40 mg Oral BID   polyethylene glycol  17 g Oral Daily   sodium chloride flush  3 mL Intravenous Q12H   sodium chloride flush  5 mL Intracatheter Q8H   venlafaxine XR  37.5 mg Oral Q breakfast   zolpidem  10 mg Oral QHS      Subjective:   Araceli Bouche was seen and examined today.  No acute complaints this morning.  Eating breakfast without any issues.  No nausea vomiting, chest pain, fevers.    Objective:   Vitals:   08/16/23 0120 08/16/23 0501 08/16/23 0711 08/16/23 1136  BP: 101/65 91/68 92/65  97/63  Pulse: 96 94 100 (!) 108  Resp: 18 18 14 16   Temp: 97.7 F (36.5 C) 98.1 F (36.7 C) 98.2 F (36.8 C)   TempSrc: Oral Oral Oral   SpO2: 96% 96% 98% 97%  Weight:  90.2 kg     Height:        Intake/Output Summary (Last 24 hours) at 08/16/2023 1142 Last data filed at 08/16/2023 1100 Gross per 24 hour  Intake 1805 ml  Output 2786 ml  Net -981 ml     Wt Readings from Last 3 Encounters:  08/16/23 90.2 kg  07/19/23 92.4 kg  06/14/23 96.9 kg   Physical Exam General: Alert and oriented x 3, NAD Cardiovascular: S1 S2 clear, RRR.  Respiratory: CTAB, no wheezing, rales or rhonchi Gastrointestinal: Soft, ND, NBS + 3 drains (2 JP drain, PTC) Ext: no pedal edema bilaterally Neuro: no new deficits Psych: Normal affect       Data Reviewed:  I have personally reviewed following labs    CBC Lab Results  Component Value Date   WBC 19.2 (H) 08/16/2023   RBC 2.99 (L) 08/16/2023   HGB 9.3 (L) 08/16/2023   HCT 28.0 (L) 08/16/2023   MCV 93.6 08/16/2023   MCH 31.1 08/16/2023   PLT 266 08/16/2023   MCHC 33.2 08/16/2023   RDW 13.7 08/16/2023   LYMPHSABS 0.7 08/16/2023   MONOABS 1.1 (H) 08/16/2023   EOSABS 0.1 08/16/2023   BASOSABS 0.0 08/16/2023     Last metabolic panel Lab Results  Component Value Date   NA 131 (L) 08/16/2023   K 4.0 08/16/2023   CL 98 08/16/2023   CO2 26 08/16/2023   BUN 23 08/16/2023   CREATININE 0.63 08/16/2023   GLUCOSE 98 08/16/2023   GFRNONAA >60 08/16/2023   GFRAA  10/23/2009    >60        The eGFR has been calculated using the MDRD equation. This calculation has not been validated in all clinical situations. eGFR's persistently <60 mL/min signify possible Chronic Kidney Disease.   CALCIUM 8.4 (L) 08/16/2023   PROT 5.0 (L) 08/16/2023  ALBUMIN 1.9 (L) 08/16/2023   BILITOT 0.8 08/16/2023   ALKPHOS 222 (H) 08/16/2023   AST 22 08/16/2023   ALT 32 08/16/2023   ANIONGAP 7 08/16/2023    CBG (last 3)  No results for input(s): "GLUCAP" in the last 72 hours.     Coagulation Profile: Recent Labs  Lab 08/09/23 1459  INR 1.1      Radiology Studies: I have personally reviewed the imaging studies  IR  Catheter Tube Change Result Date: 08/15/2023 INDICATION: 73 year old male with history of gangrenous cholecystitis status post partial cholecystectomy necessitating percutaneous biliary drain placement and biloma drain placement on 08/10/2023. Capping trial failed with significantly increased abdominal pain and sepsis. CT abdomen pelvis demonstrates malpositioned biliary drain and perihepatic fluid. EXAM: 1. Biliary drain check and exchange. 2. Percutaneous aspiration of perihepatic fluid. MEDICATIONS: Rocephin 2 gm IV; The antibiotic was administered within an appropriate time frame prior to the initiation of the procedure. ANESTHESIA/SEDATION: None. FLUOROSCOPY TIME:  24.5 mGy COMPLICATIONS: None immediate. PROCEDURE: Informed written consent was obtained from the patient after a thorough discussion of the procedural risks, benefits and alternatives. All questions were addressed. Maximal Sterile Barrier Technique was utilized including caps, mask, sterile gowns, sterile gloves, sterile drape, hand hygiene and skin antiseptic. A timeout was performed prior to the initiation of the procedure. Procedure scout radiograph of the right upper quadrant demonstrated kinking about the perihepatic aspect of the indwelling biliary drain with the radiopaque side hole near the periphery of the liver. Subdermal Local anesthesia was provided at the planned needle entry site with 1% lidocaine. Contrast injection demonstrated patency of the drain, however there was spillage into the perihepatic space. No definite evidence of biliary ductal dilation. The external portion of the drain was cut to release inter pigtail. An Amplatz wire was then inserted. The drain was removed over the wire. A Kumpe the catheter was placed over the wire and directed into the duodenum. Hand injection of contrast via catheter demonstrated intraluminal position. The Amplatz wire was then reinserted and the 5 French catheter was exchanged for a new, 12  [place], 40 cm biliary drain. The radiopaque marker was positioned just lateral to the hilum. The pigtail portion was formed in the duodenum. Hand injection of contrast demonstrated patency of the indwelling drain with persistent bile leak in the region of the proximal common bile duct. Ultrasound evaluation of the right upper quadrant demonstrated a small heterogeneously hypoechoic collection surrounding liver. Under direct ultrasound visualization, a 19 gauge, 7 cm Yueh catheter was directed into the collection. Aspiration was performed which showed yielded trace, thick bilious and bloody fluid. The fluid is very difficult to aspirate given gelatinous nature. The Yueh catheter was removed. The drain was affixed to the skin with an interrupted 0 silk suture. Sterile bandage was applied. The patient tolerated the procedure well was transferred back to the floor in good condition. IMPRESSION: 1. Technically successful exchange and repositioning of malpositioned indwelling 12 [place] biliary drain. 2. Attempted perihepatic fluid aspiration yielded trace thick bilious and bloody fluid. Given prior drain malposition, this fluid likely represents bilious leakage which should resolve spontaneously via indwelling drainage catheters. Marliss Coots, MD [facility] Radiology Electronically Signed   By: Marliss Coots M.D.   On: 08/15/2023 22:05   IR US Guide Bx Asp/Drain Result Date: 08/15/2023 INDICATION: 73 year old male with history of gangrenous cholecystitis status post partial cholecystectomy necessitating percutaneous biliary drain placement and biloma drain placement on 08/10/2023. Capping trial failed with  significantly increased abdominal pain and sepsis. CT abdomen pelvis demonstrates malpositioned biliary drain and perihepatic fluid. EXAM: 1. Biliary drain check and exchange. 2. Percutaneous aspiration of perihepatic fluid. MEDICATIONS: Rocephin 2 gm IV; The  antibiotic was administered within an appropriate time frame prior to the initiation of the procedure. ANESTHESIA/SEDATION: None. FLUOROSCOPY TIME:  24.5 mGy COMPLICATIONS: None immediate. PROCEDURE: Informed written consent was obtained from the patient after a thorough discussion of the procedural risks, benefits and alternatives. All questions were addressed. Maximal Sterile Barrier Technique was utilized including caps, mask, sterile gowns, sterile gloves, sterile drape, hand hygiene and skin antiseptic. A timeout was performed prior to the initiation of the procedure. Procedure scout radiograph of the right upper quadrant demonstrated kinking about the perihepatic aspect of the indwelling biliary drain with the radiopaque side hole near the periphery of the liver. Subdermal Local anesthesia was provided at the planned needle entry site with 1% lidocaine. Contrast injection demonstrated patency of the drain, however there was spillage into the perihepatic space. No definite evidence of biliary ductal dilation. The external portion of the drain was cut to release inter pigtail. An Amplatz wire was then inserted. The drain was removed over the wire. A Kumpe the catheter was placed over the wire and directed into the duodenum. Hand injection of contrast via catheter demonstrated intraluminal position. The Amplatz wire was then reinserted and the 5 French catheter was exchanged for a new, 12 [place], 40 cm biliary drain. The radiopaque marker was positioned just lateral to the hilum. The pigtail portion was formed in the duodenum. Hand injection of contrast demonstrated patency of the indwelling drain with persistent bile leak in the region of the proximal common bile duct. Ultrasound evaluation of the right upper quadrant demonstrated a small heterogeneously hypoechoic collection surrounding liver. Under direct ultrasound visualization, a 19 gauge, 7 cm Yueh catheter was directed into the collection. Aspiration was  performed which showed yielded trace, thick bilious and bloody fluid. The fluid is very difficult to aspirate given gelatinous nature. The Yueh catheter was removed. The drain was affixed to the skin with an interrupted 0 silk suture. Sterile bandage was applied. The patient tolerated the procedure well was transferred back to the floor in good condition. IMPRESSION: 1. Technically successful exchange and repositioning of malpositioned indwelling 12 [place] biliary drain. 2. Attempted perihepatic fluid aspiration yielded trace thick bilious and bloody fluid. Given prior drain malposition, this fluid likely represents bilious leakage which should resolve spontaneously via indwelling drainage catheters. Marliss Coots, MD [facility] Radiology Electronically Signed   By: Marliss Coots M.D.   On: 08/15/2023 22:05   CT ABDOMEN PELVIS W CONTRAST Result Date: 08/15/2023 CLINICAL DATA:  Intra-abdominal abscess. EXAM: CT ABDOMEN AND PELVIS WITH CONTRAST TECHNIQUE: Multidetector CT imaging of the abdomen and pelvis was performed using the standard protocol following bolus administration of intravenous contrast. RADIATION DOSE REDUCTION: This exam was performed according to the departmental dose-optimization program which includes automated exposure control, adjustment of the mA and/or kV according to patient size and/or use of iterative reconstruction technique. CONTRAST:  75mL OMNIPAQUE IOHEXOL 350 MG/ML SOLN COMPARISON:  CT of the abdomen pelvis dated 07/28/2023. FINDINGS: Lower chest: Partially visualized bilateral pleural effusions, right greater than left with associated partial compressive atelectasis of the lower lobes versus pneumonia. This is new since the prior CT. There is 3 vessel coronary vascular calcification. The tip of a central venous line noted at the cavoatrial junction. Tiny pockets of air adjacent  to the liver, likely introduced via catheters. There is a  small ascites new or significantly progressed since the prior CT. Hepatobiliary: Irregular liver contour suggestive of cirrhosis. Cholecystectomy. There is a 5.5 x 10.5 cm complex collection containing air at the cholecystectomy bed consistent with an abscess. Percutaneous transhepatic internal/external drain with tip in the region of the second portion of the duodenum and a percutaneous subhepatic drainage catheter with tip in the collection/abscess noted. Pancreas: The pancreas is moderately atrophic and poorly visualized. Spleen: Normal in size without focal abnormality. Adrenals/Urinary Tract: The adrenal glands unremarkable. Multiple nonobstructing bilateral renal calculi measure up to 5 mm in the interpolar right kidney. There is a 9 mm stone in the distal left ureter with mild left hydronephrosis. The right ureter and urinary bladder appear unremarkable. Stomach/Bowel: There is sigmoid diverticulosis and scattered colonic diverticula. There is postsurgical changes of gastric bypass. There is a small hiatal hernia. There is no bowel obstruction. Vascular/Lymphatic: Moderate aortoiliac atherosclerotic disease. The IVC is unremarkable. No portal venous gas. There is no adenopathy. Reproductive: The prostate and seminal vesicles are grossly unremarkable. Other: There is diffuse subcutaneous edema and anasarca. Musculoskeletal: Osteopenia with degenerative changes. No acute osseous pathology. IMPRESSION: 1. Status post cholecystectomy with a 5.5 x 10.5 cm abscess. Percutaneous drainage catheter with tip in the abscess. 2. Percutaneous transhepatic internal/external drain with tip in the region of the second portion of the duodenum. 3. Small ascites new or significantly progressed since the prior CT. 4. Partially visualized bilateral pleural effusions, right greater than left, new since the prior CT. 5. A 9 mm stone in the distal left ureter with mild left hydronephrosis. 6. Nonobstructing bilateral renal calculi.  7. Sigmoid diverticulosis. No bowel obstruction. 8.  Aortic Atherosclerosis (ICD10-I70.0). Electronically Signed   By: Elgie Collard M.D.   On: 08/15/2023 17:08   DG Shoulder Right Result Date: 08/14/2023 CLINICAL DATA:  Right shoulder pain for several days EXAM: RIGHT SHOULDER - 2+ VIEW COMPARISON:  None Available. FINDINGS: Internal rotation, external rotation, transscapular, and axillary views of the right shoulder are obtained on 5 images. No acute fracture, subluxation, or dislocation. Mild hypertrophic changes of the acromioclavicular joint. Soft tissues are unremarkable. Continued vascular congestion within the visualized right hemithorax. Right-sided PICC partially visualized. IMPRESSION: 1. Acromioclavicular joint osteoarthritis. 2. No acute bony abnormality. Electronically Signed   By: Sharlet Salina M.D.   On: 08/14/2023 17:25         Charisma Charlot M.D. [facility] 08/16/2023, 11:42 AM  Available via Epic secure chat 7am-7pm After 7 pm, please refer to night coverage provider listed on amion.

## 2023-08-16 NOTE — Progress Notes (Signed)
Nutrition Follow-up  DOCUMENTATION CODES:   Severe malnutrition in context of chronic illness  INTERVENTION:  Continue regular diet as ordered Ensure Enlive po BID, each supplement provides 350 kcal and 20 grams of protein. Juven BID to support wound healing Bariatric surgery vitamin regimen: MVI BID Hold TUMS TID as corrected Ca is WDL and pt does not like to take this supplement  NUTRITION DIAGNOSIS:   Severe Malnutrition related to chronic illness (gastric bypass) as evidenced by severe fat depletion, severe muscle depletion. - remains applicable  GOAL:   Patient will meet greater than or equal to 90% of their needs - progressing, addressing via meals and nutrition supplements  MONITOR:   PO intake, Supplement acceptance, Labs, Weight trends  REASON FOR ASSESSMENT:   Consult Assessment of nutrition requirement/status (hx roux-en-y)  ASSESSMENT:   Pt admitted with generalized weakness/fatigue x1 month, poor oral intake and R abdominal pain found to have acute cholecystitis. 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  1/5: admitted 1/8: s/p lap chole  1/14: s/p MRCP- revealed un-drained fluid collection that surgical drain traverses but is unable to completely evacuate; otherwise normal   1/22 s/p biliary exchange and perihepatic fluid collection aspiration with IR  CSW working on d/c dispo. Noted plans for possible d/c early next week.   Pt continues on a regular diet. Appears to be consuming 100% of most documented meals though some meal completions not documented.   Spoke with pt at bedside. He is in good spirits today. He states that he is eating well and really enjoys the food provided during admission. He endorses sipping slowly on one ensure daily. Juven he mixes with apple sauce and is tolerating well BID. Continues to try to consume protein containing foods and wanting more however when offered to order double protein  portions or protein containing snacks between meals, he declines and states that he would prefer to continue to consume nutrition supplements.   Pt recalls that he consumed 4 protein supplements daily in addition to low fat foods such as vegetables and salmon. He had requested protonix in place of TUMS as he has been on protonix since his gastric bypass in 2008. He does not enjoy the taste/foaminess of TUMS. Explained the TUMS is for calcium d/t limited absorption with roux-en-y however review of chart reflected corrected calcium is WDL.   Admit weight: 90.4 kg Current weight: 90.2 kg Highest weight during admission documented to be 99.5 kg on 1/19.  Medications: acidophilus, TUMS TID (pt refusing), colace BID, MVI BID, miralax  Labs:  Sodium 131 Alkaline phos 222 Corrected calcium 10.08 (WDL)  Drains/lines: RUQ biliary tube: 1.3L x24 hours R anterior abd JP drain: x24 hours RLQ lateral JP drain: 95ml x24 hours   Diet Order:   Diet Order             Diet regular Room service appropriate? Yes; Fluid consistency: Thin  Diet effective now                   EDUCATION NEEDS:   Not appropriate for education at this time  Skin:  Skin Assessment: Skin Integrity Issues: Skin Integrity Issues:: Stage II Stage II: R buttocks Incisions: closed abdominal incision  Last BM:  1/22  Height:   Ht Readings from Last 1 Encounters:  08/04/23 6\' 4"  (1.93 m)    Weight:   Wt Readings from Last 1 Encounters:  08/16/23 90.2 kg   BMI:  Body mass index is 24.21 kg/m.  Estimated Nutritional Needs:   Kcal:  2200-2400  Protein:  115-130g  Fluid:  >/=2L  Drusilla Kanner, RDN, LDN Clinical Nutrition

## 2023-08-17 DIAGNOSIS — K810 Acute cholecystitis: Secondary | ICD-10-CM | POA: Diagnosis not present

## 2023-08-17 LAB — COMPREHENSIVE METABOLIC PANEL
ALT: 23 U/L (ref 0–44)
AST: 13 U/L — ABNORMAL LOW (ref 15–41)
Albumin: 1.9 g/dL — ABNORMAL LOW (ref 3.5–5.0)
Alkaline Phosphatase: 191 U/L — ABNORMAL HIGH (ref 38–126)
Anion gap: 6 (ref 5–15)
BUN: 25 mg/dL — ABNORMAL HIGH (ref 8–23)
CO2: 28 mmol/L (ref 22–32)
Calcium: 8.6 mg/dL — ABNORMAL LOW (ref 8.9–10.3)
Chloride: 102 mmol/L (ref 98–111)
Creatinine, Ser: 0.56 mg/dL — ABNORMAL LOW (ref 0.61–1.24)
GFR, Estimated: >60 mL/min (ref >60)
Glucose, Bld: 98 mg/dL (ref 70–99)
Potassium: 4.2 mmol/L (ref 3.5–5.1)
Sodium: 136 mmol/L (ref 135–145)
Total Bilirubin: 0.7 mg/dL (ref 0.0–1.2)
Total Protein: 4.9 g/dL — ABNORMAL LOW (ref 6.5–8.1)

## 2023-08-17 LAB — CBC
HCT: 28.2 % — ABNORMAL LOW (ref 39.0–52.0)
Hemoglobin: 9.3 g/dL — ABNORMAL LOW (ref 13.0–17.0)
MCH: 30.8 pg (ref 26.0–34.0)
MCHC: 33 g/dL (ref 30.0–36.0)
MCV: 93.4 fL (ref 80.0–100.0)
Platelets: 263 10*3/uL (ref 150–400)
RBC: 3.02 MIL/uL — ABNORMAL LOW (ref 4.22–5.81)
RDW: 13.7 % (ref 11.5–15.5)
WBC: 17.1 10*3/uL — ABNORMAL HIGH (ref 4.0–10.5)
nRBC: 0 % (ref 0.0–0.2)

## 2023-08-17 NOTE — Progress Notes (Signed)
16 Days Post-Op  Subjective:  No new complaints.  Doing well  Objective: Vital signs in last 24 hours: Temp:  [97.5 F (36.4 C)-98.3 F (36.8 C)] 98.3 F (36.8 C) (01/24 0559) Pulse Rate:  [18-108] 107 (01/24 0559) Resp:  [12-24] 18 (01/24 0559) BP: (91-109)/(59-74) 109/69 (01/24 0559) SpO2:  [96 %-99 %] 96 % (01/24 0559) Weight:  [94.7 kg] 94.7 kg (01/24 0505) Last BM Date : 08/15/23  Intake/Output from previous day: 01/23 0701 - 01/24 0700 In: 540 [P.O.:540] Out: 3160 [Urine:1200; Drains:1960] Intake/Output this shift: No intake/output data recorded.  PE: Gen: NAD Heart: irregular, tachy in 120s Abd: soft, incisions c/d/I, PTC with bile in drain bag, 1870cc yesterday,  both perc drains with purulent output today, IR with 40cc and surgical with 50cc  Lab Results:  Recent Labs    08/16/23 0700 08/17/23 0554  WBC 19.2* 17.1*  HGB 9.3* 9.3*  HCT 28.0* 28.2*  PLT 266 263   BMET Recent Labs    08/16/23 0700 08/17/23 0554  NA 131* 136  K 4.0 4.2  CL 98 102  CO2 26 28  GLUCOSE 98 98  BUN 23 25*  CREATININE 0.63 0.56*  CALCIUM 8.4* 8.6*   PT/INR No results for input(s): "LABPROT", "INR" in the last 72 hours.  CMP     Component Value Date/Time   NA 136 08/17/2023 0554   K 4.2 08/17/2023 0554   CL 102 08/17/2023 0554   CO2 28 08/17/2023 0554   GLUCOSE 98 08/17/2023 0554   BUN 25 (H) 08/17/2023 0554   CREATININE 0.56 (L) 08/17/2023 0554   CREATININE 0.77 07/19/2023 1128   CALCIUM 8.6 (L) 08/17/2023 0554   PROT 4.9 (L) 08/17/2023 0554   ALBUMIN 1.9 (L) 08/17/2023 0554   AST 13 (L) 08/17/2023 0554   ALT 23 08/17/2023 0554   ALKPHOS 191 (H) 08/17/2023 0554   BILITOT 0.7 08/17/2023 0554   GFRNONAA >60 08/17/2023 0554   GFRAA  10/23/2009 0135    >60        The eGFR has been calculated using the MDRD equation. This calculation has not been validated in all clinical situations. eGFR's persistently <60 mL/min signify possible Chronic Kidney  Disease.   Lipase     Component Value Date/Time   LIPASE 29 07/29/2023 0014       Studies/Results: IR US Guide Bx Asp/Drain Result Date: 08/15/2023 INDICATION: 73 year old male with history of gangrenous cholecystitis status post partial cholecystectomy necessitating percutaneous biliary drain placement and biloma drain placement on 08/10/2023. Capping trial failed with significantly increased abdominal pain and sepsis. CT abdomen pelvis demonstrates malpositioned biliary drain and perihepatic fluid. EXAM: 1. Biliary drain check and exchange. 2. Percutaneous aspiration of perihepatic fluid. MEDICATIONS: Rocephin 2 gm IV; The antibiotic was administered within an appropriate time frame prior to the initiation of the procedure. ANESTHESIA/SEDATION: None. FLUOROSCOPY TIME:  24.5 mGy COMPLICATIONS: None immediate. PROCEDURE: Informed written consent was obtained from the patient after a thorough discussion of the procedural risks, benefits and alternatives. All questions were addressed. Maximal Sterile Barrier Technique was utilized including caps, mask, sterile gowns, sterile gloves, sterile drape, hand hygiene and skin antiseptic. A timeout was performed prior to the initiation of the procedure. Procedure scout radiograph of the right upper quadrant demonstrated kinking about the perihepatic aspect of the indwelling biliary drain with the radiopaque side hole near the periphery of the liver. Subdermal Local anesthesia was provided at the planned needle entry site with 1% lidocaine. Contrast  injection demonstrated patency of the drain, however there was spillage into the perihepatic space. No definite evidence of biliary ductal dilation. The external portion of the drain was cut to release inter pigtail. An Amplatz wire was then inserted. The drain was removed over the wire. A Kumpe the catheter was placed over the wire and directed into the duodenum. Hand injection of contrast via catheter demonstrated  intraluminal position. The Amplatz wire was then reinserted and the 5 French catheter was exchanged for a new, 12 [place], 40 cm biliary drain. The radiopaque marker was positioned just lateral to the hilum. The pigtail portion was formed in the duodenum. Hand injection of contrast demonstrated patency of the indwelling drain with persistent bile leak in the region of the proximal common bile duct. Ultrasound evaluation of the right upper quadrant demonstrated a small heterogeneously hypoechoic collection surrounding liver. Under direct ultrasound visualization, a 19 gauge, 7 cm Yueh catheter was directed into the collection. Aspiration was performed which showed yielded trace, thick bilious and bloody fluid. The fluid is very difficult to aspirate given gelatinous nature. The Yueh catheter was removed. The drain was affixed to the skin with an interrupted 0 silk suture. Sterile bandage was applied. The patient tolerated the procedure well was transferred back to the floor in good condition. IMPRESSION: 1. Technically successful exchange and repositioning of malpositioned indwelling 12 [place] biliary drain. 2. Attempted perihepatic fluid aspiration yielded trace thick bilious and bloody fluid. Given prior drain malposition, this fluid likely represents bilious leakage which should resolve spontaneously via indwelling drainage catheters. Marliss Coots, MD [facility] Electronically Signed   By: Marliss Coots M.D.   On: 08/15/2023 22:05   IR EXCHANGE BILIARY DRAIN Result Date: 08/15/2023 INDICATION: 73 year old male with history of gangrenous cholecystitis status post partial cholecystectomy necessitating percutaneous biliary drain placement and biloma drain placement on 08/10/2023. Capping trial failed with significantly increased abdominal pain and sepsis. CT abdomen pelvis demonstrates malpositioned biliary drain and perihepatic fluid. EXAM: 1. Biliary  drain check and exchange. 2. Percutaneous aspiration of perihepatic fluid. MEDICATIONS: Rocephin 2 gm IV; The antibiotic was administered within an appropriate time frame prior to the initiation of the procedure. ANESTHESIA/SEDATION: None. FLUOROSCOPY TIME:  24.5 mGy COMPLICATIONS: None immediate. PROCEDURE: Informed written consent was obtained from the patient after a thorough discussion of the procedural risks, benefits and alternatives. All questions were addressed. Maximal Sterile Barrier Technique was utilized including caps, mask, sterile gowns, sterile gloves, sterile drape, hand hygiene and skin antiseptic. A timeout was performed prior to the initiation of the procedure. Procedure scout radiograph of the right upper quadrant demonstrated kinking about the perihepatic aspect of the indwelling biliary drain with the radiopaque side hole near the periphery of the liver. Subdermal Local anesthesia was provided at the planned needle entry site with 1% lidocaine. Contrast injection demonstrated patency of the drain, however there was spillage into the perihepatic space. No definite evidence of biliary ductal dilation. The external portion of the drain was cut to release inter pigtail. An Amplatz wire was then inserted. The drain was removed over the wire. A Kumpe the catheter was placed over the wire and directed into the duodenum. Hand injection of contrast via catheter demonstrated intraluminal position. The Amplatz wire was then reinserted and the 5 French catheter was exchanged for a new, 12 [place], 40 cm biliary drain. The radiopaque marker was positioned just lateral to the hilum. The pigtail portion was formed in the duodenum. Hand injection of  contrast demonstrated patency of the indwelling drain with persistent bile leak in the region of the proximal common bile duct. Ultrasound evaluation of the right upper quadrant demonstrated a small heterogeneously hypoechoic collection surrounding liver. Under  direct ultrasound visualization, a 19 gauge, 7 cm Yueh catheter was directed into the collection. Aspiration was performed which showed yielded trace, thick bilious and bloody fluid. The fluid is very difficult to aspirate given gelatinous nature. The Yueh catheter was removed. The drain was affixed to the skin with an interrupted 0 silk suture. Sterile bandage was applied. The patient tolerated the procedure well was transferred back to the floor in good condition. IMPRESSION: 1. Technically successful exchange and repositioning of malpositioned indwelling 12 [place] biliary drain. 2. Attempted perihepatic fluid aspiration yielded trace thick bilious and bloody fluid. Given prior drain malposition, this fluid likely represents bilious leakage which should resolve spontaneously via indwelling drainage catheters. Marliss Coots, MD [facility] Electronically Signed   By: Marliss Coots M.D.   On: 08/15/2023 22:05   CT ABDOMEN PELVIS W CONTRAST Result Date: 08/15/2023 CLINICAL DATA:  Intra-abdominal abscess. EXAM: CT ABDOMEN AND PELVIS WITH CONTRAST TECHNIQUE: Multidetector CT imaging of the abdomen and pelvis was performed using the standard protocol following bolus administration of intravenous contrast. RADIATION DOSE REDUCTION: This exam was performed according to the departmental dose-optimization program which includes automated exposure control, adjustment of the mA and/or kV according to patient size and/or use of iterative reconstruction technique. CONTRAST:  75mL OMNIPAQUE IOHEXOL 350 MG/ML SOLN COMPARISON:  CT of the abdomen pelvis dated 07/28/2023. FINDINGS: Lower chest: Partially visualized bilateral pleural effusions, right greater than left with associated partial compressive atelectasis of the lower lobes versus pneumonia. This is new since the prior CT. There is 3 vessel coronary vascular calcification. The tip of a central venous line noted at  the cavoatrial junction. Tiny pockets of air adjacent to the liver, likely introduced via catheters. There is a small ascites new or significantly progressed since the prior CT. Hepatobiliary: Irregular liver contour suggestive of cirrhosis. Cholecystectomy. There is a 5.5 x 10.5 cm complex collection containing air at the cholecystectomy bed consistent with an abscess. Percutaneous transhepatic internal/external drain with tip in the region of the second portion of the duodenum and a percutaneous subhepatic drainage catheter with tip in the collection/abscess noted. Pancreas: The pancreas is moderately atrophic and poorly visualized. Spleen: Normal in size without focal abnormality. Adrenals/Urinary Tract: The adrenal glands unremarkable. Multiple nonobstructing bilateral renal calculi measure up to 5 mm in the interpolar right kidney. There is a 9 mm stone in the distal left ureter with mild left hydronephrosis. The right ureter and urinary bladder appear unremarkable. Stomach/Bowel: There is sigmoid diverticulosis and scattered colonic diverticula. There is postsurgical changes of gastric bypass. There is a small hiatal hernia. There is no bowel obstruction. Vascular/Lymphatic: Moderate aortoiliac atherosclerotic disease. The IVC is unremarkable. No portal venous gas. There is no adenopathy. Reproductive: The prostate and seminal vesicles are grossly unremarkable. Other: There is diffuse subcutaneous edema and anasarca. Musculoskeletal: Osteopenia with degenerative changes. No acute osseous pathology. IMPRESSION: 1. Status post cholecystectomy with a 5.5 x 10.5 cm abscess. Percutaneous drainage catheter with tip in the abscess. 2. Percutaneous transhepatic internal/external drain with tip in the region of the second portion of the duodenum. 3. Small ascites new or significantly progressed since the prior CT. 4. Partially visualized bilateral pleural effusions, right greater than left, new since the prior CT. 5. A  9  mm stone in the distal left ureter with mild left hydronephrosis. 6. Nonobstructing bilateral renal calculi. 7. Sigmoid diverticulosis. No bowel obstruction. 8.  Aortic Atherosclerosis (ICD10-I70.0). Electronically Signed   By: Elgie Collard M.D.   On: 08/15/2023 17:08      Anti-infectives: Anti-infectives (From admission, onward)    Start     Dose/Rate Route Frequency Ordered Stop   08/15/23 1615  cefTRIAXone (ROCEPHIN) 2 g in sodium chloride 0.9 % 100 mL IVPB        2 g 200 mL/hr over 30 Minutes Intravenous To Radiology 08/15/23 1525 08/15/23 1643   08/13/23 0800  meropenem (MERREM) 1 g in sodium chloride 0.9 % 100 mL IVPB        1 g 200 mL/hr over 30 Minutes Intravenous Every 8 hours 08/13/23 0714     08/09/23 1400  cefTRIAXone (ROCEPHIN) 2 g in sodium chloride 0.9 % 100 mL IVPB  Status:  Discontinued        2 g 200 mL/hr over 30 Minutes Intravenous To Radiology 08/09/23 1254 08/10/23 0716   08/09/23 1245  cefTRIAXone (ROCEPHIN) 2 g in sodium chloride 0.9 % 100 mL IVPB  Status:  Discontinued       Note to Pharmacy: Please do not give on floor. To be given in IR.   2 g 200 mL/hr over 30 Minutes Intravenous To Radiology 08/09/23 1152 08/09/23 1213   08/07/23 1100  piperacillin-tazobactam (ZOSYN) IVPB 3.375 g  Status:  Discontinued        3.375 g 12.5 mL/hr over 240 Minutes Intravenous Every 8 hours 08/07/23 1008 08/13/23 0714   08/07/23 0000  piperacillin-tazobactam (ZOSYN) 3.375 GM/50ML IVPB        3.375 g Intravenous Every 8 hours 08/07/23 1501     07/29/23 0800  piperacillin-tazobactam (ZOSYN) IVPB 3.375 g        3.375 g 12.5 mL/hr over 240 Minutes Intravenous Every 8 hours 07/29/23 0207 08/06/23 2359   07/29/23 0030  piperacillin-tazobactam (ZOSYN) IVPB 3.375 g        3.375 g 100 mL/hr over 30 Minutes Intravenous  Once 07/29/23 0020 07/29/23 0147        Assessment/Plan POD 16, s/p subtotal cholecystectomy for gangrenous cholecystitis, Dr. Hillery Hunter 1/8 -regular  diet -cont abx therapy, but switch to Merrem as E coli in cultures is resistant to zosyn.  WBC 17K today, AF.   -IR procedure reviewed.  Will continue to leave PTC to gravity for today, but can consider recapping this weekend or early next week.  Labs are continuing to improve. -cont JP drains as they are continuing to drain this subhepatic fluid collection -Eliquis restarted -will follow up with Dr. Hillery Hunter and IR as outpatient.   - I contacted [name], [facility], and [facility] yesterday on behalf of the patient and his request to transfer as well as need for possible transfer due to ERCP difficulties in the setting of bypass anatomy. -[name] declined due to capacity.  [facility] was reviewing his case and the patient then refused this location so request for transfer was withdrawn.  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  They also have no beds and unclear at what point that may change to even accept the patient.  Their recommendation was to have IR place a PTC drain so this stents his CBD and essentially accomplishes the same goal in a safe technique.  FEN -regular VTE - Eliquis ID - Zosyn, changed to merrem on 1/20  A fib AKI Peripheral neuropathy GERD HLD Hx of gastric bypass   LOS: 19 days    Letha Cape , [facility] 08/17/2023, 8:02 AM Please see Amion for pager number during day hours 7:00am-4:30pm or 7:00am -11:30am on weekends

## 2023-08-17 NOTE — Progress Notes (Signed)
Mobility Specialist Progress Note:   08/17/23 1445  Mobility  Activity Ambulated with assistance in room  Level of Assistance Minimal assist, patient does 75% or more  Assistive Device Front wheel walker  Distance Ambulated (ft) 40 ft  Activity Response Tolerated fair (soft BP)  Mobility Referral Yes  Mobility visit 1 Mobility  Mobility Specialist Start Time (ACUTE ONLY) 1445  Mobility Specialist Stop Time (ACUTE ONLY) 1510  Mobility Specialist Time Calculation (min) (ACUTE ONLY) 25 min   Pt session limited by soft BP and minor dizziness which pt states is all the time. Required minA fro bed mobility, and to stand d/t posterior lean. CGA given for gait training. Immediately post-ambulation, sitting on EOB, BP: 78/57 (65). Pt returned to supine with BP of: 86/72 (77). ~5 min of reverse trendelenburg, BP recovered to: 91/71 (78). RN in to assess. Pt left in bed with all needs met, alarm on.  Addison Lank Mobility Specialist Please contact via SecureChat or  Rehab office at [phone]

## 2023-08-17 NOTE — Plan of Care (Signed)
  Problem: Clinical Measurements: Goal: Respiratory complications will improve Outcome: Progressing Goal: Cardiovascular complication will be avoided Outcome: Progressing   Problem: Activity: Goal: Risk for activity intolerance will decrease Outcome: Progressing   Problem: Nutrition: Goal: Adequate nutrition will be maintained Outcome: Progressing   Problem: Elimination: Goal: Will not experience complications related to bowel motility Outcome: Progressing   Problem: Safety: Goal: Ability to remain free from injury will improve Outcome: Progressing   Problem: Skin Integrity: Goal: Risk for impaired skin integrity will decrease Outcome: Progressing

## 2023-08-17 NOTE — Plan of Care (Signed)

## 2023-08-17 NOTE — Progress Notes (Signed)
PT Cancellation Note  Patient Details Name: ARLEY SALAMONE MRN: [id number] DOB: [birth year]   Cancelled Treatment:    Reason Eval/Treat Not Completed: Pain limiting ability to participate. Pt declined PT session citing 8/10 generalized pain and stated he missed a dose of pain medication, so he won't be able to tolerate OOB mobility.   Cheri Guppy, PT, DPT Acute Rehabilitation Services Office: [phone] Secure Chat Preferred  Richardson Chiquito 08/17/2023, 11:35 AM

## 2023-08-17 NOTE — Progress Notes (Signed)
[facility]                                                                              Francisco Padilla, is a 73 y.o. male, DOB - 04/26/1951, WUJ:[id number] Admit date - 07/28/2023    Outpatient Primary MD for the patient is [name], MD  LOS - 19  days  Chief Complaint  Patient presents with   Weakness       Brief summary   Patient is a 73 year old male with pertinent PMH OSA on CPAP, HLD, chronic hypotension, PAF on Eliquis, h/o bariatric surgery, PVD presents to [facility] ED on 1/5 with cholecystitis. On arrival patient noted to be slightly tachycardic 100s and mildly hypotensive 93/73.  CT ABD/pelvis showing acute cholecystitis.  Surgery consulted.  Given IV fluids and Zosyn.  Awaiting surgery 48 hours for Eliquis washout. Cards consulted for preoperative evaluation of cholecystectomy. Echo on 1/7 showing lvef 60-65%; severe aortic stenosis.  On 1/8 patient taken to the OR for cholecystectomy showing gangrenous cholecystitis.   While in the OR patient had tachycardia 150s and started on esmolol.  Patient later became hypotensive and started on pressers and put in the ICU. Started on IV Abx and had percutaneous abscess drainage by IR 1/17. 1/10 episode of afib RVR and hypotension.    1/17: Image guided drain into subhepatic abscess, PTC 1/20: PTC capped by IR, LFTs normal 1/22: Right-sided abdominal pain, reconnected PTC, CT abdomen   Assessment & Plan    Principal Problem:   Acute cholecystitis Gangrenous cholecystitis, complicated with sepsis status post subtotal cholecystectomy by Dr. Hillery Hunter on 1/8  -Postop ICU care due to tachycardia requiring esmolol and subsequent hypotension/shock requiring vasopressors, A-fib with RVR. -MRCP: undrained fluid collection that the surgical drain traverses but unable to completely evacuate -IR following, s/p image guided drain into the subhepatic abscess and PTC on 1/17 (Dr. Loreta Ave) -Cultures + E. coli, resistant to Zosyn,  changed to IV meropenem on 08/13/2023 -Per IR, would not place metal stent for the bile leak and recommended to continue drain.  PTC initially capped by IR on 1/20.  However on 1/21, patient had right-sided abdominal pain/shoulder pain, elevated leukocytosis, alk phos, PTC reconnected to drain.  -Continue JP drains for the subhepatic fluid collection -Eliquis was resumed on 1/20 anticipating no further procedures -Surgery, IR following -Leukocytosis improving   Chronic hypotension -Initially placed on Solu-Cortef, stress dose steroids have been discontinued, patient has been chronically on Florinef for hypotension. -B12, folate normal, a.m. cortisol also normal 15.0 -Has received IV albumin -BP borderline, asymptomatic,  - continue midodrine 15 mg 3 times daily -Follow B1  AKI (acute kidney injury) (HCC)- resolved -Creatinine stable  Hyponatremia- resolved -Asymptomatic, continue to follow   Aortic stenosis, severe Stable, no signs of acute decompensation   Atrial fibrillation with RVR (HCC) -Rate controlled -Continue midodrine for BP -Continue eliquis   History of Roux-en-Y gastric bypass Follow up as outpatient.    OSA on CPAP Continue Home Cpap.    Pressure injury documentation Right buttocks, stage I, Not present on admission -Nursing care  Right shoulder pain -Likely referred from right sided abdomen.  Shoulder  x-ray showed osteoarthritis     Severe calorie malnutrition Nutrition Problem: Severe Malnutrition Etiology: chronic illness (gastric bypass) Signs/Symptoms: severe fat depletion, severe muscle depletion Interventions: Ensure Enlive (each supplement provides 350kcal and 20 grams of protein), MVI, Carnation Instant Breakfast  Estimated body mass index is 25.41 kg/m as calculated from the following:   Height as of this encounter: 6\' 4"  (1.93 m).   Weight as of this encounter: 94.7 kg.  Code Status: Full code DVT Prophylaxis:  SCDs Start: 07/29/23  0131 Place TED hose Start: 07/29/23 0131 apixaban (ELIQUIS) tablet 5 mg   Level of Care: Level of care: Telemetry Cardiac Family Communication: Updated patient' Disposition Plan:      Remains inpatient appropriate: PT evaluation recommended SNF, TOC assisting   Procedures:  08/01/2023 subtotal cholecystectomy (Dr Melody Haver, general surgery) 1/17: Image guided drain into subhepatic abscess, PTC  Consultants:   Cardiology General Surgery Critical care IR  Antimicrobials:   Anti-infectives (From admission, onward)    Start     Dose/Rate Route Frequency Ordered Stop   08/15/23 1615  cefTRIAXone (ROCEPHIN) 2 g in sodium chloride 0.9 % 100 mL IVPB        2 g 200 mL/hr over 30 Minutes Intravenous To Radiology 08/15/23 1525 08/15/23 1643   08/13/23 0800  meropenem (MERREM) 1 g in sodium chloride 0.9 % 100 mL IVPB        1 g 200 mL/hr over 30 Minutes Intravenous Every 8 hours 08/13/23 0714     08/09/23 1400  cefTRIAXone (ROCEPHIN) 2 g in sodium chloride 0.9 % 100 mL IVPB  Status:  Discontinued        2 g 200 mL/hr over 30 Minutes Intravenous To Radiology 08/09/23 1254 08/10/23 0716   08/09/23 1245  cefTRIAXone (ROCEPHIN) 2 g in sodium chloride 0.9 % 100 mL IVPB  Status:  Discontinued       Note to Pharmacy: Please do not give on floor. To be given in IR.   2 g 200 mL/hr over 30 Minutes Intravenous To Radiology 08/09/23 1152 08/09/23 1213   08/07/23 1100  piperacillin-tazobactam (ZOSYN) IVPB 3.375 g  Status:  Discontinued        3.375 g 12.5 mL/hr over 240 Minutes Intravenous Every 8 hours 08/07/23 1008 08/13/23 0714   08/07/23 0000  piperacillin-tazobactam (ZOSYN) 3.375 GM/50ML IVPB        3.375 g Intravenous Every 8 hours 08/07/23 1501     07/29/23 0800  piperacillin-tazobactam (ZOSYN) IVPB 3.375 g        3.375 g 12.5 mL/hr over 240 Minutes Intravenous Every 8 hours 07/29/23 0207 08/06/23 2359   07/29/23 0030  piperacillin-tazobactam (ZOSYN) IVPB 3.375 g        3.375 g 100  mL/hr over 30 Minutes Intravenous  Once 07/29/23 0020 07/29/23 0147          Medications  acetaminophen  1,000 mg Oral Q6H   Or   acetaminophen  650 mg Rectal Q6H   acidophilus  1 capsule Oral Daily   alfuzosin  10 mg Oral Q breakfast   apixaban  5 mg Oral BID   Chlorhexidine Gluconate Cloth  6 each Topical Daily   docusate sodium  100 mg Oral BID   feeding supplement  237 mL Oral BID BM   fludrocortisone  0.1 mg Oral Daily   gabapentin  600 mg Oral QHS   latanoprost  1 drop Both Eyes QHS   midodrine  15 mg Oral TID WC  multivitamin with minerals  1 tablet Oral BID   nutrition supplement (JUVEN)  1 packet Oral BID BM   pantoprazole  40 mg Oral BID   polyethylene glycol  17 g Oral Daily   sodium chloride flush  3 mL Intravenous Q12H   sodium chloride flush  5 mL Intracatheter Q8H   venlafaxine XR  37.5 mg Oral Q breakfast   zolpidem  10 mg Oral QHS      Subjective:   Francisco Padilla reports doing well today. He has some abdominal and back pain that radiates up to R should which is unchanged from last several days. Well controlled when he gets his pain medications.   Objective:   Vitals:   08/17/23 0505 08/17/23 0556 08/17/23 0559 08/17/23 0805  BP:  93/66 109/69 (!) 88/63  Pulse:  100 (!) 107 (!) 105  Resp: (!) 24 12 18  (!) 24  Temp:   98.3 F (36.8 C) 98.3 F (36.8 C)  TempSrc:   Oral Oral  SpO2:  97% 96%   Weight: 94.7 kg     Height:        Intake/Output Summary (Last 24 hours) at 08/17/2023 0808 Last data filed at 08/17/2023 0545 Gross per 24 hour  Intake 540 ml  Output 3160 ml  Net -2620 ml     Wt Readings from Last 3 Encounters:  08/17/23 94.7 kg  07/19/23 92.4 kg  06/14/23 96.9 kg   Physical Exam General: Alert and oriented x 3, NAD Cardiovascular: S1 S2 clear, RRR.  Respiratory: CTAB, no wheezing, rales or rhonchi Gastrointestinal: Soft, ND, NBS + 3 drains (2 JP drain, PTC) Ext: no pedal edema bilaterally Neuro: no new deficits Psych: Normal  affect   Data Reviewed:  I have personally reviewed following labs    CBC Lab Results  Component Value Date   WBC 17.1 (H) 08/17/2023   RBC 3.02 (L) 08/17/2023   HGB 9.3 (L) 08/17/2023   HCT 28.2 (L) 08/17/2023   MCV 93.4 08/17/2023   MCH 30.8 08/17/2023   PLT 263 08/17/2023   MCHC 33.0 08/17/2023   RDW 13.7 08/17/2023   LYMPHSABS 0.7 08/16/2023   MONOABS 1.1 (H) 08/16/2023   EOSABS 0.1 08/16/2023   BASOSABS 0.0 08/16/2023     Last metabolic panel Lab Results  Component Value Date   NA 136 08/17/2023   K 4.2 08/17/2023   CL 102 08/17/2023   CO2 28 08/17/2023   BUN 25 (H) 08/17/2023   CREATININE 0.56 (L) 08/17/2023   GLUCOSE 98 08/17/2023   GFRNONAA >60 08/17/2023   GFRAA  10/23/2009    >60        The eGFR has been calculated using the MDRD equation. This calculation has not been validated in all clinical situations. eGFR's persistently <60 mL/min signify possible Chronic Kidney Disease.   CALCIUM 8.6 (L) 08/17/2023   PROT 4.9 (L) 08/17/2023   ALBUMIN 1.9 (L) 08/17/2023   BILITOT 0.7 08/17/2023   ALKPHOS 191 (H) 08/17/2023   AST 13 (L) 08/17/2023   ALT 23 08/17/2023   ANIONGAP 6 08/17/2023    CBG (last 3)  No results for input(s): "GLUCAP" in the last 72 hours.     Coagulation Profile: No results for input(s): "INR", "PROTIME" in the last 168 hours.     Radiology Studies: I have personally reviewed the imaging studies  IR US Guide Bx Asp/Drain Result Date: 08/15/2023 INDICATION: 73 year old male with history of gangrenous cholecystitis status post partial cholecystectomy necessitating percutaneous  biliary drain placement and biloma drain placement on 08/10/2023. Capping trial failed with significantly increased abdominal pain and sepsis. CT abdomen pelvis demonstrates malpositioned biliary drain and perihepatic fluid. EXAM: 1. Biliary drain check and exchange. 2. Percutaneous aspiration of perihepatic fluid. MEDICATIONS: Rocephin 2 gm IV; The  antibiotic was administered within an appropriate time frame prior to the initiation of the procedure. ANESTHESIA/SEDATION: None. FLUOROSCOPY TIME:  24.5 mGy COMPLICATIONS: None immediate. PROCEDURE: Informed written consent was obtained from the patient after a thorough discussion of the procedural risks, benefits and alternatives. All questions were addressed. Maximal Sterile Barrier Technique was utilized including caps, mask, sterile gowns, sterile gloves, sterile drape, hand hygiene and skin antiseptic. A timeout was performed prior to the initiation of the procedure. Procedure scout radiograph of the right upper quadrant demonstrated kinking about the perihepatic aspect of the indwelling biliary drain with the radiopaque side hole near the periphery of the liver. Subdermal Local anesthesia was provided at the planned needle entry site with 1% lidocaine. Contrast injection demonstrated patency of the drain, however there was spillage into the perihepatic space. No definite evidence of biliary ductal dilation. The external portion of the drain was cut to release inter pigtail. An Amplatz wire was then inserted. The drain was removed over the wire. A Kumpe the catheter was placed over the wire and directed into the duodenum. Hand injection of contrast via catheter demonstrated intraluminal position. The Amplatz wire was then reinserted and the 5 French catheter was exchanged for a new, 12 [place], 40 cm biliary drain. The radiopaque marker was positioned just lateral to the hilum. The pigtail portion was formed in the duodenum. Hand injection of contrast demonstrated patency of the indwelling drain with persistent bile leak in the region of the proximal common bile duct. Ultrasound evaluation of the right upper quadrant demonstrated a small heterogeneously hypoechoic collection surrounding liver. Under direct ultrasound visualization, a 19 gauge, 7 cm Yueh catheter was directed into the collection. Aspiration was  performed which showed yielded trace, thick bilious and bloody fluid. The fluid is very difficult to aspirate given gelatinous nature. The Yueh catheter was removed. The drain was affixed to the skin with an interrupted 0 silk suture. Sterile bandage was applied. The patient tolerated the procedure well was transferred back to the floor in good condition. IMPRESSION: 1. Technically successful exchange and repositioning of malpositioned indwelling 12 [place] biliary drain. 2. Attempted perihepatic fluid aspiration yielded trace thick bilious and bloody fluid. Given prior drain malposition, this fluid likely represents bilious leakage which should resolve spontaneously via indwelling drainage catheters. Marliss Coots, MD [facility] Radiology Electronically Signed   By: Marliss Coots M.D.   On: 08/15/2023 22:05   IR EXCHANGE BILIARY DRAIN Result Date: 08/15/2023 INDICATION: 73 year old male with history of gangrenous cholecystitis status post partial cholecystectomy necessitating percutaneous biliary drain placement and biloma drain placement on 08/10/2023. Capping trial failed with significantly increased abdominal pain and sepsis. CT abdomen pelvis demonstrates malpositioned biliary drain and perihepatic fluid. EXAM: 1. Biliary drain check and exchange. 2. Percutaneous aspiration of perihepatic fluid. MEDICATIONS: Rocephin 2 gm IV; The antibiotic was administered within an appropriate time frame prior to the initiation of the procedure. ANESTHESIA/SEDATION: None. FLUOROSCOPY TIME:  24.5 mGy COMPLICATIONS: None immediate. PROCEDURE: Informed written consent was obtained from the patient after a thorough discussion of the procedural risks, benefits and alternatives. All questions were addressed. Maximal Sterile Barrier Technique was utilized including caps, mask, sterile gowns, sterile gloves, sterile drape,  hand hygiene and skin antiseptic. A timeout was performed  prior to the initiation of the procedure. Procedure scout radiograph of the right upper quadrant demonstrated kinking about the perihepatic aspect of the indwelling biliary drain with the radiopaque side hole near the periphery of the liver. Subdermal Local anesthesia was provided at the planned needle entry site with 1% lidocaine. Contrast injection demonstrated patency of the drain, however there was spillage into the perihepatic space. No definite evidence of biliary ductal dilation. The external portion of the drain was cut to release inter pigtail. An Amplatz wire was then inserted. The drain was removed over the wire. A Kumpe the catheter was placed over the wire and directed into the duodenum. Hand injection of contrast via catheter demonstrated intraluminal position. The Amplatz wire was then reinserted and the 5 French catheter was exchanged for a new, 12 [place], 40 cm biliary drain. The radiopaque marker was positioned just lateral to the hilum. The pigtail portion was formed in the duodenum. Hand injection of contrast demonstrated patency of the indwelling drain with persistent bile leak in the region of the proximal common bile duct. Ultrasound evaluation of the right upper quadrant demonstrated a small heterogeneously hypoechoic collection surrounding liver. Under direct ultrasound visualization, a 19 gauge, 7 cm Yueh catheter was directed into the collection. Aspiration was performed which showed yielded trace, thick bilious and bloody fluid. The fluid is very difficult to aspirate given gelatinous nature. The Yueh catheter was removed. The drain was affixed to the skin with an interrupted 0 silk suture. Sterile bandage was applied. The patient tolerated the procedure well was transferred back to the floor in good condition. IMPRESSION: 1. Technically successful exchange and repositioning of malpositioned indwelling 12 [place] biliary drain. 2. Attempted perihepatic fluid aspiration yielded trace thick  bilious and bloody fluid. Given prior drain malposition, this fluid likely represents bilious leakage which should resolve spontaneously via indwelling drainage catheters. Marliss Coots, MD [facility] Radiology Electronically Signed   By: Marliss Coots M.D.   On: 08/15/2023 22:05   CT ABDOMEN PELVIS W CONTRAST Result Date: 08/15/2023 CLINICAL DATA:  Intra-abdominal abscess. EXAM: CT ABDOMEN AND PELVIS WITH CONTRAST TECHNIQUE: Multidetector CT imaging of the abdomen and pelvis was performed using the standard protocol following bolus administration of intravenous contrast. RADIATION DOSE REDUCTION: This exam was performed according to the departmental dose-optimization program which includes automated exposure control, adjustment of the mA and/or kV according to patient size and/or use of iterative reconstruction technique. CONTRAST:  75mL OMNIPAQUE IOHEXOL 350 MG/ML SOLN COMPARISON:  CT of the abdomen pelvis dated 07/28/2023. FINDINGS: Lower chest: Partially visualized bilateral pleural effusions, right greater than left with associated partial compressive atelectasis of the lower lobes versus pneumonia. This is new since the prior CT. There is 3 vessel coronary vascular calcification. The tip of a central venous line noted at the cavoatrial junction. Tiny pockets of air adjacent to the liver, likely introduced via catheters. There is a small ascites new or significantly progressed since the prior CT. Hepatobiliary: Irregular liver contour suggestive of cirrhosis. Cholecystectomy. There is a 5.5 x 10.5 cm complex collection containing air at the cholecystectomy bed consistent with an abscess. Percutaneous transhepatic internal/external drain with tip in the region of the second portion of the duodenum and a percutaneous subhepatic drainage catheter with tip in the collection/abscess noted. Pancreas: The pancreas is moderately atrophic and poorly visualized. Spleen:  Normal in size without focal abnormality. Adrenals/Urinary Tract: The adrenal glands unremarkable. Multiple  nonobstructing bilateral renal calculi measure up to 5 mm in the interpolar right kidney. There is a 9 mm stone in the distal left ureter with mild left hydronephrosis. The right ureter and urinary bladder appear unremarkable. Stomach/Bowel: There is sigmoid diverticulosis and scattered colonic diverticula. There is postsurgical changes of gastric bypass. There is a small hiatal hernia. There is no bowel obstruction. Vascular/Lymphatic: Moderate aortoiliac atherosclerotic disease. The IVC is unremarkable. No portal venous gas. There is no adenopathy. Reproductive: The prostate and seminal vesicles are grossly unremarkable. Other: There is diffuse subcutaneous edema and anasarca. Musculoskeletal: Osteopenia with degenerative changes. No acute osseous pathology. IMPRESSION: 1. Status post cholecystectomy with a 5.5 x 10.5 cm abscess. Percutaneous drainage catheter with tip in the abscess. 2. Percutaneous transhepatic internal/external drain with tip in the region of the second portion of the duodenum. 3. Small ascites new or significantly progressed since the prior CT. 4. Partially visualized bilateral pleural effusions, right greater than left, new since the prior CT. 5. A 9 mm stone in the distal left ureter with mild left hydronephrosis. 6. Nonobstructing bilateral renal calculi. 7. Sigmoid diverticulosis. No bowel obstruction. 8.  Aortic Atherosclerosis (ICD10-I70.0). Electronically Signed   By: Elgie Collard M.D.   On: 08/15/2023 17:08         Leeroy Bock M.D. [facility] 08/17/2023, 8:08 AM  Available via Epic secure chat 7am-7pm After 7 pm, please refer to night coverage provider listed on amion.

## 2023-08-17 NOTE — Progress Notes (Signed)
   08/17/23 0805  Assess: MEWS Score  Temp 98.3 F (36.8 C)  BP (!) 88/63  MAP (mmHg) 72  Pulse Rate (!) 105  ECG Heart Rate (!) 105  Resp (!) 24  Level of Consciousness Alert  SpO2 97 %  O2 Device Room Air  Assess: MEWS Score  MEWS Temp 0  MEWS Systolic 1  MEWS Pulse 1  MEWS RR 1  MEWS LOC 0  MEWS Score 3  MEWS Score Color Yellow  Assess: if the MEWS score is Yellow or Red  Were vital signs accurate and taken at a resting state? Yes  Does the patient meet 2 or more of the SIRS criteria? No  MEWS guidelines implemented  Yes, yellow  Treat  MEWS Interventions Considered administering scheduled or prn medications/treatments as ordered  Take Vital Signs  Increase Vital Sign Frequency  Yellow: Q2hr x1, continue Q4hrs until patient remains green for 12hrs  Escalate  MEWS: Escalate Yellow: Discuss with charge nurse and consider notifying provider and/or RRT  Assess: SIRS CRITERIA  SIRS Temperature  0  SIRS Respirations  1  SIRS Pulse 1  SIRS WBC 1  SIRS Score Sum  3   Patient with chronic hypotension. Schedule midodrine and flornief give.

## 2023-08-17 NOTE — Progress Notes (Cosign Needed Addendum)
Referring Provider(s): Marin Olp  Supervising Physician: Simonne Come  Patient Status:  [facility] - In-pt  Chief Complaint:  Follow up biliary drain  Brief History:  Francisco Padilla is a 73 y.o. male who initially presented with generalized weakness/fatigue for 1 month, nausea, diarrhea, poor appetite x 1 month and progressively worsening RUQ pain, particularly with inspiration.  CT A/P on 07/29/23 revealed cholelithiasis w/ gallbladder wall thickening and surrounding inflammation concerning for acute cholecystitis.  Surgery consulted for lap chole = patient on Eliquis so initially admitted to medicine for IV abx.   Subtotal cholecystectomy done 08/01/23 with Dr. Wilber Oliphant.   He had increased bilious output from JP drain.   MR Abdomen on 08/08/23 positive for bile leak.  Pt was considering transfer to [facility] or [facility] for continued care, but upon discussion with surgery team and personal friend who is a physician he became agreeable to stay for less invasive attempts at accessing biliary system.   He is status post ultrasound-guided subhepatic abscess drainage, as well as image guided percutaneous transhepatic cholangiogram with internal/external drainage by Dr. Loreta Ave on 08/10/23.  Capping trial on 08/13/23 per [facility] - failed and placed back to gravity 08/15/23.  He then had repositioning of malpositioned indwelling 12 [place] biliary drain and  attempted perihepatic fluid aspiration yielded trace thick bilious and bloody fluid by Dr. Elby Showers on 08/15/23.  Per Dr. Jerrye Noble note, given prior drain malposition, this fluid likely represents bilious leakage which should resolve spontaneously via indwelling drainage catheters.    Subjective:  Up out of bed working with PT. Reports "a whole lot" of output in the drain.  Allergies: Aspirin, Bupropion, Nsaids, and Oxycodone hcl  Medications: Prior to Admission medications   Medication Sig Start Date End Date Taking? Authorizing Provider   acetaminophen (TYLENOL) 650 MG CR tablet Take 1,300 mg by mouth every 8 (eight) hours.   Yes [provider]  alfuzosin (UROXATRAL) 10 MG 24 hr tablet Take 10 mg by mouth daily with breakfast.   Yes [provider]  amphetamine-dextroamphetamine (ADDERALL) 5 MG tablet Take 5 mg by mouth daily as needed (for ADD).   Yes [provider]  apixaban (ELIQUIS) 5 MG TABS tablet Take 5 mg by mouth 2 (two) times daily.   Yes [provider]  atorvastatin (LIPITOR) 20 MG tablet Take 20 mg by mouth at bedtime. 04/03/21  Yes [provider]  cetirizine (ZYRTEC) 10 MG tablet Take 1 tablet (10 mg total) by mouth daily. 06/14/23  Yes Ashok Croon, MD  Coenzyme Q10 (CO Q 10 PO) Take 1 capsule by mouth daily.   Yes [provider]  desvenlafaxine (PRISTIQ) 50 MG 24 hr tablet Take 50 mg by mouth daily. 02/05/23  Yes [provider]  fludrocortisone (FLORINEF) 0.1 MG tablet Take 0.1 mg by mouth daily. 03/07/19  Yes [provider]  fluticasone (FLONASE) 50 MCG/ACT nasal spray Place 2 sprays into both nostrils daily. 06/14/23  Yes Ashok Croon, MD  gabapentin (NEURONTIN) 300 MG capsule Take 2 capsules (600 mg total) by mouth at bedtime. 07/10/23 10/08/23 Yes Vivi Barrack, DPM  Glucosamine-Chondroitin (COSAMIN DS PO) Take 1,500 mg by mouth daily. Rotate with other Joint Health   Yes [provider]  latanoprost (XALATAN) 0.005 % ophthalmic solution Place 1 drop into both eyes at bedtime. 04/23/21  Yes [provider]  LORazepam (ATIVAN) 1 MG tablet Take 1 mg by mouth every 4 (four) hours as needed.   Yes [provider]  Melatonin 10 MG TABS Take 1 tablet by mouth at bedtime.   Yes [provider]  Misc Natural Products (OSTEO BI-FLEX ADV TRIPLE ST PO) Take 1 tablet by mouth daily.   Yes [provider]  Multiple Vitamins-Minerals (ONE DAILY MULTIVITAMIN MEN) TABS Take 1 tablet by mouth 2 (two)  times daily. CENTRUM SILVER 04/13/06  Yes [provider]  pantoprazole (PROTONIX) 40 MG tablet Take 40 mg by mouth in the morning and at bedtime. 10/24/09  Yes [provider]  Probiotic Product (PROBIOTIC PO) Take 1-2 capsules by mouth daily.   Yes [provider]  sildenafil (REVATIO) 20 MG tablet Take 20 mg by mouth daily as needed.   Yes [provider]  sodium chloride (OCEAN) 0.65 % SOLN nasal spray Place 1 spray into both nostrils as needed. 06/14/23  Yes Ashok Croon, MD  SUPER B COMPLEX/C PO Take 1 tablet by mouth daily.   Yes [provider]  zolpidem (AMBIEN) 10 MG tablet Take 5-10 mg by mouth at bedtime. 08/12/14  Yes [provider]  acetaminophen (TYLENOL) 325 MG tablet Take 2 tablets (650 mg total) by mouth every 4 (four) hours. 08/07/23   Arrien, York Ram, MD  acidophilus (RISAQUAD) CAPS capsule Take 1 capsule by mouth daily. 08/08/23   Arrien, York Ram, MD  calcium carbonate (TUMS - DOSED IN MG ELEMENTAL CALCIUM) 500 MG chewable tablet Chew 1 tablet (200 mg of elemental calcium total) by mouth 3 (three) times daily. 08/07/23   Arrien, York Ram, MD  docusate sodium (COLACE) 100 MG capsule Take 1 capsule (100 mg total) by mouth 2 (two) times daily. 08/07/23   Arrien, York Ram, MD  feeding supplement (ENSURE ENLIVE / ENSURE PLUS) LIQD Take 237 mLs by mouth 2 (two) times daily between meals. 08/07/23   Arrien, York Ram, MD  latanoprost (XALATAN) 0.005 % ophthalmic solution Place 1 drop into both eyes at bedtime. 08/07/23   Arrien, York Ram, MD  midodrine (PROAMATINE) 5 MG tablet Take 1 tablet (5 mg total) by mouth 3 (three) times daily with meals. 08/07/23   Arrien, York Ram, MD  piperacillin-tazobactam (ZOSYN) 3.375 GM/50ML IVPB Inject 50 mLs (3.375 g total) into the vein every 8 (eight) hours. 08/07/23   Arrien, York Ram, MD  polyethylene glycol (MIRALAX / GLYCOLAX) 17 g packet Take 17  g by mouth daily. 08/08/23   Arrien, York Ram, MD  senna-docusate (SENOKOT-S) 8.6-50 MG tablet Take 1 tablet by mouth at bedtime as needed for mild constipation. 08/07/23   Arrien, York Ram, MD  simethicone (MYLICON) 80 MG chewable tablet Chew 1 tablet (80 mg total) by mouth 4 (four) times daily as needed for flatulence. 08/07/23   Arrien, York Ram, MD  venlafaxine XR (EFFEXOR-XR) 37.5 MG 24 hr capsule Take 1 capsule (37.5 mg total) by mouth daily with breakfast. 08/08/23   Arrien, York Ram, MD     Vital Signs: BP (!) 88/63 (BP Location: Left Arm)   Pulse (!) 105   Temp 98.3 F (36.8 C) (Oral)   Resp (!) 24   Ht 6\' 4"  (1.93 m)   Wt 208 lb 12.4 oz (94.7 kg)   SpO2 97%   BMI 25.41 kg/m   Physical Exam Awake and alert NAD Standing at bedside with PT Drain Location: RUQ Size: Fr size: 12 Fr Date of placement: 08/10/23  Currently to: Drain collection device: gravity 24 hour output:  Output by Drain (mL) 08/15/23 0701 - 08/15/23 1900 08/15/23  1901 - 08/16/23 0700 08/16/23 0701 - 08/16/23 1900 08/16/23 1901 - 08/17/23 0700 08/17/23 0701 - 08/17/23 1524  Closed System Drain 1 Right;Lateral RLQ Bulb (JP) 19 Fr. 60 35 20 20 10   Closed System Drain 1 Right;Anterior Abdomen Bulb (JP) 10.2 Fr. 60 70 30 20 20   Biliary Tube Cook slip-coat 12 Fr. RUQ  1310 1250 620 125    Interval imaging/drain manipulation:  08/15/23 (biliary)  Current examination: Flushes/aspirates easily.  Insertion site unremarkable. Suture and stat lock in place. Dressed appropriately.   Labs:  CBC: Recent Labs    08/14/23 0540 08/15/23 0415 08/16/23 0700 08/17/23 0554  WBC 12.9* 27.3* 19.2* 17.1*  HGB 9.6* 10.0* 9.3* 9.3*  HCT 28.7* 30.3* 28.0* 28.2*  PLT 315 302 266 263    COAGS: Recent Labs    11/22/22 0955 12/07/22 1548 08/01/23 0303 08/01/23 2009 08/02/23 1955 08/03/23 0845 08/07/23 0930 08/08/23 0944 08/08/23 1622 08/09/23 1459  INR 1.2 1.1  --  1.3*  --   --    --   --   --  1.1  APTT  --   --    < > 40*   < > 39* 31 70* 96*  --    < > = values in this interval not displayed.    BMP: Recent Labs    08/14/23 0540 08/15/23 0415 08/16/23 0700 08/17/23 0554  NA 133* 134* 131* 136  K 3.7 4.2 4.0 4.2  CL 101 101 98 102  CO2 27 26 26 28   GLUCOSE 80 167* 98 98  BUN 19 21 23  25*  CALCIUM 8.3* 8.6* 8.4* 8.6*  CREATININE 0.62 0.73 0.63 0.56*  GFRNONAA >60 >60 >60 >60    LIVER FUNCTION TESTS: Recent Labs    08/14/23 0540 08/15/23 0415 08/16/23 0700 08/17/23 0554  BILITOT 0.8 0.9 0.8 0.7  AST 16 26 22  13*  ALT 16 26 32 23  ALKPHOS 81 164* 222* 191*  PROT 5.2* 4.9* 5.0* 4.9*  ALBUMIN 2.1* 2.0* 1.9* 1.9*    Assessment and Plan:  Cholecystitis  S/P image guided percutaneous transhepatic cholangiogram with internal/external drainage by Dr. Loreta Ave on 08/10/23.  He then had repositioning of malpositioned indwelling 12 [place] biliary drain and  attempted perihepatic fluid aspiration yielded trace thick bilious and bloody fluid by Dr. Elby Showers on 08/15/23.  Alk Phos 222 yesterday, 191 today. WBC 17.1 (down from 27.3)  Plan:  Continue flushes as ordered with 5 cc NS.  Record output Q shift.  Dressing changes QD or PRN if soiled.   Call IR APP or on call IR MD if difficulty flushing or sudden change in drain output.   Discharge planning:  Please contact IR APP or on call IR MD prior to patient d/c to ensure appropriate follow up plans are in place.   IR will continue to follow - please call with questions or concerns.  Electronically Signed: Gwynneth Macleod, PA-C 08/17/2023, 11:47 AM    I spent a total of 15 Minutes at the the patient's bedside AND on the patient's hospital floor or unit, greater than 50% of which was counseling/coordinating care for Biliary drain and abscess drain follow up.

## 2023-08-17 NOTE — Progress Notes (Signed)
   08/17/23 2214  BiPAP/CPAP/SIPAP  $ Non-Invasive Home Ventilator  Subsequent  BiPAP/CPAP/SIPAP Pt Type Adult  BiPAP/CPAP/SIPAP Resmed  Reason BIPAP/CPAP not in use Non-compliant

## 2023-08-18 ENCOUNTER — Inpatient Hospital Stay (HOSPITAL_COMMUNITY): Payer: Medicare Other

## 2023-08-18 DIAGNOSIS — K810 Acute cholecystitis: Secondary | ICD-10-CM | POA: Diagnosis not present

## 2023-08-18 LAB — COMPREHENSIVE METABOLIC PANEL
ALT: 78 U/L — ABNORMAL HIGH (ref 0–44)
AST: 109 U/L — ABNORMAL HIGH (ref 15–41)
Albumin: 1.7 g/dL — ABNORMAL LOW (ref 3.5–5.0)
Alkaline Phosphatase: 472 U/L — ABNORMAL HIGH (ref 38–126)
Anion gap: 8 (ref 5–15)
BUN: 21 mg/dL (ref 8–23)
CO2: 26 mmol/L (ref 22–32)
Calcium: 8.5 mg/dL — ABNORMAL LOW (ref 8.9–10.3)
Chloride: 101 mmol/L (ref 98–111)
Creatinine, Ser: 0.78 mg/dL (ref 0.61–1.24)
GFR, Estimated: >60 mL/min (ref >60)
Glucose, Bld: 99 mg/dL (ref 70–99)
Potassium: 3.9 mmol/L (ref 3.5–5.1)
Sodium: 135 mmol/L (ref 135–145)
Total Bilirubin: 3.3 mg/dL — ABNORMAL HIGH (ref 0.0–1.2)
Total Protein: 4.7 g/dL — ABNORMAL LOW (ref 6.5–8.1)

## 2023-08-18 LAB — CBC
HCT: 26.1 % — ABNORMAL LOW (ref 39.0–52.0)
Hemoglobin: 8.6 g/dL — ABNORMAL LOW (ref 13.0–17.0)
MCH: 30.7 pg (ref 26.0–34.0)
MCHC: 33 g/dL (ref 30.0–36.0)
MCV: 93.2 fL (ref 80.0–100.0)
Platelets: 234 10*3/uL (ref 150–400)
RBC: 2.8 MIL/uL — ABNORMAL LOW (ref 4.22–5.81)
RDW: 13.7 % (ref 11.5–15.5)
WBC: 13.2 10*3/uL — ABNORMAL HIGH (ref 4.0–10.5)
nRBC: 0 % (ref 0.0–0.2)

## 2023-08-18 LAB — VITAMIN B1: Vitamin B1 (Thiamine): 230.8 nmol/L — ABNORMAL HIGH (ref 66.5–200.0)

## 2023-08-18 MED ORDER — DOCUSATE SODIUM 100 MG PO CAPS
100.0000 mg | ORAL_CAPSULE | Freq: Two times a day (BID) | ORAL | Status: DC
  Administered 2023-08-18 – 2023-08-25 (×12): 100 mg via ORAL
  Filled 2023-08-18 (×14): qty 1

## 2023-08-18 NOTE — Plan of Care (Signed)
Problem: Activity: Goal: Risk for activity intolerance will decrease Outcome: Progressing   Problem: Nutrition: Goal: Adequate nutrition will be maintained Outcome: Progressing   Problem: Coping: Goal: Level of anxiety will decrease Outcome: Progressing   Problem: Safety: Goal: Ability to remain free from injury will improve Outcome: Progressing   Problem: Skin Integrity: Goal: Risk for impaired skin integrity will decrease Outcome: Progressing

## 2023-08-18 NOTE — Progress Notes (Signed)
17 Days Post-Op   Subjective/Chief Complaint: Patient feeling better today.  A little tired WBC improving Total bilirubin up significantly today to 3.3 from 0.7 yesterday PTC had 1345 cc output yesterday Other drains with minimal purulent output   Objective: Vital signs in last 24 hours: Temp:  [97.4 F (36.3 C)-98.4 F (36.9 C)] 98 F (36.7 C) (01/25 0840) Pulse Rate:  [65-105] 105 (01/25 0900) Resp:  [13-18] 18 (01/25 0840) BP: (78-98)/(55-69) 94/60 (01/25 0900) SpO2:  [98 %-99 %] 99 % (01/25 0840) Weight:  [95 kg] 95 kg (01/25 0324) Last BM Date : 08/15/23  Intake/Output from previous day: 01/24 0701 - 01/25 0700 In: 120 [P.O.:120] Out: 3505 [Urine:2100; Drains:1405] Intake/Output this shift: No intake/output data recorded.  Gen: NAD Heart: irregular Abd: soft, incisions c/d/I, PTC with bile in drain bag, 1345 cc yesterday,  both perc drains with purulent output today,   Lab Results:  Recent Labs    08/17/23 0554 08/18/23 0500  WBC 17.1* 13.2*  HGB 9.3* 8.6*  HCT 28.2* 26.1*  PLT 263 234   BMET Recent Labs    08/17/23 0554 08/18/23 0500  NA 136 135  K 4.2 3.9  CL 102 101  CO2 28 26  GLUCOSE 98 99  BUN 25* 21  CREATININE 0.56* 0.78  CALCIUM 8.6* 8.5*      Latest Ref Rng & Units 08/18/2023    5:00 AM 08/17/2023    5:54 AM 08/16/2023    7:00 AM  Hepatic Function  Total Protein 6.5 - 8.1 g/dL 4.7  4.9  5.0   Albumin 3.5 - 5.0 g/dL 1.7  1.9  1.9   AST 15 - 41 U/L 109  13  22   ALT 0 - 44 U/L 78  23  32   Alk Phosphatase 38 - 126 U/L 472  191  222   Total Bilirubin 0.0 - 1.2 mg/dL 3.3  0.7  0.8      Anti-infectives: Anti-infectives (From admission, onward)    Start     Dose/Rate Route Frequency Ordered Stop   08/15/23 1615  cefTRIAXone (ROCEPHIN) 2 g in sodium chloride 0.9 % 100 mL IVPB        2 g 200 mL/hr over 30 Minutes Intravenous To Radiology 08/15/23 1525 08/15/23 1643   08/13/23 0800  meropenem (MERREM) 1 g in sodium chloride 0.9 % 100  mL IVPB        1 g 200 mL/hr over 30 Minutes Intravenous Every 8 hours 08/13/23 0714     08/09/23 1400  cefTRIAXone (ROCEPHIN) 2 g in sodium chloride 0.9 % 100 mL IVPB  Status:  Discontinued        2 g 200 mL/hr over 30 Minutes Intravenous To Radiology 08/09/23 1254 08/10/23 0716   08/09/23 1245  cefTRIAXone (ROCEPHIN) 2 g in sodium chloride 0.9 % 100 mL IVPB  Status:  Discontinued       Note to Pharmacy: Please do not give on floor. To be given in IR.   2 g 200 mL/hr over 30 Minutes Intravenous To Radiology 08/09/23 1152 08/09/23 1213   08/07/23 1100  piperacillin-tazobactam (ZOSYN) IVPB 3.375 g  Status:  Discontinued        3.375 g 12.5 mL/hr over 240 Minutes Intravenous Every 8 hours 08/07/23 1008 08/13/23 0714   08/07/23 0000  piperacillin-tazobactam (ZOSYN) 3.375 GM/50ML IVPB        3.375 g Intravenous Every 8 hours 08/07/23 1501     07/29/23 0800  piperacillin-tazobactam (ZOSYN) IVPB 3.375 g        3.375 g 12.5 mL/hr over 240 Minutes Intravenous Every 8 hours 07/29/23 0207 08/06/23 2359   07/29/23 0030  piperacillin-tazobactam (ZOSYN) IVPB 3.375 g        3.375 g 100 mL/hr over 30 Minutes Intravenous  Once 07/29/23 0020 07/29/23 0147       Assessment/Plan: s/p subtotal cholecystectomy for gangrenous cholecystitis, Dr. Hillery Hunter 08/01/23 -regular diet -cont abx therapy, but switch to Merrem as E coli in cultures is resistant to zosyn.  WBC 17K today, AF.   -IR procedure reviewed.  Will continue to leave PTC to gravity for today - Rapid increase in total bilirubin is concerning for occlusion of drain vs. Primary hepatic dysfunction.  Will start with plain films to evaluate PTC for kinking or displacement.  May require contrast injection or revision of PTC.  Discussed with Dr. Deanne Coffer on call for IR. -cont JP drains as they are continuing to drain this subhepatic fluid collection -Eliquis restarted -will follow up with Dr. Hillery Hunter and IR as outpatient.     - I contacted [name],  [facility], and [facility] on behalf of the patient and his request to transfer as well as need for possible transfer due to ERCP difficulties in the setting of bypass anatomy. -[name] declined due to capacity.  [facility] was reviewing his case and the patient then refused this location so request for transfer was withdrawn.  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  They also have no beds and unclear at what point that may change to even accept the patient.  Their recommendation was to have IR place a PTC drain so this stents his CBD and essentially accomplishes the same goal in a safe technique.     FEN -regular VTE - Eliquis ID - Zosyn, changed to merrem on 1/20   A fib AKI Peripheral neuropathy GERD HLD Hx of gastric bypass  LOS: 20 days    Wynona Luna 08/18/2023

## 2023-08-18 NOTE — Progress Notes (Signed)
Mobility Specialist Progress Note:    08/18/23 1157  Therapy Vitals  Temp 98.7 F (37.1 C)  Pulse Rate 93  Resp 18  BP 94/68  Patient Position (if appropriate) Lying  Oxygen Therapy  SpO2 98 %  O2 Device Room Air  Mobility  Activity Transferred to/from [facility] (from [facility])  Level of Assistance Moderate assist, patient does 50-74% (+2)  Assistive Device Front wheel walker  Distance Ambulated (ft) 5 ft  Activity Response Tolerated fair  Mobility Referral Yes  Mobility visit 1 Mobility  Mobility Specialist Start Time (ACUTE ONLY) 1058  Mobility Specialist Stop Time (ACUTE ONLY) 1110  Mobility Specialist Time Calculation (min) (ACUTE ONLY) 12 min   Pt received on [facility], RN needing assistance getting pt back to bed. Pt required ModA +2 to stand and transfer back to bed. During transfer pt needed max verbal cues to straighten their legs. Pt was much weaker after getting on [facility]. Pt was able to swings legs in bed. Call bell and personal belongings in reach. All needs met RN in room.  Thompson Grayer Mobility Specialist  Please contact vis Secure Chat or  Rehab Office [phone]

## 2023-08-18 NOTE — Progress Notes (Addendum)
Mobility Specialist Progress Note:   08/18/23 0940  Mobility  Activity Ambulated with assistance in room  Level of Assistance Minimal assist, patient does 75% or more  Assistive Device Front wheel walker  Distance Ambulated (ft) 40 ft  Activity Response Tolerated well  Mobility Referral Yes  Mobility visit 1 Mobility  Mobility Specialist Start Time (ACUTE ONLY) [id number]  Mobility Specialist Stop Time (ACUTE ONLY) 0940  Mobility Specialist Time Calculation (min) (ACUTE ONLY) 22 min   Pt received in bed eager for mobility. Pt needed no physical assistance w/ bed mobility but MinA for STS and during ambulation d/t mild unsteadiness and a strong posterior lean. Requested to use the [facility] at [facility]. Situated on [facility] w/ call bell and personal belongings in reach. All needs met. RN aware.  Pre Mobility: BP 94/60 Post Mobility: 101/63  Thompson Grayer Mobility Specialist  Please contact vis Secure Chat or  Rehab Office [phone]

## 2023-08-18 NOTE — Progress Notes (Signed)
Progress Note    Francisco Padilla  GNF:[id number] DOB: [birth year]  DOA: 07/28/2023 PCP: [name], MD      Brief Narrative:    Medical records reviewed and are as summarized below:  Francisco Padilla is a 73 y.o. male  with pertinent PMH OSA on CPAP, HLD, chronic hypotension, PAF on Eliquis, h/o bariatric surgery, PVD presents to [facility] ED on 1/5 with cholecystitis. On arrival patient noted to be slightly tachycardic 100s and mildly hypotensive 93/73.  CT ABD/pelvis showing acute cholecystitis.  Surgery consulted.  Given IV fluids and Zosyn.  Awaiting surgery 48 hours for Eliquis washout. Cards consulted for preoperative evaluation of cholecystectomy. Echo on 1/7 showing lvef 60-65%; severe aortic stenosis.  On 1/8 patient taken to the OR for cholecystectomy showing gangrenous cholecystitis.   While in the OR patient had tachycardia 150s and started on esmolol.  Patient later became hypotensive and started on pressors and put in the ICU. Started on IV Abx and had percutaneous abscess drainage by IR 1/17. 1/10 episode of afib RVR and hypotension.    1/17: Image guided drain into subhepatic abscess, PTC 1/20: PTC capped by IR, LFTs normal 1/22: Right-sided abdominal pain, reconnected PTC, CT abdomen      Assessment/Plan:   Principal Problem:   Acute cholecystitis Active Problems:   AKI (acute kidney injury) (HCC)   Aortic stenosis, severe   Atrial fibrillation with RVR (HCC)   Protein-calorie malnutrition, severe   History of Roux-en-Y gastric bypass   OSA on CPAP   Nutrition Problem: Severe Malnutrition Etiology: chronic illness (gastric bypass)  Signs/Symptoms: severe fat depletion, severe muscle depletion   Body mass index is 25.49 kg/m.    Acute cholecystitis Gangrenous cholecystitis, complicated with sepsis status post subtotal cholecystectomy by Dr. Hillery Hunter on 1/8  -Postop ICU care due to tachycardia requiring esmolol and subsequent hypotension/shock  requiring vasopressors, A-fib with RVR. -MRCP: undrained fluid collection that the surgical drain traverses but unable to completely evacuate -IR following, s/p image guided drain into the subhepatic abscess and PTC on 1/17 (Dr. Loreta Ave) -Cultures + E. coli, resistant to Zosyn, changed to IV meropenem on 08/13/2023 -Per IR, would not place metal stent for the bile leak and recommended to continue drain.  PTC initially capped by IR on 1/20.  However on 1/21, patient had right-sided abdominal pain/shoulder pain, elevated leukocytosis, alk phos, PTC reconnected to drain.  -Continue JP drains for the subhepatic fluid collection -Eliquis was resumed on 1/20 anticipating no further procedures Follow-up with IR and general surgeon. Leukocytosis is improving.   Chronic hypotension -Initially placed on Solu-Cortef, stress dose steroids have been discontinued, patient has been chronically on Florinef for hypotension. -B12, folate normal, a.m. cortisol also normal 15.0 -Has received IV albumin He is asymptomatic. - continue midodrine 15 mg 3 times daily -Vitamin B1 level 230.8 (above normal range)   AKI (acute kidney injury) (HCC)- resolved -Creatinine stable   Hyponatremia- resolved -Asymptomatic   Aortic stenosis, severe Stable, no signs of acute decompensation   Atrial fibrillation with RVR (HCC) -Rate controlled -Continue midodrine for BP -Continue eliquis   History of Roux-en-Y gastric bypass Follow up as outpatient.    OSA on CPAP Continue Home Cpap.    Pressure injury documentation Right buttocks, stage I, Not present on admission -Nursing care   Right shoulder pain -Likely referred from right sided abdomen.  Shoulder x-ray showed osteoarthritis Analgesics as needed for pain  Severe protein calorie malnutrition Continue nutritional supplements -Vitamin B1  level 230.8 (above normal range)      Diet Order             Diet regular Room service appropriate? Yes; Fluid  consistency: Thin  Diet effective now                            Consultants: General surgeon Interventional radiologist Intensivist Cardiologist  Procedures: 08/01/2023 subtotal cholecystectomy (Dr Melody Haver, general surgery) 1/17: Image guided drain into subhepatic abscess, PTC    Medications:    acetaminophen  1,000 mg Oral Q6H   Or   acetaminophen  650 mg Rectal Q6H   acidophilus  1 capsule Oral Daily   alfuzosin  10 mg Oral Q breakfast   apixaban  5 mg Oral BID   Chlorhexidine Gluconate Cloth  6 each Topical Daily   docusate sodium  100 mg Oral BID   feeding supplement  237 mL Oral BID BM   fludrocortisone  0.1 mg Oral Daily   gabapentin  600 mg Oral QHS   latanoprost  1 drop Both Eyes QHS   midodrine  15 mg Oral TID WC   multivitamin with minerals  1 tablet Oral BID   nutrition supplement (JUVEN)  1 packet Oral BID BM   pantoprazole  40 mg Oral BID   polyethylene glycol  17 g Oral Daily   sodium chloride flush  3 mL Intravenous Q12H   sodium chloride flush  5 mL Intracatheter Q8H   venlafaxine XR  37.5 mg Oral Q breakfast   zolpidem  10 mg Oral QHS   Continuous Infusions:  meropenem (MERREM) IV 1 g (08/18/23 0624)     Anti-infectives (From admission, onward)    Start     Dose/Rate Route Frequency Ordered Stop   08/15/23 1615  cefTRIAXone (ROCEPHIN) 2 g in sodium chloride 0.9 % 100 mL IVPB        2 g 200 mL/hr over 30 Minutes Intravenous To Radiology 08/15/23 1525 08/15/23 1643   08/13/23 0800  meropenem (MERREM) 1 g in sodium chloride 0.9 % 100 mL IVPB        1 g 200 mL/hr over 30 Minutes Intravenous Every 8 hours 08/13/23 0714     08/09/23 1400  cefTRIAXone (ROCEPHIN) 2 g in sodium chloride 0.9 % 100 mL IVPB  Status:  Discontinued        2 g 200 mL/hr over 30 Minutes Intravenous To Radiology 08/09/23 1254 08/10/23 0716   08/09/23 1245  cefTRIAXone (ROCEPHIN) 2 g in sodium chloride 0.9 % 100 mL IVPB  Status:  Discontinued       Note to  Pharmacy: Please do not give on floor. To be given in IR.   2 g 200 mL/hr over 30 Minutes Intravenous To Radiology 08/09/23 1152 08/09/23 1213   08/07/23 1100  piperacillin-tazobactam (ZOSYN) IVPB 3.375 g  Status:  Discontinued        3.375 g 12.5 mL/hr over 240 Minutes Intravenous Every 8 hours 08/07/23 1008 08/13/23 0714   08/07/23 0000  piperacillin-tazobactam (ZOSYN) 3.375 GM/50ML IVPB        3.375 g Intravenous Every 8 hours 08/07/23 1501     07/29/23 0800  piperacillin-tazobactam (ZOSYN) IVPB 3.375 g        3.375 g 12.5 mL/hr over 240 Minutes Intravenous Every 8 hours 07/29/23 0207 08/06/23 2359   07/29/23 0030  piperacillin-tazobactam (ZOSYN) IVPB 3.375 g  3.375 g 100 mL/hr over 30 Minutes Intravenous  Once 07/29/23 0020 07/29/23 0147              Family Communication/Anticipated D/C date and plan/Code Status   DVT prophylaxis: SCDs Start: 07/29/23 0131 Place TED hose Start: 07/29/23 0131 apixaban (ELIQUIS) tablet 5 mg     Code Status: Full Code  Family Communication: None Disposition Plan: Plan to discharge to SNF   Status is: Inpatient Remains inpatient appropriate because: Gangrenous cholecystitis on IV antibiotics       Subjective:   Interval events noted.  He feels better.  Right shoulder pain is better.  No shortness of breath or chest pain.  Objective:    Vitals:   08/18/23 0503 08/18/23 0840 08/18/23 0900 08/18/23 1157  BP: 90/66 (!) 87/65 94/60 94/68   Pulse: 87 65 (!) 105 93  Resp: 18 18  18   Temp: 98.4 F (36.9 C) 98 F (36.7 C)  98.7 F (37.1 C)  TempSrc: Oral Oral    SpO2: 98% 99%  98%  Weight:      Height:       No data found.   Intake/Output Summary (Last 24 hours) at 08/18/2023 1210 Last data filed at 08/18/2023 1000 Gross per 24 hour  Intake 120 ml  Output 3165 ml  Net -3045 ml   Filed Weights   08/17/23 0455 08/17/23 0505 08/18/23 0324  Weight: 94.7 kg 94.7 kg 95 kg    Exam:  GEN: NAD SKIN: Warm and  dry EYES: No pallor or icterus ENT: MMM CV: RRR PULM: CTA B ABD: soft, ND, NT, +BS, + multiple drains and right upper quadrant CNS: AAO x 3, non focal EXT: Bilateral pedal edema.  No tenderness    Data Reviewed:   I have personally reviewed following labs and imaging studies:  Labs: Labs show the following:   Basic Metabolic Panel: Recent Labs  Lab 08/14/23 0540 08/15/23 0415 08/16/23 0700 08/17/23 0554 08/18/23 0500  NA 133* 134* 131* 136 135  K 3.7 4.2 4.0 4.2 3.9  CL 101 101 98 102 101  CO2 27 26 26 28 26   GLUCOSE 80 167* 98 98 99  BUN 19 21 23  25* 21  CREATININE 0.62 0.73 0.63 0.56* 0.78  CALCIUM 8.3* 8.6* 8.4* 8.6* 8.5*   GFR Estimated Creatinine Clearance: 104 mL/min (by C-G formula based on SCr of 0.78 mg/dL). Liver Function Tests: Recent Labs  Lab 08/14/23 0540 08/15/23 0415 08/16/23 0700 08/17/23 0554 08/18/23 0500  AST 16 26 22  13* 109*  ALT 16 26 32 23 78*  ALKPHOS 81 164* 222* 191* 472*  BILITOT 0.8 0.9 0.8 0.7 3.3*  PROT 5.2* 4.9* 5.0* 4.9* 4.7*  ALBUMIN 2.1* 2.0* 1.9* 1.9* 1.7*   No results for input(s): "LIPASE", "AMYLASE" in the last 168 hours. No results for input(s): "AMMONIA" in the last 168 hours. Coagulation profile No results for input(s): "INR", "PROTIME" in the last 168 hours.  CBC: Recent Labs  Lab 08/14/23 0540 08/15/23 0415 08/16/23 0700 08/17/23 0554 08/18/23 0500  WBC 12.9* 27.3* 19.2* 17.1* 13.2*  NEUTROABS  --   --  17.1*  --   --   HGB 9.6* 10.0* 9.3* 9.3* 8.6*  HCT 28.7* 30.3* 28.0* 28.2* 26.1*  MCV 92.9 93.5 93.6 93.4 93.2  PLT 315 302 266 263 234   Cardiac Enzymes: No results for input(s): "CKTOTAL", "CKMB", "CKMBINDEX", "TROPONINI" in the last 168 hours. BNP (last 3 results) No results for input(s): "PROBNP" in the  last 8760 hours. CBG: No results for input(s): "GLUCAP" in the last 168 hours. D-Dimer: No results for input(s): "DDIMER" in the last 72 hours. Hgb A1c: No results for input(s): "HGBA1C" in  the last 72 hours. Lipid Profile: No results for input(s): "CHOL", "HDL", "LDLCALC", "TRIG", "CHOLHDL", "LDLDIRECT" in the last 72 hours. Thyroid function studies: No results for input(s): "TSH", "T4TOTAL", "T3FREE", "THYROIDAB" in the last 72 hours.  Invalid input(s): "FREET3" Anemia work up: No results for input(s): "VITAMINB12", "FOLATE", "FERRITIN", "TIBC", "IRON", "RETICCTPCT" in the last 72 hours. Sepsis Labs: Recent Labs  Lab 08/15/23 0415 08/16/23 0700 08/17/23 0554 08/18/23 0500  WBC 27.3* 19.2* 17.1* 13.2*    Microbiology Recent Results (from the past 240 hours)  Aerobic/Anaerobic Culture w Gram Stain (surgical/deep wound)     Status: None   Collection Time: 08/10/23  2:14 PM   Specimen: Abdomen; Abscess  Result Value Ref Range Status   Specimen Description ABDOMEN  Final   Special Requests ABSCESS  Final   Gram Stain   Final    ABUNDANT WBC PRESENT, PREDOMINANTLY PMN MODERATE GRAM NEGATIVE RODS    Culture   Final    ABUNDANT ESCHERICHIA COLI ABUNDANT FUSOBACTERIUM MORTIFERUM ABUNDANT BACTEROIDES FRAGILIS BETA LACTAMASE POSITIVE Performed at [facility], [address], [place], [place] [address]    Report Status 08/13/2023 FINAL  Final   Organism ID, Bacteria ESCHERICHIA COLI  Final      Susceptibility   Escherichia coli - MIC*    AMPICILLIN >=32 RESISTANT Resistant     CEFEPIME <=0.12 SENSITIVE Sensitive     CEFTAZIDIME <=1 SENSITIVE Sensitive     CEFTRIAXONE <=0.25 SENSITIVE Sensitive     CIPROFLOXACIN <=0.25 SENSITIVE Sensitive     GENTAMICIN >=16 RESISTANT Resistant     IMIPENEM <=0.25 SENSITIVE Sensitive     TRIMETH/SULFA >=320 RESISTANT Resistant     AMPICILLIN/SULBACTAM >=32 RESISTANT Resistant     PIP/TAZO >=128 RESISTANT Resistant ug/mL    * ABUNDANT ESCHERICHIA COLI    Procedures and diagnostic studies:  DG Abd 1 View Result Date: 08/18/2023 CLINICAL DATA:  Bile leak scratched at postoperative bile leak. Acute cholecystitis. EXAM:  ABDOMEN - 1 VIEW COMPARISON:  IR procedure film 08/15/2023. CT of the abdomen and pelvis 08/15/2023. FINDINGS: Bowel gas pattern is normal.  Right hemidiaphragm remains elevated. The biliary drain is stable in position. A pigtail drainage catheter and large bore right-sided drain are stable. IMPRESSION: 1. Stable position of biliary drain. 2. Stable position of pigtail drainage catheter and large bore right-sided drain. 3. No acute abnormality. Electronically Signed   By: Marin Roberts M.D.   On: 08/18/2023 11:45               LOS: 20 days   Danilyn Cocke  [facility]   Pager on [email or web address]. If 7PM-7AM, please contact night-coverage at [email or web address]     08/18/2023, 12:10 PM

## 2023-08-18 NOTE — Progress Notes (Signed)
Informed Dr. Carl Best about the blood tinged drainage from bili drain and pinkish JP drain.

## 2023-08-19 DIAGNOSIS — K810 Acute cholecystitis: Secondary | ICD-10-CM | POA: Diagnosis not present

## 2023-08-19 LAB — CBC
HCT: 26.4 % — ABNORMAL LOW (ref 39.0–52.0)
Hemoglobin: 8.7 g/dL — ABNORMAL LOW (ref 13.0–17.0)
MCH: 30.5 pg (ref 26.0–34.0)
MCHC: 33 g/dL (ref 30.0–36.0)
MCV: 92.6 fL (ref 80.0–100.0)
Platelets: 264 10*3/uL (ref 150–400)
RBC: 2.85 MIL/uL — ABNORMAL LOW (ref 4.22–5.81)
RDW: 13.7 % (ref 11.5–15.5)
WBC: 11.7 10*3/uL — ABNORMAL HIGH (ref 4.0–10.5)
nRBC: 0 % (ref 0.0–0.2)

## 2023-08-19 LAB — COMPREHENSIVE METABOLIC PANEL
ALT: 55 U/L — ABNORMAL HIGH (ref 0–44)
AST: 39 U/L (ref 15–41)
Albumin: 1.8 g/dL — ABNORMAL LOW (ref 3.5–5.0)
Alkaline Phosphatase: 360 U/L — ABNORMAL HIGH (ref 38–126)
Anion gap: 6 (ref 5–15)
BUN: 19 mg/dL (ref 8–23)
CO2: 26 mmol/L (ref 22–32)
Calcium: 8.6 mg/dL — ABNORMAL LOW (ref 8.9–10.3)
Chloride: 102 mmol/L (ref 98–111)
Creatinine, Ser: 0.52 mg/dL — ABNORMAL LOW (ref 0.61–1.24)
GFR, Estimated: >60 mL/min (ref >60)
Glucose, Bld: 96 mg/dL (ref 70–99)
Potassium: 4.2 mmol/L (ref 3.5–5.1)
Sodium: 134 mmol/L — ABNORMAL LOW (ref 135–145)
Total Bilirubin: 1.4 mg/dL — ABNORMAL HIGH (ref 0.0–1.2)
Total Protein: 5.6 g/dL — ABNORMAL LOW (ref 6.5–8.1)

## 2023-08-19 NOTE — Plan of Care (Signed)

## 2023-08-19 NOTE — Plan of Care (Signed)
  Problem: Education: Goal: Knowledge of General Education information will improve Description: Including pain rating scale, medication(s)/side effects and non-pharmacologic comfort measures Outcome: Progressing   Problem: Health Behavior/Discharge Planning: Goal: Ability to manage health-related needs will improve Outcome: Progressing   Problem: Clinical Measurements: Goal: Ability to maintain clinical measurements within normal limits will improve Outcome: Progressing Goal: Will remain free from infection Outcome: Progressing Goal: Diagnostic test results will improve Outcome: Progressing Goal: Cardiovascular complication will be avoided Outcome: Progressing   Problem: Activity: Goal: Risk for activity intolerance will decrease Outcome: Progressing   Problem: Coping: Goal: Level of anxiety will decrease Outcome: Progressing   Problem: Pain Management: Goal: General experience of comfort will improve Outcome: Progressing   Problem: Safety: Goal: Ability to remain free from injury will improve Outcome: Progressing   Problem: Skin Integrity: Goal: Risk for impaired skin integrity will decrease Outcome: Progressing

## 2023-08-19 NOTE — Progress Notes (Signed)
Progress Note    Francisco Padilla  UJW:[id number] DOB: 06-Dec-1950  DOA: 07/28/2023 PCP: [name], MD      Brief Narrative:    Medical records reviewed and are as summarized below:  Francisco Padilla is a 73 y.o. male  with pertinent PMH OSA on CPAP, HLD, chronic hypotension, PAF on Eliquis, h/o bariatric surgery, PVD presents to [facility] ED on 1/5 with cholecystitis. On arrival patient noted to be slightly tachycardic 100s and mildly hypotensive 93/73.  CT ABD/pelvis showing acute cholecystitis.  Surgery consulted.  Given IV fluids and Zosyn.  Awaiting surgery 48 hours for Eliquis washout. Cards consulted for preoperative evaluation of cholecystectomy. Echo on 1/7 showing lvef 60-65%; severe aortic stenosis.  On 1/8 patient taken to the OR for cholecystectomy showing gangrenous cholecystitis.   While in the OR patient had tachycardia 150s and started on esmolol.  Patient later became hypotensive and started on pressors and put in the ICU. Started on IV Abx and had percutaneous abscess drainage by IR 1/17. 1/10 episode of afib RVR and hypotension.    1/17: Image guided drain into subhepatic abscess, PTC 1/20: PTC capped by IR, LFTs normal 1/22: Right-sided abdominal pain, reconnected PTC, CT abdomen      Assessment/Plan:   Principal Problem:   Acute cholecystitis Active Problems:   AKI (acute kidney injury) (HCC)   Aortic stenosis, severe   Atrial fibrillation with RVR (HCC)   Protein-calorie malnutrition, severe   History of Roux-en-Y gastric bypass   OSA on CPAP   Nutrition Problem: Severe Malnutrition Etiology: chronic illness (gastric bypass)  Signs/Symptoms: severe fat depletion, severe muscle depletion   Body mass index is 24.8 kg/m.    Acute cholecystitis Gangrenous cholecystitis, complicated with sepsis status post subtotal cholecystectomy by Dr. Hillery Hunter on 1/8  -Postop ICU care due to tachycardia requiring esmolol and subsequent hypotension/shock  requiring vasopressors, A-fib with RVR. -MRCP: undrained fluid collection that the surgical drain traverses but unable to completely evacuate -IR following, s/p image guided drain into the subhepatic abscess and PTC on 1/17 (Dr. Loreta Ave) -Cultures + E. coli, resistant to Zosyn, changed to IV meropenem on 08/13/2023 -Per IR, would not place metal stent for the bile leak and recommended to continue drain.  PTC initially capped by IR on 1/20.  However on 1/21, patient had right-sided abdominal pain/shoulder pain, elevated leukocytosis, alk phos, PTC reconnected to drain.  -Continue JP drains for the subhepatic fluid collection Abdominal x-ray on 08/18/2023 showed stable position of biliary drain, pigtail drainage catheter and large bore right-sided drain -Eliquis was resumed on 1/20 anticipating no further procedures Follow-up with IR and general surgeon. Leukocytosis is improving.   Chronic hypotension -Initially placed on Solu-Cortef, stress dose steroids have been discontinued, patient has been chronically on Florinef for hypotension. -B12, folate normal, a.m. cortisol also normal 15.0 -Has received IV albumin He is asymptomatic. - continue midodrine 15 mg 3 times daily -Vitamin B1 level 230.8 (above normal range)   AKI (acute kidney injury) (HCC)- resolved -Creatinine stable   Hyponatremia- resolved -Asymptomatic   Aortic stenosis, severe Stable, no signs of acute decompensation   Atrial fibrillation with RVR (HCC) -Rate controlled -Continue midodrine for BP -Continue eliquis   History of Roux-en-Y gastric bypass Follow up as outpatient.    OSA on CPAP Continue Home Cpap.    Pressure injury documentation Right buttocks, stage I, Not present on admission -Nursing care   Right shoulder pain -Likely referred from right sided abdomen.  Shoulder  x-ray showed osteoarthritis Analgesics as needed for pain  Severe protein calorie malnutrition Continue nutritional  supplements -Vitamin B1 level 230.8 (above normal range)      Diet Order             Diet regular Room service appropriate? Yes; Fluid consistency: Thin  Diet effective now                            Consultants: General surgeon Interventional radiologist Intensivist Cardiologist  Procedures: 08/01/2023 subtotal cholecystectomy (Dr Melody Haver, general surgery) 1/17: Image guided drain into subhepatic abscess, PTC    Medications:    acetaminophen  1,000 mg Oral Q6H   Or   acetaminophen  650 mg Rectal Q6H   acidophilus  1 capsule Oral Daily   alfuzosin  10 mg Oral Q breakfast   apixaban  5 mg Oral BID   Chlorhexidine Gluconate Cloth  6 each Topical Daily   docusate sodium  100 mg Oral BID   feeding supplement  237 mL Oral BID BM   fludrocortisone  0.1 mg Oral Daily   gabapentin  600 mg Oral QHS   latanoprost  1 drop Both Eyes QHS   midodrine  15 mg Oral TID WC   multivitamin with minerals  1 tablet Oral BID   nutrition supplement (JUVEN)  1 packet Oral BID BM   pantoprazole  40 mg Oral BID   polyethylene glycol  17 g Oral Daily   sodium chloride flush  3 mL Intravenous Q12H   sodium chloride flush  5 mL Intracatheter Q8H   venlafaxine XR  37.5 mg Oral Q breakfast   zolpidem  10 mg Oral QHS   Continuous Infusions:  meropenem (MERREM) IV 1 g (08/19/23 1413)     Anti-infectives (From admission, onward)    Start     Dose/Rate Route Frequency Ordered Stop   08/15/23 1615  cefTRIAXone (ROCEPHIN) 2 g in sodium chloride 0.9 % 100 mL IVPB        2 g 200 mL/hr over 30 Minutes Intravenous To Radiology 08/15/23 1525 08/15/23 1643   08/13/23 0800  meropenem (MERREM) 1 g in sodium chloride 0.9 % 100 mL IVPB        1 g 200 mL/hr over 30 Minutes Intravenous Every 8 hours 08/13/23 0714     08/09/23 1400  cefTRIAXone (ROCEPHIN) 2 g in sodium chloride 0.9 % 100 mL IVPB  Status:  Discontinued        2 g 200 mL/hr over 30 Minutes Intravenous To Radiology  08/09/23 1254 08/10/23 0716   08/09/23 1245  cefTRIAXone (ROCEPHIN) 2 g in sodium chloride 0.9 % 100 mL IVPB  Status:  Discontinued       Note to Pharmacy: Please do not give on floor. To be given in IR.   2 g 200 mL/hr over 30 Minutes Intravenous To Radiology 08/09/23 1152 08/09/23 1213   08/07/23 1100  piperacillin-tazobactam (ZOSYN) IVPB 3.375 g  Status:  Discontinued        3.375 g 12.5 mL/hr over 240 Minutes Intravenous Every 8 hours 08/07/23 1008 08/13/23 0714   08/07/23 0000  piperacillin-tazobactam (ZOSYN) 3.375 GM/50ML IVPB        3.375 g Intravenous Every 8 hours 08/07/23 1501     07/29/23 0800  piperacillin-tazobactam (ZOSYN) IVPB 3.375 g        3.375 g 12.5 mL/hr over 240 Minutes Intravenous Every 8 hours 07/29/23 0207 08/06/23  2359   07/29/23 0030  piperacillin-tazobactam (ZOSYN) IVPB 3.375 g        3.375 g 100 mL/hr over 30 Minutes Intravenous  Once 07/29/23 0020 07/29/23 0147              Family Communication/Anticipated D/C date and plan/Code Status   DVT prophylaxis: SCDs Start: 07/29/23 0131 Place TED hose Start: 07/29/23 0131 apixaban (ELIQUIS) tablet 5 mg     Code Status: Full Code  Family Communication: None Disposition Plan: Plan to discharge to SNF   Status is: Inpatient Remains inpatient appropriate because: Gangrenous cholecystitis on IV antibiotics       Subjective:   No acute events overnight.  No new complaints.  He still has some pain on the right side of the abdomen  Objective:    Vitals:   08/19/23 0815 08/19/23 1038 08/19/23 1113 08/19/23 1313  BP: 96/77 (!) 86/65 (!) 87/62 92/62  Pulse: 98 (!) 101 96 68  Resp: 20  18   Temp: 98.3 F (36.8 C)  98.5 F (36.9 C)   TempSrc: Oral  Oral   SpO2: 96%  98%   Weight:      Height:       No data found.   Intake/Output Summary (Last 24 hours) at 08/19/2023 1429 Last data filed at 08/19/2023 1200 Gross per 24 hour  Intake 360 ml  Output 3021 ml  Net -2661 ml   Filed Weights    08/17/23 0505 08/18/23 0324 08/19/23 0415  Weight: 94.7 kg 95 kg 92.4 kg    Exam:  GEN: NAD SKIN: Warm and dry EYES: No pallor or icterus ENT: MMM CV: RRR PULM: CTA B ABD: soft, ND, NT, +BS, + drain with dark greenish fluid, + drains with brownish fluid CNS: AAO x 3, non focal EXT: No edema or tenderness     Data Reviewed:   I have personally reviewed following labs and imaging studies:  Labs: Labs show the following:   Basic Metabolic Panel: Recent Labs  Lab 08/15/23 0415 08/16/23 0700 08/17/23 0554 08/18/23 0500 08/19/23 0423  NA 134* 131* 136 135 134*  K 4.2 4.0 4.2 3.9 4.2  CL 101 98 102 101 102  CO2 26 26 28 26 26   GLUCOSE 167* 98 98 99 96  BUN 21 23 25* 21 19  CREATININE 0.73 0.63 0.56* 0.78 0.52*  CALCIUM 8.6* 8.4* 8.6* 8.5* 8.6*   GFR Estimated Creatinine Clearance: 104 mL/min (A) (by C-G formula based on SCr of 0.52 mg/dL (L)). Liver Function Tests: Recent Labs  Lab 08/15/23 0415 08/16/23 0700 08/17/23 0554 08/18/23 0500 08/19/23 0423  AST 26 22 13* 109* 39  ALT 26 32 23 78* 55*  ALKPHOS 164* 222* 191* 472* 360*  BILITOT 0.9 0.8 0.7 3.3* 1.4*  PROT 4.9* 5.0* 4.9* 4.7* 5.6*  ALBUMIN 2.0* 1.9* 1.9* 1.7* 1.8*   No results for input(s): "LIPASE", "AMYLASE" in the last 168 hours. No results for input(s): "AMMONIA" in the last 168 hours. Coagulation profile No results for input(s): "INR", "PROTIME" in the last 168 hours.  CBC: Recent Labs  Lab 08/15/23 0415 08/16/23 0700 08/17/23 0554 08/18/23 0500 08/19/23 0423  WBC 27.3* 19.2* 17.1* 13.2* 11.7*  NEUTROABS  --  17.1*  --   --   --   HGB 10.0* 9.3* 9.3* 8.6* 8.7*  HCT 30.3* 28.0* 28.2* 26.1* 26.4*  MCV 93.5 93.6 93.4 93.2 92.6  PLT 302 266 263 234 264   Cardiac Enzymes: No results for  input(s): "CKTOTAL", "CKMB", "CKMBINDEX", "TROPONINI" in the last 168 hours. BNP (last 3 results) No results for input(s): "PROBNP" in the last 8760 hours. CBG: No results for input(s): "GLUCAP"  in the last 168 hours. D-Dimer: No results for input(s): "DDIMER" in the last 72 hours. Hgb A1c: No results for input(s): "HGBA1C" in the last 72 hours. Lipid Profile: No results for input(s): "CHOL", "HDL", "LDLCALC", "TRIG", "CHOLHDL", "LDLDIRECT" in the last 72 hours. Thyroid function studies: No results for input(s): "TSH", "T4TOTAL", "T3FREE", "THYROIDAB" in the last 72 hours.  Invalid input(s): "FREET3" Anemia work up: No results for input(s): "VITAMINB12", "FOLATE", "FERRITIN", "TIBC", "IRON", "RETICCTPCT" in the last 72 hours. Sepsis Labs: Recent Labs  Lab 08/16/23 0700 08/17/23 0554 08/18/23 0500 08/19/23 0423  WBC 19.2* 17.1* 13.2* 11.7*    Microbiology Recent Results (from the past 240 hours)  Aerobic/Anaerobic Culture w Gram Stain (surgical/deep wound)     Status: None   Collection Time: 08/10/23  2:14 PM   Specimen: Abdomen; Abscess  Result Value Ref Range Status   Specimen Description ABDOMEN  Final   Special Requests ABSCESS  Final   Gram Stain   Final    ABUNDANT WBC PRESENT, PREDOMINANTLY PMN MODERATE GRAM NEGATIVE RODS    Culture   Final    ABUNDANT ESCHERICHIA COLI ABUNDANT FUSOBACTERIUM MORTIFERUM ABUNDANT BACTEROIDES FRAGILIS BETA LACTAMASE POSITIVE Performed at [facility], [address], [place], [place] [address]    Report Status 08/13/2023 FINAL  Final   Organism ID, Bacteria ESCHERICHIA COLI  Final      Susceptibility   Escherichia coli - MIC*    AMPICILLIN >=32 RESISTANT Resistant     CEFEPIME <=0.12 SENSITIVE Sensitive     CEFTAZIDIME <=1 SENSITIVE Sensitive     CEFTRIAXONE <=0.25 SENSITIVE Sensitive     CIPROFLOXACIN <=0.25 SENSITIVE Sensitive     GENTAMICIN >=16 RESISTANT Resistant     IMIPENEM <=0.25 SENSITIVE Sensitive     TRIMETH/SULFA >=320 RESISTANT Resistant     AMPICILLIN/SULBACTAM >=32 RESISTANT Resistant     PIP/TAZO >=128 RESISTANT Resistant ug/mL    * ABUNDANT ESCHERICHIA COLI    Procedures and diagnostic  studies:  DG Abd 1 View Result Date: 08/18/2023 CLINICAL DATA:  Bile leak scratched at postoperative bile leak. Acute cholecystitis. EXAM: ABDOMEN - 1 VIEW COMPARISON:  IR procedure film 08/15/2023. CT of the abdomen and pelvis 08/15/2023. FINDINGS: Bowel gas pattern is normal.  Right hemidiaphragm remains elevated. The biliary drain is stable in position. A pigtail drainage catheter and large bore right-sided drain are stable. IMPRESSION: 1. Stable position of biliary drain. 2. Stable position of pigtail drainage catheter and large bore right-sided drain. 3. No acute abnormality. Electronically Signed   By: Marin Roberts M.D.   On: 08/18/2023 11:45               LOS: 21 days   Eulalie Speights  [facility]   Pager on [email or web address]. If 7PM-7AM, please contact night-coverage at [email or web address]     08/19/2023, 2:29 PM

## 2023-08-19 NOTE — Plan of Care (Signed)
Problem: Clinical Measurements: Goal: Respiratory complications will improve Outcome: Completed/Met   Problem: Nutrition: Goal: Adequate nutrition will be maintained Outcome: Completed/Met   Problem: Elimination: Goal: Will not experience complications related to bowel motility Outcome: Completed/Met Goal: Will not experience complications related to urinary retention Outcome: Completed/Met

## 2023-08-19 NOTE — Progress Notes (Signed)
18 Days Post-Op   Subjective/Chief Complaint: Patient feeling better today.   Objective: Vital signs in last 24 hours: Temp:  [97.3 F (36.3 C)-98.7 F (37.1 C)] 98.5 F (36.9 C) (01/26 1113) Pulse Rate:  [80-106] 96 (01/26 1113) Resp:  [18-20] 18 (01/26 1113) BP: (84-110)/(58-78) 87/62 (01/26 1113) SpO2:  [96 %-100 %] 98 % (01/26 1113) Weight:  [92.4 kg] 92.4 kg (01/26 0415) Last BM Date : 08/18/23  Intake/Output from previous day: 01/25 0701 - 01/26 0700 In: 320 [P.O.:220; IV Piggyback:100] Out: 2021 [Urine:1000; Drains:1020; Stool:1] Intake/Output this shift: Total I/O In: 360 [P.O.:360] Out: 1030 [Urine:700; Drains:330]  Gen: NAD Heart: irregular Abd: soft, incisions c/d/I, PTC with bile in drain bag, both perc drains with purulent output    Lab Results:  Recent Labs    08/18/23 0500 08/19/23 0423  WBC 13.2* 11.7*  HGB 8.6* 8.7*  HCT 26.1* 26.4*  PLT 234 264   BMET Recent Labs    08/18/23 0500 08/19/23 0423  NA 135 134*  K 3.9 4.2  CL 101 102  CO2 26 26  GLUCOSE 99 96  BUN 21 19  CREATININE 0.78 0.52*  CALCIUM 8.5* 8.6*      Latest Ref Rng & Units 08/19/2023    4:23 AM 08/18/2023    5:00 AM 08/17/2023    5:54 AM  Hepatic Function  Total Protein 6.5 - 8.1 g/dL 5.6  4.7  4.9   Albumin 3.5 - 5.0 g/dL 1.8  1.7  1.9   AST 15 - 41 U/L 39  109  13   ALT 0 - 44 U/L 55  78  23   Alk Phosphatase 38 - 126 U/L 360  472  191   Total Bilirubin 0.0 - 1.2 mg/dL 1.4  3.3  0.7      Anti-infectives: Anti-infectives (From admission, onward)    Start     Dose/Rate Route Frequency Ordered Stop   08/15/23 1615  cefTRIAXone (ROCEPHIN) 2 g in sodium chloride 0.9 % 100 mL IVPB        2 g 200 mL/hr over 30 Minutes Intravenous To Radiology 08/15/23 1525 08/15/23 1643   08/13/23 0800  meropenem (MERREM) 1 g in sodium chloride 0.9 % 100 mL IVPB        1 g 200 mL/hr over 30 Minutes Intravenous Every 8 hours 08/13/23 0714     08/09/23 1400  cefTRIAXone (ROCEPHIN) 2 g  in sodium chloride 0.9 % 100 mL IVPB  Status:  Discontinued        2 g 200 mL/hr over 30 Minutes Intravenous To Radiology 08/09/23 1254 08/10/23 0716   08/09/23 1245  cefTRIAXone (ROCEPHIN) 2 g in sodium chloride 0.9 % 100 mL IVPB  Status:  Discontinued       Note to Pharmacy: Please do not give on floor. To be given in IR.   2 g 200 mL/hr over 30 Minutes Intravenous To Radiology 08/09/23 1152 08/09/23 1213   08/07/23 1100  piperacillin-tazobactam (ZOSYN) IVPB 3.375 g  Status:  Discontinued        3.375 g 12.5 mL/hr over 240 Minutes Intravenous Every 8 hours 08/07/23 1008 08/13/23 0714   08/07/23 0000  piperacillin-tazobactam (ZOSYN) 3.375 GM/50ML IVPB        3.375 g Intravenous Every 8 hours 08/07/23 1501     07/29/23 0800  piperacillin-tazobactam (ZOSYN) IVPB 3.375 g        3.375 g 12.5 mL/hr over 240 Minutes Intravenous Every 8 hours 07/29/23  0207 08/06/23 2359   07/29/23 0030  piperacillin-tazobactam (ZOSYN) IVPB 3.375 g        3.375 g 100 mL/hr over 30 Minutes Intravenous  Once 07/29/23 0020 07/29/23 0147       Assessment/Plan: s/p subtotal cholecystectomy for gangrenous cholecystitis, Dr. Hillery Hunter 08/01/23 -regular diet -cont abx therapy, but switch to Merrem as E coli in cultures is resistant to zosyn.  WBC trending down Will continue to leave PTC to gravity for today -cont JP drains as they are continuing to drain this subhepatic fluid collection -Eliquis restarted -will follow up with Dr. Hillery Hunter and IR as outpatient.     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  They also have no beds and unclear at what point that may change to even accept the patient.  Their recommendation was to have IR place a PTC drain so this stents his CBD and essentially accomplishes the same goal in a safe technique.     FEN -regular VTE - Eliquis ID - Zosyn, changed to merrem on 1/20   A  fib AKI Peripheral neuropathy GERD HLD Hx of gastric bypass  LOS: 21 days    Vanita Panda 08/19/2023

## 2023-08-20 DIAGNOSIS — K810 Acute cholecystitis: Secondary | ICD-10-CM | POA: Diagnosis not present

## 2023-08-20 DIAGNOSIS — I4891 Unspecified atrial fibrillation: Secondary | ICD-10-CM | POA: Diagnosis not present

## 2023-08-20 DIAGNOSIS — N179 Acute kidney failure, unspecified: Secondary | ICD-10-CM | POA: Diagnosis not present

## 2023-08-20 DIAGNOSIS — R6510 Systemic inflammatory response syndrome (SIRS) of non-infectious origin without acute organ dysfunction: Secondary | ICD-10-CM | POA: Diagnosis not present

## 2023-08-20 LAB — COMPREHENSIVE METABOLIC PANEL
ALT: 35 U/L (ref 0–44)
AST: 18 U/L (ref 15–41)
Albumin: 1.8 g/dL — ABNORMAL LOW (ref 3.5–5.0)
Alkaline Phosphatase: 264 U/L — ABNORMAL HIGH (ref 38–126)
Anion gap: 8 (ref 5–15)
BUN: 19 mg/dL (ref 8–23)
CO2: 26 mmol/L (ref 22–32)
Calcium: 8.5 mg/dL — ABNORMAL LOW (ref 8.9–10.3)
Chloride: 100 mmol/L (ref 98–111)
Creatinine, Ser: 0.66 mg/dL (ref 0.61–1.24)
GFR, Estimated: >60 mL/min (ref >60)
Glucose, Bld: 113 mg/dL — ABNORMAL HIGH (ref 70–99)
Potassium: 4.1 mmol/L (ref 3.5–5.1)
Sodium: 134 mmol/L — ABNORMAL LOW (ref 135–145)
Total Bilirubin: 1.1 mg/dL (ref 0.0–1.2)
Total Protein: 5.8 g/dL — ABNORMAL LOW (ref 6.5–8.1)

## 2023-08-20 LAB — CBC
HCT: 27.7 % — ABNORMAL LOW (ref 39.0–52.0)
Hemoglobin: 9.1 g/dL — ABNORMAL LOW (ref 13.0–17.0)
MCH: 30.2 pg (ref 26.0–34.0)
MCHC: 32.9 g/dL (ref 30.0–36.0)
MCV: 92 fL (ref 80.0–100.0)
Platelets: 311 10*3/uL (ref 150–400)
RBC: 3.01 MIL/uL — ABNORMAL LOW (ref 4.22–5.81)
RDW: 13.8 % (ref 11.5–15.5)
WBC: 13.7 10*3/uL — ABNORMAL HIGH (ref 4.0–10.5)
nRBC: 0 % (ref 0.0–0.2)

## 2023-08-20 NOTE — Progress Notes (Addendum)
PROGRESS NOTE  ROARK RUFO ZOX:[id number] DOB: [birth year]   PCP: [name], MD  Patient is from: Home.  DOA: 07/28/2023 LOS: 22  Chief complaints Chief Complaint  Patient presents with   Weakness     Brief Narrative / Interim history: 73 year old M with PMH of OSA on CPAP, hypertension, PAF on Eliquis, PVD and bariatric surgery presented to ED on 1/5 with generalized weakness and abdominal pain and admitted with acute cholecystitis as noted on CT abdomen and pelvis.  Started on IV Zosyn.  General surgery consulted.  He underwent laparoscopic cholecystectomy on 1/8 after Eliquis washout cardiac clearance by cardiology.  He is TTE showed LVEF of 60 to 65% with severe AS.  While in OR, patient became tachycardic and started on esmolol.  Later, he became hypotensive and started on pressor and transferred to ICU.  Patient continued to have right-sided abdominal pain.  Imaging concerning for biliary leak/possible subhepatic abscess.  He had percutaneous biliary drain placed on 1/17.  Fluid culture with beta-lactamase positive E. coli.  Antibiotics changed to IV meropenem on 1/20.  PTC was capped on 1/20 but he developed right-sided abdominal pain again and PTC was reconnected on 1/22.  Therapy recommended SNF.  Subjective: Seen and examined earlier this morning.  No major events overnight of this morning.  He stated that he had a right-sided chest/abdominal pain radiating to his left.  Pain has improved after pain medication.  However, he says he has constant right-sided pain that she rates as 7/10.  He denies nausea or vomiting.  Objective: Vitals:   08/19/23 2331 08/20/23 0453 08/20/23 0742 08/20/23 1027  BP: 93/62 96/70 99/62  95/65  Pulse: 95 96 85 89  Resp: 18 18 17 20   Temp: 97.6 F (36.4 C) 97.6 F (36.4 C) 97.6 F (36.4 C) 98.4 F (36.9 C)  TempSrc: Oral Oral Oral Oral  SpO2: 99% 97% 100% 99%  Weight:  91.1 kg    Height:        Examination:  GENERAL: No apparent  distress.  Nontoxic. HEENT: MMM.  Vision and hearing grossly intact.  NECK: Supple.  No apparent JVD.  RESP:  No IWOB.  Fair aeration bilaterally. CVS:  RRR. Heart sounds normal.  ABD/GI/GU: BS+. Abd soft, NTND.  2 JP drains to right abdomen.  No significant output.  Biliary drain.  MSK/EXT:  Moves extremities. No apparent deformity. No edema.  SKIN: no apparent skin lesion or wound NEURO: Awake, alert and oriented appropriately.  No apparent focal neuro deficit. PSYCH: Calm. Normal affect.   Procedures:  1/17: Image guided drain into subhepatic abscess, PTC 1/20: PTC capped by IR, LFTs normal 1/22: Right-sided abdominal pain, reconnected PTC, CT abdomen  Microbiology summarized: 1/5-COVID-19, influenza and RSV PCR nonreactive 1/7-MRSA PCR screen negative 1/5-blood cultures NGTD 1/17-abscess/biliary culture with beta-lactamase positive for E. coli.  Assessment and plan: Sepsis due to acute gangrenous cholecystitis: -S/p subtotal cholecystectomy by Dr. Hillery Hunter on 1/8  -MRCP on 1/15: undrained fluid collection that the surgical drain traverses but unable to completely evacuate -S/p image guided drain into the subhepatic abscess and PTC on 1/17 (Dr. Loreta Ave) -Abscess cultures on 1/17 with beta-lactamase positive for E. coli.  IV Zosyn changed to meropenem on 1/20. -PTC initially capped by IR on 1/20. However, on 1/21, patient had right-sided abdominal pain/shoulder pain, elevated leukocytosis, alk phos, PTC reconnected to drain.  -Continue JP drains for the subhepatic fluid collection -Eliquis was resumed on 1/20 anticipating no further procedures -Follow-up with IR and  general Careers adviser.  Addendum Per surgery,  [facility] reviewed his case and their [facility] GI noted their concern with ERCP with GG access and possible complications from this procedure for GG fistula etc.  They also have no beds and unclear at what point that may change to even accept the patient.  Their recommendation was to have  IR place a PTC drain so this stents his CBD and essentially accomplishes the same goal in a safe technique.    Chronic hypotension: On Solu-Cortef and midodrine at home.  TTE with severe aortic stenosis.  TSH, B12, folate and a.m. cortisol normal.  Received IV albumin.  Now normotensive. -Continue midodrine 15 mg 3 times daily -Continue Solu-Cortef  Severe aortic stenosis: Noted on TTE for preoperative evaluation. -Outpatient follow-up with cardiology.   AKI (acute kidney injury) (HCC)- resolved -Creatinine stable   Hyponatremia-mild.  Asymptomatic -Monitor   Persistent atrial fibrillation with RVR: Rate controlled. -Continue eliquis   History of Roux-en-Y gastric bypass -Follow up as outpatient.    OSA on CPAP -Continue Home Cpap.   Severe malnutrition: Body mass index is 24.45 kg/m. Nutrition Problem: Severe Malnutrition Etiology: chronic illness (gastric bypass) Signs/Symptoms: severe fat depletion, severe muscle depletion Interventions: Ensure Enlive (each supplement provides 350kcal and 20 grams of protein), MVI, Carnation Instant Breakfast  Pressure skin injury:  Pressure Injury 08/07/23 Buttocks Right Stage 2 -  Partial thickness loss of dermis presenting as a shallow open injury with a red, pink wound bed without slough. round and red (Active)  08/07/23 2000  Location: Buttocks  Location Orientation: Right  Staging: Stage 2 -  Partial thickness loss of dermis presenting as a shallow open injury with a red, pink wound bed without slough.  Wound Description (Comments): round and red  Present on Admission: No  Dressing Type Foam - Lift dressing to assess site every shift 08/19/23 2200   DVT prophylaxis:  SCDs Start: 07/29/23 0131 Place TED hose Start: 07/29/23 0131 apixaban (ELIQUIS) tablet 5 mg  Code Status: Full code Family Communication: None at the bedside Level of care: Med-Surg Status is: Inpatient Remains inpatient appropriate because: Acute gangrenous  cholecystitis with subhepatic abscess requiring antibiotics   Final disposition: SNF Consultants:  General surgery Interventional cardiology Cardiology  55 minutes with more than 50% spent in reviewing records, counseling patient/family and coordinating care.   Sch Meds:  Scheduled Meds:  acetaminophen  1,000 mg Oral Q6H   Or   acetaminophen  650 mg Rectal Q6H   acidophilus  1 capsule Oral Daily   alfuzosin  10 mg Oral Q breakfast   apixaban  5 mg Oral BID   Chlorhexidine Gluconate Cloth  6 each Topical Daily   docusate sodium  100 mg Oral BID   feeding supplement  237 mL Oral BID BM   fludrocortisone  0.1 mg Oral Daily   gabapentin  600 mg Oral QHS   latanoprost  1 drop Both Eyes QHS   midodrine  15 mg Oral TID WC   multivitamin with minerals  1 tablet Oral BID   nutrition supplement (JUVEN)  1 packet Oral BID BM   pantoprazole  40 mg Oral BID   polyethylene glycol  17 g Oral Daily   sodium chloride flush  3 mL Intravenous Q12H   sodium chloride flush  5 mL Intracatheter Q8H   venlafaxine XR  37.5 mg Oral Q breakfast   zolpidem  10 mg Oral QHS   Continuous Infusions:  meropenem (MERREM) IV 1 g (08/20/23  3086)   PRN Meds:.Gerhardt's butt cream, HYDROmorphone (DILAUDID) injection, iohexol, liver oil-zinc oxide, LORazepam, mouth rinse, oxyCODONE, simethicone  Antimicrobials: Anti-infectives (From admission, onward)    Start     Dose/Rate Route Frequency Ordered Stop   08/15/23 1615  cefTRIAXone (ROCEPHIN) 2 g in sodium chloride 0.9 % 100 mL IVPB        2 g 200 mL/hr over 30 Minutes Intravenous To Radiology 08/15/23 1525 08/15/23 1643   08/13/23 0800  meropenem (MERREM) 1 g in sodium chloride 0.9 % 100 mL IVPB        1 g 200 mL/hr over 30 Minutes Intravenous Every 8 hours 08/13/23 0714     08/09/23 1400  cefTRIAXone (ROCEPHIN) 2 g in sodium chloride 0.9 % 100 mL IVPB  Status:  Discontinued        2 g 200 mL/hr over 30 Minutes Intravenous To Radiology 08/09/23 1254  08/10/23 0716   08/09/23 1245  cefTRIAXone (ROCEPHIN) 2 g in sodium chloride 0.9 % 100 mL IVPB  Status:  Discontinued       Note to Pharmacy: Please do not give on floor. To be given in IR.   2 g 200 mL/hr over 30 Minutes Intravenous To Radiology 08/09/23 1152 08/09/23 1213   08/07/23 1100  piperacillin-tazobactam (ZOSYN) IVPB 3.375 g  Status:  Discontinued        3.375 g 12.5 mL/hr over 240 Minutes Intravenous Every 8 hours 08/07/23 1008 08/13/23 0714   08/07/23 0000  piperacillin-tazobactam (ZOSYN) 3.375 GM/50ML IVPB        3.375 g Intravenous Every 8 hours 08/07/23 1501     07/29/23 0800  piperacillin-tazobactam (ZOSYN) IVPB 3.375 g        3.375 g 12.5 mL/hr over 240 Minutes Intravenous Every 8 hours 07/29/23 0207 08/06/23 2359   07/29/23 0030  piperacillin-tazobactam (ZOSYN) IVPB 3.375 g        3.375 g 100 mL/hr over 30 Minutes Intravenous  Once 07/29/23 0020 07/29/23 0147        I have personally reviewed the following labs and images: CBC: Recent Labs  Lab 08/15/23 0415 08/16/23 0700 08/17/23 0554 08/18/23 0500 08/19/23 0423  WBC 27.3* 19.2* 17.1* 13.2* 11.7*  NEUTROABS  --  17.1*  --   --   --   HGB 10.0* 9.3* 9.3* 8.6* 8.7*  HCT 30.3* 28.0* 28.2* 26.1* 26.4*  MCV 93.5 93.6 93.4 93.2 92.6  PLT 302 266 263 234 264   BMP &GFR Recent Labs  Lab 08/15/23 0415 08/16/23 0700 08/17/23 0554 08/18/23 0500 08/19/23 0423  NA 134* 131* 136 135 134*  K 4.2 4.0 4.2 3.9 4.2  CL 101 98 102 101 102  CO2 26 26 28 26 26   GLUCOSE 167* 98 98 99 96  BUN 21 23 25* 21 19  CREATININE 0.73 0.63 0.56* 0.78 0.52*  CALCIUM 8.6* 8.4* 8.6* 8.5* 8.6*   Estimated Creatinine Clearance: 104 mL/min (A) (by C-G formula based on SCr of 0.52 mg/dL (L)). Liver & Pancreas: Recent Labs  Lab 08/15/23 0415 08/16/23 0700 08/17/23 0554 08/18/23 0500 08/19/23 0423  AST 26 22 13* 109* 39  ALT 26 32 23 78* 55*  ALKPHOS 164* 222* 191* 472* 360*  BILITOT 0.9 0.8 0.7 3.3* 1.4*  PROT 4.9* 5.0*  4.9* 4.7* 5.6*  ALBUMIN 2.0* 1.9* 1.9* 1.7* 1.8*   No results for input(s): "LIPASE", "AMYLASE" in the last 168 hours. No results for input(s): "AMMONIA" in the last 168 hours. Diabetic: No results  for input(s): "HGBA1C" in the last 72 hours. No results for input(s): "GLUCAP" in the last 168 hours. Cardiac Enzymes: No results for input(s): "CKTOTAL", "CKMB", "CKMBINDEX", "TROPONINI" in the last 168 hours. No results for input(s): "PROBNP" in the last 8760 hours. Coagulation Profile: No results for input(s): "INR", "PROTIME" in the last 168 hours. Thyroid Function Tests: No results for input(s): "TSH", "T4TOTAL", "FREET4", "T3FREE", "THYROIDAB" in the last 72 hours. Lipid Profile: No results for input(s): "CHOL", "HDL", "LDLCALC", "TRIG", "CHOLHDL", "LDLDIRECT" in the last 72 hours. Anemia Panel: No results for input(s): "VITAMINB12", "FOLATE", "FERRITIN", "TIBC", "IRON", "RETICCTPCT" in the last 72 hours. Urine analysis:    Component Value Date/Time   COLORURINE AMBER BIOCHEMICALS MAY BE AFFECTED BY COLOR (A) 10/21/2009 1928   APPEARANCEUR CLEAR 10/21/2009 1928   LABSPEC 1.024 10/21/2009 1928   PHURINE 5.5 10/21/2009 1928   GLUCOSEU NEGATIVE 10/21/2009 1928   HGBUR NEGATIVE 10/21/2009 1928   BILIRUBINUR SMALL (A) 10/21/2009 1928   KETONESUR 15 (A) 10/21/2009 1928   PROTEINUR 30 (A) 10/21/2009 1928   UROBILINOGEN 1.0 10/21/2009 1928   NITRITE NEGATIVE 10/21/2009 1928   LEUKOCYTESUR TRACE (A) 10/21/2009 1928   Sepsis Labs: Invalid input(s): "PROCALCITONIN", "LACTICIDVEN"  Microbiology: Recent Results (from the past 240 hours)  Aerobic/Anaerobic Culture w Gram Stain (surgical/deep wound)     Status: None   Collection Time: 08/10/23  2:14 PM   Specimen: Abdomen; Abscess  Result Value Ref Range Status   Specimen Description ABDOMEN  Final   Special Requests ABSCESS  Final   Gram Stain   Final    ABUNDANT WBC PRESENT, PREDOMINANTLY PMN MODERATE GRAM NEGATIVE RODS     Culture   Final    ABUNDANT ESCHERICHIA COLI ABUNDANT FUSOBACTERIUM MORTIFERUM ABUNDANT BACTEROIDES FRAGILIS BETA LACTAMASE POSITIVE Performed at [facility], [address] [place], [place] [address]    Report Status 08/13/2023 FINAL  Final   Organism ID, Bacteria ESCHERICHIA COLI  Final      Susceptibility   Escherichia coli - MIC*    AMPICILLIN >=32 RESISTANT Resistant     CEFEPIME <=0.12 SENSITIVE Sensitive     CEFTAZIDIME <=1 SENSITIVE Sensitive     CEFTRIAXONE <=0.25 SENSITIVE Sensitive     CIPROFLOXACIN <=0.25 SENSITIVE Sensitive     GENTAMICIN >=16 RESISTANT Resistant     IMIPENEM <=0.25 SENSITIVE Sensitive     TRIMETH/SULFA >=320 RESISTANT Resistant     AMPICILLIN/SULBACTAM >=32 RESISTANT Resistant     PIP/TAZO >=128 RESISTANT Resistant ug/mL    * ABUNDANT ESCHERICHIA COLI    Radiology Studies: No results found.    Keniel Ralston T. Calise Dunckel [facility]  If 7PM-7AM, please contact night-coverage [email or web address] 08/20/2023, 10:36 AM

## 2023-08-20 NOTE — Progress Notes (Signed)
Mobility Specialist Progress Note:   08/20/23 1610  Mobility  Activity Ambulated with assistance in hallway  Level of Assistance Minimal assist, patient does 75% or more  Assistive Device Front wheel walker  Distance Ambulated (ft) 120 ft  Activity Response Tolerated well  Mobility Referral Yes  Mobility visit 1 Mobility  Mobility Specialist Start Time (ACUTE ONLY) 1610  Mobility Specialist Stop Time (ACUTE ONLY) 1625  Mobility Specialist Time Calculation (min) (ACUTE ONLY) 15 min   Pt agreeable to mobility session. Required up to minA at times d/t unsteadiness. Requiring min cues for posture and hand placement. Continues to have soft BP (see below), however asx. Pt left in chair with all needs met, alarm on.  Pre mobility 91/64 (73) Immediately post mobility: 76/57 (65) Reclined in chair: 90/63 (71)  Addison Lank Mobility Specialist Please contact via Special educational needs teacher or  Rehab office at [phone]

## 2023-08-20 NOTE — Plan of Care (Signed)
Problem: Education: Goal: Knowledge of General Education information will improve Description Including pain rating scale, medication(s)/side effects and non-pharmacologic comfort measures Outcome: Progressing   Problem: Health Behavior/Discharge Planning: Goal: Ability to manage health-related needs will improve Outcome: Progressing   Problem: Clinical Measurements: Goal: Ability to maintain clinical measurements within normal limits will improve Outcome: Progressing Goal: Will remain free from infection Outcome: Progressing Goal: Diagnostic test results will improve Outcome: Progressing Goal: Cardiovascular complication will be avoided Outcome: Progressing   Problem: Activity: Goal: Risk for activity intolerance will decrease Outcome: Progressing   Problem: Coping: Goal: Level of anxiety will decrease Outcome: Progressing

## 2023-08-20 NOTE — Care Management Important Message (Signed)
Important Message  Patient Details  Name: Francisco Padilla MRN: [id number] Date of Birth: [birth year]   Important Message Given:  Yes - Medicare IM     Renie Ora 08/20/2023, 10:54 AM

## 2023-08-20 NOTE — TOC Progression Note (Addendum)
Transition of Care [facility]) - Progression Note    Patient Details  Name: Francisco Padilla MRN: [id number] Date of Birth: 11/21/1950  Transition of Care [facility] CM/SW Contact  Michaela Corner, [place] Phone Number: 08/20/2023, 10:32 AM  Clinical Narrative:   CSW spoke with Herbert Seta at [facility] [phone] - may or may not need to use the ext) regarding bed availability. Per Janeice Robinson has "quite a few beds available" and have [name] on their list for a perspective admission. CSW notified MD.   TOC will continue to follow.   Expected Discharge Plan: Skilled Nursing Facility Barriers to Discharge: Continued Medical Work up  Expected Discharge Plan and Services In-house Referral: NA Discharge Planning Services: CM Consult Post Acute Care Choice: NA Living arrangements for the past 2 months: Single Family Home                 DME Arranged: N/A DME Agency: NA       HH Arranged: NA           Social Determinants of Health (SDOH) Interventions SDOH Screenings   Food Insecurity: No Food Insecurity (07/29/2023)  Housing: Low Risk  (07/29/2023)  Transportation Needs: No Transportation Needs (07/29/2023)  Utilities: Not At Risk (07/29/2023)  Social Connections: Patient Declined (07/31/2023)  Tobacco Use: Low Risk  (08/01/2023)    Readmission Risk Interventions     No data to display

## 2023-08-20 NOTE — Progress Notes (Signed)
Dr. Azucena Cecil message this writer to cap PTC.  Emptied 450cc and capped. Informed patient to call if patient develop pain and nausea.

## 2023-08-20 NOTE — Progress Notes (Signed)
Physical Therapy Treatment Patient Details Name: Francisco Padilla MRN: [id number] DOB: Dec 30, 1950 Today's Date: 08/20/2023   History of Present Illness Pt is a 73 y/o male admitted for sepsis due to acute cholecystitis and AKI. Gangrenous cholecystitis s/p lap subtotal cholecystectomy 1/8. MRCP 1/15 revealed an undrained fluid collection that the surgical drain traverses, but is unable to completely evacuate. IR placed 2 perc drains and PTC drain 1/17. 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 Pt progressed distance today ambulating in hallway 125 x 2 with RW with min assist due to pt fatigues and posture and balance worsen when pt is fatigued. Pt needs constant cues for safety awareness.  Pt continues with soft BP yet was asymptomatic today.  Continue to progress pt as able.   Pt currently with functional limitations due to the deficits listed below (see PT Problem List). Pt will benefit from acute skilled PT to increase their independence and safety with mobility to allow discharge.       If plan is discharge home, recommend the following: Assistance with cooking/housework;Direct supervision/assist for medications management;Direct supervision/assist for financial management;Assist for transportation;Supervision due to cognitive status;Help with stairs or ramp for entrance;A lot of help with walking and/or transfers;A lot of help with bathing/dressing/bathroom   Can travel by private vehicle     No  Equipment Recommendations  Rolling walker (2 wheels);BSC/3in1    Recommendations for Other Services       Precautions / Restrictions Precautions Precautions: Fall Precaution Comments: 2 JP drains on R. Pleural drain right. Chronically low BP. Restrictions Weight Bearing Restrictions Per Provider Order: No     Mobility  Bed Mobility Overal bed mobility: Needs Assistance Bed Mobility: Supine  to Sit, Sit to Supine     Supine to sit: Min assist Sit to supine: Supervision   General bed mobility comments: HOB slightly elevated. Pt required assistance to manage lines/leads.  Pt with posterior lean sitting EOB and was not trying to correct balance.    Transfers Overall transfer level: Needs assistance Equipment used: Rolling walker (2 wheels) Transfers: Sit to/from Stand Sit to Stand: Contact guard assist           General transfer comment: Pt demonstrate appropriate sequencing and use of RW for STS from EOB.    Ambulation/Gait Ambulation/Gait assistance: +2 safety/equipment, Min assist Gait Distance (Feet): 125 Feet (125 feet x 2) Assistive device: Rolling walker (2 wheels) Gait Pattern/deviations: Decreased step length - left, Decreased step length - right, Trunk flexed, Scissoring, Narrow base of support, Leaning posteriorly Gait velocity: reduced Gait velocity interpretation: 1.31 - 2.62 ft/sec, indicative of limited community ambulator Pre-gait activities: Engineer, water march, weight shifting. General Gait Details: Pt's gait was inconsistent with pt stable at times and then posture would worsen needing cues and assist; therefore, varying his step length and BOS. Needed min assist when turning around as he gets feet crossed up with LOB.  Had to get pt to sit down to rest and then he walked back to room. Cues given to focus pt on controlled gait pattern.  Pt maintained body inside RW, but was positioned closer to the R side and pt flexes at trunk and knees as he fatigues. Can correct with cues but needs cues again as he doesnt sustain upright posture.   Stairs             Psychologist, prison and probation services  Tilt Bed    Modified Rankin (Stroke Patients Only)       Balance Overall balance assessment: Needs assistance Sitting-balance support: Feet supported, Bilateral upper extremity supported Sitting balance-Leahy Scale: Poor Sitting balance - Comments: Pt biased posterior  and slightly left, c/o pain on R side. Able to correct to neutral posture with cueing. Postural control: Posterior lean Standing balance support: Bilateral upper extremity supported, During functional activity, Reliant on assistive device for balance Standing balance-Leahy Scale: Poor Standing balance comment: Pt uses RW for OOB mobility. Pt with LOB in hallway and needed minA. Educated pt to maintain BUE in contact with RW when ambulating.                            Cognition Arousal: Alert Behavior During Therapy: WFL for tasks assessed/performed Overall Cognitive Status: Impaired/Different from baseline Area of Impairment: Safety/judgement, Orientation                 Orientation Level: Person (Pt sated name and DOB correctly but reported his age as 73 y.o.)   Memory: Decreased short-term memory   Safety/Judgement: Decreased awareness of safety, Decreased awareness of deficits   Problem Solving: Slow processing General Comments: Re-direction and re-orientation to task required to increase pt's focus and maintain safety.        Exercises General Exercises - Lower Extremity Ankle Circles/Pumps: AROM, Both, 10 reps, Seated Long Arc Quad: AROM, Both, 10 reps, Seated Hip Flexion/Marching: AROM, Both, 10 reps, Standing    General Comments General comments (skin integrity, edema, etc.): 99%RA, supine 94/71; sitting 90/55, standing 81/53; after walk 82/56.  end rx 97/61      Pertinent Vitals/Pain Pain Assessment Pain Assessment: No/denies pain    Home Living                          Prior Function            PT Goals (current goals can now be found in the care plan section) Progress towards PT goals: Progressing toward goals    Frequency    Min 1X/week      PT Plan      Co-evaluation              AM-PAC PT "6 Clicks" Mobility   Outcome Measure  Help needed turning from your back to your side while in a flat bed without using  bedrails?: A Little Help needed moving from lying on your back to sitting on the side of a flat bed without using bedrails?: A Little Help needed moving to and from a bed to a chair (including a wheelchair)?: A Little Help needed standing up from a chair using your arms (e.g., wheelchair or bedside chair)?: A Little Help needed to walk in hospital room?: Total Help needed climbing 3-5 steps with a railing? : A Lot 6 Click Score: 15    End of Session Equipment Utilized During Treatment: Gait belt Activity Tolerance: Patient limited by fatigue Patient left: in chair;with call bell/phone within reach;with chair alarm set Nurse Communication: Mobility status PT Visit Diagnosis: Other abnormalities of gait and mobility (R26.89);Muscle weakness (generalized) (M62.81);Unsteadiness on feet (R26.81);Difficulty in walking, not elsewhere classified (R26.2)     Time: 1331-1410 PT Time Calculation (min) (ACUTE ONLY): 39 min  Charges:    $Gait Training: 23-37 mins $Therapeutic Exercise: 8-22 mins PT General Charges $$ ACUTE PT VISIT: 1 Visit  [facility] M,PT Acute Rehab Services [phone]    Bevelyn Buckles 08/20/2023, 3:47 PM

## 2023-08-20 NOTE — Progress Notes (Signed)
19 Days Post-Op   Subjective/Chief Complaint: Patient feeling better today, wants to know plan for moving forward/discharge  Objective: Vital signs in last 24 hours: Temp:  [97.6 F (36.4 C)-98.4 F (36.9 C)] 98.4 F (36.9 C) (01/27 1619) Pulse Rate:  [80-107] 80 (01/27 1619) Resp:  [17-20] 20 (01/27 1619) BP: (92-109)/(59-73) 95/65 (01/27 1027) SpO2:  [97 %-100 %] 99 % (01/27 1619) Weight:  [91.1 kg] 91.1 kg (01/27 0453) Last BM Date : 08/20/23  Intake/Output from previous day: 01/26 0701 - 01/27 0700 In: 1230 [P.O.:720; IV Piggyback:500] Out: 4167.5 [Urine:1900; Drains:2267.5] Intake/Output this shift: Total I/O In: 245 [P.O.:240; Other:5] Out: 600 [Drains:600]  Gen: NAD Heart: irregular Abd: soft, incisions c/d/I, both perc drains purulent output, PTC cap  Lab Results:  Recent Labs    08/19/23 0423 08/20/23 1201  WBC 11.7* 13.7*  HGB 8.7* 9.1*  HCT 26.4* 27.7*  PLT 264 311   BMET Recent Labs    08/19/23 0423 08/20/23 1201  NA 134* 134*  K 4.2 4.1  CL 102 100  CO2 26 26  GLUCOSE 96 113*  BUN 19 19  CREATININE 0.52* 0.66  CALCIUM 8.6* 8.5*      Latest Ref Rng & Units 08/20/2023   12:01 PM 08/19/2023    4:23 AM 08/18/2023    5:00 AM  Hepatic Function  Total Protein 6.5 - 8.1 g/dL 5.8  5.6  4.7   Albumin 3.5 - 5.0 g/dL 1.8  1.8  1.7   AST 15 - 41 U/L 18  39  109   ALT 0 - 44 U/L 35  55  78   Alk Phosphatase 38 - 126 U/L 264  360  472   Total Bilirubin 0.0 - 1.2 mg/dL 1.1  1.4  3.3      Anti-infectives: Anti-infectives (From admission, onward)    Start     Dose/Rate Route Frequency Ordered Stop   08/15/23 1615  cefTRIAXone (ROCEPHIN) 2 g in sodium chloride 0.9 % 100 mL IVPB        2 g 200 mL/hr over 30 Minutes Intravenous To Radiology 08/15/23 1525 08/15/23 1643   08/13/23 0800  meropenem (MERREM) 1 g in sodium chloride 0.9 % 100 mL IVPB        1 g 200 mL/hr over 30 Minutes Intravenous Every 8 hours 08/13/23 0714     08/09/23 1400   cefTRIAXone (ROCEPHIN) 2 g in sodium chloride 0.9 % 100 mL IVPB  Status:  Discontinued        2 g 200 mL/hr over 30 Minutes Intravenous To Radiology 08/09/23 1254 08/10/23 0716   08/09/23 1245  cefTRIAXone (ROCEPHIN) 2 g in sodium chloride 0.9 % 100 mL IVPB  Status:  Discontinued       Note to Pharmacy: Please do not give on floor. To be given in IR.   2 g 200 mL/hr over 30 Minutes Intravenous To Radiology 08/09/23 1152 08/09/23 1213   08/07/23 1100  piperacillin-tazobactam (ZOSYN) IVPB 3.375 g  Status:  Discontinued        3.375 g 12.5 mL/hr over 240 Minutes Intravenous Every 8 hours 08/07/23 1008 08/13/23 0714   08/07/23 0000  piperacillin-tazobactam (ZOSYN) 3.375 GM/50ML IVPB        3.375 g Intravenous Every 8 hours 08/07/23 1501     07/29/23 0800  piperacillin-tazobactam (ZOSYN) IVPB 3.375 g        3.375 g 12.5 mL/hr over 240 Minutes Intravenous Every 8 hours 07/29/23 0207 08/06/23 2359  07/29/23 0030  piperacillin-tazobactam (ZOSYN) IVPB 3.375 g        3.375 g 100 mL/hr over 30 Minutes Intravenous  Once 07/29/23 0020 07/29/23 0147       Assessment/Plan: s/p subtotal cholecystectomy for gangrenous cholecystitis, Dr. Hillery Hunter 08/01/23 -regular diet -cont abx therapy Labs pending Do not uncap PTC unless having increasing abdominal pain/nausea. If this occurs would repeat KUB for placement -cont JP drains as they are continuing to drain this subhepatic fluid collection -Eliquis restarted -will follow up with Dr. Hillery Hunter and IR as outpatient.     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  They also have no beds and unclear at what point that may change to even accept the patient.  Their recommendation was to have IR place a PTC drain so this stents his CBD and essentially accomplishes the same goal in a safe technique.     FEN -regular VTE - Eliquis ID - continue meropenem    A fib AKI Peripheral neuropathy GERD HLD Hx of gastric bypass  LOS: 22 days    Lysle Rubens, MD 08/20/2023

## 2023-08-20 NOTE — Progress Notes (Signed)
RN was notified by general surgery PA Maczis this afternoon to cap patient's PTC. Patient has 3 drains; one to gravity bag and two to charged JP bulbs. Notified PA and then Dr. Azucena Cecil to clarify, as well to let them be aware that drainage out to the gravity bag put out a total of 1200cc today. It had some old blood clots inside the gravity drainage bag this morning which RN dissolved when emptying. Yellow/green bilious drainage all day today to the gravity drainage bag.  Dr. Janee Morn returned RN's page and gave verbal order to hold off on capping off PTC today and the team can address it tomorrow. Reported this off to incoming nightshift RN during shift change.

## 2023-08-21 DIAGNOSIS — K810 Acute cholecystitis: Secondary | ICD-10-CM | POA: Diagnosis not present

## 2023-08-21 DIAGNOSIS — I4891 Unspecified atrial fibrillation: Secondary | ICD-10-CM | POA: Diagnosis not present

## 2023-08-21 DIAGNOSIS — R6510 Systemic inflammatory response syndrome (SIRS) of non-infectious origin without acute organ dysfunction: Secondary | ICD-10-CM | POA: Diagnosis not present

## 2023-08-21 DIAGNOSIS — N179 Acute kidney failure, unspecified: Secondary | ICD-10-CM | POA: Diagnosis not present

## 2023-08-21 LAB — COMPREHENSIVE METABOLIC PANEL
ALT: 27 U/L (ref 0–44)
AST: 16 U/L (ref 15–41)
Albumin: 1.8 g/dL — ABNORMAL LOW (ref 3.5–5.0)
Alkaline Phosphatase: 209 U/L — ABNORMAL HIGH (ref 38–126)
Anion gap: 6 (ref 5–15)
BUN: 15 mg/dL (ref 8–23)
CO2: 27 mmol/L (ref 22–32)
Calcium: 8.6 mg/dL — ABNORMAL LOW (ref 8.9–10.3)
Chloride: 101 mmol/L (ref 98–111)
Creatinine, Ser: 0.57 mg/dL — ABNORMAL LOW (ref 0.61–1.24)
GFR, Estimated: >60 mL/min (ref >60)
Glucose, Bld: 102 mg/dL — ABNORMAL HIGH (ref 70–99)
Potassium: 4.1 mmol/L (ref 3.5–5.1)
Sodium: 134 mmol/L — ABNORMAL LOW (ref 135–145)
Total Bilirubin: 1 mg/dL (ref 0.0–1.2)
Total Protein: 5.7 g/dL — ABNORMAL LOW (ref 6.5–8.1)

## 2023-08-21 LAB — CBC
HCT: 25.9 % — ABNORMAL LOW (ref 39.0–52.0)
Hemoglobin: 8.5 g/dL — ABNORMAL LOW (ref 13.0–17.0)
MCH: 29.8 pg (ref 26.0–34.0)
MCHC: 32.8 g/dL (ref 30.0–36.0)
MCV: 90.9 fL (ref 80.0–100.0)
Platelets: 316 10*3/uL (ref 150–400)
RBC: 2.85 MIL/uL — ABNORMAL LOW (ref 4.22–5.81)
RDW: 13.9 % (ref 11.5–15.5)
WBC: 13.6 10*3/uL — ABNORMAL HIGH (ref 4.0–10.5)
nRBC: 0 % (ref 0.0–0.2)

## 2023-08-21 MED ORDER — TAMSULOSIN HCL 0.4 MG PO CAPS
0.4000 mg | ORAL_CAPSULE | Freq: Every day | ORAL | Status: DC
  Administered 2023-08-22 – 2023-08-25 (×4): 0.4 mg via ORAL
  Filled 2023-08-21 (×4): qty 1

## 2023-08-21 MED ORDER — METRONIDAZOLE 500 MG PO TABS
500.0000 mg | ORAL_TABLET | Freq: Two times a day (BID) | ORAL | Status: DC
  Administered 2023-08-21 – 2023-08-25 (×8): 500 mg via ORAL
  Filled 2023-08-21 (×8): qty 1

## 2023-08-21 MED ORDER — AMPHETAMINE-DEXTROAMPHETAMINE 10 MG PO TABS: 5.0000 mg | ORAL_TABLET | Freq: Every day | ORAL | Status: DC | PRN

## 2023-08-21 MED ORDER — SODIUM CHLORIDE 0.9 % IV SOLN
2.0000 g | INTRAVENOUS | Status: DC
  Administered 2023-08-21 – 2023-08-24 (×4): 2 g via INTRAVENOUS
  Filled 2023-08-21 (×4): qty 20

## 2023-08-21 NOTE — Consult Note (Addendum)
I have seen and examined the patient. I have personally reviewed the clinical findings, laboratory findings, microbiological data and imaging studies. The assessment and treatment plan was discussed with the Nurse Practitioner. I agree with her/his recommendations except following additions/corrections.  # Post op GB fossa abscess/subhepatic fluid collection s/p lap cholecystectomy - 1/17 per cut bilary drain, Cx with E coli, bacteroides, fusobacterium mortiferum - General surgery and IR following and on PTC capping trial  Exam - elderly male sitting in the bed, HEENT wnl. Heart lung wnl. Abdomen soft, 2 drains in place ( 1 with bile and another bulb with purulent fluid). MSK - no pedal edema, No rashes. Awake, alert and oriented, appears frustrated with prolonged hospitalization.   Plan  Will switch Iv meropenem to IV ceftriaxone and metronidazole ( E coli is not ESBL, S to ceftriaxone) while inpatient Monitor CBC and CMP on abtx Drain management per surgery and IR team  If continues to clinically improve in next 1-2 days ands no further intervention planned from surgical team, could switch to PO ciprofloxacin and metronidazole. Last qtc 443. Plan for 4 weeks course on discharge.  Will arrange for fu OP. ID available as needed, recall back with questions or concerns.   I have personally spent 80 minutes involved in face-to-face and non-face-to-face activities for this patient on the day of the visit. Professional time spent includes the following activities: Preparing to see the patient (review of tests), Obtaining and/or reviewing separately obtained history (admission/discharge record), Performing a medically appropriate examination and/or evaluation , Ordering medications/tests/procedures, referring and communicating with other health care professionals, Documenting clinical information in the EMR, Independently interpreting results (not separately reported), Communicating results to the  patient/family/caregiver, Counseling and educating the patient/family/caregiver and Care coordination (not separately reported).     [facility]    Date of Admission:  07/28/2023      Total days of antibiotics 23   Meropenem 1/20 >> c                Reason for Consult: Subhepatic Abscess complicating cholecystectomy     Referring Provider: Alanda Slim Primary Care Provider: [name], MD   Assessment: Francisco Padilla is a 73 y.o. male admitted with:   Gangrenous Cholecystitis - Biliary Leak/Abscess -  E coli (R TMP/S, Pip-Tazo, Amp-Sulb), Fusobacterium, Bacteroides -  5.5 x 10.5 cm abscess noted on scan 1/22 with drain placement. Still with high output that is purulent appearing. Planning to stay inpatient another 48h for capping trial then IR clinic outpatient. WBC hoovering aroudn 13K. Afebrile.  Will continue meropenem for now but I think we can switch to ceftriaxone + metronidazole to cover adequately - not ESBL given full susceptibility to cephalosporin class.  Explained the duration of abx will depend on the progress of the infection being adequately drained. Chole drain is currently capped for clamping trial x 48h.   Vascular Access -  PICC in place   Discharge Plan -  Skilled rehab I believe in talking with Dr. Alanda Slim.  He brought up transfer to [facility] or [facility] with [place] today which I was not aware of as part of his current plan.  He is frustrated     Plan: Switch to ceftriaxone and metronidazole for treatment  Duration abx pending drain duration  Will follow   Principal Problem:   Acute cholecystitis Active Problems:   OSA on CPAP   History of Roux-en-Y gastric bypass   AKI (acute kidney injury) (  HCC)   Aortic stenosis, severe   Atrial fibrillation with RVR (HCC)   Protein-calorie malnutrition, severe    acetaminophen  1,000 mg Oral Q6H   Or   acetaminophen  650 mg Rectal Q6H   acidophilus  1 capsule Oral Daily   apixaban  5 mg  Oral BID   Chlorhexidine Gluconate Cloth  6 each Topical Daily   docusate sodium  100 mg Oral BID   feeding supplement  237 mL Oral BID BM   fludrocortisone  0.1 mg Oral Daily   gabapentin  600 mg Oral QHS   latanoprost  1 drop Both Eyes QHS   midodrine  15 mg Oral TID WC   multivitamin with minerals  1 tablet Oral BID   nutrition supplement (JUVEN)  1 packet Oral BID BM   pantoprazole  40 mg Oral BID   polyethylene glycol  17 g Oral Daily   sodium chloride flush  3 mL Intravenous Q12H   sodium chloride flush  5 mL Intracatheter Q8H   [START ON 08/22/2023] tamsulosin  0.4 mg Oral QPC breakfast   venlafaxine XR  37.5 mg Oral Q breakfast   zolpidem  10 mg Oral QHS    HPI: Francisco Padilla is a 73 y.o. male admitted to [facility] for weakness from Home on 08/21/23.   PMHx OSA, HTN, PAF on DOAC, PVD, Bariatric surgery, Severe AS.    Seen in ER on 1/5 with generalized weakness and abdominal pain admitted with cholecystitis noted on CT A/P. Lap Chole on 1/8 with General surgery team. Required ICU stay for SIRS/Sepsis and hypotension management. Image concerning for biliary leak and possible subhepatic abscess. Perc drain placed 1/17 with cultures positive for E Coli >> ABX changed to meropenem on 1/20. Attempted to cap drain on 1/20 that failed. Cap trial again on 1/27 though still with significant purulent output from drain.   We have been consulted for antibiotic guidance.    Review of Systems: Review of Systems  Constitutional:  Negative for fever, malaise/fatigue and weight loss.  Respiratory:  Negative for cough, sputum production and shortness of breath.   Cardiovascular:  Negative for chest pain.  Genitourinary: Negative.   Musculoskeletal: Negative.   Skin:  Negative for rash.    Past Medical History:  Diagnosis Date   ADD (attention deficit disorder)    Anxiety 2007   Aortic stenosis    contributor to low systolic BP   Asthma as child   Asymptomatic gallstones    Atrial  fibrillation (HCC)    Atrial flutter (HCC)    Chronic hypotension    Per Pt historical systolic BP in 80's   Depression    DJD (degenerative joint disease) of knee    Dysrhythmia    Chronic Atrial Fib- seeing Dr. Shela Commons [facility]   H/O peptic ulcer    past history with GI bleed- cauterization done throught scope   History of kidney stones    pt claims urology discovered a kidney stone last week 05/25/21   Hypercholesterolemia    Orthostatic hypotension    Pneumonia as child   Pneumothorax on left 05/20/2009   Prediabetes    none now   Pulmonary nodules    Sleep apnea    cpap set on 13   Thrombocytopenia (HCC)    pt denies   Thyromegaly    Varicose veins    Past Surgical History:  Procedure Laterality Date   BIOPSY  04/18/2018   Procedure: BIOPSY;  Surgeon: Kerin Salen, MD;  Location: [facility];  Service: Gastroenterology;;   CHOLECYSTECTOMY N/A 08/01/2023   Procedure: LAPAROSCOPIC CHOLECYSTECTOMY WITH ICG DYE;  Surgeon: Moise Boring, MD;  Location: [facility] OR;  Service: General;  Laterality: N/A;   ESOPHAGOGASTRODUODENOSCOPY N/A 04/18/2018   Procedure: ESOPHAGOGASTRODUODENOSCOPY (EGD);  Surgeon: Kerin Salen, MD;  Location: [facility];  Service: Gastroenterology;  Laterality: N/A;   ESOPHAGOGASTRODUODENOSCOPY (EGD) WITH PROPOFOL N/A 01/05/2015   Procedure: ESOPHAGOGASTRODUODENOSCOPY (EGD) WITH PROPOFOL;  Surgeon: Charolett Bumpers, MD;  Location: [facility];  Service: Endoscopy;  Laterality: N/A;   EXTRACORPOREAL SHOCK WAVE LITHOTRIPSY Left 08/11/2021   Procedure: LEFT EXTRACORPOREAL SHOCK WAVE LITHOTRIPSY (ESWL);  Surgeon: Jannifer Hick, MD;  Location: [facility];  Service: Urology;  Laterality: Left;   GASTRIC ROUX-EN-Y     '08-[name] (weight stable around 302 #   IR BILIARY DRAIN PLACEMENT WITH CHOLANGIOGRAM  08/10/2023   IR EXCHANGE BILIARY DRAIN  08/15/2023   IR US GUIDE BX ASP/DRAIN  08/10/2023   IR US GUIDE BX ASP/DRAIN  08/15/2023   KNEE  ARTHROSCOPY     NASAL SEPTOPLASTY W/ TURBINOPLASTY Bilateral 11/13/2022   Procedure: NASAL SEPTOPLASTY WITH BILATERAL TURBINATE REDUCTION;  Surgeon: Newman Pies, MD;  Location: [facility];  Service: ENT;  Laterality: Bilateral;   TONSILLECTOMY     TOTAL KNEE ARTHROPLASTY Left 06/03/2021   Procedure: LEFT TOTAL KNEE ARTHROPLASTY;  Surgeon: Nadara Mustard, MD;  Location: [facility] OR;  Service: Orthopedics;  Laterality: Left;   ULNAR NERVE TRANSPOSITION Right 15 yrs ago    Social History   Tobacco Use   Smoking status: Never   Smokeless tobacco: Never  Vaping Use   Vaping status: Never Used  Substance Use Topics   Alcohol use: No    Alcohol/week: 0.0 standard drinks of alcohol   Drug use: Never    Family History  Problem Relation Age of Onset   Heart disease Mother    Heart failure Mother    Heart disease Father    Heart attack Father    Stroke Neg Hx    Allergies  Allergen Reactions   Aspirin     Avoid due to bariatric surgery   Bupropion Other (See Comments)    "Passed out"   Nsaids     Avoid due to bariatric surgery   Oxycodone Hcl     Hallucinations, agitation     OBJECTIVE: Blood pressure (!) 85/57, pulse 91, temperature 98.3 F (36.8 C), temperature source Oral, resp. rate 19, height 6\' 4"  (1.93 m), weight 91.1 kg, SpO2 99%.  Physical Exam Cardiovascular:     Rate and Rhythm: Normal rate.  Pulmonary:     Effort: Pulmonary effort is normal.     Breath sounds: Normal breath sounds.  Musculoskeletal:     Cervical back: Normal range of motion.  Skin:    General: Skin is warm and dry.  Neurological:     Mental Status: He is alert and oriented to person, place, and time.     Lab Results Lab Results  Component Value Date   WBC 13.6 (H) 08/21/2023   HGB 8.5 (L) 08/21/2023   HCT 25.9 (L) 08/21/2023   MCV 90.9 08/21/2023   PLT 316 08/21/2023    Lab Results  Component Value Date   CREATININE 0.57 (L) 08/21/2023   BUN 15 08/21/2023   NA 134 (L)  08/21/2023   K 4.1 08/21/2023   CL 101 08/21/2023   CO2 27 08/21/2023  Lab Results  Component Value Date   ALT 27 08/21/2023   AST 16 08/21/2023   ALKPHOS 209 (H) 08/21/2023   BILITOT 1.0 08/21/2023     Microbiology: No results found for this or any previous visit (from the past 240 hours).  Imaging DG Abd 1 View Result Date: 08/18/2023 CLINICAL DATA:  Bile leak scratched at postoperative bile leak. Acute cholecystitis. EXAM: ABDOMEN - 1 VIEW COMPARISON:  IR procedure film 08/15/2023. CT of the abdomen and pelvis 08/15/2023. FINDINGS: Bowel gas pattern is normal.  Right hemidiaphragm remains elevated. The biliary drain is stable in position. A pigtail drainage catheter and large bore right-sided drain are stable. IMPRESSION: 1. Stable position of biliary drain. 2. Stable position of pigtail drainage catheter and large bore right-sided drain. 3. No acute abnormality. Electronically Signed   By: Marin Roberts M.D.   On: 08/18/2023 11:45   IR US Guide Bx Asp/Drain Result Date: 08/15/2023 INDICATION: 73 year old male with history of gangrenous cholecystitis status post partial cholecystectomy necessitating percutaneous biliary drain placement and biloma drain placement on 08/10/2023. Capping trial failed with significantly increased abdominal pain and sepsis. CT abdomen pelvis demonstrates malpositioned biliary drain and perihepatic fluid. EXAM: 1. Biliary drain check and exchange. 2. Percutaneous aspiration of perihepatic fluid. MEDICATIONS: Rocephin 2 gm IV; The antibiotic was administered within an appropriate time frame prior to the initiation of the procedure. ANESTHESIA/SEDATION: None. FLUOROSCOPY TIME:  24.5 mGy COMPLICATIONS: None immediate. PROCEDURE: Informed written consent was obtained from the patient after a thorough discussion of the procedural risks, benefits and alternatives. All questions were addressed. Maximal Sterile Barrier Technique was utilized including caps, mask,  sterile gowns, sterile gloves, sterile drape, hand hygiene and skin antiseptic. A timeout was performed prior to the initiation of the procedure. Procedure scout radiograph of the right upper quadrant demonstrated kinking about the perihepatic aspect of the indwelling biliary drain with the radiopaque side hole near the periphery of the liver. Subdermal Local anesthesia was provided at the planned needle entry site with 1% lidocaine. Contrast injection demonstrated patency of the drain, however there was spillage into the perihepatic space. No definite evidence of biliary ductal dilation. The external portion of the drain was cut to release inter pigtail. An Amplatz wire was then inserted. The drain was removed over the wire. A Kumpe the catheter was placed over the wire and directed into the duodenum. Hand injection of contrast via catheter demonstrated intraluminal position. The Amplatz wire was then reinserted and the 5 French catheter was exchanged for a new, 12 [place], 40 cm biliary drain. The radiopaque marker was positioned just lateral to the hilum. The pigtail portion was formed in the duodenum. Hand injection of contrast demonstrated patency of the indwelling drain with persistent bile leak in the region of the proximal common bile duct. Ultrasound evaluation of the right upper quadrant demonstrated a small heterogeneously hypoechoic collection surrounding liver. Under direct ultrasound visualization, a 19 gauge, 7 cm Yueh catheter was directed into the collection. Aspiration was performed which showed yielded trace, thick bilious and bloody fluid. The fluid is very difficult to aspirate given gelatinous nature. The Yueh catheter was removed. The drain was affixed to the skin with an interrupted 0 silk suture. Sterile bandage was applied. The patient tolerated the procedure well was transferred back to the floor in good condition. IMPRESSION: 1. Technically successful exchange and repositioning of  malpositioned indwelling 12 [place] biliary drain. 2. Attempted perihepatic fluid aspiration yielded trace thick bilious and bloody fluid. Given prior  drain malposition, this fluid likely represents bilious leakage which should resolve spontaneously via indwelling drainage catheters. Marliss Coots, MD [facility] Electronically Signed   By: Marliss Coots M.D.   On: 08/15/2023 22:05   IR EXCHANGE BILIARY DRAIN Result Date: 08/15/2023 INDICATION: 73 year old male with history of gangrenous cholecystitis status post partial cholecystectomy necessitating percutaneous biliary drain placement and biloma drain placement on 08/10/2023. Capping trial failed with significantly increased abdominal pain and sepsis. CT abdomen pelvis demonstrates malpositioned biliary drain and perihepatic fluid. EXAM: 1. Biliary drain check and exchange. 2. Percutaneous aspiration of perihepatic fluid. MEDICATIONS: Rocephin 2 gm IV; The antibiotic was administered within an appropriate time frame prior to the initiation of the procedure. ANESTHESIA/SEDATION: None. FLUOROSCOPY TIME:  24.5 mGy COMPLICATIONS: None immediate. PROCEDURE: Informed written consent was obtained from the patient after a thorough discussion of the procedural risks, benefits and alternatives. All questions were addressed. Maximal Sterile Barrier Technique was utilized including caps, mask, sterile gowns, sterile gloves, sterile drape, hand hygiene and skin antiseptic. A timeout was performed prior to the initiation of the procedure. Procedure scout radiograph of the right upper quadrant demonstrated kinking about the perihepatic aspect of the indwelling biliary drain with the radiopaque side hole near the periphery of the liver. Subdermal Local anesthesia was provided at the planned needle entry site with 1% lidocaine. Contrast injection demonstrated patency of the drain, however there was spillage into the  perihepatic space. No definite evidence of biliary ductal dilation. The external portion of the drain was cut to release inter pigtail. An Amplatz wire was then inserted. The drain was removed over the wire. A Kumpe the catheter was placed over the wire and directed into the duodenum. Hand injection of contrast via catheter demonstrated intraluminal position. The Amplatz wire was then reinserted and the 5 French catheter was exchanged for a new, 12 [place], 40 cm biliary drain. The radiopaque marker was positioned just lateral to the hilum. The pigtail portion was formed in the duodenum. Hand injection of contrast demonstrated patency of the indwelling drain with persistent bile leak in the region of the proximal common bile duct. Ultrasound evaluation of the right upper quadrant demonstrated a small heterogeneously hypoechoic collection surrounding liver. Under direct ultrasound visualization, a 19 gauge, 7 cm Yueh catheter was directed into the collection. Aspiration was performed which showed yielded trace, thick bilious and bloody fluid. The fluid is very difficult to aspirate given gelatinous nature. The Yueh catheter was removed. The drain was affixed to the skin with an interrupted 0 silk suture. Sterile bandage was applied. The patient tolerated the procedure well was transferred back to the floor in good condition. IMPRESSION: 1. Technically successful exchange and repositioning of malpositioned indwelling 12 [place] biliary drain. 2. Attempted perihepatic fluid aspiration yielded trace thick bilious and bloody fluid. Given prior drain malposition, this fluid likely represents bilious leakage which should resolve spontaneously via indwelling drainage catheters. Marliss Coots, MD [facility] Electronically Signed   By: Marliss Coots M.D.   On: 08/15/2023 22:05   CT ABDOMEN PELVIS W CONTRAST Result Date: 08/15/2023 CLINICAL DATA:  Intra-abdominal  abscess. EXAM: CT ABDOMEN AND PELVIS WITH CONTRAST TECHNIQUE: Multidetector CT imaging of the abdomen and pelvis was performed using the standard protocol following bolus administration of intravenous contrast. RADIATION DOSE REDUCTION: This exam was performed according to the departmental dose-optimization program which includes automated exposure control, adjustment of the mA and/or kV according to patient size and/or  use of iterative reconstruction technique. CONTRAST:  75mL OMNIPAQUE IOHEXOL 350 MG/ML SOLN COMPARISON:  CT of the abdomen pelvis dated 07/28/2023. FINDINGS: Lower chest: Partially visualized bilateral pleural effusions, right greater than left with associated partial compressive atelectasis of the lower lobes versus pneumonia. This is new since the prior CT. There is 3 vessel coronary vascular calcification. The tip of a central venous line noted at the cavoatrial junction. Tiny pockets of air adjacent to the liver, likely introduced via catheters. There is a small ascites new or significantly progressed since the prior CT. Hepatobiliary: Irregular liver contour suggestive of cirrhosis. Cholecystectomy. There is a 5.5 x 10.5 cm complex collection containing air at the cholecystectomy bed consistent with an abscess. Percutaneous transhepatic internal/external drain with tip in the region of the second portion of the duodenum and a percutaneous subhepatic drainage catheter with tip in the collection/abscess noted. Pancreas: The pancreas is moderately atrophic and poorly visualized. Spleen: Normal in size without focal abnormality. Adrenals/Urinary Tract: The adrenal glands unremarkable. Multiple nonobstructing bilateral renal calculi measure up to 5 mm in the interpolar right kidney. There is a 9 mm stone in the distal left ureter with mild left hydronephrosis. The right ureter and urinary bladder appear unremarkable. Stomach/Bowel: There is sigmoid diverticulosis and scattered colonic diverticula.  There is postsurgical changes of gastric bypass. There is a small hiatal hernia. There is no bowel obstruction. Vascular/Lymphatic: Moderate aortoiliac atherosclerotic disease. The IVC is unremarkable. No portal venous gas. There is no adenopathy. Reproductive: The prostate and seminal vesicles are grossly unremarkable. Other: There is diffuse subcutaneous edema and anasarca. Musculoskeletal: Osteopenia with degenerative changes. No acute osseous pathology. IMPRESSION: 1. Status post cholecystectomy with a 5.5 x 10.5 cm abscess. Percutaneous drainage catheter with tip in the abscess. 2. Percutaneous transhepatic internal/external drain with tip in the region of the second portion of the duodenum. 3. Small ascites new or significantly progressed since the prior CT. 4. Partially visualized bilateral pleural effusions, right greater than left, new since the prior CT. 5. A 9 mm stone in the distal left ureter with mild left hydronephrosis. 6. Nonobstructing bilateral renal calculi. 7. Sigmoid diverticulosis. No bowel obstruction. 8.  Aortic Atherosclerosis (ICD10-I70.0). Electronically Signed   By: Elgie Collard M.D.   On: 08/15/2023 17:08   DG Shoulder Right Result Date: 08/14/2023 CLINICAL DATA:  Right shoulder pain for several days EXAM: RIGHT SHOULDER - 2+ VIEW COMPARISON:  None Available. FINDINGS: Internal rotation, external rotation, transscapular, and axillary views of the right shoulder are obtained on 5 images. No acute fracture, subluxation, or dislocation. Mild hypertrophic changes of the acromioclavicular joint. Soft tissues are unremarkable. Continued vascular congestion within the visualized right hemithorax. Right-sided PICC partially visualized. IMPRESSION: 1. Acromioclavicular joint osteoarthritis. 2. No acute bony abnormality. Electronically Signed   By: Sharlet Salina M.D.   On: 08/14/2023 17:25   DG CHEST PORT 1 VIEW Result Date: 08/12/2023 CLINICAL DATA:  Dyspnea. EXAM: PORTABLE CHEST 1  VIEW COMPARISON:  08/03/2023 FINDINGS: Right-sided PICC line unchanged. Lungs are hypoinflated with mild prominence of the perihilar vasculature suggesting a mild degree of vascular congestion. No lobar consolidation or effusion. Cardiomediastinal silhouette and remainder the exam is unchanged. IMPRESSION: Hypoinflation with mild vascular congestion. Electronically Signed   By: Elberta Fortis M.D.   On: 08/12/2023 12:47   IR US ABSCESS DRAIN PLACEMENT Result Date: 08/10/2023 INDICATION: 73 year old male with a history of biloma and bile leak after prior partial cholecystectomy. He presents for drainage and internal/external biliary drain. EXAM: ULTRASOUND-GUIDED  DRAINAGE OF SUBHEPATIC ABSCESS IMAGE GUIDED PERCUTANEOUS TRANSHEPATIC CHOLANGIOGRAM WITH INTERNAL/EXTERNAL BILIARY DRAIN. MEDICATIONS: None ANESTHESIA/SEDATION: Moderate (conscious) sedation was employed during this procedure. A total of Versed 2.0 mg and Fentanyl 100 mcg, 1 mg Dilaudid, was administered intravenously by the radiology nurse. Total intra-service moderate Sedation Time: 82 minutes. The patient's level of consciousness and vital signs were monitored continuously by radiology nursing throughout the procedure under my direct supervision. FLUOROSCOPY: Radiation Exposure Index (as provided by the fluoroscopic device): 387 mGy Kerma COMPLICATIONS: None PROCEDURE: The procedure, risks, benefits, and alternatives were explained to the patient and the patient's family. A complete informed consent was performed, with risk benefit analysis. Specific risks that were discussed for the procedure include bleeding, infection, biliary sepsis, IC use day, organ injury, need for further procedure, need for further surgery, long-term drain placement, cardiopulmonary collapse, death. Questions regarding the procedure were encouraged and answered. The patient understands and consents to the procedure. Patient is position in supine position on the fluoroscopy  table, and the upper abdomen was prepped and draped in the usual sterile fashion. Maximum barrier sterile technique with sterile gowns and gloves were used for the procedure. A timeout was performed prior to the initiation of the procedure. Local anesthesia was provided with 1% lidocaine with epinephrine. Ultrasound survey was performed of the hilum of the liver and the subhepatic liver in the right upper quadrant. Significant gas was present in the hilum of the liver, and differentiating what may be colon versus is a gas filled collection in this area was not possible. We elected to drain the sub a [place] collection as this appears to communicate on the preoperative MRI. 1% lidocaine was used for local anesthesia. A trocar needle was advanced into the subhepatic fluid collection with ultrasound guidance. Modified Seldinger technique was used to place a 10 French pigtail catheter drain. Immediate return of similar fluid to the surgical drain. We then proceeded with PTC. Ultrasound survey of the left liver lobe was performed, with then ultrasound of the right liver lobe. 1% lidocaine was used for local anesthesia, with generous infiltration of the skin and subcutaneous tissues in and inter left costal location. A Chiba needle was advanced under ultrasound guidance into the right liver lobe, using ultrasound guidance in attempt to access a biliary duct. We were successful in accessing portal vein, hepatic vein, and at 1 point distal hepatic artery. Given the small size of the biliary system/decompressed biliary system ultrasound was abandoned. We then proceeded with a formal PTC attempt. The access needle was advanced from a mid axillary line, eleventh rib into the a Paddock parenchyma. A traditional pullback contrast injection technique was used. It took 3 passes of the needle before distal biliary radicles were opacified. Intermittent opacification of the biliary system using this first needle position was possible  in opacifying the more central biliary ducts. A second access was then used for access into the biliary system, from intercostal location. This second needle was placed into the more central biliary ducts using fluoroscopic guidance with multiple obliquity. Once the tip of this needle was confirmed within the more central biliary system, an 018 wire was advanced centrally. The needle was removed, a small incision was made with an 11 blade scalpel, and then a triaxial Accustick system was advanced into the biliary system. The metal stiffener and dilator were removed, we confirmed placement with contrast infusion. A coaxial Glidewire and 4 French glide cath were then used to navigate across the obstruction at the ampulla into  the duodenum. Once the catheter was within the duodenum, the wire was removed and contrast confirmed location. A coon's wire was advanced through the system, and the Accustick and Glidewire were removed. Dilation of the subcutaneous tissue tracks was performed with a 10 [place] dilator, then a 12 [place] dilator, and then a 25 [place] biliary drain was placed as an internal/external biliary drain. Small amount of contrast confirmed location. The patient tolerated the procedure well and remained hemodynamically stable throughout. No complications were encountered and no significant blood loss was encountered. IMPRESSION: Status post ultrasound-guided subhepatic abscess drainage, as well as image guided percutaneous transhepatic cholangiogram with internal/external drainage. Signed, Yvone Neu. Miachel Roux, RPVI [facility] Electronically Signed   By: Gilmer Mor D.O.   On: 08/10/2023 15:41   IR BILIARY DRAIN PLACEMENT WITH CHOLANGIOGRAM Result Date: 08/10/2023 INDICATION: 73 year old male with a history of biloma and bile leak after prior partial cholecystectomy. He presents for drainage and internal/external biliary drain. EXAM:  ULTRASOUND-GUIDED DRAINAGE OF SUBHEPATIC ABSCESS IMAGE GUIDED PERCUTANEOUS TRANSHEPATIC CHOLANGIOGRAM WITH INTERNAL/EXTERNAL BILIARY DRAIN. MEDICATIONS: None ANESTHESIA/SEDATION: Moderate (conscious) sedation was employed during this procedure. A total of Versed 2.0 mg and Fentanyl 100 mcg, 1 mg Dilaudid, was administered intravenously by the radiology nurse. Total intra-service moderate Sedation Time: 82 minutes. The patient's level of consciousness and vital signs were monitored continuously by radiology nursing throughout the procedure under my direct supervision. FLUOROSCOPY: Radiation Exposure Index (as provided by the fluoroscopic device): 387 mGy Kerma COMPLICATIONS: None PROCEDURE: The procedure, risks, benefits, and alternatives were explained to the patient and the patient's family. A complete informed consent was performed, with risk benefit analysis. Specific risks that were discussed for the procedure include bleeding, infection, biliary sepsis, IC use day, organ injury, need for further procedure, need for further surgery, long-term drain placement, cardiopulmonary collapse, death. Questions regarding the procedure were encouraged and answered. The patient understands and consents to the procedure. Patient is position in supine position on the fluoroscopy table, and the upper abdomen was prepped and draped in the usual sterile fashion. Maximum barrier sterile technique with sterile gowns and gloves were used for the procedure. A timeout was performed prior to the initiation of the procedure. Local anesthesia was provided with 1% lidocaine with epinephrine. Ultrasound survey was performed of the hilum of the liver and the subhepatic liver in the right upper quadrant. Significant gas was present in the hilum of the liver, and differentiating what may be colon versus is a gas filled collection in this area was not possible. We elected to drain the sub a Paddock collection as this appears to communicate on  the preoperative MRI. 1% lidocaine was used for local anesthesia. A trocar needle was advanced into the subhepatic fluid collection with ultrasound guidance. Modified Seldinger technique was used to place a 10 French pigtail catheter drain. Immediate return of similar fluid to the surgical drain. We then proceeded with PTC. Ultrasound survey of the left liver lobe was performed, with then ultrasound of the right liver lobe. 1% lidocaine was used for local anesthesia, with generous infiltration of the skin and subcutaneous tissues in and inter left costal location. A Chiba needle was advanced under ultrasound guidance into the right liver lobe, using ultrasound guidance in attempt to access a biliary duct. We were successful in accessing portal vein, hepatic vein, and at 1 point distal hepatic artery. Given the small size of the biliary system/decompressed biliary system ultrasound was abandoned. We then proceeded  with a formal PTC attempt. The access needle was advanced from a mid axillary line, eleventh rib into the a Paddock parenchyma. A traditional pullback contrast injection technique was used. It took 3 passes of the needle before distal biliary radicles were opacified. Intermittent opacification of the biliary system using this first needle position was possible in opacifying the more central biliary ducts. A second access was then used for access into the biliary system, from intercostal location. This second needle was placed into the more central biliary ducts using fluoroscopic guidance with multiple obliquity. Once the tip of this needle was confirmed within the more central biliary system, an 018 wire was advanced centrally. The needle was removed, a small incision was made with an 11 blade scalpel, and then a triaxial Accustick system was advanced into the biliary system. The metal stiffener and dilator were removed, we confirmed placement with contrast infusion. A coaxial Glidewire and 4 French glide  cath were then used to navigate across the obstruction at the ampulla into the duodenum. Once the catheter was within the duodenum, the wire was removed and contrast confirmed location. A coon's wire was advanced through the system, and the Accustick and Glidewire were removed. Dilation of the subcutaneous tissue tracks was performed with a 10 [place] dilator, then a 12 [place] dilator, and then a 68 [place] biliary drain was placed as an internal/external biliary drain. Small amount of contrast confirmed location. The patient tolerated the procedure well and remained hemodynamically stable throughout. No complications were encountered and no significant blood loss was encountered. IMPRESSION: Status post ultrasound-guided subhepatic abscess drainage, as well as image guided percutaneous transhepatic cholangiogram with internal/external drainage. Signed, Yvone Neu. Miachel Roux, RPVI [facility] Electronically Signed   By: Gilmer Mor D.O.   On: 08/10/2023 15:41   MR ABDOMEN MRCP W WO CONTAST Result Date: 08/08/2023 CLINICAL DATA:  Bile leak EXAM: MRI ABDOMEN WITHOUT AND WITH CONTRAST (INCLUDING MRCP) TECHNIQUE: Multiplanar multisequence MR imaging of the abdomen was performed both before and after the administration of intravenous contrast. Heavily T2-weighted images of the biliary and pancreatic ducts were obtained, and three-dimensional MRCP images were rendered by post processing. CONTRAST:  10mL GADAVIST GADOBUTROL 1 MMOL/ML IV SOLN COMPARISON:  CT abdomen/pelvis dated 07/29/2023. FINDINGS: Motion degraded images. Lower chest: Small bilateral pleural effusions. Hepatobiliary: Liver is within normal limits. Status post subtotal cholecystectomy with residual gallbladder lumen and soft tissue debris in the surgical bed. Associated 9.2 x 6.1 x 10.8 cm fluid collection extending from the gallbladder fossa (series 6/image 31) to the inferior right  perihepatic space (series 2/image 29). No intrahepatic or extrahepatic ductal dilatation. Common duct measures 5 mm, without discontinuity on MR. No choledocholithiasis is seen. Pancreas:  Within normal limits. Spleen:  Within normal limits. Adrenals/Urinary Tract:  Adrenal glands are within normal limits. Bilateral simple renal cysts, measuring up to 4.7 cm in the anterior left lower kidney (series 3/image 32), benign (Bosniak I). No follow-up is recommended. No hydronephrosis. Stomach/Bowel: Stomach is within normal limits. Visualized bowel is grossly unremarkable. Vascular/Lymphatic:  No evidence of aortic aneurysm. No suspicious abdominal lymphadenopathy. Other:  No abdominal ascites. Musculoskeletal: No focal osseous lesions. IMPRESSION: Status post subtotal cholecystectomy with residual gallbladder lumen and soft tissue debris in the surgical bed. Associated 10.8 cm fluid collection extending from the gallbladder fossa to the inferior right perihepatic space. No intrahepatic or extrahepatic ductal dilatation. Common duct measures 5 mm, without discontinuity on MR. No choledocholithiasis is seen.  Small bilateral pleural effusions. Electronically Signed   By: Charline Bills M.D.   On: 08/08/2023 03:10   MR 3D Recon At Scanner Result Date: 08/08/2023 CLINICAL DATA:  Bile leak EXAM: MRI ABDOMEN WITHOUT AND WITH CONTRAST (INCLUDING MRCP) TECHNIQUE: Multiplanar multisequence MR imaging of the abdomen was performed both before and after the administration of intravenous contrast. Heavily T2-weighted images of the biliary and pancreatic ducts were obtained, and three-dimensional MRCP images were rendered by post processing. CONTRAST:  10mL GADAVIST GADOBUTROL 1 MMOL/ML IV SOLN COMPARISON:  CT abdomen/pelvis dated 07/29/2023. FINDINGS: Motion degraded images. Lower chest: Small bilateral pleural effusions. Hepatobiliary: Liver is within normal limits. Status post subtotal cholecystectomy with residual gallbladder  lumen and soft tissue debris in the surgical bed. Associated 9.2 x 6.1 x 10.8 cm fluid collection extending from the gallbladder fossa (series 6/image 31) to the inferior right perihepatic space (series 2/image 29). No intrahepatic or extrahepatic ductal dilatation. Common duct measures 5 mm, without discontinuity on MR. No choledocholithiasis is seen. Pancreas:  Within normal limits. Spleen:  Within normal limits. Adrenals/Urinary Tract:  Adrenal glands are within normal limits. Bilateral simple renal cysts, measuring up to 4.7 cm in the anterior left lower kidney (series 3/image 32), benign (Bosniak I). No follow-up is recommended. No hydronephrosis. Stomach/Bowel: Stomach is within normal limits. Visualized bowel is grossly unremarkable. Vascular/Lymphatic:  No evidence of aortic aneurysm. No suspicious abdominal lymphadenopathy. Other:  No abdominal ascites. Musculoskeletal: No focal osseous lesions. IMPRESSION: Status post subtotal cholecystectomy with residual gallbladder lumen and soft tissue debris in the surgical bed. Associated 10.8 cm fluid collection extending from the gallbladder fossa to the inferior right perihepatic space. No intrahepatic or extrahepatic ductal dilatation. Common duct measures 5 mm, without discontinuity on MR. No choledocholithiasis is seen. Small bilateral pleural effusions. Electronically Signed   By: Charline Bills M.D.   On: 08/08/2023 03:10   DG CHEST PORT 1 VIEW Result Date: 08/03/2023 CLINICAL DATA:  PICC in place. EXAM: PORTABLE CHEST 1 VIEW COMPARISON:  3 hours prior FINDINGS: Tip of the right upper extremity PICC in the region of the atrial caval junction. Lower lung volumes from earlier today. Stable mediastinal contours, aortic atherosclerosis. Increasing atelectasis in the left lung base and right infrahilar region. No pneumothorax or large pleural effusion. IMPRESSION: 1. Tip of the right upper extremity PICC in the region of the atrial caval junction. 2. Lower  lung volumes with increasing atelectasis in the left lung base and right infrahilar region. Electronically Signed   By: Narda Rutherford M.D.   On: 08/03/2023 20:46   DG Chest Port 1 View Result Date: 08/03/2023 CLINICAL DATA:  Line placement EXAM: PORTABLE CHEST 1 VIEW COMPARISON:  X-ray 07/28/2023.  CT 07/28/2023. FINDINGS: Placement of a right-sided PICC with the tip along the upper right atrium. Stable cardiopericardial silhouette when adjusting for level of underinflation. Calcified aorta. Increasing bandlike changes right perihilar in the left lung base, favoring atelectasis. No pneumothorax, effusion or edema. Overlapping cardiac leads. The left inferior costophrenic angle is clipped off the edge of the film. Degenerative changes along the spine. IMPRESSION: Right-sided PICC in place with tip along the upper right atrium. Decreased inflation with some increasing atelectasis. Electronically Signed   By: Karen Kays M.D.   On: 08/03/2023 18:04   [place] EKG SITE RITE Result Date: 08/03/2023 If Site Rite image not attached, placement could not be confirmed due to current cardiac rhythm.  LONG TERM MONITOR (3-14 DAYS) Result Date: 08/01/2023   Atrial Fibrillation  occurred continuously (100% burden), ranging from 53-193 bpm (avg of 94 bpm). Patch Wear Time:  13 days and 13 hours (2024-12-17T13:14:02-499 to 2024-12-31T02:25:18-0500) 2 Ventricular Tachycardia runs occurred, the run with the fastest interval lasting 4 beats with a max rate of 164 bpm, the longest lasting 11.4 secs with an avg rate of 132 bpm. Atrial Fibrillation occurred continuously (100% burden), ranging from 53-193  bpm (avg of 94 bpm). Isolated VEs were rare (<1.0%, 2515), VE Couplets were rare (<1.0%, 67), and VE Triplets were rare (<1.0%, 5). Ventricular Bigeminy was present.   ECHOCARDIOGRAM COMPLETE Result Date: 07/31/2023    ECHOCARDIOGRAM REPORT   Patient Name:   Francisco Padilla Date of Exam: 07/31/2023 Medical Rec #:  [id number]      Height:       76.0 in Accession #:    [id number]    Weight:       205.0 lb Date of Birth:  07/17/1951     BSA:          2.239 m Patient Age:    72 years      BP:           96/74 mmHg Patient Gender: M             HR:           100 bpm. Exam Location:  Inpatient Procedure: 2D Echo, Cardiac Doppler, Color Doppler and Intracardiac            Opacification Agent Indications:    Preoperative Evaluation  History:        Patient has prior history of Echocardiogram examinations, most                 recent 09/11/2014. CAD, Aortic Valve Disease, Arrythmias:Atrial                 Fibrillation; Signs/Symptoms:Hypotension.  Sonographer:    Webb Laws Referring Phys: [id number] SHENG L HALEY  Sonographer Comments: Suboptimal parasternal window and suboptimal apical window. IMPRESSIONS  1. The aortic valve is severely calcified in views obtained. Vmax 2.9 m/s, MG 20 mmHG, AVA 0.96 cm2, DI 0.21. LV SVI low 22 cc/m2. 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 If there are clinical concerns for severe aortic stenosis, would recommend an aortic valve calcium score for clarification. The aortic valve is calcified. Aortic valve regurgitation is not visualized. Moderate aortic valve stenosis. Aortic valve area, by VTI measures 0.96 cm. Aortic valve mean gradient measures 20.3 mmHg. Aortic valve Vmax measures 2.93 m/s.  2. Left ventricular ejection fraction, by estimation, is 60 to 65%. The left ventricle has normal function. The left ventricle has no regional wall motion abnormalities. Indeterminate diastolic filling due to E-A fusion.  3. Right ventricular systolic function is mildly reduced. The right ventricular size is mildly enlarged. There is normal pulmonary artery systolic pressure. The estimated right ventricular systolic pressure is 30.2 mmHg.  4. Left atrial size was moderately dilated.  5. Right atrial size was mildly dilated.  6. The mitral valve is grossly normal. Trivial  mitral valve regurgitation. No evidence of mitral stenosis.  7. The inferior vena cava is normal in size with greater than 50% respiratory variability, suggesting right atrial pressure of 3 mmHg. FINDINGS  Left Ventricle: Left ventricular ejection fraction, by estimation, is 60 to 65%. The left ventricle has normal function. The left ventricle has no regional wall motion abnormalities. Definity contrast agent was given IV to delineate the left  ventricular  endocardial borders. The left ventricular internal cavity size was normal in size. There is no left ventricular hypertrophy. Indeterminate diastolic filling due to E-A fusion. Right Ventricle: The right ventricular size is mildly enlarged. No increase in right ventricular wall thickness. Right ventricular systolic function is mildly reduced. There is normal pulmonary artery systolic pressure. The tricuspid regurgitant velocity  is 1.95 m/s, and with an assumed right atrial pressure of 15 mmHg, the estimated right ventricular systolic pressure is 30.2 mmHg. Left Atrium: Left atrial size was moderately dilated. Right Atrium: Right atrial size was mildly dilated. Pericardium: There is no evidence of pericardial effusion. Mitral Valve: The mitral valve is grossly normal. Trivial mitral valve regurgitation. No evidence of mitral valve stenosis. Tricuspid Valve: The tricuspid valve is grossly normal. Tricuspid valve regurgitation is trivial. No evidence of tricuspid stenosis. Aortic Valve: The aortic valve is severely calcified in views obtained. Vmax 2.9 m/s, MG 20 mmHG, AVA 0.96 cm2, DI 0.21. LV SVI low 22 cc/m2. 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 If there are clinical concerns for severe aortic stenosis, would recommend an aortic valve calcium score for clarification. The aortic valve is calcified. Aortic valve regurgitation is not visualized. Moderate aortic stenosis is present. Aortic valve mean gradient measures  20.3 mmHg. Aortic valve peak gradient measures 34.3 mmHg. Aortic valve area, by VTI measures 0.96 cm. Pulmonic Valve: The pulmonic valve was grossly normal. Pulmonic valve regurgitation is not visualized. No evidence of pulmonic stenosis. Aorta: The aortic root and ascending aorta are structurally normal, with no evidence of dilitation. Venous: The inferior vena cava is normal in size with greater than 50% respiratory variability, suggesting right atrial pressure of 3 mmHg. IAS/Shunts: The atrial septum is grossly normal.  LEFT VENTRICLE PLAX 2D LVIDd:         4.70 cm      Diastology LVIDs:         3.30 cm      LV e' medial:    10.40 cm/s LV PW:         1.00 cm      LV E/e' medial:  5.8 LV IVS:        0.90 cm      LV e' lateral:   14.60 cm/s LVOT diam:     2.40 cm      LV E/e' lateral: 4.1 LV SV:         50 LV SV Index:   22 LVOT Area:     4.52 cm  LV Volumes (MOD) LV vol d, MOD A2C: 61.7 ml LV vol d, MOD A4C: 113.0 ml LV vol s, MOD A2C: 29.6 ml LV vol s, MOD A4C: 45.4 ml LV SV MOD A2C:     32.1 ml LV SV MOD A4C:     113.0 ml LV SV MOD BP:      51.5 ml RIGHT VENTRICLE            IVC RV Basal diam:  4.20 cm    IVC diam: 3.20 cm RV Mid diam:    3.40 cm RV S prime:     9.48 cm/s TAPSE (M-mode): 1.7 cm LEFT ATRIUM            Index        RIGHT ATRIUM           Index LA diam:      5.60 cm  2.50 cm/m   RA Area:  17.50 cm LA Vol (A4C): 115.0 ml 51.37 ml/m  RA Volume:   41.50 ml  18.54 ml/m  AORTIC VALVE AV Area (Vmax):    0.99 cm AV Area (Vmean):   0.98 cm AV Area (VTI):     0.96 cm AV Vmax:           292.98 cm/s AV Vmean:          200.557 cm/s AV VTI:            0.526 m AV Peak Grad:      34.3 mmHg AV Mean Grad:      20.3 mmHg LVOT Vmax:         64.30 cm/s LVOT Vmean:        43.400 cm/s LVOT VTI:          0.111 m LVOT/AV VTI ratio: 0.21  AORTA Ao Asc diam: 3.50 cm MITRAL VALVE               TRICUSPID VALVE MV Area (PHT): 6.32 cm    TR Peak grad:   15.2 mmHg MV Decel Time: 120 msec    TR Vmax:        195.00  cm/s MV E velocity: 60.20 cm/s MV A velocity: 40.30 cm/s  SHUNTS MV E/A ratio:  1.49        Systemic VTI:  0.11 m                            Systemic Diam: 2.40 cm Lennie Odor MD Electronically signed by Lennie Odor MD Signature Date/Time: 07/31/2023/10:21:24 AM    Final    CT Chest W Contrast Result Date: 07/29/2023 CLINICAL DATA:  Sepsis, weakness, fatigue. EXAM: CT CHEST WITH CONTRAST TECHNIQUE: Multidetector CT imaging of the chest was performed during intravenous contrast administration. RADIATION DOSE REDUCTION: This exam was performed according to the departmental dose-optimization program which includes automated exposure control, adjustment of the mA and/or kV according to patient size and/or use of iterative reconstruction technique. CONTRAST:  75mL OMNIPAQUE IOHEXOL 350 MG/ML SOLN COMPARISON:  01/20/2011 FINDINGS: Cardiovascular: Heart is normal size. Aorta is normal caliber. Diffuse 3 vessel coronary artery disease. Aortic atherosclerosis. Mediastinum/Nodes: No mediastinal, hilar, or axillary adenopathy. Trachea and esophagus are unremarkable. Thyroid unremarkable. Lungs/Pleura: Lungs are clear. No focal airspace opacities or suspicious nodules. No effusions. Upper Abdomen: See abdominal CT report Musculoskeletal: Chest wall soft tissues are unremarkable. No acute bony abnormality. IMPRESSION: No acute cardiopulmonary disease. Three vessel coronary artery disease. Aortic Atherosclerosis (ICD10-I70.0). Electronically Signed   By: Charlett Nose M.D.   On: 07/29/2023 00:15   CT Head Wo Contrast Result Date: 07/29/2023 CLINICAL DATA:  Memory loss EXAM: CT HEAD WITHOUT CONTRAST TECHNIQUE: Contiguous axial images were obtained from the base of the skull through the vertex without intravenous contrast. RADIATION DOSE REDUCTION: This exam was performed according to the departmental dose-optimization program which includes automated exposure control, adjustment of the mA and/or kV according to patient size  and/or use of iterative reconstruction technique. COMPARISON:  12/07/2022 FINDINGS: Brain: There is atrophy and chronic small vessel disease changes. No acute intracranial abnormality. Specifically, no hemorrhage, hydrocephalus, mass lesion, acute infarction, or significant intracranial injury. Vascular: No hyperdense vessel or unexpected calcification. Skull: No acute calvarial abnormality. Sinuses/Orbits: No acute findings Other: None IMPRESSION: Atrophy, chronic microvascular disease. No acute intracranial abnormality. Electronically Signed   By: Charlett Nose M.D.   On: 07/29/2023 00:14   CT ABDOMEN  PELVIS W CONTRAST Result Date: 07/29/2023 CLINICAL DATA:  Weakness, fatigue.  Abdominal pain. EXAM: CT ABDOMEN AND PELVIS WITH CONTRAST TECHNIQUE: Multidetector CT imaging of the abdomen and pelvis was performed using the standard protocol following bolus administration of intravenous contrast. RADIATION DOSE REDUCTION: This exam was performed according to the departmental dose-optimization program which includes automated exposure control, adjustment of the mA and/or kV according to patient size and/or use of iterative reconstruction technique. CONTRAST:  75mL OMNIPAQUE IOHEXOL 350 MG/ML SOLN COMPARISON:  06/06/2021 FINDINGS: Lower chest: No acute abnormality. Hepatobiliary: Multiple layering gallstones within the gallbladder. Gallbladder is distended with gallbladder wall thickening. Findings concerning for acute cholecystitis. No focal hepatic abnormality. Mild periportal edema. No biliary ductal dilatation. Pancreas: Atrophy of the pancreas. No focal abnormality or ductal dilatation. Spleen: No focal abnormality.  Normal size. Adrenals/Urinary Tract: Adrenal glands normal. Numerous bilateral nonobstructing renal stones. Bilateral renal cysts are similar prior study. No follow-up imaging recommended. No definite ureteral stones. In the left pelvis posterior to the bladder in the region of the distal left ureter.  The ureter is decompressed and difficult to follows/visualized. Given the decompressed state, I favor this is likely an adjacent phlebolith. Stomach/Bowel: Prior Roux-en-Y gastric bypass. Stomach, large and small bowel grossly unremarkable. Vascular/Lymphatic: Aortoiliac atherosclerosis. No evidence of aneurysm or adenopathy. Reproductive: No visible focal abnormality. Other: No free fluid or free air. Musculoskeletal: No acute bony abnormality. IMPRESSION: Cholelithiasis. Gallbladder distension with gallbladder wall thickening and surrounding inflammation concerning for acute cholecystitis. Periportal edema can be seen with volume overload/resuscitation or inflammatory processes such as hepatitis. Bilateral nephrolithiasis.  No hydronephrosis. Prior gastric bypass.  No visible complicating feature. Aortoiliac atherosclerosis. Electronically Signed   By: Charlett Nose M.D.   On: 07/29/2023 00:13   DG Ribs Unilateral W/Chest Right Result Date: 07/28/2023 CLINICAL DATA:  Recent fall with right rib pain, initial encounter EXAM: RIGHT RIBS AND CHEST - 3+ VIEW COMPARISON:  07/31/2022 FINDINGS: Cardiac shadow is within normal limits. Aortic calcifications are noted. The lungs are clear bilaterally. No infiltrate or pneumothorax is seen. No acute rib abnormality noted. IMPRESSION: No acute rib fracture seen. Electronically Signed   By: Alcide Clever M.D.   On: 07/28/2023 23:39    Rexene Alberts, MSN, NP-C [facility] Pager: [phone]  08/21/2023 2:31 PM

## 2023-08-21 NOTE — Plan of Care (Signed)
Problem: Education: Goal: Knowledge of General Education information will improve Description: Including pain rating scale, medication(s)/side effects and non-pharmacologic comfort measures Outcome: Progressing   Problem: Health Behavior/Discharge Planning: Goal: Ability to manage health-related needs will improve Outcome: Progressing   Problem: Clinical Measurements: Goal: Ability to maintain clinical measurements within normal limits will improve Outcome: Progressing Goal: Will remain free from infection Outcome: Progressing Goal: Diagnostic test results will improve Outcome: Progressing Goal: Cardiovascular complication will be avoided Outcome: Progressing   Problem: Activity: Goal: Risk for activity intolerance will decrease Outcome: Progressing   Problem: Coping: Goal: Level of anxiety will decrease Outcome: Progressing   Problem: Pain Management: Goal: General experience of comfort will improve Outcome: Progressing   Problem: Safety: Goal: Ability to remain free from injury will improve Outcome: Progressing   Problem: Skin Integrity: Goal: Risk for impaired skin integrity will decrease Outcome: Progressing

## 2023-08-21 NOTE — Progress Notes (Signed)
20 Days Post-Op   Subjective/Chief Complaint: Patient feeling ok this AM, right sided pain but unchanged from prior to capping  Objective: Vital signs in last 24 hours: Temp:  [97.6 F (36.4 C)-98.8 F (37.1 C)] 98.2 F (36.8 C) (01/28 0714) Pulse Rate:  [67-93] 93 (01/28 0714) Resp:  [18-20] 18 (01/28 0714) BP: (87-105)/(63-73) 87/68 (01/28 0714) SpO2:  [97 %-100 %] 100 % (01/28 0714) Weight:  [91.1 kg] 91.1 kg (01/28 0446) Last BM Date : 08/20/23  Intake/Output from previous day: 01/27 0701 - 01/28 0700 In: 1435 [P.O.:1320; IV Piggyback:100] Out: 3045 [Urine:1350; Drains:1695] Intake/Output this shift: Total I/O In: 120 [P.O.:120] Out: -   Gen: NAD Heart: irregular Abd: soft, incisions c/d/I, both perc drains purulent output, PTC capped  Lab Results:  Recent Labs    08/20/23 1201 08/21/23 0505  WBC 13.7* 13.6*  HGB 9.1* 8.5*  HCT 27.7* 25.9*  PLT 311 316   BMET Recent Labs    08/20/23 1201 08/21/23 0505  NA 134* 134*  K 4.1 4.1  CL 100 101  CO2 26 27  GLUCOSE 113* 102*  BUN 19 15  CREATININE 0.66 0.57*  CALCIUM 8.5* 8.6*      Latest Ref Rng & Units 08/21/2023    5:05 AM 08/20/2023   12:01 PM 08/19/2023    4:23 AM  Hepatic Function  Total Protein 6.5 - 8.1 g/dL 5.7  5.8  5.6   Albumin 3.5 - 5.0 g/dL 1.8  1.8  1.8   AST 15 - 41 U/L 16  18  39   ALT 0 - 44 U/L 27  35  55   Alk Phosphatase 38 - 126 U/L 209  264  360   Total Bilirubin 0.0 - 1.2 mg/dL 1.0  1.1  1.4      Anti-infectives: Anti-infectives (From admission, onward)    Start     Dose/Rate Route Frequency Ordered Stop   08/15/23 1615  cefTRIAXone (ROCEPHIN) 2 g in sodium chloride 0.9 % 100 mL IVPB        2 g 200 mL/hr over 30 Minutes Intravenous To Radiology 08/15/23 1525 08/15/23 1643   08/13/23 0800  meropenem (MERREM) 1 g in sodium chloride 0.9 % 100 mL IVPB        1 g 200 mL/hr over 30 Minutes Intravenous Every 8 hours 08/13/23 0714     08/09/23 1400  cefTRIAXone (ROCEPHIN) 2 g in  sodium chloride 0.9 % 100 mL IVPB  Status:  Discontinued        2 g 200 mL/hr over 30 Minutes Intravenous To Radiology 08/09/23 1254 08/10/23 0716   08/09/23 1245  cefTRIAXone (ROCEPHIN) 2 g in sodium chloride 0.9 % 100 mL IVPB  Status:  Discontinued       Note to Pharmacy: Please do not give on floor. To be given in IR.   2 g 200 mL/hr over 30 Minutes Intravenous To Radiology 08/09/23 1152 08/09/23 1213   08/07/23 1100  piperacillin-tazobactam (ZOSYN) IVPB 3.375 g  Status:  Discontinued        3.375 g 12.5 mL/hr over 240 Minutes Intravenous Every 8 hours 08/07/23 1008 08/13/23 0714   08/07/23 0000  piperacillin-tazobactam (ZOSYN) 3.375 GM/50ML IVPB        3.375 g Intravenous Every 8 hours 08/07/23 1501     07/29/23 0800  piperacillin-tazobactam (ZOSYN) IVPB 3.375 g        3.375 g 12.5 mL/hr over 240 Minutes Intravenous Every 8 hours 07/29/23 0207  08/06/23 2359   07/29/23 0030  piperacillin-tazobactam (ZOSYN) IVPB 3.375 g        3.375 g 100 mL/hr over 30 Minutes Intravenous  Once 07/29/23 0020 07/29/23 0147       Assessment/Plan: s/p subtotal cholecystectomy for gangrenous cholecystitis, Dr. Hillery Hunter 08/01/23 -regular diet -cont abx therapy - Do not uncap PTC unless having increasing abdominal pain/nausea. If this occurs would repeat KUB for placement. Would monitor for 48h after capping since this was time frame patient last had issues with capping. - Patient is not comfortable with drain care, will need to discharge to a facility who can manage drains until they can be removed. Anticipate several more weeks -cont JP drains as they are continuing to drain this subhepatic fluid collection -Eliquis restarted -will follow up with Dr. Hillery Hunter and IR as outpatient.     FEN -regular VTE - Eliquis ID - continue meropenem   A fib AKI Peripheral neuropathy GERD HLD Hx of gastric bypass  LOS: 23 days    Lysle Rubens, MD 08/21/2023

## 2023-08-21 NOTE — Progress Notes (Signed)
PROGRESS NOTE  Francisco Padilla YQM:[id number] DOB: 1950-08-26   PCP: [name], MD  Patient is from: Home.  DOA: 07/28/2023 LOS: 23  Chief complaints Chief Complaint  Patient presents with   Weakness     Brief Narrative / Interim history: 73 year old M with PMH of OSA on CPAP, hypertension, PAF on Eliquis, PVD and bariatric surgery presented to ED on 1/5 with generalized weakness and abdominal pain and admitted with acute cholecystitis as noted on CT abdomen and pelvis.  Started on IV Zosyn.  General surgery consulted.  He underwent laparoscopic cholecystectomy on 1/8 after Eliquis washout cardiac clearance by cardiology.  He is TTE showed LVEF of 60 to 65% with severe AS.  While in OR, patient became tachycardic and started on esmolol.  Later, he became hypotensive and started on pressor and transferred to ICU.  Patient continued to have right-sided abdominal pain.  Imaging concerning for biliary leak/possible subhepatic abscess.  He had percutaneous biliary drain placed on 1/17.  Fluid culture with beta-lactamase positive E. coli.  Antibiotics changed to IV meropenem on 1/20.  PTC was capped on 1/20 but he developed right-sided abdominal pain again and PTC was uncapped on 1/22.  General surgery start recapping biliary drain again on 1/27.  Still with significant purulent output from JP drain.  He may need to remain on IV antibiotics until JP output is minimal.  ID consulted for guidance on antibiotics.  Therapy recommended SNF.  Subjective: Seen and examined earlier this morning.  No major events overnight of this morning.  Biliary drain capped yesterday.  Has been doing well so far.  No significant pain.  No nausea or vomiting.  Objective: Vitals:   08/21/23 0054 08/21/23 0446 08/21/23 0714 08/21/23 1143  BP: 99/67 105/72 (!) 87/68 (!) 85/57  Pulse:  84 93 91  Resp: 20 18 18 19   Temp: 97.9 F (36.6 C) 97.6 F (36.4 C) 98.2 F (36.8 C) 98.3 F (36.8 C)  TempSrc: Oral Oral  Oral Oral  SpO2:  97% 100% 99%  Weight:  91.1 kg    Height:        Examination:  GENERAL: No apparent distress.  Nontoxic. HEENT: MMM.  Vision and hearing grossly intact.  NECK: Supple.  No apparent JVD.  RESP:  No IWOB.  Fair aeration bilaterally. CVS:  RRR. Heart sounds normal.  ABD/GI/GU: BS+. Abd soft, NTND.  2 JP drains to right abdomen with purulent output.  Biliary drain capped.  MSK/EXT:  Moves extremities. No apparent deformity. No edema.  SKIN: no apparent skin lesion or wound NEURO: Awake, alert and oriented appropriately.  No apparent focal neuro deficit. PSYCH: Calm. Normal affect.   Procedures:  1/17: Image guided drain into subhepatic abscess, PTC 1/20: PTC capped by IR, LFTs normal 1/22: Right-sided abdominal pain, reconnected PTC, CT abdomen  Microbiology summarized: 1/5-COVID-19, influenza and RSV PCR nonreactive 1/7-MRSA PCR screen negative 1/5-blood cultures NGTD 1/17-abscess/biliary culture with beta-lactamase positive for E. coli.  Assessment and plan: Sepsis due to acute gangrenous cholecystitis: -S/p subtotal cholecystectomy by Dr. Hillery Hunter on 1/8  -MRCP on 1/15: undrained fluid collection that the surgical drain traverses but unable to completely evacuate -S/p image guided drain into the subhepatic abscess and PTC on 1/17 (Dr. Loreta Ave) -Abscess cultures on 1/17 with beta-lactamase positive for E. coli.  IV Zosyn changed to meropenem on 1/20. -PTC capped by IR on 1/20 but uncapped on 1/22 due to pain.  PTC recapped on 1/27. -Per surgery, patient can be discharged  if he tolerates PTC capping for 48 hours. -Continue JP drains for the subhepatic fluid collection.  Still with significant output. -Eliquis was resumed on 1/20 anticipating no further procedures -Follow-up with IR and general surgeon. -Consulted for guidance on antibiotics.   Chronic hypotension: On Solu-Cortef and midodrine at home.  TTE with severe aortic stenosis.  TSH, B12, folate and a.m.  cortisol normal.  Received IV albumin.  Normotensive for most part. -Continue midodrine 15 mg 3 times daily -Continue Solu-Cortef -Cardiology signed off.  Severe aortic stenosis: Noted on TTE for preoperative evaluation. -Outpatient follow-up with cardiology.   AKI- resolved   Hyponatremia-mild.  Asymptomatic   Persistent A-fib with RVR: Rate controlled. -Continue eliquis   History of Roux-en-Y gastric bypass -Follow up as outpatient.    OSA on CPAP -Continue Home Cpap.   Severe malnutrition: Body mass index is 24.45 kg/m. Nutrition Problem: Severe Malnutrition Etiology: chronic illness (gastric bypass) Signs/Symptoms: severe fat depletion, severe muscle depletion Interventions: Ensure Enlive (each supplement provides 350kcal and 20 grams of protein), MVI, Carnation Instant Breakfast  Pressure skin injury:  Pressure Injury 08/07/23 Buttocks Right Stage 2 -  Partial thickness loss of dermis presenting as a shallow open injury with a red, pink wound bed without slough. round and red (Active)  08/07/23 2000  Location: Buttocks  Location Orientation: Right  Staging: Stage 2 -  Partial thickness loss of dermis presenting as a shallow open injury with a red, pink wound bed without slough.  Wound Description (Comments): round and red  Present on Admission: No  Dressing Type Foam - Lift dressing to assess site every shift 08/20/23 2044   DVT prophylaxis:  SCDs Start: 07/29/23 0131 Place TED hose Start: 07/29/23 0131 apixaban (ELIQUIS) tablet 5 mg  Code Status: Full code Family Communication: Updated patient's advocate over patient's phone as requested by patient Level of care: Med-Surg Status is: Inpatient Remains inpatient appropriate because: Acute gangrenous cholecystitis with subhepatic abscess requiring antibiotics   Final disposition: SNF once cleared by surgery Consultants:  General surgery Interventional cardiology Cardiology Infectious disease  55 minutes  with more than 50% spent in reviewing records, counseling patient/family and coordinating care.   Sch Meds:  Scheduled Meds:  acetaminophen  1,000 mg Oral Q6H   Or   acetaminophen  650 mg Rectal Q6H   acidophilus  1 capsule Oral Daily   apixaban  5 mg Oral BID   Chlorhexidine Gluconate Cloth  6 each Topical Daily   docusate sodium  100 mg Oral BID   feeding supplement  237 mL Oral BID BM   fludrocortisone  0.1 mg Oral Daily   gabapentin  600 mg Oral QHS   latanoprost  1 drop Both Eyes QHS   midodrine  15 mg Oral TID WC   multivitamin with minerals  1 tablet Oral BID   nutrition supplement (JUVEN)  1 packet Oral BID BM   pantoprazole  40 mg Oral BID   polyethylene glycol  17 g Oral Daily   sodium chloride flush  3 mL Intravenous Q12H   sodium chloride flush  5 mL Intracatheter Q8H   [START ON 08/22/2023] tamsulosin  0.4 mg Oral QPC breakfast   venlafaxine XR  37.5 mg Oral Q breakfast   zolpidem  10 mg Oral QHS   Continuous Infusions:  meropenem (MERREM) IV 1 g (08/21/23 0507)   PRN Meds:.Gerhardt's butt cream, HYDROmorphone (DILAUDID) injection, iohexol, liver oil-zinc oxide, LORazepam, mouth rinse, oxyCODONE, simethicone  Antimicrobials: Anti-infectives (From admission, onward)  Start     Dose/Rate Route Frequency Ordered Stop   08/15/23 1615  cefTRIAXone (ROCEPHIN) 2 g in sodium chloride 0.9 % 100 mL IVPB        2 g 200 mL/hr over 30 Minutes Intravenous To Radiology 08/15/23 1525 08/15/23 1643   08/13/23 0800  meropenem (MERREM) 1 g in sodium chloride 0.9 % 100 mL IVPB        1 g 200 mL/hr over 30 Minutes Intravenous Every 8 hours 08/13/23 0714     08/09/23 1400  cefTRIAXone (ROCEPHIN) 2 g in sodium chloride 0.9 % 100 mL IVPB  Status:  Discontinued        2 g 200 mL/hr over 30 Minutes Intravenous To Radiology 08/09/23 1254 08/10/23 0716   08/09/23 1245  cefTRIAXone (ROCEPHIN) 2 g in sodium chloride 0.9 % 100 mL IVPB  Status:  Discontinued       Note to Pharmacy: Please  do not give on floor. To be given in IR.   2 g 200 mL/hr over 30 Minutes Intravenous To Radiology 08/09/23 1152 08/09/23 1213   08/07/23 1100  piperacillin-tazobactam (ZOSYN) IVPB 3.375 g  Status:  Discontinued        3.375 g 12.5 mL/hr over 240 Minutes Intravenous Every 8 hours 08/07/23 1008 08/13/23 0714   08/07/23 0000  piperacillin-tazobactam (ZOSYN) 3.375 GM/50ML IVPB        3.375 g Intravenous Every 8 hours 08/07/23 1501     07/29/23 0800  piperacillin-tazobactam (ZOSYN) IVPB 3.375 g        3.375 g 12.5 mL/hr over 240 Minutes Intravenous Every 8 hours 07/29/23 0207 08/06/23 2359   07/29/23 0030  piperacillin-tazobactam (ZOSYN) IVPB 3.375 g        3.375 g 100 mL/hr over 30 Minutes Intravenous  Once 07/29/23 0020 07/29/23 0147        I have personally reviewed the following labs and images: CBC: Recent Labs  Lab 08/16/23 0700 08/17/23 0554 08/18/23 0500 08/19/23 0423 08/20/23 1201 08/21/23 0505  WBC 19.2* 17.1* 13.2* 11.7* 13.7* 13.6*  NEUTROABS 17.1*  --   --   --   --   --   HGB 9.3* 9.3* 8.6* 8.7* 9.1* 8.5*  HCT 28.0* 28.2* 26.1* 26.4* 27.7* 25.9*  MCV 93.6 93.4 93.2 92.6 92.0 90.9  PLT 266 263 234 264 311 316   BMP &GFR Recent Labs  Lab 08/17/23 0554 08/18/23 0500 08/19/23 0423 08/20/23 1201 08/21/23 0505  NA 136 135 134* 134* 134*  K 4.2 3.9 4.2 4.1 4.1  CL 102 101 102 100 101  CO2 28 26 26 26 27   GLUCOSE 98 99 96 113* 102*  BUN 25* 21 19 19 15   CREATININE 0.56* 0.78 0.52* 0.66 0.57*  CALCIUM 8.6* 8.5* 8.6* 8.5* 8.6*   Estimated Creatinine Clearance: 104 mL/min (A) (by C-G formula based on SCr of 0.57 mg/dL (L)). Liver & Pancreas: Recent Labs  Lab 08/17/23 0554 08/18/23 0500 08/19/23 0423 08/20/23 1201 08/21/23 0505  AST 13* 109* 39 18 16  ALT 23 78* 55* 35 27  ALKPHOS 191* 472* 360* 264* 209*  BILITOT 0.7 3.3* 1.4* 1.1 1.0  PROT 4.9* 4.7* 5.6* 5.8* 5.7*  ALBUMIN 1.9* 1.7* 1.8* 1.8* 1.8*   No results for input(s): "LIPASE", "AMYLASE" in  the last 168 hours. No results for input(s): "AMMONIA" in the last 168 hours. Diabetic: No results for input(s): "HGBA1C" in the last 72 hours. No results for input(s): "GLUCAP" in the last 168 hours.  Cardiac Enzymes: No results for input(s): "CKTOTAL", "CKMB", "CKMBINDEX", "TROPONINI" in the last 168 hours. No results for input(s): "PROBNP" in the last 8760 hours. Coagulation Profile: No results for input(s): "INR", "PROTIME" in the last 168 hours. Thyroid Function Tests: No results for input(s): "TSH", "T4TOTAL", "FREET4", "T3FREE", "THYROIDAB" in the last 72 hours. Lipid Profile: No results for input(s): "CHOL", "HDL", "LDLCALC", "TRIG", "CHOLHDL", "LDLDIRECT" in the last 72 hours. Anemia Panel: No results for input(s): "VITAMINB12", "FOLATE", "FERRITIN", "TIBC", "IRON", "RETICCTPCT" in the last 72 hours. Urine analysis:    Component Value Date/Time   COLORURINE AMBER BIOCHEMICALS MAY BE AFFECTED BY COLOR (A) 10/21/2009 1928   APPEARANCEUR CLEAR 10/21/2009 1928   LABSPEC 1.024 10/21/2009 1928   PHURINE 5.5 10/21/2009 1928   GLUCOSEU NEGATIVE 10/21/2009 1928   HGBUR NEGATIVE 10/21/2009 1928   BILIRUBINUR SMALL (A) 10/21/2009 1928   KETONESUR 15 (A) 10/21/2009 1928   PROTEINUR 30 (A) 10/21/2009 1928   UROBILINOGEN 1.0 10/21/2009 1928   NITRITE NEGATIVE 10/21/2009 1928   LEUKOCYTESUR TRACE (A) 10/21/2009 1928   Sepsis Labs: Invalid input(s): "PROCALCITONIN", "LACTICIDVEN"  Microbiology: No results found for this or any previous visit (from the past 240 hours).   Radiology Studies: No results found.    Francisco Padilla T. Shelisa Fern [facility]  If 7PM-7AM, please contact night-coverage [email or web address] 08/21/2023, 2:11 PM

## 2023-08-22 DIAGNOSIS — K810 Acute cholecystitis: Secondary | ICD-10-CM | POA: Diagnosis not present

## 2023-08-22 DIAGNOSIS — I4891 Unspecified atrial fibrillation: Secondary | ICD-10-CM | POA: Diagnosis not present

## 2023-08-22 DIAGNOSIS — R6510 Systemic inflammatory response syndrome (SIRS) of non-infectious origin without acute organ dysfunction: Secondary | ICD-10-CM | POA: Diagnosis not present

## 2023-08-22 DIAGNOSIS — N179 Acute kidney failure, unspecified: Secondary | ICD-10-CM | POA: Diagnosis not present

## 2023-08-22 LAB — COMPREHENSIVE METABOLIC PANEL
ALT: 25 U/L (ref 0–44)
AST: 19 U/L (ref 15–41)
Albumin: 1.8 g/dL — ABNORMAL LOW (ref 3.5–5.0)
Alkaline Phosphatase: 209 U/L — ABNORMAL HIGH (ref 38–126)
Anion gap: 11 (ref 5–15)
BUN: 23 mg/dL (ref 8–23)
CO2: 25 mmol/L (ref 22–32)
Calcium: 8.4 mg/dL — ABNORMAL LOW (ref 8.9–10.3)
Chloride: 96 mmol/L — ABNORMAL LOW (ref 98–111)
Creatinine, Ser: 0.6 mg/dL — ABNORMAL LOW (ref 0.61–1.24)
GFR, Estimated: >60 mL/min (ref >60)
Glucose, Bld: 92 mg/dL (ref 70–99)
Potassium: 4.1 mmol/L (ref 3.5–5.1)
Sodium: 132 mmol/L — ABNORMAL LOW (ref 135–145)
Total Bilirubin: 0.8 mg/dL (ref 0.0–1.2)
Total Protein: 5.6 g/dL — ABNORMAL LOW (ref 6.5–8.1)

## 2023-08-22 LAB — CBC WITH DIFFERENTIAL/PLATELET
Abs Immature Granulocytes: 0.13 10*3/uL — ABNORMAL HIGH (ref 0.00–0.07)
Basophils Absolute: 0 10*3/uL (ref 0.0–0.1)
Basophils Relative: 0 %
Eosinophils Absolute: 0.2 10*3/uL (ref 0.0–0.5)
Eosinophils Relative: 2 %
HCT: 25.9 % — ABNORMAL LOW (ref 39.0–52.0)
Hemoglobin: 8.7 g/dL — ABNORMAL LOW (ref 13.0–17.0)
Immature Granulocytes: 1 %
Lymphocytes Relative: 8 %
Lymphs Abs: 1 10*3/uL (ref 0.7–4.0)
MCH: 30.3 pg (ref 26.0–34.0)
MCHC: 33.6 g/dL (ref 30.0–36.0)
MCV: 90.2 fL (ref 80.0–100.0)
Monocytes Absolute: 1 10*3/uL (ref 0.1–1.0)
Monocytes Relative: 8 %
Neutro Abs: 9.4 10*3/uL — ABNORMAL HIGH (ref 1.7–7.7)
Neutrophils Relative %: 81 %
Platelets: 353 10*3/uL (ref 150–400)
RBC: 2.87 MIL/uL — ABNORMAL LOW (ref 4.22–5.81)
RDW: 14 % (ref 11.5–15.5)
WBC: 11.7 10*3/uL — ABNORMAL HIGH (ref 4.0–10.5)
nRBC: 0 % (ref 0.0–0.2)

## 2023-08-22 LAB — MAGNESIUM: Magnesium: 1.9 mg/dL (ref 1.7–2.4)

## 2023-08-22 MED ORDER — FLUDROCORTISONE ACETATE 0.1 MG PO TABS
0.1000 mg | ORAL_TABLET | Freq: Two times a day (BID) | ORAL | Status: DC
  Administered 2023-08-22 – 2023-08-25 (×6): 0.1 mg via ORAL
  Filled 2023-08-22 (×7): qty 1

## 2023-08-22 NOTE — Progress Notes (Signed)
Progress Note  21 Days Post-Op  Subjective: Pt sitting up eating breakfast and tolerating well. He reported solid BM yesterday and is drinking miralax this AM. Pain is stable from what I can glean from him. He does report he feels exhausted.   Objective: Vital signs in last 24 hours: Temp:  [97.7 F (36.5 C)-99 F (37.2 C)] 97.7 F (36.5 C) (01/29 0803) Pulse Rate:  [79-97] 92 (01/29 0803) Resp:  [15-19] 15 (01/29 0803) BP: (85-97)/(57-77) 95/68 (01/29 0803) SpO2:  [97 %-100 %] 97 % (01/29 0803) Last BM Date : 08/21/23  Intake/Output from previous day: 01/28 0701 - 01/29 0700 In: 135 [P.O.:120] Out: 1130 [Urine:1050; Drains:80] Intake/Output this shift: Total I/O In: 237 [P.O.:237] Out: 450 [Urine:450]  PE: Gen: NAD Abd: soft, incisions c/d/I, both perc drains purulent output, PTC capped    Lab Results:  Recent Labs    08/21/23 0505 08/22/23 0355  WBC 13.6* 11.7*  HGB 8.5* 8.7*  HCT 25.9* 25.9*  PLT 316 353   BMET Recent Labs    08/21/23 0505 08/22/23 0355  NA 134* 132*  K 4.1 4.1  CL 101 96*  CO2 27 25  GLUCOSE 102* 92  BUN 15 23  CREATININE 0.57* 0.60*  CALCIUM 8.6* 8.4*   PT/INR No results for input(s): "LABPROT", "INR" in the last 72 hours. CMP     Component Value Date/Time   NA 132 (L) 08/22/2023 0355   K 4.1 08/22/2023 0355   CL 96 (L) 08/22/2023 0355   CO2 25 08/22/2023 0355   GLUCOSE 92 08/22/2023 0355   BUN 23 08/22/2023 0355   CREATININE 0.60 (L) 08/22/2023 0355   CREATININE 0.77 07/19/2023 1128   CALCIUM 8.4 (L) 08/22/2023 0355   PROT 5.6 (L) 08/22/2023 0355   ALBUMIN 1.8 (L) 08/22/2023 0355   AST 19 08/22/2023 0355   ALT 25 08/22/2023 0355   ALKPHOS 209 (H) 08/22/2023 0355   BILITOT 0.8 08/22/2023 0355   GFRNONAA >60 08/22/2023 0355   GFRAA  10/23/2009 0135    >60        The eGFR has been calculated using the MDRD equation. This calculation has not been validated in all clinical situations. eGFR's persistently <60  mL/min signify possible Chronic Kidney Disease.   Lipase     Component Value Date/Time   LIPASE 29 07/29/2023 0014       Studies/Results: No results found.  Anti-infectives: Anti-infectives (From admission, onward)    Start     Dose/Rate Route Frequency Ordered Stop   08/21/23 2200  cefTRIAXone (ROCEPHIN) 2 g in sodium chloride 0.9 % 100 mL IVPB        2 g 200 mL/hr over 30 Minutes Intravenous Every 24 hours 08/21/23 1551     08/21/23 2200  metroNIDAZOLE (FLAGYL) tablet 500 mg        500 mg Oral Every 12 hours 08/21/23 1551     08/15/23 1615  cefTRIAXone (ROCEPHIN) 2 g in sodium chloride 0.9 % 100 mL IVPB        2 g 200 mL/hr over 30 Minutes Intravenous To Radiology 08/15/23 1525 08/15/23 1643   08/13/23 0800  meropenem (MERREM) 1 g in sodium chloride 0.9 % 100 mL IVPB  Status:  Discontinued        1 g 200 mL/hr over 30 Minutes Intravenous Every 8 hours 08/13/23 0714 08/21/23 1551   08/09/23 1400  cefTRIAXone (ROCEPHIN) 2 g in sodium chloride 0.9 % 100 mL IVPB  Status:  Discontinued        2 g 200 mL/hr over 30 Minutes Intravenous To Radiology 08/09/23 1254 08/10/23 0716   08/09/23 1245  cefTRIAXone (ROCEPHIN) 2 g in sodium chloride 0.9 % 100 mL IVPB  Status:  Discontinued       Note to Pharmacy: Please do not give on floor. To be given in IR.   2 g 200 mL/hr over 30 Minutes Intravenous To Radiology 08/09/23 1152 08/09/23 1213   08/07/23 1100  piperacillin-tazobactam (ZOSYN) IVPB 3.375 g  Status:  Discontinued        3.375 g 12.5 mL/hr over 240 Minutes Intravenous Every 8 hours 08/07/23 1008 08/13/23 0714   08/07/23 0000  piperacillin-tazobactam (ZOSYN) 3.375 GM/50ML IVPB        3.375 g Intravenous Every 8 hours 08/07/23 1501     07/29/23 0800  piperacillin-tazobactam (ZOSYN) IVPB 3.375 g        3.375 g 12.5 mL/hr over 240 Minutes Intravenous Every 8 hours 07/29/23 0207 08/06/23 2359   07/29/23 0030  piperacillin-tazobactam (ZOSYN) IVPB 3.375 g        3.375 g 100 mL/hr  over 30 Minutes Intravenous  Once 07/29/23 0020 07/29/23 0147        Assessment/Plan  s/p subtotal cholecystectomy for gangrenous cholecystitis, Dr. Hillery Hunter 08/01/23 - regular diet, tolerating and having bowel function - cont abx therapy - per ID transitioned to rocephin/flagyl and could DC on PO cipro/flagyl - Do not uncap PTC unless having increasing abdominal pain/nausea. If this occurs would repeat KUB for placement. Would monitor for 48h after capping since this was time frame patient last had issues with capping. - Patient is not comfortable with drain care, will need to discharge to a facility who can manage drains until they can be removed. Anticipate several more weeks - cont JP drains as they are continuing to drain this subhepatic fluid collection - Eliquis restarted - will follow up with Dr. Hillery Hunter and IR as outpatient.     FEN: regular VTE: Eliquis ID: [email or web address] per ID - switched to rocephin/flagyl    - per TRH -  A fib AKI Peripheral neuropathy GERD HLD Hx of gastric bypass   LOS: 24 days    Juliet Rude, [facility] Surgery 08/22/2023, 10:11 AM Please see Amion for pager number during day hours 7:00am-4:30pm

## 2023-08-22 NOTE — Progress Notes (Signed)
PROGRESS NOTE  Francisco Padilla OZH:[id number] DOB: 1951/05/16   PCP: [name], MD  Patient is from: Home.  DOA: 07/28/2023 LOS: 24  Chief complaints Chief Complaint  Patient presents with   Weakness     Brief Narrative / Interim history: 73 year old M with PMH of OSA on CPAP, hypertension, PAF on Eliquis, PVD and bariatric surgery presented to ED on 1/5 with generalized weakness and abdominal pain and admitted with acute cholecystitis as noted on CT abdomen and pelvis.  Started on IV Zosyn.  General surgery consulted.  He underwent laparoscopic cholecystectomy on 1/8 after Eliquis washout cardiac clearance by cardiology.  He is TTE showed LVEF of 60 to 65% with severe AS.  While in OR, patient became tachycardic and started on esmolol.  Later, he became hypotensive and started on pressor and transferred to ICU.  Patient continued to have right-sided abdominal pain.  Imaging concerning for biliary leak/possible subhepatic abscess.  He had percutaneous biliary drain placed on 1/17.  Fluid culture with beta-lactamase positive E. coli.  Antibiotics changed to IV meropenem on 1/20.  PTC was capped on 1/20 but he developed right-sided abdominal pain again and PTC was uncapped on 1/22.  General surgery start recapping biliary drain again on 1/27.  Still with significant purulent output from JP drain.  ID consulted for guidance on antibiotics and gave recommendation.  Therapy recommended SNF patient is concerned about drain care if discharged to SNF.  Subjective: Seen and examined earlier this morning.  No major events overnight of this morning.  No new complaints other than some pain over right chest at the drain site.  Bowel movement yesterday.  Eating breakfast this morning.  Objective: Vitals:   08/22/23 0035 08/22/23 0339 08/22/23 0803 08/22/23 1152  BP: (!) 89/62 91/64 95/68  (!) 88/62  Pulse: 85 97 92 82  Resp: 16  15 15   Temp: 99 F (37.2 C) 98 F (36.7 C) 97.7 F (36.5 C) 98.2  F (36.8 C)  TempSrc: Oral Axillary Oral Oral  SpO2: 99% 97% 97% 100%  Weight:      Height:        Examination:  GENERAL: No apparent distress.  Nontoxic. HEENT: MMM.  Vision and hearing grossly intact.  NECK: Supple.  No apparent JVD.  RESP:  No IWOB.  Fair aeration bilaterally. CVS:  RRR. Heart sounds normal.  ABD/GI/GU: BS+. Abd soft, NTND.  2 JP drains to right abdomen with with some purulent output.  Biliary drain capped.  MSK/EXT:  Moves extremities. No apparent deformity. No edema.  SKIN: no apparent skin lesion or wound NEURO: Awake, alert and oriented appropriately.  No apparent focal neuro deficit. PSYCH: Calm. Normal affect.   Procedures:  1/17: Image guided drain into subhepatic abscess, PTC 1/20: PTC capped by IR, LFTs normal 1/22: Right-sided abdominal pain, reconnected PTC, CT abdomen  Microbiology summarized: 1/5-COVID-19, influenza and RSV PCR nonreactive 1/7-MRSA PCR screen negative 1/5-blood cultures NGTD 1/17-abscess/biliary culture with beta-lactamase positive for E. coli.  Assessment and plan: Sepsis due to acute gangrenous cholecystitis: -S/p subtotal cholecystectomy by Dr. Hillery Hunter on 1/8  -MRCP on 1/15: undrained fluid collection that the surgical drain traverses but unable to completely evacuate -S/p image guided drain into the subhepatic abscess and PTC on 1/17 (Dr. Loreta Ave) -Abscess cultures on 1/17 with beta-lactamase positive for E. coli.  IV Zosyn changed to meropenem on 1/20. -ID de-escalated antibiotics to CTX and Flagyl on 1/28 and recommended Cipro and Flagyl for 4 weeks on discharge. -PTC capped  by IR on 1/20 but uncapped on 1/22 due to pain.  PTC recapped on 1/27. -Per surgery, patient can be discharged if he tolerates PTC capping for 48 hours. -Continue JP drains for the subhepatic fluid collection.  Still with significant output. -Eliquis was resumed on 1/20 anticipating no further procedures -Follow-up with IR, ID and surgery -Therapy  recommended SNF but patient is concerned about drain care at SNF.   Chronic hypotension: On Solu-Cortef and midodrine at home.  TTE with severe aortic stenosis.  TSH, B12, folate and a.m. cortisol normal.  Received IV albumin.  Normotensive for most part. -Continue midodrine 15 mg 3 times daily -Increase Florinef to twice daily -Cardiology signed off.  Severe aortic stenosis: Noted on TTE for preoperative evaluation. -Outpatient follow-up with cardiology.   AKI- resolved   Hyponatremia-mild.  Asymptomatic -Increased Florinef.   Persistent A-fib with RVR: Rate controlled. -Continue eliquis   History of Roux-en-Y gastric bypass -Follow up as outpatient.    OSA on CPAP -Continue Home Cpap.   Severe malnutrition: Body mass index is 24.45 kg/m. Nutrition Problem: Severe Malnutrition Etiology: chronic illness (gastric bypass) Signs/Symptoms: severe fat depletion, severe muscle depletion Interventions: Ensure Enlive (each supplement provides 350kcal and 20 grams of protein), MVI, Carnation Instant Breakfast  Pressure skin injury:  Pressure Injury 08/07/23 Buttocks Right Stage 2 -  Partial thickness loss of dermis presenting as a shallow open injury with a red, pink wound bed without slough. round and red (Active)  08/07/23 2000  Location: Buttocks  Location Orientation: Right  Staging: Stage 2 -  Partial thickness loss of dermis presenting as a shallow open injury with a red, pink wound bed without slough.  Wound Description (Comments): round and red  Present on Admission: No  Dressing Type Foam - Lift dressing to assess site every shift 08/22/23 0800   DVT prophylaxis:  SCDs Start: 07/29/23 0131 Place TED hose Start: 07/29/23 0131 apixaban (ELIQUIS) tablet 5 mg  Code Status: Full code Family Communication: Updated patient's advocate over patient's phone as requested by patient Level of care: Med-Surg Status is: Inpatient Remains inpatient appropriate because: Acute  gangrenous cholecystitis with subhepatic abscess requiring antibiotics   Final disposition: SNF once cleared by surgery Consultants:  General surgery Interventional cardiology Cardiology Infectious disease  55 minutes with more than 50% spent in reviewing records, counseling patient/family and coordinating care.   Sch Meds:  Scheduled Meds:  acetaminophen  1,000 mg Oral Q6H   Or   acetaminophen  650 mg Rectal Q6H   acidophilus  1 capsule Oral Daily   apixaban  5 mg Oral BID   Chlorhexidine Gluconate Cloth  6 each Topical Daily   docusate sodium  100 mg Oral BID   feeding supplement  237 mL Oral BID BM   fludrocortisone  0.1 mg Oral Daily   gabapentin  600 mg Oral QHS   latanoprost  1 drop Both Eyes QHS   metroNIDAZOLE  500 mg Oral Q12H   midodrine  15 mg Oral TID WC   multivitamin with minerals  1 tablet Oral BID   nutrition supplement (JUVEN)  1 packet Oral BID BM   pantoprazole  40 mg Oral BID   polyethylene glycol  17 g Oral Daily   sodium chloride flush  3 mL Intravenous Q12H   sodium chloride flush  5 mL Intracatheter Q8H   tamsulosin  0.4 mg Oral QPC breakfast   venlafaxine XR  37.5 mg Oral Q breakfast   zolpidem  10 mg  Oral QHS   Continuous Infusions:  cefTRIAXone (ROCEPHIN)  IV 2 g (08/21/23 2219)   PRN Meds:.amphetamine-dextroamphetamine, Gerhardt's butt cream, HYDROmorphone (DILAUDID) injection, iohexol, liver oil-zinc oxide, LORazepam, mouth rinse, oxyCODONE, simethicone  Antimicrobials: Anti-infectives (From admission, onward)    Start     Dose/Rate Route Frequency Ordered Stop   08/21/23 2200  cefTRIAXone (ROCEPHIN) 2 g in sodium chloride 0.9 % 100 mL IVPB        2 g 200 mL/hr over 30 Minutes Intravenous Every 24 hours 08/21/23 1551     08/21/23 2200  metroNIDAZOLE (FLAGYL) tablet 500 mg        500 mg Oral Every 12 hours 08/21/23 1551     08/15/23 1615  cefTRIAXone (ROCEPHIN) 2 g in sodium chloride 0.9 % 100 mL IVPB        2 g 200 mL/hr over 30  Minutes Intravenous To Radiology 08/15/23 1525 08/15/23 1643   08/13/23 0800  meropenem (MERREM) 1 g in sodium chloride 0.9 % 100 mL IVPB  Status:  Discontinued        1 g 200 mL/hr over 30 Minutes Intravenous Every 8 hours 08/13/23 0714 08/21/23 1551   08/09/23 1400  cefTRIAXone (ROCEPHIN) 2 g in sodium chloride 0.9 % 100 mL IVPB  Status:  Discontinued        2 g 200 mL/hr over 30 Minutes Intravenous To Radiology 08/09/23 1254 08/10/23 0716   08/09/23 1245  cefTRIAXone (ROCEPHIN) 2 g in sodium chloride 0.9 % 100 mL IVPB  Status:  Discontinued       Note to Pharmacy: Please do not give on floor. To be given in IR.   2 g 200 mL/hr over 30 Minutes Intravenous To Radiology 08/09/23 1152 08/09/23 1213   08/07/23 1100  piperacillin-tazobactam (ZOSYN) IVPB 3.375 g  Status:  Discontinued        3.375 g 12.5 mL/hr over 240 Minutes Intravenous Every 8 hours 08/07/23 1008 08/13/23 0714   08/07/23 0000  piperacillin-tazobactam (ZOSYN) 3.375 GM/50ML IVPB        3.375 g Intravenous Every 8 hours 08/07/23 1501     07/29/23 0800  piperacillin-tazobactam (ZOSYN) IVPB 3.375 g        3.375 g 12.5 mL/hr over 240 Minutes Intravenous Every 8 hours 07/29/23 0207 08/06/23 2359   07/29/23 0030  piperacillin-tazobactam (ZOSYN) IVPB 3.375 g        3.375 g 100 mL/hr over 30 Minutes Intravenous  Once 07/29/23 0020 07/29/23 0147        I have personally reviewed the following labs and images: CBC: Recent Labs  Lab 08/16/23 0700 08/17/23 0554 08/18/23 0500 08/19/23 0423 08/20/23 1201 08/21/23 0505 08/22/23 0355  WBC 19.2*   < > 13.2* 11.7* 13.7* 13.6* 11.7*  NEUTROABS 17.1*  --   --   --   --   --  9.4*  HGB 9.3*   < > 8.6* 8.7* 9.1* 8.5* 8.7*  HCT 28.0*   < > 26.1* 26.4* 27.7* 25.9* 25.9*  MCV 93.6   < > 93.2 92.6 92.0 90.9 90.2  PLT 266   < > 234 264 311 316 353   < > = values in this interval not displayed.   BMP &GFR Recent Labs  Lab 08/18/23 0500 08/19/23 0423 08/20/23 1201 08/21/23 0505  08/22/23 0355  NA 135 134* 134* 134* 132*  K 3.9 4.2 4.1 4.1 4.1  CL 101 102 100 101 96*  CO2 26 26 26 27 25   GLUCOSE  99 96 113* 102* 92  BUN 21 19 19 15 23   CREATININE 0.78 0.52* 0.66 0.57* 0.60*  CALCIUM 8.5* 8.6* 8.5* 8.6* 8.4*  MG  --   --   --   --  1.9   Estimated Creatinine Clearance: 104 mL/min (A) (by C-G formula based on SCr of 0.6 mg/dL (L)). Liver & Pancreas: Recent Labs  Lab 08/18/23 0500 08/19/23 0423 08/20/23 1201 08/21/23 0505 08/22/23 0355  AST 109* 39 18 16 19   ALT 78* 55* 35 27 25  ALKPHOS 472* 360* 264* 209* 209*  BILITOT 3.3* 1.4* 1.1 1.0 0.8  PROT 4.7* 5.6* 5.8* 5.7* 5.6*  ALBUMIN 1.7* 1.8* 1.8* 1.8* 1.8*   No results for input(s): "LIPASE", "AMYLASE" in the last 168 hours. No results for input(s): "AMMONIA" in the last 168 hours. Diabetic: No results for input(s): "HGBA1C" in the last 72 hours. No results for input(s): "GLUCAP" in the last 168 hours. Cardiac Enzymes: No results for input(s): "CKTOTAL", "CKMB", "CKMBINDEX", "TROPONINI" in the last 168 hours. No results for input(s): "PROBNP" in the last 8760 hours. Coagulation Profile: No results for input(s): "INR", "PROTIME" in the last 168 hours. Thyroid Function Tests: No results for input(s): "TSH", "T4TOTAL", "FREET4", "T3FREE", "THYROIDAB" in the last 72 hours. Lipid Profile: No results for input(s): "CHOL", "HDL", "LDLCALC", "TRIG", "CHOLHDL", "LDLDIRECT" in the last 72 hours. Anemia Panel: No results for input(s): "VITAMINB12", "FOLATE", "FERRITIN", "TIBC", "IRON", "RETICCTPCT" in the last 72 hours. Urine analysis:    Component Value Date/Time   COLORURINE AMBER BIOCHEMICALS MAY BE AFFECTED BY COLOR (A) 10/21/2009 1928   APPEARANCEUR CLEAR 10/21/2009 1928   LABSPEC 1.024 10/21/2009 1928   PHURINE 5.5 10/21/2009 1928   GLUCOSEU NEGATIVE 10/21/2009 1928   HGBUR NEGATIVE 10/21/2009 1928   BILIRUBINUR SMALL (A) 10/21/2009 1928   KETONESUR 15 (A) 10/21/2009 1928   PROTEINUR 30 (A)  10/21/2009 1928   UROBILINOGEN 1.0 10/21/2009 1928   NITRITE NEGATIVE 10/21/2009 1928   LEUKOCYTESUR TRACE (A) 10/21/2009 1928   Sepsis Labs: Invalid input(s): "PROCALCITONIN", "LACTICIDVEN"  Microbiology: No results found for this or any previous visit (from the past 240 hours).   Radiology Studies: No results found.    Macon Lesesne T. Lorris Carducci [facility]  If 7PM-7AM, please contact night-coverage [email or web address] 08/22/2023, 1:58 PM

## 2023-08-22 NOTE — Progress Notes (Signed)
Occupational Therapy Treatment Patient Details Name: Francisco Padilla MRN: [id number] DOB: 1950/12/02 Today's Date: 08/22/2023   History of present illness Pt is a 73 y/o male admitted for sepsis due to acute cholecystitis and AKI. Gangrenous cholecystitis s/p lap subtotal cholecystectomy 1/8. MRCP 1/15 revealed an undrained fluid collection that the surgical drain traverses, but is unable to completely evacuate. IR placed 2 perc drains and PTC drain 1/17. PMH: OSA on CPAP, PAF, PVD, hypotension, HLD, GERD, BPH, Roux-en-Y gastric bypass ([name] 2008)   OT comments  Patient demonstrating limited progress due to posterior leaning while seated on EOB and standing. Patient able to perform grooming and UB bathing/dressing with setup seated. Patient will benefit from continued inpatient follow up therapy, <3 hours/day. Acute OT to continue to follow to address established goals to facilitate DC to next venue of care.       If plan is discharge home, recommend the following:  Assistance with cooking/housework;A lot of help with walking and/or transfers;A lot of help with bathing/dressing/bathroom;Assist for transportation   Equipment Recommendations  Other (comment) (RW)    Recommendations for Other Services      Precautions / Restrictions Precautions Precautions: Fall Precaution Comments: 2 JP drains on R. Pleural drain right. Chronically low BP. Restrictions Weight Bearing Restrictions Per Provider Order: No       Mobility Bed Mobility Overal bed mobility: Needs Assistance Bed Mobility: Supine to Sit     Supine to sit: Min assist, HOB elevated, Used rails     General bed mobility comments: cues for rail use and min assist to scoot to EOB    Transfers Overall transfer level: Needs assistance Equipment used: Rolling walker (2 wheels) Transfers: Sit to/from Stand, Bed to chair/wheelchair/BSC Sit to Stand: Mod assist     Step pivot transfers: Mod assist     General transfer  comment: patient demonstrating posterior leaning requiring mod assist to stand and for balance during transfer     Balance Overall balance assessment: Needs assistance Sitting-balance support: Feet supported, Bilateral upper extremity supported Sitting balance-Leahy Scale: Poor Sitting balance - Comments: posterior leaning with patient requiring min to CGA for sitting balance Postural control: Posterior lean Standing balance support: Bilateral upper extremity supported, During functional activity, Reliant on assistive device for balance Standing balance-Leahy Scale: Poor Standing balance comment: mod assist for balance                           ADL either performed or assessed with clinical judgement   ADL Overall ADL's : Needs assistance/impaired Eating/Feeding: Set up;Sitting Eating/Feeding Details (indicate cue type and reason): at end of session in recliner Grooming: Wash/dry hands;Wash/dry face;Set up;Sitting Grooming Details (indicate cue type and reason): in recliner Upper Body Bathing: Set up;Sitting       Upper Body Dressing : Set up;Sitting Upper Body Dressing Details (indicate cue type and reason): changed gown     Toilet Transfer: Moderate assistance;Ambulation;Rollator (4 wheels) Toilet Transfer Details (indicate cue type and reason): simulated to recliner         Functional mobility during ADLs: Moderate assistance General ADL Comments: heavy posterior leaning this session    Extremity/Trunk Assessment              Vision       Perception     Praxis      Cognition Arousal: Alert Behavior During Therapy: WFL for tasks assessed/performed Overall Cognitive Status: Impaired/Different from baseline Area of Impairment: Safety/judgement,  Orientation                 Orientation Level: Person (believed he was in [facility])   Memory: Decreased short-term memory   Safety/Judgement: Decreased awareness of safety, Decreased  awareness of deficits   Problem Solving: Slow processing General Comments: cues for safety and orientation        Exercises      Shoulder Instructions       General Comments 96% on RA HR 92    Pertinent Vitals/ Pain       Pain Assessment Pain Assessment: Faces Faces Pain Scale: Hurts a little bit Pain Location: R side Pain Descriptors / Indicators: Aching, Discomfort Pain Intervention(s): Monitored during session, Repositioned  Home Living                                          Prior Functioning/Environment              Frequency  Min 1X/week        Progress Toward Goals  OT Goals(current goals can now be found in the care plan section)  Progress towards OT goals: Progressing toward goals  Acute Rehab OT Goals Patient Stated Goal: get stronger OT Goal Formulation: With patient Time For Goal Achievement: 08/28/23 Potential to Achieve Goals: Good ADL Goals Pt Will Perform Lower Body Dressing: with modified independence;with adaptive equipment;sitting/lateral leans;sit to/from stand Pt Will Transfer to Toilet: with modified independence;ambulating Additional ADL Goal #1: Pt to verbalize at least 3 fall prevention strategies to implement  Plan      Co-evaluation                 AM-PAC OT "6 Clicks" Daily Activity     Outcome Measure   Help from another person eating meals?: None Help from another person taking care of personal grooming?: A Little Help from another person toileting, which includes using toliet, bedpan, or urinal?: A Lot Help from another person bathing (including washing, rinsing, drying)?: A Lot Help from another person to put on and taking off regular upper body clothing?: A Little Help from another person to put on and taking off regular lower body clothing?: A Lot 6 Click Score: 16    End of Session Equipment Utilized During Treatment: Rolling walker (2 wheels)  OT Visit Diagnosis: Unsteadiness on feet  (R26.81);Other abnormalities of gait and mobility (R26.89);Muscle weakness (generalized) (M62.81)   Activity Tolerance Patient tolerated treatment well   Patient Left in chair;with call bell/phone within reach;with chair alarm set   Nurse Communication Mobility status        Time: 8295-6213 OT Time Calculation (min): 24 min  Charges: OT General Charges $OT Visit: 1 Visit OT Treatments $Self Care/Home Management : 23-37 mins  Alfonse Flavors, OTA Acute Rehabilitation Services  Office [phone]   Dewain Penning 08/22/2023, 8:30 AM

## 2023-08-22 NOTE — Progress Notes (Signed)
Physical Therapy Treatment Patient Details Name: Francisco Padilla MRN: [id number] DOB: 04-01-1951 Today's Date: 08/22/2023   History of Present Illness Pt is a 73 y/o male admitted 07/28/23 for sepsis due to acute cholecystitis and AKI. Gangrenous cholecystitis s/p lap subtotal cholecystectomy 1/8. MRCP 1/15 revealed an undrained fluid collection that the surgical drain traverses, but is unable to completely evacuate. IR placed 2 perc drains and PTC drain 1/17. 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 However, he continues to display knee flexion in stance, excess trunk flexed posture, and a narrow BOS when ambulating. Pt can be a bit resistive to follow cues provided to attempt to correct these gait deviations as he reports he is unable to correct them for one reason or another. He remains at high risk for falls. Will continue to follow acutely.       If plan is discharge home, recommend the following: Assistance with cooking/housework;Direct supervision/assist for medications management;Direct supervision/assist for financial management;Assist for transportation;Supervision due to cognitive status;Help with stairs or ramp for entrance;A lot of help with bathing/dressing/bathroom;A little help with walking and/or transfers   Can travel by private vehicle     No  Equipment Recommendations  Rolling walker (2 wheels);BSC/3in1    Recommendations for Other Services       Precautions / Restrictions Precautions Precautions: Fall Precaution Comments: 2 JP drains on R. Pleural drain on R. Chronically low BP. Restrictions Weight Bearing Restrictions Per Provider Order: No     Mobility  Bed Mobility Overal bed mobility: Needs Assistance Bed Mobility: Sit to Supine       Sit to supine: Supervision, HOB elevated   General bed mobility  comments: HOB elevated, extra time for pt to position self to transition sit to supine, supervision for safety    Transfers Overall transfer level: Needs assistance Equipment used: Rolling walker (2 wheels) Transfers: Sit to/from Stand Sit to Stand: Min assist           General transfer comment: Pt needed x2 attempts before successfully coming to stand from the recliner the first rep. He demonstrated instability in his knees and trunk with transitioning his hands from the chair to the RW, needing minA for balance each of the x2 successful reps to stand. Cues provided for lining legs up and reaching back before sitting.    Ambulation/Gait Ambulation/Gait assistance: Min assist, Contact guard assist, +2 safety/equipment Gait Distance (Feet): 130 Feet (x2 bouts of ~130 ft each bout) Assistive device: Rolling walker (2 wheels) Gait Pattern/deviations: Decreased step length - left, Decreased step length - right, Trunk flexed, Narrow base of support, Step-through pattern, Decreased stride length, Shuffle, Decreased dorsiflexion - right, Knee flexed in stance - right, Knee flexed in stance - left Gait velocity: reduced Gait velocity interpretation: 1.31 - 2.62 ft/sec, indicative of limited community ambulator   General Gait Details: The pt did well staying within the RW when ambulating but needed VCs for upright posture, wider BOS, extending his knees during stance phase, and clearing his R foot better when stepping, but pt often resistive to cues and stating there was a reason he could not follow any of them. No LOB, CGA-minA for balance and safety, chair follow for safety. x1 seated rest break between bouts   Optometrist  Tilt Bed    Modified Rankin (Stroke Patients Only)       Balance Overall balance assessment: Needs assistance Sitting-balance support: Feet supported Sitting balance-Leahy Scale: Fair     Standing balance support: Bilateral  upper extremity supported, During functional activity, Reliant on assistive device for balance Standing balance-Leahy Scale: Poor Standing balance comment: Pt uses RW and intermittently needs minA                            Cognition Arousal: Alert Behavior During Therapy: WFL for tasks assessed/performed Overall Cognitive Status: Impaired/Different from baseline Area of Impairment: Safety/judgement, Memory, Problem solving                     Memory: Decreased short-term memory   Safety/Judgement: Decreased awareness of safety, Decreased awareness of deficits   Problem Solving: Slow processing, Requires verbal cues General Comments: OT notified PT earlier that pt was leaning posteriorly more today and pt and OT both agreed that walking to sink may not be the safest option this AM. Upon this PT's arrival, pt became defensive when his posterior lean was mentioned and indicated that he did better than mentioned by OT, suggesting potential poor memory or insight into deficits. When this PT attempted to provide cues to correct his gait, the pt was a bit resistive to cues, often would state "Well I can't because.." of some reason or another.        Exercises      General Comments General comments (skin integrity, edema, etc.): VSS on RA; encouraged AROM of legs as HEP, pt verbalized understanding, but declined sit <> stand reps as exercise this date      Pertinent Vitals/Pain Pain Assessment Pain Assessment: Faces Faces Pain Scale: Hurts a little bit Pain Location: drain sites on R side Pain Descriptors / Indicators: Discomfort, Operative site guarding Pain Intervention(s): Limited activity within patient's tolerance, Monitored during session, Repositioned    Home Living                          Prior Function            PT Goals (current goals can now be found in the care plan section) Acute Rehab PT Goals Patient Stated Goal: Go Home PT Goal  Formulation: With patient Time For Goal Achievement: 08/29/23 Potential to Achieve Goals: Good Progress towards PT goals: Progressing toward goals    Frequency    Min 1X/week      PT Plan      Co-evaluation              AM-PAC PT "6 Clicks" Mobility   Outcome Measure  Help needed turning from your back to your side while in a flat bed without using bedrails?: A Little Help needed moving from lying on your back to sitting on the side of a flat bed without using bedrails?: A Little Help needed moving to and from a bed to a chair (including a wheelchair)?: A Little Help needed standing up from a chair using your arms (e.g., wheelchair or bedside chair)?: A Little Help needed to walk in hospital room?: A Little Help needed climbing 3-5 steps with a railing? : A Lot 6 Click Score: 17    End of Session Equipment Utilized During Treatment: Gait belt Activity Tolerance: Patient limited by fatigue;Patient tolerated treatment well Patient left: with call bell/phone within reach;in bed;with  bed alarm set   PT Visit Diagnosis: Other abnormalities of gait and mobility (R26.89);Muscle weakness (generalized) (M62.81);Unsteadiness on feet (R26.81);Difficulty in walking, not elsewhere classified (R26.2)     Time: 4098-1191 PT Time Calculation (min) (ACUTE ONLY): 30 min  Charges:    $Gait Training: 23-37 mins PT General Charges $$ ACUTE PT VISIT: 1 Visit                     Virgil Benedict, PT, DPT Acute Rehabilitation Services  Office: [phone]    Bettina Gavia 08/22/2023, 12:37 PM

## 2023-08-22 NOTE — TOC Progression Note (Addendum)
Transition of Care [facility]) - Progression Note    Patient Details  Name: Francisco Padilla MRN: [id number] Date of Birth: May 06, 1951  Transition of Care [facility] CM/SW Contact  Michaela Corner, [place] Phone Number: 08/22/2023, 8:34 AM  Clinical Narrative:   Per chart review, final disposition is SNF once cleared by surgery.  3:54 PM: CSW informed [facility] of disposition.   Expected Discharge Plan: Skilled Nursing Facility Barriers to Discharge: Continued Medical Work up  Expected Discharge Plan and Services In-house Referral: NA Discharge Planning Services: CM Consult Post Acute Care Choice: NA Living arrangements for the past 2 months: Single Family Home                 DME Arranged: N/A DME Agency: NA       HH Arranged: NA           Social Determinants of Health (SDOH) Interventions SDOH Screenings   Food Insecurity: No Food Insecurity (07/29/2023)  Housing: Low Risk  (07/29/2023)  Transportation Needs: No Transportation Needs (07/29/2023)  Utilities: Not At Risk (07/29/2023)  Social Connections: Patient Declined (07/31/2023)  Tobacco Use: Low Risk  (08/01/2023)    Readmission Risk Interventions     No data to display

## 2023-08-23 DIAGNOSIS — K810 Acute cholecystitis: Secondary | ICD-10-CM | POA: Diagnosis not present

## 2023-08-23 DIAGNOSIS — I350 Nonrheumatic aortic (valve) stenosis: Secondary | ICD-10-CM | POA: Diagnosis not present

## 2023-08-23 DIAGNOSIS — N179 Acute kidney failure, unspecified: Secondary | ICD-10-CM | POA: Diagnosis not present

## 2023-08-23 DIAGNOSIS — G4733 Obstructive sleep apnea (adult) (pediatric): Secondary | ICD-10-CM | POA: Diagnosis not present

## 2023-08-23 LAB — COMPREHENSIVE METABOLIC PANEL
ALT: 21 U/L (ref 0–44)
AST: 17 U/L (ref 15–41)
Albumin: 1.9 g/dL — ABNORMAL LOW (ref 3.5–5.0)
Alkaline Phosphatase: 183 U/L — ABNORMAL HIGH (ref 38–126)
Anion gap: 7 (ref 5–15)
BUN: 23 mg/dL (ref 8–23)
CO2: 27 mmol/L (ref 22–32)
Calcium: 8.3 mg/dL — ABNORMAL LOW (ref 8.9–10.3)
Chloride: 98 mmol/L (ref 98–111)
Creatinine, Ser: 0.6 mg/dL — ABNORMAL LOW (ref 0.61–1.24)
GFR, Estimated: >60 mL/min (ref >60)
Glucose, Bld: 88 mg/dL (ref 70–99)
Potassium: 4.4 mmol/L (ref 3.5–5.1)
Sodium: 132 mmol/L — ABNORMAL LOW (ref 135–145)
Total Bilirubin: 0.9 mg/dL (ref 0.0–1.2)
Total Protein: 5.5 g/dL — ABNORMAL LOW (ref 6.5–8.1)

## 2023-08-23 LAB — CBC
HCT: 26.5 % — ABNORMAL LOW (ref 39.0–52.0)
Hemoglobin: 8.9 g/dL — ABNORMAL LOW (ref 13.0–17.0)
MCH: 30.4 pg (ref 26.0–34.0)
MCHC: 33.6 g/dL (ref 30.0–36.0)
MCV: 90.4 fL (ref 80.0–100.0)
Platelets: 407 10*3/uL — ABNORMAL HIGH (ref 150–400)
RBC: 2.93 MIL/uL — ABNORMAL LOW (ref 4.22–5.81)
RDW: 14.1 % (ref 11.5–15.5)
WBC: 11.2 10*3/uL — ABNORMAL HIGH (ref 4.0–10.5)
nRBC: 0 % (ref 0.0–0.2)

## 2023-08-23 LAB — MAGNESIUM: Magnesium: 1.8 mg/dL (ref 1.7–2.4)

## 2023-08-23 MED ORDER — CIPROFLOXACIN HCL 500 MG PO TABS: 500.0000 mg | ORAL_TABLET | Freq: Two times a day (BID) | ORAL | Status: AC

## 2023-08-23 MED ORDER — TAMSULOSIN HCL 0.4 MG PO CAPS: 0.4000 mg | ORAL_CAPSULE | Freq: Every day | ORAL | Status: AC

## 2023-08-23 MED ORDER — POLYETHYLENE GLYCOL 3350 17 GM/SCOOP PO POWD: 17.0000 g | Freq: Two times a day (BID) | ORAL | Status: AC | PRN

## 2023-08-23 MED ORDER — AMPHETAMINE-DEXTROAMPHETAMINE 5 MG PO TABS: 5.0000 mg | ORAL_TABLET | Freq: Every day | ORAL | 0 refills | Status: AC | PRN

## 2023-08-23 MED ORDER — FLUDROCORTISONE ACETATE 0.1 MG PO TABS: 0.1000 mg | ORAL_TABLET | Freq: Two times a day (BID) | ORAL | Status: AC

## 2023-08-23 MED ORDER — OXYCODONE HCL 5 MG PO TABS: 5.0000 mg | ORAL_TABLET | ORAL | 0 refills | Status: AC | PRN

## 2023-08-23 MED ORDER — METRONIDAZOLE 500 MG PO TABS: 500.0000 mg | ORAL_TABLET | Freq: Two times a day (BID) | ORAL | Status: AC

## 2023-08-23 MED ORDER — SENNOSIDES-DOCUSATE SODIUM 8.6-50 MG PO TABS: 1.0000 | ORAL_TABLET | Freq: Two times a day (BID) | ORAL | Status: AC | PRN

## 2023-08-23 MED ORDER — SODIUM CHLORIDE 0.9% FLUSH: 5.0000 mL | Freq: Three times a day (TID) | INTRAVENOUS | Status: AC

## 2023-08-23 MED ORDER — ZOLPIDEM TARTRATE 5 MG PO TABS: 5.0000 mg | ORAL_TABLET | Freq: Every day | ORAL | 0 refills | Status: AC

## 2023-08-23 NOTE — Discharge Summary (Addendum)
Physician Discharge Summary  Francisco Padilla ZOX:[id number] DOB: 04-20-1951 DOA: 07/28/2023  PCP: [name], MD  Admit date: 07/28/2023 Discharge date: 08/23/2023 Admitted From: Home Disposition: SNF Recommendations for Outpatient Follow-up:  Outpatient follow-up with cardiology, general surgery, interventional radiology and ID Check CMP and CBC in 1 week Please follow up on the following pending results: None   Discharge Condition: Stable CODE STATUS: Full code  Follow-up Information     Sharlene Dory, PA-C Follow up.   Specialty: Cardiology Why: Friday Aug 31, 2023 Arrive by 8:10 AMAppt at 8:25 AM (25 min) Contact information: [address] [place] [address] [phone]         Moise Boring, MD Follow up on 08/28/2023.   Specialty: General Surgery Why: 4:00pm, Arrive 30 minutes prior to your appointment time, Please bring your insurance card and photo ID Contact information: [address] [place] [address] [phone]         Gilmer Mor, DO Follow up in 1 week(s).   Specialties: Interventional Radiology, Radiology Why: Office will call you with a follow up appointment Contact information: [address] [address] [place] [address] [phone]         [facility] Follow up.   Specialty: Radiology Why: Schedulers will contact you for date and time of follow-up Contact information: [address] [place] [address] [phone]                Hospital course 72 year old M with PMH of OSA on CPAP, hypertension, PAF on Eliquis, PVD and bariatric surgery presented to ED on 1/5 with generalized weakness and abdominal pain and admitted with acute cholecystitis as noted on CT abdomen and pelvis.  Started on IV Zosyn.  General surgery consulted.  He underwent laparoscopic cholecystectomy on 1/8 after Eliquis washout cardiac clearance by cardiology.  He is TTE showed LVEF  of 60 to 65% with severe AS.  While in OR, patient became tachycardic and started on esmolol.  Later, he became hypotensive and started on pressor and transferred to ICU.  Patient continued to have right-sided abdominal pain.  MRI abdomen on 1/15 showed residual gallbladder lumen and soft tissue debris's in surgical bed with associated 10.8 cm fluid collection extending from the gallbladder fossa into the right perihepatic space.  Patient had percutaneous biliary drain placed on 1/17.  Fluid culture with beta-lactamase positive E. coli.  Antibiotics changed to IV meropenem on 1/20.  PTC was capped on 1/20 but he developed right-sided abdominal pain again.  Repeat CT abdomen and pelvis on 1/22 showed 5.5 x 10.5 cm abscess.  PTC was uncapped and 2 JP drains placed by IR on 1/22.  He was continued on IV meropenem.  Eventually, symptoms and pain improved.  General surgery start recapping biliary drain again on 1/27 that he tolerated since then.  Patient continued to have significant purulent output from JP drain.  ID consulted for guidance on antibiotics and gave recommendation (see below).  Therapy recommended SNF.  Medically stable for discharge.  He is cleared for discharge for outpatient follow-up by all consultants.   See individual problem list below for more.   Problems addressed during this hospitalization Sepsis due to acute gangrenous cholecystitis: -S/p subtotal cholecystectomy by Dr. Hillery Hunter on 1/8  -MRCP on 1/15: undrained fluid collection that the surgical drain traverses but unable to completely evacuate -S/p image guided drain into the subhepatic abscess and PTC on  1/17 (Dr. Loreta Ave) -Abscess cultures on 1/17 with beta-lactamase positive for E. coli.  IV Zosyn changed to meropenem on 1/20. -ID de-escalated antibiotics to CTX and Flagyl on 1/28 and recommended Cipro and Flagyl for 4 weeks on discharge. -PTC capped by IR on 1/20 but uncapped on 1/22 due to pain. PTC recapped on 1/27. Tolerated  this for over 60 hours -Per surgery, patient can be discharged if he tolerates PTC capping for 48 hours. -Continue JP drains for the subhepatic fluid collection.  Still with significant output. -Eliquis was resumed on 1/20 anticipating no further procedures -Follow-up with IR, ID and surgery -Therapy recommended SNF   Chronic hypotension: On Solu-Cortef and midodrine at home.  TTE with severe aortic stenosis.  TSH, B12, folate and a.m. cortisol normal.  Received IV albumin.  Normotensive for most part. -Continue midodrine 15 mg 3 times daily -Increased Florinef to twice daily -Cardiology signed off.  Recommended outpatient follow-up as above   Severe aortic stenosis: Noted on TTE for preoperative evaluation. -Outpatient follow-up with cardiology.   AKI- resolved   Hyponatremia-mild.  Asymptomatic -Increased Florinef. -Recheck in 1 week   Persistent A-fib with RVR: Rate controlled without meds. -Continue eliquis   History of Roux-en-Y gastric bypass -Follow up as outpatient.    OSA on CPAP -Continue Home Cpap.    Severe malnutrition: Body mass index is 24.45 kg/m. Nutrition Problem: Severe Malnutrition Etiology: chronic illness (gastric bypass) Signs/Symptoms: severe fat depletion, severe muscle depletion Interventions: Ensure Enlive (each supplement provides 350kcal and 20 grams of protein), MVI, Carnation Instant Breakfast   Pressure skin injury Pressure Injury 08/07/23 Buttocks Right Stage 2 -  Partial thickness loss of dermis presenting as a shallow open injury with a red, pink wound bed without slough. round and red (Active)  08/07/23 2000  Location: Buttocks  Location Orientation: Right  Staging: Stage 2 -  Partial thickness loss of dermis presenting as a shallow open injury with a red, pink wound bed without slough.  Wound Description (Comments): round and red  Present on Admission: No  Dressing Type Foam - Lift dressing to assess site every shift 08/22/23 0800      Time spent 35 minutes  Vital signs Vitals:   08/24/23 0154 08/24/23 0631 08/24/23 0805 08/24/23 1223  BP: 107/74 105/82 115/76 92/71  Pulse: (!) 104 90 96 98  Temp: 98.3 F (36.8 C) 98.2 F (36.8 C)  97.8 F (36.6 C)  Resp: 18 18 16 17   Height:      Weight:      SpO2: 100% 98% 100% 99%  TempSrc: Oral Oral    BMI (Calculated):         Discharge exam  GENERAL: No apparent distress.  Nontoxic. HEENT: MMM.  Vision and hearing grossly intact.  NECK: Supple.  No apparent JVD.  RESP:  No IWOB.  Fair aeration bilaterally. CVS:  RRR. Heart sounds normal.  ABD/GI/GU: BS+. Abd soft, NTND.  2 JP drains to right abdomen with with some purulent output.  Biliary drain capped.  MSK/EXT:  Moves extremities. No apparent deformity. No edema.  SKIN: no apparent skin lesion or wound NEURO: Awake, alert and oriented appropriately.  No apparent focal neuro deficit. PSYCH: Calm. Normal affect.  Discharge Instructions Discharge Instructions     Diet general   Complete by: As directed    Increase activity slowly   Complete by: As directed    No wound care   Complete by: As directed  Allergies as of 08/24/2023       Reactions   Aspirin    Avoid due to bariatric surgery   Bupropion Other (See Comments)   "Passed out"   Nsaids    Avoid due to bariatric surgery   Oxycodone Hcl    Hallucinations, agitation         Medication List     STOP taking these medications    acetaminophen 650 MG CR tablet Commonly known as: TYLENOL Replaced by: acetaminophen 325 MG tablet   alfuzosin 10 MG 24 hr tablet Commonly known as: UROXATRAL   atorvastatin 20 MG tablet Commonly known as: LIPITOR   cetirizine 10 MG tablet Commonly known as: ZYRTEC   CO Q 10 PO   COSAMIN DS PO   fluticasone 50 MCG/ACT nasal spray Commonly known as: FLONASE   LORazepam 1 MG tablet Commonly known as: ATIVAN   Melatonin 10 MG Tabs   OSTEO BI-FLEX ADV TRIPLE ST PO   PROBIOTIC PO    sildenafil 20 MG tablet Commonly known as: REVATIO   sodium chloride 0.65 % Soln nasal spray Commonly known as: OCEAN   SUPER B COMPLEX/C PO       TAKE these medications    acetaminophen 325 MG tablet Commonly known as: TYLENOL Take 2 tablets (650 mg total) by mouth every 4 (four) hours. Replaces: acetaminophen 650 MG CR tablet   acidophilus Caps capsule Take 1 capsule by mouth daily.   amphetamine-dextroamphetamine 5 MG tablet Commonly known as: ADDERALL Take 1 tablet (5 mg total) by mouth daily as needed (for ADD).   ciprofloxacin 500 MG tablet Commonly known as: Cipro Take 1 tablet (500 mg total) by mouth 2 (two) times daily for 28 days.   desvenlafaxine 50 MG 24 hr tablet Commonly known as: PRISTIQ Take 50 mg by mouth daily.   Eliquis 5 MG Tabs tablet Generic drug: apixaban Take 5 mg by mouth 2 (two) times daily.   feeding supplement Liqd Take 237 mLs by mouth 2 (two) times daily between meals.   fludrocortisone 0.1 MG tablet Commonly known as: FLORINEF Take 1 tablet (0.1 mg total) by mouth 2 (two) times daily. What changed: when to take this   gabapentin 300 MG capsule Commonly known as: NEURONTIN Take 2 capsules (600 mg total) by mouth at bedtime.   latanoprost 0.005 % ophthalmic solution Commonly known as: XALATAN Place 1 drop into both eyes at bedtime.   metroNIDAZOLE 500 MG tablet Commonly known as: Flagyl Take 1 tablet (500 mg total) by mouth 2 (two) times daily for 28 days.   midodrine 5 MG tablet Commonly known as: PROAMATINE Take 1 tablet (5 mg total) by mouth 3 (three) times daily with meals.   One Daily Multivitamin Men Tabs Take 1 tablet by mouth 2 (two) times daily. CENTRUM SILVER   oxyCODONE 5 MG immediate release tablet Commonly known as: Oxy IR/ROXICODONE Take 1 tablet (5 mg total) by mouth every 4 (four) hours as needed for moderate pain (pain score 4-6) or severe pain (pain score 7-10).   pantoprazole 40 MG tablet Commonly  known as: PROTONIX Take 40 mg by mouth in the morning and at bedtime.   polyethylene glycol powder 17 GM/SCOOP powder Commonly known as: MiraLax Take 17 g by mouth 2 (two) times daily as needed for moderate constipation.   senna-docusate 8.6-50 MG tablet Commonly known as: Senokot-S Take 1 tablet by mouth 2 (two) times daily between meals as needed for mild constipation.   sodium  chloride flush 0.9 % Soln Commonly known as: NS 5 mLs by Intracatheter route every 8 (eight) hours.   tamsulosin 0.4 MG Caps capsule Commonly known as: FLOMAX Take 1 capsule (0.4 mg total) by mouth daily after breakfast.   zolpidem 5 MG tablet Commonly known as: AMBIEN Take 1 tablet (5 mg total) by mouth at bedtime. What changed:  medication strength how much to take        Consultations: General surgery Interventional cardiology Cardiology Infectious disease  Procedures/Studies: 1/17: Image guided drain into subhepatic abscess, PTC 1/20: PTC capped by IR, LFTs normal 1/22: Right-sided abdominal pain, reconnected PTC, CT abdomen   DG Abd 1 View Result Date: 08/18/2023 CLINICAL DATA:  Bile leak scratched at postoperative bile leak. Acute cholecystitis. EXAM: ABDOMEN - 1 VIEW COMPARISON:  IR procedure film 08/15/2023. CT of the abdomen and pelvis 08/15/2023. FINDINGS: Bowel gas pattern is normal.  Right hemidiaphragm remains elevated. The biliary drain is stable in position. A pigtail drainage catheter and large bore right-sided drain are stable. IMPRESSION: 1. Stable position of biliary drain. 2. Stable position of pigtail drainage catheter and large bore right-sided drain. 3. No acute abnormality. Electronically Signed   By: Marin Roberts M.D.   On: 08/18/2023 11:45   IR US Guide Bx Asp/Drain Result Date: 08/15/2023 INDICATION: 73 year old male with history of gangrenous cholecystitis status post partial cholecystectomy necessitating percutaneous biliary drain placement and biloma drain  placement on 08/10/2023. Capping trial failed with significantly increased abdominal pain and sepsis. CT abdomen pelvis demonstrates malpositioned biliary drain and perihepatic fluid. EXAM: 1. Biliary drain check and exchange. 2. Percutaneous aspiration of perihepatic fluid. MEDICATIONS: Rocephin 2 gm IV; The antibiotic was administered within an appropriate time frame prior to the initiation of the procedure. ANESTHESIA/SEDATION: None. FLUOROSCOPY TIME:  24.5 mGy COMPLICATIONS: None immediate. PROCEDURE: Informed written consent was obtained from the patient after a thorough discussion of the procedural risks, benefits and alternatives. All questions were addressed. Maximal Sterile Barrier Technique was utilized including caps, mask, sterile gowns, sterile gloves, sterile drape, hand hygiene and skin antiseptic. A timeout was performed prior to the initiation of the procedure. Procedure scout radiograph of the right upper quadrant demonstrated kinking about the perihepatic aspect of the indwelling biliary drain with the radiopaque side hole near the periphery of the liver. Subdermal Local anesthesia was provided at the planned needle entry site with 1% lidocaine. Contrast injection demonstrated patency of the drain, however there was spillage into the perihepatic space. No definite evidence of biliary ductal dilation. The external portion of the drain was cut to release inter pigtail. An Amplatz wire was then inserted. The drain was removed over the wire. A Kumpe the catheter was placed over the wire and directed into the duodenum. Hand injection of contrast via catheter demonstrated intraluminal position. The Amplatz wire was then reinserted and the 5 French catheter was exchanged for a new, 12 [place], 40 cm biliary drain. The radiopaque marker was positioned just lateral to the hilum. The pigtail portion was formed in the duodenum. Hand injection of contrast demonstrated patency of the indwelling drain with  persistent bile leak in the region of the proximal common bile duct. Ultrasound evaluation of the right upper quadrant demonstrated a small heterogeneously hypoechoic collection surrounding liver. Under direct ultrasound visualization, a 19 gauge, 7 cm Yueh catheter was directed into the collection. Aspiration was performed which showed yielded trace, thick bilious and bloody fluid. The fluid is very difficult to aspirate given gelatinous nature. The [place]  catheter was removed. The drain was affixed to the skin with an interrupted 0 silk suture. Sterile bandage was applied. The patient tolerated the procedure well was transferred back to the floor in good condition. IMPRESSION: 1. Technically successful exchange and repositioning of malpositioned indwelling 12 [place] biliary drain. 2. Attempted perihepatic fluid aspiration yielded trace thick bilious and bloody fluid. Given prior drain malposition, this fluid likely represents bilious leakage which should resolve spontaneously via indwelling drainage catheters. Marliss Coots, MD [facility] Radiology Electronically Signed   By: Marliss Coots M.D.   On: 08/15/2023 22:05   IR EXCHANGE BILIARY DRAIN Result Date: 08/15/2023 INDICATION: 73 year old male with history of gangrenous cholecystitis status post partial cholecystectomy necessitating percutaneous biliary drain placement and biloma drain placement on 08/10/2023. Capping trial failed with significantly increased abdominal pain and sepsis. CT abdomen pelvis demonstrates malpositioned biliary drain and perihepatic fluid. EXAM: 1. Biliary drain check and exchange. 2. Percutaneous aspiration of perihepatic fluid. MEDICATIONS: Rocephin 2 gm IV; The antibiotic was administered within an appropriate time frame prior to the initiation of the procedure. ANESTHESIA/SEDATION: None. FLUOROSCOPY TIME:  24.5 mGy COMPLICATIONS: None immediate. PROCEDURE: Informed written consent  was obtained from the patient after a thorough discussion of the procedural risks, benefits and alternatives. All questions were addressed. Maximal Sterile Barrier Technique was utilized including caps, mask, sterile gowns, sterile gloves, sterile drape, hand hygiene and skin antiseptic. A timeout was performed prior to the initiation of the procedure. Procedure scout radiograph of the right upper quadrant demonstrated kinking about the perihepatic aspect of the indwelling biliary drain with the radiopaque side hole near the periphery of the liver. Subdermal Local anesthesia was provided at the planned needle entry site with 1% lidocaine. Contrast injection demonstrated patency of the drain, however there was spillage into the perihepatic space. No definite evidence of biliary ductal dilation. The external portion of the drain was cut to release inter pigtail. An Amplatz wire was then inserted. The drain was removed over the wire. A Kumpe the catheter was placed over the wire and directed into the duodenum. Hand injection of contrast via catheter demonstrated intraluminal position. The Amplatz wire was then reinserted and the 5 French catheter was exchanged for a new, 12 [place], 40 cm biliary drain. The radiopaque marker was positioned just lateral to the hilum. The pigtail portion was formed in the duodenum. Hand injection of contrast demonstrated patency of the indwelling drain with persistent bile leak in the region of the proximal common bile duct. Ultrasound evaluation of the right upper quadrant demonstrated a small heterogeneously hypoechoic collection surrounding liver. Under direct ultrasound visualization, a 19 gauge, 7 cm Yueh catheter was directed into the collection. Aspiration was performed which showed yielded trace, thick bilious and bloody fluid. The fluid is very difficult to aspirate given gelatinous nature. The Yueh catheter was removed. The drain was affixed to the skin with an interrupted 0  silk suture. Sterile bandage was applied. The patient tolerated the procedure well was transferred back to the floor in good condition. IMPRESSION: 1. Technically successful exchange and repositioning of malpositioned indwelling 12 [place] biliary drain. 2. Attempted perihepatic fluid aspiration yielded trace thick bilious and bloody fluid. Given prior drain malposition, this fluid likely represents bilious leakage which should resolve spontaneously via indwelling drainage catheters. Marliss Coots, MD [facility] Radiology Electronically Signed   By: Marliss Coots M.D.   On: 08/15/2023 22:05   CT ABDOMEN PELVIS W CONTRAST Result Date:  08/15/2023 CLINICAL DATA:  Intra-abdominal abscess. EXAM: CT ABDOMEN AND PELVIS WITH CONTRAST TECHNIQUE: Multidetector CT imaging of the abdomen and pelvis was performed using the standard protocol following bolus administration of intravenous contrast. RADIATION DOSE REDUCTION: This exam was performed according to the departmental dose-optimization program which includes automated exposure control, adjustment of the mA and/or kV according to patient size and/or use of iterative reconstruction technique. CONTRAST:  75mL OMNIPAQUE IOHEXOL 350 MG/ML SOLN COMPARISON:  CT of the abdomen pelvis dated 07/28/2023. FINDINGS: Lower chest: Partially visualized bilateral pleural effusions, right greater than left with associated partial compressive atelectasis of the lower lobes versus pneumonia. This is new since the prior CT. There is 3 vessel coronary vascular calcification. The tip of a central venous line noted at the cavoatrial junction. Tiny pockets of air adjacent to the liver, likely introduced via catheters. There is a small ascites new or significantly progressed since the prior CT. Hepatobiliary: Irregular liver contour suggestive of cirrhosis. Cholecystectomy. There is a 5.5 x 10.5 cm complex collection containing air at the  cholecystectomy bed consistent with an abscess. Percutaneous transhepatic internal/external drain with tip in the region of the second portion of the duodenum and a percutaneous subhepatic drainage catheter with tip in the collection/abscess noted. Pancreas: The pancreas is moderately atrophic and poorly visualized. Spleen: Normal in size without focal abnormality. Adrenals/Urinary Tract: The adrenal glands unremarkable. Multiple nonobstructing bilateral renal calculi measure up to 5 mm in the interpolar right kidney. There is a 9 mm stone in the distal left ureter with mild left hydronephrosis. The right ureter and urinary bladder appear unremarkable. Stomach/Bowel: There is sigmoid diverticulosis and scattered colonic diverticula. There is postsurgical changes of gastric bypass. There is a small hiatal hernia. There is no bowel obstruction. Vascular/Lymphatic: Moderate aortoiliac atherosclerotic disease. The IVC is unremarkable. No portal venous gas. There is no adenopathy. Reproductive: The prostate and seminal vesicles are grossly unremarkable. Other: There is diffuse subcutaneous edema and anasarca. Musculoskeletal: Osteopenia with degenerative changes. No acute osseous pathology. IMPRESSION: 1. Status post cholecystectomy with a 5.5 x 10.5 cm abscess. Percutaneous drainage catheter with tip in the abscess. 2. Percutaneous transhepatic internal/external drain with tip in the region of the second portion of the duodenum. 3. Small ascites new or significantly progressed since the prior CT. 4. Partially visualized bilateral pleural effusions, right greater than left, new since the prior CT. 5. A 9 mm stone in the distal left ureter with mild left hydronephrosis. 6. Nonobstructing bilateral renal calculi. 7. Sigmoid diverticulosis. No bowel obstruction. 8.  Aortic Atherosclerosis (ICD10-I70.0). Electronically Signed   By: Elgie Collard M.D.   On: 08/15/2023 17:08   DG Shoulder Right Result Date:  08/14/2023 CLINICAL DATA:  Right shoulder pain for several days EXAM: RIGHT SHOULDER - 2+ VIEW COMPARISON:  None Available. FINDINGS: Internal rotation, external rotation, transscapular, and axillary views of the right shoulder are obtained on 5 images. No acute fracture, subluxation, or dislocation. Mild hypertrophic changes of the acromioclavicular joint. Soft tissues are unremarkable. Continued vascular congestion within the visualized right hemithorax. Right-sided PICC partially visualized. IMPRESSION: 1. Acromioclavicular joint osteoarthritis. 2. No acute bony abnormality. Electronically Signed   By: Sharlet Salina M.D.   On: 08/14/2023 17:25   DG CHEST PORT 1 VIEW Result Date: 08/12/2023 CLINICAL DATA:  Dyspnea. EXAM: PORTABLE CHEST 1 VIEW COMPARISON:  08/03/2023 FINDINGS: Right-sided PICC line unchanged. Lungs are hypoinflated with mild prominence of the perihilar vasculature suggesting a mild degree of vascular congestion. No lobar consolidation or effusion.  Cardiomediastinal silhouette and remainder the exam is unchanged. IMPRESSION: Hypoinflation with mild vascular congestion. Electronically Signed   By: Elberta Fortis M.D.   On: 08/12/2023 12:47   IR US ABSCESS DRAIN PLACEMENT Result Date: 08/10/2023 INDICATION: 73 year old male with a history of biloma and bile leak after prior partial cholecystectomy. He presents for drainage and internal/external biliary drain. EXAM: ULTRASOUND-GUIDED DRAINAGE OF SUBHEPATIC ABSCESS IMAGE GUIDED PERCUTANEOUS TRANSHEPATIC CHOLANGIOGRAM WITH INTERNAL/EXTERNAL BILIARY DRAIN. MEDICATIONS: None ANESTHESIA/SEDATION: Moderate (conscious) sedation was employed during this procedure. A total of Versed 2.0 mg and Fentanyl 100 mcg, 1 mg Dilaudid, was administered intravenously by the radiology nurse. Total intra-service moderate Sedation Time: 82 minutes. The patient's level of consciousness and vital signs were monitored continuously by radiology nursing throughout the  procedure under my direct supervision. FLUOROSCOPY: Radiation Exposure Index (as provided by the fluoroscopic device): 387 mGy Kerma COMPLICATIONS: None PROCEDURE: The procedure, risks, benefits, and alternatives were explained to the patient and the patient's family. A complete informed consent was performed, with risk benefit analysis. Specific risks that were discussed for the procedure include bleeding, infection, biliary sepsis, IC use day, organ injury, need for further procedure, need for further surgery, long-term drain placement, cardiopulmonary collapse, death. Questions regarding the procedure were encouraged and answered. The patient understands and consents to the procedure. Patient is position in supine position on the fluoroscopy table, and the upper abdomen was prepped and draped in the usual sterile fashion. Maximum barrier sterile technique with sterile gowns and gloves were used for the procedure. A timeout was performed prior to the initiation of the procedure. Local anesthesia was provided with 1% lidocaine with epinephrine. Ultrasound survey was performed of the hilum of the liver and the subhepatic liver in the right upper quadrant. Significant gas was present in the hilum of the liver, and differentiating what may be colon versus is a gas filled collection in this area was not possible. We elected to drain the sub a Paddock collection as this appears to communicate on the preoperative MRI. 1% lidocaine was used for local anesthesia. A trocar needle was advanced into the subhepatic fluid collection with ultrasound guidance. Modified Seldinger technique was used to place a 10 French pigtail catheter drain. Immediate return of similar fluid to the surgical drain. We then proceeded with PTC. Ultrasound survey of the left liver lobe was performed, with then ultrasound of the right liver lobe. 1% lidocaine was used for local anesthesia, with generous infiltration of the skin and subcutaneous  tissues in and inter left costal location. A Chiba needle was advanced under ultrasound guidance into the right liver lobe, using ultrasound guidance in attempt to access a biliary duct. We were successful in accessing portal vein, hepatic vein, and at 1 point distal hepatic artery. Given the small size of the biliary system/decompressed biliary system ultrasound was abandoned. We then proceeded with a formal PTC attempt. The access needle was advanced from a mid axillary line, eleventh rib into the a Paddock parenchyma. A traditional pullback contrast injection technique was used. It took 3 passes of the needle before distal biliary radicles were opacified. Intermittent opacification of the biliary system using this first needle position was possible in opacifying the more central biliary ducts. A second access was then used for access into the biliary system, from intercostal location. This second needle was placed into the more central biliary ducts using fluoroscopic guidance with multiple obliquity. Once the tip of this needle was confirmed within the more central biliary system, an 018  wire was advanced centrally. The needle was removed, a small incision was made with an 11 blade scalpel, and then a triaxial Accustick system was advanced into the biliary system. The metal stiffener and dilator were removed, we confirmed placement with contrast infusion. A coaxial Glidewire and 4 French glide cath were then used to navigate across the obstruction at the ampulla into the duodenum. Once the catheter was within the duodenum, the wire was removed and contrast confirmed location. A coon's wire was advanced through the system, and the Accustick and Glidewire were removed. Dilation of the subcutaneous tissue tracks was performed with a 10 [place] dilator, then a 12 [place] dilator, and then a 93 [place] biliary drain was placed as an internal/external biliary drain. Small amount of contrast confirmed location. The  patient tolerated the procedure well and remained hemodynamically stable throughout. No complications were encountered and no significant blood loss was encountered. IMPRESSION: Status post ultrasound-guided subhepatic abscess drainage, as well as image guided percutaneous transhepatic cholangiogram with internal/external drainage. Signed, Yvone Neu. Miachel Roux, RPVI [facility] Radiology Electronically Signed   By: Gilmer Mor D.O.   On: 08/10/2023 15:41   IR BILIARY DRAIN PLACEMENT WITH CHOLANGIOGRAM Result Date: 08/10/2023 INDICATION: 73 year old male with a history of biloma and bile leak after prior partial cholecystectomy. He presents for drainage and internal/external biliary drain. EXAM: ULTRASOUND-GUIDED DRAINAGE OF SUBHEPATIC ABSCESS IMAGE GUIDED PERCUTANEOUS TRANSHEPATIC CHOLANGIOGRAM WITH INTERNAL/EXTERNAL BILIARY DRAIN. MEDICATIONS: None ANESTHESIA/SEDATION: Moderate (conscious) sedation was employed during this procedure. A total of Versed 2.0 mg and Fentanyl 100 mcg, 1 mg Dilaudid, was administered intravenously by the radiology nurse. Total intra-service moderate Sedation Time: 82 minutes. The patient's level of consciousness and vital signs were monitored continuously by radiology nursing throughout the procedure under my direct supervision. FLUOROSCOPY: Radiation Exposure Index (as provided by the fluoroscopic device): 387 mGy Kerma COMPLICATIONS: None PROCEDURE: The procedure, risks, benefits, and alternatives were explained to the patient and the patient's family. A complete informed consent was performed, with risk benefit analysis. Specific risks that were discussed for the procedure include bleeding, infection, biliary sepsis, IC use day, organ injury, need for further procedure, need for further surgery, long-term drain placement, cardiopulmonary collapse, death. Questions regarding the procedure were encouraged and answered. The  patient understands and consents to the procedure. Patient is position in supine position on the fluoroscopy table, and the upper abdomen was prepped and draped in the usual sterile fashion. Maximum barrier sterile technique with sterile gowns and gloves were used for the procedure. A timeout was performed prior to the initiation of the procedure. Local anesthesia was provided with 1% lidocaine with epinephrine. Ultrasound survey was performed of the hilum of the liver and the subhepatic liver in the right upper quadrant. Significant gas was present in the hilum of the liver, and differentiating what may be colon versus is a gas filled collection in this area was not possible. We elected to drain the sub a Paddock collection as this appears to communicate on the preoperative MRI. 1% lidocaine was used for local anesthesia. A trocar needle was advanced into the subhepatic fluid collection with ultrasound guidance. Modified Seldinger technique was used to place a 10 French pigtail catheter drain. Immediate return of similar fluid to the surgical drain. We then proceeded with PTC. Ultrasound survey of the left liver lobe was performed, with then ultrasound of the right liver lobe. 1% lidocaine was used for local anesthesia, with generous infiltration of the skin and  subcutaneous tissues in and inter left costal location. A Chiba needle was advanced under ultrasound guidance into the right liver lobe, using ultrasound guidance in attempt to access a biliary duct. We were successful in accessing portal vein, hepatic vein, and at 1 point distal hepatic artery. Given the small size of the biliary system/decompressed biliary system ultrasound was abandoned. We then proceeded with a formal PTC attempt. The access needle was advanced from a mid axillary line, eleventh rib into the a Paddock parenchyma. A traditional pullback contrast injection technique was used. It took 3 passes of the needle before distal biliary radicles  were opacified. Intermittent opacification of the biliary system using this first needle position was possible in opacifying the more central biliary ducts. A second access was then used for access into the biliary system, from intercostal location. This second needle was placed into the more central biliary ducts using fluoroscopic guidance with multiple obliquity. Once the tip of this needle was confirmed within the more central biliary system, an 018 wire was advanced centrally. The needle was removed, a small incision was made with an 11 blade scalpel, and then a triaxial Accustick system was advanced into the biliary system. The metal stiffener and dilator were removed, we confirmed placement with contrast infusion. A coaxial Glidewire and 4 French glide cath were then used to navigate across the obstruction at the ampulla into the duodenum. Once the catheter was within the duodenum, the wire was removed and contrast confirmed location. A coon's wire was advanced through the system, and the Accustick and Glidewire were removed. Dilation of the subcutaneous tissue tracks was performed with a 10 [place] dilator, then a 12 [place] dilator, and then a 44 [place] biliary drain was placed as an internal/external biliary drain. Small amount of contrast confirmed location. The patient tolerated the procedure well and remained hemodynamically stable throughout. No complications were encountered and no significant blood loss was encountered. IMPRESSION: Status post ultrasound-guided subhepatic abscess drainage, as well as image guided percutaneous transhepatic cholangiogram with internal/external drainage. Signed, Yvone Neu. Miachel Roux, RPVI [facility] Radiology Electronically Signed   By: Gilmer Mor D.O.   On: 08/10/2023 15:41   MR ABDOMEN MRCP W WO CONTAST Result Date: 08/08/2023 CLINICAL DATA:  Bile leak EXAM: MRI ABDOMEN WITHOUT AND WITH CONTRAST (INCLUDING  MRCP) TECHNIQUE: Multiplanar multisequence MR imaging of the abdomen was performed both before and after the administration of intravenous contrast. Heavily T2-weighted images of the biliary and pancreatic ducts were obtained, and three-dimensional MRCP images were rendered by post processing. CONTRAST:  10mL GADAVIST GADOBUTROL 1 MMOL/ML IV SOLN COMPARISON:  CT abdomen/pelvis dated 07/29/2023. FINDINGS: Motion degraded images. Lower chest: Small bilateral pleural effusions. Hepatobiliary: Liver is within normal limits. Status post subtotal cholecystectomy with residual gallbladder lumen and soft tissue debris in the surgical bed. Associated 9.2 x 6.1 x 10.8 cm fluid collection extending from the gallbladder fossa (series 6/image 31) to the inferior right perihepatic space (series 2/image 29). No intrahepatic or extrahepatic ductal dilatation. Common duct measures 5 mm, without discontinuity on MR. No choledocholithiasis is seen. Pancreas:  Within normal limits. Spleen:  Within normal limits. Adrenals/Urinary Tract:  Adrenal glands are within normal limits. Bilateral simple renal cysts, measuring up to 4.7 cm in the anterior left lower kidney (series 3/image 32), benign (Bosniak I). No follow-up is recommended. No hydronephrosis. Stomach/Bowel: Stomach is within normal limits. Visualized bowel is grossly unremarkable. Vascular/Lymphatic:  No evidence of aortic aneurysm. No suspicious abdominal  lymphadenopathy. Other:  No abdominal ascites. Musculoskeletal: No focal osseous lesions. IMPRESSION: Status post subtotal cholecystectomy with residual gallbladder lumen and soft tissue debris in the surgical bed. Associated 10.8 cm fluid collection extending from the gallbladder fossa to the inferior right perihepatic space. No intrahepatic or extrahepatic ductal dilatation. Common duct measures 5 mm, without discontinuity on MR. No choledocholithiasis is seen. Small bilateral pleural effusions. Electronically Signed   By:  Charline Bills M.D.   On: 08/08/2023 03:10   MR 3D Recon At Scanner Result Date: 08/08/2023 CLINICAL DATA:  Bile leak EXAM: MRI ABDOMEN WITHOUT AND WITH CONTRAST (INCLUDING MRCP) TECHNIQUE: Multiplanar multisequence MR imaging of the abdomen was performed both before and after the administration of intravenous contrast. Heavily T2-weighted images of the biliary and pancreatic ducts were obtained, and three-dimensional MRCP images were rendered by post processing. CONTRAST:  10mL GADAVIST GADOBUTROL 1 MMOL/ML IV SOLN COMPARISON:  CT abdomen/pelvis dated 07/29/2023. FINDINGS: Motion degraded images. Lower chest: Small bilateral pleural effusions. Hepatobiliary: Liver is within normal limits. Status post subtotal cholecystectomy with residual gallbladder lumen and soft tissue debris in the surgical bed. Associated 9.2 x 6.1 x 10.8 cm fluid collection extending from the gallbladder fossa (series 6/image 31) to the inferior right perihepatic space (series 2/image 29). No intrahepatic or extrahepatic ductal dilatation. Common duct measures 5 mm, without discontinuity on MR. No choledocholithiasis is seen. Pancreas:  Within normal limits. Spleen:  Within normal limits. Adrenals/Urinary Tract:  Adrenal glands are within normal limits. Bilateral simple renal cysts, measuring up to 4.7 cm in the anterior left lower kidney (series 3/image 32), benign (Bosniak I). No follow-up is recommended. No hydronephrosis. Stomach/Bowel: Stomach is within normal limits. Visualized bowel is grossly unremarkable. Vascular/Lymphatic:  No evidence of aortic aneurysm. No suspicious abdominal lymphadenopathy. Other:  No abdominal ascites. Musculoskeletal: No focal osseous lesions. IMPRESSION: Status post subtotal cholecystectomy with residual gallbladder lumen and soft tissue debris in the surgical bed. Associated 10.8 cm fluid collection extending from the gallbladder fossa to the inferior right perihepatic space. No intrahepatic or  extrahepatic ductal dilatation. Common duct measures 5 mm, without discontinuity on MR. No choledocholithiasis is seen. Small bilateral pleural effusions. Electronically Signed   By: Charline Bills M.D.   On: 08/08/2023 03:10   DG CHEST PORT 1 VIEW Result Date: 08/03/2023 CLINICAL DATA:  PICC in place. EXAM: PORTABLE CHEST 1 VIEW COMPARISON:  3 hours prior FINDINGS: Tip of the right upper extremity PICC in the region of the atrial caval junction. Lower lung volumes from earlier today. Stable mediastinal contours, aortic atherosclerosis. Increasing atelectasis in the left lung base and right infrahilar region. No pneumothorax or large pleural effusion. IMPRESSION: 1. Tip of the right upper extremity PICC in the region of the atrial caval junction. 2. Lower lung volumes with increasing atelectasis in the left lung base and right infrahilar region. Electronically Signed   By: Narda Rutherford M.D.   On: 08/03/2023 20:46   DG Chest Port 1 View Result Date: 08/03/2023 CLINICAL DATA:  Line placement EXAM: PORTABLE CHEST 1 VIEW COMPARISON:  X-ray 07/28/2023.  CT 07/28/2023. FINDINGS: Placement of a right-sided PICC with the tip along the upper right atrium. Stable cardiopericardial silhouette when adjusting for level of underinflation. Calcified aorta. Increasing bandlike changes right perihilar in the left lung base, favoring atelectasis. No pneumothorax, effusion or edema. Overlapping cardiac leads. The left inferior costophrenic angle is clipped off the edge of the film. Degenerative changes along the spine. IMPRESSION: Right-sided PICC in place with tip  along the upper right atrium. Decreased inflation with some increasing atelectasis. Electronically Signed   By: Karen Kays M.D.   On: 08/03/2023 18:04   [place] EKG SITE RITE Result Date: 08/03/2023 If Site Rite image not attached, placement could not be confirmed due to current cardiac rhythm.  LONG TERM MONITOR (3-14 DAYS) Result Date: 08/01/2023   Atrial  Fibrillation occurred continuously (100% burden), ranging from 53-193 bpm (avg of 94 bpm). Patch Wear Time:  13 days and 13 hours (2024-12-17T13:14:02-499 to 2024-12-31T02:25:18-0500) 2 Ventricular Tachycardia runs occurred, the run with the fastest interval lasting 4 beats with a max rate of 164 bpm, the longest lasting 11.4 secs with an avg rate of 132 bpm. Atrial Fibrillation occurred continuously (100% burden), ranging from 53-193  bpm (avg of 94 bpm). Isolated VEs were rare (<1.0%, 2515), VE Couplets were rare (<1.0%, 67), and VE Triplets were rare (<1.0%, 5). Ventricular Bigeminy was present.   ECHOCARDIOGRAM COMPLETE Result Date: 07/31/2023    ECHOCARDIOGRAM REPORT   Patient Name:   IZEAR PINE Date of Exam: 07/31/2023 Medical Rec #:  [id number]     Height:       76.0 in Accession #:    [id number]    Weight:       205.0 lb Date of Birth:  1951-03-26     BSA:          2.239 m Patient Age:    72 years      BP:           96/74 mmHg Patient Gender: M             HR:           100 bpm. Exam Location:  Inpatient Procedure: 2D Echo, Cardiac Doppler, Color Doppler and Intracardiac            Opacification Agent Indications:    Preoperative Evaluation  History:        Patient has prior history of Echocardiogram examinations, most                 recent 09/11/2014. CAD, Aortic Valve Disease, Arrythmias:Atrial                 Fibrillation; Signs/Symptoms:Hypotension.  Sonographer:    Webb Laws Referring Phys: [id number] SHENG L HALEY  Sonographer Comments: Suboptimal parasternal window and suboptimal apical window. IMPRESSIONS  1. The aortic valve is severely calcified in views obtained. Vmax 2.9 m/s, MG 20 mmHG, AVA 0.96 cm2, DI 0.21. LV SVI low 22 cc/m2. 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 If there are clinical concerns for severe aortic stenosis, would recommend an aortic valve calcium score for clarification. The aortic valve is calcified. Aortic valve  regurgitation is not visualized. Moderate aortic valve stenosis. Aortic valve area, by VTI measures 0.96 cm. Aortic valve mean gradient measures 20.3 mmHg. Aortic valve Vmax measures 2.93 m/s.  2. Left ventricular ejection fraction, by estimation, is 60 to 65%. The left ventricle has normal function. The left ventricle has no regional wall motion abnormalities. Indeterminate diastolic filling due to E-A fusion.  3. Right ventricular systolic function is mildly reduced. The right ventricular size is mildly enlarged. There is normal pulmonary artery systolic pressure. The estimated right ventricular systolic pressure is 30.2 mmHg.  4. Left atrial size was moderately dilated.  5. Right atrial size was mildly dilated.  6. The mitral valve is grossly normal. Trivial mitral valve regurgitation. No evidence of mitral  stenosis.  7. The inferior vena cava is normal in size with greater than 50% respiratory variability, suggesting right atrial pressure of 3 mmHg. FINDINGS  Left Ventricle: Left ventricular ejection fraction, by estimation, is 60 to 65%. The left ventricle has normal function. The left ventricle has no regional wall motion abnormalities. Definity contrast agent was given IV to delineate the left ventricular  endocardial borders. The left ventricular internal cavity size was normal in size. There is no left ventricular hypertrophy. Indeterminate diastolic filling due to E-A fusion. Right Ventricle: The right ventricular size is mildly enlarged. No increase in right ventricular wall thickness. Right ventricular systolic function is mildly reduced. There is normal pulmonary artery systolic pressure. The tricuspid regurgitant velocity  is 1.95 m/s, and with an assumed right atrial pressure of 15 mmHg, the estimated right ventricular systolic pressure is 30.2 mmHg. Left Atrium: Left atrial size was moderately dilated. Right Atrium: Right atrial size was mildly dilated. Pericardium: There is no evidence of  pericardial effusion. Mitral Valve: The mitral valve is grossly normal. Trivial mitral valve regurgitation. No evidence of mitral valve stenosis. Tricuspid Valve: The tricuspid valve is grossly normal. Tricuspid valve regurgitation is trivial. No evidence of tricuspid stenosis. Aortic Valve: The aortic valve is severely calcified in views obtained. Vmax 2.9 m/s, MG 20 mmHG, AVA 0.96 cm2, DI 0.21. LV SVI low 22 cc/m2. 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 If there are clinical concerns for severe aortic stenosis, would recommend an aortic valve calcium score for clarification. The aortic valve is calcified. Aortic valve regurgitation is not visualized. Moderate aortic stenosis is present. Aortic valve mean gradient measures 20.3 mmHg. Aortic valve peak gradient measures 34.3 mmHg. Aortic valve area, by VTI measures 0.96 cm. Pulmonic Valve: The pulmonic valve was grossly normal. Pulmonic valve regurgitation is not visualized. No evidence of pulmonic stenosis. Aorta: The aortic root and ascending aorta are structurally normal, with no evidence of dilitation. Venous: The inferior vena cava is normal in size with greater than 50% respiratory variability, suggesting right atrial pressure of 3 mmHg. IAS/Shunts: The atrial septum is grossly normal.  LEFT VENTRICLE PLAX 2D LVIDd:         4.70 cm      Diastology LVIDs:         3.30 cm      LV e' medial:    10.40 cm/s LV PW:         1.00 cm      LV E/e' medial:  5.8 LV IVS:        0.90 cm      LV e' lateral:   14.60 cm/s LVOT diam:     2.40 cm      LV E/e' lateral: 4.1 LV SV:         50 LV SV Index:   22 LVOT Area:     4.52 cm  LV Volumes (MOD) LV vol d, MOD A2C: 61.7 ml LV vol d, MOD A4C: 113.0 ml LV vol s, MOD A2C: 29.6 ml LV vol s, MOD A4C: 45.4 ml LV SV MOD A2C:     32.1 ml LV SV MOD A4C:     113.0 ml LV SV MOD BP:      51.5 ml RIGHT VENTRICLE            IVC RV Basal diam:  4.20 cm    IVC diam: 3.20 cm RV Mid diam:    3.40  cm RV S  prime:     9.48 cm/s TAPSE (M-mode): 1.7 cm LEFT ATRIUM            Index        RIGHT ATRIUM           Index LA diam:      5.60 cm  2.50 cm/m   RA Area:     17.50 cm LA Vol (A4C): 115.0 ml 51.37 ml/m  RA Volume:   41.50 ml  18.54 ml/m  AORTIC VALVE AV Area (Vmax):    0.99 cm AV Area (Vmean):   0.98 cm AV Area (VTI):     0.96 cm AV Vmax:           292.98 cm/s AV Vmean:          200.557 cm/s AV VTI:            0.526 m AV Peak Grad:      34.3 mmHg AV Mean Grad:      20.3 mmHg LVOT Vmax:         64.30 cm/s LVOT Vmean:        43.400 cm/s LVOT VTI:          0.111 m LVOT/AV VTI ratio: 0.21  AORTA Ao Asc diam: 3.50 cm MITRAL VALVE               TRICUSPID VALVE MV Area (PHT): 6.32 cm    TR Peak grad:   15.2 mmHg MV Decel Time: 120 msec    TR Vmax:        195.00 cm/s MV E velocity: 60.20 cm/s MV A velocity: 40.30 cm/s  SHUNTS MV E/A ratio:  1.49        Systemic VTI:  0.11 m                            Systemic Diam: 2.40 cm Lennie Odor MD Electronically signed by Lennie Odor MD Signature Date/Time: 07/31/2023/10:21:24 AM    Final    CT Chest W Contrast Result Date: 07/29/2023 CLINICAL DATA:  Sepsis, weakness, fatigue. EXAM: CT CHEST WITH CONTRAST TECHNIQUE: Multidetector CT imaging of the chest was performed during intravenous contrast administration. RADIATION DOSE REDUCTION: This exam was performed according to the departmental dose-optimization program which includes automated exposure control, adjustment of the mA and/or kV according to patient size and/or use of iterative reconstruction technique. CONTRAST:  75mL OMNIPAQUE IOHEXOL 350 MG/ML SOLN COMPARISON:  01/20/2011 FINDINGS: Cardiovascular: Heart is normal size. Aorta is normal caliber. Diffuse 3 vessel coronary artery disease. Aortic atherosclerosis. Mediastinum/Nodes: No mediastinal, hilar, or axillary adenopathy. Trachea and esophagus are unremarkable. Thyroid unremarkable. Lungs/Pleura: Lungs are clear. No focal airspace opacities or suspicious  nodules. No effusions. Upper Abdomen: See abdominal CT report Musculoskeletal: Chest wall soft tissues are unremarkable. No acute bony abnormality. IMPRESSION: No acute cardiopulmonary disease. Three vessel coronary artery disease. Aortic Atherosclerosis (ICD10-I70.0). Electronically Signed   By: Charlett Nose M.D.   On: 07/29/2023 00:15   CT Head Wo Contrast Result Date: 07/29/2023 CLINICAL DATA:  Memory loss EXAM: CT HEAD WITHOUT CONTRAST TECHNIQUE: Contiguous axial images were obtained from the base of the skull through the vertex without intravenous contrast. RADIATION DOSE REDUCTION: This exam was performed according to the departmental dose-optimization program which includes automated exposure control, adjustment of the mA and/or kV according to patient size and/or use of iterative reconstruction technique. COMPARISON:  12/07/2022 FINDINGS: Brain: There is atrophy and chronic small  vessel disease changes. No acute intracranial abnormality. Specifically, no hemorrhage, hydrocephalus, mass lesion, acute infarction, or significant intracranial injury. Vascular: No hyperdense vessel or unexpected calcification. Skull: No acute calvarial abnormality. Sinuses/Orbits: No acute findings Other: None IMPRESSION: Atrophy, chronic microvascular disease. No acute intracranial abnormality. Electronically Signed   By: Charlett Nose M.D.   On: 07/29/2023 00:14   CT ABDOMEN PELVIS W CONTRAST Result Date: 07/29/2023 CLINICAL DATA:  Weakness, fatigue.  Abdominal pain. EXAM: CT ABDOMEN AND PELVIS WITH CONTRAST TECHNIQUE: Multidetector CT imaging of the abdomen and pelvis was performed using the standard protocol following bolus administration of intravenous contrast. RADIATION DOSE REDUCTION: This exam was performed according to the departmental dose-optimization program which includes automated exposure control, adjustment of the mA and/or kV according to patient size and/or use of iterative reconstruction technique.  CONTRAST:  75mL OMNIPAQUE IOHEXOL 350 MG/ML SOLN COMPARISON:  06/06/2021 FINDINGS: Lower chest: No acute abnormality. Hepatobiliary: Multiple layering gallstones within the gallbladder. Gallbladder is distended with gallbladder wall thickening. Findings concerning for acute cholecystitis. No focal hepatic abnormality. Mild periportal edema. No biliary ductal dilatation. Pancreas: Atrophy of the pancreas. No focal abnormality or ductal dilatation. Spleen: No focal abnormality.  Normal size. Adrenals/Urinary Tract: Adrenal glands normal. Numerous bilateral nonobstructing renal stones. Bilateral renal cysts are similar prior study. No follow-up imaging recommended. No definite ureteral stones. In the left pelvis posterior to the bladder in the region of the distal left ureter. The ureter is decompressed and difficult to follows/visualized. Given the decompressed state, I favor this is likely an adjacent phlebolith. Stomach/Bowel: Prior Roux-en-Y gastric bypass. Stomach, large and small bowel grossly unremarkable. Vascular/Lymphatic: Aortoiliac atherosclerosis. No evidence of aneurysm or adenopathy. Reproductive: No visible focal abnormality. Other: No free fluid or free air. Musculoskeletal: No acute bony abnormality. IMPRESSION: Cholelithiasis. Gallbladder distension with gallbladder wall thickening and surrounding inflammation concerning for acute cholecystitis. Periportal edema can be seen with volume overload/resuscitation or inflammatory processes such as hepatitis. Bilateral nephrolithiasis.  No hydronephrosis. Prior gastric bypass.  No visible complicating feature. Aortoiliac atherosclerosis. Electronically Signed   By: Charlett Nose M.D.   On: 07/29/2023 00:13   DG Ribs Unilateral W/Chest Right Result Date: 07/28/2023 CLINICAL DATA:  Recent fall with right rib pain, initial encounter EXAM: RIGHT RIBS AND CHEST - 3+ VIEW COMPARISON:  07/31/2022 FINDINGS: Cardiac shadow is within normal limits. Aortic  calcifications are noted. The lungs are clear bilaterally. No infiltrate or pneumothorax is seen. No acute rib abnormality noted. IMPRESSION: No acute rib fracture seen. Electronically Signed   By: Alcide Clever M.D.   On: 07/28/2023 23:39       The results of significant diagnostics from this hospitalization (including imaging, microbiology, ancillary and laboratory) are listed below for reference.     Microbiology: No results found for this or any previous visit (from the past 240 hours).   Labs:  CBC: Recent Labs  Lab 08/20/23 1201 08/21/23 0505 08/22/23 0355 08/23/23 0500 08/24/23 0050  WBC 13.7* 13.6* 11.7* 11.2* 12.2*  NEUTROABS  --   --  9.4*  --   --   HGB 9.1* 8.5* 8.7* 8.9* 8.4*  HCT 27.7* 25.9* 25.9* 26.5* 25.4*  MCV 92.0 90.9 90.2 90.4 89.1  PLT 311 316 353 407* 447*   BMP &GFR Recent Labs  Lab 08/20/23 1201 08/21/23 0505 08/22/23 0355 08/23/23 0500 08/24/23 0050  NA 134* 134* 132* 132* 132*  K 4.1 4.1 4.1 4.4 4.2  CL 100 101 96* 98 96*  CO2 26 27 25  27 27  GLUCOSE 113* 102* 92 88 111*  BUN 19 15 23 23 21   CREATININE 0.66 0.57* 0.60* 0.60* 0.54*  CALCIUM 8.5* 8.6* 8.4* 8.3* 8.3*  MG  --   --  1.9 1.8  --    Estimated Creatinine Clearance: 104 mL/min (A) (by C-G formula based on SCr of 0.54 mg/dL (L)). Liver & Pancreas: Recent Labs  Lab 08/20/23 1201 08/21/23 0505 08/22/23 0355 08/23/23 0500 08/24/23 0050  AST 18 16 19 17 16   ALT 35 27 25 21 20   ALKPHOS 264* 209* 209* 183* 174*  BILITOT 1.1 1.0 0.8 0.9 0.7  PROT 5.8* 5.7* 5.6* 5.5* 5.7*  ALBUMIN 1.8* 1.8* 1.8* 1.9* 1.9*   No results for input(s): "LIPASE", "AMYLASE" in the last 168 hours. No results for input(s): "AMMONIA" in the last 168 hours. Diabetic: No results for input(s): "HGBA1C" in the last 72 hours. No results for input(s): "GLUCAP" in the last 168 hours. Cardiac Enzymes: No results for input(s): "CKTOTAL", "CKMB", "CKMBINDEX", "TROPONINI" in the last 168 hours. No results for  input(s): "PROBNP" in the last 8760 hours. Coagulation Profile: No results for input(s): "INR", "PROTIME" in the last 168 hours. Thyroid Function Tests: No results for input(s): "TSH", "T4TOTAL", "FREET4", "T3FREE", "THYROIDAB" in the last 72 hours. Lipid Profile: No results for input(s): "CHOL", "HDL", "LDLCALC", "TRIG", "CHOLHDL", "LDLDIRECT" in the last 72 hours. Anemia Panel: No results for input(s): "VITAMINB12", "FOLATE", "FERRITIN", "TIBC", "IRON", "RETICCTPCT" in the last 72 hours. Urine analysis:    Component Value Date/Time   COLORURINE AMBER BIOCHEMICALS MAY BE AFFECTED BY COLOR (A) 10/21/2009 1928   APPEARANCEUR CLEAR 10/21/2009 1928   LABSPEC 1.024 10/21/2009 1928   PHURINE 5.5 10/21/2009 1928   GLUCOSEU NEGATIVE 10/21/2009 1928   HGBUR NEGATIVE 10/21/2009 1928   BILIRUBINUR SMALL (A) 10/21/2009 1928   KETONESUR 15 (A) 10/21/2009 1928   PROTEINUR 30 (A) 10/21/2009 1928   UROBILINOGEN 1.0 10/21/2009 1928   NITRITE NEGATIVE 10/21/2009 1928   LEUKOCYTESUR TRACE (A) 10/21/2009 1928   Sepsis Labs: Invalid input(s): "PROCALCITONIN", "LACTICIDVEN"   SIGNED:  Almon Hercules, MD  [facility] 08/24/2023, 12:37 PM

## 2023-08-23 NOTE — Progress Notes (Signed)
Mobility Specialist Progress Note:   08/23/23 1400  Mobility  Activity Transferred from bed to chair  Level of Assistance Moderate assist, patient does 50-74%  Assistive Device Front wheel walker  Distance Ambulated (ft) 3 ft  Activity Response Tolerated well  Mobility Referral Yes  Mobility visit 1 Mobility  Mobility Specialist Start Time (ACUTE ONLY) 1400  Mobility Specialist Stop Time (ACUTE ONLY) 1420  Mobility Specialist Time Calculation (min) (ACUTE ONLY) 20 min   Pt agreeable to mobility session with encouragement. Required minA to get EOB, minA to stand. Once standing, pt with heavy posterior lean, requiring modA and max cues to correct. Pt able to pivot to chair. C/o sharp abdominal pain, otherwise asx. Pt left in chair with all needs met, alarm on.   Addison Lank Mobility Specialist Please contact via SecureChat or  Rehab office at [phone]

## 2023-08-23 NOTE — Plan of Care (Signed)
  Problem: Health Behavior/Discharge Planning: Goal: Ability to manage health-related needs will improve Outcome: Progressing   Problem: Clinical Measurements: Goal: Ability to maintain clinical measurements within normal limits will improve Outcome: Progressing   Problem: Activity: Goal: Risk for activity intolerance will decrease Outcome: Progressing   Problem: Coping: Goal: Level of anxiety will decrease Outcome: Progressing   Problem: Pain Management: Goal: General experience of comfort will improve Outcome: Progressing   Problem: Safety: Goal: Ability to remain free from injury will improve Outcome: Progressing   Problem: Skin Integrity: Goal: Risk for impaired skin integrity will decrease Outcome: Progressing

## 2023-08-23 NOTE — TOC Progression Note (Signed)
Transition of Care [facility]) - Progression Note    Patient Details  Name: Francisco Padilla MRN: [id number] Date of Birth: 1951-02-10  Transition of Care [facility]) CM/SW Contact  Renie Ora Phone Number: 08/23/2023, 4:08 PM  Clinical Narrative:    [facility]) Detailed Notice of Discharge letter created and saved: Yes Gilda Crease) Detailed Notice of Discharge Document Given to Pateint: Yes Gilda Crease) [facility]) ROI Document Created: Yes Gilda Crease) [facility]) appeal documents uploaded to [facility]) site: Yes Sloan Leiter)

## 2023-08-23 NOTE — TOC Progression Note (Addendum)
Transition of Care [facility]) - Progression Note    Patient Details  Name: Francisco Padilla MRN: [id number] Date of Birth: [birth year]  Transition of Care [facility]) CM/SW Contact  Michaela Corner, [place] Phone Number: 08/23/2023, 10:34 AM  Clinical Narrative:   CSW spoke with pt regarding discharge. Pt states he is not going anywhere today as he does not feel ready. Pt states he has pain and agrees to go to SNF but at the "appropiate time". CSW explained to pt that it is his right to appeal his DC as a medicare pt and to call the appeal number on the IM notice. CSW further explained to pt that if appeal is denied that he will receive a bill, pt gave his verbal understanding by saying "that's okay". CSW left a VM for Heather at [facility] to check on bed availability over the weekend. CSW informed pt that will check on bed availability at SNF. CSW/RNCM notifed MD and RN of pt appeal.   11:31AM: Per Heather at [facility], they do handle weekend admissions, they would need contact information that can be passed to weekend supervisors. All dc summaries must be to them by 11am and have to be emailed.  2:40 PM: Per Herbert Seta, will need dc summary dated/signed for day of dc. Will need to notify MD to updated date on dc summary closer to discharge.  TOC will continue to follow.    Expected Discharge Plan: Skilled Nursing Facility Barriers to Discharge: Continued Medical Work up  Expected Discharge Plan and Services In-house Referral: NA Discharge Planning Services: CM Consult Post Acute Care Choice: NA Living arrangements for the past 2 months: Single Family Home Expected Discharge Date: 08/23/23               DME Arranged: N/A DME Agency: NA       HH Arranged: NA           Social Determinants of Health (SDOH) Interventions SDOH Screenings   Food Insecurity: No Food Insecurity (07/29/2023)  Housing: Low Risk  (07/29/2023)  Transportation Needs: No Transportation Needs (07/29/2023)  Utilities:  Not At Risk (07/29/2023)  Social Connections: Patient Declined (07/31/2023)  Tobacco Use: Low Risk  (08/01/2023)    Readmission Risk Interventions     No data to display

## 2023-08-23 NOTE — Care Management Important Message (Signed)
Important Message  Patient Details  Name: Francisco Padilla MRN: [id number] Date of Birth: 1950/11/05   Important Message Given:  Yes - Medicare IM     Renie Ora 08/23/2023, 10:33 AM

## 2023-08-23 NOTE — TOC Progression Note (Addendum)
Transition of Care [facility]) - Progression Note    Patient Details  Name: Francisco Padilla MRN: [id number] Date of Birth: 1950/11/10  Transition of Care [facility]) CM/SW Contact  Lockie Pares, RN Phone Number: 08/23/2023, 12:54 PM  Clinical Narrative:     Patient requesting help with medicare call. He is on the phone currently speaking to Medicare. They gave him another number to call. He stated he was told lots of different things, he thought he was going to [facility].Then he siad something about SNF and if he could appeal through Medicaid.Discussed that he is the one to make the call for appeal. He is following the conversation and  is properly conversant alert oriented. Made a timeline of events admission, surgery etc so the patient can refer back to this if he needs. He states he is eating lunch and the  is calling Medicare 1400 Recived notification that the patient has appealed discharge. [facility] sent paperwork over indicating that case id is [id number] however the second sheet from [facility] indicates that "this is to inform you that thrre is no planned discharge for this patient"  Called [facility] at [phone] they state that the second page is a formality  1500 DND and HINN 12 verbally explained at bedside to patient, asked questions.  Sloan Leiter notified of intent to appeal, she is faxing over information to [place] Expected Discharge Plan: Skilled Nursing Facility Barriers to Discharge: Continued Medical Work up  Expected Discharge Plan and Services In-house Referral: NA Discharge Planning Services: CM Consult Post Acute Care Choice: NA Living arrangements for the past 2 months: Single Family Home Expected Discharge Date: 08/23/23               DME Arranged: N/A DME Agency: NA       HH Arranged: NA           Social Determinants of Health (SDOH) Interventions SDOH Screenings   Food Insecurity: No Food Insecurity (07/29/2023)  Housing: Low Risk  (07/29/2023)  Transportation  Needs: No Transportation Needs (07/29/2023)  Utilities: Not At Risk (07/29/2023)  Social Connections: Patient Declined (07/31/2023)  Tobacco Use: Low Risk  (08/01/2023)    Readmission Risk Interventions     No data to display

## 2023-08-23 NOTE — Consult Note (Signed)
[facility] Liaison Consult Note   08/23/2023  Francisco Padilla 1950-11-25 [id number]  Follow up:  Rising high risk for potential transition with LLOS of 25 days and saw potential for discharge  Spoke with patient via phone, HIPAA verified, to check for post hospital follow up needs.  Patient states he was told of discharge today to rehab but doesn't feel ready.  Explained reason for follow up and endorses PCP, at [facility].  Explained if he goes to a facility [facility] RN follows then can have this nurse check with facility.  Patient states he has been in "works" with going to [facility] [not currently an affiliated facility with [facility]]. However, patient states he doesn't feel ready to transition and that he was told he could call Medicare about his case concerns.  Patient spoke at length about potential needs for returning to community after rehab ie, applying for Medicaid, issues with HVAC in his home. He expressed ongoing needs when returning to community. Encouraged him to contact provider for [facility]I follow up, if going to a non-affiliated facility.  Patient was generous and states he understands. He states he feel pretty clear today with information we talked about.  Plan: Continue to follow  For questions, please contact:  Charlesetta Shanks, RN, BSN, CCM [place]  [facility] Liaison Direct Dial: [phone] or secure chat Email: [name]

## 2023-08-24 LAB — COMPREHENSIVE METABOLIC PANEL
ALT: 20 U/L (ref 0–44)
AST: 16 U/L (ref 15–41)
Albumin: 1.9 g/dL — ABNORMAL LOW (ref 3.5–5.0)
Alkaline Phosphatase: 174 U/L — ABNORMAL HIGH (ref 38–126)
Anion gap: 9 (ref 5–15)
BUN: 21 mg/dL (ref 8–23)
CO2: 27 mmol/L (ref 22–32)
Calcium: 8.3 mg/dL — ABNORMAL LOW (ref 8.9–10.3)
Chloride: 96 mmol/L — ABNORMAL LOW (ref 98–111)
Creatinine, Ser: 0.54 mg/dL — ABNORMAL LOW (ref 0.61–1.24)
GFR, Estimated: >60 mL/min (ref >60)
Glucose, Bld: 111 mg/dL — ABNORMAL HIGH (ref 70–99)
Potassium: 4.2 mmol/L (ref 3.5–5.1)
Sodium: 132 mmol/L — ABNORMAL LOW (ref 135–145)
Total Bilirubin: 0.7 mg/dL (ref 0.0–1.2)
Total Protein: 5.7 g/dL — ABNORMAL LOW (ref 6.5–8.1)

## 2023-08-24 LAB — CBC
HCT: 25.4 % — ABNORMAL LOW (ref 39.0–52.0)
Hemoglobin: 8.4 g/dL — ABNORMAL LOW (ref 13.0–17.0)
MCH: 29.5 pg (ref 26.0–34.0)
MCHC: 33.1 g/dL (ref 30.0–36.0)
MCV: 89.1 fL (ref 80.0–100.0)
Platelets: 447 10*3/uL — ABNORMAL HIGH (ref 150–400)
RBC: 2.85 MIL/uL — ABNORMAL LOW (ref 4.22–5.81)
RDW: 14.1 % (ref 11.5–15.5)
WBC: 12.2 10*3/uL — ABNORMAL HIGH (ref 4.0–10.5)
nRBC: 0 % (ref 0.0–0.2)

## 2023-08-24 NOTE — Progress Notes (Addendum)
Seen and examined earlier this morning.   Patient was discharged to SNF for 08/23/2023 but appealed discharge and stayed overnight. No major events overnight of this morning.  Discharge order and summary from 08/23/2023 but addended changes to medications per recommendation by pharmacy.

## 2023-08-24 NOTE — TOC Progression Note (Signed)
Transition of Care (TOC) - Progression Note   Received call from Francisco Padilla with TOC . [facility] / Francisco Padilla Appeal called. Patient has lost his discharge appeal . His stay will be covered until noon tomorrow. They called patient however he did not answer so they left a voicemail.     NCM explained above to patient at bedside.   Patient voiced understanding , but stated he is to weak to go. NCM explained ambulance will transport and he is going there for rehab. PAtient wanting to know how long he would be there. NCM explained depends on how he progresses and [place] would keep insurance updated . Patient does not want to go today.   Spoke to [place] at [facility] . They can admit tomorrow. They will need discharge summary with tomorrow's date , emailed to them tomorrow by 1100 to [email or web address] or [email or web address]  Number to call report is [phone]  Francisco Padilla's phone number [phone]   NCM told patient [place] can admit tomorrow morning. Patient has multiple questions regarding medications he will receive . NCM read discharge summary to patient. Patient continues to have questions. NCM secure chatted MD. MD will discuss with patient.   Patient currently listening to voicemail from [facility]/ [place] Appeal  Patient Details  Name: Francisco Padilla MRN: [id number] Date of Birth: 15-Sep-1950  Transition of Care [facility]) CM/SW Contact  Francisco Padilla, Francisco Devon, RN Phone Number: 08/24/2023, 3:35 PM  Clinical Narrative:       Expected Discharge Plan: Skilled Nursing Facility Barriers to Discharge: Continued Medical Work up  Expected Discharge Plan and Services In-house Referral: NA Discharge Planning Services: CM Consult Post Acute Care Choice: NA Living arrangements for the past 2 months: Single Family Home Expected Discharge Date: 08/23/23               DME Arranged: N/A DME Agency: NA       HH Arranged: NA           Social Determinants of Health (SDOH)  Interventions SDOH Screenings   Food Insecurity: No Food Insecurity (07/29/2023)  Housing: Low Risk  (07/29/2023)  Transportation Needs: No Transportation Needs (07/29/2023)  Utilities: Not At Risk (07/29/2023)  Social Connections: Patient Declined (07/31/2023)  Tobacco Use: Low Risk  (08/01/2023)    Readmission Risk Interventions     No data to display

## 2023-08-24 NOTE — Progress Notes (Addendum)
23 Days Post-Op  Subjective: CC: Pain similar to when my colleague saw him the other day from what I gather. Possibly slightly worse in the central abdomen with stable RUQ pain. Tolerating diet without worsening pain, n/v. BM 1/29. Voiding without issues.   Afebrile. Tachycardia resolved. No hypotension - last BP 115/76. WBC 12.2. LFT's downtrending.   Objective: Vital signs in last 24 hours: Temp:  [97.3 F (36.3 C)-98.3 F (36.8 C)] 98.2 F (36.8 C) (01/31 0631) Pulse Rate:  [89-104] 96 (01/31 0805) Resp:  [16-20] 16 (01/31 0805) BP: (93-115)/(68-82) 115/76 (01/31 0805) SpO2:  [98 %-100 %] 100 % (01/31 0805) Last BM Date : 08/22/23  Intake/Output from previous day: 01/30 0701 - 01/31 0700 In: -  Out: 1525 [Urine:1500; Drains:25] Intake/Output this shift: No intake/output data recorded.  PE: Gen: NAD Abd: soft, incisions c/d/I, both perc drains purulent output, PTC capped   Lab Results:  Recent Labs    08/23/23 0500 08/24/23 0050  WBC 11.2* 12.2*  HGB 8.9* 8.4*  HCT 26.5* 25.4*  PLT 407* 447*   BMET Recent Labs    08/23/23 0500 08/24/23 0050  NA 132* 132*  K 4.4 4.2  CL 98 96*  CO2 27 27  GLUCOSE 88 111*  BUN 23 21  CREATININE 0.60* 0.54*  CALCIUM 8.3* 8.3*   PT/INR No results for input(s): "LABPROT", "INR" in the last 72 hours. CMP     Component Value Date/Time   NA 132 (L) 08/24/2023 0050   K 4.2 08/24/2023 0050   CL 96 (L) 08/24/2023 0050   CO2 27 08/24/2023 0050   GLUCOSE 111 (H) 08/24/2023 0050   BUN 21 08/24/2023 0050   CREATININE 0.54 (L) 08/24/2023 0050   CREATININE 0.77 07/19/2023 1128   CALCIUM 8.3 (L) 08/24/2023 0050   PROT 5.7 (L) 08/24/2023 0050   ALBUMIN 1.9 (L) 08/24/2023 0050   AST 16 08/24/2023 0050   ALT 20 08/24/2023 0050   ALKPHOS 174 (H) 08/24/2023 0050   BILITOT 0.7 08/24/2023 0050   GFRNONAA >60 08/24/2023 0050   GFRAA  10/23/2009 0135    >60        The eGFR has been calculated using the MDRD equation. This  calculation has not been validated in all clinical situations. eGFR's persistently <60 mL/min signify possible Chronic Kidney Disease.   Lipase     Component Value Date/Time   LIPASE 29 07/29/2023 0014    Studies/Results: No results found.  Anti-infectives: Anti-infectives (From admission, onward)    Start     Dose/Rate Route Frequency Ordered Stop   08/23/23 0000  metroNIDAZOLE (FLAGYL) 500 MG tablet        500 mg Oral 2 times daily 08/23/23 0710 09/20/23 2359   08/23/23 0000  ciprofloxacin (CIPRO) 500 MG tablet        500 mg Oral 2 times daily 08/23/23 0710 09/20/23 2359   08/21/23 2200  cefTRIAXone (ROCEPHIN) 2 g in sodium chloride 0.9 % 100 mL IVPB        2 g 200 mL/hr over 30 Minutes Intravenous Every 24 hours 08/21/23 1551     08/21/23 2200  metroNIDAZOLE (FLAGYL) tablet 500 mg        500 mg Oral Every 12 hours 08/21/23 1551     08/15/23 1615  cefTRIAXone (ROCEPHIN) 2 g in sodium chloride 0.9 % 100 mL IVPB        2 g 200 mL/hr over 30 Minutes Intravenous To Radiology 08/15/23 1525 08/15/23  1643   08/13/23 0800  meropenem (MERREM) 1 g in sodium chloride 0.9 % 100 mL IVPB  Status:  Discontinued        1 g 200 mL/hr over 30 Minutes Intravenous Every 8 hours 08/13/23 0714 08/21/23 1551   08/09/23 1400  cefTRIAXone (ROCEPHIN) 2 g in sodium chloride 0.9 % 100 mL IVPB  Status:  Discontinued        2 g 200 mL/hr over 30 Minutes Intravenous To Radiology 08/09/23 1254 08/10/23 0716   08/09/23 1245  cefTRIAXone (ROCEPHIN) 2 g in sodium chloride 0.9 % 100 mL IVPB  Status:  Discontinued       Note to Pharmacy: Please do not give on floor. To be given in IR.   2 g 200 mL/hr over 30 Minutes Intravenous To Radiology 08/09/23 1152 08/09/23 1213   08/07/23 1100  piperacillin-tazobactam (ZOSYN) IVPB 3.375 g  Status:  Discontinued        3.375 g 12.5 mL/hr over 240 Minutes Intravenous Every 8 hours 08/07/23 1008 08/13/23 0714   08/07/23 0000  piperacillin-tazobactam (ZOSYN) 3.375  GM/50ML IVPB  Status:  Discontinued        3.375 g Intravenous Every 8 hours 08/07/23 1501 08/23/23    07/29/23 0800  piperacillin-tazobactam (ZOSYN) IVPB 3.375 g        3.375 g 12.5 mL/hr over 240 Minutes Intravenous Every 8 hours 07/29/23 0207 08/06/23 2359   07/29/23 0030  piperacillin-tazobactam (ZOSYN) IVPB 3.375 g        3.375 g 100 mL/hr over 30 Minutes Intravenous  Once 07/29/23 0020 07/29/23 0147        Assessment/Plan S/p subtotal cholecystectomy for gangrenous cholecystitis, Dr. Hillery Hunter 08/01/23 - Regular diet, tolerating and having bowel function - Cont abx therapy per ID recs - Do not uncap PTC unless having increasing abdominal pain/nausea. If this occurs would repeat KUB for placement. LFT's downtrending with PTC capped. - Patient is not comfortable with drain care, will need to discharge to a facility who can manage drains until they can be removed. Anticipate several more weeks - Cont JP drains as they are continuing to drain this subhepatic fluid collection - Eliquis restarted - Will follow up with Dr. Hillery Hunter and IR as outpatient. This has been arranged.  - Primary working on d/c to SNF. Reviewed case with MD - no further w/u indicated at this time. Okay for d/c to SNF. We will see as needed over the weekend. Call with any questions or concerns.    FEN: regular VTE: Eliquis ID: [id number] per ID - switched to rocephin/flagyl    - per TRH -  A fib AKI Peripheral neuropathy GERD HLD Hx of gastric bypass     LOS: 26 days    Francisco Padilla , [facility] Surgery 08/24/2023, 11:36 AM Please see Amion for pager number during day hours 7:00am-4:30pm

## 2023-08-24 NOTE — Progress Notes (Signed)
Physical Therapy Treatment Patient Details Name: Francisco Padilla MRN: [id number] DOB: [birth year] Today's Date: 08/24/2023   History of Present Illness Pt is a 73 y/o male admitted 07/28/23 for sepsis due to acute cholecystitis and AKI. Gangrenous cholecystitis s/p lap subtotal cholecystectomy 1/8. MRCP 1/15 revealed an undrained fluid collection that the surgical drain traverses, but is unable to completely evacuate. IR placed 2 perc drains and PTC drain 1/17. 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 Pt with good participation in standing exercises this date and transfer OOB to chair with min to modA needed due to posterior instability in standing. Pt denies dizziness during session but may benefit from orthostatic vitals assessment next session if still having posterior lean sitting/standing to ensure BP stability. Pt agreeable to sit up in chair with alarm on for safety after encouragement from PTA. Pt will continue to benefit from skilled rehab in a post acute setting < 3 hours per day to maximize functional gains before returning home.    If plan is discharge home, recommend the following: Assistance with cooking/housework;Direct supervision/assist for medications management;Direct supervision/assist for financial management;Assist for transportation;Supervision due to cognitive status;Help with stairs or ramp for entrance;A lot of help with bathing/dressing/bathroom;A little help with walking and/or transfers   Can travel by private vehicle     No  Equipment Recommendations  Rolling walker (2 wheels);BSC/3in1    Recommendations for Other Services       Precautions / Restrictions Precautions Precautions: Fall Precaution Comments: 2 JP drains on R. Pleural drain on R. Chronically low BP. Restrictions Weight Bearing  Restrictions Per Provider Order: No     Mobility  Bed Mobility Overal bed mobility: Needs Assistance Bed Mobility: Rolling, Sidelying to Sit Rolling: Contact guard assist Sidelying to sit: Min assist, HOB elevated, Used rails       General bed mobility comments: Cues for log roll for comfort sitting up to R EOB from flat bed, and pt using rail when rolling/pushing up.    Transfers Overall transfer level: Needs assistance Equipment used: Rolling walker (2 wheels) Transfers: Sit to/from Stand Sit to Stand: Min assist, Mod assist, From elevated surface   Step pivot transfers: Min assist, From elevated surface       General transfer comment: Cues for improved anterior weight shift prior to attempt as pt tending to posterior lean while sitting and pt practiced anterior lean a few reps prior to transfer trials. STS x 2 from EOB with cues for sequencing/safety, up to modA for stability upon standing with noted posterior sway/imbalance with pt putting most of his weight on back heels. When cued to put more weight through his forefeet, pt needing less assist (also using RW throughout)    Ambulation/Gait Ambulation/Gait assistance: Min assist Gait Distance (Feet): 5 Feet Assistive device: Rolling walker (2 wheels) Gait Pattern/deviations: Narrow base of support, Step-to pattern, Decreased stride length Gait velocity: reduced     General Gait Details: cues for wider BOS, distance limited due to pt imbalance and no chair follow available, instead emphasis on standing exercises.   Stairs             Wheelchair Mobility     Tilt Bed    Modified Rankin (Stroke Patients Only)       Balance Overall balance assessment: Needs assistance Sitting-balance support: Feet  supported Sitting balance-Leahy Scale: Fair Sitting balance - Comments: posterior leaning with patient requiring min to CGA for sitting balance, does better with cues for "nose past your knees"   Standing  balance support: Bilateral upper extremity supported, During functional activity, Reliant on assistive device for balance Standing balance-Leahy Scale: Poor Standing balance comment: Pt uses RW and external assist due to posterior LOB multiple times                            Cognition Arousal: Alert Behavior During Therapy: WFL for tasks assessed/performed Overall Cognitive Status: Impaired/Different from baseline Area of Impairment: Safety/judgement, Memory, Problem solving                 Orientation Level:  (appears aware of self, location and situation, not further assessed)   Memory: Decreased short-term memory   Safety/Judgement: Decreased awareness of safety, Decreased awareness of deficits   Problem Solving: Slow processing, Requires verbal cues, Difficulty sequencing General Comments: Pt initially requesting not to get OOB, but with encouragement pt agreeable. Decreased insight into deficits and safe body mechanics; pt appreciative of assistance staff gave him.        Exercises General Exercises - Lower Extremity Long Arc Quad: AROM, Both, 5 reps, Seated Hip Flexion/Marching: AROM, Both, Standing, 20 reps Toe Raises: AROM, Both, 10 reps, Seated Heel Raises: AROM, Both, 10 reps, Seated Mini-Sqauts: AROM, 10 reps, Standing Other Exercises Other Exercises: STS x 3 reps    General Comments General comments (skin integrity, edema, etc.): pt received in wet bed linens, only agreeable to get OOB to chair when PTA mentions MD protocol typically requests pt to get OOB to chair daily and he has not yet done this today. No pressure relief cushion seen in his room so pillows x2 placed on seat of chair for pt comfort. Soiled linens removed from his bed after pt sitting up in chair and RN/NT notified he needs new sheets placed on his bed. external catheter in place while pt in chair      Pertinent Vitals/Pain Pain Assessment Pain Assessment: Faces Faces Pain  Scale: Hurts a little bit Pain Location: drain sites on R side Pain Descriptors / Indicators: Operative site guarding Pain Intervention(s): Monitored during session, Repositioned    Home Living                          Prior Function            PT Goals (current goals can now be found in the care plan section) Acute Rehab PT Goals Patient Stated Goal: Go Home PT Goal Formulation: With patient Time For Goal Achievement: 08/29/23 Progress towards PT goals: Progressing toward goals    Frequency    Min 1X/week      PT Plan      Co-evaluation              AM-PAC PT "6 Clicks" Mobility   Outcome Measure  Help needed turning from your back to your side while in a flat bed without using bedrails?: A Little Help needed moving from lying on your back to sitting on the side of a flat bed without using bedrails?: A Little Help needed moving to and from a bed to a chair (including a wheelchair)?: A Lot Help needed standing up from a chair using your arms (e.g., wheelchair or bedside chair)?: A Lot Help needed to walk in  hospital room?: A Little Help needed climbing 3-5 steps with a railing? : Total 6 Click Score: 14    End of Session Equipment Utilized During Treatment: Gait belt Activity Tolerance: Patient limited by fatigue;Patient tolerated treatment well Patient left: with call bell/phone within reach;in chair;with chair alarm set Nurse Communication: Mobility status;Other (comment) (pt needs new linens; assist level) PT Visit Diagnosis: Other abnormalities of gait and mobility (R26.89);Muscle weakness (generalized) (M62.81);Unsteadiness on feet (R26.81);Difficulty in walking, not elsewhere classified (R26.2)     Time: 1191-4782 PT Time Calculation (min) (ACUTE ONLY): 19 min  Charges:    $Therapeutic Exercise: 8-22 mins PT General Charges $$ ACUTE PT VISIT: 1 Visit                     Philomena Buttermore P., PTA Acute Rehabilitation Services Secure Chat  Preferred 9a-5:30pm Office: [phone]    Dorathy Kinsman [facility] 08/24/2023, 7:02 PM

## 2023-08-25 DIAGNOSIS — Z1611 Resistance to penicillins: Secondary | ICD-10-CM | POA: Diagnosis present

## 2023-08-25 DIAGNOSIS — R791 Abnormal coagulation profile: Secondary | ICD-10-CM | POA: Diagnosis not present

## 2023-08-25 DIAGNOSIS — Z7901 Long term (current) use of anticoagulants: Secondary | ICD-10-CM | POA: Diagnosis not present

## 2023-08-25 DIAGNOSIS — Z20822 Contact with and (suspected) exposure to covid-19: Secondary | ICD-10-CM | POA: Diagnosis present

## 2023-08-25 DIAGNOSIS — K819 Cholecystitis, unspecified: Secondary | ICD-10-CM | POA: Diagnosis not present

## 2023-08-25 DIAGNOSIS — K219 Gastro-esophageal reflux disease without esophagitis: Secondary | ICD-10-CM | POA: Diagnosis present

## 2023-08-25 DIAGNOSIS — I350 Nonrheumatic aortic (valve) stenosis: Secondary | ICD-10-CM | POA: Diagnosis not present

## 2023-08-25 DIAGNOSIS — B952 Enterococcus as the cause of diseases classified elsewhere: Secondary | ICD-10-CM | POA: Diagnosis present

## 2023-08-25 DIAGNOSIS — I4811 Longstanding persistent atrial fibrillation: Secondary | ICD-10-CM | POA: Diagnosis present

## 2023-08-25 DIAGNOSIS — F419 Anxiety disorder, unspecified: Secondary | ICD-10-CM | POA: Diagnosis present

## 2023-08-25 DIAGNOSIS — R0789 Other chest pain: Secondary | ICD-10-CM | POA: Diagnosis not present

## 2023-08-25 DIAGNOSIS — Z66 Do not resuscitate: Secondary | ICD-10-CM | POA: Diagnosis present

## 2023-08-25 DIAGNOSIS — F5101 Primary insomnia: Secondary | ICD-10-CM | POA: Diagnosis not present

## 2023-08-25 DIAGNOSIS — I739 Peripheral vascular disease, unspecified: Secondary | ICD-10-CM | POA: Diagnosis present

## 2023-08-25 DIAGNOSIS — N179 Acute kidney failure, unspecified: Secondary | ICD-10-CM | POA: Diagnosis not present

## 2023-08-25 DIAGNOSIS — L89150 Pressure ulcer of sacral region, unstageable: Secondary | ICD-10-CM | POA: Diagnosis present

## 2023-08-25 DIAGNOSIS — R188 Other ascites: Secondary | ICD-10-CM | POA: Diagnosis present

## 2023-08-25 DIAGNOSIS — Z5948 Other specified lack of adequate food: Secondary | ICD-10-CM | POA: Diagnosis not present

## 2023-08-25 DIAGNOSIS — I2699 Other pulmonary embolism without acute cor pulmonale: Secondary | ICD-10-CM | POA: Diagnosis present

## 2023-08-25 DIAGNOSIS — K810 Acute cholecystitis: Secondary | ICD-10-CM | POA: Diagnosis not present

## 2023-08-25 DIAGNOSIS — I10 Essential (primary) hypertension: Secondary | ICD-10-CM | POA: Diagnosis present

## 2023-08-25 DIAGNOSIS — I4892 Unspecified atrial flutter: Secondary | ICD-10-CM | POA: Diagnosis not present

## 2023-08-25 DIAGNOSIS — Z7401 Bed confinement status: Secondary | ICD-10-CM | POA: Diagnosis not present

## 2023-08-25 DIAGNOSIS — Z5986 Financial insecurity: Secondary | ICD-10-CM | POA: Diagnosis not present

## 2023-08-25 DIAGNOSIS — R6510 Systemic inflammatory response syndrome (SIRS) of non-infectious origin without acute organ dysfunction: Secondary | ICD-10-CM | POA: Diagnosis not present

## 2023-08-25 DIAGNOSIS — J90 Pleural effusion, not elsewhere classified: Secondary | ICD-10-CM | POA: Diagnosis not present

## 2023-08-25 DIAGNOSIS — K651 Peritoneal abscess: Secondary | ICD-10-CM | POA: Diagnosis present

## 2023-08-25 DIAGNOSIS — Z9981 Dependence on supplemental oxygen: Secondary | ICD-10-CM | POA: Diagnosis not present

## 2023-08-25 DIAGNOSIS — R2681 Unsteadiness on feet: Secondary | ICD-10-CM | POA: Diagnosis not present

## 2023-08-25 DIAGNOSIS — F900 Attention-deficit hyperactivity disorder, predominantly inattentive type: Secondary | ICD-10-CM | POA: Diagnosis not present

## 2023-08-25 DIAGNOSIS — I950 Idiopathic hypotension: Secondary | ICD-10-CM | POA: Diagnosis present

## 2023-08-25 DIAGNOSIS — K750 Abscess of liver: Secondary | ICD-10-CM | POA: Diagnosis not present

## 2023-08-25 DIAGNOSIS — M6281 Muscle weakness (generalized): Secondary | ICD-10-CM | POA: Diagnosis not present

## 2023-08-25 DIAGNOSIS — I959 Hypotension, unspecified: Secondary | ICD-10-CM | POA: Diagnosis not present

## 2023-08-25 DIAGNOSIS — Z48815 Encounter for surgical aftercare following surgery on the digestive system: Secondary | ICD-10-CM | POA: Diagnosis not present

## 2023-08-25 DIAGNOSIS — G4733 Obstructive sleep apnea (adult) (pediatric): Secondary | ICD-10-CM | POA: Diagnosis not present

## 2023-08-25 DIAGNOSIS — Z9884 Bariatric surgery status: Secondary | ICD-10-CM | POA: Diagnosis not present

## 2023-08-25 DIAGNOSIS — D6489 Other specified anemias: Secondary | ICD-10-CM | POA: Diagnosis present

## 2023-08-25 DIAGNOSIS — G629 Polyneuropathy, unspecified: Secondary | ICD-10-CM | POA: Diagnosis not present

## 2023-08-25 DIAGNOSIS — N2889 Other specified disorders of kidney and ureter: Secondary | ICD-10-CM | POA: Diagnosis not present

## 2023-08-25 DIAGNOSIS — K915 Postcholecystectomy syndrome: Secondary | ICD-10-CM | POA: Diagnosis not present

## 2023-08-25 DIAGNOSIS — E43 Unspecified severe protein-calorie malnutrition: Secondary | ICD-10-CM | POA: Diagnosis not present

## 2023-08-25 DIAGNOSIS — I4891 Unspecified atrial fibrillation: Secondary | ICD-10-CM | POA: Diagnosis not present

## 2023-08-25 DIAGNOSIS — T8143XA Infection following a procedure, organ and space surgical site, initial encounter: Secondary | ICD-10-CM | POA: Diagnosis present

## 2023-08-25 DIAGNOSIS — R079 Chest pain, unspecified: Secondary | ICD-10-CM | POA: Diagnosis not present

## 2023-08-25 NOTE — Plan of Care (Signed)
  Problem: Education: Goal: Knowledge of General Education information will improve Description: Including pain rating scale, medication(s)/side effects and non-pharmacologic comfort measures Outcome: Progressing   Problem: Health Behavior/Discharge Planning: Goal: Ability to manage health-related needs will improve Outcome: Progressing   Problem: Clinical Measurements: Goal: Ability to maintain clinical measurements within normal limits will improve Outcome: Progressing   Problem: Clinical Measurements: Goal: Ability to maintain clinical measurements within normal limits will improve Outcome: Progressing Goal: Will remain free from infection Outcome: Completed/Met Goal: Diagnostic test results will improve Outcome: Progressing Goal: Cardiovascular complication will be avoided Outcome: Progressing   Problem: Activity: Goal: Risk for activity intolerance will decrease Outcome: Progressing   Problem: Coping: Goal: Level of anxiety will decrease Outcome: Progressing   Problem: Pain Management: Goal: General experience of comfort will improve Outcome: Progressing   Problem: Safety: Goal: Ability to remain free from injury will improve Outcome: Progressing   Problem: Skin Integrity: Goal: Risk for impaired skin integrity will decrease Outcome: Progressing

## 2023-08-25 NOTE — Plan of Care (Signed)
  Problem: Activity: Goal: Risk for activity intolerance will decrease Outcome: Adequate for Discharge   Problem: Pain Management: Goal: General experience of comfort will improve 08/25/2023 1815 by Hampton Abbot, RN Outcome: Adequate for Discharge 08/25/2023 1455 by Hampton Abbot, RN Outcome: Progressing   Problem: Safety: Goal: Ability to remain free from injury will improve Outcome: Adequate for Discharge

## 2023-08-25 NOTE — Progress Notes (Signed)
Pt discharged to facility via [facility] in stable condition. Discharge instructions and report given to Tomasa Blase, at facility.

## 2023-08-25 NOTE — Plan of Care (Signed)
  Problem: Clinical Measurements: Goal: Ability to maintain clinical measurements within normal limits will improve Outcome: Progressing Goal: Diagnostic test results will improve Outcome: Progressing   Problem: Pain Management: Goal: General experience of comfort will improve Outcome: Progressing

## 2023-08-25 NOTE — TOC Transition Note (Signed)
Transition of Care [facility]) - Discharge Note   Patient Details  Name: Francisco Padilla MRN: [id number] Date of Birth: [birth year]  [facility]) CM/SW Contact:  Helene Kelp, LCSW Phone Number: 08/25/2023, 1:08 PM   Clinical Narrative:    CSW followed-up with the clinical team to address the patient's disposition (transfer to facility - [facility] )  This writer made contact with SNF facility representative Elige Radon Director of Admissions) and obtained facility transfer information. CSW to coordinate patient discharge transfer to the facility.   CSW met with the patient at the bedside and updated them to their current disposition and coordination efforts.   This writer followed-up the discharge process and arranged transportation by way of ([facility]) for the patient to the facility.  Trasfer date: 08/25/2023 Estimated time of discharge: 5PM  CSW updated the clinical team to the above information, and noted [facility] transport arrangements, along with the patient and natural supports.   Transfer process update: CSW contacted the pt's family/natural supports to update pt's disposition (transfer to noted SNF accepting the patient's referral) CSW arranged ambulance transport via [facility] to [facility]).   Nurse call report number [phone]) prior to discharge Room number:[id number] CSW provied the CN with discharge documentation needed for transfer to the SNF at the nrusing station.   CSW provided CN with discharge packet and information for the patient to transfer.   No other needs identified of this Clinical research associate. Please contact TOC if there is additional disposition support needed.   Final next level of care: Skilled Nursing Facility Barriers to Discharge: Continued Medical Work up   Patient Goals and CMS Choice Patient states their goals for this hospitalization and ongoing recovery are:: get better CMS Medicare.gov Compare Post Acute Care list provided to:: Patient Choice offered to  / list presented to : NA [place] ownership interest in [facility].provided to:: Patient    Discharge Placement                    Patient and family notified of of transfer: 08/25/23  Discharge Plan and Services Additional resources added to the After Visit Summary for   In-house Referral: NA Discharge Planning Services: CM Consult Post Acute Care Choice: NA          DME Arranged: N/A DME Agency: NA Date DME Agency Contacted: 08/25/23     HH Arranged: NA HH Agency: NA        Social Drivers of Health (SDOH) Interventions SDOH Screenings   Food Insecurity: No Food Insecurity (07/29/2023)  Housing: Low Risk  (07/29/2023)  Transportation Needs: No Transportation Needs (07/29/2023)  Utilities: Not At Risk (07/29/2023)  Social Connections: Patient Declined (07/31/2023)  Tobacco Use: Low Risk  (08/01/2023)     Readmission Risk Interventions     No data to display

## 2023-08-25 NOTE — Discharge Summary (Addendum)
Physician Discharge Summary  Francisco Padilla ZOX:[id number] DOB: 1950/12/04 DOA: 07/28/2023  PCP: [name], MD  Admit date: 07/28/2023 Discharge date: 08/25/2023 Admitted From: Home Disposition: SNF Recommendations for Outpatient Follow-up:  Outpatient follow-up with cardiology, general surgery, interventional radiology and ID Check CMP and CBC in 1 week Please follow up on the following pending results: None   Discharge Condition: Stable CODE STATUS: Full code  Follow-up Information     Sharlene Dory, PA-C Follow up.   Specialty: Cardiology Why: Friday Aug 31, 2023 Arrive by 8:10 AMAppt at 8:25 AM (25 min) Contact information: [address] [place] [address] [phone]         Moise Boring, MD Follow up on 08/28/2023.   Specialty: General Surgery Why: 4:00pm, Arrive 30 minutes prior to your appointment time, Please bring your insurance card and photo ID Contact information: [address] [place] [address] [phone]         Gilmer Mor, DO Follow up in 1 week(s).   Specialties: Interventional Radiology, Radiology Why: Office will call you with a follow up appointment Contact information: [address] [place] [address] [phone]         [facility] Follow up.   Specialty: Radiology Why: Schedulers will contact you for date and time of follow-up Contact information: [address] [place] [address] [phone]                Hospital course 73 year old M with PMH of OSA on CPAP, hypertension, PAF on Eliquis, PVD and bariatric surgery presented to ED on 1/5 with generalized weakness and abdominal pain and admitted with acute cholecystitis as noted on CT abdomen and pelvis.  Started on IV Zosyn.  General surgery consulted.  He underwent laparoscopic cholecystectomy on 1/8 after Eliquis washout cardiac clearance by cardiology.  He is TTE showed LVEF  of 60 to 65% with severe AS.  While in OR, patient became tachycardic and started on esmolol.  Later, he became hypotensive and started on pressor and transferred to ICU.  Patient continued to have right-sided abdominal pain.  MRI abdomen on 1/15 showed residual gallbladder lumen and soft tissue debris's in surgical bed with associated 10.8 cm fluid collection extending from the gallbladder fossa into the right perihepatic space.  Patient had percutaneous biliary drain placed on 1/17.  Fluid culture with beta-lactamase positive E. coli.  Antibiotics changed to IV meropenem on 1/20.  PTC was capped on 1/20 but he developed right-sided abdominal pain again.  Repeat CT abdomen and pelvis on 1/22 showed 5.5 x 10.5 cm abscess.  PTC was uncapped and 2 JP drains placed by IR on 1/22.  He was continued on IV meropenem.  Eventually, symptoms and pain improved.  General surgery start recapping biliary drain again on 1/27 that he tolerated since then.  Patient continued to have significant purulent output from JP drain.  ID consulted for guidance on antibiotics and gave recommendation (see below).  Therapy recommended SNF.  Medically stable for discharge.  He is cleared for discharge for outpatient follow-up by all consultants.  Patient was discharged to SNF on 08/23/2023 but appealed discharge and stayed in the hospital.  His appeal was not successful.    See individual problem list below for more.   Problems addressed during this hospitalization Sepsis due to acute gangrenous cholecystitis: -S/p subtotal cholecystectomy by Dr. Hillery Hunter on 1/8  -MRCP on 1/15: undrained  fluid collection that the surgical drain traverses but unable to completely evacuate -S/p image guided drain into the subhepatic abscess and PTC on 1/17 (Dr. Loreta Ave) -Abscess cultures on 1/17 with beta-lactamase positive for E. coli.  IV Zosyn changed to meropenem on 1/20. -ID de-escalated antibiotics to CTX and Flagyl on 1/28 and recommended Cipro and  Flagyl for 4 weeks on discharge. -PTC capped by IR on 1/20 but uncapped on 1/22 due to pain. PTC recapped on 1/27. Tolerated this for over 60 hours -Per surgery, patient can be discharged if he tolerates PTC capping for 48 hours. -Continue JP drains for the subhepatic fluid collection.  Still with significant output. -Eliquis was resumed on 1/20 anticipating no further procedures -Follow-up with IR, ID and surgery -Therapy recommended SNF   Chronic hypotension: On Solu-Cortef and midodrine at home.  TTE with severe aortic stenosis.  TSH, B12, folate and a.m. cortisol normal.  Received IV albumin.  Normotensive for most part. -Continue midodrine 15 mg 3 times daily -Increased Florinef to twice daily -Cardiology signed off.  Recommended outpatient follow-up as above   Severe aortic stenosis: Noted on TTE for preoperative evaluation. -Outpatient follow-up with cardiology.   AKI- resolved   Hyponatremia-mild.  Asymptomatic -Increased Florinef. -Recheck in 1 week   Persistent A-fib with RVR: Rate controlled without meds. -Continue eliquis   History of Roux-en-Y gastric bypass -Follow up as outpatient.    OSA on CPAP -Continue Home Cpap.    Severe malnutrition: Body mass index is 24.45 kg/m. Nutrition Problem: Severe Malnutrition Etiology: chronic illness (gastric bypass) Signs/Symptoms: severe fat depletion, severe muscle depletion Interventions: Ensure Enlive (each supplement provides 350kcal and 20 grams of protein), MVI, Carnation Instant Breakfast   Pressure skin injury Pressure Injury 08/07/23 Buttocks Right Stage 2 -  Partial thickness loss of dermis presenting as a shallow open injury with a red, pink wound bed without slough. round and red (Active)  08/07/23 2000  Location: Buttocks  Location Orientation: Right  Staging: Stage 2 -  Partial thickness loss of dermis presenting as a shallow open injury with a red, pink wound bed without slough.  Wound Description  (Comments): round and red  Present on Admission: No  Dressing Type Foam - Lift dressing to assess site every shift 08/22/23 0800     Time spent 35 minutes  Vital signs Vitals:   08/24/23 1223 08/24/23 2000 08/25/23 0410 08/25/23 0902  BP: 92/71 (!) 85/64 91/67 101/74  Pulse: 98 74 80 78  Temp: 97.8 F (36.6 C) 98.3 F (36.8 C) 97.6 F (36.4 C) 98.4 F (36.9 C)  Resp: 17 14 16 16   Height:      Weight:      SpO2: 99% 96% 97% 100%  TempSrc:  Oral Oral Oral  BMI (Calculated):         Discharge exam  GENERAL: No apparent distress.  Nontoxic. HEENT: MMM.  Vision and hearing grossly intact.  NECK: Supple.  No apparent JVD.  RESP:  No IWOB.  Fair aeration bilaterally. CVS:  RRR. Heart sounds normal.  ABD/GI/GU: BS+. Abd soft, NTND.  2 JP drains to right abdomen with with some purulent output.  Biliary drain capped.  MSK/EXT:  Moves extremities. No apparent deformity. No edema.  SKIN: no apparent skin lesion or wound NEURO: Awake, alert and oriented appropriately.  No apparent focal neuro deficit. PSYCH: Calm. Normal affect.  Discharge Instructions Discharge Instructions     Diet general   Complete by: As directed    Increase  activity slowly   Complete by: As directed    No wound care   Complete by: As directed       Allergies as of 08/25/2023       Reactions   Aspirin    Avoid due to bariatric surgery   Bupropion Other (See Comments)   "Passed out"   Nsaids    Avoid due to bariatric surgery   Oxycodone Hcl    Hallucinations, agitation         Medication List     STOP taking these medications    acetaminophen 650 MG CR tablet Commonly known as: TYLENOL Replaced by: acetaminophen 325 MG tablet   alfuzosin 10 MG 24 hr tablet Commonly known as: UROXATRAL   cetirizine 10 MG tablet Commonly known as: ZYRTEC   CO Q 10 PO   COSAMIN DS PO   fluticasone 50 MCG/ACT nasal spray Commonly known as: FLONASE   LORazepam 1 MG tablet Commonly known as:  ATIVAN   Melatonin 10 MG Tabs   OSTEO BI-FLEX ADV TRIPLE ST PO   PROBIOTIC PO   sildenafil 20 MG tablet Commonly known as: REVATIO   sodium chloride 0.65 % Soln nasal spray Commonly known as: OCEAN   SUPER B COMPLEX/C PO       TAKE these medications    acetaminophen 325 MG tablet Commonly known as: TYLENOL Take 2 tablets (650 mg total) by mouth every 4 (four) hours. Replaces: acetaminophen 650 MG CR tablet   acidophilus Caps capsule Take 1 capsule by mouth daily.   amphetamine-dextroamphetamine 5 MG tablet Commonly known as: ADDERALL Take 1 tablet (5 mg total) by mouth daily as needed (for ADD).   atorvastatin 20 MG tablet Commonly known as: LIPITOR Take 20 mg by mouth at bedtime.   ciprofloxacin 500 MG tablet Commonly known as: Cipro Take 1 tablet (500 mg total) by mouth 2 (two) times daily for 28 days.   desvenlafaxine 50 MG 24 hr tablet Commonly known as: PRISTIQ Take 50 mg by mouth daily.   Eliquis 5 MG Tabs tablet Generic drug: apixaban Take 5 mg by mouth 2 (two) times daily.   feeding supplement Liqd Take 237 mLs by mouth 2 (two) times daily between meals.   fludrocortisone 0.1 MG tablet Commonly known as: FLORINEF Take 1 tablet (0.1 mg total) by mouth 2 (two) times daily. What changed: when to take this   gabapentin 300 MG capsule Commonly known as: NEURONTIN Take 2 capsules (600 mg total) by mouth at bedtime.   latanoprost 0.005 % ophthalmic solution Commonly known as: XALATAN Place 1 drop into both eyes at bedtime.   metroNIDAZOLE 500 MG tablet Commonly known as: Flagyl Take 1 tablet (500 mg total) by mouth 2 (two) times daily for 28 days.   midodrine 5 MG tablet Commonly known as: PROAMATINE Take 1 tablet (5 mg total) by mouth 3 (three) times daily with meals.   One Daily Multivitamin Men Tabs Take 1 tablet by mouth 2 (two) times daily. CENTRUM SILVER   oxyCODONE 5 MG immediate release tablet Commonly known as: Oxy  IR/ROXICODONE Take 1 tablet (5 mg total) by mouth every 4 (four) hours as needed for moderate pain (pain score 4-6) or severe pain (pain score 7-10).   pantoprazole 40 MG tablet Commonly known as: PROTONIX Take 40 mg by mouth in the morning and at bedtime.   polyethylene glycol powder 17 GM/SCOOP powder Commonly known as: MiraLax Take 17 g by mouth 2 (two) times daily as  needed for moderate constipation.   senna-docusate 8.6-50 MG tablet Commonly known as: Senokot-S Take 1 tablet by mouth 2 (two) times daily between meals as needed for mild constipation.   sodium chloride flush 0.9 % Soln Commonly known as: NS 5 mLs by Intracatheter route every 8 (eight) hours.   tamsulosin 0.4 MG Caps capsule Commonly known as: FLOMAX Take 1 capsule (0.4 mg total) by mouth daily after breakfast.   zolpidem 5 MG tablet Commonly known as: AMBIEN Take 1 tablet (5 mg total) by mouth at bedtime. What changed:  medication strength how much to take        Consultations: General surgery Interventional cardiology Cardiology Infectious disease  Procedures/Studies: 1/17: Image guided drain into subhepatic abscess, PTC 1/20: PTC capped by IR, LFTs normal 1/22: Right-sided abdominal pain, reconnected PTC, CT abdomen   DG Abd 1 View Result Date: 08/18/2023 CLINICAL DATA:  Bile leak scratched at postoperative bile leak. Acute cholecystitis. EXAM: ABDOMEN - 1 VIEW COMPARISON:  IR procedure film 08/15/2023. CT of the abdomen and pelvis 08/15/2023. FINDINGS: Bowel gas pattern is normal.  Right hemidiaphragm remains elevated. The biliary drain is stable in position. A pigtail drainage catheter and large bore right-sided drain are stable. IMPRESSION: 1. Stable position of biliary drain. 2. Stable position of pigtail drainage catheter and large bore right-sided drain. 3. No acute abnormality. Electronically Signed   By: Marin Roberts M.D.   On: 08/18/2023 11:45   IR US Guide Bx Asp/Drain Result  Date: 08/15/2023 INDICATION: 73 year old male with history of gangrenous cholecystitis status post partial cholecystectomy necessitating percutaneous biliary drain placement and biloma drain placement on 08/10/2023. Capping trial failed with significantly increased abdominal pain and sepsis. CT abdomen pelvis demonstrates malpositioned biliary drain and perihepatic fluid. EXAM: 1. Biliary drain check and exchange. 2. Percutaneous aspiration of perihepatic fluid. MEDICATIONS: Rocephin 2 gm IV; The antibiotic was administered within an appropriate time frame prior to the initiation of the procedure. ANESTHESIA/SEDATION: None. FLUOROSCOPY TIME:  24.5 mGy COMPLICATIONS: None immediate. PROCEDURE: Informed written consent was obtained from the patient after a thorough discussion of the procedural risks, benefits and alternatives. All questions were addressed. Maximal Sterile Barrier Technique was utilized including caps, mask, sterile gowns, sterile gloves, sterile drape, hand hygiene and skin antiseptic. A timeout was performed prior to the initiation of the procedure. Procedure scout radiograph of the right upper quadrant demonstrated kinking about the perihepatic aspect of the indwelling biliary drain with the radiopaque side hole near the periphery of the liver. Subdermal Local anesthesia was provided at the planned needle entry site with 1% lidocaine. Contrast injection demonstrated patency of the drain, however there was spillage into the perihepatic space. No definite evidence of biliary ductal dilation. The external portion of the drain was cut to release inter pigtail. An Amplatz wire was then inserted. The drain was removed over the wire. A Kumpe the catheter was placed over the wire and directed into the duodenum. Hand injection of contrast via catheter demonstrated intraluminal position. The Amplatz wire was then reinserted and the 5 French catheter was exchanged for a new, 12 [place], 40 cm biliary drain. The  radiopaque marker was positioned just lateral to the hilum. The pigtail portion was formed in the duodenum. Hand injection of contrast demonstrated patency of the indwelling drain with persistent bile leak in the region of the proximal common bile duct. Ultrasound evaluation of the right upper quadrant demonstrated a small heterogeneously hypoechoic collection surrounding liver. Under direct ultrasound visualization, a 19 gauge,  7 cm Yueh catheter was directed into the collection. Aspiration was performed which showed yielded trace, thick bilious and bloody fluid. The fluid is very difficult to aspirate given gelatinous nature. The Yueh catheter was removed. The drain was affixed to the skin with an interrupted 0 silk suture. Sterile bandage was applied. The patient tolerated the procedure well was transferred back to the floor in good condition. IMPRESSION: 1. Technically successful exchange and repositioning of malpositioned indwelling 12 [place] biliary drain. 2. Attempted perihepatic fluid aspiration yielded trace thick bilious and bloody fluid. Given prior drain malposition, this fluid likely represents bilious leakage which should resolve spontaneously via indwelling drainage catheters. Marliss Coots, MD [facility] Radiology Electronically Signed   By: Marliss Coots M.D.   On: 08/15/2023 22:05   IR EXCHANGE BILIARY DRAIN Result Date: 08/15/2023 INDICATION: 73 year old male with history of gangrenous cholecystitis status post partial cholecystectomy necessitating percutaneous biliary drain placement and biloma drain placement on 08/10/2023. Capping trial failed with significantly increased abdominal pain and sepsis. CT abdomen pelvis demonstrates malpositioned biliary drain and perihepatic fluid. EXAM: 1. Biliary drain check and exchange. 2. Percutaneous aspiration of perihepatic fluid. MEDICATIONS: Rocephin 2 gm IV; The antibiotic was administered within an  appropriate time frame prior to the initiation of the procedure. ANESTHESIA/SEDATION: None. FLUOROSCOPY TIME:  24.5 mGy COMPLICATIONS: None immediate. PROCEDURE: Informed written consent was obtained from the patient after a thorough discussion of the procedural risks, benefits and alternatives. All questions were addressed. Maximal Sterile Barrier Technique was utilized including caps, mask, sterile gowns, sterile gloves, sterile drape, hand hygiene and skin antiseptic. A timeout was performed prior to the initiation of the procedure. Procedure scout radiograph of the right upper quadrant demonstrated kinking about the perihepatic aspect of the indwelling biliary drain with the radiopaque side hole near the periphery of the liver. Subdermal Local anesthesia was provided at the planned needle entry site with 1% lidocaine. Contrast injection demonstrated patency of the drain, however there was spillage into the perihepatic space. No definite evidence of biliary ductal dilation. The external portion of the drain was cut to release inter pigtail. An Amplatz wire was then inserted. The drain was removed over the wire. A Kumpe the catheter was placed over the wire and directed into the duodenum. Hand injection of contrast via catheter demonstrated intraluminal position. The Amplatz wire was then reinserted and the 5 French catheter was exchanged for a new, 12 [place], 40 cm biliary drain. The radiopaque marker was positioned just lateral to the hilum. The pigtail portion was formed in the duodenum. Hand injection of contrast demonstrated patency of the indwelling drain with persistent bile leak in the region of the proximal common bile duct. Ultrasound evaluation of the right upper quadrant demonstrated a small heterogeneously hypoechoic collection surrounding liver. Under direct ultrasound visualization, a 19 gauge, 7 cm Yueh catheter was directed into the collection. Aspiration was performed which showed yielded trace,  thick bilious and bloody fluid. The fluid is very difficult to aspirate given gelatinous nature. The Yueh catheter was removed. The drain was affixed to the skin with an interrupted 0 silk suture. Sterile bandage was applied. The patient tolerated the procedure well was transferred back to the floor in good condition. IMPRESSION: 1. Technically successful exchange and repositioning of malpositioned indwelling 12 [place] biliary drain. 2. Attempted perihepatic fluid aspiration yielded trace thick bilious and bloody fluid. Given prior drain malposition, this fluid likely represents bilious leakage which should resolve spontaneously via indwelling drainage catheters.  Marliss Coots, MD [facility] Radiology Electronically Signed   By: Marliss Coots M.D.   On: 08/15/2023 22:05   CT ABDOMEN PELVIS W CONTRAST Result Date: 08/15/2023 CLINICAL DATA:  Intra-abdominal abscess. EXAM: CT ABDOMEN AND PELVIS WITH CONTRAST TECHNIQUE: Multidetector CT imaging of the abdomen and pelvis was performed using the standard protocol following bolus administration of intravenous contrast. RADIATION DOSE REDUCTION: This exam was performed according to the departmental dose-optimization program which includes automated exposure control, adjustment of the mA and/or kV according to patient size and/or use of iterative reconstruction technique. CONTRAST:  75mL OMNIPAQUE IOHEXOL 350 MG/ML SOLN COMPARISON:  CT of the abdomen pelvis dated 07/28/2023. FINDINGS: Lower chest: Partially visualized bilateral pleural effusions, right greater than left with associated partial compressive atelectasis of the lower lobes versus pneumonia. This is new since the prior CT. There is 3 vessel coronary vascular calcification. The tip of a central venous line noted at the cavoatrial junction. Tiny pockets of air adjacent to the liver, likely introduced via catheters. There is a small ascites new or significantly  progressed since the prior CT. Hepatobiliary: Irregular liver contour suggestive of cirrhosis. Cholecystectomy. There is a 5.5 x 10.5 cm complex collection containing air at the cholecystectomy bed consistent with an abscess. Percutaneous transhepatic internal/external drain with tip in the region of the second portion of the duodenum and a percutaneous subhepatic drainage catheter with tip in the collection/abscess noted. Pancreas: The pancreas is moderately atrophic and poorly visualized. Spleen: Normal in size without focal abnormality. Adrenals/Urinary Tract: The adrenal glands unremarkable. Multiple nonobstructing bilateral renal calculi measure up to 5 mm in the interpolar right kidney. There is a 9 mm stone in the distal left ureter with mild left hydronephrosis. The right ureter and urinary bladder appear unremarkable. Stomach/Bowel: There is sigmoid diverticulosis and scattered colonic diverticula. There is postsurgical changes of gastric bypass. There is a small hiatal hernia. There is no bowel obstruction. Vascular/Lymphatic: Moderate aortoiliac atherosclerotic disease. The IVC is unremarkable. No portal venous gas. There is no adenopathy. Reproductive: The prostate and seminal vesicles are grossly unremarkable. Other: There is diffuse subcutaneous edema and anasarca. Musculoskeletal: Osteopenia with degenerative changes. No acute osseous pathology. IMPRESSION: 1. Status post cholecystectomy with a 5.5 x 10.5 cm abscess. Percutaneous drainage catheter with tip in the abscess. 2. Percutaneous transhepatic internal/external drain with tip in the region of the second portion of the duodenum. 3. Small ascites new or significantly progressed since the prior CT. 4. Partially visualized bilateral pleural effusions, right greater than left, new since the prior CT. 5. A 9 mm stone in the distal left ureter with mild left hydronephrosis. 6. Nonobstructing bilateral renal calculi. 7. Sigmoid diverticulosis. No bowel  obstruction. 8.  Aortic Atherosclerosis (ICD10-I70.0). Electronically Signed   By: Elgie Collard M.D.   On: 08/15/2023 17:08   DG Shoulder Right Result Date: 08/14/2023 CLINICAL DATA:  Right shoulder pain for several days EXAM: RIGHT SHOULDER - 2+ VIEW COMPARISON:  None Available. FINDINGS: Internal rotation, external rotation, transscapular, and axillary views of the right shoulder are obtained on 5 images. No acute fracture, subluxation, or dislocation. Mild hypertrophic changes of the acromioclavicular joint. Soft tissues are unremarkable. Continued vascular congestion within the visualized right hemithorax. Right-sided PICC partially visualized. IMPRESSION: 1. Acromioclavicular joint osteoarthritis. 2. No acute bony abnormality. Electronically Signed   By: Sharlet Salina M.D.   On: 08/14/2023 17:25   DG CHEST PORT 1 VIEW Result Date: 08/12/2023 CLINICAL DATA:  Dyspnea. EXAM: PORTABLE  CHEST 1 VIEW COMPARISON:  08/03/2023 FINDINGS: Right-sided PICC line unchanged. Lungs are hypoinflated with mild prominence of the perihilar vasculature suggesting a mild degree of vascular congestion. No lobar consolidation or effusion. Cardiomediastinal silhouette and remainder the exam is unchanged. IMPRESSION: Hypoinflation with mild vascular congestion. Electronically Signed   By: Elberta Fortis M.D.   On: 08/12/2023 12:47   IR US ABSCESS DRAIN PLACEMENT Result Date: 08/10/2023 INDICATION: 73 year old male with a history of biloma and bile leak after prior partial cholecystectomy. He presents for drainage and internal/external biliary drain. EXAM: ULTRASOUND-GUIDED DRAINAGE OF SUBHEPATIC ABSCESS IMAGE GUIDED PERCUTANEOUS TRANSHEPATIC CHOLANGIOGRAM WITH INTERNAL/EXTERNAL BILIARY DRAIN. MEDICATIONS: None ANESTHESIA/SEDATION: Moderate (conscious) sedation was employed during this procedure. A total of Versed 2.0 mg and Fentanyl 100 mcg, 1 mg Dilaudid, was administered intravenously by the radiology nurse. Total  intra-service moderate Sedation Time: 82 minutes. The patient's level of consciousness and vital signs were monitored continuously by radiology nursing throughout the procedure under my direct supervision. FLUOROSCOPY: Radiation Exposure Index (as provided by the fluoroscopic device): 387 mGy Kerma COMPLICATIONS: None PROCEDURE: The procedure, risks, benefits, and alternatives were explained to the patient and the patient's family. A complete informed consent was performed, with risk benefit analysis. Specific risks that were discussed for the procedure include bleeding, infection, biliary sepsis, IC use day, organ injury, need for further procedure, need for further surgery, long-term drain placement, cardiopulmonary collapse, death. Questions regarding the procedure were encouraged and answered. The patient understands and consents to the procedure. Patient is position in supine position on the fluoroscopy table, and the upper abdomen was prepped and draped in the usual sterile fashion. Maximum barrier sterile technique with sterile gowns and gloves were used for the procedure. A timeout was performed prior to the initiation of the procedure. Local anesthesia was provided with 1% lidocaine with epinephrine. Ultrasound survey was performed of the hilum of the liver and the subhepatic liver in the right upper quadrant. Significant gas was present in the hilum of the liver, and differentiating what may be colon versus is a gas filled collection in this area was not possible. We elected to drain the sub a Paddock collection as this appears to communicate on the preoperative MRI. 1% lidocaine was used for local anesthesia. A trocar needle was advanced into the subhepatic fluid collection with ultrasound guidance. Modified Seldinger technique was used to place a 10 French pigtail catheter drain. Immediate return of similar fluid to the surgical drain. We then proceeded with PTC. Ultrasound survey of the left liver lobe  was performed, with then ultrasound of the right liver lobe. 1% lidocaine was used for local anesthesia, with generous infiltration of the skin and subcutaneous tissues in and inter left costal location. A Chiba needle was advanced under ultrasound guidance into the right liver lobe, using ultrasound guidance in attempt to access a biliary duct. We were successful in accessing portal vein, hepatic vein, and at 1 point distal hepatic artery. Given the small size of the biliary system/decompressed biliary system ultrasound was abandoned. We then proceeded with a formal PTC attempt. The access needle was advanced from a mid axillary line, eleventh rib into the a Paddock parenchyma. A traditional pullback contrast injection technique was used. It took 3 passes of the needle before distal biliary radicles were opacified. Intermittent opacification of the biliary system using this first needle position was possible in opacifying the more central biliary ducts. A second access was then used for access into the biliary system, from intercostal location.  This second needle was placed into the more central biliary ducts using fluoroscopic guidance with multiple obliquity. Once the tip of this needle was confirmed within the more central biliary system, an 018 wire was advanced centrally. The needle was removed, a small incision was made with an 11 blade scalpel, and then a triaxial Accustick system was advanced into the biliary system. The metal stiffener and dilator were removed, we confirmed placement with contrast infusion. A coaxial Glidewire and 4 French glide cath were then used to navigate across the obstruction at the ampulla into the duodenum. Once the catheter was within the duodenum, the wire was removed and contrast confirmed location. A coon's wire was advanced through the system, and the Accustick and Glidewire were removed. Dilation of the subcutaneous tissue tracks was performed with a 10 [place] dilator, then  a 12 [place] dilator, and then a 76 [place] biliary drain was placed as an internal/external biliary drain. Small amount of contrast confirmed location. The patient tolerated the procedure well and remained hemodynamically stable throughout. No complications were encountered and no significant blood loss was encountered. IMPRESSION: Status post ultrasound-guided subhepatic abscess drainage, as well as image guided percutaneous transhepatic cholangiogram with internal/external drainage. Signed, Yvone Neu. Miachel Roux, RPVI [facility] Radiology Electronically Signed   By: Gilmer Mor D.O.   On: 08/10/2023 15:41   IR BILIARY DRAIN PLACEMENT WITH CHOLANGIOGRAM Result Date: 08/10/2023 INDICATION: 73 year old male with a history of biloma and bile leak after prior partial cholecystectomy. He presents for drainage and internal/external biliary drain. EXAM: ULTRASOUND-GUIDED DRAINAGE OF SUBHEPATIC ABSCESS IMAGE GUIDED PERCUTANEOUS TRANSHEPATIC CHOLANGIOGRAM WITH INTERNAL/EXTERNAL BILIARY DRAIN. MEDICATIONS: None ANESTHESIA/SEDATION: Moderate (conscious) sedation was employed during this procedure. A total of Versed 2.0 mg and Fentanyl 100 mcg, 1 mg Dilaudid, was administered intravenously by the radiology nurse. Total intra-service moderate Sedation Time: 82 minutes. The patient's level of consciousness and vital signs were monitored continuously by radiology nursing throughout the procedure under my direct supervision. FLUOROSCOPY: Radiation Exposure Index (as provided by the fluoroscopic device): 387 mGy Kerma COMPLICATIONS: None PROCEDURE: The procedure, risks, benefits, and alternatives were explained to the patient and the patient's family. A complete informed consent was performed, with risk benefit analysis. Specific risks that were discussed for the procedure include bleeding, infection, biliary sepsis, IC use day, organ injury, need for further procedure, need  for further surgery, long-term drain placement, cardiopulmonary collapse, death. Questions regarding the procedure were encouraged and answered. The patient understands and consents to the procedure. Patient is position in supine position on the fluoroscopy table, and the upper abdomen was prepped and draped in the usual sterile fashion. Maximum barrier sterile technique with sterile gowns and gloves were used for the procedure. A timeout was performed prior to the initiation of the procedure. Local anesthesia was provided with 1% lidocaine with epinephrine. Ultrasound survey was performed of the hilum of the liver and the subhepatic liver in the right upper quadrant. Significant gas was present in the hilum of the liver, and differentiating what may be colon versus is a gas filled collection in this area was not possible. We elected to drain the sub a Paddock collection as this appears to communicate on the preoperative MRI. 1% lidocaine was used for local anesthesia. A trocar needle was advanced into the subhepatic fluid collection with ultrasound guidance. Modified Seldinger technique was used to place a 10 French pigtail catheter drain. Immediate return of similar fluid to the surgical drain. We then proceeded  with PTC. Ultrasound survey of the left liver lobe was performed, with then ultrasound of the right liver lobe. 1% lidocaine was used for local anesthesia, with generous infiltration of the skin and subcutaneous tissues in and inter left costal location. A Chiba needle was advanced under ultrasound guidance into the right liver lobe, using ultrasound guidance in attempt to access a biliary duct. We were successful in accessing portal vein, hepatic vein, and at 1 point distal hepatic artery. Given the small size of the biliary system/decompressed biliary system ultrasound was abandoned. We then proceeded with a formal PTC attempt. The access needle was advanced from a mid axillary line, eleventh rib into  the a Paddock parenchyma. A traditional pullback contrast injection technique was used. It took 3 passes of the needle before distal biliary radicles were opacified. Intermittent opacification of the biliary system using this first needle position was possible in opacifying the more central biliary ducts. A second access was then used for access into the biliary system, from intercostal location. This second needle was placed into the more central biliary ducts using fluoroscopic guidance with multiple obliquity. Once the tip of this needle was confirmed within the more central biliary system, an 018 wire was advanced centrally. The needle was removed, a small incision was made with an 11 blade scalpel, and then a triaxial Accustick system was advanced into the biliary system. The metal stiffener and dilator were removed, we confirmed placement with contrast infusion. A coaxial Glidewire and 4 French glide cath were then used to navigate across the obstruction at the ampulla into the duodenum. Once the catheter was within the duodenum, the wire was removed and contrast confirmed location. A coon's wire was advanced through the system, and the Accustick and Glidewire were removed. Dilation of the subcutaneous tissue tracks was performed with a 10 [place] dilator, then a 12 [place] dilator, and then a 39 [place] biliary drain was placed as an internal/external biliary drain. Small amount of contrast confirmed location. The patient tolerated the procedure well and remained hemodynamically stable throughout. No complications were encountered and no significant blood loss was encountered. IMPRESSION: Status post ultrasound-guided subhepatic abscess drainage, as well as image guided percutaneous transhepatic cholangiogram with internal/external drainage. Signed, Yvone Neu. Miachel Roux, RPVI [facility] Inter[facility] Radiology [facility] Radiology Electronically Signed   By: Gilmer Mor D.O.   On:  08/10/2023 15:41   MR ABDOMEN MRCP W WO CONTAST Result Date: 08/08/2023 CLINICAL DATA:  Bile leak EXAM: MRI ABDOMEN WITHOUT AND WITH CONTRAST (INCLUDING MRCP) TECHNIQUE: Multiplanar multisequence MR imaging of the abdomen was performed both before and after the administration of intravenous contrast. Heavily T2-weighted images of the biliary and pancreatic ducts were obtained, and three-dimensional MRCP images were rendered by post processing. CONTRAST:  10mL GADAVIST GADOBUTROL 1 MMOL/ML IV SOLN COMPARISON:  CT abdomen/pelvis dated 07/29/2023. FINDINGS: Motion degraded images. Lower chest: Small bilateral pleural effusions. Hepatobiliary: Liver is within normal limits. Status post subtotal cholecystectomy with residual gallbladder lumen and soft tissue debris in the surgical bed. Associated 9.2 x 6.1 x 10.8 cm fluid collection extending from the gallbladder fossa (series 6/image 31) to the inferior right perihepatic space (series 2/image 29). No intrahepatic or extrahepatic ductal dilatation. Common duct measures 5 mm, without discontinuity on MR. No choledocholithiasis is seen. Pancreas:  Within normal limits. Spleen:  Within normal limits. Adrenals/Urinary Tract:  Adrenal glands are within normal limits. Bilateral simple renal cysts, measuring up to 4.7 cm in the anterior left lower kidney (  series 3/image 32), benign (Bosniak I). No follow-up is recommended. No hydronephrosis. Stomach/Bowel: Stomach is within normal limits. Visualized bowel is grossly unremarkable. Vascular/Lymphatic:  No evidence of aortic aneurysm. No suspicious abdominal lymphadenopathy. Other:  No abdominal ascites. Musculoskeletal: No focal osseous lesions. IMPRESSION: Status post subtotal cholecystectomy with residual gallbladder lumen and soft tissue debris in the surgical bed. Associated 10.8 cm fluid collection extending from the gallbladder fossa to the inferior right perihepatic space. No intrahepatic or extrahepatic ductal  dilatation. Common duct measures 5 mm, without discontinuity on MR. No choledocholithiasis is seen. Small bilateral pleural effusions. Electronically Signed   By: Charline Bills M.D.   On: 08/08/2023 03:10   MR 3D Recon At Scanner Result Date: 08/08/2023 CLINICAL DATA:  Bile leak EXAM: MRI ABDOMEN WITHOUT AND WITH CONTRAST (INCLUDING MRCP) TECHNIQUE: Multiplanar multisequence MR imaging of the abdomen was performed both before and after the administration of intravenous contrast. Heavily T2-weighted images of the biliary and pancreatic ducts were obtained, and three-dimensional MRCP images were rendered by post processing. CONTRAST:  10mL GADAVIST GADOBUTROL 1 MMOL/ML IV SOLN COMPARISON:  CT abdomen/pelvis dated 07/29/2023. FINDINGS: Motion degraded images. Lower chest: Small bilateral pleural effusions. Hepatobiliary: Liver is within normal limits. Status post subtotal cholecystectomy with residual gallbladder lumen and soft tissue debris in the surgical bed. Associated 9.2 x 6.1 x 10.8 cm fluid collection extending from the gallbladder fossa (series 6/image 31) to the inferior right perihepatic space (series 2/image 29). No intrahepatic or extrahepatic ductal dilatation. Common duct measures 5 mm, without discontinuity on MR. No choledocholithiasis is seen. Pancreas:  Within normal limits. Spleen:  Within normal limits. Adrenals/Urinary Tract:  Adrenal glands are within normal limits. Bilateral simple renal cysts, measuring up to 4.7 cm in the anterior left lower kidney (series 3/image 32), benign (Bosniak I). No follow-up is recommended. No hydronephrosis. Stomach/Bowel: Stomach is within normal limits. Visualized bowel is grossly unremarkable. Vascular/Lymphatic:  No evidence of aortic aneurysm. No suspicious abdominal lymphadenopathy. Other:  No abdominal ascites. Musculoskeletal: No focal osseous lesions. IMPRESSION: Status post subtotal cholecystectomy with residual gallbladder lumen and soft tissue  debris in the surgical bed. Associated 10.8 cm fluid collection extending from the gallbladder fossa to the inferior right perihepatic space. No intrahepatic or extrahepatic ductal dilatation. Common duct measures 5 mm, without discontinuity on MR. No choledocholithiasis is seen. Small bilateral pleural effusions. Electronically Signed   By: Charline Bills M.D.   On: 08/08/2023 03:10   DG CHEST PORT 1 VIEW Result Date: 08/03/2023 CLINICAL DATA:  PICC in place. EXAM: PORTABLE CHEST 1 VIEW COMPARISON:  3 hours prior FINDINGS: Tip of the right upper extremity PICC in the region of the atrial caval junction. Lower lung volumes from earlier today. Stable mediastinal contours, aortic atherosclerosis. Increasing atelectasis in the left lung base and right infrahilar region. No pneumothorax or large pleural effusion. IMPRESSION: 1. Tip of the right upper extremity PICC in the region of the atrial caval junction. 2. Lower lung volumes with increasing atelectasis in the left lung base and right infrahilar region. Electronically Signed   By: Narda Rutherford M.D.   On: 08/03/2023 20:46   DG Chest Port 1 View Result Date: 08/03/2023 CLINICAL DATA:  Line placement EXAM: PORTABLE CHEST 1 VIEW COMPARISON:  X-ray 07/28/2023.  CT 07/28/2023. FINDINGS: Placement of a right-sided PICC with the tip along the upper right atrium. Stable cardiopericardial silhouette when adjusting for level of underinflation. Calcified aorta. Increasing bandlike changes right perihilar in the left lung base, favoring atelectasis.  No pneumothorax, effusion or edema. Overlapping cardiac leads. The left inferior costophrenic angle is clipped off the edge of the film. Degenerative changes along the spine. IMPRESSION: Right-sided PICC in place with tip along the upper right atrium. Decreased inflation with some increasing atelectasis. Electronically Signed   By: Karen Kays M.D.   On: 08/03/2023 18:04   [place] EKG SITE RITE Result Date: 08/03/2023 If  Site Rite image not attached, placement could not be confirmed due to current cardiac rhythm.  LONG TERM MONITOR (3-14 DAYS) Result Date: 08/01/2023   Atrial Fibrillation occurred continuously (100% burden), ranging from 53-193 bpm (avg of 94 bpm). Patch Wear Time:  13 days and 13 hours (2024-12-17T13:14:02-499 to 2024-12-31T02:25:18-0500) 2 Ventricular Tachycardia runs occurred, the run with the fastest interval lasting 4 beats with a max rate of 164 bpm, the longest lasting 11.4 secs with an avg rate of 132 bpm. Atrial Fibrillation occurred continuously (100% burden), ranging from 53-193  bpm (avg of 94 bpm). Isolated VEs were rare (<1.0%, 2515), VE Couplets were rare (<1.0%, 67), and VE Triplets were rare (<1.0%, 5). Ventricular Bigeminy was present.   ECHOCARDIOGRAM COMPLETE Result Date: 07/31/2023    ECHOCARDIOGRAM REPORT   Patient Name:   HESTON WIDENER Date of Exam: 07/31/2023 Medical Rec #:  [id number]     Height:       76.0 in Accession #:    [id number]    Weight:       205.0 lb Date of Birth:  1950/08/12     BSA:          2.239 m Patient Age:    72 years      BP:           96/74 mmHg Patient Gender: M             HR:           100 bpm. Exam Location:  Inpatient Procedure: 2D Echo, Cardiac Doppler, Color Doppler and Intracardiac            Opacification Agent Indications:    Preoperative Evaluation  History:        Patient has prior history of Echocardiogram examinations, most                 recent 09/11/2014. CAD, Aortic Valve Disease, Arrythmias:Atrial                 Fibrillation; Signs/Symptoms:Hypotension.  Sonographer:    Webb Laws Referring Phys: [id number] SHENG L HALEY  Sonographer Comments: Suboptimal parasternal window and suboptimal apical window. IMPRESSIONS  1. The aortic valve is severely calcified in views obtained. Vmax 2.9 m/s, MG 20 mmHG, AVA 0.96 cm2, DI 0.21. LV SVI low 22 cc/m2. 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 If there are  clinical concerns for severe aortic stenosis, would recommend an aortic valve calcium score for clarification. The aortic valve is calcified. Aortic valve regurgitation is not visualized. Moderate aortic valve stenosis. Aortic valve area, by VTI measures 0.96 cm. Aortic valve mean gradient measures 20.3 mmHg. Aortic valve Vmax measures 2.93 m/s.  2. Left ventricular ejection fraction, by estimation, is 60 to 65%. The left ventricle has normal function. The left ventricle has no regional wall motion abnormalities. Indeterminate diastolic filling due to E-A fusion.  3. Right ventricular systolic function is mildly reduced. The right ventricular size is mildly enlarged. There is normal pulmonary artery systolic pressure. The estimated right ventricular systolic pressure is 30.2  mmHg.  4. Left atrial size was moderately dilated.  5. Right atrial size was mildly dilated.  6. The mitral valve is grossly normal. Trivial mitral valve regurgitation. No evidence of mitral stenosis.  7. The inferior vena cava is normal in size with greater than 50% respiratory variability, suggesting right atrial pressure of 3 mmHg. FINDINGS  Left Ventricle: Left ventricular ejection fraction, by estimation, is 60 to 65%. The left ventricle has normal function. The left ventricle has no regional wall motion abnormalities. Definity contrast agent was given IV to delineate the left ventricular  endocardial borders. The left ventricular internal cavity size was normal in size. There is no left ventricular hypertrophy. Indeterminate diastolic filling due to E-A fusion. Right Ventricle: The right ventricular size is mildly enlarged. No increase in right ventricular wall thickness. Right ventricular systolic function is mildly reduced. There is normal pulmonary artery systolic pressure. The tricuspid regurgitant velocity  is 1.95 m/s, and with an assumed right atrial pressure of 15 mmHg, the estimated right ventricular systolic pressure is 30.2  mmHg. Left Atrium: Left atrial size was moderately dilated. Right Atrium: Right atrial size was mildly dilated. Pericardium: There is no evidence of pericardial effusion. Mitral Valve: The mitral valve is grossly normal. Trivial mitral valve regurgitation. No evidence of mitral valve stenosis. Tricuspid Valve: The tricuspid valve is grossly normal. Tricuspid valve regurgitation is trivial. No evidence of tricuspid stenosis. Aortic Valve: The aortic valve is severely calcified in views obtained. Vmax 2.9 m/s, MG 20 mmHG, AVA 0.96 cm2, DI 0.21. LV SVI low 22 cc/m2. 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 If there are clinical concerns for severe aortic stenosis, would recommend an aortic valve calcium score for clarification. The aortic valve is calcified. Aortic valve regurgitation is not visualized. Moderate aortic stenosis is present. Aortic valve mean gradient measures 20.3 mmHg. Aortic valve peak gradient measures 34.3 mmHg. Aortic valve area, by VTI measures 0.96 cm. Pulmonic Valve: The pulmonic valve was grossly normal. Pulmonic valve regurgitation is not visualized. No evidence of pulmonic stenosis. Aorta: The aortic root and ascending aorta are structurally normal, with no evidence of dilitation. Venous: The inferior vena cava is normal in size with greater than 50% respiratory variability, suggesting right atrial pressure of 3 mmHg. IAS/Shunts: The atrial septum is grossly normal.  LEFT VENTRICLE PLAX 2D LVIDd:         4.70 cm      Diastology LVIDs:         3.30 cm      LV e' medial:    10.40 cm/s LV PW:         1.00 cm      LV E/e' medial:  5.8 LV IVS:        0.90 cm      LV e' lateral:   14.60 cm/s LVOT diam:     2.40 cm      LV E/e' lateral: 4.1 LV SV:         50 LV SV Index:   22 LVOT Area:     4.52 cm  LV Volumes (MOD) LV vol d, MOD A2C: 61.7 ml LV vol d, MOD A4C: 113.0 ml LV vol s, MOD A2C: 29.6 ml LV vol s, MOD A4C: 45.4 ml LV SV MOD A2C:     32.1 ml LV  SV MOD A4C:     113.0 ml LV SV MOD BP:      51.5 ml RIGHT VENTRICLE  IVC RV Basal diam:  4.20 cm    IVC diam: 3.20 cm RV Mid diam:    3.40 cm RV S prime:     9.48 cm/s TAPSE (M-mode): 1.7 cm LEFT ATRIUM            Index        RIGHT ATRIUM           Index LA diam:      5.60 cm  2.50 cm/m   RA Area:     17.50 cm LA Vol (A4C): 115.0 ml 51.37 ml/m  RA Volume:   41.50 ml  18.54 ml/m  AORTIC VALVE AV Area (Vmax):    0.99 cm AV Area (Vmean):   0.98 cm AV Area (VTI):     0.96 cm AV Vmax:           292.98 cm/s AV Vmean:          200.557 cm/s AV VTI:            0.526 m AV Peak Grad:      34.3 mmHg AV Mean Grad:      20.3 mmHg LVOT Vmax:         64.30 cm/s LVOT Vmean:        43.400 cm/s LVOT VTI:          0.111 m LVOT/AV VTI ratio: 0.21  AORTA Ao Asc diam: 3.50 cm MITRAL VALVE               TRICUSPID VALVE MV Area (PHT): 6.32 cm    TR Peak grad:   15.2 mmHg MV Decel Time: 120 msec    TR Vmax:        195.00 cm/s MV E velocity: 60.20 cm/s MV A velocity: 40.30 cm/s  SHUNTS MV E/A ratio:  1.49        Systemic VTI:  0.11 m                            Systemic Diam: 2.40 cm Lennie Odor MD Electronically signed by Lennie Odor MD Signature Date/Time: 07/31/2023/10:21:24 AM    Final    CT Chest W Contrast Result Date: 07/29/2023 CLINICAL DATA:  Sepsis, weakness, fatigue. EXAM: CT CHEST WITH CONTRAST TECHNIQUE: Multidetector CT imaging of the chest was performed during intravenous contrast administration. RADIATION DOSE REDUCTION: This exam was performed according to the departmental dose-optimization program which includes automated exposure control, adjustment of the mA and/or kV according to patient size and/or use of iterative reconstruction technique. CONTRAST:  75mL OMNIPAQUE IOHEXOL 350 MG/ML SOLN COMPARISON:  01/20/2011 FINDINGS: Cardiovascular: Heart is normal size. Aorta is normal caliber. Diffuse 3 vessel coronary artery disease. Aortic atherosclerosis. Mediastinum/Nodes: No mediastinal, hilar, or  axillary adenopathy. Trachea and esophagus are unremarkable. Thyroid unremarkable. Lungs/Pleura: Lungs are clear. No focal airspace opacities or suspicious nodules. No effusions. Upper Abdomen: See abdominal CT report Musculoskeletal: Chest wall soft tissues are unremarkable. No acute bony abnormality. IMPRESSION: No acute cardiopulmonary disease. Three vessel coronary artery disease. Aortic Atherosclerosis (ICD10-I70.0). Electronically Signed   By: Charlett Nose M.D.   On: 07/29/2023 00:15   CT Head Wo Contrast Result Date: 07/29/2023 CLINICAL DATA:  Memory loss EXAM: CT HEAD WITHOUT CONTRAST TECHNIQUE: Contiguous axial images were obtained from the base of the skull through the vertex without intravenous contrast. RADIATION DOSE REDUCTION: This exam was performed according to the departmental dose-optimization program which includes automated exposure control, adjustment of the  mA and/or kV according to patient size and/or use of iterative reconstruction technique. COMPARISON:  12/07/2022 FINDINGS: Brain: There is atrophy and chronic small vessel disease changes. No acute intracranial abnormality. Specifically, no hemorrhage, hydrocephalus, mass lesion, acute infarction, or significant intracranial injury. Vascular: No hyperdense vessel or unexpected calcification. Skull: No acute calvarial abnormality. Sinuses/Orbits: No acute findings Other: None IMPRESSION: Atrophy, chronic microvascular disease. No acute intracranial abnormality. Electronically Signed   By: Charlett Nose M.D.   On: 07/29/2023 00:14   CT ABDOMEN PELVIS W CONTRAST Result Date: 07/29/2023 CLINICAL DATA:  Weakness, fatigue.  Abdominal pain. EXAM: CT ABDOMEN AND PELVIS WITH CONTRAST TECHNIQUE: Multidetector CT imaging of the abdomen and pelvis was performed using the standard protocol following bolus administration of intravenous contrast. RADIATION DOSE REDUCTION: This exam was performed according to the departmental dose-optimization program  which includes automated exposure control, adjustment of the mA and/or kV according to patient size and/or use of iterative reconstruction technique. CONTRAST:  75mL OMNIPAQUE IOHEXOL 350 MG/ML SOLN COMPARISON:  06/06/2021 FINDINGS: Lower chest: No acute abnormality. Hepatobiliary: Multiple layering gallstones within the gallbladder. Gallbladder is distended with gallbladder wall thickening. Findings concerning for acute cholecystitis. No focal hepatic abnormality. Mild periportal edema. No biliary ductal dilatation. Pancreas: Atrophy of the pancreas. No focal abnormality or ductal dilatation. Spleen: No focal abnormality.  Normal size. Adrenals/Urinary Tract: Adrenal glands normal. Numerous bilateral nonobstructing renal stones. Bilateral renal cysts are similar prior study. No follow-up imaging recommended. No definite ureteral stones. In the left pelvis posterior to the bladder in the region of the distal left ureter. The ureter is decompressed and difficult to follows/visualized. Given the decompressed state, I favor this is likely an adjacent phlebolith. Stomach/Bowel: Prior Roux-en-Y gastric bypass. Stomach, large and small bowel grossly unremarkable. Vascular/Lymphatic: Aortoiliac atherosclerosis. No evidence of aneurysm or adenopathy. Reproductive: No visible focal abnormality. Other: No free fluid or free air. Musculoskeletal: No acute bony abnormality. IMPRESSION: Cholelithiasis. Gallbladder distension with gallbladder wall thickening and surrounding inflammation concerning for acute cholecystitis. Periportal edema can be seen with volume overload/resuscitation or inflammatory processes such as hepatitis. Bilateral nephrolithiasis.  No hydronephrosis. Prior gastric bypass.  No visible complicating feature. Aortoiliac atherosclerosis. Electronically Signed   By: Charlett Nose M.D.   On: 07/29/2023 00:13   DG Ribs Unilateral W/Chest Right Result Date: 07/28/2023 CLINICAL DATA:  Recent fall with right rib  pain, initial encounter EXAM: RIGHT RIBS AND CHEST - 3+ VIEW COMPARISON:  07/31/2022 FINDINGS: Cardiac shadow is within normal limits. Aortic calcifications are noted. The lungs are clear bilaterally. No infiltrate or pneumothorax is seen. No acute rib abnormality noted. IMPRESSION: No acute rib fracture seen. Electronically Signed   By: Alcide Clever M.D.   On: 07/28/2023 23:39       The results of significant diagnostics from this hospitalization (including imaging, microbiology, ancillary and laboratory) are listed below for reference.     Microbiology: No results found for this or any previous visit (from the past 240 hours).   Labs:  CBC: Recent Labs  Lab 08/20/23 1201 08/21/23 0505 08/22/23 0355 08/23/23 0500 08/24/23 0050  WBC 13.7* 13.6* 11.7* 11.2* 12.2*  NEUTROABS  --   --  9.4*  --   --   HGB 9.1* 8.5* 8.7* 8.9* 8.4*  HCT 27.7* 25.9* 25.9* 26.5* 25.4*  MCV 92.0 90.9 90.2 90.4 89.1  PLT 311 316 353 407* 447*   BMP &GFR Recent Labs  Lab 08/20/23 1201 08/21/23 0505 08/22/23 0355 08/23/23 0500 08/24/23 0050  NA  134* 134* 132* 132* 132*  K 4.1 4.1 4.1 4.4 4.2  CL 100 101 96* 98 96*  CO2 26 27 25 27 27   GLUCOSE 113* 102* 92 88 111*  BUN 19 15 23 23 21   CREATININE 0.66 0.57* 0.60* 0.60* 0.54*  CALCIUM 8.5* 8.6* 8.4* 8.3* 8.3*  MG  --   --  1.9 1.8  --    Estimated Creatinine Clearance: 104 mL/min (A) (by C-G formula based on SCr of 0.54 mg/dL (L)). Liver & Pancreas: Recent Labs  Lab 08/20/23 1201 08/21/23 0505 08/22/23 0355 08/23/23 0500 08/24/23 0050  AST 18 16 19 17 16   ALT 35 27 25 21 20   ALKPHOS 264* 209* 209* 183* 174*  BILITOT 1.1 1.0 0.8 0.9 0.7  PROT 5.8* 5.7* 5.6* 5.5* 5.7*  ALBUMIN 1.8* 1.8* 1.8* 1.9* 1.9*   No results for input(s): "LIPASE", "AMYLASE" in the last 168 hours. No results for input(s): "AMMONIA" in the last 168 hours. Diabetic: No results for input(s): "HGBA1C" in the last 72 hours. No results for input(s): "GLUCAP" in the  last 168 hours. Cardiac Enzymes: No results for input(s): "CKTOTAL", "CKMB", "CKMBINDEX", "TROPONINI" in the last 168 hours. No results for input(s): "PROBNP" in the last 8760 hours. Coagulation Profile: No results for input(s): "INR", "PROTIME" in the last 168 hours. Thyroid Function Tests: No results for input(s): "TSH", "T4TOTAL", "FREET4", "T3FREE", "THYROIDAB" in the last 72 hours. Lipid Profile: No results for input(s): "CHOL", "HDL", "LDLCALC", "TRIG", "CHOLHDL", "LDLDIRECT" in the last 72 hours. Anemia Panel: No results for input(s): "VITAMINB12", "FOLATE", "FERRITIN", "TIBC", "IRON", "RETICCTPCT" in the last 72 hours. Urine analysis:    Component Value Date/Time   COLORURINE AMBER BIOCHEMICALS MAY BE AFFECTED BY COLOR (A) 10/21/2009 1928   APPEARANCEUR CLEAR 10/21/2009 1928   LABSPEC 1.024 10/21/2009 1928   PHURINE 5.5 10/21/2009 1928   GLUCOSEU NEGATIVE 10/21/2009 1928   HGBUR NEGATIVE 10/21/2009 1928   BILIRUBINUR SMALL (A) 10/21/2009 1928   KETONESUR 15 (A) 10/21/2009 1928   PROTEINUR 30 (A) 10/21/2009 1928   UROBILINOGEN 1.0 10/21/2009 1928   NITRITE NEGATIVE 10/21/2009 1928   LEUKOCYTESUR TRACE (A) 10/21/2009 1928   Sepsis Labs: Invalid input(s): "PROCALCITONIN", "LACTICIDVEN"   SIGNED:  Almon Hercules, MD  [facility] 08/25/2023, 12:34 PM

## 2023-08-25 NOTE — TOC Progression Note (Signed)
Transition of Care [facility]) - Progression Note    Patient Details  Name: Francisco Padilla MRN: [id number] Date of Birth: Sep 17, 1950  [facility] CM/SW Contact  Helene Kelp, [place] Phone Number: 08/25/2023, 12:47 PM  Clinical Narrative:    CSW was updated by the members of the clinical team via chat the patient wants a SNF and preference is [facility].   CSW followed-up the disposition and completed the SNF referrals for the patient's disposition.   This wrier updated the RN to the above information.   TOC CSW disposition follow-up: Please provide the patient with referral status updates  Please obtain SNF selection once made by the patient/natural support. Please update the clinical team the patients disposition.  Please coordinate transfer once deemed medically appropriate.   No other needs identified at this current time for this Clinical research associate. Weekend CSW to follow and continue efforts and progress made to support the patient's disposition.     Expected Discharge Plan: Skilled Nursing Facility Barriers to Discharge: Continued Medical Work up  Expected Discharge Plan and Services In-house Referral: NA Discharge Planning Services: CM Consult Post Acute Care Choice: NA Living arrangements for the past 2 months: Single Family Home Expected Discharge Date: 08/25/23               DME Arranged: N/A DME Agency: NA       HH Arranged: NA           Social Determinants of Health (SDOH) Interventions SDOH Screenings   Food Insecurity: No Food Insecurity (07/29/2023)  Housing: Low Risk  (07/29/2023)  Transportation Needs: No Transportation Needs (07/29/2023)  Utilities: Not At Risk (07/29/2023)  Social Connections: Patient Declined (07/31/2023)  Tobacco Use: Low Risk  (08/01/2023)    Readmission Risk Interventions     No data to display

## 2023-08-26 DIAGNOSIS — I4891 Unspecified atrial fibrillation: Secondary | ICD-10-CM | POA: Diagnosis not present

## 2023-08-26 DIAGNOSIS — Z743 Need for continuous supervision: Secondary | ICD-10-CM | POA: Diagnosis not present

## 2023-08-26 DIAGNOSIS — Z1621 Resistance to vancomycin: Secondary | ICD-10-CM | POA: Diagnosis not present

## 2023-08-26 DIAGNOSIS — G629 Polyneuropathy, unspecified: Secondary | ICD-10-CM | POA: Diagnosis not present

## 2023-08-26 DIAGNOSIS — K750 Abscess of liver: Secondary | ICD-10-CM | POA: Diagnosis present

## 2023-08-26 DIAGNOSIS — L89150 Pressure ulcer of sacral region, unstageable: Secondary | ICD-10-CM | POA: Diagnosis present

## 2023-08-26 DIAGNOSIS — E440 Moderate protein-calorie malnutrition: Secondary | ICD-10-CM | POA: Diagnosis not present

## 2023-08-26 DIAGNOSIS — I350 Nonrheumatic aortic (valve) stenosis: Secondary | ICD-10-CM | POA: Diagnosis not present

## 2023-08-26 DIAGNOSIS — R0789 Other chest pain: Secondary | ICD-10-CM | POA: Diagnosis not present

## 2023-08-26 DIAGNOSIS — J90 Pleural effusion, not elsewhere classified: Secondary | ICD-10-CM | POA: Diagnosis not present

## 2023-08-26 DIAGNOSIS — Z9884 Bariatric surgery status: Secondary | ICD-10-CM | POA: Diagnosis not present

## 2023-08-26 DIAGNOSIS — B952 Enterococcus as the cause of diseases classified elsewhere: Secondary | ICD-10-CM | POA: Diagnosis present

## 2023-08-26 DIAGNOSIS — Z7901 Long term (current) use of anticoagulants: Secondary | ICD-10-CM | POA: Diagnosis not present

## 2023-08-26 DIAGNOSIS — Z9981 Dependence on supplemental oxygen: Secondary | ICD-10-CM | POA: Diagnosis not present

## 2023-08-26 DIAGNOSIS — D6489 Other specified anemias: Secondary | ICD-10-CM | POA: Diagnosis present

## 2023-08-26 DIAGNOSIS — R791 Abnormal coagulation profile: Secondary | ICD-10-CM | POA: Diagnosis not present

## 2023-08-26 DIAGNOSIS — R262 Difficulty in walking, not elsewhere classified: Secondary | ICD-10-CM | POA: Diagnosis not present

## 2023-08-26 DIAGNOSIS — F419 Anxiety disorder, unspecified: Secondary | ICD-10-CM | POA: Diagnosis present

## 2023-08-26 DIAGNOSIS — Z789 Other specified health status: Secondary | ICD-10-CM | POA: Diagnosis not present

## 2023-08-26 DIAGNOSIS — Z66 Do not resuscitate: Secondary | ICD-10-CM | POA: Diagnosis present

## 2023-08-26 DIAGNOSIS — T8143XA Infection following a procedure, organ and space surgical site, initial encounter: Secondary | ICD-10-CM | POA: Diagnosis present

## 2023-08-26 DIAGNOSIS — I10 Essential (primary) hypertension: Secondary | ICD-10-CM | POA: Diagnosis present

## 2023-08-26 DIAGNOSIS — N2889 Other specified disorders of kidney and ureter: Secondary | ICD-10-CM | POA: Diagnosis not present

## 2023-08-26 DIAGNOSIS — M6281 Muscle weakness (generalized): Secondary | ICD-10-CM | POA: Diagnosis not present

## 2023-08-26 DIAGNOSIS — Z48815 Encounter for surgical aftercare following surgery on the digestive system: Secondary | ICD-10-CM | POA: Diagnosis not present

## 2023-08-26 DIAGNOSIS — Z5948 Other specified lack of adequate food: Secondary | ICD-10-CM | POA: Diagnosis not present

## 2023-08-26 DIAGNOSIS — Z20822 Contact with and (suspected) exposure to covid-19: Secondary | ICD-10-CM | POA: Diagnosis present

## 2023-08-26 DIAGNOSIS — I4811 Longstanding persistent atrial fibrillation: Secondary | ICD-10-CM | POA: Diagnosis present

## 2023-08-26 DIAGNOSIS — I950 Idiopathic hypotension: Secondary | ICD-10-CM | POA: Diagnosis present

## 2023-08-26 DIAGNOSIS — F900 Attention-deficit hyperactivity disorder, predominantly inattentive type: Secondary | ICD-10-CM | POA: Diagnosis not present

## 2023-08-26 DIAGNOSIS — G4733 Obstructive sleep apnea (adult) (pediatric): Secondary | ICD-10-CM | POA: Diagnosis not present

## 2023-08-26 DIAGNOSIS — Z5986 Financial insecurity: Secondary | ICD-10-CM | POA: Diagnosis not present

## 2023-08-26 DIAGNOSIS — R188 Other ascites: Secondary | ICD-10-CM | POA: Diagnosis present

## 2023-08-26 DIAGNOSIS — K832 Perforation of bile duct: Secondary | ICD-10-CM | POA: Diagnosis not present

## 2023-08-26 DIAGNOSIS — K915 Postcholecystectomy syndrome: Secondary | ICD-10-CM | POA: Diagnosis not present

## 2023-08-26 DIAGNOSIS — G609 Hereditary and idiopathic neuropathy, unspecified: Secondary | ICD-10-CM | POA: Diagnosis not present

## 2023-08-26 DIAGNOSIS — F5101 Primary insomnia: Secondary | ICD-10-CM | POA: Diagnosis not present

## 2023-08-26 DIAGNOSIS — D649 Anemia, unspecified: Secondary | ICD-10-CM | POA: Diagnosis not present

## 2023-08-26 DIAGNOSIS — I739 Peripheral vascular disease, unspecified: Secondary | ICD-10-CM | POA: Diagnosis present

## 2023-08-26 DIAGNOSIS — I2699 Other pulmonary embolism without acute cor pulmonale: Secondary | ICD-10-CM | POA: Diagnosis present

## 2023-08-26 DIAGNOSIS — I959 Hypotension, unspecified: Secondary | ICD-10-CM | POA: Diagnosis not present

## 2023-08-26 DIAGNOSIS — G4700 Insomnia, unspecified: Secondary | ICD-10-CM | POA: Diagnosis not present

## 2023-08-26 DIAGNOSIS — R1312 Dysphagia, oropharyngeal phase: Secondary | ICD-10-CM | POA: Diagnosis not present

## 2023-08-26 DIAGNOSIS — I4892 Unspecified atrial flutter: Secondary | ICD-10-CM | POA: Diagnosis not present

## 2023-08-26 DIAGNOSIS — Z452 Encounter for adjustment and management of vascular access device: Secondary | ICD-10-CM | POA: Diagnosis not present

## 2023-08-26 DIAGNOSIS — Z1611 Resistance to penicillins: Secondary | ICD-10-CM | POA: Diagnosis present

## 2023-08-26 DIAGNOSIS — K219 Gastro-esophageal reflux disease without esophagitis: Secondary | ICD-10-CM | POA: Diagnosis present

## 2023-08-26 DIAGNOSIS — K810 Acute cholecystitis: Secondary | ICD-10-CM | POA: Diagnosis not present

## 2023-08-26 DIAGNOSIS — L89153 Pressure ulcer of sacral region, stage 3: Secondary | ICD-10-CM | POA: Diagnosis not present

## 2023-08-26 DIAGNOSIS — R079 Chest pain, unspecified: Secondary | ICD-10-CM | POA: Diagnosis not present

## 2023-08-26 DIAGNOSIS — K651 Peritoneal abscess: Secondary | ICD-10-CM | POA: Diagnosis present

## 2023-08-27 ENCOUNTER — Other Ambulatory Visit: Payer: Self-pay | Admitting: General Surgery

## 2023-08-27 DIAGNOSIS — Z9884 Bariatric surgery status: Secondary | ICD-10-CM | POA: Diagnosis not present

## 2023-08-27 DIAGNOSIS — Z452 Encounter for adjustment and management of vascular access device: Secondary | ICD-10-CM | POA: Diagnosis not present

## 2023-08-27 DIAGNOSIS — K839 Disease of biliary tract, unspecified: Secondary | ICD-10-CM

## 2023-08-27 DIAGNOSIS — R188 Other ascites: Secondary | ICD-10-CM | POA: Diagnosis present

## 2023-08-27 DIAGNOSIS — K750 Abscess of liver: Secondary | ICD-10-CM | POA: Diagnosis present

## 2023-08-27 DIAGNOSIS — Z5948 Other specified lack of adequate food: Secondary | ICD-10-CM | POA: Diagnosis not present

## 2023-08-27 DIAGNOSIS — M6281 Muscle weakness (generalized): Secondary | ICD-10-CM | POA: Diagnosis not present

## 2023-08-27 DIAGNOSIS — I10 Essential (primary) hypertension: Secondary | ICD-10-CM | POA: Diagnosis present

## 2023-08-27 DIAGNOSIS — L89153 Pressure ulcer of sacral region, stage 3: Secondary | ICD-10-CM | POA: Diagnosis not present

## 2023-08-27 DIAGNOSIS — F419 Anxiety disorder, unspecified: Secondary | ICD-10-CM | POA: Diagnosis present

## 2023-08-27 DIAGNOSIS — F900 Attention-deficit hyperactivity disorder, predominantly inattentive type: Secondary | ICD-10-CM | POA: Diagnosis not present

## 2023-08-27 DIAGNOSIS — D6489 Other specified anemias: Secondary | ICD-10-CM | POA: Diagnosis present

## 2023-08-27 DIAGNOSIS — I350 Nonrheumatic aortic (valve) stenosis: Secondary | ICD-10-CM | POA: Diagnosis present

## 2023-08-27 DIAGNOSIS — G4700 Insomnia, unspecified: Secondary | ICD-10-CM | POA: Diagnosis not present

## 2023-08-27 DIAGNOSIS — I4811 Longstanding persistent atrial fibrillation: Secondary | ICD-10-CM | POA: Diagnosis present

## 2023-08-27 DIAGNOSIS — Z1611 Resistance to penicillins: Secondary | ICD-10-CM | POA: Diagnosis present

## 2023-08-27 DIAGNOSIS — K651 Peritoneal abscess: Secondary | ICD-10-CM | POA: Diagnosis present

## 2023-08-27 DIAGNOSIS — R1312 Dysphagia, oropharyngeal phase: Secondary | ICD-10-CM | POA: Diagnosis not present

## 2023-08-27 DIAGNOSIS — Z7901 Long term (current) use of anticoagulants: Secondary | ICD-10-CM | POA: Diagnosis not present

## 2023-08-27 DIAGNOSIS — Z1621 Resistance to vancomycin: Secondary | ICD-10-CM | POA: Diagnosis not present

## 2023-08-27 DIAGNOSIS — Z743 Need for continuous supervision: Secondary | ICD-10-CM | POA: Diagnosis not present

## 2023-08-27 DIAGNOSIS — K832 Perforation of bile duct: Secondary | ICD-10-CM | POA: Diagnosis not present

## 2023-08-27 DIAGNOSIS — I4891 Unspecified atrial fibrillation: Secondary | ICD-10-CM | POA: Diagnosis not present

## 2023-08-27 DIAGNOSIS — T8143XA Infection following a procedure, organ and space surgical site, initial encounter: Secondary | ICD-10-CM | POA: Diagnosis present

## 2023-08-27 DIAGNOSIS — F5101 Primary insomnia: Secondary | ICD-10-CM | POA: Diagnosis not present

## 2023-08-27 DIAGNOSIS — R262 Difficulty in walking, not elsewhere classified: Secondary | ICD-10-CM | POA: Diagnosis not present

## 2023-08-27 DIAGNOSIS — B952 Enterococcus as the cause of diseases classified elsewhere: Secondary | ICD-10-CM | POA: Diagnosis present

## 2023-08-27 DIAGNOSIS — I950 Idiopathic hypotension: Secondary | ICD-10-CM | POA: Diagnosis present

## 2023-08-27 DIAGNOSIS — K219 Gastro-esophageal reflux disease without esophagitis: Secondary | ICD-10-CM | POA: Diagnosis present

## 2023-08-27 DIAGNOSIS — Z9981 Dependence on supplemental oxygen: Secondary | ICD-10-CM | POA: Diagnosis not present

## 2023-08-27 DIAGNOSIS — G4733 Obstructive sleep apnea (adult) (pediatric): Secondary | ICD-10-CM | POA: Diagnosis present

## 2023-08-27 DIAGNOSIS — D649 Anemia, unspecified: Secondary | ICD-10-CM | POA: Diagnosis not present

## 2023-08-27 DIAGNOSIS — G609 Hereditary and idiopathic neuropathy, unspecified: Secondary | ICD-10-CM | POA: Diagnosis not present

## 2023-08-27 DIAGNOSIS — I959 Hypotension, unspecified: Secondary | ICD-10-CM | POA: Diagnosis not present

## 2023-08-27 DIAGNOSIS — Z66 Do not resuscitate: Secondary | ICD-10-CM | POA: Diagnosis present

## 2023-08-27 DIAGNOSIS — G629 Polyneuropathy, unspecified: Secondary | ICD-10-CM | POA: Diagnosis not present

## 2023-08-27 DIAGNOSIS — Z789 Other specified health status: Secondary | ICD-10-CM | POA: Diagnosis not present

## 2023-08-27 DIAGNOSIS — R0789 Other chest pain: Secondary | ICD-10-CM | POA: Diagnosis not present

## 2023-08-27 DIAGNOSIS — Z20822 Contact with and (suspected) exposure to covid-19: Secondary | ICD-10-CM | POA: Diagnosis present

## 2023-08-27 DIAGNOSIS — I2699 Other pulmonary embolism without acute cor pulmonale: Secondary | ICD-10-CM | POA: Diagnosis present

## 2023-08-27 DIAGNOSIS — R079 Chest pain, unspecified: Secondary | ICD-10-CM | POA: Diagnosis not present

## 2023-08-27 DIAGNOSIS — E440 Moderate protein-calorie malnutrition: Secondary | ICD-10-CM | POA: Diagnosis not present

## 2023-08-27 DIAGNOSIS — I4892 Unspecified atrial flutter: Secondary | ICD-10-CM | POA: Diagnosis not present

## 2023-08-27 DIAGNOSIS — Z5986 Financial insecurity: Secondary | ICD-10-CM | POA: Diagnosis not present

## 2023-08-27 DIAGNOSIS — K810 Acute cholecystitis: Secondary | ICD-10-CM | POA: Diagnosis not present

## 2023-08-27 DIAGNOSIS — L89150 Pressure ulcer of sacral region, unstageable: Secondary | ICD-10-CM | POA: Diagnosis present

## 2023-08-27 DIAGNOSIS — I739 Peripheral vascular disease, unspecified: Secondary | ICD-10-CM | POA: Diagnosis present

## 2023-08-28 DIAGNOSIS — I4891 Unspecified atrial fibrillation: Secondary | ICD-10-CM | POA: Diagnosis not present

## 2023-08-28 DIAGNOSIS — R0789 Other chest pain: Secondary | ICD-10-CM | POA: Diagnosis not present

## 2023-08-29 DIAGNOSIS — R0789 Other chest pain: Secondary | ICD-10-CM | POA: Diagnosis not present

## 2023-08-30 ENCOUNTER — Ambulatory Visit: Payer: Medicare Other | Admitting: Podiatry

## 2023-08-30 DIAGNOSIS — K750 Abscess of liver: Secondary | ICD-10-CM | POA: Diagnosis not present

## 2023-08-30 DIAGNOSIS — L89150 Pressure ulcer of sacral region, unstageable: Secondary | ICD-10-CM | POA: Diagnosis not present

## 2023-08-30 DIAGNOSIS — R0789 Other chest pain: Secondary | ICD-10-CM | POA: Diagnosis not present

## 2023-08-30 DIAGNOSIS — K651 Peritoneal abscess: Secondary | ICD-10-CM | POA: Diagnosis not present

## 2023-08-30 DIAGNOSIS — E440 Moderate protein-calorie malnutrition: Secondary | ICD-10-CM | POA: Diagnosis not present

## 2023-08-31 ENCOUNTER — Ambulatory Visit: Payer: Medicare Other | Admitting: Physician Assistant

## 2023-08-31 DIAGNOSIS — K651 Peritoneal abscess: Secondary | ICD-10-CM | POA: Diagnosis not present

## 2023-08-31 DIAGNOSIS — K832 Perforation of bile duct: Secondary | ICD-10-CM | POA: Diagnosis not present

## 2023-08-31 DIAGNOSIS — R0789 Other chest pain: Secondary | ICD-10-CM | POA: Diagnosis not present

## 2023-08-31 DIAGNOSIS — Z1621 Resistance to vancomycin: Secondary | ICD-10-CM | POA: Diagnosis not present

## 2023-08-31 DIAGNOSIS — B952 Enterococcus as the cause of diseases classified elsewhere: Secondary | ICD-10-CM | POA: Diagnosis not present

## 2023-09-01 DIAGNOSIS — Z789 Other specified health status: Secondary | ICD-10-CM | POA: Diagnosis not present

## 2023-09-01 DIAGNOSIS — R0789 Other chest pain: Secondary | ICD-10-CM | POA: Diagnosis not present

## 2023-09-02 DIAGNOSIS — R0789 Other chest pain: Secondary | ICD-10-CM | POA: Diagnosis not present

## 2023-09-03 ENCOUNTER — Telehealth: Payer: Self-pay

## 2023-09-03 DIAGNOSIS — Z1621 Resistance to vancomycin: Secondary | ICD-10-CM | POA: Diagnosis not present

## 2023-09-03 DIAGNOSIS — R0789 Other chest pain: Secondary | ICD-10-CM | POA: Diagnosis not present

## 2023-09-03 DIAGNOSIS — R7889 Finding of other specified substances, not normally found in blood: Secondary | ICD-10-CM | POA: Diagnosis not present

## 2023-09-03 DIAGNOSIS — I4811 Longstanding persistent atrial fibrillation: Secondary | ICD-10-CM | POA: Diagnosis not present

## 2023-09-03 DIAGNOSIS — Z79899 Other long term (current) drug therapy: Secondary | ICD-10-CM | POA: Diagnosis not present

## 2023-09-03 DIAGNOSIS — I4892 Unspecified atrial flutter: Secondary | ICD-10-CM | POA: Diagnosis not present

## 2023-09-03 DIAGNOSIS — F900 Attention-deficit hyperactivity disorder, predominantly inattentive type: Secondary | ICD-10-CM | POA: Diagnosis not present

## 2023-09-03 DIAGNOSIS — N151 Renal and perinephric abscess: Secondary | ICD-10-CM | POA: Diagnosis not present

## 2023-09-03 DIAGNOSIS — K632 Fistula of intestine: Secondary | ICD-10-CM | POA: Diagnosis not present

## 2023-09-03 DIAGNOSIS — N281 Cyst of kidney, acquired: Secondary | ICD-10-CM | POA: Diagnosis not present

## 2023-09-03 DIAGNOSIS — K82A1 Gangrene of gallbladder in cholecystitis: Secondary | ICD-10-CM | POA: Diagnosis not present

## 2023-09-03 DIAGNOSIS — Z7189 Other specified counseling: Secondary | ICD-10-CM | POA: Diagnosis not present

## 2023-09-03 DIAGNOSIS — I350 Nonrheumatic aortic (valve) stenosis: Secondary | ICD-10-CM | POA: Diagnosis not present

## 2023-09-03 DIAGNOSIS — K838 Other specified diseases of biliary tract: Secondary | ICD-10-CM | POA: Diagnosis not present

## 2023-09-03 DIAGNOSIS — F419 Anxiety disorder, unspecified: Secondary | ICD-10-CM | POA: Diagnosis not present

## 2023-09-03 DIAGNOSIS — R531 Weakness: Secondary | ICD-10-CM | POA: Diagnosis not present

## 2023-09-03 DIAGNOSIS — K651 Peritoneal abscess: Secondary | ICD-10-CM | POA: Diagnosis not present

## 2023-09-03 DIAGNOSIS — I9589 Other hypotension: Secondary | ICD-10-CM | POA: Diagnosis not present

## 2023-09-03 DIAGNOSIS — R262 Difficulty in walking, not elsewhere classified: Secondary | ICD-10-CM | POA: Diagnosis not present

## 2023-09-03 DIAGNOSIS — R1084 Generalized abdominal pain: Secondary | ICD-10-CM | POA: Diagnosis not present

## 2023-09-03 DIAGNOSIS — R5381 Other malaise: Secondary | ICD-10-CM | POA: Diagnosis not present

## 2023-09-03 DIAGNOSIS — T8143XA Infection following a procedure, organ and space surgical site, initial encounter: Secondary | ICD-10-CM | POA: Diagnosis not present

## 2023-09-03 DIAGNOSIS — D649 Anemia, unspecified: Secondary | ICD-10-CM | POA: Diagnosis not present

## 2023-09-03 DIAGNOSIS — F411 Generalized anxiety disorder: Secondary | ICD-10-CM | POA: Diagnosis not present

## 2023-09-03 DIAGNOSIS — Z66 Do not resuscitate: Secondary | ICD-10-CM | POA: Diagnosis not present

## 2023-09-03 DIAGNOSIS — H4322 Crystalline deposits in vitreous body, left eye: Secondary | ICD-10-CM | POA: Diagnosis not present

## 2023-09-03 DIAGNOSIS — Z886 Allergy status to analgesic agent status: Secondary | ICD-10-CM | POA: Diagnosis not present

## 2023-09-03 DIAGNOSIS — N200 Calculus of kidney: Secondary | ICD-10-CM | POA: Diagnosis not present

## 2023-09-03 DIAGNOSIS — G8918 Other acute postprocedural pain: Secondary | ICD-10-CM | POA: Diagnosis not present

## 2023-09-03 DIAGNOSIS — L89159 Pressure ulcer of sacral region, unspecified stage: Secondary | ICD-10-CM | POA: Diagnosis not present

## 2023-09-03 DIAGNOSIS — G4733 Obstructive sleep apnea (adult) (pediatric): Secondary | ICD-10-CM | POA: Diagnosis not present

## 2023-09-03 DIAGNOSIS — G629 Polyneuropathy, unspecified: Secondary | ICD-10-CM | POA: Diagnosis not present

## 2023-09-03 DIAGNOSIS — R5383 Other fatigue: Secondary | ICD-10-CM | POA: Diagnosis not present

## 2023-09-03 DIAGNOSIS — K750 Abscess of liver: Secondary | ICD-10-CM | POA: Diagnosis not present

## 2023-09-03 DIAGNOSIS — R1312 Dysphagia, oropharyngeal phase: Secondary | ICD-10-CM | POA: Diagnosis not present

## 2023-09-03 DIAGNOSIS — R799 Abnormal finding of blood chemistry, unspecified: Secondary | ICD-10-CM | POA: Diagnosis not present

## 2023-09-03 DIAGNOSIS — B952 Enterococcus as the cause of diseases classified elsewhere: Secondary | ICD-10-CM | POA: Diagnosis not present

## 2023-09-03 DIAGNOSIS — Z452 Encounter for adjustment and management of vascular access device: Secondary | ICD-10-CM | POA: Diagnosis not present

## 2023-09-03 DIAGNOSIS — R079 Chest pain, unspecified: Secondary | ICD-10-CM | POA: Diagnosis not present

## 2023-09-03 DIAGNOSIS — F988 Other specified behavioral and emotional disorders with onset usually occurring in childhood and adolescence: Secondary | ICD-10-CM | POA: Diagnosis not present

## 2023-09-03 DIAGNOSIS — Z23 Encounter for immunization: Secondary | ICD-10-CM | POA: Diagnosis not present

## 2023-09-03 DIAGNOSIS — F5101 Primary insomnia: Secondary | ICD-10-CM | POA: Diagnosis not present

## 2023-09-03 DIAGNOSIS — Z9884 Bariatric surgery status: Secondary | ICD-10-CM | POA: Diagnosis not present

## 2023-09-03 DIAGNOSIS — H16223 Keratoconjunctivitis sicca, not specified as Sjogren's, bilateral: Secondary | ICD-10-CM | POA: Diagnosis not present

## 2023-09-03 DIAGNOSIS — M6281 Muscle weakness (generalized): Secondary | ICD-10-CM | POA: Diagnosis not present

## 2023-09-03 DIAGNOSIS — I959 Hypotension, unspecified: Secondary | ICD-10-CM | POA: Diagnosis not present

## 2023-09-03 DIAGNOSIS — Z4682 Encounter for fitting and adjustment of non-vascular catheter: Secondary | ICD-10-CM | POA: Diagnosis not present

## 2023-09-03 DIAGNOSIS — Z743 Need for continuous supervision: Secondary | ICD-10-CM | POA: Diagnosis not present

## 2023-09-03 DIAGNOSIS — G4700 Insomnia, unspecified: Secondary | ICD-10-CM | POA: Diagnosis not present

## 2023-09-03 DIAGNOSIS — F332 Major depressive disorder, recurrent severe without psychotic features: Secondary | ICD-10-CM | POA: Diagnosis not present

## 2023-09-03 DIAGNOSIS — L89153 Pressure ulcer of sacral region, stage 3: Secondary | ICD-10-CM | POA: Diagnosis not present

## 2023-09-03 DIAGNOSIS — I4891 Unspecified atrial fibrillation: Secondary | ICD-10-CM | POA: Diagnosis not present

## 2023-09-03 DIAGNOSIS — Z09 Encounter for follow-up examination after completed treatment for conditions other than malignant neoplasm: Secondary | ICD-10-CM | POA: Diagnosis not present

## 2023-09-03 DIAGNOSIS — Z9049 Acquired absence of other specified parts of digestive tract: Secondary | ICD-10-CM | POA: Diagnosis not present

## 2023-09-03 DIAGNOSIS — Z792 Long term (current) use of antibiotics: Secondary | ICD-10-CM | POA: Diagnosis not present

## 2023-09-03 DIAGNOSIS — A499 Bacterial infection, unspecified: Secondary | ICD-10-CM | POA: Diagnosis not present

## 2023-09-03 DIAGNOSIS — Z888 Allergy status to other drugs, medicaments and biological substances status: Secondary | ICD-10-CM | POA: Diagnosis not present

## 2023-09-03 DIAGNOSIS — Z885 Allergy status to narcotic agent status: Secondary | ICD-10-CM | POA: Diagnosis not present

## 2023-09-03 DIAGNOSIS — Z7901 Long term (current) use of anticoagulants: Secondary | ICD-10-CM | POA: Diagnosis not present

## 2023-09-03 DIAGNOSIS — H18513 Endothelial corneal dystrophy, bilateral: Secondary | ICD-10-CM | POA: Diagnosis not present

## 2023-09-03 DIAGNOSIS — G609 Hereditary and idiopathic neuropathy, unspecified: Secondary | ICD-10-CM | POA: Diagnosis not present

## 2023-09-03 DIAGNOSIS — R0602 Shortness of breath: Secondary | ICD-10-CM | POA: Diagnosis not present

## 2023-09-03 DIAGNOSIS — H401132 Primary open-angle glaucoma, bilateral, moderate stage: Secondary | ICD-10-CM | POA: Diagnosis not present

## 2023-09-03 NOTE — Telephone Encounter (Signed)
 Patient called with concerns about tomorrow's appointment, he does not know what it's for. 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 He states he's currently admitted at [facility] and would prefer to follow with them.   Spoke with him and his advocate and recommended they arrange follow up with [facility] prior to discharge. He would also like his MyChart account deactivated.   Francisco Padilla D Mylea Roarty, RN

## 2023-09-04 ENCOUNTER — Inpatient Hospital Stay: Payer: Self-pay | Admitting: Infectious Diseases

## 2023-09-04 DIAGNOSIS — I4811 Longstanding persistent atrial fibrillation: Secondary | ICD-10-CM | POA: Diagnosis not present

## 2023-09-04 DIAGNOSIS — K750 Abscess of liver: Secondary | ICD-10-CM | POA: Diagnosis not present

## 2023-09-04 DIAGNOSIS — K651 Peritoneal abscess: Secondary | ICD-10-CM | POA: Diagnosis not present

## 2023-09-04 DIAGNOSIS — T8143XA Infection following a procedure, organ and space surgical site, initial encounter: Secondary | ICD-10-CM | POA: Diagnosis not present

## 2023-09-05 ENCOUNTER — Other Ambulatory Visit: Payer: Medicare Other

## 2023-09-05 ENCOUNTER — Encounter: Payer: Self-pay | Admitting: *Deleted

## 2023-09-07 DIAGNOSIS — Z7189 Other specified counseling: Secondary | ICD-10-CM | POA: Diagnosis not present

## 2023-09-07 DIAGNOSIS — F419 Anxiety disorder, unspecified: Secondary | ICD-10-CM | POA: Diagnosis not present

## 2023-09-07 DIAGNOSIS — K750 Abscess of liver: Secondary | ICD-10-CM | POA: Diagnosis not present

## 2023-09-07 DIAGNOSIS — N151 Renal and perinephric abscess: Secondary | ICD-10-CM | POA: Diagnosis not present

## 2023-09-08 DIAGNOSIS — F411 Generalized anxiety disorder: Secondary | ICD-10-CM | POA: Diagnosis not present

## 2023-09-08 DIAGNOSIS — F332 Major depressive disorder, recurrent severe without psychotic features: Secondary | ICD-10-CM | POA: Diagnosis not present

## 2023-09-10 ENCOUNTER — Telehealth (HOSPITAL_COMMUNITY): Payer: Self-pay

## 2023-09-10 NOTE — Telephone Encounter (Signed)
Called to schedule drain study, no answer, left vm. AB (ct does not require auth)

## 2023-09-11 ENCOUNTER — Telehealth (HOSPITAL_COMMUNITY): Payer: Self-pay

## 2023-09-11 DIAGNOSIS — I4811 Longstanding persistent atrial fibrillation: Secondary | ICD-10-CM | POA: Diagnosis not present

## 2023-09-11 DIAGNOSIS — K651 Peritoneal abscess: Secondary | ICD-10-CM | POA: Diagnosis not present

## 2023-09-11 DIAGNOSIS — T8143XA Infection following a procedure, organ and space surgical site, initial encounter: Secondary | ICD-10-CM | POA: Diagnosis not present

## 2023-09-11 DIAGNOSIS — K750 Abscess of liver: Secondary | ICD-10-CM | POA: Diagnosis not present

## 2023-09-11 NOTE — Telephone Encounter (Signed)
Called to schedule drain f/u. Pt does not want to schedule. He says that he had 3 new drains put in at [facility] and they will be managing his drain f/u's moving forward. AB

## 2023-09-14 DIAGNOSIS — T8143XA Infection following a procedure, organ and space surgical site, initial encounter: Secondary | ICD-10-CM | POA: Diagnosis not present

## 2023-09-14 DIAGNOSIS — K750 Abscess of liver: Secondary | ICD-10-CM | POA: Diagnosis not present

## 2023-09-14 DIAGNOSIS — K651 Peritoneal abscess: Secondary | ICD-10-CM | POA: Diagnosis not present

## 2023-09-15 DIAGNOSIS — F411 Generalized anxiety disorder: Secondary | ICD-10-CM | POA: Diagnosis not present

## 2023-09-15 DIAGNOSIS — F332 Major depressive disorder, recurrent severe without psychotic features: Secondary | ICD-10-CM | POA: Diagnosis not present

## 2023-09-18 DIAGNOSIS — R1084 Generalized abdominal pain: Secondary | ICD-10-CM | POA: Diagnosis not present

## 2023-09-18 DIAGNOSIS — R799 Abnormal finding of blood chemistry, unspecified: Secondary | ICD-10-CM | POA: Diagnosis not present

## 2023-09-18 DIAGNOSIS — N200 Calculus of kidney: Secondary | ICD-10-CM | POA: Diagnosis not present

## 2023-09-18 DIAGNOSIS — R0602 Shortness of breath: Secondary | ICD-10-CM | POA: Diagnosis not present

## 2023-09-18 DIAGNOSIS — R531 Weakness: Secondary | ICD-10-CM | POA: Diagnosis not present

## 2023-09-18 DIAGNOSIS — K750 Abscess of liver: Secondary | ICD-10-CM | POA: Diagnosis not present

## 2023-09-18 DIAGNOSIS — Z7901 Long term (current) use of anticoagulants: Secondary | ICD-10-CM | POA: Diagnosis not present

## 2023-09-18 DIAGNOSIS — Z9049 Acquired absence of other specified parts of digestive tract: Secondary | ICD-10-CM | POA: Diagnosis not present

## 2023-09-18 DIAGNOSIS — Z885 Allergy status to narcotic agent status: Secondary | ICD-10-CM | POA: Diagnosis not present

## 2023-09-18 DIAGNOSIS — R5383 Other fatigue: Secondary | ICD-10-CM | POA: Diagnosis not present

## 2023-09-18 DIAGNOSIS — Z9884 Bariatric surgery status: Secondary | ICD-10-CM | POA: Diagnosis not present

## 2023-09-18 DIAGNOSIS — Z886 Allergy status to analgesic agent status: Secondary | ICD-10-CM | POA: Diagnosis not present

## 2023-09-18 DIAGNOSIS — I4811 Longstanding persistent atrial fibrillation: Secondary | ICD-10-CM | POA: Diagnosis not present

## 2023-09-18 DIAGNOSIS — G8918 Other acute postprocedural pain: Secondary | ICD-10-CM | POA: Diagnosis not present

## 2023-09-18 DIAGNOSIS — D649 Anemia, unspecified: Secondary | ICD-10-CM | POA: Diagnosis not present

## 2023-09-18 DIAGNOSIS — Z888 Allergy status to other drugs, medicaments and biological substances status: Secondary | ICD-10-CM | POA: Diagnosis not present

## 2023-09-21 DIAGNOSIS — M6281 Muscle weakness (generalized): Secondary | ICD-10-CM | POA: Diagnosis not present

## 2023-09-21 DIAGNOSIS — D649 Anemia, unspecified: Secondary | ICD-10-CM | POA: Diagnosis not present

## 2023-09-21 DIAGNOSIS — L89153 Pressure ulcer of sacral region, stage 3: Secondary | ICD-10-CM | POA: Diagnosis not present

## 2023-09-21 DIAGNOSIS — G629 Polyneuropathy, unspecified: Secondary | ICD-10-CM | POA: Diagnosis not present

## 2023-09-21 DIAGNOSIS — K750 Abscess of liver: Secondary | ICD-10-CM | POA: Diagnosis not present

## 2023-09-22 DIAGNOSIS — F411 Generalized anxiety disorder: Secondary | ICD-10-CM | POA: Diagnosis not present

## 2023-09-22 DIAGNOSIS — F332 Major depressive disorder, recurrent severe without psychotic features: Secondary | ICD-10-CM | POA: Diagnosis not present

## 2023-09-24 DIAGNOSIS — K651 Peritoneal abscess: Secondary | ICD-10-CM | POA: Diagnosis not present

## 2023-09-24 DIAGNOSIS — A499 Bacterial infection, unspecified: Secondary | ICD-10-CM | POA: Diagnosis not present

## 2023-09-24 DIAGNOSIS — Z792 Long term (current) use of antibiotics: Secondary | ICD-10-CM | POA: Diagnosis not present

## 2023-09-24 DIAGNOSIS — Z09 Encounter for follow-up examination after completed treatment for conditions other than malignant neoplasm: Secondary | ICD-10-CM | POA: Diagnosis not present

## 2023-09-25 DIAGNOSIS — K750 Abscess of liver: Secondary | ICD-10-CM | POA: Diagnosis not present

## 2023-09-25 DIAGNOSIS — I4811 Longstanding persistent atrial fibrillation: Secondary | ICD-10-CM | POA: Diagnosis not present

## 2023-09-28 DIAGNOSIS — D649 Anemia, unspecified: Secondary | ICD-10-CM | POA: Diagnosis not present

## 2023-09-28 DIAGNOSIS — M6281 Muscle weakness (generalized): Secondary | ICD-10-CM | POA: Diagnosis not present

## 2023-09-28 DIAGNOSIS — L89153 Pressure ulcer of sacral region, stage 3: Secondary | ICD-10-CM | POA: Diagnosis not present

## 2023-09-28 DIAGNOSIS — G629 Polyneuropathy, unspecified: Secondary | ICD-10-CM | POA: Diagnosis not present

## 2023-09-29 DIAGNOSIS — F411 Generalized anxiety disorder: Secondary | ICD-10-CM | POA: Diagnosis not present

## 2023-09-29 DIAGNOSIS — F332 Major depressive disorder, recurrent severe without psychotic features: Secondary | ICD-10-CM | POA: Diagnosis not present

## 2023-10-02 DIAGNOSIS — I4811 Longstanding persistent atrial fibrillation: Secondary | ICD-10-CM | POA: Diagnosis not present

## 2023-10-02 DIAGNOSIS — K750 Abscess of liver: Secondary | ICD-10-CM | POA: Diagnosis not present

## 2023-10-05 DIAGNOSIS — L89153 Pressure ulcer of sacral region, stage 3: Secondary | ICD-10-CM | POA: Diagnosis not present

## 2023-10-05 DIAGNOSIS — D649 Anemia, unspecified: Secondary | ICD-10-CM | POA: Diagnosis not present

## 2023-10-05 DIAGNOSIS — M6281 Muscle weakness (generalized): Secondary | ICD-10-CM | POA: Diagnosis not present

## 2023-10-05 DIAGNOSIS — G629 Polyneuropathy, unspecified: Secondary | ICD-10-CM | POA: Diagnosis not present

## 2023-10-06 DIAGNOSIS — K810 Acute cholecystitis: Secondary | ICD-10-CM

## 2023-10-08 DIAGNOSIS — K750 Abscess of liver: Secondary | ICD-10-CM | POA: Diagnosis not present

## 2023-10-08 DIAGNOSIS — K838 Other specified diseases of biliary tract: Secondary | ICD-10-CM | POA: Diagnosis not present

## 2023-10-08 DIAGNOSIS — Z9049 Acquired absence of other specified parts of digestive tract: Secondary | ICD-10-CM | POA: Diagnosis not present

## 2023-10-08 DIAGNOSIS — F988 Other specified behavioral and emotional disorders with onset usually occurring in childhood and adolescence: Secondary | ICD-10-CM | POA: Diagnosis not present

## 2023-10-08 DIAGNOSIS — G629 Polyneuropathy, unspecified: Secondary | ICD-10-CM | POA: Diagnosis not present

## 2023-10-08 DIAGNOSIS — G4700 Insomnia, unspecified: Secondary | ICD-10-CM | POA: Diagnosis not present

## 2023-10-08 DIAGNOSIS — R5381 Other malaise: Secondary | ICD-10-CM | POA: Diagnosis not present

## 2023-10-08 DIAGNOSIS — Z4682 Encounter for fitting and adjustment of non-vascular catheter: Secondary | ICD-10-CM | POA: Diagnosis not present

## 2023-10-08 DIAGNOSIS — I4891 Unspecified atrial fibrillation: Secondary | ICD-10-CM | POA: Diagnosis not present

## 2023-10-08 DIAGNOSIS — F411 Generalized anxiety disorder: Secondary | ICD-10-CM | POA: Diagnosis not present

## 2023-10-08 DIAGNOSIS — K82A1 Gangrene of gallbladder in cholecystitis: Secondary | ICD-10-CM | POA: Diagnosis not present

## 2023-10-08 DIAGNOSIS — Z79899 Other long term (current) drug therapy: Secondary | ICD-10-CM | POA: Diagnosis not present

## 2023-10-08 DIAGNOSIS — G4733 Obstructive sleep apnea (adult) (pediatric): Secondary | ICD-10-CM | POA: Diagnosis not present

## 2023-10-08 DIAGNOSIS — Z66 Do not resuscitate: Secondary | ICD-10-CM | POA: Diagnosis not present

## 2023-10-08 DIAGNOSIS — K651 Peritoneal abscess: Secondary | ICD-10-CM | POA: Diagnosis not present

## 2023-10-08 DIAGNOSIS — I9589 Other hypotension: Secondary | ICD-10-CM | POA: Diagnosis not present

## 2023-10-08 DIAGNOSIS — Z7901 Long term (current) use of anticoagulants: Secondary | ICD-10-CM | POA: Diagnosis not present

## 2023-10-08 DIAGNOSIS — L89159 Pressure ulcer of sacral region, unspecified stage: Secondary | ICD-10-CM | POA: Diagnosis not present

## 2023-10-09 DIAGNOSIS — I4811 Longstanding persistent atrial fibrillation: Secondary | ICD-10-CM | POA: Diagnosis not present

## 2023-10-09 DIAGNOSIS — K750 Abscess of liver: Secondary | ICD-10-CM | POA: Diagnosis not present

## 2023-10-09 DIAGNOSIS — K632 Fistula of intestine: Secondary | ICD-10-CM | POA: Diagnosis not present

## 2023-10-12 DIAGNOSIS — L89153 Pressure ulcer of sacral region, stage 3: Secondary | ICD-10-CM | POA: Diagnosis not present

## 2023-10-12 DIAGNOSIS — M6281 Muscle weakness (generalized): Secondary | ICD-10-CM | POA: Diagnosis not present

## 2023-10-12 DIAGNOSIS — D649 Anemia, unspecified: Secondary | ICD-10-CM | POA: Diagnosis not present

## 2023-10-12 DIAGNOSIS — G629 Polyneuropathy, unspecified: Secondary | ICD-10-CM | POA: Diagnosis not present

## 2023-10-18 DIAGNOSIS — A499 Bacterial infection, unspecified: Secondary | ICD-10-CM | POA: Diagnosis not present

## 2023-10-18 DIAGNOSIS — Z792 Long term (current) use of antibiotics: Secondary | ICD-10-CM | POA: Diagnosis not present

## 2023-10-18 DIAGNOSIS — K651 Peritoneal abscess: Secondary | ICD-10-CM | POA: Diagnosis not present

## 2023-10-26 DIAGNOSIS — D649 Anemia, unspecified: Secondary | ICD-10-CM | POA: Diagnosis not present

## 2023-10-26 DIAGNOSIS — L89153 Pressure ulcer of sacral region, stage 3: Secondary | ICD-10-CM | POA: Diagnosis not present

## 2023-10-26 DIAGNOSIS — M6281 Muscle weakness (generalized): Secondary | ICD-10-CM | POA: Diagnosis not present

## 2023-10-26 DIAGNOSIS — G629 Polyneuropathy, unspecified: Secondary | ICD-10-CM | POA: Diagnosis not present

## 2023-10-27 DIAGNOSIS — F332 Major depressive disorder, recurrent severe without psychotic features: Secondary | ICD-10-CM | POA: Diagnosis not present

## 2023-10-27 DIAGNOSIS — F411 Generalized anxiety disorder: Secondary | ICD-10-CM | POA: Diagnosis not present

## 2023-11-01 DIAGNOSIS — K750 Abscess of liver: Secondary | ICD-10-CM | POA: Diagnosis not present

## 2023-11-01 DIAGNOSIS — K651 Peritoneal abscess: Secondary | ICD-10-CM | POA: Diagnosis not present

## 2023-11-01 DIAGNOSIS — Z09 Encounter for follow-up examination after completed treatment for conditions other than malignant neoplasm: Secondary | ICD-10-CM | POA: Diagnosis not present

## 2023-11-01 DIAGNOSIS — I959 Hypotension, unspecified: Secondary | ICD-10-CM | POA: Diagnosis not present

## 2023-11-01 DIAGNOSIS — Z792 Long term (current) use of antibiotics: Secondary | ICD-10-CM | POA: Diagnosis not present

## 2023-11-01 DIAGNOSIS — A499 Bacterial infection, unspecified: Secondary | ICD-10-CM | POA: Diagnosis not present

## 2023-11-03 ENCOUNTER — Other Ambulatory Visit: Payer: Self-pay | Admitting: Urology

## 2023-11-04 DIAGNOSIS — Z23 Encounter for immunization: Secondary | ICD-10-CM | POA: Diagnosis not present

## 2023-11-08 DIAGNOSIS — A499 Bacterial infection, unspecified: Secondary | ICD-10-CM | POA: Diagnosis not present

## 2023-11-08 DIAGNOSIS — K750 Abscess of liver: Secondary | ICD-10-CM | POA: Diagnosis not present

## 2023-11-08 DIAGNOSIS — N281 Cyst of kidney, acquired: Secondary | ICD-10-CM | POA: Diagnosis not present

## 2023-11-09 DIAGNOSIS — L89153 Pressure ulcer of sacral region, stage 3: Secondary | ICD-10-CM | POA: Diagnosis not present

## 2023-11-09 DIAGNOSIS — M6281 Muscle weakness (generalized): Secondary | ICD-10-CM | POA: Diagnosis not present

## 2023-11-09 DIAGNOSIS — D649 Anemia, unspecified: Secondary | ICD-10-CM | POA: Diagnosis not present

## 2023-11-09 DIAGNOSIS — G629 Polyneuropathy, unspecified: Secondary | ICD-10-CM | POA: Diagnosis not present

## 2023-11-12 DIAGNOSIS — K750 Abscess of liver: Secondary | ICD-10-CM | POA: Diagnosis not present

## 2023-11-12 DIAGNOSIS — B952 Enterococcus as the cause of diseases classified elsewhere: Secondary | ICD-10-CM | POA: Diagnosis not present

## 2023-11-12 DIAGNOSIS — Z792 Long term (current) use of antibiotics: Secondary | ICD-10-CM | POA: Diagnosis not present

## 2023-11-12 DIAGNOSIS — Z1621 Resistance to vancomycin: Secondary | ICD-10-CM | POA: Diagnosis not present

## 2023-11-15 ENCOUNTER — Ambulatory Visit: Payer: BLUE CROSS/BLUE SHIELD | Admitting: Neurology

## 2023-11-19 DIAGNOSIS — H401132 Primary open-angle glaucoma, bilateral, moderate stage: Secondary | ICD-10-CM | POA: Diagnosis not present

## 2023-11-19 DIAGNOSIS — H16223 Keratoconjunctivitis sicca, not specified as Sjogren's, bilateral: Secondary | ICD-10-CM | POA: Diagnosis not present

## 2023-11-19 DIAGNOSIS — H18513 Endothelial corneal dystrophy, bilateral: Secondary | ICD-10-CM | POA: Diagnosis not present

## 2023-11-19 DIAGNOSIS — H4322 Crystalline deposits in vitreous body, left eye: Secondary | ICD-10-CM | POA: Diagnosis not present

## 2023-11-26 DIAGNOSIS — F332 Major depressive disorder, recurrent severe without psychotic features: Secondary | ICD-10-CM | POA: Diagnosis not present

## 2023-11-26 DIAGNOSIS — F411 Generalized anxiety disorder: Secondary | ICD-10-CM | POA: Diagnosis not present

## 2023-11-29 DIAGNOSIS — R6889 Other general symptoms and signs: Secondary | ICD-10-CM | POA: Diagnosis not present

## 2023-11-29 DIAGNOSIS — Z9049 Acquired absence of other specified parts of digestive tract: Secondary | ICD-10-CM | POA: Diagnosis not present

## 2023-12-05 DIAGNOSIS — F332 Major depressive disorder, recurrent severe without psychotic features: Secondary | ICD-10-CM | POA: Diagnosis not present

## 2023-12-05 DIAGNOSIS — F411 Generalized anxiety disorder: Secondary | ICD-10-CM | POA: Diagnosis not present

## 2023-12-10 DIAGNOSIS — G4733 Obstructive sleep apnea (adult) (pediatric): Secondary | ICD-10-CM | POA: Diagnosis not present

## 2023-12-10 DIAGNOSIS — I10 Essential (primary) hypertension: Secondary | ICD-10-CM | POA: Diagnosis not present

## 2023-12-11 DIAGNOSIS — A499 Bacterial infection, unspecified: Secondary | ICD-10-CM | POA: Diagnosis not present

## 2023-12-11 DIAGNOSIS — K750 Abscess of liver: Secondary | ICD-10-CM | POA: Diagnosis not present

## 2023-12-12 DIAGNOSIS — H401132 Primary open-angle glaucoma, bilateral, moderate stage: Secondary | ICD-10-CM | POA: Diagnosis not present

## 2023-12-13 DIAGNOSIS — Z4682 Encounter for fitting and adjustment of non-vascular catheter: Secondary | ICD-10-CM | POA: Diagnosis not present

## 2023-12-13 DIAGNOSIS — K651 Peritoneal abscess: Secondary | ICD-10-CM | POA: Diagnosis not present

## 2023-12-21 DIAGNOSIS — K810 Acute cholecystitis: Secondary | ICD-10-CM | POA: Diagnosis not present

## 2023-12-31 ENCOUNTER — Telehealth

## 2023-12-31 NOTE — Telephone Encounter (Signed)
 Copied from CRM [id number]. Topic: General - Deceased Patient >> 2024-01-15 11:47 AM Madelyne Schiff wrote: Name of caller: Flossie Hussar   Date of death: 11-Jan-2024   Name of funeral home: [facility]   Phone number of funeral home: [phone]  Provider that needs to sign form: Dr Tye Gall, MD   Timeline for signing:  preparing [name] for cremation will submit to the [facility] system, if you would confirm to signing certificate.  Please advise.

## 2023-12-31 NOTE — Telephone Encounter (Signed)
 I called Jared with [employer] and shared ManX's response:  Mast, Man X, NP  You12 minutes ago (1:34 PM)    The patient was under [facility] care when he was expired. thanks   Flossie Hussar stated that he called [facility], Dr.Shada prior to calling our office and they informed him to call us .   Below is the contact information for Dr.Shada: Sindy Dues, MD (Attending) NPI: [id number] [phone] (Work) [phone] [facility]) [address] [place], [place] [address] Geriatric Medicine

## 2024-01-01 NOTE — Telephone Encounter (Signed)
 ManX signed death certificate

## 2024-01-22 DEATH — deceased
